# Patient Record
Sex: Female | Born: 1935 | Race: White | Hispanic: No | Smoking: Never smoker
Health system: Southern US, Community
[De-identification: ages and names within clinical notes are randomized; demographics above are authoritative.]

## PROBLEM LIST (undated history)

## (undated) DIAGNOSIS — R0989 Other specified symptoms and signs involving the circulatory and respiratory systems: Secondary | ICD-10-CM

## (undated) DIAGNOSIS — I4719 Other supraventricular tachycardia: Secondary | ICD-10-CM

## (undated) DIAGNOSIS — I5030 Unspecified diastolic (congestive) heart failure: Secondary | ICD-10-CM

## (undated) DIAGNOSIS — K219 Gastro-esophageal reflux disease without esophagitis: Secondary | ICD-10-CM

## (undated) DIAGNOSIS — E7800 Pure hypercholesterolemia, unspecified: Secondary | ICD-10-CM

## (undated) DIAGNOSIS — E039 Hypothyroidism, unspecified: Secondary | ICD-10-CM

## (undated) DIAGNOSIS — I509 Heart failure, unspecified: Secondary | ICD-10-CM

## (undated) DIAGNOSIS — I471 Supraventricular tachycardia: Secondary | ICD-10-CM

## (undated) DIAGNOSIS — I10 Essential (primary) hypertension: Secondary | ICD-10-CM

## (undated) DIAGNOSIS — H9190 Unspecified hearing loss, unspecified ear: Secondary | ICD-10-CM

## (undated) DIAGNOSIS — IMO0001 Reserved for inherently not codable concepts without codable children: Secondary | ICD-10-CM

## (undated) DIAGNOSIS — I739 Peripheral vascular disease, unspecified: Secondary | ICD-10-CM

## (undated) DIAGNOSIS — I495 Sick sinus syndrome: Secondary | ICD-10-CM

## (undated) HISTORY — PX: TOTAL KNEE ARTHROPLASTY: SHX125

## (undated) HISTORY — DX: Hypothyroidism, unspecified: E03.9

## (undated) HISTORY — PX: OTHER SURGICAL HISTORY: SHX169

## (undated) HISTORY — DX: Gastro-esophageal reflux disease without esophagitis: K21.9

## (undated) HISTORY — DX: Peripheral vascular disease, unspecified: I73.9

## (undated) HISTORY — DX: Pure hypercholesterolemia, unspecified: E78.00

## (undated) HISTORY — DX: Supraventricular tachycardia: I47.1

## (undated) HISTORY — DX: Reserved for inherently not codable concepts without codable children: IMO0001

## (undated) HISTORY — DX: Unspecified diastolic (congestive) heart failure: I50.30

## (undated) HISTORY — DX: Other supraventricular tachycardia: I47.19

## (undated) HISTORY — DX: Other specified symptoms and signs involving the circulatory and respiratory systems: R09.89

## (undated) HISTORY — DX: Essential (primary) hypertension: I10

## (undated) HISTORY — DX: Unspecified hearing loss, unspecified ear: H91.90

---

## 1998-01-31 ENCOUNTER — Other Ambulatory Visit: Admission: RE | Admit: 1998-01-31 | Discharge: 1998-01-31 | Payer: Self-pay | Admitting: Gynecology

## 1999-07-09 ENCOUNTER — Other Ambulatory Visit: Admission: RE | Admit: 1999-07-09 | Discharge: 1999-07-09 | Payer: Self-pay | Admitting: Gynecology

## 1999-12-31 ENCOUNTER — Other Ambulatory Visit: Admission: RE | Admit: 1999-12-31 | Discharge: 1999-12-31 | Payer: Self-pay | Admitting: Family Medicine

## 2000-03-18 ENCOUNTER — Encounter: Payer: Self-pay | Admitting: Family Medicine

## 2000-03-18 ENCOUNTER — Encounter: Admission: RE | Admit: 2000-03-18 | Discharge: 2000-03-18 | Payer: Self-pay | Admitting: Family Medicine

## 2000-05-30 ENCOUNTER — Ambulatory Visit (HOSPITAL_COMMUNITY): Admission: RE | Admit: 2000-05-30 | Discharge: 2000-05-30 | Payer: Self-pay | Admitting: Gynecology

## 2000-05-30 ENCOUNTER — Encounter (INDEPENDENT_AMBULATORY_CARE_PROVIDER_SITE_OTHER): Payer: Self-pay | Admitting: Specialist

## 2000-11-18 ENCOUNTER — Other Ambulatory Visit: Admission: RE | Admit: 2000-11-18 | Discharge: 2000-11-18 | Payer: Self-pay | Admitting: Gynecology

## 2001-04-20 ENCOUNTER — Encounter: Admission: RE | Admit: 2001-04-20 | Discharge: 2001-04-20 | Payer: Self-pay | Admitting: Family Medicine

## 2001-04-20 ENCOUNTER — Encounter: Payer: Self-pay | Admitting: Family Medicine

## 2001-10-30 ENCOUNTER — Encounter: Admission: RE | Admit: 2001-10-30 | Discharge: 2001-10-30 | Payer: Self-pay | Admitting: Family Medicine

## 2001-10-30 ENCOUNTER — Encounter: Payer: Self-pay | Admitting: Family Medicine

## 2002-01-05 ENCOUNTER — Other Ambulatory Visit: Admission: RE | Admit: 2002-01-05 | Discharge: 2002-01-05 | Payer: Self-pay | Admitting: Gynecology

## 2002-12-29 ENCOUNTER — Other Ambulatory Visit: Admission: RE | Admit: 2002-12-29 | Discharge: 2002-12-29 | Payer: Self-pay | Admitting: Gynecology

## 2004-09-27 ENCOUNTER — Inpatient Hospital Stay (HOSPITAL_COMMUNITY): Admission: EM | Admit: 2004-09-27 | Discharge: 2004-09-28 | Payer: Self-pay | Admitting: Emergency Medicine

## 2004-10-29 ENCOUNTER — Encounter: Admission: RE | Admit: 2004-10-29 | Discharge: 2004-10-29 | Payer: Self-pay | Admitting: Family Medicine

## 2005-01-02 ENCOUNTER — Other Ambulatory Visit: Admission: RE | Admit: 2005-01-02 | Discharge: 2005-01-02 | Payer: Self-pay | Admitting: Gynecology

## 2006-01-21 ENCOUNTER — Encounter: Admission: RE | Admit: 2006-01-21 | Discharge: 2006-01-21 | Payer: Self-pay | Admitting: Family Medicine

## 2006-02-05 ENCOUNTER — Other Ambulatory Visit: Admission: RE | Admit: 2006-02-05 | Discharge: 2006-02-05 | Payer: Self-pay | Admitting: Gynecology

## 2006-07-15 ENCOUNTER — Encounter: Payer: Self-pay | Admitting: Vascular Surgery

## 2006-07-15 ENCOUNTER — Ambulatory Visit (HOSPITAL_COMMUNITY): Admission: RE | Admit: 2006-07-15 | Discharge: 2006-07-15 | Payer: Self-pay | Admitting: Family Medicine

## 2007-01-23 ENCOUNTER — Encounter: Admission: RE | Admit: 2007-01-23 | Discharge: 2007-01-23 | Payer: Self-pay | Admitting: Family Medicine

## 2007-02-06 ENCOUNTER — Encounter: Admission: RE | Admit: 2007-02-06 | Discharge: 2007-02-06 | Payer: Self-pay | Admitting: Family Medicine

## 2007-02-25 ENCOUNTER — Other Ambulatory Visit: Admission: RE | Admit: 2007-02-25 | Discharge: 2007-02-25 | Payer: Self-pay | Admitting: Gynecology

## 2008-03-18 ENCOUNTER — Encounter: Admission: RE | Admit: 2008-03-18 | Discharge: 2008-03-18 | Payer: Self-pay | Admitting: Family Medicine

## 2008-07-12 ENCOUNTER — Ambulatory Visit: Payer: Self-pay | Admitting: Vascular Surgery

## 2009-10-13 ENCOUNTER — Ambulatory Visit: Payer: Self-pay | Admitting: Vascular Surgery

## 2010-09-07 ENCOUNTER — Ambulatory Visit: Payer: Self-pay | Admitting: Cardiology

## 2011-01-10 ENCOUNTER — Encounter: Payer: Self-pay | Admitting: Cardiology

## 2011-01-10 DIAGNOSIS — R0989 Other specified symptoms and signs involving the circulatory and respiratory systems: Secondary | ICD-10-CM | POA: Insufficient documentation

## 2011-01-10 DIAGNOSIS — E119 Type 2 diabetes mellitus without complications: Secondary | ICD-10-CM | POA: Insufficient documentation

## 2011-01-10 DIAGNOSIS — E039 Hypothyroidism, unspecified: Secondary | ICD-10-CM | POA: Insufficient documentation

## 2011-01-10 DIAGNOSIS — K219 Gastro-esophageal reflux disease without esophagitis: Secondary | ICD-10-CM | POA: Insufficient documentation

## 2011-01-10 DIAGNOSIS — I10 Essential (primary) hypertension: Secondary | ICD-10-CM | POA: Insufficient documentation

## 2011-01-10 DIAGNOSIS — E7800 Pure hypercholesterolemia, unspecified: Secondary | ICD-10-CM | POA: Insufficient documentation

## 2011-01-10 DIAGNOSIS — H409 Unspecified glaucoma: Secondary | ICD-10-CM | POA: Insufficient documentation

## 2011-01-10 DIAGNOSIS — H9190 Unspecified hearing loss, unspecified ear: Secondary | ICD-10-CM | POA: Insufficient documentation

## 2011-01-13 ENCOUNTER — Encounter: Payer: Self-pay | Admitting: Family Medicine

## 2011-01-25 ENCOUNTER — Other Ambulatory Visit: Payer: Self-pay | Admitting: Family Medicine

## 2011-01-25 DIAGNOSIS — Z780 Asymptomatic menopausal state: Secondary | ICD-10-CM

## 2011-01-25 DIAGNOSIS — Z1239 Encounter for other screening for malignant neoplasm of breast: Secondary | ICD-10-CM

## 2011-01-28 ENCOUNTER — Ambulatory Visit
Admission: RE | Admit: 2011-01-28 | Discharge: 2011-01-28 | Disposition: A | Discharge status: Home / Self Care | Payer: MEDICARE | Source: Ambulatory Visit | Attending: Family Medicine | Admitting: Family Medicine

## 2011-01-28 DIAGNOSIS — Z1239 Encounter for other screening for malignant neoplasm of breast: Secondary | ICD-10-CM

## 2011-01-28 DIAGNOSIS — Z780 Asymptomatic menopausal state: Secondary | ICD-10-CM

## 2011-03-15 ENCOUNTER — Encounter: Payer: Self-pay | Admitting: Cardiology

## 2011-03-15 ENCOUNTER — Ambulatory Visit (INDEPENDENT_AMBULATORY_CARE_PROVIDER_SITE_OTHER): Payer: MEDICARE | Admitting: Cardiology

## 2011-03-15 DIAGNOSIS — E119 Type 2 diabetes mellitus without complications: Secondary | ICD-10-CM

## 2011-03-15 DIAGNOSIS — I10 Essential (primary) hypertension: Secondary | ICD-10-CM

## 2011-03-15 DIAGNOSIS — R0989 Other specified symptoms and signs involving the circulatory and respiratory systems: Secondary | ICD-10-CM

## 2011-03-15 DIAGNOSIS — E7800 Pure hypercholesterolemia, unspecified: Secondary | ICD-10-CM

## 2011-03-15 NOTE — Assessment & Plan Note (Signed)
Continue Lipitor therapy and a heart healthy diet.

## 2011-03-15 NOTE — Progress Notes (Signed)
HPI Veronica Young is seen for followup of her hypertension today. She brings extensive readings from home dating back to January which demonstrated excellent blood pressure control with typical readings running from [id number] systolic. Diastolic readings have been between 53 and 64. Last month she states she could no longer afford Tekturna and she was switched to clonidine 0.1 mg b.i.d. She is tolerating this medication very wel lwithout side effects.. Allergies  Allergen Reactions  . Amlodipine     Current Outpatient Prescriptions on File Prior to Visit  Medication Sig Dispense Refill  . aspirin 81 MG tablet Take 81 mg by mouth daily.        [name] atorvastatin (LIPITOR) 40 MG tablet Take 40 mg by mouth daily.        . Calcium Carbonate-Vitamin D (CALCIUM 600 + D PO) Take 600 mg by mouth daily.        [name] latanoprost (XALATAN) 0.005 % ophthalmic solution 1 drop at bedtime.        [name] levothyroxine (SYNTHROID, LEVOTHROID) 50 MCG tablet Take 50 mcg by mouth daily.        . metoprolol (TOPROL-XL) 50 MG 24 hr tablet Take 50 mg by mouth daily.        . Multiple Vitamin (MULTIVITAMIN) tablet Take 1 tablet by mouth daily.        . valsartan-hydrochlorothiazide (DIOVAN-HCT) 320-25 MG per tablet Take 1 tablet by mouth daily.        [name] VITAMIN D, CHOLECALCIFEROL, PO Take 600 mg by mouth daily.        [name] DISCONTD: aliskiren (TEKTURNA) 300 MG tablet Take 300 mg by mouth daily.         Past Medical History  Diagnosis Date  . Hypertension   . Diabetes mellitus     type 2  . Hypercholesterolemia   . Deafness   . Carotid bruit   . GERD (gastroesophageal reflux disease)   . Glaucoma   . Hypothyroidism     Past Surgical History  Procedure Date  . Cholycystectomy     Family History  Problem Relation Age of Onset  . Heart disease Mother   . Liver cancer Father   . Heart disease Sister   . Heart disease Brother   . Stroke Sister     History   Social History  . Marital Status: Married    Spouse Name:  N/A    Number of Children: 4  . Years of Education: N/A   Occupational History  . Not on file.   Social History Main Topics  . Smoking status: Never Smoker   . Smokeless tobacco: Not on file  . Alcohol Use: No  . Drug Use: No  . Sexually Active: Not on file   Other Topics Concern  . Not on file   Social History Narrative  . No narrative on file    ROS The patient denies any heat or cold intolerance.  No weight gain or weight loss.  The patient denies headaches or blurry vision.  There is no cough or sputum production.  The patient denies dizziness.  There is no hematuria or hematochezia.  The patient denies any muscle aches or arthritis.  The patient denies any rash.  The patient denies frequent falling or instability.  There is no history of depression or anxiety.  All other systems were reviewed and are negative. PHYSICAL EXAM BP 170/88  Pulse 70  Ht 5\' 5"  (1.651 m)  Wt 151 lb 6.4 oz (68.675  kg)  BMI 25.19 kg/m2 The patient is alert and oriented x 3.  The mood and affect are normal.  The skin is warm and dry.  Color is normal.  The HEENT exam reveals that the sclera are nonicteric.  The mucous membranes are moist. She is deaf. The carotids are 2+ without bruits.  There is no thyromegaly.  There is no JVD.  The lungs are clear.  The chest wall is non tender.  The heart exam reveals a regular rate with a normal S1 and S2.  There are no murmurs, gallops, or rubs.  The PMI is not displaced.   Abdominal exam reveals good bowel sounds.  There is no guarding or rebound.  There is no hepatosplenomegaly or tenderness.  There are no masses.  Exam of the legs reveal no clubbing, cyanosis, or edema.  The legs are without rashes.  The distal pulses are intact.  Cranial nerves II - XII are intact.  Motor and sensory functions are intact.  The gait is normal. ASSESSMENT AND PLAN

## 2011-03-15 NOTE — Assessment & Plan Note (Signed)
Blood pressure readings at home have been excellent on her current medications. Her blood pressure is elevated today but she will continue to monitor this at home. She is tolerating clonidine very well.

## 2011-03-15 NOTE — Assessment & Plan Note (Signed)
Nonobstructive carotid disease by Doppler in 2010.

## 2011-05-07 NOTE — Assessment & Plan Note (Signed)
OFFICE VISIT   Veronica Young, Veronica Young  DOB:  [birth year]                                       07/12/2008  [id number]   I saw the patient in the office today for continued followup of her  peripheral vascular disease.  She had been seen in consultation by Dr.  Amedeo Plenty in August of 2007 at which time she has stable claudication and  evidence of bilateral superficial femoral artery occlusive disease.  She  was sent back today as she is continuing to have symptoms.  Of note, she  is deaf and it is slightly difficult to communicate to with her for this  reason.  She states that she has pain in both calves associated with  ambulation and relieved with rest.  Her symptoms have been fairly stable  over the last 6 months.  I cannot get any history of rest pain or any  history of nonhealing ulcers.   PAST MEDICAL HISTORY:  Significant for diabetes, hypertension,  hypercholesterolemia.   SOCIAL HISTORY:  She does not use tobacco.   REVIEW OF SYSTEMS:  She has had no recent chest pain or chest pressure.  She does admit to some dyspnea on exertion.   PHYSICAL EXAMINATION:  This is a pleasant 33-year woman who appears her  stated age.  Neck is supple.  There is no cervical lymphadenopathy.  I  do not detect any carotid bruits.  Lungs are clear bilaterally to  auscultation.  Cardiac exam, she has a regular rate and rhythm.  Abdomen:  Soft and nontender.  She has normal femoral pulses.  I cannot  palpate popliteal or pedal pulses on either side.  She does have  monophasic Doppler signals in both feet.  She has no ischemic ulcers.  Both feet appear adequately perfused.   Doppler study in our office today shows an ABI of 43% on the right and  34% on the left.  Back in August of 2007, she was 44% on the right and  34% on the left.  There has been really no significant change since  August of 2007.   I explained today that we would generally would not consider  arteriography and intervention unless she developed rest pain or  nonhealing ulcer.  I will have her follow up in 1 year with Dr. Amedeo Plenty  and, at that time, she will have ABIs and carotid duplex.  She knows to  call sooner if she has problems.   Judeth Cornfield. Scot Dock, M.D.  Electronically Signed   CSD/MEDQ  D:  07/12/2008  T:  07/13/2008  Job:  [id number]   cc:   Nelda Severe. Juventino Slovak, M.D.

## 2011-05-07 NOTE — Procedures (Signed)
CAROTID DUPLEX EXAM   INDICATION:  Bruit.   HISTORY:  Diabetes:  No.  Cardiac:  No.  Hypertension:  Yes.  Smoking:  No.  Previous Surgery:  No.  CV History:  No.  Amaurosis Fugax No, Paresthesias No, Hemiparesis No                                       RIGHT             LEFT  Brachial systolic pressure:         185               180  Brachial Doppler waveforms:         Within normal limits                Within normal limits  Vertebral direction of flow:        Antegrade         Antegrade  DUPLEX VELOCITIES (cm/sec)  CCA peak systolic                   91                78  ECA peak systolic                   69                62  ICA peak systolic                   73                65  ICA end diastolic                   27                23  PLAQUE MORPHOLOGY:                  Homogeneous       Homogeneous  PLAQUE AMOUNT:                      Mild              Mild  PLAQUE LOCATION:                    ICA               ICA   IMPRESSION:  1. The bilateral internal carotid arteries suggest 1%-39% stenosis.  2. Antegrade flow in bilateral vertebrals.   ___________________________________________  Rosetta Posner, M.D.   CB/MEDQ  D:  10/13/2009  T:  10/13/2009  Job:  [id number]

## 2011-05-10 NOTE — Cardiovascular Report (Signed)
NAME:  Veronica Young, Veronica Young NO.:  [id number]   MEDICAL RECORD NO.:  [id number]          PATIENT TYPE:  INP   LOCATION:  50                         FACILITY:  [facility]   PHYSICIAN:  Ethelle Lyon, M.D. LHCDATE OF BIRTH:  1933/12/09   DATE OF PROCEDURE:  09/28/2004  DATE OF DISCHARGE:                              CARDIAC CATHETERIZATION   PROCEDURE:  Left heart catheterization, left ventriculography, coronary  angiography, abdominal aortography, Angio-Seal closure of the right common  femoral arteriotomy site.   INDICATION:  Ms. Gurecki is a 77 year old lady with cardiac risk factors of  hypertension and dyslipidemia who presents with two episodes of severe  nocturnal chest discomfort.  She was admitted to hospital and ruled out for  myocardial infarction.  Admission systolic blood pressure was 201.  She has  remained hypertensive throughout hospitalization.  She is referred for  diagnostic angiography with plan for abdominal aortography to exclude renal  artery stenosis.   PROCEDURAL TECHNIQUE:  Informed consent was obtained.  Under 1% lidocaine  local anesthesia, a 6 French sheath was placed in the right common femoral  artery using the modified Seldinger technique.  Diagnostic angiography and  ventriculography were performed using JL-4, JR-4, and pigtail catheters.  The pigtail catheter was then pulled back to the suprarenal abdominal aorta.  Abdominal aortography was performed by power injection.  The arteriotomy was  then closed using a 6 French Angio-Seal.  Complete hemostasis was obtained.  The patient was transferred to the holding room in stable condition having  tolerated the procedure well.   COMPLICATIONS:  None.   FINDINGS:  1.  LV:  173/19/33. EF greater than 70% without regional wall motion      abnormality.  2.  No aortic stenosis or mitral regurgitation.  3.  Left main:  Angiographically normal.  4.  LAD:  Moderate sized vessel giving rise to two  larger diagonals and two      small distal diagonals.  The mid LAD has a 20% stenosis after the first      diagonal and a 40% stenosis after the second diagonal.  5.  The circumflex is a moderate size vessel giving rise to a single obtuse      marginal.  The ostium of the circumflex has approximately 30% stenosis.      There is then a 50% stenosis just proximal to the take off of the      marginal.  6.  RCA.  Large, dominant vessel.  There are only minor luminal      irregularities.  7.  Abdominal aorta:  Mildly tortuous, but normal caliber.  No aneurysm and      no plaque.  There are single renal arteries bilaterally.  Both are      normal.  Celiac, SMA, and IMA are seen to be patent.   IMPRESSION/RECOMMENDATION:  Moderate, nonobstructive coronary disease.  I  suspect her chest discomfort may be related to her severe hypertension.  Will add Hydrochlorothiazide 12.5 per day and ask her to follow up with Dr.  Redmond Pulling for adjustment as necessary.  There  is no evidence of renal artery  stenosis.       WED/MEDQ  D:  09/28/2004  T:  09/28/2004  Job:  [id number]   cc:   Luna Kitchens. Redmond Pulling, M.D.  [address]  [place]  [place] [address]  Fax: [phone]

## 2011-05-10 NOTE — Op Note (Signed)
[facility]  Patient:    Veronica Young, Veronica Young                       MRN: [id number] Proc. Date: 05/30/00 Adm. Date:  [id number] Attending:  Oswaldo Done CC:         Selinda Orion, M.D. (2 copies)                           Operative Report  PREOPERATIVE DIAGNOSIS: 1. Abnormal uterine bleeding recurrent and unresponsive to conservative    therapy. 2. Saline infusion sonography suggesting polyp versus fibroids.  POSTOPERATIVE DIAGNOSIS:  Endometrial polyp and uterine leiomyomata, luminal resected.  PROCEDURE:  Resection of multiple fibroids and polyps from endometrial cavity, total endometrial resection for oblation, plus vapotrode.  SURGEON:  Selinda Orion, M.D.  ANESTHESIA:  IV sedation and paracervical block.  DESCRIPTION OF PROCEDURE:  Under excellent anesthesia as above with the patient prepped and draped in [place], a speculum was placed in the vagina and the cervix grasped with a single tooth tenaculum. It was then progressively dilated with a series of Pratt dilators to accommodate a 7 mm resectoscope. The endometrial cavity was then surveyed and photos made of endometrial polyp and a broad sessile endometrial polyp just inside the internal os and 2 leiomyomata on the anterior wall of the uterus, the largest near the fundus. The uterine leiomyomata were then resected progressively until the fibroids were removed. The broad sessile polyp was then also resected to the basal myometrium and into the myometrial tissue for a distance of approximately 3 mm. The entire endometrial cavity was then systemically resected 3-5 mm depth into the endometrial tissue until no further gland openings could be seen. At this point, bleeding was well controlled. The vapotrode was then applied to the resectoscope and the entire endometrial cavity treated by vapotrode so as to eliminate any islands of viable endometrial tissue. At this point, the  procedure was terminated without complication. No significant bleeding at reduced pressure. Insignificant fluid deficit. Complications none. DD:  05/30/00 TD:  05/30/00 Job: [id number]

## 2011-05-10 NOTE — Discharge Summary (Signed)
NAME:  Veronica Young, Veronica Young NO.:  [id number]   MEDICAL RECORD NO.:  [id number]          PATIENT TYPE:  INP   LOCATION:  [id number]                         FACILITY:  [facility]   PHYSICIAN:  Dola Argyle, M.D.     DATE OF BIRTH:  07-01-1933   DATE OF ADMISSION:  09/27/2004  DATE OF DISCHARGE:  09/28/2004                           DISCHARGE SUMMARY - REFERRING   PROCEDURE:  Cardiac catheterization October 7th.   REASON FOR ADMISSION:  Veronica Young is a 77 year old female, with no prior  history of heart disease but with multiple cardiac risk factors notable for  longstanding hypertension, dyslipidemia, and family history, who presented  to the emergency room with symptoms worrisome for unstable angina pectoris.  Please refer to the dictated note for full details.   LABORATORY DATA:  Cardiac enzymes notable for normal total CPK with initial  MB marginally elevated (4.6), but follow up serial cardiac markers normal;  all troponin-I markers normal.  Lipid profile:  Total cholesterol 150,  triglycerides 172, HDL 31, LDL 85.  TSH 3.6.  Digoxin 1.6.  Sodium 139,  potassium 3.5, glucose 113.  Normal liver enzymes.  Normal CBC.   Admission CXR:  Cardiomegaly; no infiltrate or congestive heart failure.   HOSPITAL COURSE:  The patient was placed on aspirin, Lopressor, intravenous  nitroglycerin, and heparin for management of unstable angina pectoris.  Pindolol was discontinued on admission given the addition of Lopressor.   Recommendation was to proceed with diagnostic coronary angiography and this  was arranged for the following morning.  The procedure was performed by Dr.  Sabino Snipes (see report for full details), revealing nonobstructive  coronary artery disease with normal left ventricular function.  Distal  aortography also revealed normal renal arteries.   Dr. Albertine Patricia concluded that the patient's chest pain could be secondary to  severe hypertension.  He recommended the addition  of low-dose  hydrochlorothiazide.   MEDICATION ADJUSTMENTS THIS ADMISSION:  Addition of low-dose  hydrochlorothiazide 12.5 mg daily, Toprol 50 mg daily.   Prior to discharge, the patient was noticed to have a bruit on examination  of the right groin; however, a follow-up duplex scan revealed no evidence of  pseudoaneurysm or AV fistula.  The patient was thus discharged home.   MEDICATIONS AT DISCHARGE:  1.  Toprol-XL 50 mg daily (new).  2.  Hydrochlorothiazide 12.5 mg daily (new).  3.  Baby aspirin 81 mg daily.  4.  Lanoxin 0.125 mg daily.  5.  Lipitor 10 mg daily.  6.  Nitrostat 0.4 mg p.r.n. instructions.  7.  Stop pindolol.   No heavy lifting (greater then 10 pounds) x1 week; maintain low  fat/cholesterol diet; call the office if there is any swelling/bleeding of  the groin.   The patient is instructed to follow up with Dr. Sherolyn Buba next week.   DISCHARGE DIAGNOSES:  1.  Nonischemic chest pain.      1.  Normal serial cardiac markers.      2.  Nonobstructive coronary artery disease by cardiac catheterization          October 7th.  3.  Normal left ventricular function.  2.  Hypertension.  3.  Dyslipidemia.  4.  Borderline diabetes mellitus.  5.  Gastroesophageal reflux disease.       GS/MEDQ  D:  09/28/2004  T:  09/28/2004  Job:  [id number]   cc:   Luna Kitchens. Redmond Pulling, M.D.  [address]  [place]  [place] [address]  Fax: [phone]

## 2011-05-10 NOTE — H&P (Signed)
NAME:  Veronica Young, Veronica Young              ACCOUNT NO.:  [id number]   MEDICAL RECORD NO.:  [id number]                   PATIENT TYPE:  INP   LOCATION:  NA                                   FACILITY:  [facility]   PHYSICIAN:  Lockie Pares, M.D.                 DATE OF BIRTH:  11/24/1933   DATE OF ADMISSION:  09/29/2002  DATE OF DISCHARGE:                                HISTORY & PHYSICAL   CHIEF COMPLAINT:  Right knee pain.   HISTORY OF PRESENT ILLNESS:  The patient is a 77 year old white female  hearing impaired with a history of 1 year progressively worsening right knee  pain.  The patient states she has difficulty with activities such as bending  for gardening, up and down stairs, in and out of chairs and ambulation and  describes it as a deep aching sensation within the knee with no radiation.  She describes the knee getting stiff after short periods of rest.  She  denies any certain mechanical symptoms.  She does have swelling in the knee.  X-rays reveal end-stage osteoarthritis of the right knee.   ALLERGIES:  No known drug allergies.   CURRENT MEDICATIONS:  1. Zocor 20 mg p.o. q.d.  2. Lanoxin 0.25 mg p.o. q.d.  3. Pindolol 5 mg p.o. q.d.  4. Hydrochlorothiazide 12.5 mg p.o. q.d.  5. Norvasc 5 mg p.o. q.d.  6. CombiPatch 50/140 one patch a day.   PAST MEDICAL HISTORY:  1. Hypertension.  2. Palpitations.  3. Hypercholesterolemia.   PAST SURGICAL HISTORY:  1. Cholecystectomy in 1987.  2. Cyst removal from her left wrist in 1998.  3. The patient denies any complications with either surgical procedure.   SOCIAL HISTORY:  The patient is a healthy-appearing 77 year old white female  who denies any history of smoking or alcohol.  She is married, lives with  her husband in a one-story house, she has four children and she currently  works as a housewife.   FAMILY PHYSICIAN:  Dr. Sherolyn Buba.   FAMILY MEDICAL HISTORY:  Mother is deceased from heart complications  and  pneumonia.  Father is deceased from cancer.  The patient has got one brother  deceased from a suicide.  Six sisters deceased from a combination of MIs and  strokes.  One brother alive with hypertension and three sisters alive with  hypertension.   REVIEW OF SYSTEMS:  Positive for an upper partial plate with one tooth, she  does use glasses at all times, she does have occasional shortness of breath  with exertion but she denies any chest discomfort/diaphoresis.  All other  review of systems categories are negative and noncontributory.   PHYSICAL EXAMINATION:  VITALS: Pulse of 72 and regular, respirations 12,  temperature 97.9, blood pressure 152/92, height 5 feet 6 inches, weight 153  pounds.  GENERAL: The patient was a healthy-appearing white female.  She had a sign  language interpreter with her.  She did understand some  lip reading and some  verbal sounds.  She ambulated without any limp.  She was able to get on and  off the exam table without any difficulty but she must lead with her left  knee.  HEENT: Head was normocephalic.  Pupils equal, round, and reactive  accommodating to light;  extraocular movements intact; sclerae were not  icteric; conjunctivae pink and moist.  External ears were without  deformities; canals patent; TMs pearly gray; the patient was able to hear  some verbal sounds but she mostly lip read and sign language.  Nasal septum  was midline.  Mucous membranes pink and moist.  Oral buccal mucosa was pink  and moist without lesions; dentition was in good repair; uvula was midline;  the patient was able to swallow without any difficulty.  NECK: Supple; no palpable lymphadenopathy; thyroid gland was nontender.  The  patient had good range of motion of her cervical spine without any  difficulty or tenderness.  She has no tenderness along the entire spinous  process.  CHEST: Lung sounds were clear and equal bilaterally; no wheezes, rales,  rhonchi, or rubs  noted.  HEART: Regular rate, rhythm, S1 and S2 was auscultated; no murmurs, rubs, or  gallops noted.  ABDOMEN: Round, soft, nontender; no hepatosplenomegaly; CVA was nontender to  percussion; bowel sounds were normoactive throughout.  UPPER EXTREMITIES: Upper extremities were symmetrically sized and shaped.  She had good range of motion of her shoulders, elbows, and wrists without  any difficulty.  Motor strength was 5/5.  LOWER EXTREMITIES: Right and left hip had full extension-flexion up to 120  degrees with 20 degrees internal-external rotation without any difficulty or  tenderness.  Right knee was round, boggy appearing; no sign of erythema; she  had no palpable effusion; she was tender along the lateral joint line more  than the medial; she had a moderate amount of crepitus on the patella with  full extension and flexion back to 110 degrees; she had a 10-degree valgus  deformity.  Left knee was without any signs of erythema, no obvious  swelling, no palpable effusion; she was nontender along the mediolateral  joint line; range of motion was full without any valgus-varus laxity; no  anterior or posterior drawer; the calves were nontender, ankles were  symmetrical with good dorsi plantar flexion.  PERIPHERAL VASCULATURE: Carotid pulses were 2+, no bruits, radial pulses 2+,  dorsalis pedis and posterior tibial pulses were 1+.  She had no lower  extremity edema or venous stasis changes.  NEUROLOGIC: The patient was conscious, alert, and appropriate, ill at ease  in conversation with examiner through lip reading and sign language.  Cranial nerves were grossly intact with the exception of hearing which was  significantly impaired.  Deep tendon reflexes of the upper and lower  extremities were symmetrical.  She was grossly intact to light touch  sensation.  BREAST/RECTAL/GU: Deferred at this time.   IMPRESSION: 1. End-stage osteoarthritis right knee.  2. Hearing impaired.  3.  Hypertension.  4. Palpitations.  5. Hypercholesterolemia.    PLAN:  The patient will be admitted to [facility] under the care of  Dr. Earlie Server on September 29, 2002.  The patient will undergo all routine  labs and tests prior to having a right total knee arthroplasty.     Evert Kohl, Alisa Graff, M.D.  RWK/MEDQ  D:  09/23/2002  T:  09/27/2002  Job:  [id number]

## 2011-05-10 NOTE — H&P (Signed)
NAME:  Veronica Young, OUTHOUSE NO.:  [id number]   MEDICAL RECORD NO.:  [id number]          PATIENT TYPE:  EMS   LOCATION:  [facility]                         FACILITY:  [facility]   PHYSICIAN:  Dola Argyle, M.D.     DATE OF BIRTH:  [birth year]   DATE OF ADMISSION:  09/27/2004  DATE OF DISCHARGE:                                HISTORY & PHYSICAL   REASON FOR ADMISSION:  Ms. Mcowen is a pleasant 77 year old female with a  reportedly normal cardiac catheterization by Dr. Eustace Quail some 20 years  ago, with cardiac risk factors notable for long-standing hypertension,  borderline diabetes mellitus, family history, and age.   The patient presents with a consecutive 2-day history of nocturnal angina  pectoris, which typically awakens her around 3 a.m.  She describes this as a  burning sensation, similar to her reflux symptoms.  However, she has never  had 2 consecutive days of this discomfort, and her daughter recommended  medical evaluation.  Her symptoms are not associated with any radiation to  the jaw or to the upper extremities, but there is radiation to the mid-  scapular region.  There was also no associated diaphoresis, dyspnea, or  nausea.  The patient did not take anything for her discomfort, went back to  bed on each occasion, and arose in the morning with no further pain.  In  fact, she denies any recent history of developing exertional chest  discomfort or dyspnea.   On admission, electrocardiogram reveals normal sinus rhythm with down-  sloping ST depression inferolaterally.  The changes appear a little more  pronounced than those seen previously in 2003.   The patient currently is asymptomatic, denying any chest pain.  Her blood  pressure, however, was markedly elevated on admission with a systolic of  123456.   ALLERGIES:  No known drug allergies.   MEDICATIONS PRIOR TO ADMISSION:  1.  Lanoxin 0.125 mg daily.  2.  Lipitor 10 mg daily.  3.  Pindolol 5 mg daily.   The  patient reports having taken her medications this morning.   PAST MEDICAL HISTORY:  1.  Long-standing hypertension.  2.  Hypercholesterolemia.  3.  Borderline diabetes mellitus.  4.  The patient is hard of hearing.  5.  Status post cholecystectomy.   SOCIAL HISTORY:  The patient lives here in [place] with her husband, who  is profoundly deaf.  They communicate by sign language.  The patient has 4  children and 8 grandchildren.  She has never smoked tobacco.  She denies  alcohol use.   FAMILY HISTORY:  Both her parents are deceased, with no known history of  coronary disease.  She has 2 sisters, both of whom have coronary artery  disease, and have undergone percutaneous intervention.   REVIEW OF SYSTEMS:  Denies any prior history of myocardial infarction,  congestive heart failure, or stroke.  Has reflux disease with intermittent  heartburn.  Denies any recent evidence of upper or lower GI bleeding.  Denies any history of exertional chest discomfort, dyspnea, orthopnea, or  PND.  Has occasional pedal edema.  Denies palpitations.  The remaining  systems are negative.   PHYSICAL EXAMINATION:  VITAL SIGNS:  Blood pressure 201/93 on admission,  pulse 68 and regular, respirations 20, temperature 97.9.  SaO2 of 98% on  room air.  GENERAL:  A 77 year old female in no apparent distress.  HEENT:  Normocephalic and atraumatic.  NECK:  Preserved bilateral carotid pulse but no bruits.  LUNGS:  Clear to auscultation in all fields.  HEART:  Regular rate and rhythm (S1 and S2).  No murmurs, rubs, or gallops.  ABDOMEN:  Soft, nontender.  Intact bowel sounds.  EXTREMITIES:  Preserved bilateral femoral pulses but no bruits.  Preserved  right dorsalis pedis pulse with diminished left dorsalis pedis pulse, trace  to 1+ pedal edema.  NEUROLOGIC:  No focal deficit.   Chest x-ray and laboratory data pending.   ELECTROCARDIOGRAM:  Normal sinus rhythm at 73 beats per minute with normal  axis.  Q  wave in aVL.  Downsloping ST depression inferolaterally.   IMPRESSION:  1.  Angina pectoris.      1.  Nocturna.      2.  Reportedly normal coronary angiogram (approximately 20 years ago).  2.  Multiple cardiac risk factors.      1.  Long-standing hypertension.      2.  Borderline diabetes mellitus.      3.  Dyslipidemia.      4.  Family history.      5.  Age.  3.  Gastroesophageal reflux disease.   PLAN:  The patient will be admitted to telemetry for rule out of myocardial  infarction and further diagnostic evaluation.  She will be started on  aspirin and Lopressor here in the emergency room, as well as intravenous  nitroglycerin for improved blood pressure control.  Once her blood pressure  stabilizes, we will initiate intravenous heparin.  Pindolol will be  discontinued in place of Lopressor.  We will add a proton pump inhibitor, as  well.   RECOMMENDATIONS:  Proceed with diagnostic coronary angiography, and this  will be scheduled for tomorrow.  The patient is agreeable with this plan,  and is willing to proceed.  Risks and benefits of the procedure were  discussed, and the patient is agreeable to proceed.       GS/MEDQ  D:  09/27/2004  T:  09/27/2004  Job:  [id number]   cc:   Luna Kitchens. Redmond Pulling, M.D.  [address]  [place]  [place] [address]  Fax: [phone]

## 2011-06-14 ENCOUNTER — Encounter: Payer: Self-pay | Admitting: Cardiology

## 2011-06-14 ENCOUNTER — Telehealth: Payer: Self-pay | Admitting: Cardiology

## 2011-06-14 NOTE — Telephone Encounter (Signed)
Fax [phone] OV, EKG, Cardiac Testing

## 2011-06-19 ENCOUNTER — Other Ambulatory Visit: Payer: Self-pay | Admitting: Orthopedic Surgery

## 2011-06-19 ENCOUNTER — Other Ambulatory Visit (HOSPITAL_COMMUNITY): Payer: Self-pay | Admitting: Orthopedic Surgery

## 2011-06-19 ENCOUNTER — Encounter (HOSPITAL_COMMUNITY): Payer: Medicare Other

## 2011-06-19 ENCOUNTER — Ambulatory Visit (HOSPITAL_COMMUNITY)
Admission: RE | Admit: 2011-06-19 | Discharge: 2011-06-19 | Disposition: A | Discharge status: Home / Self Care | Payer: Medicare Other | Source: Ambulatory Visit | Attending: Orthopedic Surgery | Admitting: Orthopedic Surgery

## 2011-06-19 DIAGNOSIS — Z01818 Encounter for other preprocedural examination: Secondary | ICD-10-CM | POA: Insufficient documentation

## 2011-06-19 DIAGNOSIS — Z79899 Other long term (current) drug therapy: Secondary | ICD-10-CM | POA: Insufficient documentation

## 2011-06-19 DIAGNOSIS — Z01811 Encounter for preprocedural respiratory examination: Secondary | ICD-10-CM

## 2011-06-19 DIAGNOSIS — M1710 Unilateral primary osteoarthritis, unspecified knee: Secondary | ICD-10-CM | POA: Insufficient documentation

## 2011-06-19 DIAGNOSIS — Z01810 Encounter for preprocedural cardiovascular examination: Secondary | ICD-10-CM | POA: Insufficient documentation

## 2011-06-19 DIAGNOSIS — Z01812 Encounter for preprocedural laboratory examination: Secondary | ICD-10-CM | POA: Insufficient documentation

## 2011-06-19 DIAGNOSIS — E119 Type 2 diabetes mellitus without complications: Secondary | ICD-10-CM | POA: Insufficient documentation

## 2011-06-19 DIAGNOSIS — Z7982 Long term (current) use of aspirin: Secondary | ICD-10-CM | POA: Insufficient documentation

## 2011-06-19 DIAGNOSIS — I10 Essential (primary) hypertension: Secondary | ICD-10-CM | POA: Insufficient documentation

## 2011-06-19 LAB — CBC
HCT: 42.2 % (ref 36.0–46.0)
MCH: 29.6 pg (ref 26.0–34.0)
MCHC: 32.9 g/dL (ref 30.0–36.0)
Platelets: 221 10*3/uL (ref 150–400)
RDW: 12.8 % (ref 11.5–15.5)
WBC: 8.9 10*3/uL (ref 4.0–10.5)

## 2011-06-19 LAB — SURGICAL PCR SCREEN: Staphylococcus aureus: NEGATIVE

## 2011-06-19 LAB — COMPREHENSIVE METABOLIC PANEL
ALT: 18 U/L (ref 0–35)
AST: 17 U/L (ref 0–37)
Alkaline Phosphatase: 89 U/L (ref 39–117)
BUN: 24 mg/dL — ABNORMAL HIGH (ref 6–23)
Creatinine, Ser: 0.86 mg/dL (ref 0.50–1.10)
GFR calc Af Amer: >60 mL/min (ref >60)
Glucose, Bld: 85 mg/dL (ref 70–99)
Total Protein: 7 g/dL (ref 6.0–8.3)

## 2011-06-19 LAB — DIFFERENTIAL
Basophils Absolute: 0.1 10*3/uL (ref 0.0–0.1)
Basophils Relative: 1 % (ref 0–1)
Eosinophils Absolute: 0.2 10*3/uL (ref 0.0–0.7)
Lymphs Abs: 1.5 10*3/uL (ref 0.7–4.0)
Monocytes Absolute: 0.7 10*3/uL (ref 0.1–1.0)
Neutrophils Relative %: 74 % (ref 43–77)

## 2011-06-19 LAB — URINALYSIS, ROUTINE W REFLEX MICROSCOPIC
Glucose, UA: NEGATIVE mg/dL
Hgb urine dipstick: NEGATIVE
Leukocytes, UA: NEGATIVE
Nitrite: NEGATIVE
Protein, ur: NEGATIVE mg/dL
Specific Gravity, Urine: 1.017 (ref 1.005–1.030)
Urobilinogen, UA: 0.2 mg/dL (ref 0.0–1.0)
pH: 5.5 (ref 5.0–8.0)

## 2011-06-19 NOTE — H&P (Addendum)
NAMEMarland Kitchen  Veronica Young, Veronica Young NO.:  [id number]  MEDICAL RECORD NO.:  [id number]  LOCATION:                                 FACILITY:  PHYSICIAN:  Kipp Brood. Ireoluwa Grant, M.D.DATE OF BIRTH:  [birth year]  DATE OF ADMISSION: DATE OF DISCHARGE:                             HISTORY & PHYSICAL   CHIEF COMPLAINT:  Right knee pain.  BRIEF HISTORY:  Veronica Young came in to see Dr. Gladstone Lighter early in May about worsening pain in her right knee.  She states that the pain has been progressing over the last few months.  She is having feeling as though the knee is going to give out on her and it is very painful to bear weight and even it bothers her at rest at this point.  Veronica Young is deaf, she is able to read lips and she brings a family member with her to act as a Optometrist.  Veronica Young has tried anti-inflammatory and cortisone injection both without relief.  She now presents for right total knee arthroplasty.  Veronica Young has been cleared for surgery by her cardiologist, Dr. Peter [name].  Veronica Young has no medication allergies.  CURRENT MEDICATIONS: 1. Citalopram. 2. Bayer aspirin, which she has discontinued. 3. Clonidine 4. Diovan. 5. Levothyroxine. 6. Metformin. 7. Atorvastatin.  PAST MEDICAL HISTORY:  Includes: 1. End-stage arthritis of the right knee. 2. Glaucoma. 3. Hypertension.  PAST SURGICAL HISTORY:  Cholecystectomy in 1986 and she did well with anesthesia.  FAMILY HISTORY:  Father passed at age of 30, had colon cancer.  Mother passed at age of 94, she had Alzheimer's.  SOCIAL HISTORY:  The patient is widowed.  She denies past or present use of alcohol or tobacco products.  She has 4 children, one of whom will be living with her after she does plan to go home following her hospital stay.  REVIEW OF SYSTEMS:  GENERAL:  Negative for fevers, chills or weight change.  HEENT/NEURO:  Negative for headache, blurred vision or insomnia.  DERMATOLOGIC:  Negative for rash or  lesion.  RESPIRATORY: Negative for shortness of breath at rest or with exertion. CARDIOVASCULAR:  Negative for chest pain or palpitations.  GI:  Negative for nausea, vomiting, or diarrhea.  GU:  Positive for urinating at nighttime.  Negative for hematuria or dysuria.  MUSCULOSKELETAL: Positive joint pain, joint swelling.  PHYSICAL EXAMINATION:  VITAL SIGNS:  Pulse 80, respirations 18, blood pressure 140/82 in the left arm. GENERAL:  Veronica Young is alert and oriented x3.  She is well developed, well nourished, in no apparent distress.  As noted previously, the patient is deaf and does have a Optometrist and she is able to read lips. She has a stated height of 5 feet 6 inches and a stated weight of 148 pounds. HEENT: Normocephalic, atraumatic.  Extraocular movements intact.  The patient wears glasses. NECK:  Supple.  Full range of motion without lymphadenopathy. CHEST:  Lungs are clear to auscultation bilaterally without wheezes, rhonchi or rales. HEART:  Regular rate and rhythm without murmur. ABDOMEN:  Bowel sounds present in all 4 quadrants. EXTREMITIES:  Right knee, she has a valgus deformity, positive effusion, and range  of motion is 15 to 120 degrees. SKIN:  Unremarkable. NEUROLOGIC:  Intact. PERIPHERAL VASCULAR:  Carotid pulses 2+ bilaterally without bruit.  RADIOGRAPHS:  AP and lateral views of the right knee reveal complete collapse of the medial joint line with patellofemoral involvement.  IMPRESSION:  End-stage arthritis of the right knee.  PLAN:  Right total knee arthroplasty to be performed by Dr. Gladstone Lighter.     Toniann Fail, PAC   ______________________________ Kipp Brood Gladstone Lighter, M.D.    LD/MEDQ  D:  06/19/2011  T:  06/19/2011  Job:  [id number]  cc:   Peter M. [name], M.D. Fax: [phone]  Electronically Signed by Toniann Fail  on 06/19/2011 03:27:26 PM Electronically Signed by Latanya Maudlin M.D. on 06/29/2011 08:19:41 AM

## 2011-06-20 ENCOUNTER — Encounter: Payer: Self-pay | Admitting: Cardiology

## 2011-06-21 ENCOUNTER — Ambulatory Visit (HOSPITAL_COMMUNITY)
Admission: RE | Admit: 2011-06-21 | Discharge: 2011-06-22 | Disposition: A | Discharge status: Home / Self Care | Payer: Medicare Other | Source: Ambulatory Visit | Attending: Orthopedic Surgery | Admitting: Orthopedic Surgery

## 2011-06-21 ENCOUNTER — Encounter: Payer: Self-pay | Admitting: Cardiology

## 2011-06-21 DIAGNOSIS — K219 Gastro-esophageal reflux disease without esophagitis: Secondary | ICD-10-CM | POA: Insufficient documentation

## 2011-06-21 DIAGNOSIS — E785 Hyperlipidemia, unspecified: Secondary | ICD-10-CM | POA: Insufficient documentation

## 2011-06-21 DIAGNOSIS — Z5309 Procedure and treatment not carried out because of other contraindication: Secondary | ICD-10-CM | POA: Insufficient documentation

## 2011-06-21 DIAGNOSIS — Z79899 Other long term (current) drug therapy: Secondary | ICD-10-CM | POA: Insufficient documentation

## 2011-06-21 DIAGNOSIS — I10 Essential (primary) hypertension: Secondary | ICD-10-CM | POA: Insufficient documentation

## 2011-06-21 DIAGNOSIS — H409 Unspecified glaucoma: Secondary | ICD-10-CM | POA: Insufficient documentation

## 2011-06-21 DIAGNOSIS — M1710 Unilateral primary osteoarthritis, unspecified knee: Principal | ICD-10-CM | POA: Insufficient documentation

## 2011-06-21 DIAGNOSIS — R002 Palpitations: Secondary | ICD-10-CM

## 2011-06-21 DIAGNOSIS — F3289 Other specified depressive episodes: Secondary | ICD-10-CM | POA: Insufficient documentation

## 2011-06-21 DIAGNOSIS — E039 Hypothyroidism, unspecified: Secondary | ICD-10-CM | POA: Insufficient documentation

## 2011-06-21 DIAGNOSIS — I517 Cardiomegaly: Secondary | ICD-10-CM

## 2011-06-21 DIAGNOSIS — I739 Peripheral vascular disease, unspecified: Secondary | ICD-10-CM | POA: Insufficient documentation

## 2011-06-21 DIAGNOSIS — F329 Major depressive disorder, single episode, unspecified: Secondary | ICD-10-CM | POA: Insufficient documentation

## 2011-06-21 DIAGNOSIS — I498 Other specified cardiac arrhythmias: Secondary | ICD-10-CM | POA: Insufficient documentation

## 2011-06-21 DIAGNOSIS — H9190 Unspecified hearing loss, unspecified ear: Secondary | ICD-10-CM | POA: Insufficient documentation

## 2011-06-21 DIAGNOSIS — Z7982 Long term (current) use of aspirin: Secondary | ICD-10-CM | POA: Insufficient documentation

## 2011-06-21 DIAGNOSIS — Z8249 Family history of ischemic heart disease and other diseases of the circulatory system: Secondary | ICD-10-CM | POA: Insufficient documentation

## 2011-06-21 DIAGNOSIS — E119 Type 2 diabetes mellitus without complications: Secondary | ICD-10-CM | POA: Insufficient documentation

## 2011-06-21 LAB — DIFFERENTIAL
Basophils Relative: 0 % (ref 0–1)
Eosinophils Absolute: 0.2 10*3/uL (ref 0.0–0.7)
Eosinophils Relative: 2 % (ref 0–5)
Lymphocytes Relative: 20 % (ref 12–46)
Neutro Abs: 5.4 10*3/uL (ref 1.7–7.7)
Neutrophils Relative %: 70 % (ref 43–77)

## 2011-06-21 LAB — GLUCOSE, CAPILLARY
Glucose-Capillary: 109 mg/dL — ABNORMAL HIGH (ref 70–99)
Glucose-Capillary: 138 mg/dL — ABNORMAL HIGH (ref 70–99)
Glucose-Capillary: 137 mg/dL — ABNORMAL HIGH (ref 70–99)

## 2011-06-21 LAB — COMPREHENSIVE METABOLIC PANEL
CO2: 29 mEq/L (ref 19–32)
Creatinine, Ser: 0.81 mg/dL (ref 0.50–1.10)
GFR calc Af Amer: >60 mL/min (ref >60)
Glucose, Bld: 116 mg/dL — ABNORMAL HIGH (ref 70–99)
Potassium: 3.5 mEq/L (ref 3.5–5.1)
Total Bilirubin: 0.6 mg/dL (ref 0.3–1.2)
Total Protein: 6.3 g/dL (ref 6.0–8.3)

## 2011-06-21 LAB — CBC
MCH: 30.3 pg (ref 26.0–34.0)
MCHC: 34.2 g/dL (ref 30.0–36.0)
Platelets: 185 10*3/uL (ref 150–400)
RBC: 4.55 MIL/uL (ref 3.87–5.11)
RDW: 12.5 % (ref 11.5–15.5)
WBC: 7.7 10*3/uL (ref 4.0–10.5)

## 2011-06-21 LAB — TSH: TSH: 0.818 u[IU]/mL (ref 0.350–4.500)

## 2011-06-21 NOTE — Telephone Encounter (Signed)
Per nurse at [facility] monitoring pt.  Pt on careizen with current HR is in the 40's. Please return call to advise nurse.

## 2011-06-21 NOTE — Telephone Encounter (Signed)
Nurse from hospital called about cardizem. Advised her to call Jefferson Davis PA or Trish. Dr. [name] is off this afternoon.

## 2011-06-22 DIAGNOSIS — I471 Supraventricular tachycardia: Secondary | ICD-10-CM

## 2011-06-22 LAB — GLUCOSE, CAPILLARY

## 2011-06-24 ENCOUNTER — Encounter: Payer: Self-pay | Admitting: Cardiology

## 2011-06-24 ENCOUNTER — Telehealth: Payer: Self-pay | Admitting: Cardiology

## 2011-06-24 NOTE — Telephone Encounter (Signed)
Patient has a question regarding her Benecar prescription.  She wanted to know why she was prescribed this medication because it makes her sick.

## 2011-06-24 NOTE — Telephone Encounter (Signed)
Lm thru relay w/ Dr. Doug Sou message. Will see Thursday.

## 2011-06-24 NOTE — Telephone Encounter (Signed)
She is not supposed to be on Diovan HCT. Not Benicar. This is a mistake. She was started on Toprol XL in addition to her prior meds. We are seeing Thurs. Dr. Gladstone Lighter wants [place] to call him Thurs. If she is cleared for surgery again. Collier Salina [name]

## 2011-06-24 NOTE — Telephone Encounter (Signed)
Spoke w/ Veronica Young thru relay. Called stating she was given Benicar when d/c from hospital. Can't take it because it makes her sick. States she was given this in addition to diovan. Has not started taking. Daughter picked up from pharm and Veronica Young didn't realize that it was Benicar until she got it home. BP today was 145/73. Is scheduled to see [place] in office 7/5. Will try to get in touch w/ Veronica Young at [facility] but not to take it now.

## 2011-06-27 ENCOUNTER — Encounter: Payer: Self-pay | Admitting: Cardiology

## 2011-06-27 ENCOUNTER — Ambulatory Visit (INDEPENDENT_AMBULATORY_CARE_PROVIDER_SITE_OTHER): Payer: Medicare Other | Admitting: Cardiology

## 2011-06-27 VITALS — BP 138/78 | HR 52 | Ht 66.0 in | Wt 150.4 lb

## 2011-06-27 DIAGNOSIS — I471 Supraventricular tachycardia: Secondary | ICD-10-CM

## 2011-06-27 NOTE — Assessment & Plan Note (Signed)
There is no clear reason why she developed this during her recent hospital stay. She has no prior history of arrhythmia. Her rhythm is well controlled now on beta blocker therapy. I recommended she continue metoprolol at this time. She is cleared to proceed with her and total knee replacement. Once she has completed her surgery we may consider tapering off the metoprolol since she is fatigued on this medication.

## 2011-06-27 NOTE — Progress Notes (Signed)
HPI Veronica Young is seen for followup of her recent hospitalization. She was recently planning to have total knee replacement but when she was taken into the operating room she developed  paroxysmal  atrial tachycardia. Her subsequent evaluation including cardiac enzymes and echocardiogram were normal. She was placed on a beta blocker and discharged home. On followup today she denies any recurrent palpitations. She's had no chest pain or shortness of breath. She does feel fatigued on the beta blocker. Allergies  Allergen Reactions  . Amlodipine   . Benicar (Olmesartan Medoxomil)     Current Outpatient Prescriptions on File Prior to Visit  Medication Sig Dispense Refill  . aspirin 81 MG tablet Take 81 mg by mouth daily.        [name] atorvastatin (LIPITOR) 40 MG tablet Take 40 mg by mouth daily.        . Calcium Carbonate-Vitamin D (CALCIUM 600 + D PO) Take 600 mg by mouth daily.        . citalopram (CELEXA) 20 MG tablet       . cloNIDine (CATAPRES) 0.1 MG tablet Take 1 tablet (0.1 mg total) by mouth 2 (two) times daily.  60 tablet  0  . dorzolamide-timolol (COSOPT) 22.3-6.8 MG/ML ophthalmic solution       . KLOR-CON 10 10 MEQ CR tablet Take 1 tablet by mouth Daily.      [name] latanoprost (XALATAN) 0.005 % ophthalmic solution 1 drop at bedtime.        [name] levothyroxine (SYNTHROID, LEVOTHROID) 50 MCG tablet Take 50 mcg by mouth daily.        . metFORMIN (GLUCOPHAGE) 500 MG tablet Take 500 mg by mouth 2 (two) times daily with a meal.       . metoprolol (TOPROL-XL) 50 MG 24 hr tablet Take 50 mg by mouth daily.        . valsartan-hydrochlorothiazide (DIOVAN-HCT) 320-25 MG per tablet Take 1 tablet by mouth daily.        [name] VITAMIN D, CHOLECALCIFEROL, PO Take 600 mg by mouth daily.        [name] DISCONTD: Multiple Vitamin (MULTIVITAMIN) tablet Take 1 tablet by mouth daily.          Past Medical History  Diagnosis Date  . Hypertension   . Diabetes mellitus     type 2  . Hypercholesterolemia   . Deafness   .  Carotid bruit   . GERD (gastroesophageal reflux disease)   . Glaucoma   . Hypothyroidism   . PAT (paroxysmal atrial tachycardia)     Past Surgical History  Procedure Date  . Cholycystectomy     Family History  Problem Relation Age of Onset  . Heart disease Mother   . Liver cancer Father   . Heart disease Sister   . Heart disease Brother   . Stroke Sister     History   Social History  . Marital Status: Married    Spouse Name: N/A    Number of Children: 4  . Years of Education: N/A   Occupational History  . Not on file.   Social History Main Topics  . Smoking status: Never Smoker   . Smokeless tobacco: Not on file  . Alcohol Use: No  . Drug Use: No  . Sexually Active: Not on file   Other Topics Concern  . Not on file   Social History Narrative  . No narrative on file    ROS  All other systems were reviewed and are  negative.  PHYSICAL EXAM BP 138/78  Pulse 52  Ht 5\' 6"  (1.676 m)  Wt 150 lb 6.4 oz (68.221 kg)  BMI 24.28 kg/m2 The patient is alert and oriented x 3.  The mood and affect are normal.  The skin is warm and dry.  Color is normal.  The HEENT exam reveals that the sclera are nonicteric.  The mucous membranes are moist. She is deaf. The carotids are 2+ without bruits.  There is no thyromegaly.  There is no JVD.  The lungs are clear.  The chest wall is non tender.  The heart exam reveals a regular rate with a normal S1 and S2.  There are no murmurs, gallops, or rubs.  The PMI is not displaced.   Abdominal exam reveals good bowel sounds.  There is no guarding or rebound.  There is no hepatosplenomegaly or tenderness.  There are no masses.  Exam of the legs reveal no clubbing, cyanosis, or edema.  The legs are without rashes.  The distal pulses are intact.  Cranial nerves II - XII are intact.  Motor and sensory functions are intact.  The gait is normal. ASSESSMENT AND PLAN

## 2011-06-27 NOTE — Patient Instructions (Signed)
I will call Dr. Gladstone Lighter and clear you again for surgery.  Continue the metoprolol for now but hopefully we can stop this later after your surgery if there is no more arrhythmia.

## 2011-06-28 ENCOUNTER — Telehealth: Payer: Self-pay | Admitting: Cardiology

## 2011-06-28 ENCOUNTER — Encounter: Payer: Self-pay | Admitting: Cardiology

## 2011-06-28 NOTE — Telephone Encounter (Signed)
Fax: [phone]

## 2011-07-03 ENCOUNTER — Other Ambulatory Visit: Payer: Self-pay | Admitting: Orthopedic Surgery

## 2011-07-03 ENCOUNTER — Encounter (HOSPITAL_COMMUNITY): Payer: Medicare Other

## 2011-07-03 LAB — URINALYSIS, ROUTINE W REFLEX MICROSCOPIC
Hgb urine dipstick: NEGATIVE
Nitrite: NEGATIVE
Specific Gravity, Urine: 1.012 (ref 1.005–1.030)
Urobilinogen, UA: 0.2 mg/dL (ref 0.0–1.0)
pH: 6.5 (ref 5.0–8.0)

## 2011-07-03 LAB — BASIC METABOLIC PANEL
Chloride: 89 mEq/L — ABNORMAL LOW (ref 96–112)
GFR calc Af Amer: >60 mL/min (ref >60)
GFR calc non Af Amer: >60 mL/min (ref >60)
Glucose, Bld: 91 mg/dL (ref 70–99)
Potassium: 4.1 mEq/L (ref 3.5–5.1)
Sodium: 125 mEq/L — ABNORMAL LOW (ref 135–145)

## 2011-07-03 LAB — TYPE AND SCREEN: Antibody Screen: NEGATIVE

## 2011-07-05 ENCOUNTER — Inpatient Hospital Stay (HOSPITAL_COMMUNITY)
Admission: RE | Admit: 2011-07-05 | Discharge: 2011-07-11 | DRG: 470 | Disposition: A | Discharge status: Skilled Nursing Facility | Payer: Medicare Other | Source: Ambulatory Visit | Attending: Orthopedic Surgery | Admitting: Orthopedic Surgery

## 2011-07-05 ENCOUNTER — Inpatient Hospital Stay (HOSPITAL_COMMUNITY): Payer: Medicare Other

## 2011-07-05 DIAGNOSIS — H913 Deaf nonspeaking, not elsewhere classified: Secondary | ICD-10-CM | POA: Diagnosis present

## 2011-07-05 DIAGNOSIS — E876 Hypokalemia: Secondary | ICD-10-CM | POA: Diagnosis not present

## 2011-07-05 DIAGNOSIS — I509 Heart failure, unspecified: Secondary | ICD-10-CM | POA: Diagnosis present

## 2011-07-05 DIAGNOSIS — E785 Hyperlipidemia, unspecified: Secondary | ICD-10-CM | POA: Diagnosis present

## 2011-07-05 DIAGNOSIS — K219 Gastro-esophageal reflux disease without esophagitis: Secondary | ICD-10-CM | POA: Diagnosis present

## 2011-07-05 DIAGNOSIS — M1710 Unilateral primary osteoarthritis, unspecified knee: Principal | ICD-10-CM | POA: Diagnosis present

## 2011-07-05 DIAGNOSIS — I5030 Unspecified diastolic (congestive) heart failure: Secondary | ICD-10-CM | POA: Diagnosis present

## 2011-07-05 DIAGNOSIS — E871 Hypo-osmolality and hyponatremia: Secondary | ICD-10-CM | POA: Diagnosis not present

## 2011-07-05 DIAGNOSIS — E039 Hypothyroidism, unspecified: Secondary | ICD-10-CM | POA: Diagnosis present

## 2011-07-05 DIAGNOSIS — H409 Unspecified glaucoma: Secondary | ICD-10-CM | POA: Diagnosis present

## 2011-07-05 DIAGNOSIS — I471 Supraventricular tachycardia: Secondary | ICD-10-CM

## 2011-07-05 DIAGNOSIS — I10 Essential (primary) hypertension: Secondary | ICD-10-CM | POA: Diagnosis present

## 2011-07-05 DIAGNOSIS — I739 Peripheral vascular disease, unspecified: Secondary | ICD-10-CM | POA: Diagnosis present

## 2011-07-05 DIAGNOSIS — I498 Other specified cardiac arrhythmias: Secondary | ICD-10-CM | POA: Diagnosis present

## 2011-07-05 DIAGNOSIS — E119 Type 2 diabetes mellitus without complications: Secondary | ICD-10-CM | POA: Diagnosis present

## 2011-07-05 DIAGNOSIS — R0902 Hypoxemia: Secondary | ICD-10-CM | POA: Diagnosis not present

## 2011-07-05 DIAGNOSIS — D649 Anemia, unspecified: Secondary | ICD-10-CM | POA: Diagnosis not present

## 2011-07-05 LAB — D-DIMER, QUANTITATIVE: D-Dimer, Quant: 13.53 ug/mL-FEU — ABNORMAL HIGH (ref 0.00–0.48)

## 2011-07-05 LAB — GLUCOSE, CAPILLARY
Glucose-Capillary: 122 mg/dL — ABNORMAL HIGH (ref 70–99)
Glucose-Capillary: 179 mg/dL — ABNORMAL HIGH (ref 70–99)

## 2011-07-05 LAB — CBC
HCT: 33.2 % — ABNORMAL LOW (ref 36.0–46.0)
Hemoglobin: 11.7 g/dL — ABNORMAL LOW (ref 12.0–15.0)
MCH: 30.5 pg (ref 26.0–34.0)
MCHC: 35.2 g/dL (ref 30.0–36.0)

## 2011-07-06 LAB — TSH: TSH: 1.389 u[IU]/mL (ref 0.350–4.500)

## 2011-07-06 LAB — COMPREHENSIVE METABOLIC PANEL
ALT: 243 U/L — ABNORMAL HIGH (ref 0–35)
AST: 230 U/L — ABNORMAL HIGH (ref 0–37)
Albumin: 2.8 g/dL — ABNORMAL LOW (ref 3.5–5.2)
Alkaline Phosphatase: 196 U/L — ABNORMAL HIGH (ref 39–117)
Chloride: 92 mEq/L — ABNORMAL LOW (ref 96–112)
Creatinine, Ser: 0.74 mg/dL (ref 0.50–1.10)
Potassium: 3.5 mEq/L (ref 3.5–5.1)
Sodium: 124 mEq/L — ABNORMAL LOW (ref 135–145)
Total Bilirubin: 1 mg/dL (ref 0.3–1.2)

## 2011-07-06 LAB — GLUCOSE, CAPILLARY
Glucose-Capillary: 140 mg/dL — ABNORMAL HIGH (ref 70–99)
Glucose-Capillary: 127 mg/dL — ABNORMAL HIGH (ref 70–99)
Glucose-Capillary: 220 mg/dL — ABNORMAL HIGH (ref 70–99)

## 2011-07-06 LAB — BASIC METABOLIC PANEL
BUN: 9 mg/dL (ref 6–23)
Chloride: 90 mEq/L — ABNORMAL LOW (ref 96–112)
GFR calc Af Amer: >60 mL/min (ref >60)
Potassium: 3 mEq/L — ABNORMAL LOW (ref 3.5–5.1)

## 2011-07-06 LAB — HEMATOCRIT: HCT: 32.3 % — ABNORMAL LOW (ref 36.0–46.0)

## 2011-07-07 ENCOUNTER — Inpatient Hospital Stay (HOSPITAL_COMMUNITY): Payer: Medicare Other

## 2011-07-07 DIAGNOSIS — R0602 Shortness of breath: Secondary | ICD-10-CM

## 2011-07-07 LAB — HEMOGLOBIN: Hemoglobin: 8.7 g/dL — ABNORMAL LOW (ref 12.0–15.0)

## 2011-07-07 LAB — BASIC METABOLIC PANEL
CO2: 26 mEq/L (ref 19–32)
Calcium: 7.8 mg/dL — ABNORMAL LOW (ref 8.4–10.5)
Chloride: 90 mEq/L — ABNORMAL LOW (ref 96–112)
Creatinine, Ser: 1.01 mg/dL (ref 0.50–1.10)
GFR calc Af Amer: >60 mL/min (ref >60)
Glucose, Bld: 124 mg/dL — ABNORMAL HIGH (ref 70–99)
Potassium: 3.5 mEq/L (ref 3.5–5.1)
Sodium: 122 mEq/L — ABNORMAL LOW (ref 135–145)

## 2011-07-07 LAB — GLUCOSE, CAPILLARY: Glucose-Capillary: 156 mg/dL — ABNORMAL HIGH (ref 70–99)

## 2011-07-07 LAB — HEMATOCRIT: HCT: 24.1 % — ABNORMAL LOW (ref 36.0–46.0)

## 2011-07-07 MED ORDER — IOHEXOL 300 MG/ML  SOLN
100.0000 mL | Freq: Once | INTRAMUSCULAR | Status: AC | PRN
  Administered 2011-07-07: 100 mL via INTRAVENOUS

## 2011-07-07 NOTE — Consult Note (Signed)
NAMEMarland Kitchen  GLANDA, COZAD NO.:  [id number]  MEDICAL RECORD NO.:  [id number]  LOCATION:  [facility]                         FACILITY:  [facility]  PHYSICIAN:  Ulice Bold, MD   DATE OF BIRTH:  Sep 08, 1933  DATE OF CONSULTATION: 07/07/2011                                CONSULTATION   PRIMARY CARDIOLOGIST:  Peter M. [name], M.D.  CHIEF COMPLAINT:  Hyponatremia.  HISTORY OF PRESENT ILLNESS:  The patient is a 77 year old white female with a past medical history of diabetes mellitus type 2, hypertension who was admitted for elective knee replacement.  History of present illness dates back to July 05, 2011.  The patient was admitted for right knee replacement.  The patient's intraoperative and postoperative course was uneventful, but on July 06, 2011, the patient developed hypoxia on the floor.  The patient desaturated into 70s and 80s.  The patient also had an onset of SVT.  The patient was transferred to ICU.  The patient had been on bradycardic side in the ICU.  Cardiology was consulted for the same.  In the ICU, the patient is still having occasional hypoxia when the oxygen is not on.  The patient herself is deaf.  The patient's daughter helped for the communication.  The patient offers no complaint of chest pain or shortness of breath.  No complaint of nausea, vomiting or diarrhea.  No complaint of syncope.  REVIEW OF SYSTEMS:  Negative.  ALLERGIES:  The patient has no known drug allergies.  SOCIAL HISTORY:  The patient lives with daughter.  She is nonsmoker and nondrinker.  FAMILY HISTORY:  Positive for hearing disorders.  PAST MEDICAL HISTORY:  Positive for: 1. SVT. 2. Coronary artery disease.  The patient had a coronary angiogram,     which showed nonobstructive disease. 3. Diabetes Mellitus type 2. 4. Hypertension. 5. Hyperlipidemia. 6. GERD. 7. Osteoarthritis. 8. PVD. 9. Glaucoma. 10.Hypothyroidism. 11.Status post knee replacement.  MEDICATIONS:  At  this time, the patient is on: 1. Coumadin. 2. The patient is also getting heparin for DVT prophylaxis. 3. Colace 100 mg p.o. b.i.d. 4. Iron 325 mg p.o. t.i.d. 5. Sliding scale insulin. 6. Metoprolol 50 mg p.o. daily. 7. Vitamin D 1000 units p.o. daily. 8. Levothyroxine 50 mcg p.o. daily. 9. Metformin 500 mg p.o. b.i.d. 10.Xalatan ophthalmic drops. 11.KCl 10 mEq p.o. daily. 12.Trusopt ophthalmic drops. 13.Timolol ophthalmic drops. 14.Olmesartan 40 mg p.o. q.h.s. 15.Hydrochlorothiazide 25 mg p.o. daily. 16.Clonidine 0.1 mg p.o. b.i.d. 17.Celexa 20 mg p.o. daily. 18.Calcium/vitamin D 1 tablet p.o. daily. 19.Crestor 20 mg p.o. daily. 20.Cardizem 30 mg p.o. q.6 h. 21.Normal saline.  The patient is getting at 100 mL per hour. 22.Methocarbamol 500 mg IV q.6 h p.r.n. 23.Ambien 5 mg q.6 h p.r.n.  PHYSICAL EXAMINATION:  VITAL SIGNS:  Blood pressure 112/38, pulse 62, respiratory rate 21, temperature afebrile. GENERAL:  The patient is awake, alert, oriented to time, place and person, well built, well nourished, responds appropriately.  HEENT:  The patient is not able to hear.  Pupils equally reactive to light accommodation.  Extraocular muscles are intact. RESPIRATORY:  No acute respiratory distress.  The patient does have crackles bilaterally. CARDIOVASCULAR:  S1 and S2.  Regular rate and rhythm.  No murmurs appreciated. GI:  Bowel sounds are present.  Abdomen is soft, nontender, nondistended. EXTREMITIES:  The patient does have 1+ extremity edema. CNS:  Cranial nerves II through XII are grossly intact.  No focal motor deficits. PSYCHIATRIC:  The patient has normal mood and affect.  LABORATORY DATA:  Sodium 120, potassium is 4, serum chloride 91, bicarb 26, BUN 14, serum creatinine 1, glucose 124.  The patient's hemoglobin is 8.7, hematocrit 24.1.  The patient's D-dimer is 13.5.  The patient's TSH is 1.3, calcium 7.8.  The patient's sodium trend has been 125 on July 03, 2011, 124  on July 05, 2011, 125 on July 06, 2011, and now 120. A CT chest was done, which showed no evidence of acute PE, but did show suspect mild CHF.  IMPRESSION: 1. Fluid electrolyte nutrition.  The patient is hyponatremic.     Etiology:  The patient is hyponatremic because of multiple factors,     (a) hydrochlorothiazide (b) SSRI  (c) Hypervolemia. 2. The patient is hypervolemic.  Questionable congestive heart failure. 3. Respiratory:  The patient is hypoxic.  This could be because of     multiple etiologies:     a.     Atelectasis.     b.     Congestive heart failure.     c.     Questionable deep venous thrombosis. 4. Deep venous thrombosis.  The patient's PE has been ruled out.  The     patient is on Coumadin and is a high-risk patient. 5. Cardiovascular:  The patient is hemodynamically stable. 6. Arrhythmia.  The patient is right now in normal sinus rhythm.     Cardiology is following. 7. Renal.  The patient is at high risk for acute kidney injury.  The     patient has diabetes mellitus, on metformin and got IV contrast.  PLAN: 1. We will discontinue metformin. 2. We will discontinue hydrochlorothiazide. 3. We will discontinue SSRIs. 4. We will discontinue IV fluids.5. We will give one dose of Lasix.  The reason I am not giving a     maintenance dose of Lasix is that for preventing contrast     nephropathy, we need IV fluids, but the patient is hypoxic, so we     will handle diligently on day-to-day basis depending on the     patient's volume status. 6. We will follow BMET q.12 hours for the next 24 hours and then daily     for the next 2-3 days to see this sodium trend . 7. Case has been discussed with Dr. Maxie Better. 8. We will get LE Doplers.   Thanks for allowing [place] to participate in Ms Sweney's care. Will follow the pt.     Ulice Bold, MD     MB/MEDQ  D:  07/07/2011  T:  07/07/2011  Job:  [id number]  Electronically Signed by Ulice Bold M.D. on 07/07/2011 04:35:33  PM

## 2011-07-08 LAB — BASIC METABOLIC PANEL
BUN: 13 mg/dL (ref 6–23)
CO2: 26 mEq/L (ref 19–32)
Calcium: 8.1 mg/dL — ABNORMAL LOW (ref 8.4–10.5)
Chloride: 88 mEq/L — ABNORMAL LOW (ref 96–112)
Creatinine, Ser: 0.91 mg/dL (ref 0.50–1.10)
GFR calc Af Amer: >60 mL/min (ref >60)
GFR calc non Af Amer: >60 mL/min (ref >60)
Glucose, Bld: 115 mg/dL — ABNORMAL HIGH (ref 70–99)
Potassium: 3.5 mEq/L (ref 3.5–5.1)
Sodium: 121 mEq/L — ABNORMAL LOW (ref 135–145)

## 2011-07-08 LAB — GLUCOSE, CAPILLARY
Glucose-Capillary: 133 mg/dL — ABNORMAL HIGH (ref 70–99)
Glucose-Capillary: 129 mg/dL — ABNORMAL HIGH (ref 70–99)

## 2011-07-08 LAB — PRO B NATRIURETIC PEPTIDE: Pro B Natriuretic peptide (BNP): 854.4 pg/mL — ABNORMAL HIGH (ref 0–450)

## 2011-07-08 LAB — HEMOGLOBIN AND HEMATOCRIT, BLOOD: Hemoglobin: 8 g/dL — ABNORMAL LOW (ref 12.0–15.0)

## 2011-07-09 ENCOUNTER — Inpatient Hospital Stay (HOSPITAL_COMMUNITY): Payer: Medicare Other

## 2011-07-09 LAB — GLUCOSE, CAPILLARY
Glucose-Capillary: 177 mg/dL — ABNORMAL HIGH (ref 70–99)
Glucose-Capillary: 147 mg/dL — ABNORMAL HIGH (ref 70–99)
Glucose-Capillary: 173 mg/dL — ABNORMAL HIGH (ref 70–99)

## 2011-07-09 LAB — CARDIAC PANEL(CRET KIN+CKTOT+MB+TROPI)
CK, MB: 6.2 ng/mL (ref 0.3–4.0)
Relative Index: 1 (ref 0.0–2.5)
Troponin I: <0.3 ng/mL (ref <0.30)

## 2011-07-09 LAB — BASIC METABOLIC PANEL
Chloride: 90 mEq/L — ABNORMAL LOW (ref 96–112)
Creatinine, Ser: 1.09 mg/dL (ref 0.50–1.10)
GFR calc Af Amer: 59 mL/min — ABNORMAL LOW (ref >60)
GFR calc non Af Amer: 49 mL/min — ABNORMAL LOW (ref >60)
Potassium: 3.9 mEq/L (ref 3.5–5.1)

## 2011-07-09 LAB — HEMOGLOBIN AND HEMATOCRIT, BLOOD: Hemoglobin: 7.6 g/dL — ABNORMAL LOW (ref 12.0–15.0)

## 2011-07-09 LAB — PRO B NATRIURETIC PEPTIDE: Pro B Natriuretic peptide (BNP): 1040 pg/mL — ABNORMAL HIGH (ref 0–450)

## 2011-07-09 LAB — PROTIME-INR
INR: 1.69 — ABNORMAL HIGH (ref 0.00–1.49)
Prothrombin Time: 20.2 seconds — ABNORMAL HIGH (ref 11.6–15.2)

## 2011-07-10 LAB — PROTIME-INR
INR: 1.93 — ABNORMAL HIGH (ref 0.00–1.49)
Prothrombin Time: 22.4 seconds — ABNORMAL HIGH (ref 11.6–15.2)

## 2011-07-10 LAB — CROSSMATCH
ABO/RH(D): A POS
Unit division: 0

## 2011-07-10 LAB — GLUCOSE, CAPILLARY
Glucose-Capillary: 147 mg/dL — ABNORMAL HIGH (ref 70–99)
Glucose-Capillary: 156 mg/dL — ABNORMAL HIGH (ref 70–99)
Glucose-Capillary: 117 mg/dL — ABNORMAL HIGH (ref 70–99)

## 2011-07-10 LAB — HEMOGLOBIN AND HEMATOCRIT, BLOOD
HCT: 31 % — ABNORMAL LOW (ref 36.0–46.0)
Hemoglobin: 10.8 g/dL — ABNORMAL LOW (ref 12.0–15.0)

## 2011-07-10 LAB — BASIC METABOLIC PANEL
BUN: 18 mg/dL (ref 6–23)
CO2: 30 mEq/L (ref 19–32)
Calcium: 8.8 mg/dL (ref 8.4–10.5)
Chloride: 86 mEq/L — ABNORMAL LOW (ref 96–112)
Creatinine, Ser: 0.99 mg/dL (ref 0.50–1.10)
GFR calc Af Amer: >60 mL/min (ref >60)
GFR calc non Af Amer: 55 mL/min — ABNORMAL LOW (ref >60)
Glucose, Bld: 123 mg/dL — ABNORMAL HIGH (ref 70–99)
Potassium: 3.3 mEq/L — ABNORMAL LOW (ref 3.5–5.1)
Sodium: 124 mEq/L — ABNORMAL LOW (ref 135–145)

## 2011-07-11 LAB — CBC
HCT: 30.5 % — ABNORMAL LOW (ref 36.0–46.0)
Hemoglobin: 10.5 g/dL — ABNORMAL LOW (ref 12.0–15.0)
MCH: 29.5 pg (ref 26.0–34.0)
MCHC: 34.4 g/dL (ref 30.0–36.0)
RDW: 13 % (ref 11.5–15.5)

## 2011-07-11 LAB — PROTIME-INR
INR: 1.91 — ABNORMAL HIGH (ref 0.00–1.49)
Prothrombin Time: 22.2 seconds — ABNORMAL HIGH (ref 11.6–15.2)

## 2011-07-11 LAB — BASIC METABOLIC PANEL
BUN: 19 mg/dL (ref 6–23)
CO2: 31 mEq/L (ref 19–32)
Calcium: 8.9 mg/dL (ref 8.4–10.5)
Chloride: 90 mEq/L — ABNORMAL LOW (ref 96–112)
Creatinine, Ser: 0.81 mg/dL (ref 0.50–1.10)

## 2011-07-11 LAB — GLUCOSE, CAPILLARY: Glucose-Capillary: 139 mg/dL — ABNORMAL HIGH (ref 70–99)

## 2011-07-16 NOTE — Op Note (Signed)
NAMEMarland Kitchen  Veronica, Young NO.:  [id number]  MEDICAL RECORD NO.:  [id number]  LOCATION:  0004                         FACILITY:  [facility]  PHYSICIAN:  Kipp Brood. Luqman Perrelli, M.D.DATE OF BIRTH:  Oct 26, 1933  DATE OF PROCEDURE:  07/05/2011 DATE OF DISCHARGE:                              OPERATIVE REPORT   SURGEON:  Kipp Brood. Gladstone Lighter, MD  ASSISTANT:  Judith Part. Chabon, PA  PREOPERATIVE DIAGNOSIS:  Severe degenerative arthritis, right knee.  POSTOPERATIVE DIAGNOSIS:  Severe degenerative arthritis, right knee.  OPERATION:  Right total knee arthroplasty utilizing DePuy system.  The sizes used were as follows:  The patella was a size 38 with 3 pegs. Femoral component was a size 3 right posterior stabilized.  Tibial tray was a size 3 cemented.  The insert was a size 3, 10 mm thickness. Gentamicin was used in the cement.  PROCEDURE:  Under spinal anesthesia, routine orthopedic prepping and draping of the right lower extremity was carried out.  At this time, the appropriate time-out was carried out.  Prior to that in the holding area, I marked the appropriate right leg.  Once the time-out was done and the prep was completed, the leg was exsanguinated with Esmarch, tourniquet was elevated to 350 mmHg.  At that time, the leg was placed in the [facility] knee holder.  We then flexed the knee and the incision was made over the anterior aspect of left knee.  Bleeders identified and cauterized.  Following that, 2 flaps were created.  I then carried out a median parapatellar approach.  The knee was flexed and then carried out medial and lateral meniscectomies and excised the anterior and posterior cruciate ligaments.  The initial drill holes made in the intercondylar notch.  With intramedullary guide, I removed 30 mm of distal femur off. Following that, the #2 jig was inserted, measured the femur to be a size 3 right.  Following that, I then measured the femur for a size 3 as I mentioned  and then cut my anterior, posterior and chamfering cuts in the usual fashion.  At this time, the knee then was flexed as I mentioned and then I inserted my intramedullary rod in the tibia and removed 6 mm thickness off the affected lateral side.  We measured tibial plateau to be a size 3.  Following that, I cut my keel cut out of the tibial plateau.  I then utilized my spacer blocks for tension in flexion and extension.  The keel cut was made after I did my tension measurements, then cut out the notch, cut out the distal femur.  Following that, the trial inserts were inserted.  We had excellent fit with the above- mentioned inserts and then I did a resurfacing procedure on the patella. Three drill holes were made in the patella in the usual fashion. Following that, I removed all trial components, thoroughly water picked out the knee and cemented all 3 components in simultaneously.  I checked for loose pieces of cement.  They were all removed.  After water picking out the knee again, I injected 10 cc of FloSeal into the compartments and then inserted my permanent size 3, 10 mm thickness  insert.  I reduced the knee and inserted Hemovac drain.  We had good motion, good extension, good flexion.  I closed the wound in layers in usual fashion.  After the first layer was closed, the tourniquet was let down.  We had good control of the bleeding.  I then closed the subcu with 0 Vicryl and the skin with metal staples.  Sterile Neosporin dressing was applied.  The patient had 1 g of IV Ancef preop.          ______________________________ Kipp Brood. Gladstone Lighter, M.D.     RAG/MEDQ  D:  07/05/2011  T:  07/05/2011  Job:  [id number]  cc:   Peter M. [name], M.D. [phone]  Electronically Signed by Latanya Maudlin M.D. on 07/16/2011 07:58:29 AM

## 2011-07-16 NOTE — Discharge Summary (Signed)
NAMEMarland Kitchen  VONNETTA, SCHMIEG NO.:  [id number]  MEDICAL RECORD NO.:  [id number]  LOCATION:  67                         FACILITY:  [facility]  PHYSICIAN:  Kipp Brood. Ronan Dion, M.D.DATE OF BIRTH:  1933/05/31  DATE OF ADMISSION:  07/05/2011 DATE OF DISCHARGE:                        DISCHARGE SUMMARY - REFERRING   Ms. Mehra is deaf.  She was admitted to the hospital and taken to surgery by me on July 05, 2011 at which time I did a total knee arthroplasty on the right.  Preop, she had surgical clearance by Dr. [name].  We had her scheduled 1-2 weeks prior to this, but had to cancel that last minute because of an abnormal rhythm.  Following that, she saw Dr. Peter [name] and she was cleared.  Following her surgery, she was seen in consultation and admitted to the step-down unit because of a supraventricular tachycardia.  She was doing well and she was placed on a monitored bed.  The patient did admit to having these intermittent rapid heartbeats in the past.  She denied any specific gross abnormalities at that time.  While in the hospital, she was followed by the Cardiology service and she was never in any real acute distress. She had multiple studies done while in the hospital.  The one issue was he was hyponatremic that slowly cleared where her sodium level did come up to relatively to much better levels.  The patient as I said had multiple studies while in the hospital.  RADIOLOGICAL STUDIES:  She had a chest x-ray on July 09, 2011 that showed mild interstitial pulmonary edema and small left effusion.  She had a CT angiogram done to rule out pulmonary embolism.  She became short of breath here in the hospital and there was no evidence of any acute pulmonary embolus.  There was a question of mild congestive heart failure and she was started on Lasix.  She had a postop knee x-ray that showed anatomical alignment of her prosthesis.  She was maintained on Coumadin while in the  hospital and her last INR on 7/19 was 1.91.  Her vital signs obviously were taken daily.  On July 09, 2011, her blood pressure was 116/40, heart rate 60, respirations 21.  At one time, she did have a heart rate down to 42 and the cardiologists were consulted about that.  She had multiple chemistry studies.  Her lowest sodium was down to 120 and that was several days ago.  On 07/11/2011, her sodium was 128, potassium 3.4, chloride 90, BUN 19, creatinine 0.81, glucose 125.  Her last hemoglobin was 10.5 and that was after 2 units of packed cells.  Her hematocrit was 30.5, white count of 7700, platelets 168,000. So, she progressed quite nicely while in the hospital.  She was maintained on her appropriate diabetic medications.  She had CBGs done also while in the hospital and those were monitored.  Her last CBG on 7/18 was on 117.  While in the hospital, the activities, she was also up ambulating with a walker full weightbearing.  She was also on knee machine.  FINAL DISCHARGE DIAGNOSES: 1. Supraventricular tachycardia. 2. Postcardiac catheterization in 2005, which showed a nonobstructive  coronary artery disease. 3. She is diabetic. 4. Hypertension. 5. Hyperlipidemia. 6. Hyponatremia. 7. Peripheral vascular disease. 8. Osteoarthritis. 9. Hypothyroidism. 10.Deafness. 11.Glaucoma.  PREVIOUS SURGERIES: 1. Cholecystectomy. 2. Left wrist surgery. 3. Post-endometrial resection.  ALLERGIES:  No known allergies.  MEDICATIONS: 1. I am submitting her discharge manager medication list from [facility].  She will have an additional Coumadin 5 mg by mouth     a day for the next 3-4 weeks. 2. Should be on Percocet 10/50 one every 4 hours p.r.n. for pain.  DISCHARGE DIAGNOSES: 1. Deafness. 2. Glaucoma. 3. Postop total knee arthroplasty on July 05, 2011. 4. Hyponatremia. 5. Supraventricular tachycardia.  DISCHARGE/PLAN:  She will see Dr. Gladstone Lighter in the office on July  26th for suture removal.  She will be maintained on Coumadin 5 mg a day, but she will need to have INRs done at the nursing facility 2 times a week and her INR should be maintained between 2 and 3.  The discharge instructions further; she will up ambulating full weightbearing with a walker.  DISCHARGE INSTRUCTIONS: 1. She will have a dressing change daily. 2. She may shower, but not sit in a tub. 3. Also, she should at least on a weekly basis have her electrolytes     managed. 4. She should have diabetic CBGs done at least twice a day.  She will     be remaining on the medicine that she was discharged on.          ______________________________ Kipp Brood. Gladstone Lighter, M.D.     RAG/MEDQ  D:  07/11/2011  T:  07/11/2011  Job:  [id number]  cc:   Peter M. [name], M.D. [phone]  Electronically Signed by Latanya Maudlin M.D. on 07/16/2011 07:58:31 AM

## 2011-07-22 NOTE — Consult Note (Signed)
NAMEMarland Young  Veronica Young, MAUSS NO.:  [id number]  MEDICAL RECORD NO.:  [id number]  LOCATION:  0004                         FACILITY:  [facility]  PHYSICIAN:  Darlin Coco, M.D. DATE OF BIRTH:  Feb 19, 1933  DATE OF CONSULTATION: DATE OF DISCHARGE:                                CONSULTATION   PRIMARY CARE PHYSICIAN:  Dr. Ouida Sills.  PRIMARY CARDIOLOGIST:  Peter M. [name], M.D.  CHIEF COMPLAINT:  Palpitations.  HISTORY OF PRESENT ILLNESS:  Veronica Young is a 77 year old female with no history of coronary artery disease.  Dr. [name] follows her for hypertension and hyperlipidemia as well as nonobstructive carotid disease.  She was at [facility] for a right total knee replacement. When she was put on the operating room table for prep, she had palpitations and had multiple episodes of SVT on telemetry.  She felt them but had no other symptoms, except for headache.  Specifically, she denies chest pain, shortness of breath, presyncope or syncope.  She was started on IV Cardizem and is maintaining sinus rhythm/sinus bradycardia in the 50s.  Veronica Young has a history of palpitations greater than 20 years ago but has had none since.  She was not diagnosed at that time.  She has otherwise been doing well, except for the arthritis symptoms which have caused her to need a knee replacement.  Currently, she feels well.  PAST MEDICAL HISTORY: 1. Palpitations approximately 23 years ago with no further details     available. 2. Status post cardiac catheterization in 2005 with 20% and 40%     lesions in the LAD, circumflex 30% and 50%, luminal irregularities     in the RCA, renal arteries patent bilaterally. 3. Status post Myoview in April 2010 showing no scar or ischemia and     an EF of 70%. 4. Diabetes. 5. Hypertension. 6. Hyperlipidemia. 7. Family history of coronary artery disease. 8. Gastroesophageal reflux disease. 9. Osteoarthritis. 10.Peripheral vascular disease with  bilateral SFA disease, diagnosed     in 2007 as well as less than 40% bilateral carotid disease by     Doppler of 2010. 11.Glaucoma. 12.Deafness. 13.Depression after husband's death. 14.Hypothyroidism.  PAST SURGICAL HISTORY:  She is status post endometrial resection as well as cholecystectomy and left wrist surgery.  ALLERGIES:  No known drug allergies.  CURRENT MEDICATIONS: 1. Clonidine 0.1 mg b.i.d. 2. Diovan HCT 320/25 mg daily. 3. Levothyroxine 50 mcg daily. 4. Metformin 500 mg b.i.d. 5. Lipitor 40 mg a day. 6. Celexa 20 mg a day. 7. Aspirin 81 mg a day. 8. Eyedrops.  SOCIAL HISTORY:  She lives in [place] alone since her husband died last Sep 30, 2023.  She is a housewife.  She has no history of alcohol, tobacco or drug abuse.  FAMILY HISTORY:  Her mother died at 58 with possible history of coronary artery disease and at least one sibling has coronary artery disease but her father died at 47 with cancer.  REVIEW OF SYSTEMS:  She wears glasses and also has significant hearing loss.  The palpitations were felt today but otherwise none recently. For depression as well, treated by the Celexa.  She has arthralgias and joint pain.  She  denies reflux symptoms or melena.  Full 14-point review of systems is otherwise, negative except as stated in the HPI.  PHYSICAL EXAMINATION:  VITAL SIGNS:  She is afebrile.  Blood pressure 180/73, heart rate 67, respiratory rate 23, O2 saturation 100% on 2 liters. GENERAL:  She is a well-developed elderly white female, in no acute distress. HEENT:  Normal. NECK:  There is no lymphadenopathy, thyromegaly or JVD noted and no bruits were appreciated. CVA:  Heart is regular rate and rhythm with an S1 and S2.  No significant murmur, rub or gallop is noted.  She has decreased lower extremity pulses bilaterally, both DP and PT pulses but capillary refill is within normal limits.  Radial pulses are 2+ bilaterally. LUNGS:  She has a few rales  that clear with cough. SKIN:  No rashes or lesions are noted. ABDOMEN:  Soft and nontender with active bowel sounds. EXTREMITIES:  There is no cyanosis or clubbing, only trace edema. MUSCULOSKELETAL:  There is no joint deformity and no spine or CVA tenderness. NEUROLOGIC:  She is alert and oriented.  Cranial nerves II through XII grossly intact.  RADIOLOGICAL STUDIES:  Chest x-ray done on June 19, 2011 showed no acute disease.  EKG shows sinus rhythm, PACs, rate 75 that is minimally changed from an EKG dated June 19, 2011.  Upon strip review, she has borderline wide complex tachycardia that is regular and rate slightly greater than 150. The runs are not prolonged.  It appears to be atrial tachycardia or SVT.  LABORATORY VALUES:  Hemoglobin 13.8, hematocrit 40.3, WBC 7.7, platelets 183,000.  Sodium 133, potassium 3.5, chloride 97, CO2 29, BUN 15, creatinine 0.81, glucose 116, albumin 3.4, CK-MB 99/2.7 with a troponin of less than 0.3.  IMPRESSION:  Veronica Young was seen today by Dr. Mare Ferrari, the patient evaluated and the data reviewed.  Veronica Young is in good health except for her history of systolic hypertension.  She had been on previous Toprol but not recently.  She had runs of SVT, as she was being prepared for surgery.  PLAN: 1. Admit for overnight observation on telemetry which Dr. Gladstone Lighter has     done. 2. Replete her potassium which is borderline low and start her on a     daily dose as well since she is on a diuretic. 3. Restart Toprol XL 50 mg now and daily.  The Cardizem drip can be     weaned and discontinued once the beta blocker is given. 4. Home in a.m., on current meds Toprol and K-Dur. 5. Follow up with Dr. [name] then reschedule for knee surgery. 6. Check 2-D echocardiogram.     Rosaria Ferries, PA-C   ______________________________ Darlin Coco, M.D.    RB/MEDQ  D:  06/21/2011  T:  06/21/2011  Job:  [id number]  Electronically Signed by Rosaria Ferries  PA-C on 07/09/2011 04:56:39 PM Electronically Signed by Darlin Coco M.D. on 07/22/2011 12:44:19 PM

## 2011-07-24 ENCOUNTER — Ambulatory Visit: Payer: Medicare Other | Admitting: Cardiology

## 2011-08-23 ENCOUNTER — Encounter (HOSPITAL_BASED_OUTPATIENT_CLINIC_OR_DEPARTMENT_OTHER): Payer: Medicare Other | Attending: General Surgery

## 2011-08-23 DIAGNOSIS — L8990 Pressure ulcer of unspecified site, unspecified stage: Secondary | ICD-10-CM | POA: Insufficient documentation

## 2011-08-23 DIAGNOSIS — L89609 Pressure ulcer of unspecified heel, unspecified stage: Secondary | ICD-10-CM | POA: Insufficient documentation

## 2011-08-23 DIAGNOSIS — E119 Type 2 diabetes mellitus without complications: Secondary | ICD-10-CM | POA: Insufficient documentation

## 2011-08-23 DIAGNOSIS — Z79899 Other long term (current) drug therapy: Secondary | ICD-10-CM | POA: Insufficient documentation

## 2011-08-23 DIAGNOSIS — I10 Essential (primary) hypertension: Secondary | ICD-10-CM | POA: Insufficient documentation

## 2011-08-23 DIAGNOSIS — Z96659 Presence of unspecified artificial knee joint: Secondary | ICD-10-CM | POA: Insufficient documentation

## 2011-08-23 DIAGNOSIS — E039 Hypothyroidism, unspecified: Secondary | ICD-10-CM | POA: Insufficient documentation

## 2011-08-30 ENCOUNTER — Encounter (HOSPITAL_BASED_OUTPATIENT_CLINIC_OR_DEPARTMENT_OTHER): Payer: Medicare Other | Attending: General Surgery

## 2011-08-30 DIAGNOSIS — L8990 Pressure ulcer of unspecified site, unspecified stage: Secondary | ICD-10-CM | POA: Insufficient documentation

## 2011-08-30 DIAGNOSIS — L89609 Pressure ulcer of unspecified heel, unspecified stage: Secondary | ICD-10-CM | POA: Insufficient documentation

## 2011-08-30 DIAGNOSIS — Z96659 Presence of unspecified artificial knee joint: Secondary | ICD-10-CM | POA: Insufficient documentation

## 2011-08-30 DIAGNOSIS — Z79899 Other long term (current) drug therapy: Secondary | ICD-10-CM | POA: Insufficient documentation

## 2011-08-30 DIAGNOSIS — E119 Type 2 diabetes mellitus without complications: Secondary | ICD-10-CM | POA: Insufficient documentation

## 2011-08-30 DIAGNOSIS — E039 Hypothyroidism, unspecified: Secondary | ICD-10-CM | POA: Insufficient documentation

## 2011-08-30 DIAGNOSIS — I10 Essential (primary) hypertension: Secondary | ICD-10-CM | POA: Insufficient documentation

## 2011-09-13 ENCOUNTER — Ambulatory Visit (INDEPENDENT_AMBULATORY_CARE_PROVIDER_SITE_OTHER): Payer: Medicare Other | Admitting: Cardiology

## 2011-09-13 ENCOUNTER — Encounter: Payer: Self-pay | Admitting: Cardiology

## 2011-09-13 DIAGNOSIS — I10 Essential (primary) hypertension: Secondary | ICD-10-CM

## 2011-09-13 DIAGNOSIS — I471 Supraventricular tachycardia: Secondary | ICD-10-CM

## 2011-09-13 NOTE — Patient Instructions (Signed)
Continue with your current medications.  Avoid caffeine and decongestants.  I will see you again in 4 months.

## 2011-09-13 NOTE — Assessment & Plan Note (Signed)
Blood pressure is elevated today but has been fluctuating a fair amount. We will continue to monitor her on her current therapy. She is on multiple medications. She needs to avoid salt intake.

## 2011-09-13 NOTE — Progress Notes (Signed)
HPI [name] is seen for followup of  his versus mitral tachycardia. She has reported several episodes at night where her heart races very briefly. This usually occurs when she is lying down. She did have an episode yesterday afternoon that lasted 3-4 minutes. Her blood pressure has been fluctuating a fair amount. She denies any dizziness or syncope. She has had no shortness of breath or chest pain.  Allergies  Allergen Reactions  . Amlodipine   . Benicar (Olmesartan Medoxomil)     Current Outpatient Prescriptions on File Prior to Visit  Medication Sig Dispense Refill  . aspirin 81 MG tablet Take 81 mg by mouth daily.        [name] atorvastatin (LIPITOR) 40 MG tablet Take 40 mg by mouth daily.        . Calcium Carbonate-Vitamin D (CALCIUM 600 + D PO) Take 600 mg by mouth daily.        . citalopram (CELEXA) 20 MG tablet       . cloNIDine (CATAPRES) 0.1 MG tablet Take 1 tablet (0.1 mg total) by mouth 2 (two) times daily.  60 tablet  0  . dorzolamide-timolol (COSOPT) 22.3-6.8 MG/ML ophthalmic solution       . KLOR-CON 10 10 MEQ CR tablet Take 1 tablet by mouth Daily.      [name] latanoprost (XALATAN) 0.005 % ophthalmic solution 1 drop at bedtime.        [name] levothyroxine (SYNTHROID, LEVOTHROID) 50 MCG tablet Take 50 mcg by mouth daily.        . metFORMIN (GLUCOPHAGE) 500 MG tablet Take 500 mg by mouth 2 (two) times daily with a meal.       . metoprolol (TOPROL-XL) 50 MG 24 hr tablet Take 50 mg by mouth daily.        [name] VITAMIN D, CHOLECALCIFEROL, PO Take 600 mg by mouth daily.        . valsartan-hydrochlorothiazide (DIOVAN-HCT) 320-25 MG per tablet Take 1 tablet by mouth daily.          Past Medical History  Diagnosis Date  . Hypertension   . Diabetes mellitus     type 2  . Hypercholesterolemia   . Deafness   . Carotid bruit   . GERD (gastroesophageal reflux disease)   . Glaucoma   . Hypothyroidism   . PAT (paroxysmal atrial tachycardia)     Past Surgical History  Procedure Date  .  Cholycystectomy     Family History  Problem Relation Age of Onset  . Heart disease Mother   . Liver cancer Father   . Heart disease Sister   . Heart disease Brother   . Stroke Sister     History   Social History  . Marital Status: Married    Spouse Name: N/A    Number of Children: 4  . Years of Education: N/A   Occupational History  . Not on file.   Social History Main Topics  . Smoking status: Never Smoker   . Smokeless tobacco: Not on file  . Alcohol Use: No  . Drug Use: No  . Sexually Active: Not on file   Other Topics Concern  . Not on file   Social History Narrative  . No narrative on file    ROS  as per history of present illness. All other systems were reviewed and are negative.  PHYSICAL EXAM BP 160/88  Pulse 80  Ht 5\' 6"  (1.676 m)  Wt 146 lb 6.4 oz (66.407 kg)  BMI 23.63 kg/m2 The patient is alert and oriented x 3.  The mood and affect are normal.  The skin is warm and dry.  Color is normal.  The HEENT exam reveals that the sclera are nonicteric.  The mucous membranes are moist. She is deaf. The carotids are 2+ without bruits.  There is no thyromegaly.  There is no JVD.  The lungs are clear.  The chest wall is non tender.  The heart exam reveals a regular rate with a normal S1 and S2.  There are no murmurs, gallops, or rubs.  The PMI is not displaced.   Abdominal exam reveals good bowel sounds.  There is no guarding or rebound.  There is no hepatosplenomegaly or tenderness.  There are no masses.  Exam of the legs reveal no clubbing, cyanosis, or edema.  The legs are without rashes.  The distal pulses are intact.  Cranial nerves II - XII are intact.  Motor and sensory functions are intact.  The gait is normal. ASSESSMENT AND PLAN

## 2011-09-13 NOTE — Assessment & Plan Note (Signed)
She is still having brief episodes of PAT by history. I've recommended she continue with metoprolol for long-term maintenance. This will also help with her blood pressure control. She does note some mild fatigue with this but I think it is worthwhile. I recommended that she avoid caffeine or decongestants.

## 2011-10-03 ENCOUNTER — Emergency Department (HOSPITAL_COMMUNITY)
Admission: EM | Admit: 2011-10-03 | Discharge: 2011-10-04 | Disposition: A | Discharge status: Home / Self Care | Payer: Medicare Other | Attending: Emergency Medicine | Admitting: Emergency Medicine

## 2011-10-03 ENCOUNTER — Emergency Department (HOSPITAL_COMMUNITY): Payer: Medicare Other

## 2011-10-03 DIAGNOSIS — I10 Essential (primary) hypertension: Secondary | ICD-10-CM | POA: Insufficient documentation

## 2011-10-03 DIAGNOSIS — R51 Headache: Secondary | ICD-10-CM | POA: Insufficient documentation

## 2011-10-03 DIAGNOSIS — R609 Edema, unspecified: Secondary | ICD-10-CM | POA: Insufficient documentation

## 2011-10-03 DIAGNOSIS — E039 Hypothyroidism, unspecified: Secondary | ICD-10-CM | POA: Insufficient documentation

## 2011-10-03 DIAGNOSIS — I739 Peripheral vascular disease, unspecified: Secondary | ICD-10-CM | POA: Insufficient documentation

## 2011-10-03 DIAGNOSIS — E119 Type 2 diabetes mellitus without complications: Secondary | ICD-10-CM | POA: Insufficient documentation

## 2011-10-03 DIAGNOSIS — I2510 Atherosclerotic heart disease of native coronary artery without angina pectoris: Secondary | ICD-10-CM | POA: Insufficient documentation

## 2011-11-11 NOTE — H&P (Signed)
  NAMEMarland Kitchen  SOLAI, Veronica NO.:  [id number]  MEDICAL RECORD NO.:  [id number]  LOCATION:  FOOT                         FACILITY:  [facility]  PHYSICIAN:  Judene Companion, M.D.     DATE OF BIRTH:  September 24, 1933  DATE OF ADMISSION:  08/23/2011 DATE OF DISCHARGE:                             HISTORY & PHYSICAL   This is a very pleasant 77 year old lady who is deaf but understands sign language very well.  She is a type 2 diabetic, has hypertension. She also is hypothyroid.  She takes many medicines including Diovan, metoprolol, tramadol for pain.  She takes Synthroid for hypothyroidism and she takes clonidine and she takes Glucophage for diabetes.  She enters at this time afebrile with blood pressure 120/80 and through the interpreter I got that she several months ago had a total knee replaced in the right leg and then developed a pressure sore on her right heel and today I debrided it.  It seemed to be fairly superficial a Wagner 2. I think it was mostly epidermis and I did not really get through the dermis after I got all the necrotic material off and then we put a collagen dressing and heel protector and she will come back in a week. This lady had an excellent pulses.  I do not think there is going to be any problem healing it.  It is afebrile.  There is no evidence of any infection.     Judene Companion, M.D.     PP/MEDQ  D:  08/23/2011  T:  08/24/2011  Job:  [id number]  Electronically Signed by Judene Companion  on 11/11/2011 02:46:22 PM

## 2011-12-13 ENCOUNTER — Emergency Department (HOSPITAL_COMMUNITY): Payer: Medicare Other

## 2011-12-13 ENCOUNTER — Emergency Department (HOSPITAL_COMMUNITY)
Admission: EM | Admit: 2011-12-13 | Discharge: 2011-12-14 | Disposition: A | Discharge status: Home / Self Care | Payer: Medicare Other | Attending: Emergency Medicine | Admitting: Emergency Medicine

## 2011-12-13 ENCOUNTER — Encounter (HOSPITAL_COMMUNITY): Payer: Self-pay | Admitting: Emergency Medicine

## 2011-12-13 DIAGNOSIS — W1809XA Striking against other object with subsequent fall, initial encounter: Secondary | ICD-10-CM | POA: Insufficient documentation

## 2011-12-13 DIAGNOSIS — S0003XA Contusion of scalp, initial encounter: Secondary | ICD-10-CM | POA: Insufficient documentation

## 2011-12-13 DIAGNOSIS — S0180XA Unspecified open wound of other part of head, initial encounter: Secondary | ICD-10-CM | POA: Insufficient documentation

## 2011-12-13 DIAGNOSIS — S1093XA Contusion of unspecified part of neck, initial encounter: Secondary | ICD-10-CM | POA: Insufficient documentation

## 2011-12-13 DIAGNOSIS — S0083XA Contusion of other part of head, initial encounter: Secondary | ICD-10-CM

## 2011-12-13 DIAGNOSIS — S0181XA Laceration without foreign body of other part of head, initial encounter: Secondary | ICD-10-CM

## 2011-12-13 MED ORDER — LIDOCAINE-EPINEPHRINE 2 %-1:100000 IJ SOLN
20.0000 mL | Freq: Once | INTRAMUSCULAR | Status: AC
  Administered 2011-12-14: 20 mL via INTRADERMAL
  Filled 2011-12-13: qty 1
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
## 2011-12-13 NOTE — ED Notes (Signed)
Pt reports: pt had several things in hand and fell of of chair that slipped and struck forehead on TV cabinet. Denies LOC, nausea. Reports only minor pain at site of hematoma to right upper forhead. Pt is deaf/mute, A&O. No neuro symptoms at this time.

## 2011-12-13 NOTE — ED Notes (Signed)
Piv attemps x2, painful for pt. No line at this time. Pt did not take bp meds yet today.

## 2011-12-14 NOTE — ED Notes (Signed)
MD at bedside. 

## 2012-01-07 ENCOUNTER — Ambulatory Visit (INDEPENDENT_AMBULATORY_CARE_PROVIDER_SITE_OTHER): Payer: Medicare Other | Admitting: Cardiology

## 2012-01-07 ENCOUNTER — Encounter: Payer: Self-pay | Admitting: Cardiology

## 2012-01-07 VITALS — BP 200/90 | HR 58 | Ht 66.0 in | Wt 149.9 lb

## 2012-01-07 DIAGNOSIS — I10 Essential (primary) hypertension: Secondary | ICD-10-CM

## 2012-01-07 DIAGNOSIS — I471 Supraventricular tachycardia: Secondary | ICD-10-CM

## 2012-01-07 MED ORDER — CLONIDINE HCL 0.1 MG PO TABS: 0.1000 mg | ORAL_TABLET | Freq: Every day | ORAL | Status: DC

## 2012-01-07 MED ORDER — VALSARTAN-HYDROCHLOROTHIAZIDE 320-25 MG PO TABS: 1.0000 | ORAL_TABLET | Freq: Every day | ORAL | Status: DC
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
## 2012-01-07 NOTE — Assessment & Plan Note (Signed)
No significant symptoms of palpitations.

## 2012-01-07 NOTE — Patient Instructions (Signed)
Resume valsartan HCT (diovan HCT) once a day. Prescription was sent in.  Continue your other medications.  I will have you see Cecille Rubin in 2 weeks ( my nurse practitioner)  Keep a diary of your blood pressure at home and bring with you next visit.

## 2012-01-07 NOTE — Assessment & Plan Note (Signed)
Her blood pressure is significantly elevated. I have resumed her Diovan HCT and have sent in a prescription. I have asked that she keep a daily diary of her blood pressure. We'll continue metoprolol and clonidine. I'll have her followup in 2 weeks to reassess her blood pressure control.

## 2012-01-24 ENCOUNTER — Ambulatory Visit (INDEPENDENT_AMBULATORY_CARE_PROVIDER_SITE_OTHER): Payer: Medicare Other | Admitting: Nurse Practitioner

## 2012-01-24 ENCOUNTER — Encounter: Payer: Self-pay | Admitting: Nurse Practitioner

## 2012-01-24 VITALS — BP 188/98 | HR 58 | Ht 66.0 in | Wt 149.0 lb

## 2012-01-24 DIAGNOSIS — I10 Essential (primary) hypertension: Secondary | ICD-10-CM

## 2012-01-24 LAB — BASIC METABOLIC PANEL
BUN: 23 mg/dL (ref 6–23)
CO2: 30 mEq/L (ref 19–32)
Calcium: 9.1 mg/dL (ref 8.4–10.5)
Chloride: 98 mEq/L (ref 96–112)
Creatinine, Ser: 0.9 mg/dL (ref 0.4–1.2)
GFR: 61.5 mL/min (ref >60.00)
Glucose, Bld: 86 mg/dL (ref 70–99)
Potassium: 4.2 mEq/L (ref 3.5–5.1)
Sodium: 135 mEq/L (ref 135–145)

## 2012-01-24 MED ORDER — CLONIDINE HCL 0.1 MG PO TABS: 0.1000 mg | ORAL_TABLET | Freq: Two times a day (BID) | ORAL | Status: DC

## 2012-01-24 NOTE — Patient Instructions (Addendum)
Take your Metoprolol at night.  Take the Clonidine 0.1mg  two times a day  Take your Diovan Hct in the morning  I will see you in 2 weeks with Dr. [name]. Continue to check your blood pressure at home and bring your cuff in for [place] to check.  Call the [facility] office at [phone] if you have any questions, problems or concerns.

## 2012-01-24 NOTE — Progress Notes (Signed)
Veronica Young Date of Birth: 01-21-1933 Medical Record [id number]  History of Present Illness: Veronica Young is seen back today for a 2 week check. She is seen for Dr. [name]. She has been put back on Diovan Hct for her blood pressure. She has been taking it at night along with her Clonidine. She does note more nocturia with this schedule. She takes her metoprolol in the mornings. She brings in her list of blood pressures which show her blood pressure in the [id number] systolic range.   She is here with her sign language interpreter. She feels good. Has no complaint. Some dry mouth due to the clonidine. No chest pain or shortness of breath. No headache. No more falls.   Current Outpatient Prescriptions on File Prior to Visit  Medication Sig Dispense Refill  . aspirin 81 MG tablet Take 81 mg by mouth daily.        [name] atorvastatin (LIPITOR) 40 MG tablet Take 40 mg by mouth daily.        . Calcium Carbonate-Vitamin D (CALCIUM 600 + D PO) Take 600 mg by mouth daily.        . citalopram (CELEXA) 20 MG tablet 20 mg daily.       . dorzolamide-timolol (COSOPT) 22.3-6.8 MG/ML ophthalmic solution       . KLOR-CON 10 10 MEQ CR tablet Take 1 tablet by mouth Daily.      [name] latanoprost (XALATAN) 0.005 % ophthalmic solution 1 drop at bedtime.        [name] levothyroxine (SYNTHROID, LEVOTHROID) 50 MCG tablet Take 50 mcg by mouth daily.        . metoprolol (TOPROL-XL) 50 MG 24 hr tablet Take 50 mg by mouth daily.        . valsartan-hydrochlorothiazide (DIOVAN-HCT) 320-25 MG per tablet Take 1 tablet by mouth daily.  30 tablet  3  . VITAMIN D, CHOLECALCIFEROL, PO Take 600 mg by mouth daily.          Allergies  Allergen Reactions  . Amlodipine   . Benicar (Olmesartan Medoxomil)     Past Medical History  Diagnosis Date  . Hypertension   . Diabetes mellitus     type 2  . Hypercholesterolemia   . Deafness   . Carotid bruit   . GERD (gastroesophageal reflux disease)   . Glaucoma   . Hypothyroidism   . PAT  (paroxysmal atrial tachycardia)     Past Surgical History  Procedure Date  . Cholycystectomy     History  Smoking status  . Never Smoker   Smokeless tobacco  . Not on file    History  Alcohol Use No    Family History  Problem Relation Age of Onset  . Heart disease Mother   . Liver cancer Father   . Heart disease Sister   . Heart disease Brother   . Stroke Sister     Review of Systems: The review of systems is per the HPI. She had had a fall prior to her last visit from tripping over a chair and landing on her face. No cardiac complaints.  All other systems were reviewed and are negative.  Physical Exam: BP 188/98  Pulse 58  Ht 5\' 6"  (1.676 m)  Wt 149 lb (67.586 kg)  BMI 24.05 kg/m2 Patient is very pleasant and in no acute distress. She is alert and appropriate. She speaks thru sign language. Skin is warm and dry. Color is normal.  HEENT is unremarkable.  Normocephalic/atraumatic. PERRL. Sclera are nonicteric. Neck is supple. No masses. No JVD. Lungs are clear. Cardiac exam shows a regular rate and rhythm. Abdomen is soft. Extremities are without edema. Gait and ROM are intact. No gross neurologic deficits noted.  LABORATORY DATA: BMET is pending   Assessment / Plan:

## 2012-01-24 NOTE — Assessment & Plan Note (Signed)
Blood pressure remains up significantly here. She has better readings from home. I have asked her to bring her cuff in to check for correlation. We will increase the Clonidine to BID. Change the Diovan Hct to the mornings and the metoprolol at night. I will see her back with Dr. [name]. BMET will be checked today. Patient is agreeable to this plan and will call if any problems develop in the interim.

## 2012-01-28 NOTE — Progress Notes (Signed)
Mailed results  

## 2012-02-04 ENCOUNTER — Ambulatory Visit (INDEPENDENT_AMBULATORY_CARE_PROVIDER_SITE_OTHER): Payer: Medicare Other | Admitting: Nurse Practitioner

## 2012-02-04 ENCOUNTER — Encounter: Payer: Self-pay | Admitting: Nurse Practitioner

## 2012-02-04 VITALS — BP 158/82 | HR 64 | Ht 66.0 in | Wt 151.0 lb

## 2012-02-04 DIAGNOSIS — I10 Essential (primary) hypertension: Secondary | ICD-10-CM

## 2012-02-04 MED ORDER — CLONIDINE HCL 0.1 MG PO TABS: 0.2000 mg | ORAL_TABLET | Freq: Every day | ORAL | Status: DC

## 2012-02-04 NOTE — Patient Instructions (Signed)
Let's try the Clonidine 2 tablets at night.  Continue to monitor your blood pressure at home.  I will see you in 10 days.   Call the [facility] office at [phone] if you have any questions, problems or concerns.

## 2012-02-04 NOTE — Progress Notes (Signed)
Veronica Young Date of Birth: 03/27/1933 Medical Record [id number]  History of Present Illness: Veronica Young is seen back today for a one week check. She is seen for Dr. [name]. We increased her Clonidine to BID last week for better BP control. We also changed the timing of her other medicines. She comes back today for follow up. She has 2 sign language interpreters present.   She is feeling well. No chest pain. No shortness of breath. She notes that she only took the morning dose of her Clonidine for just a day or so. She got so dizzy and could not drive. She remains on the 0.1mg  at bedtime. Readings at home remain variable. 128/77 to 179/62 reported. She now feels better.   Current Outpatient Prescriptions on File Prior to Visit  Medication Sig Dispense Refill  . aspirin 81 MG tablet Take 81 mg by mouth daily.        [name] atorvastatin (LIPITOR) 40 MG tablet Take 40 mg by mouth daily.        . Calcium Carbonate-Vitamin D (CALCIUM 600 + D PO) Take 600 mg by mouth daily.        . citalopram (CELEXA) 20 MG tablet 20 mg daily.       . dorzolamide-timolol (COSOPT) 22.3-6.8 MG/ML ophthalmic solution       . KLOR-CON 10 10 MEQ CR tablet Take 1 tablet by mouth Daily.      [name] latanoprost (XALATAN) 0.005 % ophthalmic solution 1 drop at bedtime.        [name] levothyroxine (SYNTHROID, LEVOTHROID) 50 MCG tablet Take 50 mcg by mouth daily.        . metoprolol (TOPROL-XL) 50 MG 24 hr tablet Take 50 mg by mouth daily.        . valsartan-hydrochlorothiazide (DIOVAN-HCT) 320-25 MG per tablet Take 1 tablet by mouth daily.  30 tablet  3  . VITAMIN D, CHOLECALCIFEROL, PO Take 600 mg by mouth daily.          Allergies  Allergen Reactions  . Amlodipine   . Benicar (Olmesartan Medoxomil)     Past Medical History  Diagnosis Date  . Hypertension   . Diabetes mellitus     type 2  . Hypercholesterolemia   . Deafness   . Carotid bruit   . GERD (gastroesophageal reflux disease)   . Glaucoma   . Hypothyroidism   .  PAT (paroxysmal atrial tachycardia)     Past Surgical History  Procedure Date  . Cholycystectomy     History  Smoking status  . Never Smoker   Smokeless tobacco  . Not on file    History  Alcohol Use No    Family History  Problem Relation Age of Onset  . Heart disease Mother   . Liver cancer Father   . Heart disease Sister   . Heart disease Brother   . Stroke Sister     Review of Systems: The review of systems is per the HPI.  All other systems were reviewed and are negative.  Physical Exam: BP 158/82  Pulse 64  Ht 5\' 6"  (1.676 m)  Wt 151 lb (68.493 kg)  BMI 24.37 kg/m2 Patient is very pleasant and in no acute distress. We have communicated via the sign language interpreter. Skin is warm and dry. Color is normal.  HEENT is unremarkable. Normocephalic/atraumatic. PERRL. Sclera are nonicteric. Neck is supple. No masses. No JVD. Lungs are clear. Cardiac exam shows a regular rate and rhythm. Abdomen is  soft. Extremities are without edema. Gait and ROM are intact. No gross neurologic deficits noted.   LABORATORY DATA:   Assessment / Plan:

## 2012-02-04 NOTE — Assessment & Plan Note (Signed)
She would like to try 0.2 mg of Clonidine at night. She does have phone capability to call us if she has any adverse reaction. Could consider hydralazine on return if BP not controlled. Will see her back in about 2 weeks. Patient is agreeable to this plan and will call if any problems develop in the interim.

## 2012-02-14 ENCOUNTER — Ambulatory Visit: Payer: Medicare Other | Admitting: Nurse Practitioner

## 2012-02-19 ENCOUNTER — Ambulatory Visit (INDEPENDENT_AMBULATORY_CARE_PROVIDER_SITE_OTHER): Payer: Medicare Other | Admitting: Nurse Practitioner

## 2012-02-19 ENCOUNTER — Encounter: Payer: Self-pay | Admitting: Nurse Practitioner

## 2012-02-19 VITALS — BP 188/90 | HR 66 | Ht 66.0 in | Wt 151.0 lb

## 2012-02-19 DIAGNOSIS — I10 Essential (primary) hypertension: Secondary | ICD-10-CM

## 2012-02-19 DIAGNOSIS — I739 Peripheral vascular disease, unspecified: Secondary | ICD-10-CM

## 2012-02-19 MED ORDER — CLONIDINE HCL 0.2 MG PO TABS: 0.2000 mg | ORAL_TABLET | Freq: Every day | ORAL | Status: DC

## 2012-02-19 NOTE — Assessment & Plan Note (Signed)
She is complaining of exertional burning in both legs with walking. Improves with rest. Will check lower extremity arterial dopplers.

## 2012-02-19 NOTE — Patient Instructions (Signed)
I have sent a prescription for the 0.2 mg tablets of Clonidine to the drug store. Continue this at night.  Stay on all your medicines.  We will check a doppler of your legs to look at the blood flow to your legs.   We will see you back in about 3 months.  Keep a check on your blood pressure at home.  Call the [facility] office at [phone] if you have any questions, problems or concerns.

## 2012-02-19 NOTE — Assessment & Plan Note (Signed)
Blood pressure is better at home. We have already checked her cuff. I am going to leave her on her current regimen. She will continue to monitor at home. We will see her back in about 3 months. Patient is agreeable to this plan and will call if any problems develop in the interim.

## 2012-02-19 NOTE — Progress Notes (Signed)
Stanton Kidney Date of Birth: 1933/01/17 Medical Record [id number]  History of Present Illness: Veronica Young is seen back today for a 2 week check. She is seen for Dr. [name]. She has had worsening HTN. She does not tolerate taking Clonidine in the morning due to dizziness. She is intolerant to both Norvasc and Benicar but is able to take Diovan with her HCT. She is here with a sign language interpreter.  She comes in today. She feels pretty good. She denies chest pain or shortness of breath. She is not lightheaded or dizzy. She is tolerating her medicines. Her blood pressure readings from home are much better than here. We did check her cuff at her last visit and it did match up. Her only complaint that she wanted to discuss today is a burning type pain that goes down her legs when she tries to walk. She has to stop and rest. The discomfort gets better with rest but returns when she tries to walk again. She says she has had remote doppler study on her legs. I do not see this in either her electronic or paper chart. She did have carotid dopplers a couple of years ago which showed minor disease.   Current Outpatient Prescriptions on File Prior to Visit  Medication Sig Dispense Refill  . aspirin 81 MG tablet Take 81 mg by mouth daily.        [name] atorvastatin (LIPITOR) 40 MG tablet Take 40 mg by mouth daily.        . Calcium Carbonate-Vitamin D (CALCIUM 600 + D PO) Take 600 mg by mouth daily.        . citalopram (CELEXA) 20 MG tablet 20 mg daily.       . cloNIDine (CATAPRES) 0.1 MG tablet Take 2 tablets (0.2 mg total) by mouth at bedtime.  60 tablet  0  . dorzolamide-timolol (COSOPT) 22.3-6.8 MG/ML ophthalmic solution       . KLOR-CON 10 10 MEQ CR tablet Take 1 tablet by mouth Daily.      [name] latanoprost (XALATAN) 0.005 % ophthalmic solution 1 drop at bedtime.        [name] levothyroxine (SYNTHROID, LEVOTHROID) 50 MCG tablet Take 50 mcg by mouth daily.        . metoprolol (TOPROL-XL) 50 MG 24 hr tablet Take 50  mg by mouth daily.        . valsartan-hydrochlorothiazide (DIOVAN-HCT) 320-25 MG per tablet Take 1 tablet by mouth daily.  30 tablet  3  . VITAMIN D, CHOLECALCIFEROL, PO Take 600 mg by mouth daily.          Allergies  Allergen Reactions  . Amlodipine   . Benicar (Olmesartan Medoxomil)     Past Medical History  Diagnosis Date  . Hypertension   . Diabetes mellitus     type 2  . Hypercholesterolemia   . Deafness   . Carotid bruit   . GERD (gastroesophageal reflux disease)   . Glaucoma   . Hypothyroidism   . PAT (paroxysmal atrial tachycardia)     Past Surgical History  Procedure Date  . Cholycystectomy     History  Smoking status  . Never Smoker   Smokeless tobacco  . Not on file    History  Alcohol Use No    Family History  Problem Relation Age of Onset  . Heart disease Mother   . Liver cancer Father   . Heart disease Sister   . Heart disease Brother   .  Stroke Sister     Review of Systems: The review of systems is per the HPI.  All other systems were reviewed and are negative.  Physical Exam: BP 188/90  Pulse 66  Ht 5\' 6"  (1.676 m)  Wt 151 lb (68.493 kg)  BMI 24.37 kg/m2 Patient is very pleasant and in no acute distress. She is deaf. Skin is warm and dry. Color is normal.  HEENT is unremarkable. Normocephalic/atraumatic. PERRL. Sclera are nonicteric. Neck is supple. No masses. No JVD. Lungs are clear. Cardiac exam shows a regular rate and rhythm. Abdomen is soft. Extremities are without edema. Her pulses in her feet are very diminished. She has lots of varicosities. Gait and ROM are intact. No gross neurologic deficits noted.  LABORATORY DATA:   Assessment / Plan:

## 2012-03-06 ENCOUNTER — Encounter (INDEPENDENT_AMBULATORY_CARE_PROVIDER_SITE_OTHER): Payer: Medicare Other

## 2012-03-06 ENCOUNTER — Other Ambulatory Visit: Payer: Self-pay

## 2012-03-06 DIAGNOSIS — I739 Peripheral vascular disease, unspecified: Secondary | ICD-10-CM

## 2012-03-06 DIAGNOSIS — I70229 Atherosclerosis of native arteries of extremities with rest pain, unspecified extremity: Secondary | ICD-10-CM

## 2012-03-06 DIAGNOSIS — I70219 Atherosclerosis of native arteries of extremities with intermittent claudication, unspecified extremity: Secondary | ICD-10-CM

## 2012-03-18 ENCOUNTER — Encounter: Payer: Self-pay | Admitting: Cardiovascular Disease

## 2012-03-18 ENCOUNTER — Ambulatory Visit (INDEPENDENT_AMBULATORY_CARE_PROVIDER_SITE_OTHER): Payer: Medicare Other | Admitting: Cardiovascular Disease

## 2012-03-18 VITALS — BP 200/91 | HR 59 | Wt 155.0 lb

## 2012-03-18 DIAGNOSIS — I739 Peripheral vascular disease, unspecified: Secondary | ICD-10-CM

## 2012-03-18 DIAGNOSIS — I779 Disorder of arteries and arterioles, unspecified: Secondary | ICD-10-CM

## 2012-03-18 LAB — BASIC METABOLIC PANEL
BUN: 26 mg/dL — ABNORMAL HIGH (ref 6–23)
Creatinine, Ser: 1.1 mg/dL (ref 0.4–1.2)
GFR: 51.82 mL/min — ABNORMAL LOW (ref >60.00)
Glucose, Bld: 92 mg/dL (ref 70–99)
Potassium: 4.6 mEq/L (ref 3.5–5.1)

## 2012-03-18 NOTE — Assessment & Plan Note (Signed)
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 She has stable claudication for the last 5 years. She has no rest pain or ulcerations. I will arrange a CT angiogram of the distal aorta, pelvic vessels with lower ext runoff to assess anatomy. At this time, favor conservative management given her age and stable disease. I will see her back in 3-4 weeks tod discuss.

## 2012-03-18 NOTE — Patient Instructions (Addendum)
Your physician recommends that you schedule a follow-up appointment in: 3-4 weeks.   Non-Cardiac CT Angiography (CTA), is a special type of CT scan that uses a computer to produce multi-dimensional views of major blood vessels throughout the body. In CT angiography, a contrast material is injected through an IV to help visualize the blood vessels  Do not eat or drink 4 hours prior to CT Scan.  Do not take Metformin the day of test and for 48 hours after test.

## 2012-03-18 NOTE — Progress Notes (Signed)
History of Present Illness: 78 yo WF with history of HTN, DM, HLD, deafness, GERD, hypothyroidism who is referred today for PV evaluation. She is followed in our cardiology office by Dr. Peter [name] and has been seen recently by Truitt Merle, NP when she reported pain in both legs with walking, resolving with rest. Non-invasive imaging shows reduced ABI bilaterally with suggestion of bilateral distal SFA occlusions with reconstitution in the popliteal artery by collaterals. She is here today with her daughter who is her sign language interpretor.   She tells me that she has been having pain in her legs with walking for five years. She can walk 100 feet before she has the onset of bilateral calf muscle cramping. This quickly resolves with rest. She has no rest pain or ulcerations. She has had varicosities for years but no prior diagnosis of peripheral arterial disease. Carotid artery dopplers in 2010 with mild bilateral carotid artery disease. ABI 0.37 on the right and 0.42 on the left. There appears to be chronic total occlusion of both SFA in the distal segments with reconstitution at the popliteal via collaterals.    Past Medical History  Diagnosis Date  . Hypertension   . Diabetes mellitus     type 2  . Hypercholesterolemia   . Deafness   . Carotid bruit   . GERD (gastroesophageal reflux disease)   . Glaucoma   . Hypothyroidism   . PAT (paroxysmal atrial tachycardia)   . PAD (peripheral artery disease)     Past Surgical History  Procedure Date  . Cholycystectomy   . Total knee arthroplasty     right  . Left breast cyst removed     Current Outpatient Prescriptions  Medication Sig Dispense Refill  . aspirin 81 MG tablet Take 81 mg by mouth daily.        [name] atorvastatin (LIPITOR) 40 MG tablet Take 40 mg by mouth daily.        . Calcium Carbonate-Vitamin D (CALCIUM 600 + D PO) Take 600 mg by mouth daily.        . citalopram (CELEXA) 20 MG tablet 20 mg daily.       . cloNIDine  (CATAPRES) 0.2 MG tablet Take 1 tablet (0.2 mg total) by mouth at bedtime.  30 tablet  6  . dorzolamide-timolol (COSOPT) 22.3-6.8 MG/ML ophthalmic solution Place 1 drop into both eyes 2 (two) times daily.       [name] KLOR-CON 10 10 MEQ CR tablet Take 1 tablet by mouth Daily.      [name] latanoprost (XALATAN) 0.005 % ophthalmic solution 1 drop at bedtime.        [name] levothyroxine (SYNTHROID, LEVOTHROID) 50 MCG tablet Take 50 mcg by mouth daily.        . metoprolol (TOPROL-XL) 50 MG 24 hr tablet Take 50 mg by mouth daily.        . valsartan-hydrochlorothiazide (DIOVAN-HCT) 320-25 MG per tablet Take 1 tablet by mouth daily.  30 tablet  3  . VITAMIN D, CHOLECALCIFEROL, PO Take 600 mg by mouth daily.          Allergies  Allergen Reactions  . Amlodipine   . Benicar (Olmesartan Medoxomil)     History   Social History  . Marital Status: Married    Spouse Name: N/A    Number of Children: 4  . Years of Education: N/A   Occupational History  . Not on file.   Social History Main Topics  . Smoking status:  Never Smoker   . Smokeless tobacco: Not on file  . Alcohol Use: No  . Drug Use: No  . Sexually Active: No   Other Topics Concern  . Not on file   Social History Narrative  . No narrative on file    Family History  Problem Relation Age of Onset  . Heart disease Mother   . Liver cancer Father   . Heart disease Sister   . Heart disease Brother   . Stroke Sister     Review of Systems:  As stated in the HPI and otherwise negative.   BP 200/91  Pulse 59  Wt 155 lb (70.308 kg)  Physical Examination: General: Well developed, well nourished, NAD HEENT: OP clear, mucus membranes moist SKIN: warm, dry. No rashes. Neuro: No focal deficits Musculoskeletal: Muscle strength 5/5 all ext Psychiatric: Mood and affect normal Neck: No JVD, no carotid bruits, no thyromegaly, no lymphadenopathy. Lungs:Clear bilaterally, no wheezes, rhonci, crackles Cardiovascular: Regular rate and rhythm. No  murmurs, gallops or rubs. Abdomen:Soft. Bowel sounds present. Non-tender.  Extremities: No lower extremity edema. Pulses are non-palpable in the bilateral DP/PT. There are scattered reticular veins around both feet and ankles. No ulcerations. Feet are both cool to touch.   EKG: Sinus brady, rate 59 bpm.

## 2012-03-19 ENCOUNTER — Telehealth: Payer: Self-pay | Admitting: Cardiovascular Disease

## 2012-03-19 DIAGNOSIS — E039 Hypothyroidism, unspecified: Secondary | ICD-10-CM

## 2012-03-19 NOTE — Telephone Encounter (Signed)
[name]' daughter, Veronica Young, called to say she checked over her mother's meds as requested by Fraser Din, Dr Camillia Herter nurse and she is not taking any medication for diabetes.  She also states that the levothyroxine dose is incorrect.  When she checked the bottle, she is actually taking 1mcg daily.

## 2012-03-19 NOTE — Telephone Encounter (Signed)
New Msg: Pt daughter, [name], calling wanting to speak with nurse regarding pt medication.   Pt is no longer taking medication for diabetes. Daughter was unsure exactly how long pt has not been taking medication.  Pt daughter also wanted to speak with nurse regarding pt thyroid medication. Pt paperwork stated that pt took 50 mg; however pt RX states that she takes 75 mg.   Please return pt call to discuss further.

## 2012-03-23 MED ORDER — LEVOTHYROXINE SODIUM 75 MCG PO TABS: 75.0000 ug | ORAL_TABLET | Freq: Every day | ORAL | Status: DC

## 2012-03-23 NOTE — Telephone Encounter (Signed)
Will update levothyroxine on med list

## 2012-03-24 ENCOUNTER — Ambulatory Visit (INDEPENDENT_AMBULATORY_CARE_PROVIDER_SITE_OTHER)
Admission: RE | Admit: 2012-03-24 | Discharge: 2012-03-24 | Disposition: A | Discharge status: Home / Self Care | Payer: Medicare Other | Source: Ambulatory Visit | Attending: Cardiovascular Disease | Admitting: Cardiovascular Disease

## 2012-03-24 DIAGNOSIS — I779 Disorder of arteries and arterioles, unspecified: Secondary | ICD-10-CM

## 2012-03-24 DIAGNOSIS — I739 Peripheral vascular disease, unspecified: Secondary | ICD-10-CM

## 2012-03-24 MED ORDER — IOHEXOL 350 MG/ML SOLN
100.0000 mL | Freq: Once | INTRAVENOUS | Status: AC | PRN
  Administered 2012-03-24: 100 mL via INTRAVENOUS

## 2012-03-26 ENCOUNTER — Telehealth: Payer: Self-pay | Admitting: Cardiology

## 2012-03-26 NOTE — Telephone Encounter (Signed)
Please return call to patient daughter [name] regarding patient CT scan, she can be reached at [phone].

## 2012-03-26 NOTE — Telephone Encounter (Signed)
Spoke with pt's daughter and reviewed CT results.

## 2012-03-26 NOTE — Telephone Encounter (Signed)
Left message to call back  

## 2012-03-26 NOTE — Telephone Encounter (Signed)
Patient's daughter [name] called, was told Chest CT results not available yet.

## 2012-03-26 NOTE — Telephone Encounter (Signed)
Fu call Pt's daughter returning your call

## 2012-03-31 ENCOUNTER — Encounter (INDEPENDENT_AMBULATORY_CARE_PROVIDER_SITE_OTHER): Payer: Medicare Other | Admitting: Ophthalmology

## 2012-03-31 DIAGNOSIS — H348192 Central retinal vein occlusion, unspecified eye, stable: Secondary | ICD-10-CM

## 2012-03-31 DIAGNOSIS — H35039 Hypertensive retinopathy, unspecified eye: Secondary | ICD-10-CM

## 2012-03-31 DIAGNOSIS — I10 Essential (primary) hypertension: Secondary | ICD-10-CM

## 2012-03-31 DIAGNOSIS — H43819 Vitreous degeneration, unspecified eye: Secondary | ICD-10-CM

## 2012-03-31 DIAGNOSIS — E11319 Type 2 diabetes mellitus with unspecified diabetic retinopathy without macular edema: Secondary | ICD-10-CM

## 2012-03-31 DIAGNOSIS — E1139 Type 2 diabetes mellitus with other diabetic ophthalmic complication: Secondary | ICD-10-CM

## 2012-04-16 ENCOUNTER — Ambulatory Visit: Payer: Medicare Other | Admitting: Cardiovascular Disease

## 2012-04-16 ENCOUNTER — Ambulatory Visit (INDEPENDENT_AMBULATORY_CARE_PROVIDER_SITE_OTHER): Payer: Medicare Other | Admitting: Cardiovascular Disease

## 2012-04-16 ENCOUNTER — Encounter: Payer: Self-pay | Admitting: Cardiovascular Disease

## 2012-04-16 VITALS — BP 180/80 | HR 78 | Ht 65.0 in | Wt 156.0 lb

## 2012-04-16 DIAGNOSIS — I779 Disorder of arteries and arterioles, unspecified: Secondary | ICD-10-CM

## 2012-04-16 DIAGNOSIS — I739 Peripheral vascular disease, unspecified: Secondary | ICD-10-CM

## 2012-04-16 MED ORDER — CILOSTAZOL 50 MG PO TABS: 50.0000 mg | ORAL_TABLET | Freq: Two times a day (BID) | ORAL | Status: DC

## 2012-04-16 NOTE — Assessment & Plan Note (Signed)
She has severe disease in both legs with bilateral popliteal artery occlusions. Will manage conservatively with Pletal 50 mg po BID. Will repeat ABI in 6 months and see her then. I have explained that stenting is not effective behind the knee joint as risk of occlusion is high. If she develops rest pain or ulcerations, will have to consider bypass.

## 2012-04-16 NOTE — Patient Instructions (Signed)
Your physician wants you to follow-up in: 6 months. You will receive a reminder letter in the mail two months in advance. If you don't receive a letter, please call our office to schedule the follow-up appointment.  Your physician has recommended you make the following change in your medication: Start Pletal 50 mg by mouth twice daily  Your physician has requested that you have an ankle brachial index (ABI). During this test an ultrasound and blood pressure cuff are used to evaluate the arteries that supply the arms and legs with blood. Allow thirty minutes for this exam. There are no restrictions or special instructions. To be done in 6 months.

## 2012-04-16 NOTE — Progress Notes (Signed)
History of Present Illness: 77 yo WF with history of HTN, DM, HLD, deafness, GERD, hypothyroidism who is here today for PV follow up. I saw her 03/18/12 for PV evaluation. She is followed in our cardiology office by Dr. Peter [name] and was been seen recently by Truitt Merle, NP when she reported pain in both legs with walking, resolving with rest. Non-invasive imaging shows reduced ABI bilaterally with suggestion of bilateral distal SFA occlusions with reconstitution in the popliteal artery by collaterals. She told me that she has been having pain in her legs with walking for five years. She can walk 100 feet before she has the onset of bilateral calf muscle cramping. This quickly resolves with rest. She has no rest pain or ulcerations. She has had varicosities for years but no prior diagnosis of peripheral arterial disease. Carotid artery dopplers in 2010 with mild bilateral carotid artery disease. ABI 0.37 on the right and 0.42 on the left. 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 I had hoped we could pursue conservative management. I arranged a CT angiogram of the distal aorta and bilateral lower extremities which showed bilateral popliteal artery occlusions. She does not have flow limiting disease in the iliacs, common femorals or proximal or mid segments of bilateral SFA.   She is here for f/u and feels great. Occasional pain left thigh and calf at night. NO rest pain during day or ulcerations. Walking around ok.    Primary Care Physician:  Fernande Boyden ([place])  Last Lipid Profile:  Past Medical History  Diagnosis Date  . Hypertension   . Diabetes mellitus     type 2  . Hypercholesterolemia   . Deafness   . Carotid bruit   . GERD (gastroesophageal reflux disease)   . Glaucoma   . Hypothyroidism   . PAT (paroxysmal atrial tachycardia)   . PAD (peripheral artery disease)     Past Surgical  History  Procedure Date  . Cholycystectomy   . Total knee arthroplasty     right  . Left breast cyst removed     Current Outpatient Prescriptions  Medication Sig Dispense Refill  . aspirin 81 MG tablet Take 81 mg by mouth daily.        [name] atorvastatin (LIPITOR) 40 MG tablet Take 40 mg by mouth daily.        . Calcium Carbonate-Vitamin D (CALCIUM 600 + D PO) Take 600 mg by mouth daily.        . citalopram (CELEXA) 20 MG tablet 20 mg daily.       . cloNIDine (CATAPRES) 0.2 MG tablet Take 1 tablet (0.2 mg total) by mouth at bedtime.  30 tablet  6  . dorzolamide-timolol (COSOPT) 22.3-6.8 MG/ML ophthalmic solution Place 1 drop into both eyes 2 (two) times daily.       [name] KLOR-CON 10 10 MEQ CR tablet Take 1 tablet by mouth Daily.      [name] latanoprost (XALATAN) 0.005 % ophthalmic solution 1 drop at bedtime.        [name] levothyroxine (SYNTHROID, LEVOTHROID) 75 MCG tablet Take 1 tablet (75 mcg total) by mouth daily.  30 tablet  6  . metoprolol (TOPROL-XL) 50 MG 24 hr tablet Take 50 mg by mouth daily.        . valsartan-hydrochlorothiazide (DIOVAN-HCT) 320-25 MG per tablet Take 1 tablet by mouth daily.  30 tablet  3  . VITAMIN  D, CHOLECALCIFEROL, PO Take 600 mg by mouth daily.          Allergies  Allergen Reactions  . Amlodipine   . Benicar (Olmesartan Medoxomil)     History   Social History  . Marital Status: Married    Spouse Name: N/A    Number of Children: 4  . Years of Education: N/A   Occupational History  . Not on file.   Social History Main Topics  . Smoking status: Never Smoker   . Smokeless tobacco: Not on file  . Alcohol Use: No  . Drug Use: No  . Sexually Active: No   Other Topics Concern  . Not on file   Social History Narrative  . No narrative on file    Family History  Problem Relation Age of Onset  . Heart disease Mother   . Liver cancer Father   . Heart disease Sister   . Heart disease Brother   . Stroke Sister     Review of Systems:  As stated in the  HPI and otherwise negative.   BP 180/80  Pulse 78  Ht 5\' 5"  (1.651 m)  Wt 156 lb (70.761 kg)  BMI 25.96 kg/m2  Physical Examination: General: Well developed, well nourished, NAD HEENT: OP clear, mucus membranes moist SKIN: warm, dry. No rashes. Neuro: No focal deficits Musculoskeletal: Muscle strength 5/5 all ext Psychiatric: Mood and affect normal Neck: No JVD, no carotid bruits, no thyromegaly, no lymphadenopathy. Lungs:Clear bilaterally, no wheezes, rhonci, crackles Cardiovascular: Regular rate and rhythm. No murmurs, gallops or rubs. Abdomen:Soft. Bowel sounds present. Non-tender.  Extremities: No lower extremity edema. Pulses are non-palpabe in the bilateral DP/PT. Reticular veins both ankles and feet  CT angiogram 03/18/12:  The aorta is nonaneurysmal and patent. Mild atherosclerotic plaque at the level of the SMA takeoff. The SMA is patent. There is mild narrowing at the origin of the celiac axis. The right renal artery is patent. There is moderate disease at the origin of the left renal artery. IMA is diminutive and patent.  Bilateral common and external iliac arteries are widely patent with minimal atherosclerotic changes. Right internal iliac artery is patent. 50% narrowing at the origin of the left internal iliac artery.  Right lower extremity runoff demonstrates patency of the right common femoral and profunda femoral artery. At the abductor canal, there are at least two areas of significant narrowing. The popliteal artery is occluded well above the knee joint.  The distal popliteal artery faintly opacifies. Total knee arthroplasty cause significant streak artifact limiting evaluation of the popliteal artery. There is a very poor opacification of the tibial vessels with intermittent filling of the peroneal and anterior tibial arteries.  Left common femoral and profunda femoral arteries are patent. The left superficial femoral artery is patent to the level of  the abductor canal. It is significantly diseased, than the popliteal artery is occluded well above the knee joint.  The tibioperoneal trunk and the anterior tibial artery faintly opacified just beyond their origin. The posterior tibial artery reconstitutes from the mid leg. The anterior tibial artery is patent and diseased throughout the leg. Peroneal, posterior tibial artery, and anterior tibial artery remains patent and moderately diseased throughout the leg. Again, there is very poor filling likely due to slow flow.  6 mm right lower lobe pulmonary nodule on image 14.  Post cholecystectomy. Liver, spleen, pancreas are within normal limits. Mild chronic changes of the kidneys. Right adrenal gland is unremarkable. 1.4 x 8 mm  left adrenal nodule. It is nonspecific by imaging characteristics.  Grade 1 L5-S1 spondylolisthesis. No free fluid. Uterus adnexa are within normal limits.  Review of the MIP images confirms the above findings.  IMPRESSION: Bilateral popliteal artery occlusion well above the knee joint. There is poor filling of the tibial arteries bilaterally likely due to slow flow.  On the right, there is minimal filling of the popliteal artery below the knee with intermittent filling of the anterior tibial and peroneal arteries.  On the left, the tibial vessels reconstitute in the proximal leg and are moderately diseased throughout the course.  Overall imaging of the tibial arteries is very limited and prior to intervention, dedicated angiogram may be necessary for planning.  6 mm right lower lobe pulmonary nodule. If the patient is at high risk for bronchogenic carcinoma, follow-up chest CT at 6-12 months is recommended.

## 2012-05-01 ENCOUNTER — Encounter (INDEPENDENT_AMBULATORY_CARE_PROVIDER_SITE_OTHER): Payer: Medicare Other | Admitting: Ophthalmology

## 2012-05-01 DIAGNOSIS — H35039 Hypertensive retinopathy, unspecified eye: Secondary | ICD-10-CM

## 2012-05-01 DIAGNOSIS — I10 Essential (primary) hypertension: Secondary | ICD-10-CM

## 2012-05-01 DIAGNOSIS — H348192 Central retinal vein occlusion, unspecified eye, stable: Secondary | ICD-10-CM

## 2012-05-01 DIAGNOSIS — H43819 Vitreous degeneration, unspecified eye: Secondary | ICD-10-CM

## 2012-05-06 ENCOUNTER — Ambulatory Visit (INDEPENDENT_AMBULATORY_CARE_PROVIDER_SITE_OTHER): Payer: Medicare Other | Admitting: Ophthalmology

## 2012-05-06 DIAGNOSIS — H348192 Central retinal vein occlusion, unspecified eye, stable: Secondary | ICD-10-CM

## 2012-05-06 DIAGNOSIS — I10 Essential (primary) hypertension: Secondary | ICD-10-CM

## 2012-05-06 DIAGNOSIS — H43819 Vitreous degeneration, unspecified eye: Secondary | ICD-10-CM

## 2012-05-06 DIAGNOSIS — H35039 Hypertensive retinopathy, unspecified eye: Secondary | ICD-10-CM

## 2012-05-08 ENCOUNTER — Ambulatory Visit: Payer: Medicare Other | Admitting: Cardiovascular Disease

## 2012-05-20 ENCOUNTER — Ambulatory Visit: Payer: Medicare Other | Admitting: Cardiology

## 2012-05-29 ENCOUNTER — Encounter (INDEPENDENT_AMBULATORY_CARE_PROVIDER_SITE_OTHER): Payer: Medicare Other | Admitting: Ophthalmology

## 2012-05-29 DIAGNOSIS — H2700 Aphakia, unspecified eye: Secondary | ICD-10-CM

## 2012-05-29 DIAGNOSIS — H348192 Central retinal vein occlusion, unspecified eye, stable: Secondary | ICD-10-CM

## 2012-05-29 DIAGNOSIS — H43819 Vitreous degeneration, unspecified eye: Secondary | ICD-10-CM

## 2012-05-29 DIAGNOSIS — H35039 Hypertensive retinopathy, unspecified eye: Secondary | ICD-10-CM

## 2012-05-29 DIAGNOSIS — I10 Essential (primary) hypertension: Secondary | ICD-10-CM

## 2012-06-30 ENCOUNTER — Encounter (INDEPENDENT_AMBULATORY_CARE_PROVIDER_SITE_OTHER): Payer: Medicare Other | Admitting: Ophthalmology

## 2012-06-30 DIAGNOSIS — I10 Essential (primary) hypertension: Secondary | ICD-10-CM

## 2012-06-30 DIAGNOSIS — H348192 Central retinal vein occlusion, unspecified eye, stable: Secondary | ICD-10-CM

## 2012-06-30 DIAGNOSIS — H35039 Hypertensive retinopathy, unspecified eye: Secondary | ICD-10-CM

## 2012-06-30 DIAGNOSIS — H43819 Vitreous degeneration, unspecified eye: Secondary | ICD-10-CM

## 2012-06-30 DIAGNOSIS — H2700 Aphakia, unspecified eye: Secondary | ICD-10-CM

## 2012-07-21 ENCOUNTER — Other Ambulatory Visit: Payer: Self-pay | Admitting: Cardiology

## 2012-07-28 ENCOUNTER — Encounter (INDEPENDENT_AMBULATORY_CARE_PROVIDER_SITE_OTHER): Payer: Medicare Other | Admitting: Ophthalmology

## 2012-07-28 DIAGNOSIS — H35039 Hypertensive retinopathy, unspecified eye: Secondary | ICD-10-CM

## 2012-07-28 DIAGNOSIS — I10 Essential (primary) hypertension: Secondary | ICD-10-CM

## 2012-07-28 DIAGNOSIS — H348192 Central retinal vein occlusion, unspecified eye, stable: Secondary | ICD-10-CM

## 2012-08-11 IMAGING — CR DG CHEST 2V
2 series · 2 of 2 positions shown · non-contrast
Comparison: 09/27/2004

CLINICAL DATA: Preop.

CHEST - 2 VIEW

[w chest pa]
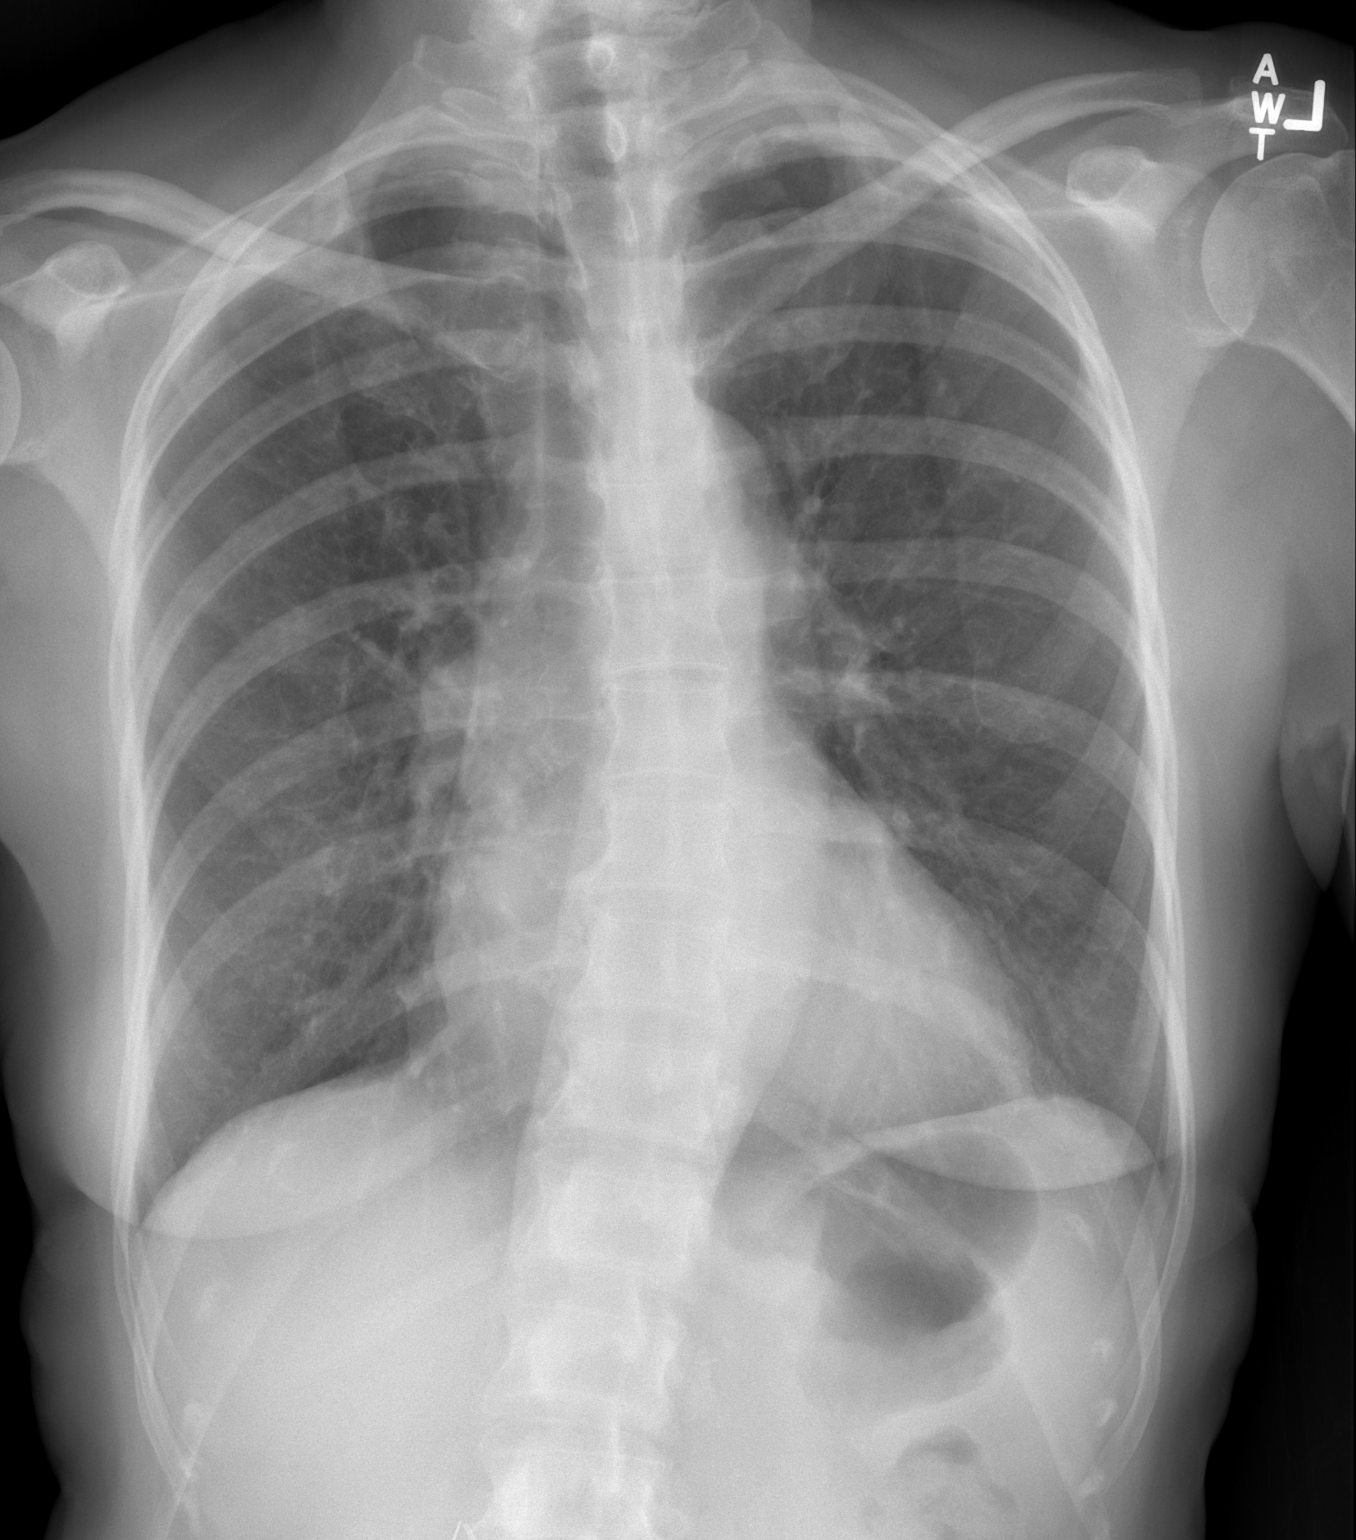

[w chest lat]
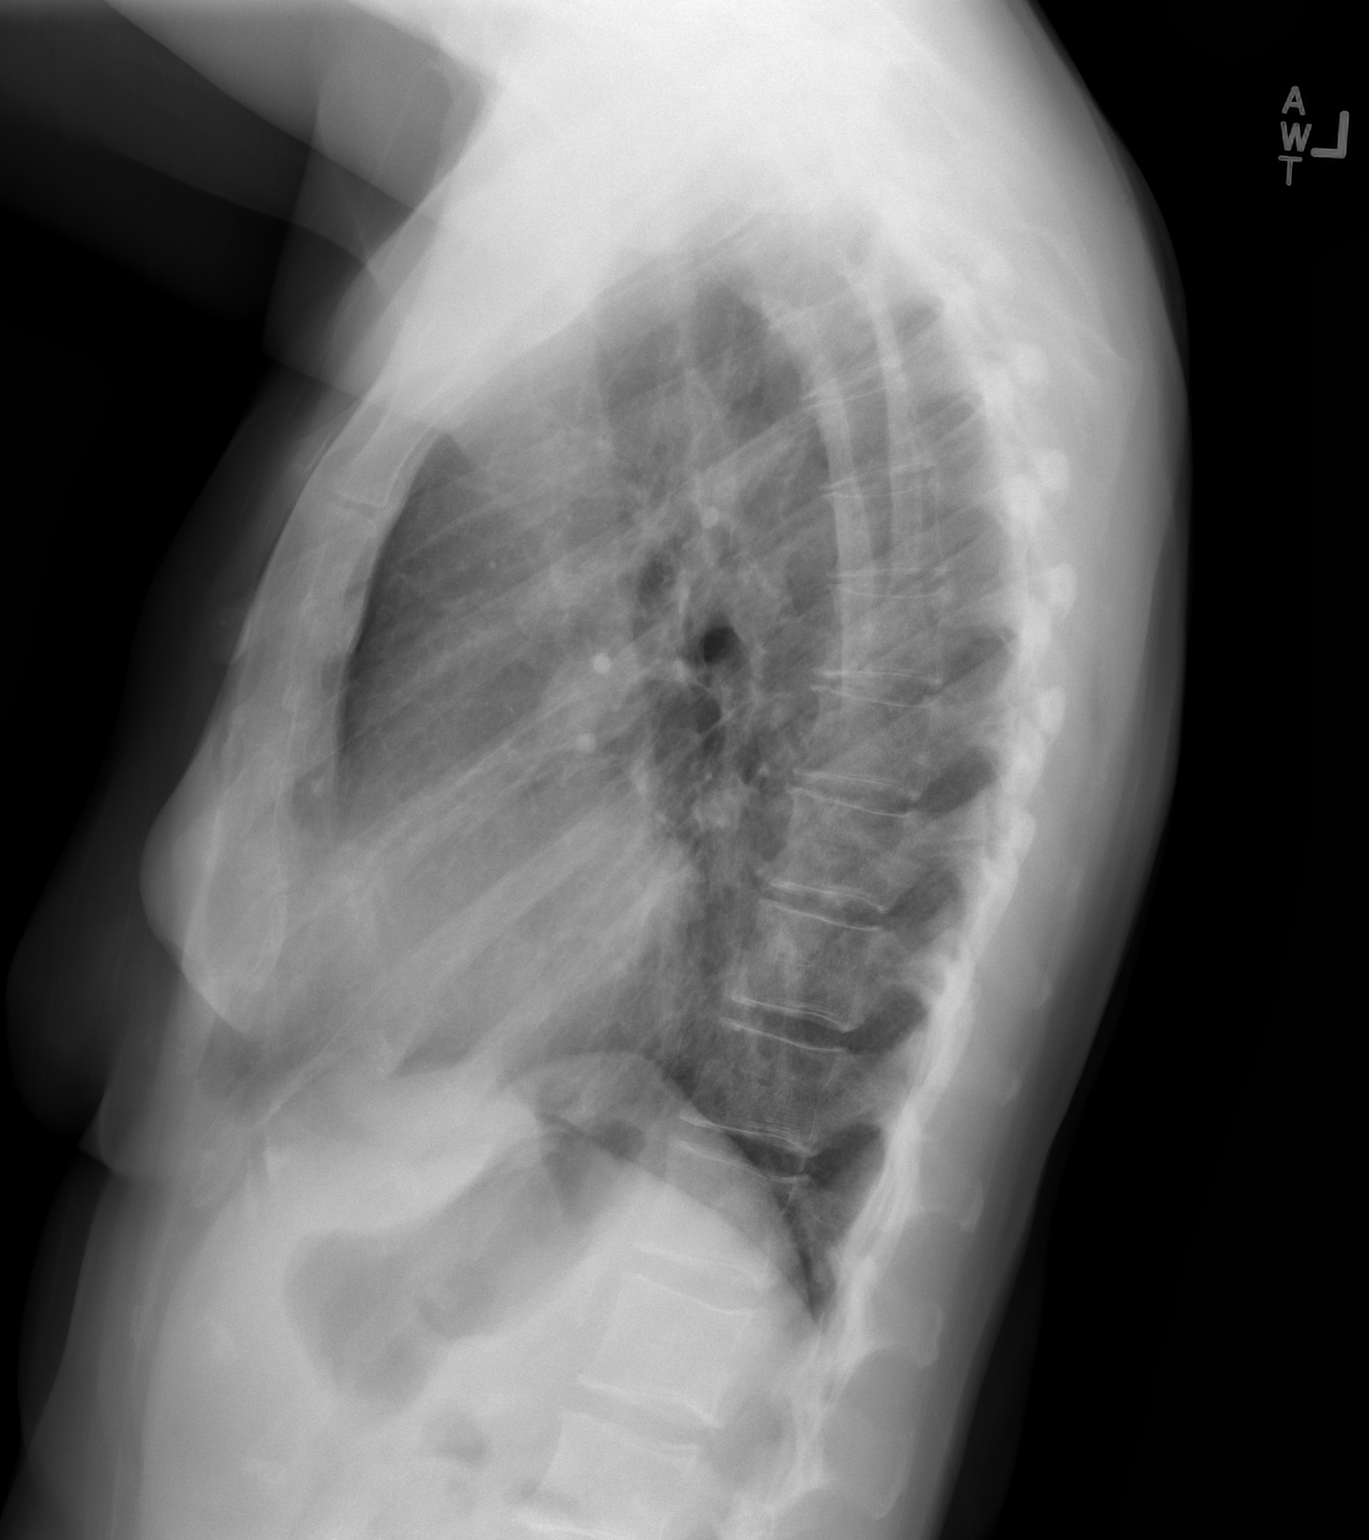

[2 of 2 positions shown; findings below may reference images not displayed]

FINDINGS: Trachea is midline.  Heart size stable.  Biapical pleural
parenchymal scarring.  Lungs are clear.  No pleural fluid.
IMPRESSION: No acute findings.

## 2012-08-27 ENCOUNTER — Encounter (INDEPENDENT_AMBULATORY_CARE_PROVIDER_SITE_OTHER): Payer: Medicare Other | Admitting: Ophthalmology

## 2012-08-27 DIAGNOSIS — H43819 Vitreous degeneration, unspecified eye: Secondary | ICD-10-CM

## 2012-08-27 DIAGNOSIS — I10 Essential (primary) hypertension: Secondary | ICD-10-CM

## 2012-08-27 DIAGNOSIS — H35039 Hypertensive retinopathy, unspecified eye: Secondary | ICD-10-CM

## 2012-08-27 DIAGNOSIS — H348192 Central retinal vein occlusion, unspecified eye, stable: Secondary | ICD-10-CM

## 2012-08-27 IMAGING — CR DG CHEST 1V PORT
1 series · 1 of 1 positions shown · non-contrast
Comparison: 06/19/2011

CLINICAL DATA: Chest pain and shortness of breath.  Low O2
saturation.

PORTABLE CHEST - 1 VIEW

[AP]
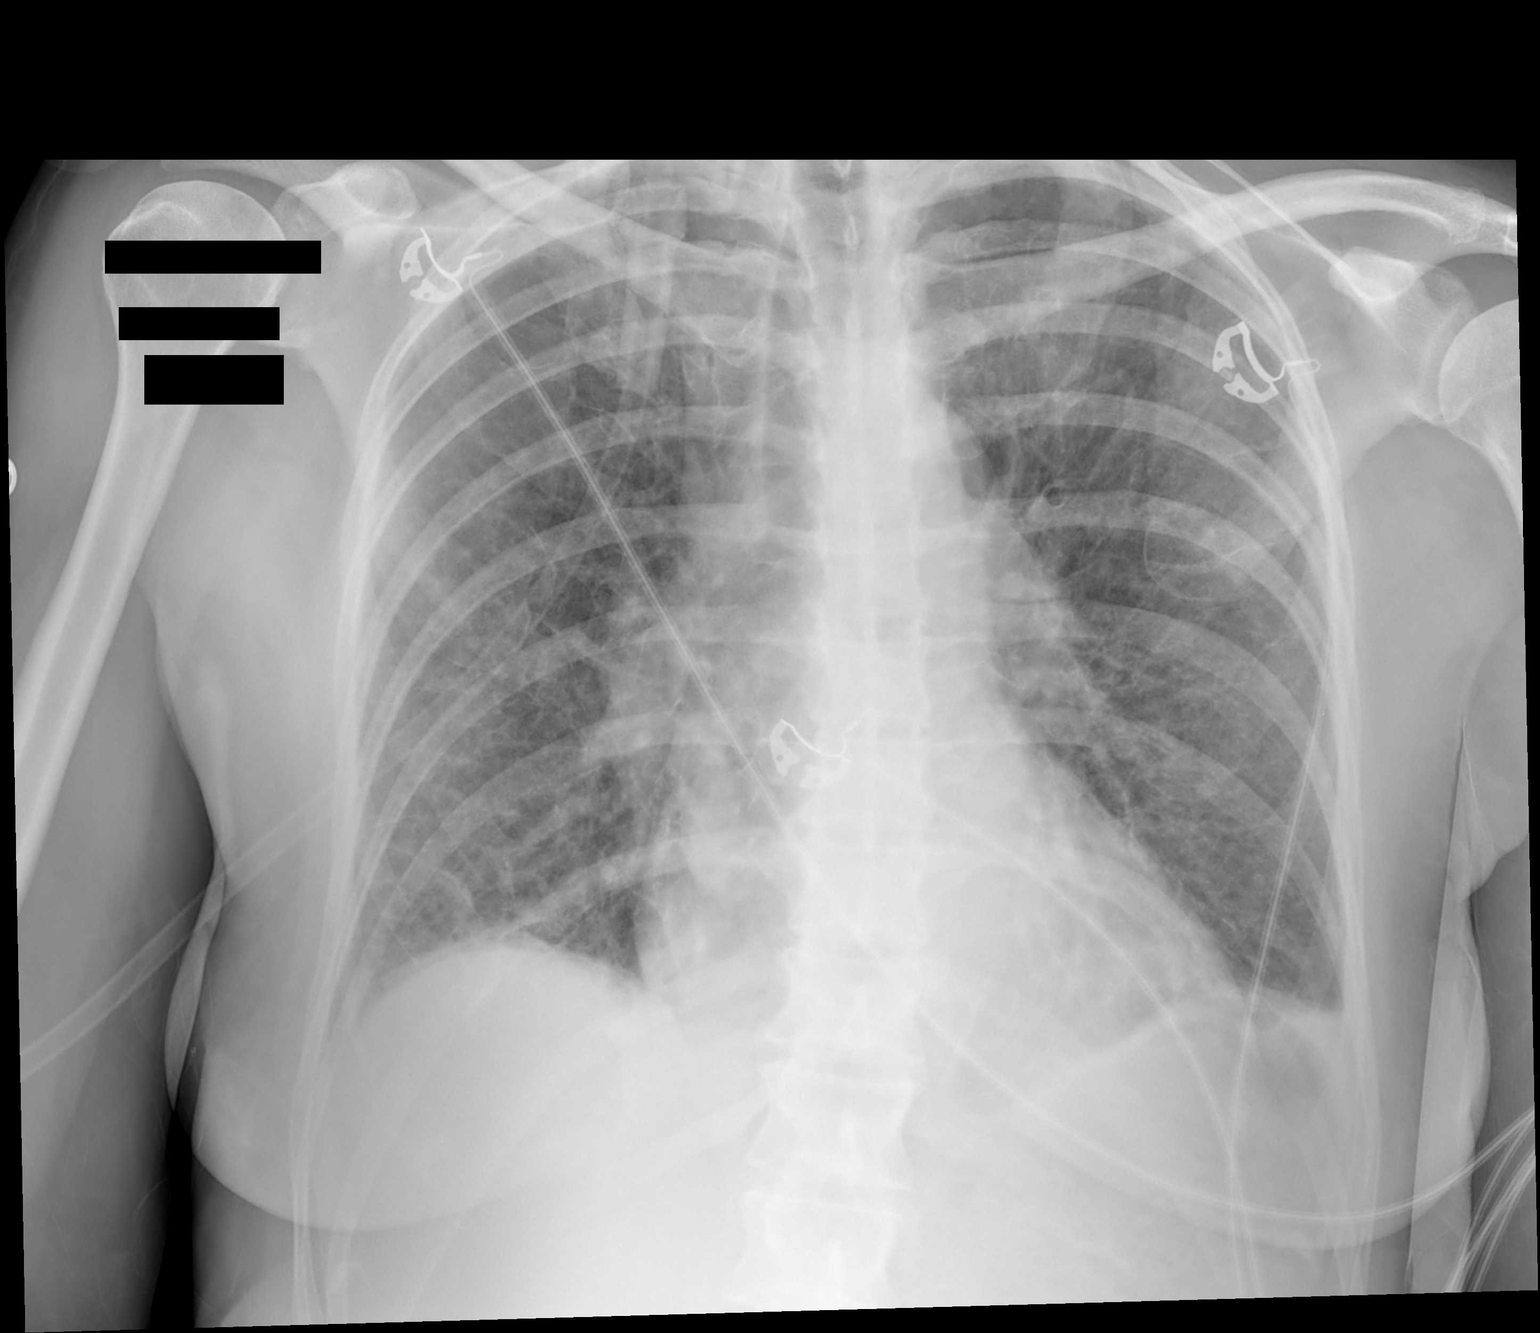

[1 of 1 positions shown; findings below may reference images not displayed]

FINDINGS: There is diffuse accentuation of the interstitial
markings in both lungs suggesting mild pulmonary edema.  Heart size
and vascularity are normal however.  Minimal atelectasis at the
lung bases.  No osseous abnormality.
IMPRESSION: Bilateral interstitial accentuation suggesting mild interstitial
edema.

## 2012-08-27 IMAGING — CR DG KNEE 1-2V PORT*R*
1 series · 2 of 2 positions shown · non-contrast
Comparison: None.

CLINICAL DATA: Postop right total knee arthroplasty.

PORTABLE RIGHT KNEE - 1-2 VIEW 07/05/2011:

[Series 1: AP · right · 2 of 2 slices shown]
[im 1/2]
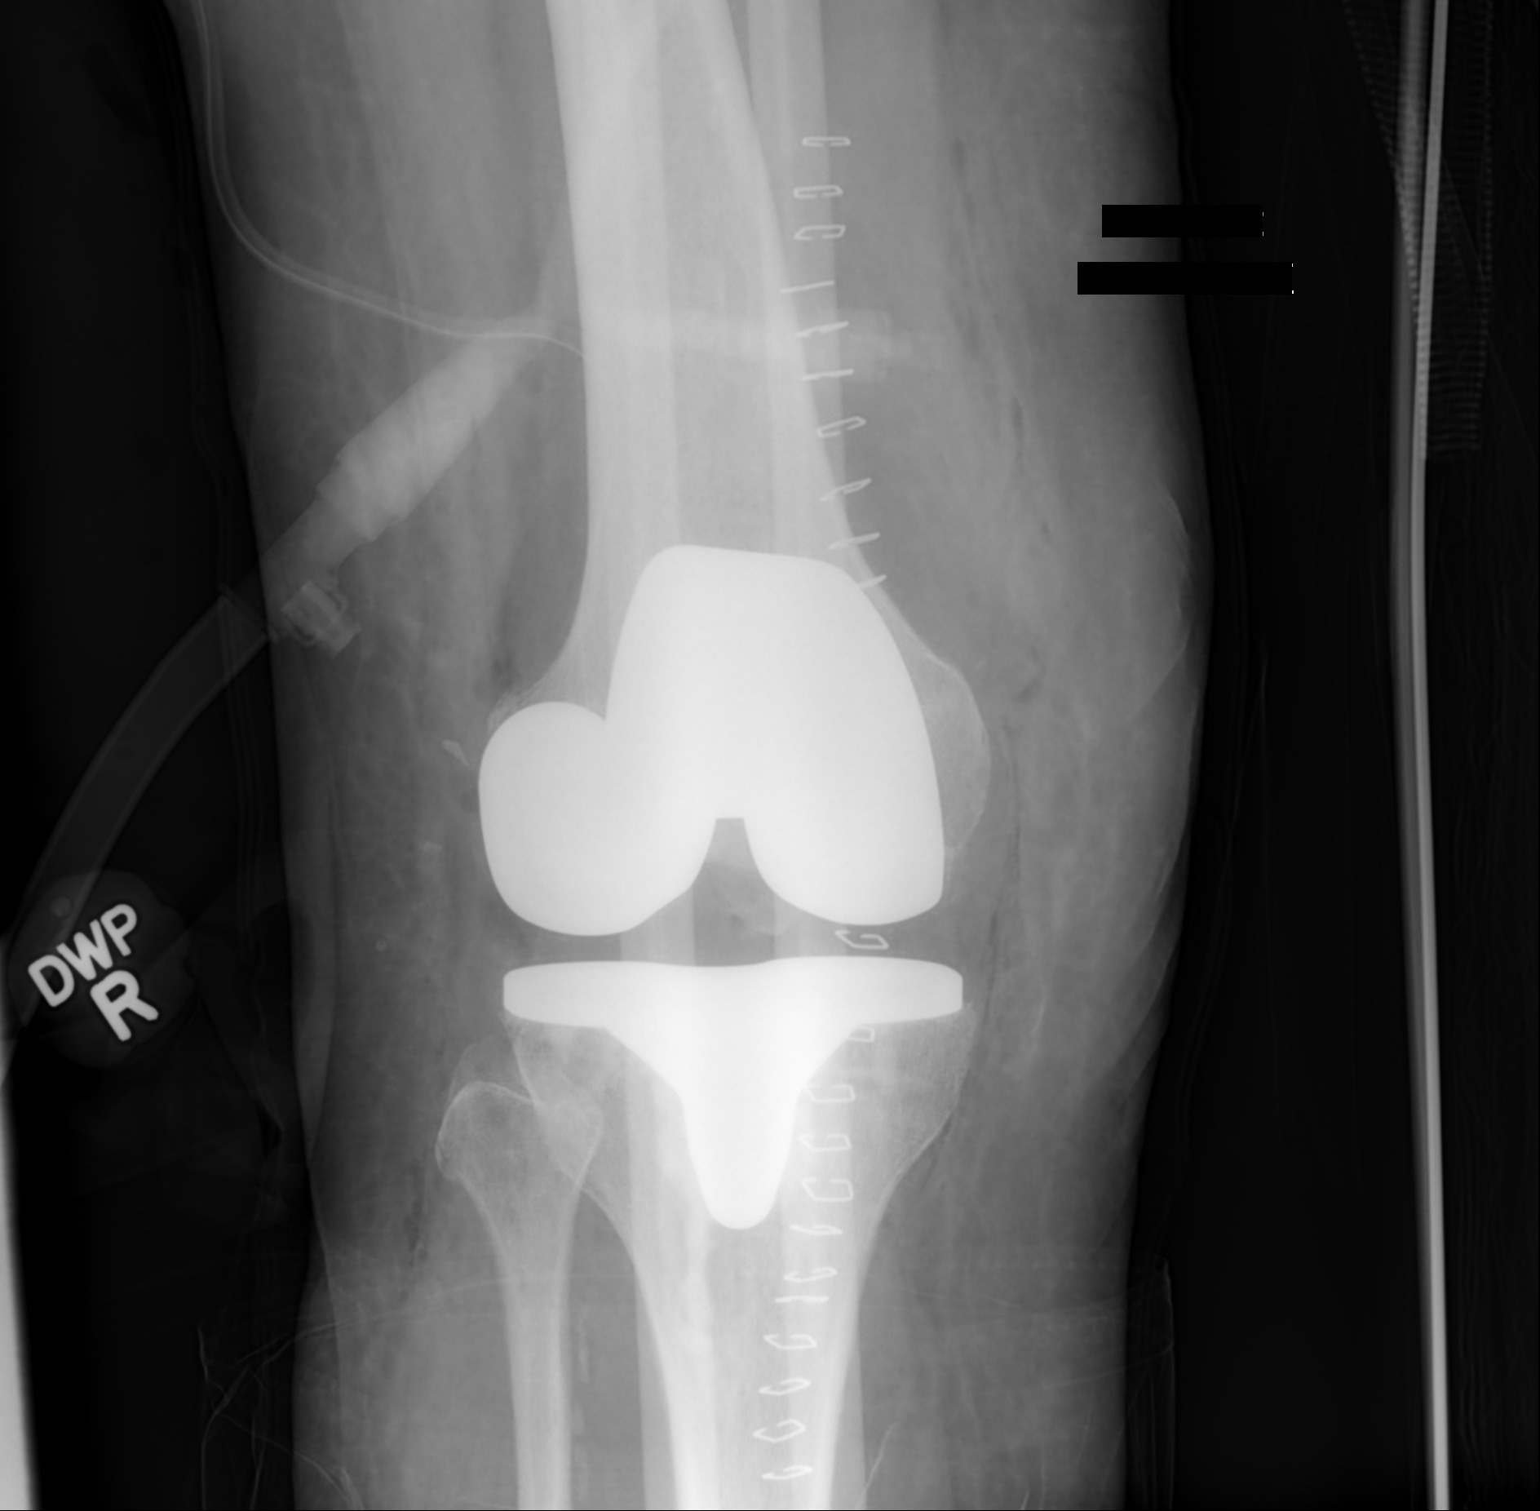
[im 2/2]
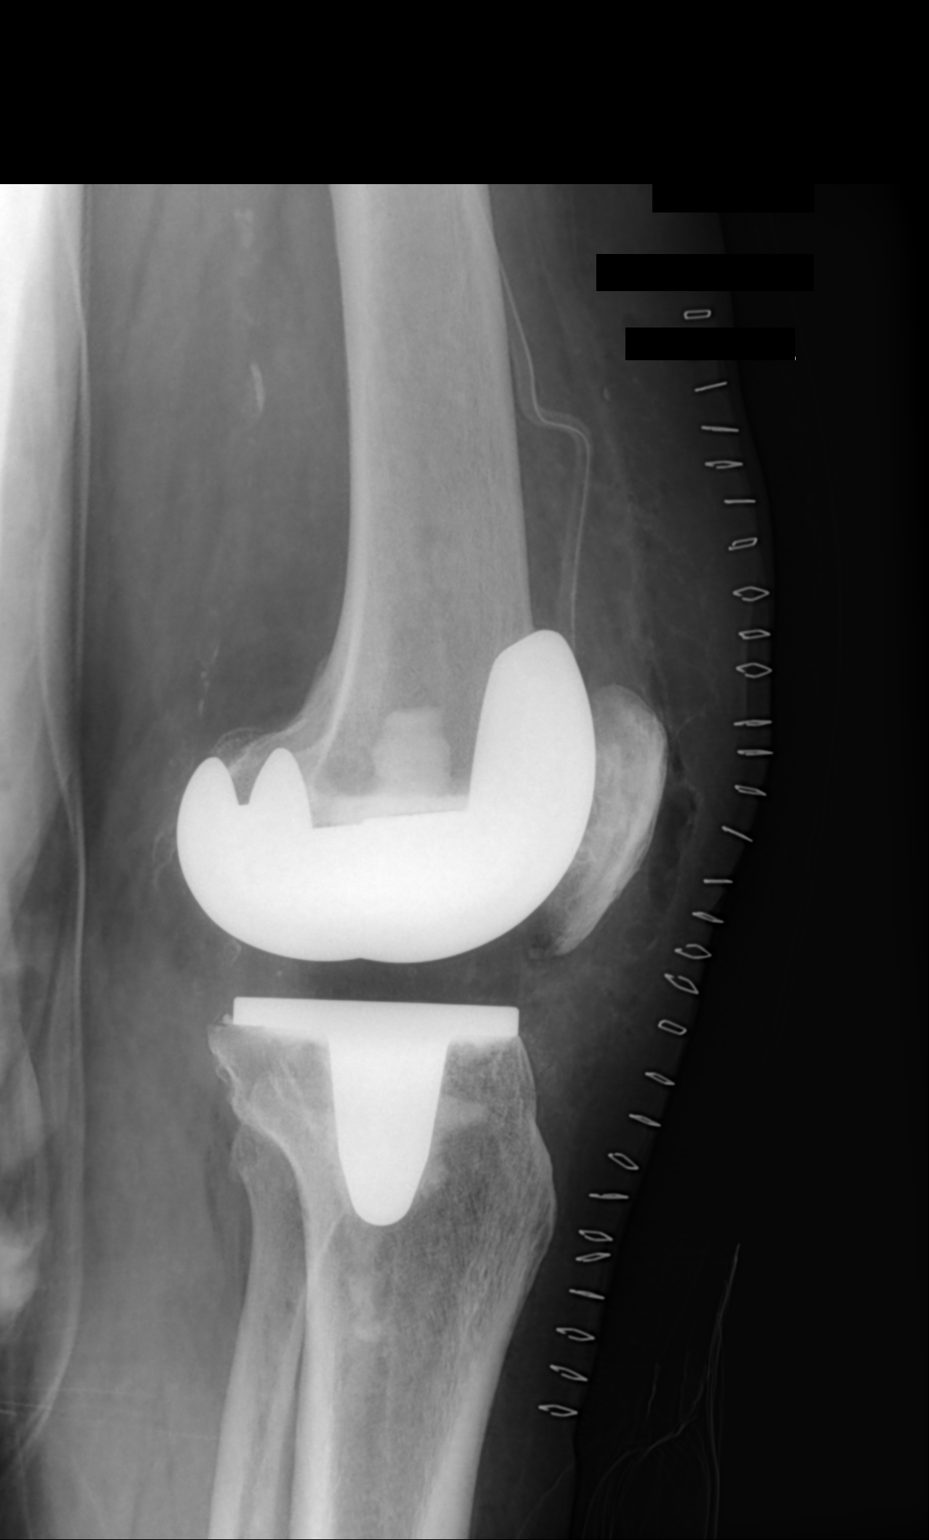

[2 of 2 positions shown; findings below may reference images not displayed]

FINDINGS: Right total knee arthroplasty with anatomic alignment.
No acute complicating features.  Surgical drain in place.
Atherosclerosis involving the femoral popliteal arteries.
IMPRESSION: Anatomic alignment post right total knee arthroplasty without acute
complicating features.

## 2012-08-31 IMAGING — CR DG CHEST 1V PORT
1 series · 1 of 1 positions shown · non-contrast
Comparison: 07/05/2011

CLINICAL DATA: Difficulty breathing.

PORTABLE CHEST - 1 VIEW

[AP]
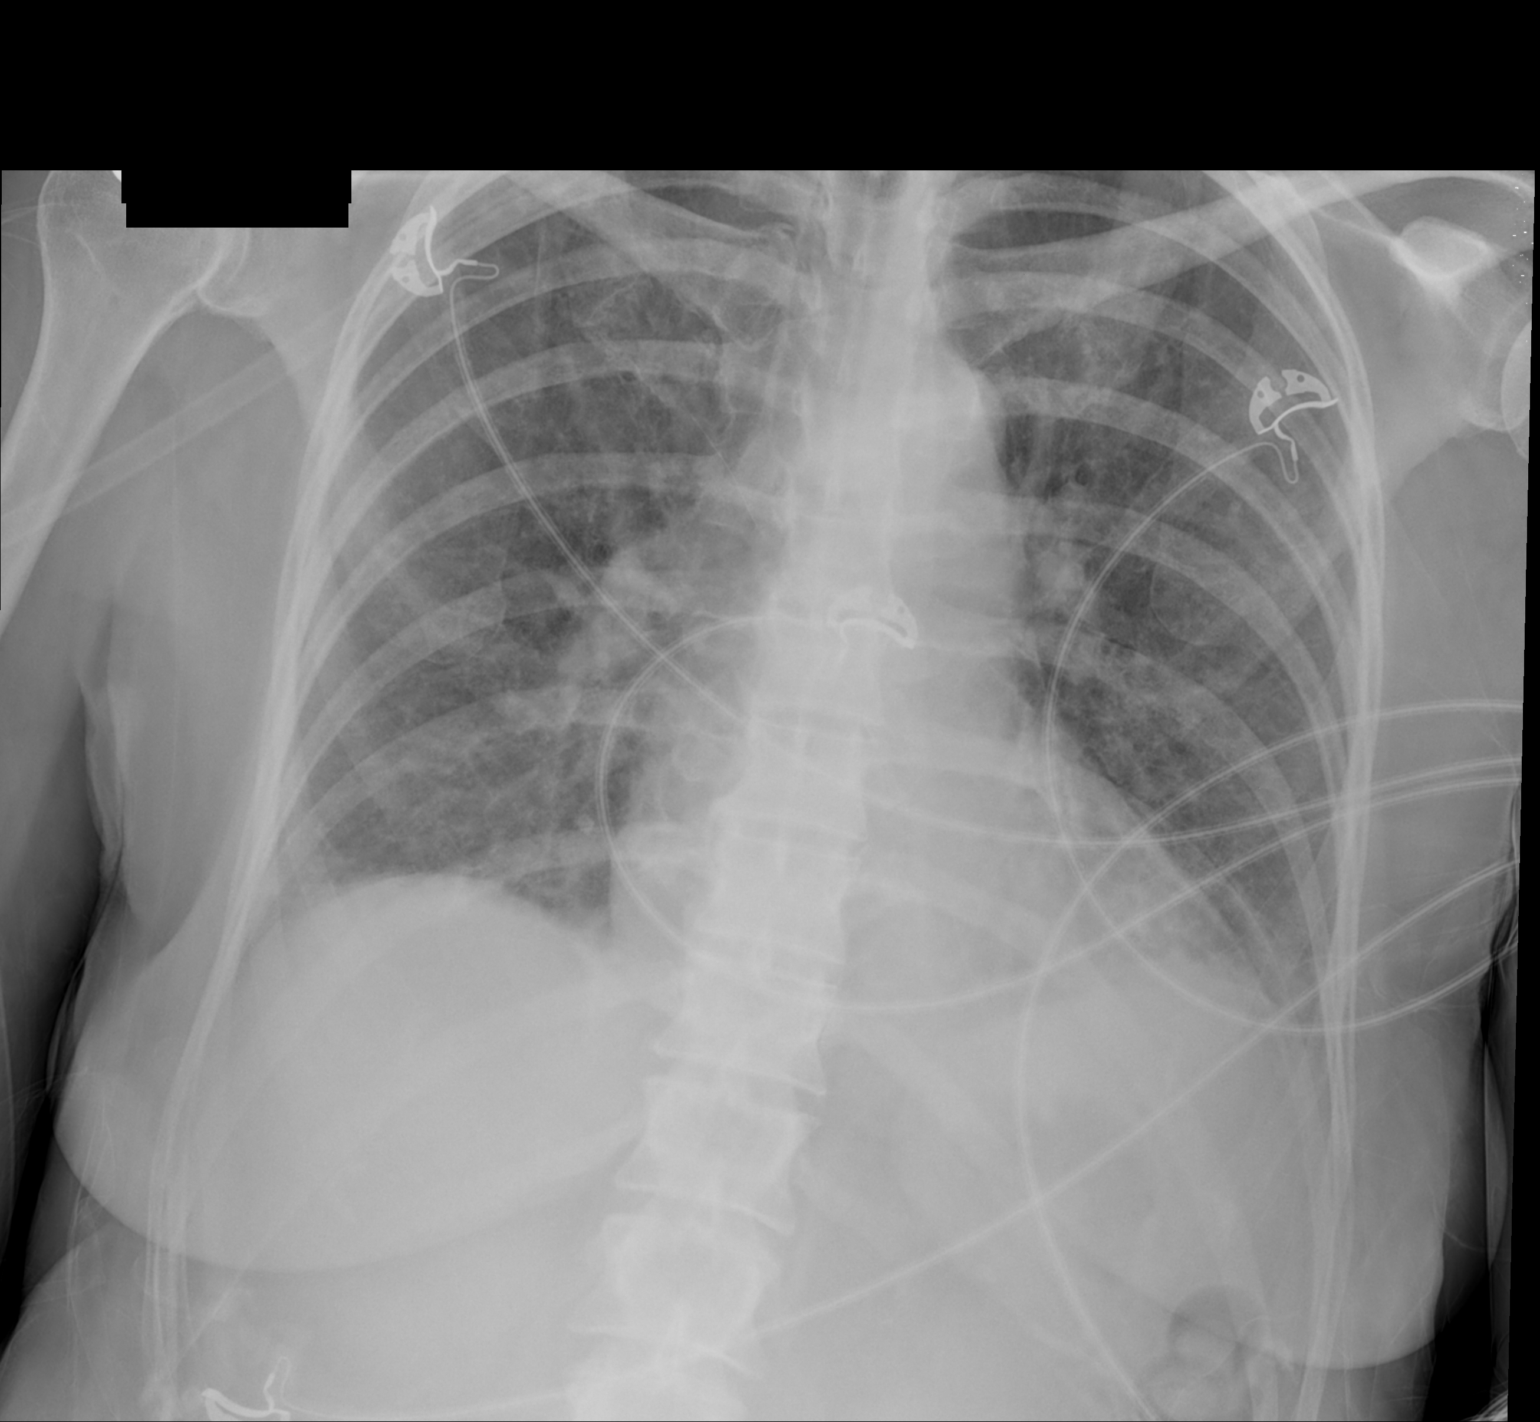

[1 of 1 positions shown; findings below may reference images not displayed]

FINDINGS: Heart size and vascularity are normal.  Interstitial
markings remain accentuated.  Tiny left effusion.  There has been
minimal improvement since the prior exam.
IMPRESSION: Mild interstitial pulmonary edema.  Small left effusion.

## 2012-09-24 ENCOUNTER — Encounter (INDEPENDENT_AMBULATORY_CARE_PROVIDER_SITE_OTHER): Payer: Medicare Other | Admitting: Ophthalmology

## 2012-09-24 DIAGNOSIS — I10 Essential (primary) hypertension: Secondary | ICD-10-CM

## 2012-09-24 DIAGNOSIS — H35039 Hypertensive retinopathy, unspecified eye: Secondary | ICD-10-CM

## 2012-09-24 DIAGNOSIS — H348192 Central retinal vein occlusion, unspecified eye, stable: Secondary | ICD-10-CM

## 2012-09-24 DIAGNOSIS — H43819 Vitreous degeneration, unspecified eye: Secondary | ICD-10-CM

## 2012-10-02 ENCOUNTER — Ambulatory Visit (INDEPENDENT_AMBULATORY_CARE_PROVIDER_SITE_OTHER): Payer: Medicare Other | Admitting: Cardiovascular Disease

## 2012-10-02 ENCOUNTER — Encounter: Payer: Self-pay | Admitting: Cardiovascular Disease

## 2012-10-02 VITALS — BP 165/80 | HR 77 | Ht 66.0 in | Wt 164.0 lb

## 2012-10-02 DIAGNOSIS — I739 Peripheral vascular disease, unspecified: Secondary | ICD-10-CM

## 2012-10-02 NOTE — Patient Instructions (Addendum)
Your physician wants you to follow-up in: 6 months. You will receive a reminder letter in the mail two months in advance. If you don't receive a letter, please call our office to schedule the follow-up appointment.  Your physician has requested that you have an ankle brachial index (ABI). During this test an ultrasound and blood pressure cuff are used to evaluate the arteries that supply the arms and legs with blood. Allow thirty minutes for this exam. There are no restrictions or special instructions.To be done in 6 months on day of appt with Dr. Angelena Form

## 2012-10-02 NOTE — Addendum Note (Signed)
Addended by: Thompson Grayer on: 10/02/2012 09:58 AM   Modules accepted: Orders

## 2012-10-02 NOTE — Progress Notes (Signed)
History of Present Illness: 78 yo WF with history of HTN, DM, HLD, deafness, GERD, hypothyroidism who is here today for PV follow up. She is followed in our cardiology office by Dr. Peter [name]. I last saw her in the [facility] clinic in April 2013. Non-invasive imaging March 2013 showed  reduced ABI bilaterally with suggestion of bilateral distal SFA occlusions with reconstitution in the popliteal artery by collaterals. She told me that she has been having pain in her legs with walking for five years. She can walk 100 feet before she has the onset of bilateral calf muscle cramping. This quickly resolves with rest. She has no rest pain or ulcerations. She has had varicosities for years but no prior diagnosis of peripheral arterial disease. Carotid artery dopplers in 2010 with mild bilateral carotid artery disease. In March 2013, ABI 0.37 on the right and 0.42 on the left. 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 I had hoped we could pursue conservative management. I arranged a CT angiogram of the distal aorta and bilateral lower extremities which showed bilateral popliteal artery occlusions. She does not have flow limiting disease in the iliacs, common femorals or proximal or mid segments of bilateral SFA.   She is here for f/u and feels great. She has mild leg pain with walking but quickly resolves with rest. No rest pain during day or ulcerations.   Primary Care Physician: Lorenza Burton  Past Medical History  Diagnosis Date  . Hypertension   . Diabetes mellitus     type 2  . Hypercholesterolemia   . Deafness   . Carotid bruit   . GERD (gastroesophageal reflux disease)   . Glaucoma   . Hypothyroidism   . PAT (paroxysmal atrial tachycardia)   . PAD (peripheral artery disease)     Past Surgical History  Procedure Date  . Cholycystectomy   . Total knee arthroplasty     right  . Left breast cyst removed      Current Outpatient Prescriptions  Medication Sig Dispense Refill  . aspirin 81 MG tablet Take 81 mg by mouth daily.        [name] atorvastatin (LIPITOR) 40 MG tablet Take 40 mg by mouth daily.        . Calcium Carbonate-Vitamin D (CALCIUM 600 + D PO) Take 600 mg by mouth daily.        . cilostazol (PLETAL) 50 MG tablet Take 1 tablet (50 mg total) by mouth 2 (two) times daily.  60 tablet  11  . citalopram (CELEXA) 20 MG tablet 20 mg daily.       . cloNIDine (CATAPRES) 0.2 MG tablet Take 1 tablet (0.2 mg total) by mouth at bedtime.  30 tablet  6  . dorzolamide-timolol (COSOPT) 22.3-6.8 MG/ML ophthalmic solution Place 1 drop into both eyes 2 (two) times daily.       [name] KLOR-CON 10 10 MEQ CR tablet Take 1 tablet by mouth Daily.      [name] latanoprost (XALATAN) 0.005 % ophthalmic solution 1 drop at bedtime.        [name] levothyroxine (SYNTHROID, LEVOTHROID) 75 MCG tablet Take 1 tablet (75 mcg total) by mouth daily.  30 tablet  6  . metoprolol (TOPROL-XL) 50 MG 24 hr tablet Take 50 mg by mouth daily.        . valsartan-hydrochlorothiazide (DIOVAN-HCT) 320-25 MG per tablet TAKE 1 TABLET BY MOUTH ONCE A DAY  30 tablet  3  . VITAMIN D, CHOLECALCIFEROL, PO Take 600 mg by mouth daily.          Allergies  Allergen Reactions  . Amlodipine   . Benicar (Olmesartan Medoxomil)     History   Social History  . Marital Status: Married    Spouse Name: N/A    Number of Children: 4  . Years of Education: N/A   Occupational History  . Not on file.   Social History Main Topics  . Smoking status: Never Smoker   . Smokeless tobacco: Not on file  . Alcohol Use: No  . Drug Use: No  . Sexually Active: No   Other Topics Concern  . Not on file   Social History Narrative  . No narrative on file    Family History  Problem Relation Age of Onset  . Heart disease Mother   . Liver cancer Father   . Heart disease Sister   . Heart disease Brother   . Stroke Sister     Review of Systems:  As stated in the  HPI and otherwise negative.   BP 165/80  Pulse 77  Ht 5\' 6"  (1.676 m)  Wt 164 lb (74.39 kg)  BMI 26.47 kg/m2  Physical Examination: General: Well developed, well nourished, NAD HEENT: OP clear, mucus membranes moist SKIN: warm, dry. No rashes. Neuro: No focal deficits Musculoskeletal: Muscle strength 5/5 all ext Psychiatric: Mood and affect normal Neck: No JVD, no carotid bruits, no thyromegaly, no lymphadenopathy. Lungs:Clear bilaterally, no wheezes, rhonci, crackles Cardiovascular: Regular rate and rhythm. No murmurs, gallops or rubs. Abdomen:Soft. Bowel sounds present. Non-tender.  Extremities: No lower extremity edema. Pulses are non-palpable + in the bilateral DP/PT.  Assessment and Plan:   1. PAD: She has severe disease in both legs with bilateral popliteal artery occlusions. Will continue to manage conservatively with Pletal 50 mg po BID. Will repeat ABI in 6 months and see her then. I have explained that stenting is not effective behind the knee joint as risk of occlusion is high. If she develops rest pain or ulcerations, will have to consider bypass.

## 2012-10-09 ENCOUNTER — Other Ambulatory Visit: Payer: Self-pay | Admitting: Nurse Practitioner

## 2012-10-12 ENCOUNTER — Other Ambulatory Visit: Payer: Self-pay | Admitting: Nurse Practitioner

## 2012-11-24 ENCOUNTER — Encounter (INDEPENDENT_AMBULATORY_CARE_PROVIDER_SITE_OTHER): Payer: Medicare Other | Admitting: Ophthalmology

## 2012-11-27 ENCOUNTER — Encounter (HOSPITAL_COMMUNITY): Payer: Self-pay | Admitting: *Deleted

## 2012-11-27 ENCOUNTER — Emergency Department (HOSPITAL_COMMUNITY): Payer: Medicare Other

## 2012-11-27 ENCOUNTER — Inpatient Hospital Stay (HOSPITAL_COMMUNITY)
Admission: EM | Admit: 2012-11-27 | Discharge: 2012-11-29 | DRG: 291 | Disposition: A | Discharge status: Home / Self Care | Payer: Medicare Other | Attending: Internal Medicine | Admitting: Internal Medicine

## 2012-11-27 DIAGNOSIS — E7800 Pure hypercholesterolemia, unspecified: Secondary | ICD-10-CM | POA: Diagnosis present

## 2012-11-27 DIAGNOSIS — H9190 Unspecified hearing loss, unspecified ear: Secondary | ICD-10-CM | POA: Diagnosis present

## 2012-11-27 DIAGNOSIS — Z79899 Other long term (current) drug therapy: Secondary | ICD-10-CM

## 2012-11-27 DIAGNOSIS — N179 Acute kidney failure, unspecified: Secondary | ICD-10-CM | POA: Diagnosis present

## 2012-11-27 DIAGNOSIS — I10 Essential (primary) hypertension: Secondary | ICD-10-CM | POA: Diagnosis present

## 2012-11-27 DIAGNOSIS — H409 Unspecified glaucoma: Secondary | ICD-10-CM | POA: Diagnosis present

## 2012-11-27 DIAGNOSIS — I739 Peripheral vascular disease, unspecified: Secondary | ICD-10-CM | POA: Diagnosis present

## 2012-11-27 DIAGNOSIS — I5032 Chronic diastolic (congestive) heart failure: Secondary | ICD-10-CM | POA: Diagnosis present

## 2012-11-27 DIAGNOSIS — R0902 Hypoxemia: Secondary | ICD-10-CM

## 2012-11-27 DIAGNOSIS — E876 Hypokalemia: Secondary | ICD-10-CM | POA: Diagnosis not present

## 2012-11-27 DIAGNOSIS — E119 Type 2 diabetes mellitus without complications: Secondary | ICD-10-CM | POA: Diagnosis present

## 2012-11-27 DIAGNOSIS — J9600 Acute respiratory failure, unspecified whether with hypoxia or hypercapnia: Secondary | ICD-10-CM | POA: Diagnosis present

## 2012-11-27 DIAGNOSIS — I509 Heart failure, unspecified: Secondary | ICD-10-CM | POA: Diagnosis present

## 2012-11-27 DIAGNOSIS — E039 Hypothyroidism, unspecified: Secondary | ICD-10-CM | POA: Diagnosis present

## 2012-11-27 DIAGNOSIS — Z96659 Presence of unspecified artificial knee joint: Secondary | ICD-10-CM

## 2012-11-27 DIAGNOSIS — I5031 Acute diastolic (congestive) heart failure: Secondary | ICD-10-CM

## 2012-11-27 LAB — CBC WITH DIFFERENTIAL/PLATELET
Basophils Absolute: 0 10*3/uL (ref 0.0–0.1)
Basophils Relative: 0 % (ref 0–1)
Eosinophils Absolute: 0.2 10*3/uL (ref 0.0–0.7)
HCT: 38.4 % (ref 36.0–46.0)
Hemoglobin: 13.2 g/dL (ref 12.0–15.0)
MCH: 30.3 pg (ref 26.0–34.0)
MCHC: 34.4 g/dL (ref 30.0–36.0)
Monocytes Absolute: 0.5 10*3/uL (ref 0.1–1.0)
Monocytes Relative: 9 % (ref 3–12)
Neutro Abs: 3.7 10*3/uL (ref 1.7–7.7)
Neutrophils Relative %: 64 % (ref 43–77)
RDW: 13.2 % (ref 11.5–15.5)

## 2012-11-27 LAB — BASIC METABOLIC PANEL
BUN: 22 mg/dL (ref 6–23)
Creatinine, Ser: 1.26 mg/dL — ABNORMAL HIGH (ref 0.50–1.10)
GFR calc Af Amer: 46 mL/min — ABNORMAL LOW (ref >90)
GFR calc non Af Amer: 40 mL/min — ABNORMAL LOW (ref >90)

## 2012-11-27 LAB — TROPONIN I: Troponin I: <0.3 ng/mL (ref <0.30)

## 2012-11-27 LAB — GLUCOSE, CAPILLARY: Glucose-Capillary: 145 mg/dL — ABNORMAL HIGH (ref 70–99)

## 2012-11-27 LAB — URINALYSIS, ROUTINE W REFLEX MICROSCOPIC
Bilirubin Urine: NEGATIVE
Hgb urine dipstick: NEGATIVE
Ketones, ur: NEGATIVE mg/dL
Protein, ur: NEGATIVE mg/dL
Urobilinogen, UA: 0.2 mg/dL (ref 0.0–1.0)

## 2012-11-27 LAB — HEMOGLOBIN A1C: Mean Plasma Glucose: 137 mg/dL — ABNORMAL HIGH (ref <117)

## 2012-11-27 LAB — D-DIMER, QUANTITATIVE: D-Dimer, Quant: 1.65 ug/mL-FEU — ABNORMAL HIGH (ref 0.00–0.48)

## 2012-11-27 MED ORDER — TECHNETIUM TC 99M DIETHYLENETRIAME-PENTAACETIC ACID: 46.0000 | Freq: Once | INTRAVENOUS | Status: DC | PRN

## 2012-11-27 MED ORDER — LATANOPROST 0.005 % OP SOLN
1.0000 [drp] | Freq: Every day | OPHTHALMIC | Status: DC
  Administered 2012-11-27 – 2012-11-28 (×2): 1 [drp] via OPHTHALMIC
  Filled 2012-11-27: qty 2.5

## 2012-11-27 MED ORDER — ENOXAPARIN SODIUM 40 MG/0.4ML ~~LOC~~ SOLN
40.0000 mg | SUBCUTANEOUS | Status: DC
  Administered 2012-11-27 – 2012-11-28 (×2): 40 mg via SUBCUTANEOUS
  Filled 2012-11-27 (×3): qty 0.4

## 2012-11-27 MED ORDER — CILOSTAZOL 50 MG PO TABS
50.0000 mg | ORAL_TABLET | Freq: Two times a day (BID) | ORAL | Status: DC
  Administered 2012-11-27 – 2012-11-29 (×4): 50 mg via ORAL
  Filled 2012-11-27 (×5): qty 1

## 2012-11-27 MED ORDER — NITROGLYCERIN IN D5W 200-5 MCG/ML-% IV SOLN
10.0000 ug/min | INTRAVENOUS | Status: DC
  Administered 2012-11-27: 10 ug/min via INTRAVENOUS
  Filled 2012-11-27: qty 250

## 2012-11-27 MED ORDER — SODIUM CHLORIDE 0.9 % IV SOLN: 250.0000 mL | INTRAVENOUS | Status: DC | PRN

## 2012-11-27 MED ORDER — ONDANSETRON HCL 4 MG/2ML IJ SOLN: 4.0000 mg | Freq: Four times a day (QID) | INTRAMUSCULAR | Status: DC | PRN

## 2012-11-27 MED ORDER — SODIUM CHLORIDE 0.9 % IJ SOLN: 3.0000 mL | INTRAMUSCULAR | Status: DC | PRN

## 2012-11-27 MED ORDER — METOPROLOL TARTRATE 1 MG/ML IV SOLN
5.0000 mg | INTRAVENOUS | Status: AC
  Administered 2012-11-27: 5 mg via INTRAVENOUS
  Filled 2012-11-27: qty 5

## 2012-11-27 MED ORDER — TECHNETIUM TO 99M ALBUMIN AGGREGATED
5.0000 | Freq: Once | INTRAVENOUS | Status: AC | PRN
  Administered 2012-11-27: 5 via INTRAVENOUS

## 2012-11-27 MED ORDER — LORATADINE 10 MG PO TABS
10.0000 mg | ORAL_TABLET | Freq: Every day | ORAL | Status: DC
Start: 2012-11-27 — End: 2012-11-29
  Administered 2012-11-27 – 2012-11-29 (×3): 10 mg via ORAL
  Filled 2012-11-27 (×3): qty 1

## 2012-11-27 MED ORDER — CITALOPRAM HYDROBROMIDE 20 MG PO TABS
20.0000 mg | ORAL_TABLET | Freq: Every day | ORAL | Status: DC
  Administered 2012-11-27 – 2012-11-29 (×3): 20 mg via ORAL
  Filled 2012-11-27 (×3): qty 1

## 2012-11-27 MED ORDER — VALSARTAN-HYDROCHLOROTHIAZIDE 320-25 MG PO TABS: 1.0000 | ORAL_TABLET | Freq: Every day | ORAL | Status: DC

## 2012-11-27 MED ORDER — CLONIDINE HCL 0.2 MG PO TABS
0.2000 mg | ORAL_TABLET | Freq: Two times a day (BID) | ORAL | Status: DC
  Administered 2012-11-27 – 2012-11-28 (×2): 0.2 mg via ORAL
  Filled 2012-11-27 (×3): qty 1

## 2012-11-27 MED ORDER — DORZOLAMIDE HCL-TIMOLOL MAL 2-0.5 % OP SOLN
1.0000 [drp] | Freq: Two times a day (BID) | OPHTHALMIC | Status: DC
  Administered 2012-11-27 – 2012-11-28 (×3): 1 [drp] via OPHTHALMIC
  Filled 2012-11-27: qty 10

## 2012-11-27 MED ORDER — FUROSEMIDE 10 MG/ML IJ SOLN
20.0000 mg | INTRAMUSCULAR | Status: AC
  Administered 2012-11-27: 20 mg via INTRAVENOUS
  Filled 2012-11-27: qty 4

## 2012-11-27 MED ORDER — ASPIRIN EC 81 MG PO TBEC
81.0000 mg | DELAYED_RELEASE_TABLET | Freq: Every day | ORAL | Status: DC
  Administered 2012-11-27 – 2012-11-29 (×3): 81 mg via ORAL
  Filled 2012-11-27 (×3): qty 1

## 2012-11-27 MED ORDER — METOPROLOL SUCCINATE ER 50 MG PO TB24
50.0000 mg | ORAL_TABLET | Freq: Every day | ORAL | Status: DC
  Administered 2012-11-28 – 2012-11-29 (×2): 50 mg via ORAL
  Filled 2012-11-27 (×2): qty 1

## 2012-11-27 MED ORDER — IRBESARTAN 300 MG PO TABS
300.0000 mg | ORAL_TABLET | Freq: Every day | ORAL | Status: DC
  Administered 2012-11-27 – 2012-11-28 (×2): 300 mg via ORAL
  Filled 2012-11-27 (×3): qty 1

## 2012-11-27 MED ORDER — ATORVASTATIN CALCIUM 40 MG PO TABS
40.0000 mg | ORAL_TABLET | Freq: Every day | ORAL | Status: DC
  Administered 2012-11-27 – 2012-11-29 (×3): 40 mg via ORAL
  Filled 2012-11-27 (×3): qty 1

## 2012-11-27 MED ORDER — FUROSEMIDE 10 MG/ML IJ SOLN
40.0000 mg | Freq: Once | INTRAMUSCULAR | Status: AC
  Administered 2012-11-27: 40 mg via INTRAVENOUS
  Filled 2012-11-27: qty 4

## 2012-11-27 MED ORDER — SODIUM CHLORIDE 0.9 % IJ SOLN
3.0000 mL | Freq: Two times a day (BID) | INTRAMUSCULAR | Status: DC
  Administered 2012-11-27 – 2012-11-28 (×2): 3 mL via INTRAVENOUS

## 2012-11-27 MED ORDER — ACETAMINOPHEN 325 MG PO TABS
650.0000 mg | ORAL_TABLET | ORAL | Status: DC | PRN
  Administered 2012-11-27 – 2012-11-29 (×2): 650 mg via ORAL
  Filled 2012-11-27 (×2): qty 2

## 2012-11-27 MED ORDER — FUROSEMIDE 10 MG/ML IJ SOLN
40.0000 mg | Freq: Two times a day (BID) | INTRAMUSCULAR | Status: DC
  Administered 2012-11-28 (×2): 40 mg via INTRAVENOUS
  Filled 2012-11-27 (×5): qty 4

## 2012-11-27 MED ORDER — SODIUM CHLORIDE 0.9 % IV SOLN
INTRAVENOUS | Status: DC
  Administered 2012-11-27: 20 mL/h via INTRAVENOUS

## 2012-11-27 MED ORDER — HYDROCHLOROTHIAZIDE 25 MG PO TABS
25.0000 mg | ORAL_TABLET | Freq: Every day | ORAL | Status: DC
  Administered 2012-11-27 – 2012-11-28 (×2): 25 mg via ORAL
  Filled 2012-11-27 (×2): qty 1

## 2012-11-27 MED ORDER — LEVOTHYROXINE SODIUM 75 MCG PO TABS
75.0000 ug | ORAL_TABLET | Freq: Every day | ORAL | Status: DC
  Administered 2012-11-27 – 2012-11-29 (×3): 75 ug via ORAL
  Filled 2012-11-27 (×4): qty 1

## 2012-11-27 NOTE — ED Notes (Signed)
Patient states she 's feeling warm tingling sensation from neck down to legs

## 2012-11-27 NOTE — ED Notes (Addendum)
Report given to [facility] ICU nurse, but nurse will pass on report to [place], rn on ICU. jen will call back in a little bit

## 2012-11-27 NOTE — Consult Note (Signed)
Reason for Consult: Dyspnea Referring Physician: [facility] Primary cardiologist: Dr. [name] PCP: Lorenza Burton  Veronica Young is an 78 y.o. female.  HPI: This pleasant elderly deaf female is seen after presenting with acute shortness of breath earlier today.  She had been in her usual state of health and shortly thereafter getting up this morning she began to note progressive difficulty with getting her breath.  She did not have any associated chest pain.  She came to the emergency room where she was noted to have markedly elevated blood pressure in the [id number] systolic range.  Chest x-ray showed evidence of pulmonary edema superimposed upon chronic lung changes.  Patient had a ventilation perfusion lung scan which showed very low probability of pulmonary embolus.  The patient has had a good response to IV Lasix given earlier today at the present time she is resting comfortably without significant dyspnea.  The patient has been careful with dietary salt.  She has been on hydrochlorothiazide in combination with an ARB chronically. The patient had a cardiac catheterization on 09/28/04 by Dr. Albertine Patricia.  She was hypertensive throughout the case.  She was found to have mild to moderate coronary artery disease with a 20% stenosis of the mid LAD after the first diagonal and a 40% stenosis of the mid LAD after the second diagonal.  There was an 30% ostial circumflex stenosis and then there was a 50% circumflex stenosis just before the takeoff of the marginal.  The right coronary artery showed only minimal irregularities.  Angiograms of the renal arteries were done showing the procedure and showed single renal arteries and no evidence of renal artery stenosis. The patient states that she has had an echocardiogram in the remote past.  No records of this are found in Epic  Past Medical History  Diagnosis Date  . Hypertension   . Diabetes mellitus     type 2  . Hypercholesterolemia   . Deafness   .  Carotid bruit   . GERD (gastroesophageal reflux disease)   . Glaucoma   . Hypothyroidism   . PAT (paroxysmal atrial tachycardia)   . PAD (peripheral artery disease)     Past Surgical History  Procedure Date  . Cholycystectomy   . Total knee arthroplasty     right  . Left breast cyst removed     Family History  Problem Relation Age of Onset  . Heart disease Mother   . Liver cancer Father   . Heart disease Sister   . Heart disease Brother   . Stroke Sister     Social History:  reports that she has never smoked. She has never used smokeless tobacco. She reports that she does not drink alcohol or use illicit drugs.  Allergies:  Allergies  Allergen Reactions  . Amlodipine   . Benicar (Olmesartan Medoxomil)     Medications: Prior to admission were reviewed.   Results for orders placed during the hospital encounter of 11/27/12 (from the past 48 hour(s))  GLUCOSE, CAPILLARY     Status: Abnormal   Collection Time   11/27/12  9:09 AM      Component Value Range Comment   Glucose-Capillary 145 (*) 70 - 99 mg/dL   CBC WITH DIFFERENTIAL     Status: Normal   Collection Time   11/27/12  9:20 AM      Component Value Range Comment   WBC 5.8  4.0 - 10.5 K/uL    RBC 4.35  3.87 - 5.11 MIL/uL  Hemoglobin 13.2  12.0 - 15.0 g/dL    HCT 38.4  36.0 - 46.0 %    MCV 88.3  78.0 - 100.0 fL    MCH 30.3  26.0 - 34.0 pg    MCHC 34.4  30.0 - 36.0 g/dL    RDW 13.2  11.5 - 15.5 %    Platelets 207  150 - 400 K/uL    Neutrophils Relative 64  43 - 77 %    Neutro Abs 3.7  1.7 - 7.7 K/uL    Lymphocytes Relative 23  12 - 46 %    Lymphs Abs 1.3  0.7 - 4.0 K/uL    Monocytes Relative 9  3 - 12 %    Monocytes Absolute 0.5  0.1 - 1.0 K/uL    Eosinophils Relative 4  0 - 5 %    Eosinophils Absolute 0.2  0.0 - 0.7 K/uL    Basophils Relative 0  0 - 1 %    Basophils Absolute 0.0  0.0 - 0.1 K/uL   BASIC METABOLIC PANEL     Status: Abnormal   Collection Time   11/27/12  9:20 AM      Component Value  Range Comment   Sodium 136  135 - 145 mEq/L    Potassium 3.7  3.5 - 5.1 mEq/L    Chloride 99  96 - 112 mEq/L    CO2 24  19 - 32 mEq/L    Glucose, Bld 88  70 - 99 mg/dL    BUN 22  6 - 23 mg/dL    Creatinine, Ser 1.26 (*) 0.50 - 1.10 mg/dL    Calcium 9.7  8.4 - 10.5 mg/dL    GFR calc non Af Amer 40 (*) >90 mL/min    GFR calc Af Amer 46 (*) >90 mL/min   TROPONIN I     Status: Normal   Collection Time   11/27/12  9:20 AM      Component Value Range Comment   Troponin I <0.30  <0.30 ng/mL   PRO B NATRIURETIC PEPTIDE     Status: Normal   Collection Time   11/27/12  9:20 AM      Component Value Range Comment   Pro B Natriuretic peptide (BNP) 403.1  0 - 450 pg/mL   D-DIMER, QUANTITATIVE     Status: Abnormal   Collection Time   11/27/12  9:20 AM      Component Value Range Comment   D-Dimer, Quant 1.65 (*) 0.00 - 0.48 ug/mL-FEU   URINALYSIS, ROUTINE W REFLEX MICROSCOPIC     Status: Abnormal   Collection Time   11/27/12 11:20 AM      Component Value Range Comment   Color, Urine YELLOW  YELLOW    APPearance CLOUDY (*) CLEAR    Specific Gravity, Urine 1.011  1.005 - 1.030    pH 7.0  5.0 - 8.0    Glucose, UA NEGATIVE  NEGATIVE mg/dL    Hgb urine dipstick NEGATIVE  NEGATIVE    Bilirubin Urine NEGATIVE  NEGATIVE    Ketones, ur NEGATIVE  NEGATIVE mg/dL    Protein, ur NEGATIVE  NEGATIVE mg/dL    Urobilinogen, UA 0.2  0.0 - 1.0 mg/dL    Nitrite NEGATIVE  NEGATIVE    Leukocytes, UA NEGATIVE  NEGATIVE MICROSCOPIC NOT DONE ON URINES WITH NEGATIVE PROTEIN, BLOOD, LEUKOCYTES, NITRITE, OR GLUCOSE <1000 mg/dL.    Nm Pulmonary Perf And Vent  11/27/2012  *RADIOLOGY REPORT*  Clinical Data:  78 year old  female with shortness of breath.  NUCLEAR MEDICINE VENTILATION - PERFUSION LUNG SCAN  Technique:  Wash-in, equilibrium, and wash-out phase ventilation images were obtained using Tc-29m-DTPA for inhalation.  Perfusion images were obtained in multiple projections after intravenous injection of Tc-3m MAA.   Radiopharmaceuticals:  46 mCi Tc-89m-DTPA gas and 5 mCi Tc-68m MAA.  Comparison:  11/27/2012 chest radiograph  Findings: Minimal patchy nonsegmental perfusion defects are identified and match ventilation defects. No ventilation - perfusion mismatches are present. No other significant abnormalities are identified.  IMPRESSION: Very low probability of pulmonary embolus (less than 10%).   Original Report Authenticated By: Margarette Canada, M.D.    Dg Chest Port 1 View  11/27/2012  *RADIOLOGY REPORT*  Clinical Data: Shortness of breath.  Hypertension.  Nonsmoker.  PORTABLE CHEST - 1 VIEW  Comparison: 07/17/2012and 06/19/2011 chestx-ray.  07/07/2011 chest CT.  Findings: CT detected right middle lobe pulmonary nodule not adequately assessed on the present plain film exam.  Please see prior CT report.  Pulmonary edema superimposed upon chronic lung changes.  No gross pneumothorax.  Cardiomegaly.  Tortuous aorta.  Levoscoliosis thoracic spine.  Nodularity left lung base possibly nipple shadow.  This can be assessed on follow-up nipple marker view.  IMPRESSION: CT detected right middle lobe pulmonary nodule not adequately assessed on the present plain film exam.  Please see prior CT report.  Pulmonary edema superimposed upon chronic lung changes.  No gross pneumothorax.  Cardiomegaly.  Nodularity left lung base possibly nipple shadow.  This can be assessed on follow-up nipple marker view.   Original Report Authenticated By: Genia Del, M.D.     Review of systems is positive for peripheral arterial occlusive disease with bilateral popliteal artery occlusions resulting in bilateral claudication after walking about 100 feet.  She has been seen by Dr. Angelena Form for this and conservative therapy is being undertaken with cilostazol. Blood pressure 134/51, pulse 47, temperature 97.6 F (36.4 C), temperature source Oral, resp. rate 20, SpO2 98.00%. The patient appears to be in no distress.  Head and neck exam reveals that the  pupils are equal and reactive.  The extraocular movements are full.  There is no scleral icterus.  Mouth and pharynx are benign.  No lymphadenopathy.  No carotid bruits.  The jugular venous pressure is normal.  Thyroid is not enlarged or tender.  The chest is clear to auscultation following diuresis earlier today.  Heart reveals no abnormal lift or heave.  First and second heart sounds are normal.  There is no murmur gallop rub or click.  The abdomen is soft and nontender.  Bowel sounds are normoactive.  There is no hepatosplenomegaly or mass.  There is an old cholecystectomy scar present.  There are no abdominal bruits.  Extremities reveal no phlebitis or edema.  Pedal pulses not palpable.  Her feet are warm.  There is no cyanosis or clubbing.  Neurologic exam is normal strength and no lateralizing weakness.  No sensory deficits.  The patient is deaf  Integument reveals no rash  EKG shows normal sinus rhythm with frequent and consecutive premature atrial beats.  No ischemic ST-T wave changes.  Reviewed by me.  Assessment/Plan: 1.  Acute congestive heart failure with flash pulmonary edema.  We will await results of echocardiogram.  I suspect that this is acute diastolic CHF.  Previously at cardiac catheterization her left ventricle was hyperdynamic. 2. mild to moderate coronary artery disease at cardiac catheterization in 2005 3. hypertensive cardiovascular disease with severe systolic hypertension on presentation today.  Known normal renal arteries in 2005 at cardiac catheter 4. mild renal insufficiency 5. stable peripheral arterial occlusive disease with bilateral popliteal artery occlusions. 6. acquired deafness  Recommendation: Agree with acute use of Lasix which has resulted in rapid clinical improvement.  She may benefit from ongoing low dose Lasix chronically but renal function will have to be watched carefully.  She has been on chronic beta blocker therapy which is appropriate for her  hypertension as well as her mild to moderate coronary artery disease and this should be continued.  Continue aspirin, statin, and cilostazol.  Will get followup troponin and followup EKG in a.m. Await results of echocardiogram. Consider subsequent outpatient Myoview stress test.   Darlin Coco 11/27/2012, 5:42 PM

## 2012-11-27 NOTE — H&P (Addendum)
[facility] History and Physical  Veronica Young [id number] DOB: Feb 28, 1933 DOA: 11/27/2012  Referring physician: Daleen Bo, ER physician PCP: Lorenza Burton, [place]  Specialists: [facility]  Chief Complaint: Shortness of breath   HPI: Veronica Young is a 78 y.o. female  past medical history of uncontrolled hypertension and deafness who in the last 24 hours started having progressively worsening shortness of breath and point where patient felt she could not breathe. She also was complaining of a vague tingling sensation premature involving from the neck down to her legs. She became concerned and came into the emergency room where she was noted to have markedly elevated blood pressures in the 180-200 range. Chest x-ray no pulmonary edema with a mild elevation in her BNP and a low probability VQ scan. Patient was given 40 mg of IV Lasix and diuresed approximately 600-700 mL. She required 50% nonrebreather mask to keep her oxygen levels up as well as 30 mcg nitroglycerin drip to keep her pressures down. Hospitals were called for further evaluation and admission.  Review of Systems:  When I saw the patient in the emergency room, she is feeling a little bit better. (Her daughter helped to communicate using sign language) she is complaining of a mild headache. She said detailing sensation had resolved. She also stated that her breathing was easier as long as she was wearing the oxygen mask. She denied any vision changes, dysphasia, chest pain, palpitations, wheeze, cough, abdominal pain, hematuria, dysuria, constipation or diarrhea. She did complain of lower extremity swelling which he says happens at times. Review of systems is otherwise negative.   Past Medical History  Diagnosis Date  . Hypertension   . Diabetes mellitus     type 2  . Hypercholesterolemia   . Deafness   . Carotid bruit   . GERD (gastroesophageal reflux disease)   . Glaucoma   . Hypothyroidism   . PAT  (paroxysmal atrial tachycardia)   . PAD (peripheral artery disease)    Past Surgical History  Procedure Date  . Cholycystectomy   . Total knee arthroplasty     right  . Left breast cyst removed    Social History:  reports that she has never smoked. She has never used smokeless tobacco. She reports that she does not drink alcohol or use illicit drugs.  patient lives at home by herself. She is normally able to participate in almost all activities of daily living without assistance. She does not use a walker  Allergies  Allergen Reactions  . Amlodipine   . Benicar (Olmesartan Medoxomil)     Family History  Problem Relation Age of Onset  . Heart disease Mother   . Liver cancer Father   . Heart disease Sister   . Heart disease Brother   . Stroke Sister     Prior to Admission medications   Medication Sig Start Date End Date Taking? Authorizing Provider  aspirin EC 81 MG tablet Take 81 mg by mouth daily.   Yes Historical Provider, MD  atorvastatin (LIPITOR) 40 MG tablet Take 40 mg by mouth daily.     Yes Historical Provider, MD  Calcium Carbonate-Vitamin D (CALCIUM 600 + D PO) Take 1 tablet by mouth daily.    Yes Historical Provider, MD  Cholecalciferol (VITAMIN D PO) Take 1 tablet by mouth daily.   Yes Historical Provider, MD  cilostazol (PLETAL) 50 MG tablet Take 1 tablet (50 mg total) by mouth 2 (two) times daily. 04/16/12 04/16/13 Yes Harrell Gave D  McAlhany, MD  citalopram (CELEXA) 20 MG tablet Take 20 mg by mouth daily.  02/08/11  Yes Historical Provider, MD  cloNIDine (CATAPRES) 0.2 MG tablet Take 0.2 mg by mouth at bedtime.   Yes Historical Provider, MD  dorzolamide-timolol (COSOPT) 22.3-6.8 MG/ML ophthalmic solution Place 1 drop into both eyes 2 (two) times daily.  12/28/10  Yes Historical Provider, MD  latanoprost (XALATAN) 0.005 % ophthalmic solution Place 1 drop into both eyes at bedtime.    Yes Historical Provider, MD  levothyroxine (SYNTHROID, LEVOTHROID) 75 MCG tablet Take 1  tablet (75 mcg total) by mouth daily. 03/23/12  Yes Burnell Blanks, MD  loratadine (CLARITIN) 10 MG tablet Take 10 mg by mouth daily.   Yes Historical Provider, MD  metoprolol (TOPROL-XL) 50 MG 24 hr tablet Take 50 mg by mouth daily.     Yes Historical Provider, MD  potassium chloride (K-DUR,KLOR-CON) 10 MEQ tablet Take 10 mEq by mouth daily.   Yes Historical Provider, MD  valsartan-hydrochlorothiazide (DIOVAN-HCT) 320-25 MG per tablet Take 1 tablet by mouth daily.   Yes Historical Provider, MD   Physical Exam: Filed Vitals:   11/27/12 1500 11/27/12 1515 11/27/12 1530 11/27/12 1615  BP: 169/70 164/62 156/66 148/49  Pulse: 80 80 77 74  Temp:      TempSrc:      Resp: 18 21 19 21   SpO2: 100%  97% 98%     General:  Alert and oriented x3, no acute distress, fatigued, looks younger than stated age  Eyes:  Sclera nonicteric, extraocular movements are intact  ENT:  Normocephalic, atraumatic. Patient is currently on nonrebreather mask  Neck:  Supple, no evidence of thyromegaly  Cardiovascular:  Borderline bradycardia, irregular rhythm with occasional skipped beats, 2/6 systolic ejection murmur  Respiratory:  clear auscultation bilaterally  Abdomen:  soft, obese, nontender, positive bowel sounds  Skin:  no skin breaks, tears or lesions. She does have some signs of chronic venous stasis in her lower extremities  Musculoskeletal:  no clubbing or cyanosis, 1+ pitting edema from the mid calf down  Psychiatric:  patient is appropriate, no evidence of psychoses  Neurologic:  no focal deficits, with the exception of cranial nerve 2  Labs on Admission:  Basic Metabolic Panel:  Lab 99991111 0920  NA 136  K 3.7  CL 99  CO2 24  GLUCOSE 88  BUN 22  CREATININE 1.26*  CALCIUM 9.7  MG --  PHOS --   CBC:  Lab 11/27/12 0920  WBC 5.8  NEUTROABS 3.7  HGB 13.2  HCT 38.4  MCV 88.3  PLT 207   Cardiac Enzymes:  Lab 11/27/12 0920  CKTOTAL --  CKMB --  CKMBINDEX --   TROPONINI <0.30    BNP (last 3 results)  Basename 11/27/12 0920  PROBNP 403.1   CBG:  Lab 11/27/12 0909  GLUCAP 145*    Radiological Exams on Admission: Nm Pulmonary Perf And Vent  11/27/2012    IMPRESSION: Very low probability of pulmonary embolus (less than 10%).   Original Report Authenticated By: Margarette Canada, M.D.    Dg Chest Port 1 View  11/27/2012  IMPRESSION: CT detected right middle lobe pulmonary nodule not adequately assessed on the present plain film exam.  Please see prior CT report.  Pulmonary edema superimposed upon chronic lung changes.  No gross pneumothorax.  Cardiomegaly.  Nodularity left lung base possibly nipple shadow.  This can be assessed on follow-up nipple marker view.   Original Report Authenticated By: Genia Del, M.D.  EKG: Independently reviewed.  multiple PACs, occasional escape rhythm  Assessment/Plan Principal Problem:  *Acute CHF (congestive heart failure): Continue IV Lasix. Have consulted [place] cardiology. Order 2-D echo-cannot find one on record. Cath report from several years ago notes hyperdynamic EF Active Problems:  Hypercholesterolemia : Continue statin  Unspecified glaucoma(365.9): Continue drops  Hypothyroidism: Continue Synthroid  PAD (peripheral artery disease): Is being followed by the [place] cardiology already  ARF (acute renal failure): Minimally elevated creatinine. Unclear if this is her baseline. We'll continue to follow.  Malignant hypertension: Work on weaning off nitro drip. Have added clonidine 0.2 by mouth twice a day continue home meds. Will also use aggressive diuresis  Respiratory failure, acute: Should improve with control of blood pressure and diuresis  Acquired deafness: Communicates through sign language Diabetes mellitus: Unclear patient truly has diabetes. Her blood sugar on admission was fine she's not on any home medications. I've ordered an A1c.   Code Status:  communicated to patient through daughter.  Clarified she does wish to be a full code.  Family Communication: plan discussed with patient and daughter at bedside  Disposition Plan:  likely will be here for several days. Place and stepped down for now given nitroglycerin drip (indicate anticipated LOS)  Time spent:  50 minutes  [facility] Pager [phone]  If 7PM-7AM, please contact night-coverage [email or web address] Password [facility] 11/27/2012, 4:54 PM

## 2012-11-27 NOTE — ED Notes (Signed)
MD at bedside. 

## 2012-11-27 NOTE — ED Provider Notes (Addendum)
History     CSN: [id number]  Arrival date & time 11/27/12  0905   First MD Initiated Contact with Patient 11/27/12 [phone]      Chief Complaint  Patient presents with  . Shortness of Breath  . arm tingling     (Consider location/radiation/quality/duration/timing/severity/associated sxs/prior treatment) HPI Comments: Veronica Young is a 78 y.o. Female who is here for shortness of breath. Shortness of breath, started this morning. There was no known provocation. She does not have chest pain, with this or dizziness. She's been taking her medications as usual. Her appetite has been somewhat decreased for the last day. She is here with family members who interpreted, using sign language. There are no modifying factors.  The history is provided by the patient.    Past Medical History  Diagnosis Date  . Hypertension   . Diabetes mellitus     type 2  . Hypercholesterolemia   . Deafness   . Carotid bruit   . GERD (gastroesophageal reflux disease)   . Glaucoma   . Hypothyroidism   . PAT (paroxysmal atrial tachycardia)   . PAD (peripheral artery disease)     Past Surgical History  Procedure Date  . Cholycystectomy   . Total knee arthroplasty     right  . Left breast cyst removed     Family History  Problem Relation Age of Onset  . Heart disease Mother   . Liver cancer Father   . Heart disease Sister   . Heart disease Brother   . Stroke Sister     History  Substance Use Topics  . Smoking status: Never Smoker   . Smokeless tobacco: Never Used  . Alcohol Use: No    OB History    Grav Para Term Preterm Abortions TAB SAB Ect Mult Living                  Review of Systems  Allergies  Amlodipine and Benicar  Home Medications   Current Outpatient Rx  Name  Route  Sig  Dispense  Refill  . ASPIRIN EC 81 MG PO TBEC   Oral   Take 81 mg by mouth daily.         . ATORVASTATIN CALCIUM 40 MG PO TABS   Oral   Take 40 mg by mouth daily.           [name] CALCIUM 600 +  D PO   Oral   Take 1 tablet by mouth daily.          [name] VITAMIN D PO   Oral   Take 1 tablet by mouth daily.         [name] CILOSTAZOL 50 MG PO TABS   Oral   Take 1 tablet (50 mg total) by mouth 2 (two) times daily.   60 tablet   11   . CITALOPRAM HYDROBROMIDE 20 MG PO TABS   Oral   Take 20 mg by mouth daily.          [name] CLONIDINE HCL 0.2 MG PO TABS   Oral   Take 0.2 mg by mouth at bedtime.         . DORZOLAMIDE HCL-TIMOLOL MAL 22.3-6.8 MG/ML OP SOLN   Both Eyes   Place 1 drop into both eyes 2 (two) times daily.          [name] LATANOPROST 0.005 % OP SOLN   Both Eyes   Place 1 drop into both eyes at bedtime.          [name]  LEVOTHYROXINE SODIUM 75 MCG PO TABS   Oral   Take 1 tablet (75 mcg total) by mouth daily.   30 tablet   6   . LORATADINE 10 MG PO TABS   Oral   Take 10 mg by mouth daily.         [name] METOPROLOL SUCCINATE ER 50 MG PO TB24   Oral   Take 50 mg by mouth daily.           [name] POTASSIUM CHLORIDE CRYS ER 10 MEQ PO TBCR   Oral   Take 10 mEq by mouth daily.         [name] VALSARTAN-HYDROCHLOROTHIAZIDE 320-25 MG PO TABS   Oral   Take 1 tablet by mouth daily.           BP 156/66  Pulse 77  Temp 97.6 F (36.4 C) (Oral)  Resp 19  SpO2 97%  Physical Exam  ED Course  Procedures (including critical care time)  Emergency department treatment: IV Lasix, IV, nitroglycerin, titrate for blood pressure to treat congestive heart failure.  Nuclear medicine study ordered to evaluate for PE, as contributor to her shortness of breath.    Date: 10/09/2012  Rate: 88  Rhythm: normal sinus rhythm  QRS Axis: normal  PR and QT Intervals: normal  ST/T Wave abnormalities: nonspecific ST changes  PR and QRS Conduction Disutrbances:none  Narrative Interpretation: PACs  Old EKG Reviewed: rate faster and PAC new   CRITICAL CARE Performed by: Daleen Bo L   Total critical care time: 50 min.  Critical care time was exclusive of separately billable  procedures and treating other patients.  Critical care was necessary to treat or prevent imminent or life-threatening deterioration.  Critical care was time spent personally by me on the following activities: development of treatment plan with patient and/or surrogate as well as nursing, discussions with consultants, evaluation of patient's response to treatment, examination of patient, obtaining history from patient or surrogate, ordering and performing treatments and interventions, ordering and review of laboratory studies, ordering and review of radiographic studies, pulse oximetry and re-evaluation of patient's condition.  Labs Reviewed  GLUCOSE, CAPILLARY - Abnormal; Notable for the following:    Glucose-Capillary 145 (*)     All other components within normal limits  BASIC METABOLIC PANEL - Abnormal; Notable for the following:    Creatinine, Ser 1.26 (*)     GFR calc non Af Amer 40 (*)     GFR calc Af Amer 46 (*)     All other components within normal limits  URINALYSIS, ROUTINE W REFLEX MICROSCOPIC - Abnormal; Notable for the following:    APPearance CLOUDY (*)     All other components within normal limits  D-DIMER, QUANTITATIVE - Abnormal; Notable for the following:    D-Dimer, Quant 1.65 (*)     All other components within normal limits  CBC WITH DIFFERENTIAL  TROPONIN I  PRO B NATRIURETIC PEPTIDE  URINE CULTURE   Nm Pulmonary Perf And Vent  11/27/2012  *RADIOLOGY REPORT*  Clinical Data:  78 year old female with shortness of breath.  NUCLEAR MEDICINE VENTILATION - PERFUSION LUNG SCAN  Technique:  Wash-in, equilibrium, and wash-out phase ventilation images were obtained using Tc-45m-DTPA for inhalation.  Perfusion images were obtained in multiple projections after intravenous injection of Tc-61m MAA.  Radiopharmaceuticals:  46 mCi Tc-58m-DTPA gas and 5 mCi Tc-41m MAA.  Comparison:  11/27/2012 chest radiograph  Findings: Minimal patchy nonsegmental perfusion defects are identified  and match ventilation defects. No  ventilation - perfusion mismatches are present. No other significant abnormalities are identified.  IMPRESSION: Very low probability of pulmonary embolus (less than 10%).   Original Report Authenticated By: Margarette Canada, M.D.    Dg Chest Port 1 View  11/27/2012  *RADIOLOGY REPORT*  Clinical Data: Shortness of breath.  Hypertension.  Nonsmoker.  PORTABLE CHEST - 1 VIEW  Comparison: 07/17/2012and 06/19/2011 chestx-ray.  07/07/2011 chest CT.  Findings: CT detected right middle lobe pulmonary nodule not adequately assessed on the present plain film exam.  Please see prior CT report.  Pulmonary edema superimposed upon chronic lung changes.  No gross pneumothorax.  Cardiomegaly.  Tortuous aorta.  Levoscoliosis thoracic spine.  Nodularity left lung base possibly nipple shadow.  This can be assessed on follow-up nipple marker view.  IMPRESSION: CT detected right middle lobe pulmonary nodule not adequately assessed on the present plain film exam.  Please see prior CT report.  Pulmonary edema superimposed upon chronic lung changes.  No gross pneumothorax.  Cardiomegaly.  Nodularity left lung base possibly nipple shadow.  This can be assessed on follow-up nipple marker view.   Original Report Authenticated By: Genia Del, M.D.      1. CHF (congestive heart failure)   2. Hypoxia   3. Hypertensive Urgency    MDM  CHF cause not clear. The patient has not had cardiac echo previously. Possibly secondary to Hypertensive Urgency. Doubt ACS, PE, pneumonia. She has mild, renal insufficiency, increased from her baseline. In March 2013. Her mental status is at baseline.  Plan: Admit        Richarda Blade, MD 11/27/12 [place], MD 11/30/12 [phone]

## 2012-11-27 NOTE — ED Notes (Signed)
hospitalist at bedside

## 2012-11-27 NOTE — ED Notes (Addendum)
Pt son reports pt started having tingling in her ams. Pt reports she cant breathe. SOB. Pt is deaf/ hard of hearing and has speech impediment. Pt bil hand grips and leg pushes. Face symmetrical. Son says pt has heart hx.

## 2012-11-27 NOTE — ED Notes (Signed)
Pt to nuclear medicine.

## 2012-11-28 DIAGNOSIS — I10 Essential (primary) hypertension: Secondary | ICD-10-CM

## 2012-11-28 DIAGNOSIS — I059 Rheumatic mitral valve disease, unspecified: Secondary | ICD-10-CM

## 2012-11-28 DIAGNOSIS — E7800 Pure hypercholesterolemia, unspecified: Secondary | ICD-10-CM

## 2012-11-28 DIAGNOSIS — I5031 Acute diastolic (congestive) heart failure: Principal | ICD-10-CM

## 2012-11-28 DIAGNOSIS — N179 Acute kidney failure, unspecified: Secondary | ICD-10-CM

## 2012-11-28 LAB — BASIC METABOLIC PANEL
CO2: 30 mEq/L (ref 19–32)
Calcium: 9.1 mg/dL (ref 8.4–10.5)
Chloride: 95 mEq/L — ABNORMAL LOW (ref 96–112)
Glucose, Bld: 123 mg/dL — ABNORMAL HIGH (ref 70–99)
Sodium: 135 mEq/L (ref 135–145)

## 2012-11-28 LAB — URINE CULTURE: Colony Count: 9000

## 2012-11-28 LAB — PRO B NATRIURETIC PEPTIDE: Pro B Natriuretic peptide (BNP): 986.9 pg/mL — ABNORMAL HIGH (ref 0–450)

## 2012-11-28 MED ORDER — LIVING BETTER WITH HEART FAILURE BOOK
Freq: Once | Status: AC
  Administered 2012-11-28: 10:00:00
  Filled 2012-11-28: qty 1

## 2012-11-28 MED ORDER — POTASSIUM CHLORIDE CRYS ER 20 MEQ PO TBCR
40.0000 meq | EXTENDED_RELEASE_TABLET | Freq: Once | ORAL | Status: AC
  Administered 2012-11-28: 40 meq via ORAL
  Filled 2012-11-28: qty 2

## 2012-11-28 MED ORDER — CLONIDINE HCL 0.1 MG PO TABS
0.1000 mg | ORAL_TABLET | Freq: Two times a day (BID) | ORAL | Status: DC
  Administered 2012-11-29: 0.1 mg via ORAL
  Filled 2012-11-28 (×3): qty 1

## 2012-11-28 NOTE — Progress Notes (Signed)
[facility] PROGRESS NOTE  Veronica Young [id number] DOB: [birth year] DOA: 11/27/2012 PCP: Lorenza Burton, FNP  Assessment/Plan: 1. Principal Problem: 2.  *Acute diastolic CHF (congestive heart failure): Echocardiogram noted grade 2 diastolic dysfunction. Now on oral Lasix. Monitor renal function. Providing education. 3. Active Problems: 4.  Hypercholesterolemia 5.  Unspecified glaucoma(365.9): Continue drops 6.  Hypothyroidism: Continue Synthroid 7.  PAD (peripheral artery disease) 8.  ARF (acute renal failure): Mild increase in creatinine, so discontinued hydrochlorothiazide 9.  Malignant hypertension: Blood pressure much better controlled. Some borderline lower blood pressures, so decrease clonidine. 10.  Respiratory failure, acute 11.  Acquired deafness 12.   Code Status: Full code Family Communication: Plan communicated to patient via daughter who is able to sign for her  Disposition Plan: Home hopefully tomorrow   Consultants:  [place] [facility]  Procedures:  Status post echocardiogram: Noting grade 2 diastolic dysfunction  Antibiotics:  None  HPI/Subjective: Patient feeling markedly better. Breathing more comfortably. No headache. No chest pain.  Objective: Filed Vitals:   11/28/12 0000 11/28/12 0330 11/28/12 0800 11/28/12 1200  BP:  117/48 116/50 96/41  Pulse:  58 57   Temp: 97.4 F (36.3 C)  97.1 F (36.2 C) 97.4 F (36.3 C)  TempSrc: Oral  Axillary Oral  Resp:  9 23 22   Height:      Weight:      SpO2:  96% 97% 96%    Intake/Output Summary (Last 24 hours) at 11/28/12 1308 Last data filed at 11/28/12 1000  Gross per 24 hour  Intake    578 ml  Output   2125 ml  Net  -1547 ml   Filed Weights   11/27/12 1833  Weight: 70.8 kg (156 lb 1.4 oz)    Exam:   General:  Alert and oriented x3, no acute distress  Cardiovascular: Regular rate and rhythm, S1-S2, borderline bradycardia  Respiratory: Clear to auscultation  bilaterally  Abdomen: Soft, nontender, nondistended, positive bowel sounds  Extremities: No clubbing or cyanosis, trace pitting edema  Data Reviewed: Basic Metabolic Panel:  Lab [id number] 0345 11/27/12 0920  NA 135 136  K 3.2* 3.7  CL 95* 99  CO2 30 24  GLUCOSE 123* 88  BUN 28* 22  CREATININE 1.45* 1.26*  CALCIUM 9.1 9.7  MG -- --  PHOS -- --   CBC:  Lab 11/27/12 0920  WBC 5.8  NEUTROABS 3.7  HGB 13.2  HCT 38.4  MCV 88.3  PLT 207   Cardiac Enzymes:  Lab 11/28/12 0345 11/27/12 0920  CKTOTAL -- --  CKMB -- --  CKMBINDEX -- --  TROPONINI <0.30 <0.30   BNP (last 3 results)  Basename 11/28/12 0345 11/27/12 0920  PROBNP 986.9* 403.1   CBG:  Lab 11/27/12 0909  GLUCAP 145*    Recent Results (from the past 240 hour(s))  MRSA PCR SCREENING     Status: Normal   Collection Time   11/27/12  6:33 PM      Component Value Range Status Comment   MRSA by PCR NEGATIVE  NEGATIVE Final      Studies: Nm Pulmonary Perf And Vent  11/27/2012   IMPRESSION: Very low probability of pulmonary embolus (less than 10%).   Original Report Authenticated By: Margarette Canada, M.D.    Dg Chest Port 1 View  11/27/2012    IMPRESSION: CT detected right middle lobe pulmonary nodule not adequately assessed on the present plain film exam.  Please see prior CT report.  Pulmonary edema superimposed upon chronic lung  changes.  No gross pneumothorax.  Cardiomegaly.  Nodularity left lung base possibly nipple shadow.  This can be assessed on follow-up nipple marker view.   Original Report Authenticated By: Genia Del, M.D.     Scheduled Meds:   . a stronger pump book   Does not apply Once  . aspirin EC  81 mg Oral Daily  . atorvastatin  40 mg Oral Daily  . cilostazol  50 mg Oral BID  . citalopram  20 mg Oral Daily  . cloNIDine  0.1 mg Oral BID  . dorzolamide-timolol  1 drop Both Eyes BID  . enoxaparin  40 mg Subcutaneous Q24H  . [COMPLETED] furosemide  20 mg Intravenous NOW  . furosemide  40  mg Intravenous BID  . irbesartan  300 mg Oral Daily  . latanoprost  1 drop Both Eyes QHS  . levothyroxine  75 mcg Oral QAC breakfast  . loratadine  10 mg Oral Daily  . [COMPLETED] metoprolol  5 mg Intravenous NOW  . metoprolol succinate  50 mg Oral Daily  . [COMPLETED] potassium chloride  40 mEq Oral Once  . potassium chloride  40 mEq Oral Once  . sodium chloride  3 mL Intravenous Q12H  . [DISCONTINUED] cloNIDine  0.2 mg Oral BID  . [DISCONTINUED] hydrochlorothiazide  25 mg Oral Daily  . [DISCONTINUED] valsartan-hydrochlorothiazide  1 tablet Oral Daily   Continuous Infusions:   . nitroGLYCERIN Stopped (11/27/12 2200)  . [DISCONTINUED] sodium chloride 20 mL/hr (11/27/12 1046)    Principal Problem:  *Acute diastolic CHF (congestive heart failure) Active Problems:  Hypercholesterolemia  Unspecified glaucoma(365.9)  Hypothyroidism  PAD (peripheral artery disease)  ARF (acute renal failure)  Malignant hypertension  Respiratory failure, acute  Acquired deafness    Time spent: 30 minutes    [facility] Pager [phone]. If 8PM-8AM, please contact night-coverage at [email or web address], password [facility] 11/28/2012, 1:08 PM  LOS: 1 day

## 2012-11-28 NOTE — Progress Notes (Signed)
Patient ID: Veronica Young, female   DOB: Jun 19, 1933, 78 y.o.   MRN: [id number]   SUBJECTIVE:  The patient is feeling well. Her breathing is much better.Creatinine has increased from 1.26 up to 1.45. At this point two-dimensional echo has not yet been done.   Filed Vitals:   11/27/12 2200 11/27/12 2220 11/28/12 0000 11/28/12 0330  BP: 101/39 101/39  117/48  Pulse: 65   58  Temp:   97.4 F (36.3 C)   TempSrc:   Oral   Resp: 18   9  Height:      Weight:      SpO2: 94%   96%    Intake/Output Summary (Last 24 hours) at 11/28/12 0756 Last data filed at 11/28/12 0730  Gross per 24 hour  Intake    314 ml  Output   1375 ml  Net  -1061 ml    LABS: Basic Metabolic Panel:  Basename 11/28/12 0345 11/27/12 0920  NA 135 136  K 3.2* 3.7  CL 95* 99  CO2 30 24  GLUCOSE 123* 88  BUN 28* 22  CREATININE 1.45* 1.26*  CALCIUM 9.1 9.7  MG -- --  PHOS -- --   Liver Function Tests: No results found for this basename: AST:2,ALT:2,ALKPHOS:2,BILITOT:2,PROT:2,ALBUMIN:2 in the last 72 hours No results found for this basename: LIPASE:2,AMYLASE:2 in the last 72 hours CBC:  Basename 11/27/12 0920  WBC 5.8  NEUTROABS 3.7  HGB 13.2  HCT 38.4  MCV 88.3  PLT 207   Cardiac Enzymes:  Basename 11/28/12 0345 11/27/12 0920  CKTOTAL -- --  CKMB -- --  CKMBINDEX -- --  TROPONINI <0.30 <0.30   BNP: No components found with this basename: POCBNP:3 D-Dimer:  Basename 11/27/12 0920  DDIMER 1.65*   Hemoglobin A1C:  Basename 11/27/12 1900  HGBA1C 6.4*   Fasting Lipid Panel: No results found for this basename: CHOL,HDL,LDLCALC,TRIG,CHOLHDL,LDLDIRECT in the last 72 hours Thyroid Function Tests: No results found for this basename: TSH,T4TOTAL,FREET3,T3FREE,THYROIDAB in the last 72 hours  RADIOLOGY: Nm Pulmonary Perf And Vent  11/27/2012  *RADIOLOGY REPORT*  Clinical Data:  78 year old female with shortness of breath.  NUCLEAR MEDICINE VENTILATION - PERFUSION LUNG SCAN  Technique:   Wash-in, equilibrium, and wash-out phase ventilation images were obtained using Tc-53m-DTPA for inhalation.  Perfusion images were obtained in multiple projections after intravenous injection of Tc-49m MAA.  Radiopharmaceuticals:  46 mCi Tc-36m-DTPA gas and 5 mCi Tc-51m MAA.  Comparison:  11/27/2012 chest radiograph  Findings: Minimal patchy nonsegmental perfusion defects are identified and match ventilation defects. No ventilation - perfusion mismatches are present. No other significant abnormalities are identified.  IMPRESSION: Very low probability of pulmonary embolus (less than 10%).   Original Report Authenticated By: Margarette Canada, M.D.    Dg Chest Port 1 View  11/27/2012  *RADIOLOGY REPORT*  Clinical Data: Shortness of breath.  Hypertension.  Nonsmoker.  PORTABLE CHEST - 1 VIEW  Comparison: 07/17/2012and 06/19/2011 chestx-ray.  07/07/2011 chest CT.  Findings: CT detected right middle lobe pulmonary nodule not adequately assessed on the present plain film exam.  Please see prior CT report.  Pulmonary edema superimposed upon chronic lung changes.  No gross pneumothorax.  Cardiomegaly.  Tortuous aorta.  Levoscoliosis thoracic spine.  Nodularity left lung base possibly nipple shadow.  This can be assessed on follow-up nipple marker view.  IMPRESSION: CT detected right middle lobe pulmonary nodule not adequately assessed on the present plain film exam.  Please see prior CT report.  Pulmonary edema superimposed upon chronic  lung changes.  No gross pneumothorax.  Cardiomegaly.  Nodularity left lung base possibly nipple shadow.  This can be assessed on follow-up nipple marker view.   Original Report Authenticated By: Genia Del, M.D.     PHYSICAL EXAM   The patient does not hear well. I communicated with her through a family member. She's feeling much better. She's not having any chest pain or shortness of breath. There is no jugulovenous distention. Lung exam reveals a few scattered rales. Cardiac exam reveals  S1 and S2. There no clicks or significant murmurs. Abdomen is soft. There is still trace peripheral edema.   TELEMETRY:  I have reviewed telemetry today November 28, 2012. There is normal sinus rhythm.   ASSESSMENT AND PLAN:   *Acute CHF (congestive heart failure)     The patient is diaries in feeling better. I do not see an order for daily Lasix. Because her creatinine is up slightly I will give her only oral Lasix this morning.   Acquired deafness    I communicated with her well this morning by talking through a family member.  Hypokalemia   Potassium is down to 3.2. I will lower her potassium this morning.  The plan will be to await her 2-D echo and to stabilize her volume status and renal function before she goes home.  Dola Argyle 11/28/2012 7:56 AM

## 2012-11-28 NOTE — Progress Notes (Signed)
  Echocardiogram 2D Echocardiogram has been performed.  Veronica Young 11/28/2012, 8:55 AM

## 2012-11-29 DIAGNOSIS — H9190 Unspecified hearing loss, unspecified ear: Secondary | ICD-10-CM

## 2012-11-29 LAB — BASIC METABOLIC PANEL
CO2: 29 mEq/L (ref 19–32)
Calcium: 9.1 mg/dL (ref 8.4–10.5)
Creatinine, Ser: 2.03 mg/dL — ABNORMAL HIGH (ref 0.50–1.10)
GFR calc Af Amer: 26 mL/min — ABNORMAL LOW (ref >90)
Sodium: 132 mEq/L — ABNORMAL LOW (ref 135–145)

## 2012-11-29 MED ORDER — SODIUM CHLORIDE 0.9 % IV SOLN
INTRAVENOUS | Status: DC
  Administered 2012-11-29: 11:00:00 via INTRAVENOUS

## 2012-11-29 MED ORDER — CLONIDINE HCL 0.1 MG PO TABS: 0.1000 mg | ORAL_TABLET | Freq: Two times a day (BID) | ORAL | Status: DC

## 2012-11-29 NOTE — Progress Notes (Signed)
Patient ID: Veronica Young, female   DOB: June 21, 1933, 78 y.o.   MRN: [id number]   SUBJECTIVE: 2-D echo reveals good systolic LV function. There is diastolic dysfunction. There has been modest diuresis. Unfortunately BUN and creatinine continued to climb. I have tried to explain this to her this morning. She is deaf and the daughter that is currently in the room is also deaf. I have shown them a handwritten note trying to explain the situation. The patient is insisting on going home. I cannot explain completely why her modest diuresis has led to creatinine elevation up to 2.0. She has been on an ARB in the past. I put her Lasix on hold immediately early this morning.   Filed Vitals:   11/29/12 0000 11/29/12 0400 11/29/12 0416 11/29/12 0500  BP:   150/55   Pulse:      Temp: 97.8 F (36.6 C) 97.5 F (36.4 C)    TempSrc: Oral Oral    Resp:      Height:      Weight: 160 lb 0.9 oz (72.6 kg)   160 lb 0.9 oz (72.6 kg)  SpO2:        Intake/Output Summary (Last 24 hours) at 11/29/12 0724 Last data filed at 11/28/12 2100  Gross per 24 hour  Intake    948 ml  Output   1775 ml  Net   -827 ml    LABS: Basic Metabolic Panel:  Basename 11/29/12 0411 11/28/12 0345  NA 132* 135  K 3.5 3.2*  CL 92* 95*  CO2 29 30  GLUCOSE 136* 123*  BUN 41* 28*  CREATININE 2.03* 1.45*  CALCIUM 9.1 9.1  MG -- --  PHOS -- --   Liver Function Tests: No results found for this basename: AST:2,ALT:2,ALKPHOS:2,BILITOT:2,PROT:2,ALBUMIN:2 in the last 72 hours No results found for this basename: LIPASE:2,AMYLASE:2 in the last 72 hours CBC:  Basename 11/27/12 0920  WBC 5.8  NEUTROABS 3.7  HGB 13.2  HCT 38.4  MCV 88.3  PLT 207   Cardiac Enzymes:  Basename 11/28/12 0345 11/27/12 0920  CKTOTAL -- --  CKMB -- --  CKMBINDEX -- --  TROPONINI <0.30 <0.30   BNP: No components found with this basename: POCBNP:3 D-Dimer:  Basename 11/27/12 0920  DDIMER 1.65*   Hemoglobin A1C:  Basename 11/27/12 1900    HGBA1C 6.4*   Fasting Lipid Panel: No results found for this basename: CHOL,HDL,LDLCALC,TRIG,CHOLHDL,LDLDIRECT in the last 72 hours Thyroid Function Tests: No results found for this basename: TSH,T4TOTAL,FREET3,T3FREE,THYROIDAB in the last 72 hours  RADIOLOGY: Nm Pulmonary Perf And Vent  11/27/2012  *RADIOLOGY REPORT*  Clinical Data:  78 year old female with shortness of breath.  NUCLEAR MEDICINE VENTILATION - PERFUSION LUNG SCAN  Technique:  Wash-in, equilibrium, and wash-out phase ventilation images were obtained using Tc-47m-DTPA for inhalation.  Perfusion images were obtained in multiple projections after intravenous injection of Tc-73m MAA.  Radiopharmaceuticals:  46 mCi Tc-24m-DTPA gas and 5 mCi Tc-46m MAA.  Comparison:  11/27/2012 chest radiograph  Findings: Minimal patchy nonsegmental perfusion defects are identified and match ventilation defects. No ventilation - perfusion mismatches are present. No other significant abnormalities are identified.  IMPRESSION: Very low probability of pulmonary embolus (less than 10%).   Original Report Authenticated By: Margarette Canada, M.D.    Dg Chest Port 1 View  11/27/2012  *RADIOLOGY REPORT*  Clinical Data: Shortness of breath.  Hypertension.  Nonsmoker.  PORTABLE CHEST - 1 VIEW  Comparison: 07/17/2012and 06/19/2011 chestx-ray.  07/07/2011 chest CT.  Findings: CT detected  right middle lobe pulmonary nodule not adequately assessed on the present plain film exam.  Please see prior CT report.  Pulmonary edema superimposed upon chronic lung changes.  No gross pneumothorax.  Cardiomegaly.  Tortuous aorta.  Levoscoliosis thoracic spine.  Nodularity left lung base possibly nipple shadow.  This can be assessed on follow-up nipple marker view.  IMPRESSION: CT detected right middle lobe pulmonary nodule not adequately assessed on the present plain film exam.  Please see prior CT report.  Pulmonary edema superimposed upon chronic lung changes.  No gross pneumothorax.   Cardiomegaly.  Nodularity left lung base possibly nipple shadow.  This can be assessed on follow-up nipple marker view.   Original Report Authenticated By: Genia Del, M.D.     PHYSICAL EXAM  The patient is oriented to person time and place. As mentioned she is deaf. There is no jugular venous distention. Lungs reveal a few scattered rhonchi and question of a few rales. The abdomen is soft. Her edema has improved.    ASSESSMENT AND PLAN:   *Acute diastolic CHF (congestive heart failure)     Clinically the patient is improved. She feels much better. As outlined in my notes above, her renal function is worse. She is demanding to go home. To help resolve the situation I would allow her to go home and make plans for her to see either Dr. Peter [name] or Truitt Merle ( who knows her) in our office soon. She should be seen Tuesday or Wednesday of this week. I would hold the valsartan hydrochlorothiazide that she takes at home today. I would restart this tomorrow. I will ask my team to be in touch with our office to communicate with the patient to make a plan for her outpatient followup..   ARF (acute renal failure)   I am very concerned about the fact that her creatinine is up to 2.0. Unfortunately we have no choice is she is demanding to go home. I have outlined a plan in the sentence above.   Dola Argyle 11/29/2012 7:24 AM

## 2012-11-29 NOTE — Discharge Summary (Signed)
Physician Discharge Summary  ALENAH LAPLUME [id number] DOB: 06-06-1933 DOA: 11/27/2012  PCP: Lorenza Burton, FNP  Consultants: Dr. Ron Parker, [place] [facility]  Admit date: 11/27/2012 Discharge date: 11/29/2012  Time spent: 35 minutes  Recommendations for Outpatient Follow-up:  Patient is being discharged home under the care of her daughters. [place] cardiology will call her tomorrow to schedule outpatient appointment with her for Tuesday or Wednesday.. Given mild elevation in creatinine secondary to overdiuresis, ARB/HCTZ being held for now.  Discharge Diagnoses:  Principal Problem:  *Acute diastolic CHF (congestive heart failure) Active Problems:  Hypercholesterolemia  Unspecified glaucoma(365.9)  Hypothyroidism  PAD (peripheral artery disease)  ARF (acute renal failure)  Malignant hypertension  Respiratory failure, acute  Acquired deafness  diabetes mellitus type 2 well controlled  Discharge Condition: Improved, being discharged home  Diet recommendation: Carb modified heart healthy  Filed Weights   11/27/12 1833 11/29/12 0000 11/29/12 0500  Weight: 70.8 kg (156 lb 1.4 oz) 72.6 kg (160 lb 0.9 oz) 72.6 kg (160 lb 0.9 oz)    History of present illness:  78 year old white female with past history uncontrolled hypertension, mild diabetes mellitus and deafness who presented on 12/624 hours of progressively worsening shortness of breath and upon arrival to the emergency room was noted to have elevated blood pressures in the [id number] systolic range. She doesn't have a mild elevation in her BNP (with no previous history of congestive heart failure) and had significant diuresis with Lasix. Patient was started on a nitroglycerin drip in the emergency room to bring down her blood pressures and she was admitted by the hospitalists to the step down unit for further treatment.  Hospital Course:  Principal Problem:  *Acute diastolic CHF (congestive heart failure): Patient was started on IV  Lasix and diuresed over 4 L including that not documented in the emergency room. By hospital day 2, she is able to be weaned off all oxygen support. An echocardiogram was checked noting grade 2 diastolic dysfunction. Patient's creatinine started to mildly elevated on hospital day 2 in the hospital day 3 was up to 2.0. See below. She was given information about congestive heart failure management and will follow up with cardiology in 2 days.  Active Problems:  Hypercholesterolemia : Stable medical issue. Patient was continued on her statin medication.   Unspecified glaucoma(365.9): Stable medical issue. Patient was continued on her drops.   Hypothyroidism: Stable medical issue. Patient was continued on Synthroid.   PAD (peripheral artery disease): Stable medical issue patient was continued on Pletal. .    ARF (acute renal failure): Patient's creatinine on admission was 1.26, I suspect this may be close to her baseline. By hospital day 2 was up to 1.45 with a mild elevation of BUN. By day 3, the patient's creatinine was 2.03 with a BUN of 41. At this point her diuresis had been stopped and she was felt to be for slightly over diuresed. After discussion with cardiology and the fact that this was adamant about going home on 12/8, the plan to hold her ARB/HCTZ and cardiology to see her in 2 days to recheck her blood work then. Prior to her discharge she did get some mild IV fluids for approximately 2-3 hours at 50 cc an hour.    Malignant hypertension: Initially patient required aggressive blood pressure control. She was continued on her beta blocker, ACE inhibitor, clonidine which was changed from 0.2 mg each bedtime to 0.2 twice a day plus IV when necessary hydralazine and IV Lasix. Patient diuresed very  well and started having episodes of hypotension and borderline bradycardia. Some of her medications were scaled back suspected likely some of her resistant hypertension with secondary to volume overload.  The plan is for her to go home on all of her home medications with adjustment of her clonidine 0.2 each bedtime changed to 0.1 by mouth twice a day. As mentioned above, her ARB/HCTZ be on hold for 2 days giving time for her renal function to improve.    Respiratory failure, acute: Secondary to volume overload. Resolved by hospital day 2 and patient was able to breathe comfortably on room air.  Acquired deafness: Progressively worsening condition. Patient currently signs of leakage was done through her daughters who also sign.  Diabetes mellitus2: Well-controlled. Patient is on any oral medications. A1c was at 6.4. Sugars monitored in the hospital were stable.  Procedures:  2-D echocardiogram done 12/7. She does have an ejection fraction of 60% but having grade 2 diastolic dysfunction, mild mitral regurg, left atrial enlargement and mild right atrial dilatation.   Consultations:  None   Discharge Exam: Filed Vitals:   11/29/12 0400 11/29/12 0416 11/29/12 0500 11/29/12 0800  BP:  150/55  159/79  Pulse:      Temp: 97.5 F (36.4 C)   97.4 F (36.3 C)  TempSrc: Oral   Oral  Resp:    14  Height:      Weight:   72.6 kg (160 lb 0.9 oz)   SpO2:        General:Alert and oriented x3, no acute distress, fatigued, looks younger than stated 78 age  Cardiovascular: Regular rate and rhythm, Q000111Q, 2/6 systolic ejection murmur  Respiratory: Clear auscultation bilaterally Abdomen: Soft, nontender, nondistended, positive bowel sounds Extremities: No clubbing or cyanosis or edema   Discharge Instructions  Discharge Orders    Future Appointments: Provider: Department: Dept Phone: Center:   12/25/2012 9:15 AM Hayden Pedro, MD [facility] [phone] None     Future Orders Please Complete By Expires   Diet - low sodium heart healthy      Increase activity slowly          Medication List     As of 11/29/2012 12:31 PM    STOP taking these medications          valsartan-hydrochlorothiazide 320-25 MG per tablet   Commonly known as: DIOVAN-HCT      TAKE these medications         aspirin EC 81 MG tablet   Take 81 mg by mouth daily.      atorvastatin 40 MG tablet   Commonly known as: LIPITOR   Take 40 mg by mouth daily.      CALCIUM 600 + D PO   Take 1 tablet by mouth daily.      cilostazol 50 MG tablet   Commonly known as: PLETAL   Take 1 tablet (50 mg total) by mouth 2 (two) times daily.      citalopram 20 MG tablet   Commonly known as: CELEXA   Take 20 mg by mouth daily.      cloNIDine 0.1 MG tablet   Commonly known as: CATAPRES   Take 1 tablet (0.1 mg total) by mouth 2 (two) times daily.      dorzolamide-timolol 22.3-6.8 MG/ML ophthalmic solution   Commonly known as: COSOPT   Place 1 drop into both eyes 2 (two) times daily.      latanoprost 0.005 % ophthalmic solution  Commonly known as: XALATAN   Place 1 drop into both eyes at bedtime.      levothyroxine 75 MCG tablet   Commonly known as: SYNTHROID, LEVOTHROID   Take 1 tablet (75 mcg total) by mouth daily.      loratadine 10 MG tablet   Commonly known as: CLARITIN   Take 10 mg by mouth daily.      metoprolol succinate 50 MG 24 hr tablet   Commonly known as: TOPROL-XL   Take 50 mg by mouth daily.      potassium chloride 10 MEQ tablet   Commonly known as: K-DUR,KLOR-CON   Take 10 mEq by mouth daily.      VITAMIN D PO   Take 1 tablet by mouth daily.           Follow-up Information    Follow up with Lorenza Burton, [place]. Schedule an appointment as soon as possible for a visit in 1 month.   Contact information:   [place] [address] [phone]       Follow up with [facility]). (They will call you tomorrow to make appt to see them on Tues or Wed)    Contact information:   [address] [place] [phone]          The results of significant diagnostics from  this hospitalization (including imaging, microbiology, ancillary and laboratory) are listed below for reference.    Significant Diagnostic Studies: Nm Pulmonary Perf And Vent  11/27/2012  IMPRESSION: Very low probability of pulmonary embolus (less than 10%).   Original Report Authenticated By: Margarette Canada, M.D.    Dg Chest Port 1 View  11/27/2012   IMPRESSION: CT detected right middle lobe pulmonary nodule not adequately assessed on the present plain film exam.  Please see prior CT report.  Pulmonary edema superimposed upon chronic lung changes.  No gross pneumothorax.  Cardiomegaly.  Nodularity left lung base possibly nipple shadow.  This can be assessed on follow-up nipple marker view.   Original Report Authenticated By: Genia Del, M.D.     Microbiology: Recent Results (from the past 240 hour(s))  URINE CULTURE     Status: Normal   Collection Time   11/27/12 11:20 AM      Component Value Range Status Comment   Specimen Description URINE, CLEAN CATCH   Final    Special Requests NONE   Final    Culture  Setup Time 11/27/2012 19:31   Final    Colony Count 9,000 COLONIES/ML   Final    Culture INSIGNIFICANT GROWTH   Final    Report Status 11/28/2012 FINAL   Final   MRSA PCR SCREENING     Status: Normal   Collection Time   11/27/12  6:33 PM      Component Value Range Status Comment   MRSA by PCR NEGATIVE  NEGATIVE Final      Labs: Basic Metabolic Panel:  Lab Q000111Q 0411 11/28/12 0345 11/27/12 0920  NA 132* 135 136  K 3.5 3.2* 3.7  CL 92* 95* 99  CO2 29 30 24   GLUCOSE 136* 123* 88  BUN 41* 28* 22  CREATININE 2.03* 1.45* 1.26*  CALCIUM 9.1 9.1 9.7  MG -- -- --  PHOS -- -- --   CBC:  Lab 11/27/12 0920  WBC 5.8  NEUTROABS 3.7  HGB 13.2  HCT 38.4  MCV 88.3  PLT 207   Cardiac Enzymes:  Lab 11/28/12 0345 11/27/12 0920  CKTOTAL -- --  CKMB -- --  CKMBINDEX -- --  TROPONINI <0.30 <0.30   BNP: BNP (last 3 results)  Basename 11/28/12 0345 11/27/12 0920  PROBNP  986.9* 403.1   CBG:  Lab 11/27/12 0909  GLUCAP 145*       Signed:  Jaleesa Cervi K  [facility] 11/29/2012, 12:31 PM

## 2012-11-29 NOTE — Progress Notes (Signed)
Left message on scheduler's voicemail to call pt for appt early this week. Beyza Bellino PA-C

## 2012-12-03 ENCOUNTER — Encounter: Payer: Self-pay | Admitting: Nurse Practitioner

## 2012-12-03 ENCOUNTER — Ambulatory Visit (INDEPENDENT_AMBULATORY_CARE_PROVIDER_SITE_OTHER): Payer: Medicare Other | Admitting: Nurse Practitioner

## 2012-12-03 VITALS — BP 160/74 | HR 60 | Ht 66.0 in | Wt 160.4 lb

## 2012-12-03 DIAGNOSIS — I5032 Chronic diastolic (congestive) heart failure: Secondary | ICD-10-CM

## 2012-12-03 LAB — BASIC METABOLIC PANEL
BUN: 31 mg/dL — ABNORMAL HIGH (ref 6–23)
CO2: 22 mEq/L (ref 19–32)
Calcium: 9.1 mg/dL (ref 8.4–10.5)
Chloride: 100 mEq/L (ref 96–112)
Creatinine, Ser: 1.3 mg/dL — ABNORMAL HIGH (ref 0.4–1.2)
GFR: 43.76 mL/min — ABNORMAL LOW (ref >60.00)
Glucose, Bld: 109 mg/dL — ABNORMAL HIGH (ref 70–99)
Potassium: 4 mEq/L (ref 3.5–5.1)
Sodium: 131 mEq/L — ABNORMAL LOW (ref 135–145)

## 2012-12-03 MED ORDER — FUROSEMIDE 40 MG PO TABS: 40.0000 mg | ORAL_TABLET | Freq: Every day | ORAL | Status: DC

## 2012-12-03 NOTE — Progress Notes (Signed)
Veronica Young Date of Birth: [birth year] Medical Record [id number]  History of Present Illness: Veronica Young is seen today for a post hospital visit. Veronica Young is seen for Dr. [name]. Veronica Young has HLD, glaucoma, hypothyroidism, PAD managed medically, HTN, DM and is deaf. Veronica Young was most recently admitted with malignant HTN. Medicines were increased. Veronica Young had associated diastolic heart failure and was diuresed. Did have a bump in her renal function.   Veronica Young comes in today. Veronica Young has her daughter present who also interprets. Veronica Young notes that Veronica Young is feeling better. Not short of breath. Less swelling. No chest pain. BP still about 0000000 systolic at home. Veronica Young is doing much better about avoiding salt. This was probably the trigger for this recent event. Still with claudication but no rest pain. No ulcers. Veronica Young remains off of her Diovan HCT due to her Young function being elevated from over diuresis. Veronica Young was previously on 320/25.  Current Outpatient Prescriptions on File Prior to Visit  Medication Sig Dispense Refill  . aspirin EC 81 MG tablet Take 81 mg by mouth daily.      [name] atorvastatin (LIPITOR) 40 MG tablet Take 40 mg by mouth 2 (two) times daily.       . Calcium Carbonate-Vitamin D (CALCIUM 600 + D PO) Take 1 tablet by mouth daily.       . Cholecalciferol (VITAMIN D PO) Take 1 tablet by mouth daily.      . cilostazol (PLETAL) 50 MG tablet Take 1 tablet (50 mg total) by mouth 2 (two) times daily.  60 tablet  11  . citalopram (CELEXA) 20 MG tablet Take 20 mg by mouth daily.       . cloNIDine (CATAPRES) 0.1 MG tablet Take 1 tablet (0.1 mg total) by mouth 2 (two) times daily.  60 tablet  0  . dorzolamide-timolol (COSOPT) 22.3-6.8 MG/ML ophthalmic solution Place 1 drop into both eyes 2 (two) times daily.       [name] latanoprost (XALATAN) 0.005 % ophthalmic solution Place 1 drop into both eyes at bedtime.       . metoprolol (TOPROL-XL) 50 MG 24 hr tablet Take 50 mg by mouth daily.        . potassium chloride (K-DUR,KLOR-CON)  10 MEQ tablet Take 10 mEq by mouth daily.      . [DISCONTINUED] levothyroxine (SYNTHROID, LEVOTHROID) 75 MCG tablet Take 1 tablet (75 mcg total) by mouth daily.  30 tablet  6  . furosemide (LASIX) 40 MG tablet Take 1 tablet (40 mg total) by mouth daily.  30 tablet  6    Allergies  Allergen Reactions  . Amlodipine   . Benicar (Olmesartan Medoxomil)     Past Medical History  Diagnosis Date  . Hypertension   . Diabetes mellitus     type 2  . Hypercholesterolemia   . Deafness   . Carotid bruit   . GERD (gastroesophageal reflux disease)   . Glaucoma   . Hypothyroidism   . PAT (paroxysmal atrial tachycardia)   . PAD (peripheral artery disease)   . Diastolic heart failure     EF is 60%    Past Surgical History  Procedure Date  . Cholycystectomy   . Total knee arthroplasty     right  . Left breast cyst removed     History  Smoking status  . Never Smoker   Smokeless tobacco  . Never Used    History  Alcohol Use No    Family History  Problem Relation  Age of Onset  . Heart disease Mother   . Liver cancer Father   . Heart disease Sister   . Heart disease Brother   . Stroke Sister     Review of Systems: The review of systems is per the HPI.  All other systems were reviewed and are negative.  Physical Exam: BP 160/74  Pulse 60  Ht 5\' 6"  (1.676 m)  Wt 160 lb 6.4 oz (72.757 kg)  BMI 25.89 kg/m2 Patient is very pleasant and in no acute distress. Veronica Young is deaf and communicates in sign language. Skin is warm and dry. Color is normal.  HEENT is unremarkable. Normocephalic/atraumatic. PERRL. Sclera are nonicteric. Neck is supple. No masses. No JVD. Lungs are clear. Cardiac exam shows a regular rate and rhythm. Abdomen is soft. Extremities are fleshy but without significant edema. Gait and ROM are intact. No gross neurologic deficits noted.   LABORATORY DATA:  BMET is pending for today.   Lab Results  Component Value Date   WBC 5.8 11/27/2012   HGB 13.2 11/27/2012    HCT 38.4 11/27/2012   PLT 207 11/27/2012   GLUCOSE 136* 11/29/2012   ALT 243* 07/05/2011   AST 230* 07/05/2011   NA 132* 11/29/2012   K 3.5 11/29/2012   CL 92* 11/29/2012   CREATININE 2.03* 11/29/2012   BUN 41* 11/29/2012   CO2 29 11/29/2012   TSH 1.389 07/05/2011   INR 1.91* 07/11/2011   HGBA1C 6.4* 11/27/2012   Echo Study Conclusions from December 2013  - Left ventricle: The cavity size was normal. Wall thickness was increased in a pattern of mild LVH. The estimated ejection fraction was 60%. Wall motion was normal; there were no regional wall motion abnormalities. Features are consistent with a pseudonormal left ventricular filling pattern, with concomitant abnormal relaxation and increased filling pressure (grade 2 diastolic dysfunction). Doppler parameters are consistent with high ventricular filling pressure. - Mitral valve: Mild regurgitation. - Left atrium: The atrium was mildly dilated. - Right ventricle: The cavity size was normal. Systolic function was normal. - Right atrium: The atrium was mildly dilated. - Pulmonary arteries: PA peak pressure: 70mm Hg (S).  Assessment / Plan: 1. HTN - blood pressure not at goal. Need to check a BMET today. If ok, will restart her Diovan/HCT. If not, will start Hydralazine. I will see her back in one week.  2. Diastolic heart failure - Veronica Young understands the need now for salt restriction. Veronica Young does not have any Lasix on hand. I have sent Lasix 40 mg to use just PRN to the drug store. Veronica Young is reminded to weigh every day.   3. PVD - unchanged. Managed medically.

## 2012-12-03 NOTE — Patient Instructions (Addendum)
We will check labs today  Based on your labs, we will either restart your Diovan or change to a whole different medicine for your blood pressure  Avoid salt  Weigh each day. If your weight goes up 2 to 3 pounds overnight, take the fluid pill (lasix 40 mg). I have sent this to the drugstore.   I want to see you in a week.  Monitor your blood pressure at home and keep a record to bring to me  Call the [place] office at [phone] if you have any questions, problems or concerns.

## 2012-12-04 ENCOUNTER — Other Ambulatory Visit: Payer: Self-pay | Admitting: *Deleted

## 2012-12-04 MED ORDER — HYDRALAZINE HCL 25 MG PO TABS: 25.0000 mg | ORAL_TABLET | Freq: Three times a day (TID) | ORAL | Status: DC

## 2012-12-11 ENCOUNTER — Ambulatory Visit (INDEPENDENT_AMBULATORY_CARE_PROVIDER_SITE_OTHER): Payer: Medicare Other | Admitting: Nurse Practitioner

## 2012-12-11 ENCOUNTER — Encounter: Payer: Self-pay | Admitting: Nurse Practitioner

## 2012-12-11 VITALS — BP 128/64 | HR 60 | Ht 66.0 in | Wt 161.8 lb

## 2012-12-11 DIAGNOSIS — I10 Essential (primary) hypertension: Secondary | ICD-10-CM

## 2012-12-11 NOTE — Patient Instructions (Addendum)
I think you are doing well.  Your blood pressure looks much better  Continue to monitor at home  I will see you in a month and we will check lab when you come back. You do not need to fast  Keep weighing daily and avoid salt  Call the [place] office at [phone] if you have any questions, problems or concerns.

## 2012-12-11 NOTE — Progress Notes (Signed)
Veronica Young Date of Birth: Jan 03, 1933 Medical Record [id number]  History of Present Illness: Veronica Young is seen today for a follow up visit. She is seen for Dr. [name]. It is an 8 day check. She has HLD, glaucoma, hypothyroidism, PAD managed medically, HTN, DM and is deaf. She was most recently admitted with malignant HTN. Medicines were increased. She had associated diastolic heart failure and was diuresed. Did have a bump in her renal function.   I saw her about 8 days ago. We added Hydralazine for her blood pressure. Renal function had improved but was not back to normal so I avoided restarting her ACE.   She comes in today. She is here with her daughter  She feels good. No chest pain. Not short of breath. Weight is stable. BP is coming down nicely. Her diary from home is reviewed and looks good. Avoiding salt. Tolerating her medicines. Not dizzy or lightheaded.    Current Outpatient Prescriptions on File Prior to Visit  Medication Sig Dispense Refill  . aspirin EC 81 MG tablet Take 81 mg by mouth daily.      [name] atorvastatin (LIPITOR) 40 MG tablet Take 40 mg by mouth 2 (two) times daily.       . Calcium Carbonate-Vitamin D (CALCIUM 600 + D PO) Take 1 tablet by mouth daily.       . Cholecalciferol (VITAMIN D PO) Take 1 tablet by mouth daily.      . cilostazol (PLETAL) 50 MG tablet Take 1 tablet (50 mg total) by mouth 2 (two) times daily.  60 tablet  11  . citalopram (CELEXA) 20 MG tablet Take 20 mg by mouth daily.       . cloNIDine (CATAPRES) 0.1 MG tablet Take 1 tablet (0.1 mg total) by mouth 2 (two) times daily.  60 tablet  0  . dorzolamide-timolol (COSOPT) 22.3-6.8 MG/ML ophthalmic solution Place 1 drop into both eyes 2 (two) times daily.       . furosemide (LASIX) 40 MG tablet Take 1 tablet (40 mg total) by mouth daily.  30 tablet  6  . hydrALAZINE (APRESOLINE) 25 MG tablet Take 1 tablet (25 mg total) by mouth 3 (three) times daily.  90 tablet  3  . latanoprost (XALATAN) 0.005 %  ophthalmic solution Place 1 drop into both eyes at bedtime.       [name] levothyroxine (SYNTHROID, LEVOTHROID) 75 MCG tablet Take 150 mcg by mouth daily.      . metoprolol (TOPROL-XL) 50 MG 24 hr tablet Take 50 mg by mouth daily.        . potassium chloride (K-DUR,KLOR-CON) 10 MEQ tablet Take 10 mEq by mouth daily.        Allergies  Allergen Reactions  . Amlodipine   . Benicar (Olmesartan Medoxomil)     Past Medical History  Diagnosis Date  . Hypertension   . Diabetes mellitus     type 2  . Hypercholesterolemia   . Deafness   . Carotid bruit   . GERD (gastroesophageal reflux disease)   . Glaucoma   . Hypothyroidism   . PAT (paroxysmal atrial tachycardia)   . PAD (peripheral artery disease)   . Diastolic heart failure     EF is 60%    Past Surgical History  Procedure Date  . Cholycystectomy   . Total knee arthroplasty     right  . Left breast cyst removed     History  Smoking status  . Never Smoker  Smokeless tobacco  . Never Used    History  Alcohol Use No    Family History  Problem Relation Age of Onset  . Heart disease Mother   . Liver cancer Father   . Heart disease Sister   . Heart disease Brother   . Stroke Sister     Review of Systems: The review of systems is per the HPI.  All other systems were reviewed and are negative.  Physical Exam: BP 128/64  Pulse 60  Ht 5\' 6"  (1.676 m)  Wt 161 lb 12.8 oz (73.392 kg)  BMI 26.12 kg/m2 Patient is very pleasant and in no acute distress. She is deaf. Skin is warm and dry. Color is normal.  HEENT is unremarkable. Normocephalic/atraumatic. PERRL. Sclera are nonicteric. Neck is supple. No masses. No JVD. Lungs are clear. Cardiac exam shows a regular rate and rhythm. Abdomen is soft. Extremities are without edema. Gait and ROM are intact. No gross neurologic deficits noted.   LABORATORY DATA:  Lab Results  Component Value Date   WBC 5.8 11/27/2012   HGB 13.2 11/27/2012   HCT 38.4 11/27/2012   PLT 207  11/27/2012   GLUCOSE 109* 12/03/2012   ALT 243* 07/05/2011   AST 230* 07/05/2011   NA 131* 12/03/2012   K 4.0 12/03/2012   CL 100 12/03/2012   CREATININE 1.3* 12/03/2012   BUN 31* 12/03/2012   CO2 22 12/03/2012   TSH 1.389 07/05/2011   INR 1.91* 07/11/2011   HGBA1C 6.4* 11/27/2012     Assessment / Plan:  1. HTN - blood pressure has improved nicely with the addition of Hydralazine. She will continue to monitor at home.  2. Diastolic heart failure - she looks compensated. No changes in her medicines. Will see her back in about a month  3. CKD - recheck BMET on return visit. She remains off of her ACE at this time.  4. PVD - managed medically.  She does look better. No change in her medicines. Recheck labs on return visit.   Patient is agreeable to this plan and will call if any problems develop in the interim.

## 2012-12-23 DIAGNOSIS — IMO0001 Reserved for inherently not codable concepts without codable children: Secondary | ICD-10-CM

## 2012-12-23 HISTORY — DX: Reserved for inherently not codable concepts without codable children: IMO0001

## 2012-12-25 ENCOUNTER — Encounter (INDEPENDENT_AMBULATORY_CARE_PROVIDER_SITE_OTHER): Payer: Medicare Other | Admitting: Ophthalmology

## 2013-01-08 ENCOUNTER — Encounter: Payer: Self-pay | Admitting: Nurse Practitioner

## 2013-01-08 ENCOUNTER — Ambulatory Visit (INDEPENDENT_AMBULATORY_CARE_PROVIDER_SITE_OTHER): Payer: Medicare Other | Admitting: Nurse Practitioner

## 2013-01-08 VITALS — BP 145/60 | HR 60 | Ht 66.0 in | Wt 157.8 lb

## 2013-01-08 DIAGNOSIS — I10 Essential (primary) hypertension: Secondary | ICD-10-CM

## 2013-01-08 DIAGNOSIS — I5032 Chronic diastolic (congestive) heart failure: Secondary | ICD-10-CM

## 2013-01-08 LAB — BASIC METABOLIC PANEL
BUN: 13 mg/dL (ref 6–23)
CO2: 29 mEq/L (ref 19–32)
Calcium: 8.9 mg/dL (ref 8.4–10.5)
Chloride: 100 mEq/L (ref 96–112)
Creatinine, Ser: 1 mg/dL (ref 0.4–1.2)
GFR: 59.16 mL/min — ABNORMAL LOW (ref >60.00)
Glucose, Bld: 87 mg/dL (ref 70–99)
Potassium: 4.4 mEq/L (ref 3.5–5.1)
Sodium: 134 mEq/L — ABNORMAL LOW (ref 135–145)

## 2013-01-08 MED ORDER — NITROGLYCERIN 0.4 MG SL SUBL: 0.4000 mg | SUBLINGUAL_TABLET | SUBLINGUAL | Status: DC | PRN

## 2013-01-08 NOTE — Patient Instructions (Addendum)
Stay on your current medicines  I have sent in a prescription for NTG to use for this throat pain. Use your NTG under your tongue for recurrent chest pain. May take one tablet every 5 minutes. If you are still having discomfort after 3 tablets in 15 minutes, call 911.  We are going to arrange for a stress test next week.  We are checking lab today (BMET)  Continue to monitor your blood pressure at home  We are going to see you back in 2 weeks  Continue to weigh daily and avoid salt  Call the [place] office at [phone] if you have any questions, problems or concerns.

## 2013-01-08 NOTE — Progress Notes (Signed)
Stanton Kidney Date of Birth: [birth year] Medical Record [id number]  History of Present Illness: Ms. Veronica Young is seen back today for a one month check. She is seen for Dr. [name]. She has HLD, glaucoma, hypothyroidism, PAD managed medically, HTN, Dm and is deaf. She was most recently admitted with malignant HTN. Medices were increased. She had associated diastolic heart failure and was diuresed. Renal function had not improved and we added hydralazine instead of ACE for her blood pressure. Last stress test was in 2010.  I saw here a month ago. She was doing well.  She comes in today. She is here with her interpreter. She says she is doing well. Not short of breath. Not lightheaded or dizzy. Weight is stable. Down 4 pounds today. Has cut her hydralazine back to just BID because the night dose made her "sick feeling". Not using any lasix. She has been increasing her activity and notes that she has this throat burning with walking that is relieved with rest. No NTG on hand. BP at home is good. Was 130/70 at home this morning.   Current Outpatient Prescriptions on File Prior to Visit  Medication Sig Dispense Refill  . aspirin EC 81 MG tablet Take 81 mg by mouth daily.      [name] atorvastatin (LIPITOR) 40 MG tablet Take 40 mg by mouth 2 (two) times daily.       . Calcium Carbonate-Vitamin D (CALCIUM 600 + D PO) Take 1 tablet by mouth daily.       . Cholecalciferol (VITAMIN D PO) Take 1 tablet by mouth daily.      . cilostazol (PLETAL) 50 MG tablet Take 1 tablet (50 mg total) by mouth 2 (two) times daily.  60 tablet  11  . citalopram (CELEXA) 20 MG tablet Take 20 mg by mouth daily.       . cloNIDine (CATAPRES) 0.1 MG tablet Take 1 tablet (0.1 mg total) by mouth 2 (two) times daily.  60 tablet  0  . dorzolamide-timolol (COSOPT) 22.3-6.8 MG/ML ophthalmic solution Place 1 drop into both eyes 2 (two) times daily.       . furosemide (LASIX) 40 MG tablet Take 1 tablet (40 mg total) by mouth daily.  30 tablet  6   . hydrALAZINE (APRESOLINE) 25 MG tablet Take 1 tablet (25 mg total) by mouth 3 (three) times daily.  90 tablet  3  . latanoprost (XALATAN) 0.005 % ophthalmic solution Place 1 drop into both eyes at bedtime.       [name] levothyroxine (SYNTHROID, LEVOTHROID) 75 MCG tablet Take 150 mcg by mouth daily.      . metoprolol (TOPROL-XL) 50 MG 24 hr tablet Take 50 mg by mouth daily.        . potassium chloride (K-DUR,KLOR-CON) 10 MEQ tablet Take 10 mEq by mouth daily.        Allergies  Allergen Reactions  . Amlodipine   . Benicar (Olmesartan Medoxomil)     Past Medical History  Diagnosis Date  . Hypertension   . Diabetes mellitus     type 2  . Hypercholesterolemia   . Deafness   . Carotid bruit   . GERD (gastroesophageal reflux disease)   . Glaucoma   . Hypothyroidism   . PAT (paroxysmal atrial tachycardia)   . PAD (peripheral artery disease)   . Diastolic heart failure     EF is 60%    Past Surgical History  Procedure Date  . Cholycystectomy   . Total  knee arthroplasty     right  . Left breast cyst removed     History  Smoking status  . Never Smoker   Smokeless tobacco  . Never Used    History  Alcohol Use No    Family History  Problem Relation Age of Onset  . Heart disease Mother   . Liver cancer Father   . Heart disease Sister   . Heart disease Brother   . Stroke Sister     Review of Systems: The review of systems is per the HPI.  All other systems were reviewed and are negative.  Physical Exam: There were no vitals taken for this visit. Patient is very pleasant and in no acute distress. She is deaf. Skin is warm and dry. Color is normal.  HEENT is unremarkable. Normocephalic/atraumatic. PERRL. Sclera are nonicteric. Neck is supple. No masses. No JVD. Lungs are clear. Cardiac exam shows a regular rate and rhythm. Abdomen is soft. Extremities are without edema. Gait and ROM are intact. No gross neurologic deficits noted.  LABORATORY DATA: BMET is pending  Lab  Results  Component Value Date   WBC 5.8 11/27/2012   HGB 13.2 11/27/2012   HCT 38.4 11/27/2012   PLT 207 11/27/2012   GLUCOSE 109* 12/03/2012   ALT 243* 07/05/2011   AST 230* 07/05/2011   NA 131* 12/03/2012   K 4.0 12/03/2012   CL 100 12/03/2012   CREATININE 1.3* 12/03/2012   BUN 31* 12/03/2012   CO2 22 12/03/2012   TSH 1.389 07/05/2011   INR 1.91* 07/11/2011   HGBA1C 6.4* 11/27/2012   Echo Study Conclusions from December 2013  - Left ventricle: The cavity size was normal. Wall thickness was increased in a pattern of mild LVH. The estimated ejection fraction was 60%. Wall motion was normal; there were no regional wall motion abnormalities. Features are consistent with a pseudonormal left ventricular filling pattern, with concomitant abnormal relaxation and increased filling pressure (grade 2 diastolic dysfunction). Doppler parameters are consistent with high ventricular filling pressure. - Mitral valve: Mild regurgitation. - Left atrium: The atrium was mildly dilated. - Right ventricle: The cavity size was normal. Systolic function was normal. - Right atrium: The atrium was mildly dilated. - Pulmonary arteries: PA peak pressure: 16mm Hg (S).   Assessment / Plan:  1. Diastolic heart failure - seems pretty compensated at this time. Checking BMET today.   2. HTN - blood pressure is better. I have asked her to continue to monitor at home. No changes in her medicines.   3. PAD - managed medically.  4. Throat pain - remote cath back in 2005 with mild disease noted. No recent stress test. This may have been the trigger for her most recent admission. Will arrange for Lexiscan. NTG is sent to the drug store. She is reminded in how to use. See her back in 2 weeks with Dr. [name].   Patient is agreeable to this plan and will call if any problems develop in the interim.

## 2013-01-12 ENCOUNTER — Ambulatory Visit (HOSPITAL_COMMUNITY): Payer: Medicare Other | Attending: Cardiology | Admitting: Radiology

## 2013-01-12 VITALS — Ht 66.0 in | Wt 155.0 lb

## 2013-01-12 DIAGNOSIS — R079 Chest pain, unspecified: Secondary | ICD-10-CM | POA: Insufficient documentation

## 2013-01-12 DIAGNOSIS — E119 Type 2 diabetes mellitus without complications: Secondary | ICD-10-CM | POA: Insufficient documentation

## 2013-01-12 DIAGNOSIS — R070 Pain in throat: Secondary | ICD-10-CM

## 2013-01-12 DIAGNOSIS — R0989 Other specified symptoms and signs involving the circulatory and respiratory systems: Secondary | ICD-10-CM | POA: Insufficient documentation

## 2013-01-12 DIAGNOSIS — I10 Essential (primary) hypertension: Secondary | ICD-10-CM | POA: Insufficient documentation

## 2013-01-12 DIAGNOSIS — R0602 Shortness of breath: Secondary | ICD-10-CM

## 2013-01-12 DIAGNOSIS — R002 Palpitations: Secondary | ICD-10-CM | POA: Insufficient documentation

## 2013-01-12 DIAGNOSIS — I739 Peripheral vascular disease, unspecified: Secondary | ICD-10-CM | POA: Insufficient documentation

## 2013-01-12 DIAGNOSIS — I2510 Atherosclerotic heart disease of native coronary artery without angina pectoris: Secondary | ICD-10-CM

## 2013-01-12 DIAGNOSIS — I5032 Chronic diastolic (congestive) heart failure: Secondary | ICD-10-CM

## 2013-01-12 DIAGNOSIS — R0609 Other forms of dyspnea: Secondary | ICD-10-CM | POA: Insufficient documentation

## 2013-01-12 MED ORDER — TECHNETIUM TC 99M SESTAMIBI GENERIC - CARDIOLITE
30.0000 | Freq: Once | INTRAVENOUS | Status: AC | PRN
  Administered 2013-01-12: 30 via INTRAVENOUS

## 2013-01-12 MED ORDER — REGADENOSON 0.4 MG/5ML IV SOLN
0.4000 mg | Freq: Once | INTRAVENOUS | Status: AC
  Administered 2013-01-12: 0.4 mg via INTRAVENOUS

## 2013-01-12 MED ORDER — TECHNETIUM TC 99M SESTAMIBI GENERIC - CARDIOLITE
10.0000 | Freq: Once | INTRAVENOUS | Status: AC | PRN
  Administered 2013-01-12: 10 via INTRAVENOUS

## 2013-01-12 MED ORDER — AMINOPHYLLINE 25 MG/ML IV SOLN
75.0000 mg | Freq: Once | INTRAVENOUS | Status: AC
  Administered 2013-01-12: 75 mg via INTRAVENOUS

## 2013-01-12 NOTE — Progress Notes (Signed)
[facility] [address] [place],  [id number] [phone]    Cardiology Nuclear Med Study  Veronica Young is a 79 y.o. female     MRN : [id number]     DOB: Jun 14, 1933  Procedure Date: 01/12/2013  Nuclear Med Background Indication for Stress Test:  Evaluation for Ischemia, and abnormal EKG History:  CHF, '05 Heart Catheterization-mild nonobstructive CAD, '10 Myocardial Perfusion Study-normal, EF=77%, and 11-2012 Echo: EF=60% Cardiac Risk Factors: Family History - CAD, Hypertension, Lipids, NIDDM and PAD  Symptoms: Chest Pain without exertion,  DOE, Palpitations and Rapid HR   Nuclear Pre-Procedure Caffeine/Decaff Intake:  None > 12 hrs NPO After: 8:00pm   Lungs:  clear O2 Sat: 94-98% after deep breath on room air. IV 0.9% NS with Angio Cath:  20g  IV Site: R Antecubital x 1, tolerated well IV Started by:  Irven Baltimore, RN  Chest Size (in):  36 Cup Size: C  Height: 5\' 6"  (1.676 m)  Weight:  155 lb (70.308 kg)  BMI:  Body mass index is 25.02 kg/(m^2). Tech Comments:  Toprol held x 24 hrs. Aminophylline 75 mg IVP given post recovery @ 10:21am due to persistent tightness in throat with resolution of symptoms.  Metoprolol 50 mg tablet taken at 10:28 post recovery.Irven Baltimore, RN  Dr. Sherren Mocha reviewed preliminary images and the repeated EKG done post 2nd images with ok for the patient to leave, and keep follow-up appointment with Truitt Merle, NP. Irven Baltimore, RN    Nuclear Med Study 1 or 2 day study: 1 day  Stress Test Type:  Carlton Adam  Reading MD: Kirk Ruths, MD  Order Authorizing Provider:  Peter [name] , MD, Truitt Merle, NP  Resting Radionuclide: Technetium 22m Sestamibi  Resting Radionuclide Dose: 11.0 mCi   Stress Radionuclide:  Technetium 50m Sestamibi  Stress Radionuclide Dose: 33.0 mCi           Stress Protocol Rest HR: 89 Stress HR: 126  Rest BP: 206/90 Stress BP: 177/72  Exercise Time (min): n/a METS: n/a    Predicted Max HR: 143 bpm % Max HR: 88.11 bpm Rate Pressure Product: 22302    Dose of Adenosine (mg):  n/a Dose of Lexiscan: 0.4 mg  Dose of Aminophylline:75 mg   Dose of Atropine (mg): n/a Dose of Dobutamine: n/a mcg/kg/min (at max HR)  Stress Test Technologist: Irven Baltimore, RN  Nuclear Technologist:  Charlton Amor, CNMT     Rest Procedure:  Myocardial perfusion imaging was performed at rest 45 minutes following the intravenous administration of Technetium 28m Sestamibi. Rest ECG: Sinus with frequent PACs, nonspecific ST changes.  Stress Procedure:  The patient received IV Lexiscan 0.4 mg over 15-seconds.  Technetium 78m Sestamibi injected at 30-seconds.The patient complained of chest and throat tightness with Lexiscan. There was a hypertensive response to [place], 214/93.There were marked EKG changes that persisted in recovery. Dr. Burt Knack in and discussed with patient with ok to continue testing with 2nd images. Quantitative spect images were obtained after a 45 minute delay. Stress ECG: Significant ST abnormalities consistent with ischemia.  QPS Raw Data Images:  Acquisition technically good; normal left ventricular size. Stress Images:  Normal homogeneous uptake in all areas of the myocardium. Rest Images:  Normal homogeneous uptake in all areas of the myocardium. Subtraction (SDS):  No evidence of ischemia. Transient Ischemic Dilatation (Normal <1.22):  1.18 Lung/Heart Ratio (Normal <0.45):  0.28  Quantitative Gated Spect Images QGS EDV:  n/a QGS ESV:  n/a  Impression Exercise Capacity:  Lexiscan with no exercise. BP Response:  Hypertensive blood pressure response. Clinical Symptoms:  There is chest tightness. ECG Impression:  Significant ST abnormalities consistent with ischemia. Comparison with Prior Nuclear Study: No images to compare  Overall Impression:  Low risk stress nuclear study with ECG changes; however no ischemia or infarction on perfusion images.  LV  Ejection Fraction: Study not gated.  LV Wall Motion:  Study not gated   [employer]

## 2013-01-13 ENCOUNTER — Telehealth: Payer: Self-pay | Admitting: Cardiology

## 2013-01-13 NOTE — Telephone Encounter (Signed)
Pt's dtr calling to get test results from yesterday, pls call [phone]

## 2013-01-13 NOTE — Telephone Encounter (Signed)
Spoke to patient's daughter Veronica Young results given.

## 2013-01-25 ENCOUNTER — Encounter: Payer: Self-pay | Admitting: Nurse Practitioner

## 2013-01-25 ENCOUNTER — Ambulatory Visit (INDEPENDENT_AMBULATORY_CARE_PROVIDER_SITE_OTHER): Payer: Medicare Other | Admitting: Nurse Practitioner

## 2013-01-25 VITALS — BP 180/82 | HR 56 | Ht 66.0 in | Wt 156.8 lb

## 2013-01-25 DIAGNOSIS — I10 Essential (primary) hypertension: Secondary | ICD-10-CM

## 2013-01-25 NOTE — Patient Instructions (Signed)
Keep monitoring your blood pressure at home and keep a diary  Keep weighing daily  Avoid salt  No change in your medicines today  Dr. [name] will see you in 3 months.  Call the [facility] office at [phone] if you have any questions, problems or concerns.

## 2013-01-25 NOTE — Progress Notes (Signed)
Stanton Kidney Date of Birth: 1933/06/21 Medical Record [id number]  History of Present Illness: Ms. Veronica Young is seen back today for a 2 week check. She is seen for Dr. [name]. She has HLD, glaucoma, hypothyroidism, PAD managed medically, HTN, Dm and is deaf. She was most recently admitted with malignant HTN. Medices were increased. She had associated diastolic heart failure and was diuresed. Renal function had not improved and we added hydralazine instead of ACE for her blood pressure. We have recently updated her Myoview due to some throat burning with walking that was relieved with rest. This was normal.  She comes in today. She is here with an interpreter. She is doing very well. Feels good. Weight has been stable at home. No Lasix taken. BP's at home are very good. Her blood pressure is always high here but we have checked her cuff and it correlates. She is avoiding salt. No swelling. Not short of breath. No chest pain. Overall, she feels like she is doing well.   Current Outpatient Prescriptions on File Prior to Visit  Medication Sig Dispense Refill  . aspirin EC 81 MG tablet Take 81 mg by mouth daily.      [name] atorvastatin (LIPITOR) 40 MG tablet Take 40 mg by mouth daily.       . Calcium Carbonate-Vitamin D (CALCIUM 600 + D PO) Take 1 tablet by mouth daily.       . Cholecalciferol (VITAMIN D PO) Take 1 tablet by mouth daily.      . citalopram (CELEXA) 20 MG tablet Take 20 mg by mouth daily.       . cloNIDine (CATAPRES) 0.1 MG tablet Take 0.2 mg by mouth at bedtime.       . dorzolamide-timolol (COSOPT) 22.3-6.8 MG/ML ophthalmic solution Place 1 drop into both eyes 2 (two) times daily.       . furosemide (LASIX) 40 MG tablet Take 40 mg by mouth daily as needed.      . hydrALAZINE (APRESOLINE) 25 MG tablet Take 25 mg by mouth 2 (two) times daily.       [name] latanoprost (XALATAN) 0.005 % ophthalmic solution Place 1 drop into both eyes at bedtime.       [name] levothyroxine (SYNTHROID, LEVOTHROID) 75  MCG tablet Take 150 mcg by mouth daily.      . metoprolol (TOPROL-XL) 50 MG 24 hr tablet Take 50 mg by mouth daily.        . potassium chloride (K-DUR,KLOR-CON) 10 MEQ tablet Take 10 mEq by mouth daily.        Allergies  Allergen Reactions  . Amlodipine   . Benicar (Olmesartan Medoxomil)     Past Medical History  Diagnosis Date  . Hypertension   . Diabetes mellitus     type 2  . Hypercholesterolemia   . Deafness   . Carotid bruit   . GERD (gastroesophageal reflux disease)   . Glaucoma   . Hypothyroidism   . PAT (paroxysmal atrial tachycardia)   . PAD (peripheral artery disease)   . Diastolic heart failure     EF is 60%  . Normal cardiac stress test January 2014    Past Surgical History  Procedure Date  . Cholycystectomy   . Total knee arthroplasty     right  . Left breast cyst removed     History  Smoking status  . Never Smoker   Smokeless tobacco  . Never Used    History  Alcohol Use No  Family History  Problem Relation Age of Onset  . Heart disease Mother   . Liver cancer Father   . Heart disease Sister   . Heart disease Brother   . Stroke Sister     Review of Systems: The review of systems is per the HPI.  All other systems were reviewed and are negative.  Physical Exam: BP 180/82  Pulse 56  Ht 5\' 6"  (1.676 m)  Wt 156 lb 12.8 oz (71.124 kg)  BMI 25.31 kg/m2 Patient is very pleasant and in no acute distress. She is deaf. Skin is warm and dry. Color is normal.  HEENT is unremarkable. Normocephalic/atraumatic. PERRL. Sclera are nonicteric. Neck is supple. No masses. No JVD. Lungs are clear. Cardiac exam shows a regular rate and rhythm. Abdomen is soft. Extremities are without edema. Gait and ROM are intact. No gross neurologic deficits noted.  LABORATORY DATA:  Myoview Overall Impression:   Low risk stress nuclear study with ECG changes; however no ischemia or infarction on perfusion images.  LV Ejection Fraction: Study not gated. LV Wall  Motion: Study not gated  Kirk Ruths   Lab Results  Component Value Date   NA 134* 01/08/2013   K 4.4 01/08/2013   CL 100 01/08/2013   CO2 29 01/08/2013    Assessment / Plan: 1. Recent negative Myoview  2. HTN - great outpatient control. We have checked her cuff in the past and it has checked out fine. I have left her on her current regimen for now.  3. PVD - sees Dr. Angelena Form later this month  4. Diastolic heart failure - she looks compensated. Continue with weights and avoidance of salt.   I will have her see Dr. [name] in about 3 months. No change with her current regimen.   Patient is agreeable to this plan and will call if any problems develop in the interim.

## 2013-01-26 ENCOUNTER — Telehealth: Payer: Self-pay | Admitting: Cardiology

## 2013-01-26 NOTE — Telephone Encounter (Signed)
Pt was told to double up on one of her meds and that afternoon she got sick. Pt has been using Mrs Veronica Young but she dose not like it and was wondering is there something else she can use.

## 2013-01-26 NOTE — Telephone Encounter (Signed)
I did not change any of her medicines at her visit yesterday. Please clarify her medicines with her daughter. Ms. Veronica Young is the safest option for her.

## 2013-01-26 NOTE — Telephone Encounter (Signed)
Pt was seen yesterday.  Wants to speak to [place].  Will forward to [facility].

## 2013-01-26 NOTE — Telephone Encounter (Signed)
Cecille Rubin spoke with daughter today, did not change any meds went over the ov with daughter and stated for the daughter to check all her meds this evening. Daughter verbally understood and will call back if any questions

## 2013-03-02 ENCOUNTER — Other Ambulatory Visit (HOSPITAL_COMMUNITY): Payer: Self-pay | Admitting: Nurse Practitioner

## 2013-04-13 ENCOUNTER — Encounter (INDEPENDENT_AMBULATORY_CARE_PROVIDER_SITE_OTHER): Payer: Medicare Other

## 2013-04-13 ENCOUNTER — Encounter: Payer: Self-pay | Admitting: Cardiovascular Disease

## 2013-04-13 ENCOUNTER — Ambulatory Visit (INDEPENDENT_AMBULATORY_CARE_PROVIDER_SITE_OTHER): Payer: Medicare Other | Admitting: Cardiovascular Disease

## 2013-04-13 VITALS — BP 180/100 | HR 66 | Ht 66.0 in | Wt 153.0 lb

## 2013-04-13 DIAGNOSIS — I739 Peripheral vascular disease, unspecified: Secondary | ICD-10-CM

## 2013-04-13 DIAGNOSIS — I10 Essential (primary) hypertension: Secondary | ICD-10-CM

## 2013-04-13 MED ORDER — CLONIDINE HCL 0.2 MG PO TABS: 0.2000 mg | ORAL_TABLET | Freq: Two times a day (BID) | ORAL | Status: DC

## 2013-04-13 NOTE — Progress Notes (Signed)
History of Present Illness: 79 yo WF with history of HTN, DM, HLD, deafness, GERD, hypothyroidism who is here today for PV follow up. She is followed in our cardiology office by Dr. Peter [name]. I last saw her in the [facility] clinic in October 2013. Non-invasive imaging March 2013 showed reduced ABI bilaterally with suggestion of bilateral distal SFA occlusions with reconstitution in the popliteal artery by collaterals. Carotid artery dopplers in 2010 with mild bilateral carotid artery disease. In March 2013, ABI 0.37 on the right and 0.42 on the left. 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 I had hoped we could pursue conservative management. I arranged a CT angiogram of the distal aorta and bilateral lower extremities which showed bilateral popliteal artery occlusions. She does not have flow limiting disease in the iliacs, common femorals or proximal or mid segments of bilateral SFA. We have elected for conservative management.   She is here for f/u and feels great. She has mild leg pain with walking but quickly resolves with rest. No rest pain during day or ulcerations. ABI today are stable (report not yet available).   Primary Care Physician: Lorenza Burton  Past Medical History  Diagnosis Date  . Hypertension   . Diabetes mellitus     type 2  . Hypercholesterolemia   . Deafness   . Carotid bruit   . GERD (gastroesophageal reflux disease)   . Glaucoma   . Hypothyroidism   . PAT (paroxysmal atrial tachycardia)   . PAD (peripheral artery disease)   . Diastolic heart failure     EF is 60%  . Normal cardiac stress test January 2014    Past Surgical History  Procedure Laterality Date  . Cholycystectomy    . Total knee arthroplasty      right  . Left breast cyst removed      Current Outpatient Prescriptions  Medication Sig Dispense Refill  . aspirin EC 81 MG tablet Take 81 mg by mouth daily.       [name] atorvastatin (LIPITOR) 40 MG tablet Take 40 mg by mouth daily.       . Calcium Carbonate-Vitamin D (CALCIUM 600 + D PO) Take 1 tablet by mouth daily.       . Cholecalciferol (VITAMIN D PO) Take 1 tablet by mouth daily.      . citalopram (CELEXA) 20 MG tablet Take 20 mg by mouth daily.       . cloNIDine (CATAPRES) 0.1 MG tablet Take 0.2 mg by mouth at bedtime.       . cloNIDine (CATAPRES) 0.1 MG tablet TAKE 1 TABLET (0.1 MG TOTAL) BY MOUTH 2 (TWO) TIMES DAILY.  60 tablet  9  . dorzolamide-timolol (COSOPT) 22.3-6.8 MG/ML ophthalmic solution Place 1 drop into both eyes 2 (two) times daily.       . furosemide (LASIX) 40 MG tablet Take 40 mg by mouth daily as needed.      . hydrALAZINE (APRESOLINE) 25 MG tablet Take 25 mg by mouth 2 (two) times daily.       [name] latanoprost (XALATAN) 0.005 % ophthalmic solution Place 1 drop into both eyes at bedtime.       [name] levothyroxine (SYNTHROID, LEVOTHROID) 75 MCG tablet Take 150 mcg by mouth daily.      . metoprolol (TOPROL-XL) 50 MG 24 hr tablet Take 50 mg by mouth daily.        . potassium  chloride (K-DUR,KLOR-CON) 10 MEQ tablet Take 10 mEq by mouth daily.       No current facility-administered medications for this visit.    Allergies  Allergen Reactions  . Amlodipine   . Benicar (Olmesartan Medoxomil)     History   Social History  . Marital Status: Married    Spouse Name: N/A    Number of Children: 4  . Years of Education: N/A   Occupational History  . Not on file.   Social History Main Topics  . Smoking status: Never Smoker   . Smokeless tobacco: Never Used  . Alcohol Use: No  . Drug Use: No  . Sexually Active: No   Other Topics Concern  . Not on file   Social History Narrative  . No narrative on file    Family History  Problem Relation Age of Onset  . Heart disease Mother   . Liver cancer Father   . Heart disease Sister   . Heart disease Brother   . Stroke Sister     Review of Systems:  As stated in the HPI and  otherwise negative.   BP 180/100  Pulse 66  Ht 5\' 6"  (1.676 m)  Wt 153 lb (69.4 kg)  BMI 24.71 kg/m2  Physical Examination: General: Well developed, well nourished, NAD HEENT: OP clear, mucus membranes moist SKIN: warm, dry. No rashes. Neuro: No focal deficits Musculoskeletal: Muscle strength 5/5 all ext Psychiatric: Mood and affect normal Neck: No JVD, no carotid bruits, no thyromegaly, no lymphadenopathy. Lungs:Clear bilaterally, no wheezes, rhonci, crackles Cardiovascular: Regular rate and rhythm. No murmurs, gallops or rubs. Abdomen:Soft. Bowel sounds present. Non-tender.  Extremities: No lower extremity edema. Pulses are non-palpable in the bilateral DP/PT.  Assessment and Plan:   1. PAD: She has severe disease in both legs with bilateral popliteal artery occlusions. ABI are stable. Will continue to manage conservatively with Pletal 50 mg po BID. Will repeat ABI in 6 months. I have explained that stenting is not effective behind the knee joint as risk of occlusion is high. If she develops rest pain or ulcerations, will have to consider bypass.   2. HTN: BP elevated today. Will increase clonidine to 0.2 mg po BID.

## 2013-04-13 NOTE — Patient Instructions (Addendum)
Your physician wants you to follow-up in:  6 months. You will receive a reminder letter in the mail two months in advance. If you don't receive a letter, please call our office to schedule the follow-up appointment.  Your physician has requested that you have an ankle brachial index (ABI). During this test an ultrasound and blood pressure cuff are used to evaluate the arteries that supply the arms and legs with blood. Allow thirty minutes for this exam. There are no restrictions or special instructions. To be done in 6 months on day of appt with Dr. Angelena Form  Your physician has recommended you make the following change in your medication:  Increase Clonidine to 0.2 mg by mouth twice daily

## 2013-05-04 ENCOUNTER — Encounter: Payer: Self-pay | Admitting: Cardiology

## 2013-05-04 ENCOUNTER — Ambulatory Visit (INDEPENDENT_AMBULATORY_CARE_PROVIDER_SITE_OTHER): Payer: Medicare Other | Admitting: Cardiology

## 2013-05-04 VITALS — BP 162/80 | HR 61 | Ht 66.0 in | Wt 152.8 lb

## 2013-05-04 DIAGNOSIS — I739 Peripheral vascular disease, unspecified: Secondary | ICD-10-CM

## 2013-05-04 DIAGNOSIS — I10 Essential (primary) hypertension: Secondary | ICD-10-CM

## 2013-05-04 DIAGNOSIS — I471 Supraventricular tachycardia: Secondary | ICD-10-CM

## 2013-05-04 DIAGNOSIS — E7800 Pure hypercholesterolemia, unspecified: Secondary | ICD-10-CM

## 2013-05-04 NOTE — Progress Notes (Signed)
Stanton Kidney Date of Birth: May 10, 1933 Medical Record [id number]  History of Present Illness: Ms. Veronica Young is seen back today for a followup visit. She has HLD, glaucoma, hypothyroidism, PAD managed medically, HTN, DM and is deaf. She also has diastolic heart failure. She was seen recently by Dr. Angelena Form and ABIs were stable. Her blood pressure was elevated at 180/100. Her clonidine dose was increased. She reports that her blood pressure readings at home have been good but are somewhat labile. She denies any increase in dyspnea or chest pain. Her claudication symptoms are stable. She reports that she no longer takes medication for her diabetes.  Current Outpatient Prescriptions on File Prior to Visit  Medication Sig Dispense Refill  . aspirin EC 81 MG tablet Take 81 mg by mouth daily.      [name] atorvastatin (LIPITOR) 40 MG tablet Take 40 mg by mouth daily.       . Calcium Carbonate-Vitamin D (CALCIUM 600 + D PO) Take 1 tablet by mouth daily.       . Cholecalciferol (VITAMIN D PO) Take 1 tablet by mouth daily.      . citalopram (CELEXA) 20 MG tablet Take 20 mg by mouth daily.       . cloNIDine (CATAPRES) 0.2 MG tablet Take 1 tablet (0.2 mg total) by mouth 2 (two) times daily.  60 tablet  6  . dorzolamide-timolol (COSOPT) 22.3-6.8 MG/ML ophthalmic solution Place 1 drop into both eyes 2 (two) times daily.       . furosemide (LASIX) 40 MG tablet Take 40 mg by mouth daily as needed.      . hydrALAZINE (APRESOLINE) 25 MG tablet Take 25 mg by mouth 2 (two) times daily.       [name] latanoprost (XALATAN) 0.005 % ophthalmic solution Place 1 drop into both eyes at bedtime.       [name] levothyroxine (SYNTHROID, LEVOTHROID) 75 MCG tablet Take 150 mcg by mouth daily.      . metoprolol (TOPROL-XL) 50 MG 24 hr tablet Take 50 mg by mouth daily.        . potassium chloride (K-DUR,KLOR-CON) 10 MEQ tablet Take 10 mEq by mouth daily.       No current facility-administered medications on file prior to visit.     Allergies  Allergen Reactions  . Amlodipine   . Benicar (Olmesartan Medoxomil)     Past Medical History  Diagnosis Date  . Hypertension   . Diabetes mellitus     type 2  . Hypercholesterolemia   . Deafness   . Carotid bruit   . GERD (gastroesophageal reflux disease)   . Glaucoma   . Hypothyroidism   . PAT (paroxysmal atrial tachycardia)   . PAD (peripheral artery disease)   . Diastolic heart failure     EF is 60%  . Normal cardiac stress test January 2014    Past Surgical History  Procedure Laterality Date  . Cholycystectomy    . Total knee arthroplasty      right  . Left breast cyst removed      History  Smoking status  . Never Smoker   Smokeless tobacco  . Never Used    History  Alcohol Use No    Family History  Problem Relation Age of Onset  . Heart disease Mother   . Liver cancer Father   . Heart disease Sister   . Heart disease Brother   . Stroke Sister     Review of Systems: The  review of systems is per the HPI.  All other systems were reviewed and are negative.  Physical Exam: BP 162/80  Pulse 61  Ht 5\' 6"  (1.676 m)  Wt 152 lb 12.8 oz (69.31 kg)  BMI 24.67 kg/m2  SpO2 96% Patient is very pleasant and in no acute distress. She is deaf. Skin is warm and dry. Color is normal.  HEENT is unremarkable. Normocephalic/atraumatic. PERRL. Sclera are nonicteric. Neck is supple. No masses. No JVD. Lungs are clear. Cardiac exam shows a regular rate and rhythm. Abdomen is soft. Extremities are without edema. Gait and ROM are intact. No gross neurologic deficits noted.  LABORATORY DATA:    Lab Results  Component Value Date   NA 134* 01/08/2013   K 4.4 01/08/2013   CL 100 01/08/2013   CO2 29 01/08/2013    Assessment / Plan: 1. HTN - she reports better blood pressure readings at home. Her blood pressure has improved with increasing clonidine dose. I recommended that she keep a diary of her readings and bring it with her on her next visit in 3  months. I stressed the importance of sodium restriction.  2. PAD - followed by Dr. Angelena Form. ABIs and symptoms are stable.  3. Diastolic heart failure - she looks compensated. Continue with weights and avoidance of salt.   I will have her see Cecille Rubin in 3 months.

## 2013-05-04 NOTE — Patient Instructions (Signed)
Avoid salt.  Continue your current therapy.  Keep a diary of your blood pressure and bring with you to your next visit.  I will have you see Cecille Rubin in 3 months.

## 2013-05-05 ENCOUNTER — Other Ambulatory Visit: Payer: Self-pay

## 2013-05-05 ENCOUNTER — Other Ambulatory Visit: Payer: Self-pay | Admitting: Cardiovascular Disease

## 2013-05-05 MED ORDER — CILOSTAZOL 50 MG PO TABS: 50.0000 mg | ORAL_TABLET | Freq: Two times a day (BID) | ORAL | Status: DC

## 2013-05-11 ENCOUNTER — Encounter: Payer: Self-pay | Admitting: Cardiology

## 2013-05-11 ENCOUNTER — Other Ambulatory Visit: Payer: Self-pay | Admitting: Cardiology

## 2013-06-23 ENCOUNTER — Telehealth: Payer: Self-pay | Admitting: Cardiology

## 2013-06-23 NOTE — Telephone Encounter (Signed)
New Problem  Pt's daughter is calling regarding eye surgery her mom is going to have in the next couple of weeks. She wants to speak with you regarding it.

## 2013-06-23 NOTE — Telephone Encounter (Signed)
OK to proceed with eye surgery from our standpoint.  Ande Therrell [name] MD, [facility]

## 2013-06-23 NOTE — Telephone Encounter (Signed)
Returned call to patient's daughter she stated mother needs to have glaucoma surgery in left eye.Stated surgery will be done at [facility] by Dr.Bonds.Stated wanted to check with Dr.Jordan to make sure this is ok.Message sent to [place].

## 2013-06-23 NOTE — Telephone Encounter (Signed)
Returned call to patient's daughter no answer.LMTC. 

## 2013-06-24 NOTE — Telephone Encounter (Signed)
Returned call to patient's daughter [name], Dr.Jordan advised ok to have eye surgery.

## 2013-08-06 ENCOUNTER — Ambulatory Visit (INDEPENDENT_AMBULATORY_CARE_PROVIDER_SITE_OTHER): Payer: Medicare Other | Admitting: Nurse Practitioner

## 2013-08-06 ENCOUNTER — Encounter: Payer: Self-pay | Admitting: Nurse Practitioner

## 2013-08-06 VITALS — BP 180/100 | HR 52 | Ht 66.0 in | Wt 152.4 lb

## 2013-08-06 DIAGNOSIS — I10 Essential (primary) hypertension: Secondary | ICD-10-CM

## 2013-08-06 NOTE — Progress Notes (Signed)
Stanton Kidney Date of Birth: 06/02/1933 Medical Record [id number]  History of Present Illness: Veronica Young is seen back today for a 3 month check. Seen for Dr. [name]. Sees Dr. Angelena Form for PV. She has HLD, glaucoma, hypothyroidism, PAD managed medically, labile HTN, DM and she is deaf. She has been admitted in December of 2013 with malignant HTN and had associated diastolic heart failure and was diuresed. Renal function did not improve and we added hydralazine instead of ACE for her blood pressure. Last Myoview from January of 2014 due to some throat burning with walking that was relieved with rest - this was normal.  Last seen by Dr. [name] back in May. Felt to be doing ok.   Tried to have her eye surgery earlier this month - she was not allowed to take her BP medicines - thus her blood pressure was sky high and the procedure was cancelled. She was watched there all day and had a CT of the head and had her BP treated.   Comes back today. Here with the sign language interpreter. Doing well. Felt bad for a while following this eye incident. Now doing better. Unfortunately, has not had her medicines this morning. Forgot to bring in her list from home but says that her readings at home for the most part are ok. No chest pain. Not short of breath. Feels ok. Watching her salt.    Current Outpatient Prescriptions  Medication Sig Dispense Refill  . ALPHAGAN P 0.1 % SOLN 0.1 drops every 8 (eight) hours.       [name] aspirin EC 81 MG tablet Take 81 mg by mouth daily.      [name] atorvastatin (LIPITOR) 40 MG tablet Take 40 mg by mouth daily.       . Calcium Carbonate-Vitamin D (CALCIUM 600 + D PO) Take 1 tablet by mouth daily.       . Cholecalciferol (VITAMIN D PO) Take 1 tablet by mouth daily.      . cilostazol (PLETAL) 50 MG tablet TAKE 1 TABLET BY MOUTH TWICE A DAY  60 tablet  6  . citalopram (CELEXA) 20 MG tablet Take 20 mg by mouth daily.       . cloNIDine (CATAPRES) 0.2 MG tablet Take 1 tablet (0.2 mg  total) by mouth 2 (two) times daily.  60 tablet  6  . dorzolamide-timolol (COSOPT) 22.3-6.8 MG/ML ophthalmic solution Place 1 drop into both eyes 2 (two) times daily.       . furosemide (LASIX) 40 MG tablet Take 40 mg by mouth daily as needed.      . hydrALAZINE (APRESOLINE) 25 MG tablet Take 25 mg by mouth 2 (two) times daily.       [name] latanoprost (XALATAN) 0.005 % ophthalmic solution Place 1 drop into both eyes at bedtime.       [name] levothyroxine (SYNTHROID, LEVOTHROID) 75 MCG tablet Take 150 mcg by mouth daily.      . metoprolol (TOPROL-XL) 50 MG 24 hr tablet Take 50 mg by mouth daily.        . potassium chloride (K-DUR,KLOR-CON) 10 MEQ tablet Take 10 mEq by mouth daily.       No current facility-administered medications for this visit.    Allergies  Allergen Reactions  . Amlodipine   . Benicar [Olmesartan Medoxomil]     Past Medical History  Diagnosis Date  . Hypertension   . Diabetes mellitus     type 2  . Hypercholesterolemia   .  Deafness   . Carotid bruit   . GERD (gastroesophageal reflux disease)   . Glaucoma   . Hypothyroidism   . PAT (paroxysmal atrial tachycardia)   . PAD (peripheral artery disease)   . Diastolic heart failure     EF is 60%  . Normal cardiac stress test January 2014    Past Surgical History  Procedure Laterality Date  . Cholycystectomy    . Total knee arthroplasty      right  . Left breast cyst removed      History  Smoking status  . Never Smoker   Smokeless tobacco  . Never Used    History  Alcohol Use No    Family History  Problem Relation Age of Onset  . Heart disease Mother   . Liver cancer Father   . Heart disease Sister   . Heart disease Brother   . Stroke Sister     Review of Systems: The review of systems is per the HPI.  All other systems were reviewed and are negative.  Physical Exam: BP 180/100  Pulse 52  Ht 5\' 6"  (1.676 m)  Wt 152 lb 6.4 oz (69.128 kg)  BMI 24.61 kg/m2 No medicines taken today.  Patient is  very pleasant and in no acute distress. She is deaf. She signs. Weight is unchanged. Skin is warm and dry. Color is normal.  HEENT is unremarkable. Normocephalic/atraumatic. PERRL. Sclera are nonicteric. Neck is supple. No masses. No JVD. Lungs are clear. Cardiac exam shows a regular rate and rhythm. Abdomen is soft. Extremities are without edema. Gait and ROM are intact. No gross neurologic deficits noted.  LABORATORY DATA:  Lab Results  Component Value Date   WBC 5.8 11/27/2012   HGB 13.2 11/27/2012   HCT 38.4 11/27/2012   PLT 207 11/27/2012   GLUCOSE 87 01/08/2013   ALT 243* 07/05/2011   AST 230* 07/05/2011   NA 134* 01/08/2013   K 4.4 01/08/2013   CL 100 01/08/2013   CREATININE 1.0 01/08/2013   BUN 13 01/08/2013   CO2 29 01/08/2013   TSH 1.389 07/05/2011   INR 1.91* 07/11/2011   HGBA1C 6.4* 11/27/2012     Assessment / Plan: 1. HTN - she has had no medicines today - she reports good control at home. I have left her on her current regimen. Will see her back in 3 months to reassess. I have stressed to her the importance of taking her medicines.   2. PAD - followed by Dr. Angelena Form - ABI's and symptoms have been stable  3. Diastolic heart failure - sodium restriction is imperative - she looks compensated at this time.   We will see her back in 3 months. No changes made today. Labs are checked by her PCP.   Patient is agreeable to this plan and will call if any problems develop in the interim.   Burtis Junes, RN, ANP-C [facility] [address] [place], [place]  [address]

## 2013-08-06 NOTE — Patient Instructions (Addendum)
Stay on your current medicines  If you decide to have the eye surgery - let Dr.Jordan know so we can send a letter saying that you have to take your BP medicines  Keep checking your blood pressure  Avoid salt as much as possible  I will see you in 3 months  Call the [place] office at [phone] if you have any questions, problems or concerns.

## 2013-09-19 ENCOUNTER — Emergency Department (HOSPITAL_COMMUNITY): Payer: Medicare Other

## 2013-09-19 ENCOUNTER — Encounter (HOSPITAL_COMMUNITY): Payer: Self-pay

## 2013-09-19 ENCOUNTER — Emergency Department (HOSPITAL_COMMUNITY)
Admission: EM | Admit: 2013-09-19 | Discharge: 2013-09-19 | Disposition: A | Discharge status: Home / Self Care | Payer: Medicare Other | Attending: Emergency Medicine | Admitting: Emergency Medicine

## 2013-09-19 DIAGNOSIS — K219 Gastro-esophageal reflux disease without esophagitis: Secondary | ICD-10-CM | POA: Insufficient documentation

## 2013-09-19 DIAGNOSIS — W19XXXA Unspecified fall, initial encounter: Secondary | ICD-10-CM

## 2013-09-19 DIAGNOSIS — E119 Type 2 diabetes mellitus without complications: Secondary | ICD-10-CM | POA: Insufficient documentation

## 2013-09-19 DIAGNOSIS — S0101XA Laceration without foreign body of scalp, initial encounter: Secondary | ICD-10-CM

## 2013-09-19 DIAGNOSIS — I10 Essential (primary) hypertension: Secondary | ICD-10-CM | POA: Insufficient documentation

## 2013-09-19 DIAGNOSIS — Z7982 Long term (current) use of aspirin: Secondary | ICD-10-CM | POA: Insufficient documentation

## 2013-09-19 DIAGNOSIS — Z79899 Other long term (current) drug therapy: Secondary | ICD-10-CM | POA: Insufficient documentation

## 2013-09-19 DIAGNOSIS — M25569 Pain in unspecified knee: Secondary | ICD-10-CM | POA: Insufficient documentation

## 2013-09-19 DIAGNOSIS — W06XXXA Fall from bed, initial encounter: Secondary | ICD-10-CM | POA: Insufficient documentation

## 2013-09-19 DIAGNOSIS — H9190 Unspecified hearing loss, unspecified ear: Secondary | ICD-10-CM | POA: Insufficient documentation

## 2013-09-19 DIAGNOSIS — I739 Peripheral vascular disease, unspecified: Secondary | ICD-10-CM | POA: Insufficient documentation

## 2013-09-19 DIAGNOSIS — S0990XA Unspecified injury of head, initial encounter: Secondary | ICD-10-CM

## 2013-09-19 DIAGNOSIS — W1809XA Striking against other object with subsequent fall, initial encounter: Secondary | ICD-10-CM | POA: Insufficient documentation

## 2013-09-19 DIAGNOSIS — S0100XA Unspecified open wound of scalp, initial encounter: Secondary | ICD-10-CM | POA: Insufficient documentation

## 2013-09-19 DIAGNOSIS — Y92009 Unspecified place in unspecified non-institutional (private) residence as the place of occurrence of the external cause: Secondary | ICD-10-CM | POA: Insufficient documentation

## 2013-09-19 DIAGNOSIS — E039 Hypothyroidism, unspecified: Secondary | ICD-10-CM | POA: Insufficient documentation

## 2013-09-19 DIAGNOSIS — H409 Unspecified glaucoma: Secondary | ICD-10-CM | POA: Insufficient documentation

## 2013-09-19 DIAGNOSIS — I5030 Unspecified diastolic (congestive) heart failure: Secondary | ICD-10-CM | POA: Insufficient documentation

## 2013-09-19 DIAGNOSIS — Y9389 Activity, other specified: Secondary | ICD-10-CM | POA: Insufficient documentation

## 2013-09-19 NOTE — ED Provider Notes (Signed)
CSN: [id number]     Arrival date & time 09/19/13  [id number] History   First MD Initiated Contact with Patient 09/19/13 0919     Chief Complaint  Patient presents with  . Head Laceration   (Consider location/radiation/quality/duration/timing/severity/associated sxs/prior Treatment) HPI Comments: Veronica Young is a 79 y.o. Female who describes a mechanical fall when she was attempting to get out of bed, this morning. She rolled to her side, and then accidentally rolled out of the bed, whereupon, she struck her head on a dresser. She did not lose consciousness. She called out to her daughter came to her and assisted her to stand. She was able to walk afterwards. She injured her head and her left knee, in the fall. Her daughter translates for her because she is hearing impaired. She has not had a recent illnesses. She does not take anticoagulants. She believes her tetanus,  is up-to-date. There are are no other known modifying factors.  Patient is a 79 y.o. female presenting with scalp laceration. The history is provided by the patient.  Head Laceration    Past Medical History  Diagnosis Date  . Hypertension   . Diabetes mellitus     type 2  . Hypercholesterolemia   . Deafness   . Carotid bruit   . GERD (gastroesophageal reflux disease)   . Glaucoma   . Hypothyroidism   . PAT (paroxysmal atrial tachycardia)   . PAD (peripheral artery disease)   . Diastolic heart failure     EF is 60%  . Normal cardiac stress test January 2014   Past Surgical History  Procedure Laterality Date  . Cholycystectomy    . Total knee arthroplasty      right  . Left breast cyst removed     Family History  Problem Relation Age of Onset  . Heart disease Mother   . Liver cancer Father   . Heart disease Sister   . Heart disease Brother   . Stroke Sister    History  Substance Use Topics  . Smoking status: Never Smoker   . Smokeless tobacco: Never Used  . Alcohol Use: No   OB History   Grav Para Term  Preterm Abortions TAB SAB Ect Mult Living                 Review of Systems  All other systems reviewed and are negative.    Allergies  Amlodipine and Benicar  Home Medications   Current Outpatient Rx  Name  Route  Sig  Dispense  Refill  . ALPHAGAN P 0.1 % SOLN      0.1 drops every 8 (eight) hours.          [name] aspirin EC 81 MG tablet   Oral   Take 81 mg by mouth daily.         [name] atorvastatin (LIPITOR) 40 MG tablet   Oral   Take 40 mg by mouth daily.          . Calcium Carbonate-Vitamin D (CALCIUM 600 + D PO)   Oral   Take 1 tablet by mouth daily.          . Cholecalciferol (VITAMIN D PO)   Oral   Take 1 tablet by mouth daily.         . cilostazol (PLETAL) 50 MG tablet      TAKE 1 TABLET BY MOUTH TWICE A DAY   60 tablet   6   . citalopram (CELEXA) 20  MG tablet   Oral   Take 20 mg by mouth daily.          . cloNIDine (CATAPRES) 0.2 MG tablet   Oral   Take 1 tablet (0.2 mg total) by mouth 2 (two) times daily.   60 tablet   6   . dorzolamide-timolol (COSOPT) 22.3-6.8 MG/ML ophthalmic solution   Both Eyes   Place 1 drop into both eyes 2 (two) times daily.          . furosemide (LASIX) 40 MG tablet   Oral   Take 40 mg by mouth daily as needed.         . hydrALAZINE (APRESOLINE) 25 MG tablet   Oral   Take 25 mg by mouth 2 (two) times daily.          [name] latanoprost (XALATAN) 0.005 % ophthalmic solution   Both Eyes   Place 1 drop into both eyes at bedtime.          [name] levothyroxine (SYNTHROID, LEVOTHROID) 75 MCG tablet   Oral   Take 150 mcg by mouth daily.         . metoprolol (TOPROL-XL) 50 MG 24 hr tablet   Oral   Take 50 mg by mouth daily.           . potassium chloride (K-DUR,KLOR-CON) 10 MEQ tablet   Oral   Take 10 mEq by mouth daily.          BP 168/54  Pulse 60  Temp(Src) 98.6 F (37 C) (Oral)  Resp 18  SpO2 94% Physical Exam  Nursing note and vitals reviewed. Constitutional: She is oriented to person,  place, and time. She appears well-developed and well-nourished.  HENT:  Head: Normocephalic.  Right parietal wound is partially covered by hematoma; there is a moderate amount of blood throughout the scalp adherent to hair.  Eyes: Conjunctivae and EOM are normal. Pupils are equal, round, and reactive to light.  Neck: Normal range of motion and phonation normal. Neck supple.  Cardiovascular: Normal rate, regular rhythm and intact distal pulses.   Pulmonary/Chest: Effort normal and breath sounds normal. No respiratory distress. She has no wheezes. She exhibits no tenderness.  Musculoskeletal: Normal range of motion.  Mild left knee tenderness with symmetric appearance as compared to the right. Left knee is stable. She is able to walk without a limp.  Neurological: She is alert and oriented to person, place, and time. She exhibits normal muscle tone.  Skin: Skin is warm and dry.  Psychiatric: She has a normal mood and affect. Her behavior is normal. Judgment and thought content normal.    ED Course  Procedures (including critical care time)  Wound care per nursing staff  Evaluation of the wound after cleansing;-gaping Right parietal laceration, requiring closure  LACERATION REPAIR Performed by: Richarda Blade Consent: Verbal consent obtained. Risks and benefits: risks, benefits and alternatives were discussed Patient identity confirmed: provided demographic data Time out performed prior to procedure Prepped and Draped in normal sterile fashion Wound explored Laceration Location: right parietal  Laceration Length: 11.5cm No Foreign Bodies seen or palpated  Irrigation method: gauze Amount of cleaning: standard Skin closure: Staples Number of sutures or staples: 11 Technique: staple Patient tolerance: Patient tolerated the procedure well with no immediate complications.   Labs Review Labs Reviewed - No data to display Imaging Review Ct Head Wo Contrast  09/19/2013   CLINICAL  DATA:  Injury post fall, right-sided laceration, tripped getting out of bed  striking head on dresser, initial encounter  EXAM: CT HEAD WITHOUT CONTRAST  TECHNIQUE: Contiguous axial images were obtained from the base of the skull through the vertex without intravenous contrast.  COMPARISON:  12/13/2011  FINDINGS: Minimal atrophy.  Normal ventricular morphology.  No midline shift or mass effect.  Small vessel chronic ischemic changes of deep cerebral white matter.  Benign basal ganglia calcifications bilaterally.  No intracranial hemorrhage, mass lesion, or evidence acute infarction.  No extra-axial fluid collections.  Right parietal scalp hematoma, soft tissue swelling, and skin clips.  Atherosclerotic calcifications at skullbase.  Bones and sinuses unremarkable.  IMPRESSION: Atrophy with small vessel chronic ischemic changes of deep cerebral white matter.  No acute intracranial abnormalities.   Electronically Signed   By: Lavonia Dana M.D.   On: 09/19/2013 11:03    MDM   1. Head injury, initial encounter   2. Fall, initial encounter   3. Laceration of scalp, initial encounter    Mechanical fall with scalp laceration. No intracranial abnormality. No other injury. She is stable for discharge with symptomatic treatment.  Nursing Notes Reviewed/ Care Coordinated, and agree without changes. Applicable Imaging Reviewed.  Interpretation of Laboratory Data incorporated into ED treatment    Plan: Home Medications- usual; Home Treatments and Observation- wound care; return here if the recommended treatment, does not improve the symptoms; Recommended follow up- return here if needed, staple removal in 8-10 days      Richarda Blade, MD 09/19/13 1547

## 2013-09-19 NOTE — ED Notes (Signed)
She states she tripped (did not pass out) this morning upon getting out of bed, fell and struck her head on her dresser.  She has a lac. At right lat. Parietal area which at present is not bleeding.

## 2013-10-13 ENCOUNTER — Ambulatory Visit: Payer: Medicare Other | Admitting: Cardiovascular Disease

## 2013-10-22 ENCOUNTER — Encounter: Payer: Self-pay | Admitting: Cardiovascular Disease

## 2013-10-22 ENCOUNTER — Encounter (INDEPENDENT_AMBULATORY_CARE_PROVIDER_SITE_OTHER): Payer: Self-pay

## 2013-10-22 ENCOUNTER — Ambulatory Visit (INDEPENDENT_AMBULATORY_CARE_PROVIDER_SITE_OTHER): Payer: Medicare Other | Admitting: Cardiovascular Disease

## 2013-10-22 VITALS — BP 186/98 | HR 93 | Wt 154.0 lb

## 2013-10-22 DIAGNOSIS — I10 Essential (primary) hypertension: Secondary | ICD-10-CM

## 2013-10-22 DIAGNOSIS — I739 Peripheral vascular disease, unspecified: Secondary | ICD-10-CM

## 2013-10-22 NOTE — Progress Notes (Signed)
History of Present Illness: 79 yo WF with history of HTN, DM, HLD, deafness, GERD, hypothyroidism who is here today for PV follow up. She is followed in our cardiology office by Dr. Peter [name]. I last saw her in the [facility] in October 2013. Non-invasive imaging March 2013 showed reduced ABI bilaterally with suggestion of bilateral distal SFA occlusions with reconstitution in the popliteal artery by collaterals. Carotid artery dopplers in 2010 with mild bilateral carotid artery disease. In March 2013, ABI 0.37 on the right and 0.42 on the left. 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 I had hoped we could pursue conservative management. I arranged a CT angiogram of the distal aorta and bilateral lower extremities which showed bilateral popliteal artery occlusions. She does not have flow limiting disease in the iliacs, common femorals or proximal or mid segments of bilateral SFA. We have elected for conservative management.   She is here for f/u and feels great. She has mild leg pain with walking but quickly resolves with rest. No rest pain during day or ulcerations. ABI stable April 2014. Her left hip and thigh ache at night. She reports SBP under 130 consistently at home. Not sure of BP meds.   Primary Care Physician: Joneen Caraway  Past Medical History  Diagnosis Date  . Hypertension   . Diabetes mellitus     type 2  . Hypercholesterolemia   . Deafness   . Carotid bruit   . GERD (gastroesophageal reflux disease)   . Glaucoma   . Hypothyroidism   . PAT (paroxysmal atrial tachycardia)   . PAD (peripheral artery disease)   . Diastolic heart failure     EF is 60%  . Normal cardiac stress test January 2014    Past Surgical History  Procedure Laterality Date  . Cholycystectomy    . Total knee arthroplasty      right  . Left breast cyst removed      Current Outpatient Prescriptions    Medication Sig Dispense Refill  . ALPHAGAN P 0.1 % SOLN 0.1 drops every 8 (eight) hours.       [name] aspirin EC 81 MG tablet Take 81 mg by mouth daily.      [name] atorvastatin (LIPITOR) 40 MG tablet Take 40 mg by mouth daily.       . Calcium Carbonate-Vitamin D (CALCIUM 600 + D PO) Take 1 tablet by mouth daily.       . Cholecalciferol (VITAMIN D PO) Take 1 tablet by mouth daily.      . cilostazol (PLETAL) 50 MG tablet TAKE 1 TABLET BY MOUTH TWICE A DAY  60 tablet  6  . citalopram (CELEXA) 20 MG tablet Take 20 mg by mouth daily.       . dorzolamide-timolol (COSOPT) 22.3-6.8 MG/ML ophthalmic solution Place 1 drop into both eyes 2 (two) times daily.       . furosemide (LASIX) 40 MG tablet Take 40 mg by mouth daily as needed.      . hydrALAZINE (APRESOLINE) 25 MG tablet Take 25 mg by mouth 2 (two) times daily.       [name] latanoprost (XALATAN) 0.005 % ophthalmic solution Place 1 drop into both eyes at bedtime.       . Levothyroxine Sodium (SYNTHROID PO) Take by mouth. As directed      . metoprolol (TOPROL-XL) 50 MG 24 hr tablet Take 50 mg by mouth  daily.        . potassium chloride (K-DUR,KLOR-CON) 10 MEQ tablet Take 10 mEq by mouth daily.       No current facility-administered medications for this visit.    Allergies  Allergen Reactions  . Amlodipine   . Benicar [Olmesartan Medoxomil]     History   Social History  . Marital Status: Married    Spouse Name: N/A    Number of Children: 4  . Years of Education: N/A   Occupational History  . Not on file.   Social History Main Topics  . Smoking status: Never Smoker   . Smokeless tobacco: Never Used  . Alcohol Use: No  . Drug Use: No  . Sexual Activity: No   Other Topics Concern  . Not on file   Social History Narrative  . No narrative on file    Family History  Problem Relation Age of Onset  . Heart disease Mother   . Liver cancer Father   . Heart disease Sister   . Heart disease Brother   . Stroke Sister     Review of Systems:   As stated in the HPI and otherwise negative.   BP 186/98  Pulse 93  Wt 154 lb (69.854 kg)  BMI 24.87 kg/m2  Physical Examination: General: Well developed, well nourished, NAD HEENT: OP clear, mucus membranes moist SKIN: warm, dry. No rashes. Neuro: No focal deficits Musculoskeletal: Muscle strength 5/5 all ext Psychiatric: Mood and affect normal Neck: No JVD, no carotid bruits, no thyromegaly, no lymphadenopathy. Lungs:Clear bilaterally, no wheezes, rhonci, crackles Cardiovascular: Regular rate and rhythm. No murmurs, gallops or rubs. Abdomen:Soft. Bowel sounds present. Non-tender.  Extremities: No lower extremity edema. Pulses are non-palpable in the bilateral DP/PT.  EKG: Junctional tachycardia, rate 93 bpm. Diffuse ST depression, unchanged from old ekg.   Assessment and Plan:   1. PAD: She has severe disease in both legs with bilateral popliteal artery occlusions. ABI are stable. Will continue to manage conservatively with Pletal 50 mg po BID. Will repeat ABI in 6 months. I have explained that stenting is not effective behind the knee joint as risk of occlusion is high. If she develops rest pain or ulcerations, will have to consider bypass. Repeat ABI will be arranged today.   2. HTN: BP elevated today but pt and daughter report good control at home.

## 2013-10-22 NOTE — Patient Instructions (Signed)
Your physician wants you to follow-up in:  12 months.  You will receive a reminder letter in the mail two months in advance. If you don't receive a letter, please call our office to schedule the follow-up appointment.  Your physician has requested that you have an ankle brachial index (ABI). During this test an ultrasound and blood pressure cuff are used to evaluate the arteries that supply the arms and legs with blood. Allow thirty minutes for this exam. There are no restrictions or special instructions.  

## 2013-10-29 ENCOUNTER — Encounter (HOSPITAL_COMMUNITY): Payer: Medicare Other

## 2013-11-05 ENCOUNTER — Telehealth: Payer: Self-pay | Admitting: Nurse Practitioner

## 2013-11-05 ENCOUNTER — Ambulatory Visit (HOSPITAL_COMMUNITY): Payer: Medicare Other | Attending: Cardiovascular Disease

## 2013-11-05 ENCOUNTER — Ambulatory Visit (INDEPENDENT_AMBULATORY_CARE_PROVIDER_SITE_OTHER): Payer: Medicare Other | Admitting: Nurse Practitioner

## 2013-11-05 ENCOUNTER — Encounter: Payer: Self-pay | Admitting: Internal Medicine

## 2013-11-05 ENCOUNTER — Encounter: Payer: Self-pay | Admitting: Nurse Practitioner

## 2013-11-05 VITALS — BP 160/80 | HR 45 | Ht 66.0 in | Wt 156.4 lb

## 2013-11-05 DIAGNOSIS — E785 Hyperlipidemia, unspecified: Secondary | ICD-10-CM | POA: Insufficient documentation

## 2013-11-05 DIAGNOSIS — E119 Type 2 diabetes mellitus without complications: Secondary | ICD-10-CM | POA: Insufficient documentation

## 2013-11-05 DIAGNOSIS — I10 Essential (primary) hypertension: Secondary | ICD-10-CM

## 2013-11-05 DIAGNOSIS — I739 Peripheral vascular disease, unspecified: Secondary | ICD-10-CM

## 2013-11-05 DIAGNOSIS — I70209 Unspecified atherosclerosis of native arteries of extremities, unspecified extremity: Secondary | ICD-10-CM | POA: Insufficient documentation

## 2013-11-05 LAB — BASIC METABOLIC PANEL
BUN: 19 mg/dL (ref 6–23)
CO2: 28 mEq/L (ref 19–32)
Calcium: 9.3 mg/dL (ref 8.4–10.5)
Chloride: 104 mEq/L (ref 96–112)
Creatinine, Ser: 1 mg/dL (ref 0.4–1.2)
GFR: 59.74 mL/min — ABNORMAL LOW (ref >60.00)
Glucose, Bld: 98 mg/dL (ref 70–99)
Potassium: 4.3 mEq/L (ref 3.5–5.1)
Sodium: 136 mEq/L (ref 135–145)

## 2013-11-05 MED ORDER — LISINOPRIL 5 MG PO TABS: 5.0000 mg | ORAL_TABLET | Freq: Every day | ORAL | Status: DC

## 2013-11-05 NOTE — Progress Notes (Signed)
Stanton Kidney Date of Birth: 30-Apr-1933 Medical Record [id number]  History of Present Illness: Veronica Young is seen back today for a 3 month check. Seen for Dr. [name]. She sees Dr. Angelena Form for PV. She has HLD, glaucoma, hypothyroidism, PVD, labile HTN, DM and she is deaf. She has diastolic heart failure and was last admitted back in December of 2013. Last Myoview from January of 2014 was normal.   Seen by me back in August - was basically doing ok. Saw Dr. Angelena Form at the end of October. Non-invasive imaging March 2013 showed reduced ABI bilaterally with suggestion of bilateral distal SFA occlusions with reconstitution in the popliteal artery by collaterals. Carotid artery dopplers in 2010 with mild bilateral carotid artery disease. In March 2013, ABI 0.37 on the right and 0.42 on the left. 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 He had hoped he could continue conservative management.  CT angiogram of the distal aorta and bilateral lower extremities showed bilateral popliteal artery occlusions. She did not have flow limiting disease in the iliacs, common femorals or proximal or mid segments of bilateral SFA. We have elected for conservative management.   Comes back today. Here with the sign language interpreter. I have also received a record of her AVS from her visit to PCP - Dr. Joneen Caraway. Looks like Lisinopril was stopped and Lotrel added. I have been under the assumption that we never put her back on her ACE after her admission last December due to renal failure. She is to have repeat ABI's later this morning here.   She has stopped her Lotrel - apparently "made her sick". Has also stopped her Hydralazine for the same reason. She was never taking Lisinopril. Has been off these medicines for the last 3 days. BP had been doing ok but little higher here today. She notes she now feels pretty good. Some  intermitted dizziness at times. No syncope. No chest pain. Not really using Lasix.    Current Outpatient Prescriptions  Medication Sig Dispense Refill  . ALPHAGAN P 0.1 % SOLN 0.1 drops every 8 (eight) hours.       [name] aspirin EC 81 MG tablet Take 81 mg by mouth daily.      [name] atorvastatin (LIPITOR) 80 MG tablet Take 80 mg by mouth daily.      . Calcium Carbonate-Vitamin D (CALCIUM 600 + D PO) Take 1 tablet by mouth daily.       . Cholecalciferol (VITAMIN D PO) Take 1 tablet by mouth daily.      . cilostazol (PLETAL) 50 MG tablet TAKE 1 TABLET BY MOUTH TWICE A DAY  60 tablet  6  . citalopram (CELEXA) 20 MG tablet Take 20 mg by mouth daily.       . dorzolamide-timolol (COSOPT) 22.3-6.8 MG/ML ophthalmic solution Place 1 drop into both eyes 2 (two) times daily.       . furosemide (LASIX) 40 MG tablet Take 40 mg by mouth daily as needed.      . latanoprost (XALATAN) 0.005 % ophthalmic solution Place 1 drop into both eyes at bedtime.       . Levothyroxine Sodium (SYNTHROID PO) Take 125 mcg by mouth daily. As directed      . metoprolol (TOPROL-XL) 50 MG 24 hr tablet Take 50 mg by mouth daily.        . potassium chloride (K-DUR,KLOR-CON) 10 MEQ tablet Take 10 mEq  by mouth every other day.        No current facility-administered medications for this visit.    Allergies  Allergen Reactions  . Amlodipine   . Benicar [Olmesartan Medoxomil]     Past Medical History  Diagnosis Date  . Hypertension   . Diabetes mellitus     type 2  . Hypercholesterolemia   . Deafness   . Carotid bruit   . GERD (gastroesophageal reflux disease)   . Glaucoma   . Hypothyroidism   . PAT (paroxysmal atrial tachycardia)   . PAD (peripheral artery disease)   . Diastolic heart failure     EF is 60%  . Normal cardiac stress test January 2014    Past Surgical History  Procedure Laterality Date  . Cholycystectomy    . Total knee arthroplasty      right  . Left breast cyst removed      History  Smoking  status  . Never Smoker   Smokeless tobacco  . Never Used    History  Alcohol Use No    Family History  Problem Relation Age of Onset  . Heart disease Mother   . Liver cancer Father   . Heart disease Sister   . Heart disease Brother   . Stroke Sister     Review of Systems: The review of systems is per the HPI.  All other systems were reviewed and are negative.  Physical Exam: BP 160/80  Pulse 45  Ht 5\' 6"  (1.676 m)  Wt 156 lb 6.4 oz (70.943 kg)  BMI 25.26 kg/m2  SpO2 96% Patient is very pleasant and in no acute distress. She is deaf. Skin is warm and dry. Color is normal.  HEENT is unremarkable. Normocephalic/atraumatic. PERRL. Sclera are nonicteric. Neck is supple. No masses. No JVD. Lungs are clear. Cardiac exam shows a regular rate and rhythm. Rate is 60 by me. Abdomen is soft. Extremities are without edema. Gait and ROM are intact. No gross neurologic deficits noted.  Wt Readings from Last 3 Encounters:  11/05/13 156 lb 6.4 oz (70.943 kg)  10/22/13 154 lb (69.854 kg)  08/06/13 152 lb 6.4 oz (69.128 kg)    LABORATORY DATA: PENDING  Lab Results  Component Value Date   WBC 5.8 11/27/2012   HGB 13.2 11/27/2012   HCT 38.4 11/27/2012   PLT 207 11/27/2012   GLUCOSE 87 01/08/2013   ALT 243* 07/05/2011   AST 230* 07/05/2011   NA 134* 01/08/2013   K 4.4 01/08/2013   CL 100 01/08/2013   CREATININE 1.0 01/08/2013   BUN 13 01/08/2013   CO2 29 01/08/2013   TSH 1.389 07/05/2011   INR 1.91* 07/11/2011   HGBA1C 6.4* 11/27/2012   Echo Study Conclusions from December 2013  - Left ventricle: The cavity size was normal. Wall thickness was increased in a pattern of mild LVH. The estimated ejection fraction was 60%. Wall motion was normal; there were no regional wall motion abnormalities. Features are consistent with a pseudonormal left ventricular filling pattern, with concomitant abnormal relaxation and increased filling pressure (grade 2 diastolic dysfunction). Doppler parameters  are consistent with high ventricular filling pressure. - Mitral valve: Mild regurgitation. - Left atrium: The atrium was mildly dilated. - Right ventricle: The cavity size was normal. Systolic function was normal. - Right atrium: The atrium was mildly dilated. - Pulmonary arteries: PA peak pressure: 32mm Hg (S). - Impressions: IVC is not dilated.    Assessment / Plan:  1. Labile HTN -  med list was not correct.  She was placed on Lotrel - not taking due to adverse effect. Also not on the hydralazine for the same reason. Has been off of her ACE since December. Last BMET was normal and she has been on ACE in the past - will try to restart but at a lower dose - will need a repeat BMET and renal function will need to be followed closely. Need for her to bring all of her actual medicines to all of her appointments. I have asked her to continue to monitor her readings. I will see her back in 2 weeks.   2. PVD - followed by Dr. Angelena Form - for repeat ABIs this morning  3. Diastolic HF - grade 2 by echo - looks compensated. BP control crucial. Will try to add back the ACE but at low dose.   Patient is agreeable to this plan and will call if any problems develop in the interim.   Burtis Junes, RN, [facility] [address] [place], [place]  [address]

## 2013-11-05 NOTE — Telephone Encounter (Deleted)
Error

## 2013-11-05 NOTE — Patient Instructions (Signed)
We need to check labs today  I am restarting your Lisinopril but at a lower dose - 5 mg a day  I will see you in 2 weeks and repeat lab  Monitor your blood pressure at home and keep a diary for me to look at   I will send Dr. Prince Rome a note outlining what we have done with your medicines  Call the [place] office at [phone] if you have any questions, problems or concerns.

## 2013-11-08 NOTE — Telephone Encounter (Signed)
S/w pt's daughter [name] to reschedule pt appt to December 1st pt daughter was grateful and is off the day of appt so can come with pt. Stated pt is monitoring bp at three times a day

## 2013-11-15 ENCOUNTER — Telehealth: Payer: Self-pay | Admitting: Nurse Practitioner

## 2013-11-15 NOTE — Telephone Encounter (Signed)
New message     Medication was changed weeks ago and now pt c/o sob

## 2013-11-15 NOTE — Telephone Encounter (Signed)
Pt called back and stated she would like call back after reviewing with MD (  When I spoke with her earlier she said it was OK to call daughter) in case there were more questions.  Pt aware we will review with Truitt Merle, NP tomorrow as Dr. [name] not in office.  Pt aware to go to ED if shortness of breath or other symptoms worsen.

## 2013-11-15 NOTE — Telephone Encounter (Signed)
Received call from scheduling that interpreter was on phone calling for pt who was complaining of shortness of breath.  I spoke with interpreter who was interpreting for pt through video call.  She reports pt is continuing to have shortness of breath and dizziness and wondering if it could be related to medicine changes made at recent visit with Truitt Merle, NP.  Call somewhat difficult due to interpreting but pt reports shortness of breath started about 2-3 weeks ago and has not worsened since visit with Truitt Merle, NP.  Had episode of shortness of breath this AM but none at present time. Dizziness is the same as when she saw Cecille Rubin.  Vital signs as follows- 11/23-147/67,54. 11/22-162/64,heart rate in 50's 11/21 and 11/20--did not record. 11/19-139/90-50's 11/18-167/68.  I offered pt appt tomorrow with Truitt Merle, NP but she is unable to come in due to another appt. Will forward to Dr. [name] for review.

## 2013-11-16 NOTE — Telephone Encounter (Signed)
Patient called no answer.LMTC. 

## 2013-11-16 NOTE — Telephone Encounter (Signed)
Veronica Young, Can we find out if her weight is up? May need to take a dose of Lasix. ?extra salt use??

## 2013-11-16 NOTE — Telephone Encounter (Signed)
Spoke to patient she stated she feels ok this morning,no sob,no dizziness.Stated she rested well last night,B/P 167/68,155/73.Patient wants to know what to do if she gets dizzy again.Spoke to Truitt Merle NP she advised if gets dizzy sit down and rest.Lori advised to continue same medications,keep diary of B/P readings and keep appointment with her 11/22/13.

## 2013-11-19 ENCOUNTER — Ambulatory Visit: Payer: Medicare Other | Admitting: Nurse Practitioner

## 2013-11-22 ENCOUNTER — Ambulatory Visit (INDEPENDENT_AMBULATORY_CARE_PROVIDER_SITE_OTHER): Payer: Medicare Other | Admitting: Nurse Practitioner

## 2013-11-22 ENCOUNTER — Other Ambulatory Visit: Payer: Self-pay | Admitting: *Deleted

## 2013-11-22 VITALS — BP 180/70 | HR 60 | Ht 66.0 in | Wt 154.4 lb

## 2013-11-22 DIAGNOSIS — R42 Dizziness and giddiness: Secondary | ICD-10-CM

## 2013-11-22 DIAGNOSIS — E119 Type 2 diabetes mellitus without complications: Secondary | ICD-10-CM

## 2013-11-22 DIAGNOSIS — I10 Essential (primary) hypertension: Secondary | ICD-10-CM

## 2013-11-22 LAB — HEPATIC FUNCTION PANEL
ALT: 18 U/L (ref 0–35)
AST: 21 U/L (ref 0–37)
Albumin: 3.5 g/dL (ref 3.5–5.2)
Alkaline Phosphatase: 84 U/L (ref 39–117)
Bilirubin, Direct: 0.1 mg/dL (ref 0.0–0.3)
Total Bilirubin: 0.6 mg/dL (ref 0.3–1.2)
Total Protein: 6.7 g/dL (ref 6.0–8.3)

## 2013-11-22 LAB — BASIC METABOLIC PANEL
BUN: 18 mg/dL (ref 6–23)
CO2: 27 mEq/L (ref 19–32)
Calcium: 8.9 mg/dL (ref 8.4–10.5)
Chloride: 103 mEq/L (ref 96–112)
Creatinine, Ser: 1 mg/dL (ref 0.4–1.2)
GFR: 54.46 mL/min — ABNORMAL LOW (ref >60.00)
Glucose, Bld: 82 mg/dL (ref 70–99)
Potassium: 4.2 mEq/L (ref 3.5–5.1)
Sodium: 138 mEq/L (ref 135–145)

## 2013-11-22 LAB — HEMOGLOBIN A1C: Hgb A1c MFr Bld: 6.3 % (ref 4.6–6.5)

## 2013-11-22 NOTE — Progress Notes (Signed)
Veronica Young Date of Birth: 1933-08-08 Medical Record [id number]  History of Present Illness: Veronica Young is seen back today for a 2 week check. Seen for Dr. [name]. Sees Dr. Angelena Form for her PVD. She has HLD, glaucoma, hypothyroidism, PVD, labile HTN, DM and she is deaf. She has diastolic heart failure (grade 2 by echo) and was last admitted back in December of 2013. Last Myoview from January of 2014 was normal. Lipids are followed by PCP.   Seen by me back in August - was basically doing ok. Saw Dr. Angelena Form at the end of October. Non-invasive imaging March 2013 showed reduced ABI bilaterally with suggestion of bilateral distal SFA occlusions with reconstitution in the popliteal artery by collaterals. Carotid artery dopplers in 2010 with mild bilateral carotid artery disease. In March 2013, ABI 0.37 on the right and 0.42 on the left. 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 He had hoped he could continue conservative management. CT angiogram of the distal aorta and bilateral lower extremities showed bilateral popliteal artery occlusions. She did not have flow limiting disease in the iliacs, common femorals or proximal or mid segments of bilateral SFA. We have elected for conservative management.   Seen 2 weeks ago for follow up and had also received a record of her AVS from her visit to PCP - Dr. Joneen Caraway. Looks like Lisinopril was stopped and Lotrel added. I had been under the assumption that we never put her back on her ACE after her admission last December due to renal failure. She is to have repeat ABI's later this morning here.  She had stopped her Lotrel - apparently "made her sick". Had also stopped her Hydralazine for the same reason. She was never taking Lisinopril. BP was already tracking up and we started low dose ACE.   She has called with some dizzy spells and dyspnea. BP has been fair  - certainly not low.   Comes back today for follow up. Here with her daughter who also interprets for her. Brings in readings from home - averaging about 150's - some lower and some higher. HR in the mid 50's. Has had some dizzy spells that she feels is from her Lisinopril. Notes that she has transient dizziness, gets hot, little short of breath then a headache - then it goes away. No passing out. Does not check her BP during these spells. Not checking her blood sugar. Not quite sure about her medicines - daughter needs to clarify. No chest pain. Doing a good job about watching her salt. Tells me that she had a fall at the end of September - had 9 stitches in her head - negative CT. Not having progressive headaches, lethargy, etc. No more falls.   Current Outpatient Prescriptions  Medication Sig Dispense Refill  . ALPHAGAN P 0.1 % SOLN 0.1 drops every 8 (eight) hours.       [name] aspirin EC 81 MG tablet Take 81 mg by mouth daily.      [name] atorvastatin (LIPITOR) 80 MG tablet Take 80 mg by mouth daily.      . Calcium Carbonate-Vitamin D (CALCIUM 600 + D PO) Take 1 tablet by mouth daily.       . Cholecalciferol (VITAMIN D PO) Take 1 tablet by mouth daily.      . cilostazol (PLETAL) 50 MG tablet TAKE 1 TABLET BY MOUTH TWICE A DAY  60 tablet  6  . citalopram (CELEXA) 20 MG tablet Take 20 mg by mouth daily.       . dorzolamide-timolol (COSOPT) 22.3-6.8 MG/ML ophthalmic solution Place 1 drop into both eyes 2 (two) times daily.       . furosemide (LASIX) 40 MG tablet Take 40 mg by mouth daily as needed.      . latanoprost (XALATAN) 0.005 % ophthalmic solution Place 1 drop into both eyes at bedtime.       . Levothyroxine Sodium (SYNTHROID PO) Take 125 mcg by mouth daily. As directed      . lisinopril (PRINIVIL,ZESTRIL) 5 MG tablet Take 1 tablet (5 mg total) by mouth daily.  90 tablet  3  . metoprolol (TOPROL-XL) 50 MG 24 hr tablet Take 50 mg by mouth daily.        . potassium chloride (K-DUR,KLOR-CON) 10 MEQ  tablet Take 10 mEq by mouth every other day.        No current facility-administered medications for this visit.    Allergies  Allergen Reactions  . Amlodipine   . Benicar [Olmesartan Medoxomil]   . Hydralazine     "made me sick"    Past Medical History  Diagnosis Date  . Hypertension   . Diabetes mellitus     type 2  . Hypercholesterolemia   . Deafness   . Carotid bruit   . GERD (gastroesophageal reflux disease)   . Glaucoma   . Hypothyroidism   . PAT (paroxysmal atrial tachycardia)   . PAD (peripheral artery disease)   . Diastolic heart failure     EF is 60%  . Normal cardiac stress test January 2014    Past Surgical History  Procedure Laterality Date  . Cholycystectomy    . Total knee arthroplasty      right  . Left breast cyst removed      History  Smoking status  . Never Smoker   Smokeless tobacco  . Never Used    History  Alcohol Use No    Family History  Problem Relation Age of Onset  . Heart disease Mother   . Liver cancer Father   . Heart disease Sister   . Heart disease Brother   . Stroke Sister     Review of Systems: The review of systems is per the HPI.  All other systems were reviewed and are negative.  Physical Exam: BP 180/70  Pulse 60  Ht 5\' 6"  (1.676 m)  Wt 154 lb 6.4 oz (70.035 kg)  BMI 24.93 kg/m2  SpO2 95% Patient is very pleasant and in no acute distress. Skin is warm and dry. Color is normal.  HEENT is unremarkable. Normocephalic/atraumatic. PERRL. Sclera are nonicteric. Neck is supple. No masses. No JVD. Lungs are clear. Cardiac exam shows a regular rate and rhythm. Abdomen is soft. Extremities are without edema. Gait and ROM are intact. No gross neurologic deficits noted.  Wt Readings from Last 3 Encounters:  11/22/13 154 lb 6.4 oz (70.035 kg)  11/05/13 156 lb 6.4 oz (70.943 kg)  10/22/13 154 lb (69.854 kg)     LABORATORY DATA: PENDING   Lab Results  Component Value Date   WBC 5.8 11/27/2012   HGB 13.2 11/27/2012    HCT 38.4 11/27/2012   PLT 207 11/27/2012   GLUCOSE 98 11/05/2013   ALT 243* 07/05/2011   AST 230* 07/05/2011   NA 136 11/05/2013   K 4.3 11/05/2013   CL 104 11/05/2013   CREATININE 1.0 11/05/2013  BUN 19 11/05/2013   CO2 28 11/05/2013   TSH 1.389 07/05/2011   INR 1.91* 07/11/2011   HGBA1C 6.4* 11/27/2012     Assessment / Plan: 1. HTN - better control at home. Not sure what these dizzy spells are- could be her BP, her HR or blood sugar- will try flipping the administration times of her Metoprolol and Lisinopril. Daughter is going to clarify that we are on the right medicines. Recheck labs today. She will continue to monitor at home. See her back in about 6 weeks.   2. Diastolic HF - her weight is stable - looks compensated to me. Using Lasix just prn.   3. PVD - followed by Dr. Angelena Form  Patient is agreeable to this plan and will call if any problems develop in the interim.   Burtis Junes, RN, [facility] [address] [place], [place]  [address]

## 2013-11-22 NOTE — Patient Instructions (Addendum)
Stay on your current medicines but try flipping the administration of the metoprolol and the lisinopril  Let your daughter review all your medicine bottles  Keep monitoring your blood pressure - try to check when you are starting to have a dizzy spell or right after  We will check labs today  See Dr. [name] in 6 weeks  Call the [facility] office at [phone] if you have any questions, problems or concerns.

## 2013-12-02 ENCOUNTER — Telehealth: Payer: Self-pay | Admitting: Nurse Practitioner

## 2013-12-02 DIAGNOSIS — I10 Essential (primary) hypertension: Secondary | ICD-10-CM

## 2013-12-02 NOTE — Telephone Encounter (Signed)
New message  Patient feel that new BP med ( lisinopril)  is not as effective as old med's. She would like to be put back on ols medication as it seemed to work better. Please call and advise.

## 2013-12-02 NOTE — Telephone Encounter (Signed)
S/w daughter [name], hadn't really s/w pt this am will find out more details  I stated we will call back pt tomorrow. I will route this to [place].  Rose was agreeable to plan

## 2013-12-03 MED ORDER — LISINOPRIL 5 MG PO TABS: 5.0000 mg | ORAL_TABLET | Freq: Two times a day (BID) | ORAL | Status: DC

## 2013-12-03 NOTE — Telephone Encounter (Signed)
Veronica Young, Will you call her and see what is going on? She is on very low dose of her Lisinopril - if her BP is staying up we can certainly increase. She has not tolerated her other medicines (ARB, norvasc and hydralazine).

## 2013-12-03 NOTE — Telephone Encounter (Signed)
Spoke thru interrupter.  Pt states her BP is still elevated.  12/4- 152/73; 160/73; 12/10; 161/70; 12/11 148/70.  States she feel dizzy off and on. No other symptoms.  Per Kathrene Alu increase her Lisinopril to 5 mg BID.  Related information thru interrupter.  Also advised pt to continue to monitor BP.

## 2014-01-03 ENCOUNTER — Telehealth: Payer: Self-pay | Admitting: *Deleted

## 2014-01-03 NOTE — Telephone Encounter (Signed)
OK to hold Pletal for dental procedure for 5 days.  Alex Leahy [name] MD, [facility]

## 2014-01-03 NOTE — Telephone Encounter (Signed)
Call from patients dentist Stevenson Clinch. Pt needs tooth extraction. Dentist wants to know if patient needs to be OFF pletal for this procedure. Instructed will forward to Dr. [name] and his nurse for recommendation. Dentist also asked about recent INR-informed we do not monitor INR for pletal.

## 2014-01-04 NOTE — Telephone Encounter (Signed)
Returned call to Dr.Kele Barthelemy [facility] office spoke to [place], Dr.Jordan advised ok to hold pletal 5 days prior to tooth extraction.

## 2014-01-20 ENCOUNTER — Encounter: Payer: Self-pay | Admitting: Cardiology

## 2014-01-20 ENCOUNTER — Ambulatory Visit (INDEPENDENT_AMBULATORY_CARE_PROVIDER_SITE_OTHER): Payer: Medicare Other | Admitting: Cardiology

## 2014-01-20 VITALS — BP 164/78 | HR 58 | Ht 66.0 in | Wt 153.1 lb

## 2014-01-20 DIAGNOSIS — E7800 Pure hypercholesterolemia, unspecified: Secondary | ICD-10-CM

## 2014-01-20 DIAGNOSIS — I10 Essential (primary) hypertension: Secondary | ICD-10-CM

## 2014-01-20 MED ORDER — METOPROLOL SUCCINATE ER 25 MG PO TB24: 25.0000 mg | ORAL_TABLET | Freq: Every day | ORAL | Status: DC

## 2014-01-20 NOTE — Progress Notes (Signed)
Stanton Kidney Date of Birth: 1933-09-22 Medical Record [id number]  History of Present Illness: Ms. Veronica Young is seen back today for a follow up visit. She is seen with a sign language interpreter. She has HLD, glaucoma, hypothyroidism, PVD, labile HTN, DM and she is deaf. She has diastolic heart failure (grade 2 by echo) and was last admitted back in December of 2013. Last Myoview from January of 2014 was normal. Lipids are followed by PCP.  Seen recently by Truitt Merle NP. BP labile. meds adjusted. Conflicting reports of what she is actually taking. According to pt. Today she is on diamox, metoprolol, and lisinopril. She is not taking hydralazine. Records from home show fairly good control with BP range 124-176. Average about 0000000 systolic. She complains of HA once a week that she thinks is related to BP. Asks about taking amlodipine since a friend is on this. It is listed as an intolerance in her record and when prompted she thinks she remembers this. Is scheduled for glaucoma surgery.  Current Outpatient Prescriptions  Medication Sig Dispense Refill  . acetaZOLAMIDE (DIAMOX) 250 MG tablet 250 mg.      . aspirin EC 81 MG tablet Take 81 mg by mouth daily.      [name] atorvastatin (LIPITOR) 80 MG tablet Take 80 mg by mouth daily.      . brimonidine (ALPHAGAN P) 0.1 % SOLN       . Calcium Carbonate-Vitamin D (CALCIUM 600 + D PO) Take 1 tablet by mouth daily.       . Cholecalciferol (VITAMIN D PO) Take 1 tablet by mouth daily.      . cilostazol (PLETAL) 50 MG tablet TAKE 1 TABLET BY MOUTH TWICE A DAY  60 tablet  6  . citalopram (CELEXA) 20 MG tablet Take 20 mg by mouth daily.       . dorzolamide-timolol (COSOPT) 22.3-6.8 MG/ML ophthalmic solution 1 drop 2 (two) times daily.      . furosemide (LASIX) 40 MG tablet Take 40 mg by mouth daily as needed.      . latanoprost (XALATAN) 0.005 % ophthalmic solution Place 1 drop into both eyes at bedtime.       . Levothyroxine Sodium (SYNTHROID PO) Take 125  mcg by mouth daily. As directed      . lisinopril (PRINIVIL,ZESTRIL) 5 MG tablet Take 40 mg by mouth daily.      . pilocarpine (PILOCAR) 1 % ophthalmic solution 1 drop as directed.      . potassium chloride (K-DUR,KLOR-CON) 10 MEQ tablet Take 10 mEq by mouth every other day.       . prednisoLONE acetate (PRED FORTE) 1 % ophthalmic suspension 1 drop as directed.      . metoprolol succinate (TOPROL XL) 25 MG 24 hr tablet Take 1 tablet (25 mg total) by mouth daily.       No current facility-administered medications for this visit.    Allergies  Allergen Reactions  . Amlodipine   . Benicar [Olmesartan Medoxomil]   . Hydralazine     "made me sick"    Past Medical History  Diagnosis Date  . Hypertension   . Diabetes mellitus     type 2  . Hypercholesterolemia   . Deafness   . Carotid bruit   . GERD (gastroesophageal reflux disease)   . Glaucoma   . Hypothyroidism   . PAT (paroxysmal atrial tachycardia)   . PAD (peripheral artery disease)   . Diastolic heart failure  EF is 60%  . Normal cardiac stress test January 2014    Past Surgical History  Procedure Laterality Date  . Cholycystectomy    . Total knee arthroplasty      right  . Left breast cyst removed      History  Smoking status  . Never Smoker   Smokeless tobacco  . Never Used    History  Alcohol Use No    Family History  Problem Relation Age of Onset  . Heart disease Mother   . Liver cancer Father   . Heart disease Sister   . Heart disease Brother   . Stroke Sister     Review of Systems: The review of systems is per the HPI.  All other systems were reviewed and are negative.  Physical Exam: BP 164/78  Pulse 58  Ht 5\' 6"  (1.676 m)  Wt 153 lb 1.9 oz (69.455 kg)  BMI 24.73 kg/m2  SpO2 99% Patient is very pleasant and in no acute distress. Skin is warm and dry. Color is normal.  HEENT is unremarkable. Normocephalic/atraumatic. PERRL. Sclera are nonicteric. Neck is supple. No masses. No JVD.  Lungs are clear. Cardiac exam shows a regular rate and rhythm. Abdomen is soft. Extremities are without edema. Gait and ROM are intact. No gross neurologic deficits noted.  Wt Readings from Last 3 Encounters:  01/20/14 153 lb 1.9 oz (69.455 kg)  11/22/13 154 lb 6.4 oz (70.035 kg)  11/05/13 156 lb 6.4 oz (70.943 kg)     LABORATORY DATA: PENDING   Lab Results  Component Value Date   WBC 5.8 11/27/2012   HGB 13.2 11/27/2012   HCT 38.4 11/27/2012   PLT 207 11/27/2012   GLUCOSE 82 11/22/2013   ALT 18 11/22/2013   AST 21 11/22/2013   NA 138 11/22/2013   K 4.2 11/22/2013   CL 103 11/22/2013   CREATININE 1.0 11/22/2013   BUN 18 11/22/2013   CO2 27 11/22/2013   TSH 1.389 07/05/2011   INR 1.91* 07/11/2011   HGBA1C 6.3 11/22/2013     Assessment / Plan: 1. HTN - control is still somewhat labile but I am happy with her current regimen. Told her if she has headache and BP is high she can take an extra metoprolol. She is cleared for eye surgery.   2. Diastolic HF - her weight is stable - looks compensated to me. Using Lasix just prn.   3. PVD - followed by Dr. Angelena Form

## 2014-01-20 NOTE — Patient Instructions (Addendum)
Continue your current therapy  If you have headache and your blood pressure is high I would recommend you take Tylenol and an extra Toprol XL 25 mg.  You may stop cilostazol ( pletal ) before dental work.  I will see you in 3 months.

## 2014-02-16 ENCOUNTER — Other Ambulatory Visit: Payer: Self-pay

## 2014-02-16 ENCOUNTER — Telehealth: Payer: Self-pay | Admitting: Cardiology

## 2014-02-16 MED ORDER — LISINOPRIL 40 MG PO TABS: 40.0000 mg | ORAL_TABLET | Freq: Every day | ORAL | Status: DC

## 2014-02-16 NOTE — Telephone Encounter (Signed)
New message     Refill lisinopril 5mg  to [facility]. Out of medication.

## 2014-02-16 NOTE — Telephone Encounter (Signed)
Pt's daughter called for Lisinopril refill.  Kerin Ransom PA-C 02/16/2014 7:29 PM

## 2014-02-16 NOTE — Telephone Encounter (Signed)
Patient called no answer.Left message to call back about refill for Lisinopril.

## 2014-02-21 NOTE — Telephone Encounter (Signed)
Spoke to patient through a interpreter received a message needing a refill for Lisinopril 5 mg.Patient stated she takes Lisinopril 40 mg daily.Refill already sent to pharmacy.

## 2014-03-08 ENCOUNTER — Other Ambulatory Visit: Payer: Self-pay | Admitting: Nurse Practitioner

## 2014-04-19 ENCOUNTER — Ambulatory Visit: Payer: Medicare HMO | Admitting: Cardiology

## 2014-04-20 ENCOUNTER — Ambulatory Visit (INDEPENDENT_AMBULATORY_CARE_PROVIDER_SITE_OTHER): Payer: Commercial Managed Care - HMO | Admitting: Physician Assistant

## 2014-04-20 ENCOUNTER — Encounter: Payer: Self-pay | Admitting: Physician Assistant

## 2014-04-20 VITALS — BP 200/82 | Wt 151.0 lb

## 2014-04-20 DIAGNOSIS — E785 Hyperlipidemia, unspecified: Secondary | ICD-10-CM

## 2014-04-20 DIAGNOSIS — I471 Supraventricular tachycardia: Secondary | ICD-10-CM

## 2014-04-20 DIAGNOSIS — I10 Essential (primary) hypertension: Secondary | ICD-10-CM

## 2014-04-20 MED ORDER — HYDRALAZINE HCL 10 MG PO TABS: 10.0000 mg | ORAL_TABLET | Freq: Three times a day (TID) | ORAL | Status: DC

## 2014-04-20 NOTE — Assessment & Plan Note (Signed)
No evidence of heart failure. 

## 2014-04-20 NOTE — Assessment & Plan Note (Addendum)
Blood pressure is significantly elevated today. Patient does not know what she is taking. I will start hydralazine 10 mg 3 times a day. It's on her list that possibly made her sick but she can't recall what has made her sick in the past. She thought she was taking amlodipine but it's on her allergy list as well. I've asked her to come back next week for a Bmet and nurse visit. She is to bring her actual pill bottles so that her medications can be verified and blood pressure be rechecked. Followup with Dr. [name] in one month and bring her pill bottles with her again.  Refer to [facility] for better BP control

## 2014-04-20 NOTE — Progress Notes (Signed)
HPI:  This is a 80 year old deaf patient of Dr. [name] and Dr. Theodoro Kos) who has history of hypertension, diabetes mellitus, hyperlipidemia, GERD, hypothyroidism and PVD. She has bilateral popliteal artery occlusions but her last ABIs in November 2014 were stable. There appears to be chronic total occlusion of both SFA and the distal segments with reconstitution at the popliteal via collaterals.  Patient comes in today with an interpreter. Her blood pressure is extremely high at 200/82. She says it runs about 0000000 or [id number] systolic at home. She follows a low sodium diet. There is a lot of confusion around her medications. Her list includes amlodipine/benazepril-40 mg although it says she's allergic to amlodipine.She is also on lisinopril 40 mg and metoprolol 25 mg daily. She takes these medications spread out throughout the day. She says she only took her lisinopril this morning. We called her pharmacy and they stated that the amlodipine/benazepril was stopped on February 12. They did verify that she is taking lisinopril 40 mg daily and metoprolol 25 mg daily. The patient is quite confused as to what she is taking.  Patient also states that she has lower leg pain after activity when she sits down. She says she can walk a mile a day without stopping or difficulty.  Allergies -- Amlodipine   -- Benicar [Olmesartan Medoxomil]   -- Hydralazine -- Nausea Only   --  "made me sick"            "made me sick"  Current Outpatient Prescriptions on File Prior to Visit: acetaZOLAMIDE (DIAMOX) 250 MG tablet, 250 mg., Disp: , Rfl:  aspirin EC 81 MG tablet, Take 81 mg by mouth daily., Disp: , Rfl:  atorvastatin (LIPITOR) 80 MG tablet, Take 80 mg by mouth daily., Disp: , Rfl:  brimonidine (ALPHAGAN P) 0.1 % SOLN, , Disp: , Rfl:  Calcium Carbonate-Vitamin D (CALCIUM 600 + D PO), Take 1 tablet by mouth daily. , Disp: , Rfl:  Cholecalciferol (VITAMIN D PO), Take 1 tablet by mouth daily., Disp: , Rfl:   cilostazol  (PLETAL) 50 MG tablet, TAKE 1 TABLET BY MOUTH TWICE A DAY, Disp: 60 tablet, Rfl: 6 citalopram (CELEXA) 20 MG tablet, Take 20 mg by mouth daily. , Disp: , Rfl:   dorzolamide-timolol (COSOPT) 22.3-6.8 MG/ML ophthalmic solution, 1 drop 2 (two) times daily., Disp: , Rfl:  latanoprost (XALATAN) 0.005 % ophthalmic solution, Place 1 drop into both eyes at bedtime. , Disp: , Rfl:  Levothyroxine Sodium (SYNTHROID PO), Take 125 mcg by mouth daily. As directed, Disp: , Rfl:  lisinopril (PRINIVIL,ZESTRIL) 40 MG tablet, Take 1 tablet (40 mg total) by mouth daily., Disp: 90 tablet, Rfl: 3 metoprolol succinate (TOPROL-XL) 25 MG 24 hr tablet, TAKE 1 TABLET BY MOUTH EVERY DAY, Disp: 90 tablet, Rfl: 0 pilocarpine (PILOCAR) 1 % ophthalmic solution, 1 drop as directed., Disp: , Rfl:  potassium chloride (K-DUR,KLOR-CON) 10 MEQ tablet, Take 10 mEq by mouth every other day. , Disp: , Rfl:  prednisoLONE acetate (PRED FORTE) 1 % ophthalmic suspension, 1 drop as directed., Disp: , Rfl:   No current facility-administered medications on file prior to visit.   Past Medical History:   Hypertension                                                 Diabetes mellitus  Comment:type 2   Hypercholesterolemia                                         Deafness                                                     Carotid bruit                                                GERD (gastroesophageal reflux disease)                       Glaucoma                                                     Hypothyroidism                                               PAT (paroxysmal atrial tachycardia)                          PAD (peripheral artery disease)                              Diastolic heart failure                                        Comment:EF is 60%   Normal cardiac stress test                      January 2*  Past Surgical History:   cholycystectomy                                                TOTAL KNEE ARTHROPLASTY                                         Comment:right   left breast cyst removed                                     Review of patient's family history indicates:   Heart disease                  Mother                   Liver cancer  Father                   Heart disease                  Sister                   Heart disease                  Brother                  Stroke                         Sister                   Social History   Marital Status: Married             Spouse Name:                      Years of Education:                 Number of children: 4           Occupational History   None on file  Social History Main Topics   Smoking Status: Never Smoker                     Smokeless Status: Never Used                       Alcohol Use: No             Drug Use: No             Sexual Activity: No                 Other Topics            Concern   None on file  Social History Narrative   None on file    ROS: See history of present illness otherwise negative   PHYSICAL EXAM: Well-nournished, in no acute distress. Neck: Bilateral carotid bruits, and No JVD, HJR, Bruit, or thyroid enlargement  Lungs: No tachypnea, clear without wheezing, rales, or rhonchi  Cardiovascular: RRR, PMI not displaced, heart sounds normal, no murmurs, gallops, bruit, thrill, or heave.  Abdomen: BS normal. Soft without organomegaly, masses, lesions or tenderness.  Extremities: without cyanosis, clubbing or edema. Cannot palpate distal pulses bilateral  SKin: Warm, no lesions or rashes   Musculoskeletal: No deformities  Neuro: no focal signs  BP 200/82  Wt 151 lb (68.493 kg)    EKG: Sinus bradycardia 50 beats per minute

## 2014-04-20 NOTE — Assessment & Plan Note (Signed)
To followup with Dr.McAlhany in October 2015

## 2014-04-20 NOTE — Addendum Note (Signed)
Addended by: Emilio Aspen A on: 04/20/2014 01:09 PM   Modules accepted: Orders

## 2014-04-20 NOTE — Patient Instructions (Addendum)
Your physician recommends that you schedule a follow-up appointment in: WITH DR. [name] IN 1 MONTH  Your physician recommends that you schedule a follow-up appointment in: [place] ([place])  Your physician recommends that you return for lab work in: BMET NEXT [place] have been (referred to [facility]) to have a nurse come out to help you with your blood pressure and medications  Your physician has recommended you make the following change in your medication:   STOP AMLODIPINE-BENAZEPRIL   START HYDRALAZINE 10 MG THREE TIMES A DAY  2 Gram Low Sodium Diet A 2 gram sodium diet restricts the amount of sodium in the diet to no more than 2 g or 2000 mg daily. Limiting the amount of sodium is often used to help lower blood pressure. It is important if you have heart, liver, or kidney problems. Many foods contain sodium for flavor and sometimes as a preservative. When the amount of sodium in a diet needs to be low, it is important to know what to look for when choosing foods and drinks. The following includes some information and guidelines to help make it easier for you to adapt to a low sodium diet. QUICK TIPS  Do not add salt to food.  Avoid convenience items and fast food.  Choose unsalted snack foods.  Buy lower sodium products, often labeled as "lower sodium" or "no salt added."  Check food labels to learn how much sodium is in 1 serving.  When eating at a restaurant, ask that your food be prepared with less salt or none, if possible. READING FOOD LABELS FOR SODIUM INFORMATION The nutrition facts label is a good place to find how much sodium is in foods. Look for products with no more than 500 to 600 mg of sodium per meal and no more than 150 mg per serving. Remember that 2 g = 2000 mg. The food label may also list foods as:  Sodium-free: Less than 5 mg in a serving.  Very low sodium: 35 mg or less in a serving.  Low-sodium:  140 mg or less in a serving.  Light in sodium: 50% less sodium in a serving. For example, if a food that usually has 300 mg of sodium is changed to become light in sodium, it will have 150 mg of sodium.  Reduced sodium: 25% less sodium in a serving. For example, if a food that usually has 400 mg of sodium is changed to reduced sodium, it will have 300 mg of sodium. CHOOSING FOODS Grains  Avoid: Salted crackers and snack items. Some cereals, including instant hot cereals. Bread stuffing and biscuit mixes. Seasoned rice or pasta mixes.  Choose: Unsalted snack items. Low-sodium cereals, oats, puffed wheat and rice, shredded wheat. English muffins and bread. Pasta. Meats  Avoid: Salted, canned, smoked, spiced, pickled meats, including fish and poultry. Bacon, ham, sausage, cold cuts, hot dogs, anchovies.  Choose: Low-sodium canned tuna and salmon. Fresh or frozen meat, poultry, and fish. Dairy  Avoid: Processed cheese and spreads. Cottage cheese. Buttermilk and condensed milk. Regular cheese.  Choose: Milk. Low-sodium cottage cheese. Yogurt. Sour cream. Low-sodium cheese. Fruits and Vegetables  Avoid: Regular canned vegetables. Regular canned tomato sauce and paste. Frozen vegetables in sauces. Olives. Angie Fava. Relishes. Sauerkraut.  Choose: Low-sodium canned vegetables. Low-sodium tomato sauce and paste. Frozen or fresh vegetables. Fresh and frozen fruit. Condiments  Avoid: Canned and packaged gravies. Worcestershire sauce. Tartar sauce. Barbecue sauce. Soy sauce.  Steak sauce. Ketchup. Onion, garlic, and table salt. Meat flavorings and tenderizers.  Choose: Fresh and dried herbs and spices. Low-sodium varieties of mustard and ketchup. Lemon juice. Tabasco sauce. Horseradish. SAMPLE 2 GRAM SODIUM MEAL PLAN Breakfast / Sodium (mg)  1 cup low-fat milk / [id number] mg  2 slices whole-wheat toast / 270 mg  1 tbs heart-healthy margarine / 153 mg  1 hard-boiled egg / 139 mg  1 small orange /  0 mg Lunch / Sodium (mg)  1 cup raw carrots / 76 mg   cup hummus / 298 mg  1 cup low-fat milk / 143 mg   cup red grapes / 2 mg  1 whole-wheat pita bread / 356 mg Dinner / Sodium (mg)  1 cup whole-wheat pasta / 2 mg  1 cup low-sodium tomato sauce / 73 mg  3 oz lean ground beef / 57 mg  1 small side salad (1 cup raw spinach leaves,  cup cucumber,  cup yellow bell pepper) with 1 tsp olive oil and 1 tsp red wine vinegar / 25 mg Snack / Sodium (mg)  1 container low-fat vanilla yogurt / 107 mg  3 graham cracker squares / 127 mg Nutrient Analysis  Calories: 2033  Protein: 77 g  Carbohydrate: 282 g  Fat: 72 g  Sodium: 1971 mg Document Released: 12/09/2005 Document Revised: 03/02/2012 Document Reviewed: 03/12/2010 [employer] Patient Information 2014 [place].

## 2014-04-21 ENCOUNTER — Ambulatory Visit: Payer: Medicare Other | Admitting: Cardiology

## 2014-04-22 ENCOUNTER — Telehealth: Payer: Self-pay

## 2014-04-22 NOTE — Telephone Encounter (Signed)
Patient's daughter [name] called no answer.Left message on personal voice mail call me back to schedule mother's appointment with Dr.Jordan in 1 month.

## 2014-04-29 ENCOUNTER — Other Ambulatory Visit (INDEPENDENT_AMBULATORY_CARE_PROVIDER_SITE_OTHER): Payer: Medicare HMO

## 2014-04-29 ENCOUNTER — Ambulatory Visit (INDEPENDENT_AMBULATORY_CARE_PROVIDER_SITE_OTHER): Payer: Medicare HMO

## 2014-04-29 VITALS — BP 132/74 | HR 62 | Ht 66.0 in | Wt 151.8 lb

## 2014-04-29 DIAGNOSIS — I10 Essential (primary) hypertension: Secondary | ICD-10-CM

## 2014-04-29 DIAGNOSIS — E785 Hyperlipidemia, unspecified: Secondary | ICD-10-CM

## 2014-04-29 DIAGNOSIS — I471 Supraventricular tachycardia: Secondary | ICD-10-CM

## 2014-04-29 LAB — BASIC METABOLIC PANEL
BUN: 17 mg/dL (ref 6–23)
CO2: 29 meq/L (ref 19–32)
CREATININE: 1.1 mg/dL (ref 0.4–1.2)
Calcium: 9.2 mg/dL (ref 8.4–10.5)
Chloride: 102 mEq/L (ref 96–112)
GFR: 52.65 mL/min — AB (ref >60.00)
GLUCOSE: 75 mg/dL (ref 70–99)
Potassium: 4.5 mEq/L (ref 3.5–5.1)
SODIUM: 136 meq/L (ref 135–145)

## 2014-04-29 NOTE — Progress Notes (Signed)
1.) Reason for visit: B/P check  2.) Name of MD requesting visit: Margarite Gouge PA  3.) H&P: Amlodipine/Benazepril stopped,Hydralazine 10 mg three times a day started.  4.) ROS related to problem: No complaints.Medications reviewed taking as prescribed.  5.) Assessment and plan per MD:

## 2014-04-29 NOTE — Patient Instructions (Signed)
Continue same medications   Keep appointments as planned

## 2014-05-02 ENCOUNTER — Telehealth: Payer: Self-pay | Admitting: Cardiology

## 2014-05-02 NOTE — Telephone Encounter (Signed)
Returned call, was on hold with relay service briefly. Hung up and left message on daughter Rose's mobile number to return call.

## 2014-05-02 NOTE — Telephone Encounter (Signed)
New message  ° ° °Patient calling for test results.   °

## 2014-05-09 NOTE — Progress Notes (Signed)
Quick Note:  Patient notified of lab results from 04/29/14. Agreed to continue with current treatment plan. No questions or concerns. ______

## 2014-05-10 ENCOUNTER — Other Ambulatory Visit: Payer: Self-pay | Admitting: Cardiovascular Disease

## 2014-05-13 ENCOUNTER — Telehealth: Payer: Self-pay | Admitting: Cardiology

## 2014-05-13 NOTE — Telephone Encounter (Signed)
Returned call to patient's daughter [name] advised ok to use magic bullet for healthy shakes

## 2014-05-13 NOTE — Telephone Encounter (Signed)
New message     Pt has a magic bullet. It says contact your physician if you have high blood pressure or high cholesterol.  Daughter did not know why she would need to do this, but she is contacting the doctor to make sure it is safe.  Daughter will make her mom healthy smoothies

## 2014-05-26 ENCOUNTER — Ambulatory Visit: Payer: Commercial Managed Care - HMO | Admitting: Cardiology

## 2014-06-12 ENCOUNTER — Other Ambulatory Visit: Payer: Self-pay | Admitting: Cardiology

## 2014-06-27 ENCOUNTER — Encounter: Payer: Self-pay | Admitting: Cardiology

## 2014-06-27 ENCOUNTER — Ambulatory Visit (INDEPENDENT_AMBULATORY_CARE_PROVIDER_SITE_OTHER): Payer: Medicare HMO | Admitting: Cardiology

## 2014-06-27 VITALS — BP 198/80 | HR 58 | Ht 66.0 in | Wt 153.0 lb

## 2014-06-27 DIAGNOSIS — E7800 Pure hypercholesterolemia, unspecified: Secondary | ICD-10-CM

## 2014-06-27 DIAGNOSIS — I471 Supraventricular tachycardia: Secondary | ICD-10-CM

## 2014-06-27 DIAGNOSIS — I739 Peripheral vascular disease, unspecified: Secondary | ICD-10-CM

## 2014-06-27 DIAGNOSIS — I10 Essential (primary) hypertension: Secondary | ICD-10-CM

## 2014-06-27 MED ORDER — HYDRALAZINE HCL 10 MG PO TABS: 20.0000 mg | ORAL_TABLET | Freq: Three times a day (TID) | ORAL | Status: DC

## 2014-06-27 NOTE — Patient Instructions (Signed)
Increase Hydralazine to 20 mg three times a day. ( take 2 of your 10 mg tablets). Call me if you notice any significant side effects  Continue to monitor your blood pressure.  I will see you in 6 months.

## 2014-06-28 NOTE — Progress Notes (Signed)
Stanton Kidney Date of Birth: [birth year] Medical Record [id number]  History of Present Illness: Ms. Veronica Young is seen back today for a follow up visit. She is seen with a sign language interpreter. She has HLD, glaucoma, hypothyroidism, PVD, labile HTN, DM and she is deaf. She has diastolic heart failure (grade 2 by echo). Last Myoview from On follow up today reports she is feeling well. Has occasional claudication but it doesn't really limit her. Did not bring BP monitor today but states readings have been high. No dizziness or chest pain. Denies SOB.   Current Outpatient Prescriptions  Medication Sig Dispense Refill  . acetaZOLAMIDE (DIAMOX) 250 MG tablet 250 mg.      . aspirin EC 81 MG tablet Take 81 mg by mouth daily.      [name] atorvastatin (LIPITOR) 80 MG tablet Take 80 mg by mouth daily.      [name] atropine 1 % ophthalmic solution       . brimonidine (ALPHAGAN P) 0.1 % SOLN       . Calcium Carbonate-Vitamin D (CALCIUM 600 + D PO) Take 1 tablet by mouth daily.       . Calcium-Vitamin D 600-200 MG-UNIT per tablet Take 1 tablet by mouth.      . Cholecalciferol (VITAMIN D PO) Take 1 tablet by mouth daily.      . Cholecalciferol (VITAMIN D3) 1000 UNITS CAPS Take 1,000 Units by mouth.      . cilostazol (PLETAL) 50 MG tablet TAKE 1 TABLET BY MOUTH 2 TIMES DAILY.  60 tablet  6  . citalopram (CELEXA) 20 MG tablet Take 20 mg by mouth daily.       . dorzolamide-timolol (COSOPT) 22.3-6.8 MG/ML ophthalmic solution 1 drop 2 (two) times daily.      [name] latanoprost (XALATAN) 0.005 % ophthalmic solution Place 1 drop into both eyes at bedtime.       [name] levothyroxine (SYNTHROID, LEVOTHROID) 112 MCG tablet Take 112 mcg by mouth.      [name] lisinopril (PRINIVIL,ZESTRIL) 40 MG tablet Take 1 tablet (40 mg total) by mouth daily.  90 tablet  3  . metoprolol succinate (TOPROL-XL) 25 MG 24 hr tablet TAKE 1 TABLET BY MOUTH EVERY DAY  90 tablet  0  . pilocarpine (PILOCAR) 1 % ophthalmic solution 1 drop as directed.      .  potassium chloride (KLOR-CON 10) 10 MEQ tablet TAKE 1 TABLET BY MOUTH EVERY DAY      . prednisoLONE acetate (PRED FORTE) 1 % ophthalmic suspension 1 drop as directed.      . hydrALAZINE (APRESOLINE) 10 MG tablet Take 2 tablets (20 mg total) by mouth 3 (three) times daily.  180 tablet  11   No current facility-administered medications for this visit.    Allergies  Allergen Reactions  . Amlodipine   . Benicar [Olmesartan Medoxomil]   . Hydralazine Nausea Only    "made me sick" "made me sick"    Past Medical History  Diagnosis Date  . Hypertension   . Diabetes mellitus     type 2  . Hypercholesterolemia   . Deafness   . Carotid bruit   . GERD (gastroesophageal reflux disease)   . Glaucoma   . Hypothyroidism   . PAT (paroxysmal atrial tachycardia)   . PAD (peripheral artery disease)   . Diastolic heart failure     EF is 60%  . Normal cardiac stress test January 2014    Past Surgical History  Procedure Laterality  Date  . Cholycystectomy    . Total knee arthroplasty      right  . Left breast cyst removed      History  Smoking status  . Never Smoker   Smokeless tobacco  . Never Used    History  Alcohol Use No    Family History  Problem Relation Age of Onset  . Heart disease Mother   . Liver cancer Father   . Heart disease Sister   . Heart disease Brother   . Stroke Sister     Review of Systems: The review of systems is per the HPI.  All other systems were reviewed and are negative.  Physical Exam: BP 198/80  Pulse 58  Ht 5\' 6"  (1.676 m)  Wt 153 lb (69.4 kg)  BMI 24.71 kg/m2 Patient is very pleasant and in no acute distress. Skin is warm and dry. Color is normal.  HEENT is unremarkable. Normocephalic/atraumatic. PERRL. Sclera are nonicteric. Neck is supple. No masses. No JVD. Lungs are clear. Cardiac exam shows a regular rate and rhythm. Abdomen is soft. Extremities are without edema. Gait and ROM are intact. No gross neurologic deficits noted.  Wt  Readings from Last 3 Encounters:  06/27/14 153 lb (69.4 kg)  04/29/14 151 lb 12 oz (68.833 kg)  04/20/14 151 lb (68.493 kg)     LABORATORY DATA: PENDING   Lab Results  Component Value Date   WBC 5.8 11/27/2012   HGB 13.2 11/27/2012   HCT 38.4 11/27/2012   PLT 207 11/27/2012   GLUCOSE 75 04/29/2014   ALT 18 11/22/2013   AST 21 11/22/2013   NA 136 04/29/2014   K 4.5 04/29/2014   CL 102 04/29/2014   CREATININE 1.1 04/29/2014   BUN 17 04/29/2014   CO2 29 04/29/2014   TSH 1.389 07/05/2011   INR 1.91* 07/11/2011   HGBA1C 6.3 11/22/2013     Assessment / Plan: 1. HTN - control is still poor. I have recommended increasing hydralazine to 20 mg tid. Previously she felt bad and had orthostatic symptoms with higher doses but this may have been increased too quickly. Keep diary and bring with next visit. Avoid sodium.   2. Diastolic HF - her weight is stable - looks compensated to me. Using Lasix just prn.   3. PVD - followed by Dr. Angelena Form

## 2014-07-12 ENCOUNTER — Telehealth: Payer: Self-pay | Admitting: Cardiology

## 2014-07-12 ENCOUNTER — Ambulatory Visit: Payer: Medicare HMO | Admitting: Physician Assistant

## 2014-07-12 NOTE — Telephone Encounter (Signed)
[name] is calling because she is feeling dizzy ,tired and weak . Not sure if its her heart or something else. Had a little SOB last evening..  Became dizzy twice while talking to me(Billlie)... Please call..    Thanks

## 2014-07-12 NOTE — Telephone Encounter (Signed)
Returning your call.Please call asap,dizzy and blood pressure is up.,

## 2014-07-12 NOTE — Telephone Encounter (Signed)
Returned call to patient she stated she has been feeling funny.Stated she is weak,dizzy.No chest pain.Stated chest feels hot. Stated symptoms started yesterday. B/P 164/70,145/70 pulse 48.Appointment offered with Richardson Dopp PA for Friday 07/15/14.Patient stated she would like to be seen sooner.Spoke to daughter Veronica Young she stated patient will take appointment with Richardson Dopp PA for Friday 07/15/14 at 9:10 am.Advised to go to ER if needed.

## 2014-07-13 NOTE — Telephone Encounter (Signed)
Spoke to daughter [name] she stated her mother was feeling better.Stated she will call me back tomorrow and let me know if she will keep appointment with Richardson Dopp PA on Friday 07/15/14.

## 2014-07-15 ENCOUNTER — Ambulatory Visit: Payer: Medicare HMO | Admitting: Physician Assistant

## 2014-07-25 ENCOUNTER — Emergency Department (HOSPITAL_COMMUNITY): Payer: Medicare HMO

## 2014-07-25 ENCOUNTER — Encounter (HOSPITAL_COMMUNITY): Payer: Self-pay | Admitting: Emergency Medicine

## 2014-07-25 ENCOUNTER — Inpatient Hospital Stay (HOSPITAL_COMMUNITY)
Admission: EM | Admit: 2014-07-25 | Discharge: 2014-07-27 | DRG: 243 | Disposition: A | Discharge status: Home / Self Care | Payer: Medicare HMO | Attending: Cardiology | Admitting: Cardiology

## 2014-07-25 DIAGNOSIS — R42 Dizziness and giddiness: Secondary | ICD-10-CM | POA: Diagnosis present

## 2014-07-25 DIAGNOSIS — E119 Type 2 diabetes mellitus without complications: Secondary | ICD-10-CM | POA: Diagnosis present

## 2014-07-25 DIAGNOSIS — E039 Hypothyroidism, unspecified: Secondary | ICD-10-CM | POA: Diagnosis present

## 2014-07-25 DIAGNOSIS — R0600 Dyspnea, unspecified: Secondary | ICD-10-CM | POA: Diagnosis present

## 2014-07-25 DIAGNOSIS — Z823 Family history of stroke: Secondary | ICD-10-CM

## 2014-07-25 DIAGNOSIS — I495 Sick sinus syndrome: Secondary | ICD-10-CM | POA: Diagnosis not present

## 2014-07-25 DIAGNOSIS — Z888 Allergy status to other drugs, medicaments and biological substances status: Secondary | ICD-10-CM

## 2014-07-25 DIAGNOSIS — I739 Peripheral vascular disease, unspecified: Secondary | ICD-10-CM | POA: Diagnosis present

## 2014-07-25 DIAGNOSIS — Z7982 Long term (current) use of aspirin: Secondary | ICD-10-CM

## 2014-07-25 DIAGNOSIS — I5032 Chronic diastolic (congestive) heart failure: Secondary | ICD-10-CM | POA: Diagnosis present

## 2014-07-25 DIAGNOSIS — Z800 Family history of malignant neoplasm of digestive organs: Secondary | ICD-10-CM

## 2014-07-25 DIAGNOSIS — I10 Essential (primary) hypertension: Secondary | ICD-10-CM | POA: Diagnosis present

## 2014-07-25 DIAGNOSIS — K219 Gastro-esophageal reflux disease without esophagitis: Secondary | ICD-10-CM | POA: Diagnosis present

## 2014-07-25 DIAGNOSIS — I509 Heart failure, unspecified: Secondary | ICD-10-CM | POA: Diagnosis present

## 2014-07-25 DIAGNOSIS — H409 Unspecified glaucoma: Secondary | ICD-10-CM | POA: Diagnosis present

## 2014-07-25 DIAGNOSIS — Z96659 Presence of unspecified artificial knee joint: Secondary | ICD-10-CM

## 2014-07-25 DIAGNOSIS — H9190 Unspecified hearing loss, unspecified ear: Secondary | ICD-10-CM | POA: Diagnosis present

## 2014-07-25 DIAGNOSIS — R001 Bradycardia, unspecified: Secondary | ICD-10-CM

## 2014-07-25 DIAGNOSIS — Z8249 Family history of ischemic heart disease and other diseases of the circulatory system: Secondary | ICD-10-CM

## 2014-07-25 DIAGNOSIS — Z79899 Other long term (current) drug therapy: Secondary | ICD-10-CM

## 2014-07-25 DIAGNOSIS — E7800 Pure hypercholesterolemia, unspecified: Secondary | ICD-10-CM | POA: Diagnosis present

## 2014-07-25 HISTORY — DX: Sick sinus syndrome: I49.5

## 2014-07-25 LAB — CBC
HCT: 38 % (ref 36.0–46.0)
HEMOGLOBIN: 12.5 g/dL (ref 12.0–15.0)
MCH: 27.4 pg (ref 26.0–34.0)
MCHC: 32.9 g/dL (ref 30.0–36.0)
MCV: 83.2 fL (ref 78.0–100.0)
Platelets: 191 10*3/uL (ref 150–400)
RBC: 4.57 MIL/uL (ref 3.87–5.11)
RDW: 16.1 % — ABNORMAL HIGH (ref 11.5–15.5)
WBC: 6.6 10*3/uL (ref 4.0–10.5)

## 2014-07-25 LAB — BASIC METABOLIC PANEL
ANION GAP: 12 (ref 5–15)
BUN: 24 mg/dL — ABNORMAL HIGH (ref 6–23)
CHLORIDE: 101 meq/L (ref 96–112)
CO2: 22 mEq/L (ref 19–32)
Calcium: 9.4 mg/dL (ref 8.4–10.5)
Creatinine, Ser: 1.12 mg/dL — ABNORMAL HIGH (ref 0.50–1.10)
GFR calc non Af Amer: 46 mL/min — ABNORMAL LOW (ref >90)
GFR, EST AFRICAN AMERICAN: 53 mL/min — AB (ref >90)
Glucose, Bld: 93 mg/dL (ref 70–99)
POTASSIUM: 4.6 meq/L (ref 3.7–5.3)
Sodium: 135 mEq/L — ABNORMAL LOW (ref 137–147)

## 2014-07-25 LAB — I-STAT TROPONIN, ED: TROPONIN I, POC: 0 ng/mL (ref 0.00–0.08)

## 2014-07-25 LAB — PRO B NATRIURETIC PEPTIDE: Pro B Natriuretic peptide (BNP): 578.9 pg/mL — ABNORMAL HIGH (ref 0–450)

## 2014-07-25 MED ORDER — CITALOPRAM HYDROBROMIDE 20 MG PO TABS
20.0000 mg | ORAL_TABLET | Freq: Every day | ORAL | Status: DC
  Administered 2014-07-25 – 2014-07-27 (×3): 20 mg via ORAL
  Filled 2014-07-25 (×3): qty 1

## 2014-07-25 MED ORDER — LEVOTHYROXINE SODIUM 112 MCG PO TABS
112.0000 ug | ORAL_TABLET | Freq: Every day | ORAL | Status: DC
  Administered 2014-07-26 – 2014-07-27 (×2): 112 ug via ORAL
  Filled 2014-07-25 (×3): qty 1

## 2014-07-25 MED ORDER — LISINOPRIL 40 MG PO TABS
40.0000 mg | ORAL_TABLET | Freq: Every day | ORAL | Status: DC
  Administered 2014-07-26 – 2014-07-27 (×2): 40 mg via ORAL
  Filled 2014-07-25 (×2): qty 1

## 2014-07-25 MED ORDER — ASPIRIN EC 81 MG PO TBEC
81.0000 mg | DELAYED_RELEASE_TABLET | Freq: Every evening | ORAL | Status: DC
  Administered 2014-07-25: 81 mg via ORAL
  Filled 2014-07-25 (×3): qty 1

## 2014-07-25 MED ORDER — AMLODIPINE BESYLATE 5 MG PO TABS
5.0000 mg | ORAL_TABLET | Freq: Every day | ORAL | Status: DC
  Administered 2014-07-25 – 2014-07-27 (×2): 5 mg via ORAL
  Filled 2014-07-25 (×3): qty 1

## 2014-07-25 MED ORDER — HYDRALAZINE HCL 25 MG PO TABS
25.0000 mg | ORAL_TABLET | Freq: Three times a day (TID) | ORAL | Status: DC
  Administered 2014-07-25 – 2014-07-27 (×5): 25 mg via ORAL
  Filled 2014-07-25 (×8): qty 1

## 2014-07-25 MED ORDER — ATROPINE SULFATE 1 % OP SOLN
1.0000 [drp] | Freq: Two times a day (BID) | OPHTHALMIC | Status: DC
  Administered 2014-07-25 – 2014-07-27 (×4): 1 [drp] via OPHTHALMIC
  Filled 2014-07-25: qty 2

## 2014-07-25 MED ORDER — DORZOLAMIDE HCL-TIMOLOL MAL 2-0.5 % OP SOLN
1.0000 [drp] | Freq: Two times a day (BID) | OPHTHALMIC | Status: DC
Start: 2014-07-25 — End: 2014-07-27
  Administered 2014-07-25 – 2014-07-27 (×4): 1 [drp] via OPHTHALMIC
  Filled 2014-07-25: qty 10

## 2014-07-25 MED ORDER — POTASSIUM CHLORIDE ER 10 MEQ PO TBCR
10.0000 meq | EXTENDED_RELEASE_TABLET | Freq: Every day | ORAL | Status: DC
  Administered 2014-07-25 – 2014-07-27 (×3): 10 meq via ORAL
  Filled 2014-07-25 (×3): qty 1

## 2014-07-25 MED ORDER — ATORVASTATIN CALCIUM 80 MG PO TABS
80.0000 mg | ORAL_TABLET | Freq: Every day | ORAL | Status: DC
  Administered 2014-07-25 – 2014-07-26 (×2): 80 mg via ORAL
  Filled 2014-07-25 (×3): qty 1

## 2014-07-25 MED ORDER — CILOSTAZOL 50 MG PO TABS
50.0000 mg | ORAL_TABLET | Freq: Two times a day (BID) | ORAL | Status: DC
  Administered 2014-07-25 – 2014-07-27 (×4): 50 mg via ORAL
  Filled 2014-07-25 (×5): qty 1

## 2014-07-25 MED ORDER — SODIUM CHLORIDE 0.9 % IJ SOLN
3.0000 mL | Freq: Two times a day (BID) | INTRAMUSCULAR | Status: DC
  Administered 2014-07-25 – 2014-07-26 (×2): 3 mL via INTRAVENOUS

## 2014-07-25 NOTE — ED Notes (Signed)
Pt c/o dizziness x 1 week and SOB that started this morning. Denies pain or n/v.

## 2014-07-25 NOTE — H&P (Signed)
Cardiology:  Veronica Young is an 80 y.o. female.   Chief Complaint: SOB/Dizziness HPI:   The patient is a 80 yo female with a hisory of uncontrolled HTN, DM2, deafness, PAD, PAT, hypothyroidism, chronic diastolic CHF.  Herlast echocardiogram was 11/2012 and her EF was 60% with mild LVH, grade 2 DD.  Hse had a normal nuclear stress test in 03/2009.  She has bilateral popliteal artery occlusions. Her last LE ABIs were 10/2013:  R 0.41, L 0.46. These were stable.   She presents today with SOB and dizziness.   EKG reveals a HR of 39 and no acute ischemic changes.  She reports off and on dizziness for a couple of weeks.  The dyspnea started today.  She feels better currently.  The dizziness occurs regardless of position and not necessarily with change in position.  The patient currently denies nausea, vomiting, fever, chest pain, orthopnea, PND, cough, congestion, abdominal pain, hematochezia, melena, lower extremity edema, claudication.    Prior to Admission medications   Medication Sig Start Date End Date Taking? Authorizing Provider  aspirin EC 81 MG tablet Take 81 mg by mouth every evening.    Yes Historical Provider, MD  atorvastatin (LIPITOR) 80 MG tablet Take 80 mg by mouth daily.   Yes Historical Provider, MD  atropine 1 % ophthalmic solution Place 1 drop into the left eye 2 (two) times daily.    Yes Historical Provider, MD  calcium-vitamin D (OSCAL WITH D) 500-200 MG-UNIT per tablet Take 1 tablet by mouth daily with breakfast.   Yes Historical Provider, MD  cholecalciferol (VITAMIN D) 1000 UNITS tablet Take 1,000 Units by mouth daily.   Yes Historical Provider, MD  cilostazol (PLETAL) 50 MG tablet Take 50 mg by mouth 2 (two) times daily.   Yes Historical Provider, MD  citalopram (CELEXA) 20 MG tablet Take 20 mg by mouth daily.  02/08/11  Yes Historical Provider, MD  dorzolamide-timolol (COSOPT) 22.3-6.8 MG/ML ophthalmic solution Place 1 drop into both eyes 2 (two) times daily.    Yes  Historical Provider, MD  hydrALAZINE (APRESOLINE) 10 MG tablet Take 10 mg by mouth 3 (three) times daily.   Yes Historical Provider, MD  levothyroxine (SYNTHROID, LEVOTHROID) 112 MCG tablet Take 112 mcg by mouth daily before breakfast.  03/31/14  Yes Historical Provider, MD  lisinopril (PRINIVIL,ZESTRIL) 40 MG tablet Take 1 tablet (40 mg total) by mouth daily. 02/16/14  Yes Doreene Burke Kilroy, PA-C  metoprolol succinate (TOPROL-XL) 25 MG 24 hr tablet Take 25 mg by mouth daily.   Yes Historical Provider, MD  potassium chloride (K-DUR) 10 MEQ tablet Take 10 mEq by mouth daily.   Yes Historical Provider, MD     Past Medical History  Diagnosis Date  . Hypertension   . Diabetes mellitus     type 2  . Hypercholesterolemia   . Deafness   . Carotid bruit   . GERD (gastroesophageal reflux disease)   . Glaucoma   . Hypothyroidism   . PAT (paroxysmal atrial tachycardia)   . PAD (peripheral artery disease)   . Diastolic heart failure     EF is 60%  . Normal cardiac stress test January 2014    Past Surgical History  Procedure Laterality Date  . Cholycystectomy    . Total knee arthroplasty      right  . Left breast cyst removed      Family History  Problem Relation Age of Onset  . Heart disease Mother   .  Liver cancer Father   . Heart disease Sister   . Heart disease Brother   . Stroke Sister    Social History:  reports that she has never smoked. She has never used smokeless tobacco. She reports that she does not drink alcohol or use illicit drugs.  Allergies:  Allergies  Allergen Reactions  . Amlodipine Other (See Comments)    unknown  . Benicar [Olmesartan Medoxomil] Other (See Comments)    Reaction unknown     (Not in a hospital admission)  Results for orders placed during the hospital encounter of 07/25/14 (from the past 48 hour(s))  I-STAT TROPOININ, ED     Status: None   Collection Time    07/25/14 10:53 AM      Result Value Ref Range   Troponin i, poc 0.00  0.00 - 0.08  ng/mL   Comment 3            Comment: Due to the release kinetics of cTnI,     a negative result within the first hours     of the onset of symptoms does not rule out     myocardial infarction with certainty.     If myocardial infarction is still suspected,     repeat the test at appropriate intervals.  PRO B NATRIURETIC PEPTIDE     Status: Abnormal   Collection Time    07/25/14 10:57 AM      Result Value Ref Range   Pro B Natriuretic peptide (BNP) 578.9 (*) 0 - 450 pg/mL  BASIC METABOLIC PANEL     Status: Abnormal   Collection Time    07/25/14 10:59 AM      Result Value Ref Range   Sodium 135 (*) 137 - 147 mEq/L   Potassium 4.6  3.7 - 5.3 mEq/L   Chloride 101  96 - 112 mEq/L   CO2 22  19 - 32 mEq/L   Glucose, Bld 93  70 - 99 mg/dL   BUN 24 (*) 6 - 23 mg/dL   Creatinine, Ser 1.12 (*) 0.50 - 1.10 mg/dL   Calcium 9.4  8.4 - 10.5 mg/dL   GFR calc non Af Amer 46 (*) >90 mL/min   GFR calc Af Amer 53 (*) >90 mL/min   Comment: (NOTE)     The eGFR has been calculated using the CKD EPI equation.     This calculation has not been validated in all clinical situations.     eGFR's persistently <90 mL/min signify possible Chronic Kidney     Disease.   Anion gap 12  5 - 15  CBC     Status: Abnormal   Collection Time    07/25/14 10:59 AM      Result Value Ref Range   WBC 6.6  4.0 - 10.5 K/uL   RBC 4.57  3.87 - 5.11 MIL/uL   Hemoglobin 12.5  12.0 - 15.0 g/dL   HCT 38.0  36.0 - 46.0 %   MCV 83.2  78.0 - 100.0 fL   MCH 27.4  26.0 - 34.0 pg   MCHC 32.9  30.0 - 36.0 g/dL   RDW 16.1 (*) 11.5 - 15.5 %   Platelets 191  150 - 400 K/uL   Dg Chest Portable 1 View  07/25/2014   CLINICAL DATA:  Shortness of breath, dizziness  EXAM: PORTABLE CHEST - 1 VIEW  COMPARISON:  11/27/2012  FINDINGS: Mild cardiomegaly. No overt edema. No confluent airspace opacities or effusions. No acute bony abnormality.  IMPRESSION: No active disease.   Electronically Signed   By: Rolm Baptise M.D.   On: 07/25/2014 10:49     Review of Systems  All other systems reviewed and are negative. The patient currently denies nausea, vomiting, fever, chest pain, orthopnea, PND, cough, congestion, abdominal pain, hematochezia, melena, lower extremity edema, claudication.   Blood pressure 180/66, pulse 56, temperature 98.5 F (36.9 C), temperature source Oral, resp. rate 18, SpO2 92.00%. Physical Exam  Nursing note and vitals reviewed. Constitutional: She is oriented to person, place, and time. She appears well-developed and well-nourished.  HENT:  Head: Normocephalic and atraumatic.  Eyes: EOM are normal. No scleral icterus.  Right pupil round and reactive to light.  The left is irreg and not reactive.    Neck: Normal range of motion. Neck supple. No JVD present.  Cardiovascular: Regular rhythm, S1 normal and S2 normal.  Bradycardia present.   No murmur heard. Pulses:      Radial pulses are 1+ on the right side, and 2+ on the left side.       Dorsalis pedis pulses are 0 on the right side, and 0 on the left side.       Posterior tibial pulses are 0 on the right side, and 1+ on the left side.  Mild left carotid bruit.  Feet are warm.  Respiratory: Effort normal and breath sounds normal. She has no wheezes. She has no rales.  GI: Soft. Bowel sounds are normal. She exhibits no distension. There is no tenderness.  Musculoskeletal: She exhibits no edema.  Lymphadenopathy:    She has no cervical adenopathy.  Neurological: She is alert and oriented to person, place, and time.  Skin: Skin is warm and dry.  Psychiatric: She has a normal mood and affect.     Assessment/Plan Principal Problem:   Dizziness Active Problems:   DM2 (diabetes mellitus, type 2)   Hypercholesterolemia   Hypothyroidism   PAD (peripheral artery disease)   Chronic diastolic CHF (congestive heart failure)   Malignant hypertension   Acquired deafness   Dyspnea  Plan:  Admit for observation to telemetry.  Monitor for worsening  bradycardia or other arrhythmia to explain dizziness.   Stop Toprol.  Increase hydralazine to 2m TID.  Check metanephrines(OP), TSH.  Consider 15-30 day cardionet monitoring at DC.    HTarri Fuller PA-C 07/25/2014, 2:48 PM  [facility] PA-C.  She gives a history (through her interpreter) of dizziness and wide fluctuations of her systolic BP. In the ER today she has had marked sinus bradycardia in the 30s at times. She has been on Toprol XL 25 mg each night. She recently saw Dr. JMartiniquein the office and BP was poorly controlled at that time and her hydralazine was increased to 25 mg TID.  The patient is not presently taking a diuretic but has used lasix in the past for peripheral edema on a PRN basis. Chest xray reviewed by me shows cardiomegaly but no definite CHF. BNP is mildly elevated.  Denies chest pain. Has been dizzy but no syncope. Physical exam reveals clear lungs to auscultation. Heart no gallop. Extremities reveal absent pedal pulses. She appears to have possible sick sinus syndrome with intermittent marked bradycardia. This may be the cause of her dizziness. She may need a pacemaker. [facility]  Consider EP consult. We will hold her beta blocker and observe for further bradycardic spells.  She does not recall being allergic to amlodipine. It may be more of an intolerance (?edema?) than a true allergy.  While off BB we will try amlodipine and observe response. We will also try increasing hydralazine to 25 mg TID. Consider adding mild diuretic if BP remains high. She has significant PAD with essentially absent pedal pulses but is not having any rest pain.  If BP remains high consider renal artery duplex. She denies chest pain and had a normal lexiscan myoview in 2010.

## 2014-07-25 NOTE — ED Notes (Signed)
EKG given to Dr Tawnya Crook.Marland Kitchenklj

## 2014-07-25 NOTE — ED Provider Notes (Signed)
CSN: [id number]     Arrival date & time 07/25/14  1005 History   First MD Initiated Contact with Patient 07/25/14 1016     Chief Complaint  Patient presents with  . Shortness of Breath  . Dizziness     (Consider location/radiation/quality/duration/timing/severity/associated sxs/prior Treatment) Patient is a 80 y.o. female presenting with shortness of breath and dizziness. The history is provided by the patient. No language interpreter was used.  Shortness of Breath Severity:  Severe Onset quality:  Sudden Duration:  1 hour Timing:  Constant Progression:  Resolved Chronicity:  New Relieved by:  Nothing Worsened by:  Nothing tried Ineffective treatments:  None tried Associated symptoms: no abdominal pain, no chest pain, no claudication, no cough, no diaphoresis, no fever, no headaches, no neck pain, no sore throat, no sputum production, no syncope and no vomiting   Associated symptoms comment:  Dizziness  Dizziness Quality:  Lightheadedness Severity:  Moderate Onset quality:  Sudden Duration:  1 hour Timing:  Constant Progression:  Resolved Chronicity:  New Relieved by:  Nothing Worsened by:  Nothing tried Ineffective treatments:  None tried Associated symptoms: shortness of breath and weakness   Associated symptoms: no chest pain, no diarrhea, no headaches, no nausea, no palpitations, no syncope, no vision changes and no vomiting     Past Medical History  Diagnosis Date  . Hypertension   . Diabetes mellitus     type 2  . Hypercholesterolemia   . Deafness   . Carotid bruit   . GERD (gastroesophageal reflux disease)   . Glaucoma   . Hypothyroidism   . PAT (paroxysmal atrial tachycardia)   . PAD (peripheral artery disease)   . Diastolic heart failure     EF is 60%  . Normal cardiac stress test January 2014   Past Surgical History  Procedure Laterality Date  . Cholycystectomy    . Total knee arthroplasty      right  . Left breast cyst removed     Family  History  Problem Relation Age of Onset  . Heart disease Mother   . Liver cancer Father   . Heart disease Sister   . Heart disease Brother   . Stroke Sister    History  Substance Use Topics  . Smoking status: Never Smoker   . Smokeless tobacco: Never Used  . Alcohol Use: No   OB History   Grav Para Term Preterm Abortions TAB SAB Ect Mult Living                 Review of Systems  Constitutional: Negative for fever, chills, diaphoresis, activity change, appetite change and fatigue.  HENT: Negative for congestion, facial swelling, rhinorrhea and sore throat.   Eyes: Negative for photophobia and discharge.  Respiratory: Positive for shortness of breath. Negative for cough, sputum production and chest tightness.   Cardiovascular: Negative for chest pain, palpitations, claudication, leg swelling and syncope.  Gastrointestinal: Negative for nausea, vomiting, abdominal pain and diarrhea.  Endocrine: Negative for polydipsia and polyuria.  Genitourinary: Negative for dysuria, frequency, difficulty urinating and pelvic pain.  Musculoskeletal: Negative for arthralgias, back pain, neck pain and neck stiffness.  Skin: Negative for color change and wound.  Allergic/Immunologic: Negative for immunocompromised state.  Neurological: Positive for dizziness. Negative for facial asymmetry, weakness, numbness and headaches.  Hematological: Does not bruise/bleed easily.  Psychiatric/Behavioral: Negative for confusion and agitation.      Allergies  Amlodipine and Benicar  Home Medications   Prior to Admission medications  Medication Sig Start Date End Date Taking? Authorizing Provider  aspirin EC 81 MG tablet Take 81 mg by mouth every evening.    Yes Historical Provider, MD  atorvastatin (LIPITOR) 80 MG tablet Take 80 mg by mouth daily.   Yes Historical Provider, MD  atropine 1 % ophthalmic solution Place 1 drop into the left eye 2 (two) times daily.    Yes Historical Provider, MD   calcium-vitamin D (OSCAL WITH D) 500-200 MG-UNIT per tablet Take 1 tablet by mouth daily with breakfast.   Yes Historical Provider, MD  cholecalciferol (VITAMIN D) 1000 UNITS tablet Take 1,000 Units by mouth daily.   Yes Historical Provider, MD  cilostazol (PLETAL) 50 MG tablet Take 50 mg by mouth 2 (two) times daily.   Yes Historical Provider, MD  citalopram (CELEXA) 20 MG tablet Take 20 mg by mouth daily.  02/08/11  Yes Historical Provider, MD  dorzolamide-timolol (COSOPT) 22.3-6.8 MG/ML ophthalmic solution Place 1 drop into both eyes 2 (two) times daily.    Yes Historical Provider, MD  hydrALAZINE (APRESOLINE) 10 MG tablet Take 10 mg by mouth 3 (three) times daily.   Yes Historical Provider, MD  levothyroxine (SYNTHROID, LEVOTHROID) 112 MCG tablet Take 112 mcg by mouth daily before breakfast.  03/31/14  Yes Historical Provider, MD  lisinopril (PRINIVIL,ZESTRIL) 40 MG tablet Take 1 tablet (40 mg total) by mouth daily. 02/16/14  Yes Doreene Burke Kilroy, PA-C  metoprolol succinate (TOPROL-XL) 25 MG 24 hr tablet Take 25 mg by mouth daily.   Yes Historical Provider, MD  potassium chloride (K-DUR) 10 MEQ tablet Take 10 mEq by mouth daily.   Yes Historical Provider, MD   BP 146/53  Pulse 52  Temp(Src) 98 F (36.7 C) (Oral)  Resp 18  Ht 5\' 6"  (1.676 m)  Wt 145 lb 11.2 oz (66.089 kg)  BMI 23.53 kg/m2  SpO2 95% Physical Exam  Constitutional: She is oriented to person, place, and time. She appears well-developed and well-nourished. No distress.  HENT:  Head: Normocephalic and atraumatic.  Mouth/Throat: No oropharyngeal exudate.  Eyes: Pupils are equal, round, and reactive to light.  Neck: Normal range of motion. Neck supple.  Cardiovascular: Regular rhythm and normal heart sounds.  Bradycardia present.  Exam reveals no gallop and no friction rub.   No murmur heard. Pulmonary/Chest: Effort normal and breath sounds normal. No respiratory distress. She has no wheezes. She has no rales.  Abdominal: Soft.  Bowel sounds are normal. She exhibits no distension and no mass. There is no tenderness. There is no rebound and no guarding.  Musculoskeletal: Normal range of motion. She exhibits no edema and no tenderness.  Neurological: She is alert and oriented to person, place, and time.  Skin: Skin is warm and dry.  Psychiatric: She has a normal mood and affect.    ED Course  Procedures (including critical care time) Labs Review Labs Reviewed  PRO B NATRIURETIC PEPTIDE - Abnormal; Notable for the following:    Pro B Natriuretic peptide (BNP) 578.9 (*)    All other components within normal limits  BASIC METABOLIC PANEL - Abnormal; Notable for the following:    Sodium 135 (*)    BUN 24 (*)    Creatinine, Ser 1.12 (*)    GFR calc non Af Amer 46 (*)    GFR calc Af Amer 53 (*)    All other components within normal limits  CBC - Abnormal; Notable for the following:    RDW 16.1 (*)  All other components within normal limits  TSH  I-STAT TROPOININ, ED    Imaging Review Dg Chest Portable 1 View  07/25/2014   CLINICAL DATA:  Shortness of breath, dizziness  EXAM: PORTABLE CHEST - 1 VIEW  COMPARISON:  11/27/2012  FINDINGS: Mild cardiomegaly. No overt edema. No confluent airspace opacities or effusions. No acute bony abnormality.  IMPRESSION: No active disease.   Electronically Signed   By: Rolm Baptise M.D.   On: 07/25/2014 10:49     EKG Interpretation   Date/Time:  Monday July 25 2014 10:17:14 EDT Ventricular Rate:  39 PR Interval:  160 QRS Duration: 98 QT Interval:  475 QTC Calculation: 382 R Axis:   68 Text Interpretation:  Sinus bradycardia Probable left ventricular  hypertrophy TWI in lateral and interior leads now resolved Confirmed by  DOCHERTY  MD, [place] ([id number]) on 07/25/2014 11:37:09 AM      MDM   Final diagnoses:  Dizziness  Bradycardia  Essential hypertension    Pt is a 80 y.o. female with Pmhx as above who presents with dizziness and SOB starting around 9am this morning  at rest, lasting until she got here. She denies numbness, weakness, confusion, h/a, chest pain.  She now feels improved, is not SOB or dizzy. On PE, her BP is very labile, initially  249/83, then 150's/80's short time later. Her HR is also very labile, going from 30's -60's and appears sinus. She was not symptomatic from HR 30's while sitting in the bed, though I have concern that this could be cause of dizziness. CXR, nml. Trop not elevated, BNP improved from prior. CBC, BMP noncontributory.  Spoke w/ Dr. Mare Ferrari w/ cardiology who is concerned for sick sinus syndrome and will transfer to [facility] for admission        Neta Ehlers, MD 07/26/14 1251

## 2014-07-26 ENCOUNTER — Encounter (HOSPITAL_COMMUNITY)
Admission: EM | Disposition: A | Discharge status: Home / Self Care | Payer: Self-pay | Source: Home / Self Care | Attending: Cardiology

## 2014-07-26 DIAGNOSIS — I5032 Chronic diastolic (congestive) heart failure: Secondary | ICD-10-CM | POA: Diagnosis present

## 2014-07-26 DIAGNOSIS — Z800 Family history of malignant neoplasm of digestive organs: Secondary | ICD-10-CM | POA: Diagnosis not present

## 2014-07-26 DIAGNOSIS — I739 Peripheral vascular disease, unspecified: Secondary | ICD-10-CM | POA: Diagnosis present

## 2014-07-26 DIAGNOSIS — I059 Rheumatic mitral valve disease, unspecified: Secondary | ICD-10-CM

## 2014-07-26 DIAGNOSIS — H9190 Unspecified hearing loss, unspecified ear: Secondary | ICD-10-CM | POA: Diagnosis present

## 2014-07-26 DIAGNOSIS — R42 Dizziness and giddiness: Secondary | ICD-10-CM | POA: Diagnosis present

## 2014-07-26 DIAGNOSIS — Z823 Family history of stroke: Secondary | ICD-10-CM | POA: Diagnosis not present

## 2014-07-26 DIAGNOSIS — I10 Essential (primary) hypertension: Secondary | ICD-10-CM

## 2014-07-26 DIAGNOSIS — E039 Hypothyroidism, unspecified: Secondary | ICD-10-CM | POA: Diagnosis present

## 2014-07-26 DIAGNOSIS — Z79899 Other long term (current) drug therapy: Secondary | ICD-10-CM | POA: Diagnosis not present

## 2014-07-26 DIAGNOSIS — Z8249 Family history of ischemic heart disease and other diseases of the circulatory system: Secondary | ICD-10-CM | POA: Diagnosis not present

## 2014-07-26 DIAGNOSIS — I509 Heart failure, unspecified: Secondary | ICD-10-CM | POA: Diagnosis present

## 2014-07-26 DIAGNOSIS — Z888 Allergy status to other drugs, medicaments and biological substances status: Secondary | ICD-10-CM | POA: Diagnosis not present

## 2014-07-26 DIAGNOSIS — K219 Gastro-esophageal reflux disease without esophagitis: Secondary | ICD-10-CM | POA: Diagnosis present

## 2014-07-26 DIAGNOSIS — E7800 Pure hypercholesterolemia, unspecified: Secondary | ICD-10-CM | POA: Diagnosis present

## 2014-07-26 DIAGNOSIS — E119 Type 2 diabetes mellitus without complications: Secondary | ICD-10-CM | POA: Diagnosis present

## 2014-07-26 DIAGNOSIS — Z7982 Long term (current) use of aspirin: Secondary | ICD-10-CM | POA: Diagnosis not present

## 2014-07-26 DIAGNOSIS — Z96659 Presence of unspecified artificial knee joint: Secondary | ICD-10-CM | POA: Diagnosis not present

## 2014-07-26 DIAGNOSIS — H409 Unspecified glaucoma: Secondary | ICD-10-CM | POA: Diagnosis present

## 2014-07-26 DIAGNOSIS — I495 Sick sinus syndrome: Secondary | ICD-10-CM

## 2014-07-26 HISTORY — PX: PACEMAKER INSERTION: SHX728

## 2014-07-26 HISTORY — PX: PERMANENT PACEMAKER INSERTION: SHX5480

## 2014-07-26 LAB — GLUCOSE, CAPILLARY
GLUCOSE-CAPILLARY: 108 mg/dL — AB (ref 70–99)
Glucose-Capillary: 214 mg/dL — ABNORMAL HIGH (ref 70–99)

## 2014-07-26 LAB — TSH: TSH: 0.903 u[IU]/mL (ref 0.350–4.500)

## 2014-07-26 SURGERY — PERMANENT PACEMAKER INSERTION
Anesthesia: LOCAL

## 2014-07-26 MED ORDER — CHLORHEXIDINE GLUCONATE 4 % EX LIQD
60.0000 mL | Freq: Once | CUTANEOUS | Status: AC
  Administered 2014-07-26: 4 via TOPICAL
  Filled 2014-07-26: qty 60

## 2014-07-26 MED ORDER — ACETAMINOPHEN 325 MG PO TABS
325.0000 mg | ORAL_TABLET | ORAL | Status: DC | PRN
  Administered 2014-07-27: 650 mg via ORAL
  Filled 2014-07-26: qty 2

## 2014-07-26 MED ORDER — CEFAZOLIN SODIUM 1-5 GM-% IV SOLN
1.0000 g | Freq: Four times a day (QID) | INTRAVENOUS | Status: DC
  Filled 2014-07-26 (×3): qty 50

## 2014-07-26 MED ORDER — ACETAMINOPHEN 325 MG PO TABS
650.0000 mg | ORAL_TABLET | ORAL | Status: DC | PRN
  Administered 2014-07-26: 650 mg via ORAL
  Filled 2014-07-26: qty 2

## 2014-07-26 MED ORDER — LIDOCAINE HCL (PF) 1 % IJ SOLN
INTRAMUSCULAR | Status: AC
  Filled 2014-07-26: qty 60

## 2014-07-26 MED ORDER — ZOLPIDEM TARTRATE 5 MG PO TABS: 5.0000 mg | ORAL_TABLET | Freq: Every evening | ORAL | Status: DC | PRN

## 2014-07-26 MED ORDER — ONDANSETRON HCL 4 MG/2ML IJ SOLN
4.0000 mg | Freq: Four times a day (QID) | INTRAMUSCULAR | Status: DC | PRN
Start: 2014-07-26 — End: 2014-07-26

## 2014-07-26 MED ORDER — ALPRAZOLAM 0.25 MG PO TABS: 0.2500 mg | ORAL_TABLET | Freq: Two times a day (BID) | ORAL | Status: DC | PRN

## 2014-07-26 MED ORDER — SODIUM CHLORIDE 0.9 % IV SOLN
INTRAVENOUS | Status: DC
  Administered 2014-07-26: 15:00:00 via INTRAVENOUS

## 2014-07-26 MED ORDER — CHLORHEXIDINE GLUCONATE 4 % EX LIQD: 60.0000 mL | Freq: Once | CUTANEOUS | Status: AC

## 2014-07-26 MED ORDER — INSULIN ASPART 100 UNIT/ML ~~LOC~~ SOLN: 0.0000 [IU] | Freq: Three times a day (TID) | SUBCUTANEOUS | Status: DC

## 2014-07-26 MED ORDER — HEPARIN (PORCINE) IN NACL 2-0.9 UNIT/ML-% IJ SOLN
INTRAMUSCULAR | Status: AC
  Filled 2014-07-26: qty 500

## 2014-07-26 MED ORDER — MIDAZOLAM HCL 5 MG/5ML IJ SOLN
INTRAMUSCULAR | Status: AC
  Filled 2014-07-26: qty 5

## 2014-07-26 MED ORDER — FENTANYL CITRATE 0.05 MG/ML IJ SOLN
INTRAMUSCULAR | Status: AC
  Filled 2014-07-26: qty 2

## 2014-07-26 MED ORDER — INSULIN ASPART 100 UNIT/ML ~~LOC~~ SOLN: 0.0000 [IU] | Freq: Every day | SUBCUTANEOUS | Status: DC

## 2014-07-26 MED ORDER — CEFAZOLIN SODIUM-DEXTROSE 2-3 GM-% IV SOLR
2.0000 g | INTRAVENOUS | Status: DC
  Filled 2014-07-26: qty 50

## 2014-07-26 MED ORDER — GENTAMICIN SULFATE 40 MG/ML IJ SOLN
80.0000 mg | INTRAMUSCULAR | Status: DC
  Filled 2014-07-26: qty 2

## 2014-07-26 MED ORDER — YOU HAVE A PACEMAKER BOOK
Freq: Once | Status: AC
  Administered 2014-07-26: 22:00:00
  Filled 2014-07-26: qty 1

## 2014-07-26 MED ORDER — CEFAZOLIN SODIUM 1-5 GM-% IV SOLN
1.0000 g | Freq: Four times a day (QID) | INTRAVENOUS | Status: DC
  Administered 2014-07-26 – 2014-07-27 (×2): 1 g via INTRAVENOUS
  Filled 2014-07-26 (×3): qty 50

## 2014-07-26 MED ORDER — ONDANSETRON HCL 4 MG/2ML IJ SOLN: 4.0000 mg | Freq: Four times a day (QID) | INTRAMUSCULAR | Status: DC | PRN

## 2014-07-26 NOTE — CV Procedure (Signed)
SURGEON:  Cristopher Peru, MD     PREPROCEDURE DIAGNOSIS:  Symptomatic Bradycardia due to sinus node dysfunction    POSTPROCEDURE DIAGNOSIS:  Same as preprocedure diagnosis     PROCEDURES:   1. Pacemaker implantation.     INTRODUCTION: Veronica Young is a 80 y.o. female  with a history of bradycardia who presents today for pacemaker implantation.  The patient reports intermittent episodes of dizziness over the past few months.  No reversible causes have been identified.  The patient therefore presents today for pacemaker implantation.     DESCRIPTION OF PROCEDURE:  Informed written consent was obtained, and   the patient was brought to the electrophysiology lab in a fasting state.  The patient required no sedation for the procedure today.  The patients left chest was prepped and draped in the usual sterile fashion by the EP lab staff. The skin overlying the left deltopectoral region was infiltrated with lidocaine for local analgesia.  A 4-cm incision was made over the left deltopectoral region.  A left subcutaneous pacemaker pocket was fashioned using a combination of sharp and blunt dissection. Electrocautery was required to assure hemostasis.     RA/RV Lead Placement: The left axillary vein was therefore punctured x2.  Through the left axillary vein, a Medtronic (serial number [id number]) model 5076 right atrial lead and a Medtronic (serial number [id number] model 5076 right ventricular lead were advanced with fluoroscopic visualization into the right atrial appendage and right ventricular apical septal positions respectively.  Initial atrial lead P- waves measured 1.6 mV with impedance of 678 ohms and a threshold of 1.1 V at 0.5 msec.  Right ventricular lead R-waves measured 16.5 mV with an impedance of 842 ohms and a threshold of 0.5 V at 0.5 msec.  Both leads were secured to the pectoralis fascia using #2-0 silk over the suture sleeves.   Device Placement:  The leads were then connected to a  Medtronic ADAPTA L (serial number [id number]) dual-chamber pacemaker.  The pocket was irrigated with copious gentamicin solution.  The pacemaker was then placed into the pocket.  The pocket was then closed in 2 layers with 2.0 Vicryl suture for the subcutaneous and subcuticular layers.  Steri-Strips and a sterile dressing were then applied.  There were no early apparent complications.     CONCLUSIONS:   1. Successful implantation of a Medtronic dual-chamber pacemaker for symptomatic bradycardia due to sinus node dysfunction  2. No early apparent complications.           Cristopher Peru, MD 07/26/2014 5:27 PM

## 2014-07-26 NOTE — Consult Note (Signed)
ELECTROPHYSIOLOGY CONSULT NOTE    Patient ID: Veronica Young MRN: [id number], DOB/AGE: 78-Oct-1934 78 y.o.  Admit date: 07/25/2014 Date of Consult: 07-26-14  Primary Physician: Saralyn Pilar Primary Cardiologist: [name]  Reason for Consultation: bradycardia  HPI:  Veronica Young is a 80 y.o. female with a past medical history significant for hypertension, diabetes, hyperlipidemia, hypothyroidism, PAD, chronic diastolic heart failure and deafness.  She presented to [facility] yesterday with shortness of breath and dizziness.  She was found to be bradycardic with heart rates in the 30's.  She is on Toprol at home with last dose 07-24-14 (pm).  This has been held since admission (over 5 half lives ago).  Telemetry demonstrates persistent intermittent bradycardia with intermittent junctional rhythm.  EP has been asked to evaluate for treatment options and consider PPM insertion.  TSH is pending this admission.  Echo pending this admission.  Last echo 11-2012 demonstrated EF [id number], grade 2 diastolic dysfunction, mild MR, LA 40.    She describes for the last 2 weeks intermittent dizzy spells that are abrupt in onset and offset.  They are not positional or associated with turning her head.  She has not had frank syncope.  She denies chest pain, palpitations.  ROS is otherwise negative.   Past Medical History  Diagnosis Date  . Hypertension   . Diabetes mellitus     type 2  . Hypercholesterolemia   . Deafness   . Carotid bruit   . GERD (gastroesophageal reflux disease)   . Glaucoma   . Hypothyroidism   . PAT (paroxysmal atrial tachycardia)   . PAD (peripheral artery disease)   . Diastolic heart failure     EF is 60%  . Normal cardiac stress test January 2014     Surgical History:  Past Surgical History  Procedure Laterality Date  . Cholycystectomy    . Total knee arthroplasty      right  . Left breast cyst removed       Prescriptions prior to admission  Medication Sig Dispense  Refill  . aspirin EC 81 MG tablet Take 81 mg by mouth every evening.       [name] atorvastatin (LIPITOR) 80 MG tablet Take 80 mg by mouth daily.      [name] atropine 1 % ophthalmic solution Place 1 drop into the left eye 2 (two) times daily.       . calcium-vitamin D (OSCAL WITH D) 500-200 MG-UNIT per tablet Take 1 tablet by mouth daily with breakfast.      . cholecalciferol (VITAMIN D) 1000 UNITS tablet Take 1,000 Units by mouth daily.      . cilostazol (PLETAL) 50 MG tablet Take 50 mg by mouth 2 (two) times daily.      . citalopram (CELEXA) 20 MG tablet Take 20 mg by mouth daily.       . dorzolamide-timolol (COSOPT) 22.3-6.8 MG/ML ophthalmic solution Place 1 drop into both eyes 2 (two) times daily.       . hydrALAZINE (APRESOLINE) 10 MG tablet Take 10 mg by mouth 3 (three) times daily.      [name] levothyroxine (SYNTHROID, LEVOTHROID) 112 MCG tablet Take 112 mcg by mouth daily before breakfast.       . lisinopril (PRINIVIL,ZESTRIL) 40 MG tablet Take 1 tablet (40 mg total) by mouth daily.  90 tablet  3  . metoprolol succinate (TOPROL-XL) 25 MG 24 hr tablet Take 25 mg by mouth daily.      . potassium chloride (  K-DUR) 10 MEQ tablet Take 10 mEq by mouth daily.        Inpatient Medications:  . amLODipine  5 mg Oral Daily  . aspirin EC  81 mg Oral QPM  . atorvastatin  80 mg Oral Daily  . atropine  1 drop Left Eye BID  . cilostazol  50 mg Oral BID  . citalopram  20 mg Oral Daily  . dorzolamide-timolol  1 drop Both Eyes BID  . hydrALAZINE  25 mg Oral 3 times per day  . levothyroxine  112 mcg Oral QAC breakfast  . lisinopril  40 mg Oral Daily  . potassium chloride  10 mEq Oral Daily  . sodium chloride  3 mL Intravenous Q12H    Allergies:  Allergies  Allergen Reactions  . Amlodipine Other (See Comments)    Nausea  . Benicar [Olmesartan Medoxomil] Other (See Comments)    Reaction unknown    History   Social History  . Marital Status: Married    Spouse Name: N/A    Number of Children: 4  . Years  of Education: N/A   Occupational History  . Not on file.   Social History Main Topics  . Smoking status: Never Smoker   . Smokeless tobacco: Never Used  . Alcohol Use: No  . Drug Use: No  . Sexual Activity: No   Other Topics Concern  . Not on file   Social History Narrative  . No narrative on file     Family History  Problem Relation Age of Onset  . Heart disease Mother   . Liver cancer Father   . Heart disease Sister   . Heart disease Brother   . Stroke Sister     BP 146/53  Pulse 52  Temp(Src) 98 F (36.7 C) (Oral)  Resp 18  Ht 5\' 6"  (1.676 m)  Wt 145 lb 11.2 oz (66.089 kg)  BMI 23.53 kg/m2  SpO2 95%  Physical Exam: Well appearing 80 yo woman, NAD HEENT: Unremarkable,[place], AT Neck:  6 JVD, no thyromegally Back:  No CVA tenderness Lungs:  Clear with no wheezes, rales, or rhonchi HEART:  Regular brady rhythm, no murmurs, no rubs, no clicks Abd:  soft, positive bowel sounds, no organomegally, no rebound, no guarding Ext:  2 plus pulses, no edema, no cyanosis, no clubbing Skin:  No rashes no nodules Neuro:  CN II through XII intact, motor grossly intact   Labs:   Lab Results  Component Value Date   WBC 6.6 07/25/2014   HGB 12.5 07/25/2014   HCT 38.0 07/25/2014   MCV 83.2 07/25/2014   PLT 191 07/25/2014    Recent Labs Lab 07/25/14 1059  NA 135*  K 4.6  CL 101  CO2 22  BUN 24*  CREATININE 1.12*  CALCIUM 9.4  GLUCOSE 93    Radiology/Studies: Dg Chest Portable 1 View 07/25/2014   CLINICAL DATA:  Shortness of breath, dizziness  EXAM: PORTABLE CHEST - 1 VIEW  COMPARISON:  11/27/2012  FINDINGS: Mild cardiomegaly. No overt edema. No confluent airspace opacities or effusions. No acute bony abnormality.  IMPRESSION: No active disease.   Electronically Signed   By: Rolm Baptise M.D.   On: 07/25/2014 10:49    EKG: sinus brady, rate 39, normal intervals, narrow QRS EKG from 2013 demonstrates sinus rhythm with intermittent atrial tachycardia EKG from 2012 demonstrates  long RP tachycardia at rate of 145  TELEMETRY: sinus rhythm with intermittent sinus bradycardia, intermittent junctional rhythm  A/P 1. Symptomatic  bradycardia 2. HTN 3. H/o SVT 4. deafness Rec: I have discussed the treatment options with the patient and her daughter. The risk, goals, benefits, and expectations of permanent pacemaker insertion were discussed in detail, and the patient wishes to proceed with pacemaker insertion.  Cristopher Peru, M.D.

## 2014-07-26 NOTE — Progress Notes (Signed)
Patient Name: Veronica Young Date of Encounter: 07/26/2014  Principal Problem:   Dizziness Active Problems:   DM2 (diabetes mellitus, type 2)   Hypercholesterolemia   Hypothyroidism   PAD (peripheral artery disease)   Chronic diastolic CHF (congestive heart failure)   Malignant hypertension   Acquired deafness   Dyspnea    Patient Profile: 80 yo female with a hisory of uncontrolled HTN, DM2, deafness, PAD, PAT, hypothyroidism, chronic diastolic CHF, was admitted 08/03 w/ dizziness, sinus brady 30s, on Toprol XL 25 mg qd (held).   SUBJECTIVE: No symptoms overnight, no dizziness, chest pain or SOB. Still drives  OBJECTIVE Filed Vitals:   07/25/14 1942 07/25/14 2220 07/26/14 0517 07/26/14 0631  BP: 154/55 149/56 130/46 146/53  Pulse: 55  52   Temp: 97.6 F (36.4 C)  98 F (36.7 C)   TempSrc: Oral  Oral   Resp: 16  18   Height:   5\' 6"  (1.676 m)   Weight:   145 lb 11.2 oz (66.089 kg)   SpO2: 96%  95%     Intake/Output Summary (Last 24 hours) at 07/26/14 0844 Last data filed at 07/26/14 0541  Gross per 24 hour  Intake    340 ml  Output    900 ml  Net   -560 ml   Filed Weights   07/26/14 0517  Weight: 145 lb 11.2 oz (66.089 kg)    PHYSICAL EXAM General: Well developed, well nourished, female in no acute distress. Head: Normocephalic, atraumatic.  Neck: Supple without bruits, JVD not elevated Lungs:  Resp regular and unlabored, few rales bases. Heart: RRR, S1, S2, no S3, S4, soft murmur; no rub. Abdomen: Soft, non-tender, non-distended, BS + x 4.  Extremities: No clubbing, cyanosis, no edema.  Neuro: Alert and oriented X 3. Moves all extremities spontaneously. Psych: Normal affect.  LABS: CBC: Recent Labs  07/25/14 1059  WBC 6.6  HGB 12.5  HCT 38.0  MCV 83.2  PLT 99991111   Basic Metabolic Panel: Recent Labs  07/25/14 1059  NA 135*  K 4.6  CL 101  CO2 22  GLUCOSE 93  BUN 24*  CREATININE 1.12*  CALCIUM 9.4    Recent Labs  07/25/14 1053    TROPIPOC 0.00   BNP: Pro B Natriuretic peptide (BNP)  Date/Time Value Ref Range Status  07/25/2014 10:57 AM 578.9* 0 - 450 pg/mL Final  11/28/2012  3:45 AM 986.9* 0 - 450 pg/mL Final   Lab Results  Component Value Date   TSH 1.389 07/05/2011    TELE:  SR, junctional rhythm, PACs w/ > 3 sec pause after, sinus bradycardia      Radiology/Studies: Dg Chest Portable 1 View 07/25/2014   CLINICAL DATA:  Shortness of breath, dizziness  EXAM: PORTABLE CHEST - 1 VIEW  COMPARISON:  11/27/2012  FINDINGS: Mild cardiomegaly. No overt edema. No confluent airspace opacities or effusions. No acute bony abnormality.  IMPRESSION: No active disease.   Electronically Signed   By: Rolm Baptise M.D.   On: 07/25/2014 10:49   Current Medications:  . amLODipine  5 mg Oral Daily  . aspirin EC  81 mg Oral QPM  . atorvastatin  80 mg Oral Daily  . atropine  1 drop Left Eye BID  . cilostazol  50 mg Oral BID  . citalopram  20 mg Oral Daily  . dorzolamide-timolol  1 drop Both Eyes BID  . hydrALAZINE  25 mg Oral 3 times per day  . levothyroxine  112 mcg Oral QAC breakfast  . lisinopril  40 mg Oral Daily  . potassium chloride  10 mEq Oral Daily  . sodium chloride  3 mL Intravenous Q12H      ASSESSMENT AND PLAN: Principal Problem:   Dizziness - felt secondary to bradycardia, BB held, last dose 08/02 pm. EP aware, tentatively planned for PPM in am (off BB 48 hr). OK to ambulate with assistance, keep on telemetry.  Active Problems:   DM2 (diabetes mellitus, type 2) - add SSI    Hypercholesterolemia - follow, on rx    Hypothyroidism - will ck TSH    PAD (peripheral artery disease) - no sx, follow    Chronic diastolic CHF (congestive heart failure) - wt is at/below baseline, follow    Malignant hypertension - BP very high yesterday, max 227/80. Hydralazine increased, improved control. With PAD, MD advise BP goals, may need higher BP with ABIs 0.40-0.50 range bilaterally    Acquired deafness - interpreter in  room.     Dyspnea - currently denies, OK to ambulate.  SignedRosaria Ferries , PA-C 8:44 AM 07/26/2014   Patient seen and examined. Agree with assessment and plan. Last dose of BB was on 8/2. Currently sinus rhythm with occasional pauses. For permanent pacemaker tomorrow.   Troy Sine, MD, [facility] 07/26/2014 9:50 AM

## 2014-07-26 NOTE — Progress Notes (Signed)
UR completed 

## 2014-07-26 NOTE — Progress Notes (Signed)
  Echocardiogram 2D Echocardiogram has been performed.  Veronica Young 07/26/2014, 10:58 AM

## 2014-07-26 NOTE — Progress Notes (Signed)
At about 0050, Pt heart rate dropped to 29 for a few seconds then went back up and sustained in the upper 40's, lower 50s.  MD on call notified.  No new orders at this time.

## 2014-07-26 NOTE — Progress Notes (Signed)
Around 9pm, pt HR sustained in the 30s.  Pt asymptomatic at this time.  MD on call, Dr. Inda Castle, notified.  No new orders at this time.  Will continue to monitor.

## 2014-07-27 ENCOUNTER — Inpatient Hospital Stay (HOSPITAL_COMMUNITY): Payer: Medicare HMO

## 2014-07-27 DIAGNOSIS — I498 Other specified cardiac arrhythmias: Secondary | ICD-10-CM

## 2014-07-27 LAB — GLUCOSE, CAPILLARY: Glucose-Capillary: 109 mg/dL — ABNORMAL HIGH (ref 70–99)

## 2014-07-27 MED ORDER — HYDRALAZINE HCL 25 MG PO TABS: 25.0000 mg | ORAL_TABLET | Freq: Three times a day (TID) | ORAL | Status: DC

## 2014-07-27 NOTE — Discharge Summary (Signed)
ELECTROPHYSIOLOGY PROCEDURE DISCHARGE SUMMARY    Patient ID: Veronica Young,  MRN: [id number], DOB/AGE: 1933-12-10 80 y.o.  Admit date: 07/25/2014 Discharge date: 07/27/2014  Primary Care Physician: Saralyn Pilar Primary Cardiologist: [name] PV: [place] Electrophysiologist: Lovena Le  Primary Discharge Diagnosis:  Symptomatic bradycardia status post pacemaker implantation this admission  Secondary Discharge Diagnosis:  1.  Hypertension 2.  SVT 3.  Diabetes 4.  Deafness 5.  PAD 6.  Hypothyroidism 7.  Chronic diastolic heart failure  Allergies  Allergen Reactions  . Amlodipine Other (See Comments)    Nausea  . Benicar [Olmesartan Medoxomil] Other (See Comments)    Reaction unknown     Procedures This Admission:  1.  Echocardiogram on 07-26-2014 demonstrated EF [id number], grade 2 diastolic dysfunction, moderate pulmonary hypertension 2.  Implantation of dual chamber pacemaker on 07-26-2014 by Dr Lovena Le.  The patient received a MDT ADDRL1 pacemaker with model number [id number] right atrial and right ventricular leads.  There were no early apparent complications. Normal device function. 3.  CXR on 07-27-14 demonstrated no ptx status post device implantation.   Brief HPI/Hospital Course: Veronica Young is a 80 y.o. female with a past medical history as outlined above.  She presented to the hospital on the day of admission with dizziness and shortness of breath.  She has had intermittent episodes of dizziness for the past 2 weeks that have been increasing in frequency and duration.  She was admitted and her toprol was held for >5 half lives without resolution of her bradycardia.  She was evaluated by EP who recommended pacemaker implantation.  The patient underwent implantation of a MDT dual chamber pacemaker with details as outlined above.   She was monitored on telemetry overnight which demonstrated atrial pacing with intrinsic ventricular conduction and sinus rhythm.  Left chest was  without hematoma or ecchymosis.  The device was interrogated and found to be functioning normally.  CXR was obtained and demonstrated no pneumothorax status post device implantation.  Wound care, arm mobility, and restrictions were reviewed with the patient.  Dr Lovena Le examined the patient and considered them stable for discharge to home.    Discharge Vitals: Blood pressure 141/52, pulse 62, temperature 98.1 F (36.7 C), temperature source Oral, resp. rate 18, height 5\' 6"  (1.676 m), weight 147 lb 11.3 oz (67 kg), SpO2 95.00%.    Labs:   Lab Results  Component Value Date   WBC 6.6 07/25/2014   HGB 12.5 07/25/2014   HCT 38.0 07/25/2014   MCV 83.2 07/25/2014   PLT 191 07/25/2014     Recent Labs Lab 07/25/14 1059  NA 135*  K 4.6  CL 101  CO2 22  BUN 24*  CREATININE 1.12*  CALCIUM 9.4  GLUCOSE 93    Discharge Medications:    Medication List    ASK your doctor about these medications       aspirin EC 81 MG tablet  Take 81 mg by mouth every evening.     atorvastatin 80 MG tablet  Commonly known as:  LIPITOR  Take 80 mg by mouth daily.     atropine 1 % ophthalmic solution  Place 1 drop into the left eye 2 (two) times daily.     calcium-vitamin D 500-200 MG-UNIT per tablet  Commonly known as:  OSCAL WITH D  Take 1 tablet by mouth daily with breakfast.     cholecalciferol 1000 UNITS tablet  Commonly known as:  VITAMIN D  Take 1,000 Units by  mouth daily.     cilostazol 50 MG tablet  Commonly known as:  PLETAL  Take 50 mg by mouth 2 (two) times daily.     citalopram 20 MG tablet  Commonly known as:  CELEXA  Take 20 mg by mouth daily.     dorzolamide-timolol 22.3-6.8 MG/ML ophthalmic solution  Commonly known as:  COSOPT  Place 1 drop into both eyes 2 (two) times daily.     hydrALAZINE 10 MG tablet  Commonly known as:  APRESOLINE  Take 10 mg by mouth 3 (three) times daily.     levothyroxine 112 MCG tablet  Commonly known as:  SYNTHROID, LEVOTHROID  Take 112 mcg by  mouth daily before breakfast.     lisinopril 40 MG tablet  Commonly known as:  PRINIVIL,ZESTRIL  Take 1 tablet (40 mg total) by mouth daily.     metoprolol succinate 25 MG 24 hr tablet  Commonly known as:  TOPROL-XL  Take 25 mg by mouth daily.     potassium chloride 10 MEQ tablet  Commonly known as:  K-DUR  Take 10 mEq by mouth daily.        Disposition:     Duration of Discharge Encounter: Greater than 30 minutes including physician time.  Signed,  Mikle Bosworth.D.

## 2014-07-27 NOTE — Discharge Instructions (Signed)
° °  Supplemental Discharge Instructions for  Pacemaker/Defibrillator Patients  Activity No heavy lifting or vigorous activity with your left/right arm for 6 to 8 weeks.  Do not raise your left/right arm above your head for one week.  Gradually raise your affected arm as drawn below.                       08/07                   08/08                    08/09                    08/10            NO DRIVING for  one week    ; you may begin driving on     [id number]     . WOUND CARE   Keep the wound area clean and dry.  Do not get this area wet for one week. No showers for one week; you may shower on      08/11        .   The tape/steri-strips on your wound will fall off; do not pull them off.  No bandage is needed on the site.  DO  NOT apply any creams, oils, or ointments to the wound area.   If you notice any drainage or discharge from the wound, any swelling or bruising at the site, or you develop a fever > 101? F after you are discharged home, call the office at once.  Special Instructions   You are still able to use cellular telephones; use the ear opposite the side where you have your pacemaker/defibrillator.  Avoid carrying your cellular phone near your device.   When traveling through airports, show security personnel your identification card to avoid being screened in the metal detectors.  Ask the security personnel to use the hand wand.   Avoid arc welding equipment, MRI testing (magnetic resonance imaging), TENS units (transcutaneous nerve stimulators).  Call the office for questions about other devices.   Avoid electrical appliances that are in poor condition or are not properly grounded.   Microwave ovens are safe to be near or to operate.  Additional information for defibrillator patients should your device go off:   If your device goes off ONCE and you feel fine afterward, notify the device clinic nurses.   If your device goes off ONCE and you do not feel well afterward, call 911.   If your device goes off TWICE, call 911.   If your device goes off THREE times in one day, call 911.  DO NOT DRIVE YOURSELF OR A FAMILY MEMBER WITH A DEFIBRILLATOR TO THE HOSPITAL--CALL 911.

## 2014-07-28 ENCOUNTER — Encounter (HOSPITAL_COMMUNITY): Payer: Self-pay | Admitting: *Deleted

## 2014-08-01 ENCOUNTER — Telehealth: Payer: Self-pay | Admitting: Internal Medicine

## 2014-08-01 ENCOUNTER — Other Ambulatory Visit: Payer: Self-pay | Admitting: *Deleted

## 2014-08-01 DIAGNOSIS — I10 Essential (primary) hypertension: Secondary | ICD-10-CM

## 2014-08-01 DIAGNOSIS — I739 Peripheral vascular disease, unspecified: Secondary | ICD-10-CM

## 2014-08-01 NOTE — Telephone Encounter (Signed)
Scheduled renal artery [place] for this patient 8-19 @ 930an (pt deaf) and daughter Tora Duck would like a call from a tech to tell her about the test/what 's involved please

## 2014-08-01 NOTE — Telephone Encounter (Signed)
Attempted to call daughter.  LMOM with my direct number to have her call me back.

## 2014-08-09 NOTE — Telephone Encounter (Signed)
Spoke with daughter today regarding how the exam is performed, and what prep is needed.  Appt notes indicate interpreter has been requested.

## 2014-08-10 ENCOUNTER — Ambulatory Visit (HOSPITAL_COMMUNITY): Payer: Medicare HMO | Attending: Cardiovascular Disease | Admitting: *Deleted

## 2014-08-10 ENCOUNTER — Ambulatory Visit (INDEPENDENT_AMBULATORY_CARE_PROVIDER_SITE_OTHER): Payer: Medicare HMO | Admitting: *Deleted

## 2014-08-10 DIAGNOSIS — I495 Sick sinus syndrome: Secondary | ICD-10-CM

## 2014-08-10 DIAGNOSIS — I471 Supraventricular tachycardia: Secondary | ICD-10-CM

## 2014-08-10 DIAGNOSIS — I10 Essential (primary) hypertension: Secondary | ICD-10-CM | POA: Insufficient documentation

## 2014-08-10 DIAGNOSIS — I739 Peripheral vascular disease, unspecified: Secondary | ICD-10-CM

## 2014-08-10 LAB — MDC_IDC_ENUM_SESS_TYPE_INCLINIC
Battery Impedance: 100 Ohm
Battery Remaining Longevity: 160 mo
Brady Statistic AP VS Percent: 13 %
Brady Statistic AS VP Percent: 1 %
Lead Channel Impedance Value: 559 Ohm
Lead Channel Pacing Threshold Pulse Width: 0.4 ms
Lead Channel Sensing Intrinsic Amplitude: 2 mV
Lead Channel Sensing Intrinsic Amplitude: 22.4 mV
Lead Channel Setting Pacing Amplitude: 3.5 V
Lead Channel Setting Pacing Pulse Width: 0.4 ms
Lead Channel Setting Sensing Sensitivity: 5.6 mV
MDC IDC MSMT BATTERY VOLTAGE: 2.79 V
MDC IDC MSMT LEADCHNL RA PACING THRESHOLD AMPLITUDE: 0.5 V
MDC IDC MSMT LEADCHNL RA PACING THRESHOLD PULSEWIDTH: 0.4 ms
MDC IDC MSMT LEADCHNL RV IMPEDANCE VALUE: 607 Ohm
MDC IDC MSMT LEADCHNL RV PACING THRESHOLD AMPLITUDE: 0.5 V
MDC IDC SESS DTM: 20150819111302
MDC IDC SET LEADCHNL RV PACING AMPLITUDE: 3.5 V
MDC IDC STAT BRADY AP VP PERCENT: 0 %
MDC IDC STAT BRADY AS VS PERCENT: 87 %

## 2014-08-10 NOTE — Progress Notes (Signed)
Wound check appointment. Steri-strips removed. Wound without redness or edema. Incision edges approximated, wound well healed. Stitch removed from L corner of incision site---betadine and bandage applied. Normal device function. Thresholds, sensing, and impedances consistent with implant measurements. Device programmed at 3.5V for extra safety margin until 3 month visit. Histogram distribution appropriate for patient and level of activity. 10 mode switches (<0.1%)---2 AHR episodes---max dur. 11 mins 26 sec, Max A 170, Max V 175---SVT. 1 high ventricular rate noted x 5 sec @ 170/187---SVT. Patient educated about wound care, arm mobility, lifting restrictions. ROV in 3 months with GT.

## 2014-08-10 NOTE — Progress Notes (Signed)
Visceral Duplex Complete

## 2014-08-26 ENCOUNTER — Encounter: Payer: Self-pay | Admitting: Internal Medicine

## 2014-09-12 ENCOUNTER — Ambulatory Visit: Payer: Medicare HMO | Admitting: Cardiovascular Disease

## 2014-09-15 ENCOUNTER — Encounter (HOSPITAL_COMMUNITY): Payer: Self-pay | Admitting: Emergency Medicine

## 2014-09-15 ENCOUNTER — Inpatient Hospital Stay (HOSPITAL_COMMUNITY)
Admission: EM | Admit: 2014-09-15 | Discharge: 2014-09-16 | DRG: 305 | Disposition: A | Discharge status: Home / Self Care | Payer: Medicare HMO | Attending: Internal Medicine | Admitting: Internal Medicine

## 2014-09-15 ENCOUNTER — Emergency Department (HOSPITAL_COMMUNITY): Payer: Medicare HMO

## 2014-09-15 DIAGNOSIS — E7800 Pure hypercholesterolemia, unspecified: Secondary | ICD-10-CM | POA: Diagnosis present

## 2014-09-15 DIAGNOSIS — Z950 Presence of cardiac pacemaker: Secondary | ICD-10-CM | POA: Diagnosis not present

## 2014-09-15 DIAGNOSIS — R0789 Other chest pain: Secondary | ICD-10-CM

## 2014-09-15 DIAGNOSIS — I509 Heart failure, unspecified: Secondary | ICD-10-CM | POA: Diagnosis present

## 2014-09-15 DIAGNOSIS — I5032 Chronic diastolic (congestive) heart failure: Secondary | ICD-10-CM | POA: Diagnosis present

## 2014-09-15 DIAGNOSIS — I10 Essential (primary) hypertension: Secondary | ICD-10-CM | POA: Diagnosis not present

## 2014-09-15 DIAGNOSIS — E119 Type 2 diabetes mellitus without complications: Secondary | ICD-10-CM | POA: Diagnosis present

## 2014-09-15 DIAGNOSIS — H9190 Unspecified hearing loss, unspecified ear: Secondary | ICD-10-CM | POA: Diagnosis present

## 2014-09-15 DIAGNOSIS — K219 Gastro-esophageal reflux disease without esophagitis: Secondary | ICD-10-CM | POA: Diagnosis present

## 2014-09-15 DIAGNOSIS — Z79899 Other long term (current) drug therapy: Secondary | ICD-10-CM | POA: Diagnosis not present

## 2014-09-15 DIAGNOSIS — I739 Peripheral vascular disease, unspecified: Secondary | ICD-10-CM | POA: Diagnosis present

## 2014-09-15 DIAGNOSIS — Z7982 Long term (current) use of aspirin: Secondary | ICD-10-CM | POA: Diagnosis not present

## 2014-09-15 DIAGNOSIS — Z96659 Presence of unspecified artificial knee joint: Secondary | ICD-10-CM

## 2014-09-15 DIAGNOSIS — R0602 Shortness of breath: Secondary | ICD-10-CM | POA: Diagnosis not present

## 2014-09-15 DIAGNOSIS — R0600 Dyspnea, unspecified: Secondary | ICD-10-CM

## 2014-09-15 DIAGNOSIS — R079 Chest pain, unspecified: Secondary | ICD-10-CM | POA: Diagnosis present

## 2014-09-15 DIAGNOSIS — E039 Hypothyroidism, unspecified: Secondary | ICD-10-CM | POA: Diagnosis present

## 2014-09-15 DIAGNOSIS — I160 Hypertensive urgency: Secondary | ICD-10-CM | POA: Diagnosis present

## 2014-09-15 LAB — CBC
HEMATOCRIT: 34.8 % — AB (ref 36.0–46.0)
Hemoglobin: 12.1 g/dL (ref 12.0–15.0)
MCH: 28.9 pg (ref 26.0–34.0)
MCHC: 34.8 g/dL (ref 30.0–36.0)
MCV: 83.3 fL (ref 78.0–100.0)
Platelets: 171 10*3/uL (ref 150–400)
RBC: 4.18 MIL/uL (ref 3.87–5.11)
RDW: 15.1 % (ref 11.5–15.5)
WBC: 4.7 10*3/uL (ref 4.0–10.5)

## 2014-09-15 LAB — BASIC METABOLIC PANEL
Anion gap: 13 (ref 5–15)
BUN: 16 mg/dL (ref 6–23)
CO2: 22 mEq/L (ref 19–32)
Calcium: 9.2 mg/dL (ref 8.4–10.5)
Chloride: 96 mEq/L (ref 96–112)
Creatinine, Ser: 0.97 mg/dL (ref 0.50–1.10)
GFR calc Af Amer: 63 mL/min — ABNORMAL LOW (ref >90)
GFR calc non Af Amer: 55 mL/min — ABNORMAL LOW (ref >90)
GLUCOSE: 169 mg/dL — AB (ref 70–99)
POTASSIUM: 4.3 meq/L (ref 3.7–5.3)
Sodium: 131 mEq/L — ABNORMAL LOW (ref 137–147)

## 2014-09-15 LAB — PRO B NATRIURETIC PEPTIDE: PRO B NATRI PEPTIDE: 203.8 pg/mL (ref 0–450)

## 2014-09-15 LAB — TROPONIN I: Troponin I: <0.3 ng/mL (ref <0.30)

## 2014-09-15 LAB — I-STAT TROPONIN, ED: Troponin i, poc: 0.01 ng/mL (ref 0.00–0.08)

## 2014-09-15 MED ORDER — LABETALOL HCL 5 MG/ML IV SOLN
10.0000 mg | Freq: Once | INTRAVENOUS | Status: AC
  Administered 2014-09-15: 10 mg via INTRAVENOUS
  Filled 2014-09-15: qty 4

## 2014-09-15 MED ORDER — LABETALOL HCL 5 MG/ML IV SOLN: 5.0000 mg | Freq: Once | INTRAVENOUS | Status: DC

## 2014-09-15 MED ORDER — HYDRALAZINE HCL 20 MG/ML IJ SOLN: 10.0000 mg | Freq: Four times a day (QID) | INTRAMUSCULAR | Status: DC | PRN

## 2014-09-15 NOTE — ED Notes (Signed)
Pt arrived to the ED with a complaint of shortness of breath.  Pt has had a pace maker installed a month ago.  Pt is anxious due to familiar medical issues and was stressed to return to hospital to take care of her concern when she felt pain at the pacemaker site and became short of breath

## 2014-09-15 NOTE — ED Provider Notes (Signed)
CSN: [id number]     Arrival date & time 09/15/14  2037 History   First MD Initiated Contact with Patient 09/15/14 2038     Chief Complaint  Patient presents with  . Shortness of Breath     (Consider location/radiation/quality/duration/timing/severity/associated sxs/prior Treatment) The history is provided by the patient, a relative and medical records. No language interpreter was used.    Veronica Young is a 80 y.o. female  with a hx of HTN, NIDDM, deafness, GERD, glaucoma, PAD, diastolic heart failure, pacermaker (due to sick sinus syndrome) presents to the Emergency Department complaining of gradual, persistent, progressively worsening SOB and chest pain at the site of her pacemaker onset 30 min PTA.  Pt's daughter reports that their family has been under a lot of stress as pt's daughter is in the hospital right now. Nothing makes the symptoms better or worse.  Pt's family denies URI symptoms.  Pt denies fever, chills, headache, neck pain, cough, abdominal pain, nausea vomiting, diarrhea weakness, dizziness, syncope, dysuria.  PCP: Joneen Caraway, MD   Past Medical History  Diagnosis Date  . Hypertension   . Diabetes mellitus     type 2  . Hypercholesterolemia   . Deafness   . Carotid bruit   . GERD (gastroesophageal reflux disease)   . Glaucoma   . Hypothyroidism   . PAT (paroxysmal atrial tachycardia)   . PAD (peripheral artery disease)   . Diastolic heart failure     EF is 60%  . Normal cardiac stress test January 2014  . Sick sinus syndrome    Past Surgical History  Procedure Laterality Date  . Cholycystectomy    . Total knee arthroplasty      right  . Left breast cyst removed    . Pacemaker insertion  07-26-2014    MDT ADDRL1 pacemaker implanted by Dr Lovena Le for SSS   Family History  Problem Relation Age of Onset  . Heart disease Mother   . Liver cancer Father   . Heart disease Sister   . Heart disease Brother   . Stroke Sister    History  Substance Use  Topics  . Smoking status: Never Smoker   . Smokeless tobacco: Never Used  . Alcohol Use: No   OB History   Grav Para Term Preterm Abortions TAB SAB Ect Mult Living                 Review of Systems  Constitutional: Negative for fever, diaphoresis, appetite change, fatigue and unexpected weight change.  HENT: Negative for mouth sores.   Eyes: Negative for visual disturbance.  Respiratory: Negative for cough, chest tightness, shortness of breath and wheezing.   Cardiovascular: Positive for chest pain (at pacemaker implantation site).  Gastrointestinal: Positive for abdominal pain. Negative for nausea, vomiting, diarrhea and constipation.  Endocrine: Negative for polydipsia, polyphagia and polyuria.  Genitourinary: Negative for dysuria, urgency, frequency and hematuria.  Musculoskeletal: Negative for back pain and neck stiffness.  Skin: Negative for rash.  Allergic/Immunologic: Negative for immunocompromised state.  Neurological: Negative for syncope, light-headedness and headaches.  Hematological: Does not bruise/bleed easily.  Psychiatric/Behavioral: Negative for sleep disturbance. The patient is nervous/anxious.       Allergies  Amlodipine and Benicar  Home Medications   Prior to Admission medications   Medication Sig Start Date End Date Taking? Authorizing Provider  aspirin EC 81 MG tablet Take 81 mg by mouth every evening.    Yes Historical Provider, MD  atorvastatin (LIPITOR) 80 MG tablet  Take 80 mg by mouth at bedtime.    Yes Historical Provider, MD  atropine 1 % ophthalmic solution Place 1 drop into the left eye 2 (two) times daily.    Yes Historical Provider, MD  calcium-vitamin D (OSCAL WITH D) 500-200 MG-UNIT per tablet Take 1 tablet by mouth daily with breakfast.   Yes Historical Provider, MD  cholecalciferol (VITAMIN D) 1000 UNITS tablet Take 1,000 Units by mouth daily.   Yes Historical Provider, MD  cilostazol (PLETAL) 50 MG tablet Take 50 mg by mouth 2 (two) times  daily.   Yes Historical Provider, MD  citalopram (CELEXA) 20 MG tablet Take 20 mg by mouth at bedtime.  02/08/11  Yes Historical Provider, MD  dorzolamide-timolol (COSOPT) 22.3-6.8 MG/ML ophthalmic solution Place 1 drop into both eyes 2 (two) times daily.    Yes Historical Provider, MD  hydrALAZINE (APRESOLINE) 25 MG tablet Take 1 tablet (25 mg total) by mouth 3 (three) times daily. 07/27/14  Yes Rhonda G Barrett, PA-C  imiquimod (ALDARA) 5 % cream Apply 1 application topically daily with breakfast.   Yes Historical Provider, MD  levothyroxine (SYNTHROID, LEVOTHROID) 112 MCG tablet Take 112 mcg by mouth daily before breakfast.  03/31/14  Yes Historical Provider, MD  lisinopril (PRINIVIL,ZESTRIL) 40 MG tablet Take 1 tablet (40 mg total) by mouth daily. 02/16/14  Yes Luke K Kilroy, PA-C  potassium chloride (K-DUR) 10 MEQ tablet Take 10 mEq by mouth once a week. Takes on Monday   Yes Historical Provider, MD   BP 194/86  Pulse 102  Temp(Src) 97.6 F (36.4 C) (Oral)  Resp 19  SpO2 98% Physical Exam  Nursing note and vitals reviewed. Constitutional: She appears well-developed and well-nourished. No distress.  Awake, alert, nontoxic appearance  HENT:  Head: Normocephalic and atraumatic.  Mouth/Throat: Oropharynx is clear and moist. No oropharyngeal exudate.  Eyes: Conjunctivae are normal. No scleral icterus.  Neck: Normal range of motion. Neck supple.  Cardiovascular: Regular rhythm, normal heart sounds and intact distal pulses.   Tachycardia  Pulmonary/Chest: Effort normal and breath sounds normal. No respiratory distress. She has no wheezes.  Equal chest expansion Clear and equal breath sounds Well healed surgical incision for pacemaker placement  Abdominal: Soft. Bowel sounds are normal. She exhibits no mass. There is no tenderness. There is no rebound and no guarding.  Musculoskeletal: Normal range of motion. She exhibits no edema.  Neurological: She is alert. She exhibits normal muscle tone.  Coordination normal.  Speech is clear and goal oriented Moves extremities without ataxia  Skin: Skin is warm and dry. She is not diaphoretic. No erythema.  Psychiatric: She has a normal mood and affect.    ED Course  Procedures (including critical care time) Labs Review Labs Reviewed  CBC - Abnormal; Notable for the following:    HCT 34.8 (*)    All other components within normal limits  BASIC METABOLIC PANEL - Abnormal; Notable for the following:    Sodium 131 (*)    Glucose, Bld 169 (*)    GFR calc non Af Amer 55 (*)    GFR calc Af Amer 63 (*)    All other components within normal limits  PRO B NATRIURETIC PEPTIDE  TROPONIN I  I-STAT TROPOININ, ED    Imaging Review Dg Chest 2 View  09/15/2014   CLINICAL DATA:  Chest pain, shortness of breath  EXAM: CHEST  2 VIEW  COMPARISON:  July 27, 2014  FINDINGS: The heart size and mediastinal contours are stable.  Cardiac pacemaker is unchanged. There is no focal infiltrate, pulmonary edema, or pleural effusion. The visualized skeletal structures are stable.  IMPRESSION: No active cardiopulmonary disease.   Electronically Signed   By: Abelardo Diesel M.D.   On: 09/15/2014 21:35     EKG Interpretation None      Echo 8/15:  Impressions: Normal biventricular size and function. Grade 2 diastolic dysfunction with normal filling pressures. Moderate pulmonary hypertension.  Mild mitral and tricuspid regurgitation.   MDM   Final diagnoses:  Hypertensive urgency  Dyspnea  Chronic diastolic CHF (congestive heart failure)  PAD (peripheral artery disease)  Essential hypertension  Chest pain, unspecified chest pain type    Veronica Young presents with SOB, CP at the site of her pacemaker and anxiety.  Pt hypertensive and tachycardic without hypoxia.  No rales or rhonchi to suggest CHF or PNA.  Pt afebrile.  Will obtain labs, x-ray and reassess.  Family that I reports that there was a stressful family event and patient was rushing to get  the hospital when the symptoms began.  She is tachypneic, tachycardic and visibly stressed.   10:00 PM BP: 194/86 HR: 101 Pt reports resolution of symptoms; symptoms lasted less than one hour.   10:50 PM Pt with continued resolution of symptoms.  Discussed with Cardiology Fransico Him, MD) who recommends overnight admission by the hospitalist.  [facility] admit for troponin trending and hypertensive urgency. Patient given labetalol here in the emergency department.  BP 194/86  Pulse 102  Temp(Src) 97.6 F (36.4 C) (Oral)  Resp 19  SpO2 98%   11:10 PM BP: 177/79  Discussed with Dr. Charlies Silvers who will admit to Tele.  The patient was discussed with and seen by Dr. Regenia Skeeter who agrees with the treatment plan.    Jarrett Soho Peg Fifer, PA-C 09/15/14 2310

## 2014-09-15 NOTE — H&P (Signed)
[facility] History and Physical  Veronica Young [id number] DOB: 1933-08-04 DOA: 09/15/2014  Referring physician: ER physician PCP: Saralyn Pilar   Chief Complaint: pain at the pacemaker site   HPI:  80 year old female with past medical history of hypertension, deafness, diastolic hearth failure, status post pacemaker placement about 2 months prior to this admission who presented to [facility] ED 09/15/2014 with very brief episode of pain at the pacemaker site and associated shortness of breath. Patient feels fine at this moment. She has no shortness of breath. The pain has completely resolved. She also reports she did not experience chest pain prior to this admission. No palpitations. No abdominal pain, nausea or vomiting. No lightheadedness or loss of consciousness. No dizziness. No reports of diarrhea or constipation. No reports of blood in the stool or urine. No blurry vision or headaches. In ED vital signs were as follows: Blood pressure as high as 218/80, heart rate 84, T max 97.6 F and oxygen saturation 96% on room air. Blood work was unremarkable. The initial troponin level was within normal limits. Chest x-ray did not show acute cardiopulmonary findings. ED MD spoke with Dr. Fransico Him who recommended admission for hypertensive urgency dn cycling cardiac enzymes.   Assessment & Plan    Principal Problem:   Hypertensive urgency  Initial blood pressure 218/80. Patient was given labetalol 10 mg IV and current blood pressure is 177/81.  Resume home blood pressure medications.  Admission to telemetry for further monitoring. Active Problems:   Hypercholesterolemia  Resume statin therapy   Hypothyroidism  Continue synthroid  Check TSH   Claudication / PAD (peripheral artery disease)  Continue pletal and aspirin    Chronic diastolic CHF (congestive heart failure)  Compensated. Last 2-D echo in August 2015 with essentially unremarkable ejection fraction. BNP on this  admission 203.  Continue hydralazine, lisinopril  Replete electrolytes. Continue aspirin and statin therapy.  DVT prophylaxis:   SCD's bilaterally   Radiological Exams on Admission: Dg Chest 2 View  09/15/2014   IMPRESSION: No active cardiopulmonary disease.   Electronically Signed   By: Abelardo Diesel M.D.   On: 09/15/2014 21:35     Code Status: Full Family Communication: Plan of care discussed with the patient  Disposition Plan: Admit for further evaluation  Leisa Lenz, MD  [facility] Pager [phone]  Review of Systems:  Constitutional: Negative for fever, chills and malaise/fatigue. Negative for diaphoresis.  HENT: Negative for hearing loss, ear pain, nosebleeds, congestion, sore throat, neck pain, tinnitus and ear discharge.   Eyes: Negative for blurred vision, double vision, photophobia, pain, discharge and redness.  Respiratory: Negative for cough, hemoptysis, sputum production, shortness of breath, wheezing and stridor.   Cardiovascular: Negative for chest pain, palpitations, orthopnea, claudication and leg swelling.  Gastrointestinal: Negative for nausea, vomiting and abdominal pain. Negative for heartburn, constipation, blood in stool and melena.  Genitourinary: Negative for dysuria, urgency, frequency, hematuria and flank pain.  Musculoskeletal: Negative for myalgias, back pain, joint pain and falls.  Skin: Negative for itching and rash.  Neurological: Negative for dizziness and weakness. Negative for tingling, tremors, sensory change, speech change, focal weakness, loss of consciousness and headaches.  Endo/Heme/Allergies: Negative for environmental allergies and polydipsia. Does not bruise/bleed easily.  Psychiatric/Behavioral: Negative for suicidal ideas. The patient is not nervous/anxious.      Past Medical History  Diagnosis Date  . Hypertension   . Diabetes mellitus     type 2  . Hypercholesterolemia   . Deafness   .  Carotid bruit   . GERD  (gastroesophageal reflux disease)   . Glaucoma   . Hypothyroidism   . PAT (paroxysmal atrial tachycardia)   . PAD (peripheral artery disease)   . Diastolic heart failure     EF is 60%  . Normal cardiac stress test January 2014  . Sick sinus syndrome    Past Surgical History  Procedure Laterality Date  . Cholycystectomy    . Total knee arthroplasty      right  . Left breast cyst removed    . Pacemaker insertion  07-26-2014    MDT ADDRL1 pacemaker implanted by Dr Lovena Le for SSS   Social History:  reports that she has never smoked. She has never used smokeless tobacco. She reports that she does not drink alcohol or use illicit drugs.  Allergies  Allergen Reactions  . Amlodipine Other (See Comments)    Nausea  . Benicar [Olmesartan Medoxomil] Other (See Comments)    Reaction unknown    Family History:  Family History  Problem Relation Age of Onset  . Heart disease Mother   . Liver cancer Father   . Heart disease Sister   . Heart disease Brother   . Stroke Sister      Prior to Admission medications   Medication Sig Start Date End Date Taking? Authorizing Provider  aspirin EC 81 MG tablet Take 81 mg by mouth every evening.    Yes Historical Provider, MD  atorvastatin (LIPITOR) 80 MG tablet Take 80 mg by mouth at bedtime.    Yes Historical Provider, MD  atropine 1 % ophthalmic solution Place 1 drop into the left eye 2 (two) times daily.    Yes Historical Provider, MD  calcium-vitamin D (OSCAL WITH D) 500-200 MG-UNIT per tablet Take 1 tablet by mouth daily with breakfast.   Yes Historical Provider, MD  cholecalciferol (VITAMIN D) 1000 UNITS tablet Take 1,000 Units by mouth daily.   Yes Historical Provider, MD  cilostazol (PLETAL) 50 MG tablet Take 50 mg by mouth 2 (two) times daily.   Yes Historical Provider, MD  citalopram (CELEXA) 20 MG tablet Take 20 mg by mouth at bedtime.  02/08/11  Yes Historical Provider, MD  dorzolamide-timolol (COSOPT) 22.3-6.8 MG/ML ophthalmic solution  Place 1 drop into both eyes 2 (two) times daily.    Yes Historical Provider, MD  hydrALAZINE (APRESOLINE) 25 MG tablet Take 1 tablet (25 mg total) by mouth 3 (three) times daily. 07/27/14  Yes Rhonda G Barrett, PA-C  imiquimod (ALDARA) 5 % cream Apply 1 application topically daily with breakfast.   Yes Historical Provider, MD  levothyroxine (SYNTHROID, LEVOTHROID) 112 MCG tablet Take 112 mcg by mouth daily before breakfast.  03/31/14  Yes Historical Provider, MD  lisinopril (PRINIVIL,ZESTRIL) 40 MG tablet Take 1 tablet (40 mg total) by mouth daily. 02/16/14  Yes Luke K Kilroy, PA-C  potassium chloride (K-DUR) 10 MEQ tablet Take 10 mEq by mouth once a week. Takes on Monday   Yes Historical Provider, MD   Physical Exam: Filed Vitals:   09/15/14 2046 09/15/14 2200  BP: 218/80 194/86  Pulse: 107 102  Temp: 97.6 F (36.4 C)   TempSrc: Oral   Resp: 20 19  SpO2: 96% 98%    Physical Exam  Constitutional: Appears well-developed and well-nourished. No distress.  HENT: Normocephalic. No tonsillar erythema or exudates Eyes: Conjunctivae and EOM are normal. PERRLA, no scleral icterus.  Neck: Normal ROM. Neck supple. No JVD. No tracheal deviation. No thyromegaly.  CVS: RRR,  S1/S2 +, has pacemaker Pulmonary: Effort and breath sounds normal, no stridor, rhonchi, wheezes, rales.  Abdominal: Soft. BS +,  no distension, tenderness, rebound or guarding.  Musculoskeletal: Normal range of motion. No edema and no tenderness.  Lymphadenopathy: No lymphadenopathy noted, cervical, inguinal. Neuro: Alert. Normal reflexes, muscle tone coordination. No focal neurologic deficits. Skin: Skin is warm and dry. No rash noted. Not diaphoretic. No erythema. No pallor.  Psychiatric: Normal mood and affect. Behavior, judgment, thought content normal.   Labs on Admission:  Basic Metabolic Panel:  Recent Labs Lab 09/15/14 2123  NA 131*  K 4.3  CL 96  CO2 22  GLUCOSE 169*  BUN 16  CREATININE 0.97  CALCIUM 9.2    Liver Function Tests: No results found for this basename: AST, ALT, ALKPHOS, BILITOT, PROT, ALBUMIN,  in the last 168 hours No results found for this basename: LIPASE, AMYLASE,  in the last 168 hours No results found for this basename: AMMONIA,  in the last 168 hours CBC:  Recent Labs Lab 09/15/14 2123  WBC 4.7  HGB 12.1  HCT 34.8*  MCV 83.3  PLT 171   Cardiac Enzymes:  Recent Labs Lab 09/15/14 2123  TROPONINI <0.30   BNP: No components found with this basename: POCBNP,  CBG: No results found for this basename: GLUCAP,  in the last 168 hours  If 7PM-7AM, please contact night-coverage [email or web address] Password [email or web address] 09/15/2014, 11:13 PM

## 2014-09-16 LAB — TROPONIN I: Troponin I: <0.3 ng/mL (ref <0.30)

## 2014-09-16 LAB — CBC
HEMATOCRIT: 30.8 % — AB (ref 36.0–46.0)
HEMOGLOBIN: 10.4 g/dL — AB (ref 12.0–15.0)
MCH: 28.5 pg (ref 26.0–34.0)
MCHC: 33.8 g/dL (ref 30.0–36.0)
MCV: 84.4 fL (ref 78.0–100.0)
Platelets: 149 10*3/uL — ABNORMAL LOW (ref 150–400)
RBC: 3.65 MIL/uL — ABNORMAL LOW (ref 3.87–5.11)
RDW: 15.1 % (ref 11.5–15.5)
WBC: 4.4 10*3/uL (ref 4.0–10.5)

## 2014-09-16 LAB — TSH: TSH: 0.753 u[IU]/mL (ref 0.350–4.500)

## 2014-09-16 MED ORDER — LISINOPRIL 40 MG PO TABS
40.0000 mg | ORAL_TABLET | Freq: Every day | ORAL | Status: DC
  Administered 2014-09-16: 40 mg via ORAL
  Filled 2014-09-16: qty 1

## 2014-09-16 MED ORDER — CALCIUM CARBONATE-VITAMIN D 500-200 MG-UNIT PO TABS
1.0000 | ORAL_TABLET | Freq: Every day | ORAL | Status: DC
  Administered 2014-09-16: 1 via ORAL
  Filled 2014-09-16: qty 1

## 2014-09-16 MED ORDER — SODIUM CHLORIDE 0.9 % IV SOLN: INTRAVENOUS | Status: DC

## 2014-09-16 MED ORDER — ATROPINE SULFATE 1 % OP SOLN
1.0000 [drp] | Freq: Two times a day (BID) | OPHTHALMIC | Status: DC
  Administered 2014-09-16 (×2): 1 [drp] via OPHTHALMIC
  Filled 2014-09-16: qty 2

## 2014-09-16 MED ORDER — SODIUM CHLORIDE 0.9 % IJ SOLN
3.0000 mL | Freq: Two times a day (BID) | INTRAMUSCULAR | Status: DC
  Administered 2014-09-16: 3 mL via INTRAVENOUS

## 2014-09-16 MED ORDER — DORZOLAMIDE HCL-TIMOLOL MAL 2-0.5 % OP SOLN
1.0000 [drp] | Freq: Two times a day (BID) | OPHTHALMIC | Status: DC
  Administered 2014-09-16 (×2): 1 [drp] via OPHTHALMIC
  Filled 2014-09-16: qty 10

## 2014-09-16 MED ORDER — CILOSTAZOL 50 MG PO TABS
50.0000 mg | ORAL_TABLET | Freq: Two times a day (BID) | ORAL | Status: DC
  Administered 2014-09-16 (×2): 50 mg via ORAL
  Filled 2014-09-16 (×2): qty 1

## 2014-09-16 MED ORDER — ATORVASTATIN CALCIUM 80 MG PO TABS
80.0000 mg | ORAL_TABLET | Freq: Every day | ORAL | Status: DC
  Administered 2014-09-16: 80 mg via ORAL
  Filled 2014-09-16: qty 1

## 2014-09-16 MED ORDER — ONDANSETRON HCL 4 MG PO TABS: 4.0000 mg | ORAL_TABLET | Freq: Four times a day (QID) | ORAL | Status: DC | PRN

## 2014-09-16 MED ORDER — VITAMIN D3 25 MCG (1000 UNIT) PO TABS
1000.0000 [IU] | ORAL_TABLET | Freq: Every day | ORAL | Status: DC
  Administered 2014-09-16: 1000 [IU] via ORAL
  Filled 2014-09-16: qty 1

## 2014-09-16 MED ORDER — ACETAMINOPHEN 650 MG RE SUPP: 650.0000 mg | Freq: Four times a day (QID) | RECTAL | Status: DC | PRN

## 2014-09-16 MED ORDER — ACETAMINOPHEN 325 MG PO TABS: 650.0000 mg | ORAL_TABLET | Freq: Four times a day (QID) | ORAL | Status: DC | PRN

## 2014-09-16 MED ORDER — ASPIRIN EC 81 MG PO TBEC
81.0000 mg | DELAYED_RELEASE_TABLET | Freq: Every evening | ORAL | Status: DC
  Filled 2014-09-16: qty 1

## 2014-09-16 MED ORDER — LEVOTHYROXINE SODIUM 112 MCG PO TABS
112.0000 ug | ORAL_TABLET | Freq: Every day | ORAL | Status: DC
  Administered 2014-09-16: 112 ug via ORAL
  Filled 2014-09-16: qty 1

## 2014-09-16 MED ORDER — HYDRALAZINE HCL 25 MG PO TABS
25.0000 mg | ORAL_TABLET | Freq: Three times a day (TID) | ORAL | Status: DC
  Administered 2014-09-16: 25 mg via ORAL
  Filled 2014-09-16 (×2): qty 1

## 2014-09-16 MED ORDER — POTASSIUM CHLORIDE ER 10 MEQ PO TBCR: 10.0000 meq | EXTENDED_RELEASE_TABLET | ORAL | Status: DC

## 2014-09-16 MED ORDER — IMIQUIMOD 5 % EX CREA: 1.0000 "application " | TOPICAL_CREAM | Freq: Every day | CUTANEOUS | Status: DC

## 2014-09-16 MED ORDER — ONDANSETRON HCL 4 MG/2ML IJ SOLN: 4.0000 mg | Freq: Four times a day (QID) | INTRAMUSCULAR | Status: DC | PRN

## 2014-09-16 MED ORDER — CITALOPRAM HYDROBROMIDE 20 MG PO TABS
20.0000 mg | ORAL_TABLET | Freq: Every day | ORAL | Status: DC
  Administered 2014-09-16: 20 mg via ORAL
  Filled 2014-09-16: qty 1

## 2014-09-16 NOTE — Care Management Note (Signed)
    Page 1 of 1   09/16/2014     1:52:45 PM CARE MANAGEMENT NOTE 09/16/2014  Patient:  Veronica Young, Veronica Young   Account Number:  [id number]  Date Initiated:  09/16/2014  Documentation initiated by:  [facility]  Subjective/Objective Assessment:   80 [place].     Action/Plan:   FROM HOME.   Anticipated DC Date:  09/16/2014   Anticipated DC Plan:  [place]  CM consult      Choice offered to / List presented to:             Status of service:  Completed, signed off Medicare Important Message given?   (If response is "NO", the following Medicare IM given date fields will be blank) Date Medicare IM given:   Medicare IM given by:   Date Additional Medicare IM given:   Additional Medicare IM given by:    Discharge Disposition:  HOME/SELF CARE  Per UR Regulation:  Reviewed for med. necessity/level of care/duration of stay  If discussed at [place] of Stay Meetings, dates discussed:    Comments:  09/16/14 Saphire Barnhart RN,BSN NCM [phone] D/C [place].

## 2014-09-16 NOTE — Discharge Summary (Signed)
Physician Discharge Summary  Veronica Young [id number] DOB: [birth year] DOA: 09/15/2014  PCP: Veronica Young  Admit date: 09/15/2014 Discharge date: 09/16/2014  Time spent: 35 minutes  Recommendations for Outpatient Follow-up:  1. Follow up with PCP in 1 week   Discharge Diagnoses:  Principal Problem:   Hypertensive urgency Active Problems:   Hypercholesterolemia   GERD (gastroesophageal reflux disease)   Hypothyroidism   Claudication   PAD (peripheral artery disease)   Chronic diastolic CHF (congestive heart failure)  Discharge Condition: stable  Diet recommendation: heart healthy  Filed Weights   09/16/14 0010  Weight: 69.536 kg (153 lb 4.8 oz)   History of present illness:  80 year old female with past medical history of hypertension, deafness, diastolic hearth failure, status post pacemaker placement about 2 months prior to this admission who presented to [facility] ED 09/15/2014 with very brief episode of pain at the pacemaker site and associated shortness of breath. Patient feels fine at this moment. She has no shortness of breath. The pain has completely resolved. She also reports she did not experience chest pain prior to this admission. No palpitations. No abdominal pain, nausea or vomiting. No lightheadedness or loss of consciousness. No dizziness. No reports of diarrhea or constipation. No reports of blood in the stool or urine. No blurry vision or headaches. In ED vital signs were as follows: Blood pressure as high as 218/80, heart rate 84, T max 97.6 F and oxygen saturation 96% on room air. Blood work was unremarkable. The initial troponin level was within normal limits. Chest x-ray did not show acute cardiopulmonary findings. ED MD spoke with Dr. Fransico Young who recommended admission for hypertensive urgency dn cycling cardiac enzymes.   Hospital Course:  Hypertensive urgency  Initial blood pressure 218/80. Patient was given labetalol 10 mg IV and his home oral  regimen was resumed with good control. Her BP on the morning of discharge was 126/59 and repeat 139/65.  Patient denies having chest pain prior to admission. On the day of discharge she was chest pain free, asymptomatic. Her cardiac enzymes were cycled and were negative, EKG non ischemic.  Hypercholesterolemia  Resume statin therapy Hypothyroidism  Continue synthroid  TSH within normal limits  Claudication / PAD (peripheral artery disease)  Continue pletal and aspirin  Chronic diastolic CHF (congestive heart failure)  Compensated. Last 2-D echo in August 2015 with essentially unremarkable ejection fraction. BNP on this admission 203.  Continue hydralazine, lisinopril  Replete electrolytes. Continue aspirin and statin therapy.  Procedures:  None    Consultations:  None   Discharge Exam: Filed Vitals:   09/16/14 0010 09/16/14 0637 09/16/14 0640 09/16/14 1014  BP: 167/67  126/59 139/65  Pulse: 84 63 76   Temp: 98.2 F (36.8 C) 97.8 F (36.6 C) 97.8 F (36.6 C)   TempSrc: Oral Oral Oral   Resp: 18 1 18    Height: 5\' 6"  (1.676 m)     Weight: 69.536 kg (153 lb 4.8 oz)     SpO2: 100%  91%    General: NAD Cardiovascular: RRr Respiratory: CTA biL  Discharge Instructions    Medication List         aspirin EC 81 MG tablet  Take 81 mg by mouth every evening.     atorvastatin 80 MG tablet  Commonly known as:  LIPITOR  Take 80 mg by mouth at bedtime.     atropine 1 % ophthalmic solution  Place 1 drop into the left eye 2 (two)  times daily.     calcium-vitamin D 500-200 MG-UNIT per tablet  Commonly known as:  OSCAL WITH D  Take 1 tablet by mouth daily with breakfast.     cholecalciferol 1000 UNITS tablet  Commonly known as:  VITAMIN D  Take 1,000 Units by mouth daily.     cilostazol 50 MG tablet  Commonly known as:  PLETAL  Take 50 mg by mouth 2 (two) times daily.     citalopram 20 MG tablet  Commonly known as:  CELEXA  Take 20 mg by mouth at bedtime.      dorzolamide-timolol 22.3-6.8 MG/ML ophthalmic solution  Commonly known as:  COSOPT  Place 1 drop into both eyes 2 (two) times daily.     hydrALAZINE 25 MG tablet  Commonly known as:  APRESOLINE  Take 1 tablet (25 mg total) by mouth 3 (three) times daily.     imiquimod 5 % cream  Commonly known as:  ALDARA  Apply 1 application topically daily with breakfast.     levothyroxine 112 MCG tablet  Commonly known as:  SYNTHROID, LEVOTHROID  Take 112 mcg by mouth daily before breakfast.     lisinopril 40 MG tablet  Commonly known as:  PRINIVIL,ZESTRIL  Take 1 tablet (40 mg total) by mouth daily.     potassium chloride 10 MEQ tablet  Commonly known as:  K-DUR  Take 10 mEq by mouth once a week. Takes on Monday           Follow-up Information   Follow up with Veronica Young. Schedule an appointment as soon as possible for a visit in 2 weeks.   Specialty:  Family Medicine   Contact information:   [address] [place] [address] [phone]       The results of significant diagnostics from this hospitalization (including imaging, microbiology, ancillary and laboratory) are listed below for reference.    Significant Diagnostic Studies: Dg Chest 2 View  09/15/2014   CLINICAL DATA:  Chest pain, shortness of breath  EXAM: CHEST  2 VIEW  COMPARISON:  July 27, 2014  FINDINGS: The heart size and mediastinal contours are stable. Cardiac pacemaker is unchanged. There is no focal infiltrate, pulmonary edema, or pleural effusion. The visualized skeletal structures are stable.  IMPRESSION: No active cardiopulmonary disease.   Electronically Signed   By: Veronica Young M.D.   On: 09/15/2014 21:35   Labs: Basic Metabolic Panel:  Recent Labs Lab 09/15/14 2123  NA 131*  K 4.3  CL 96  CO2 22  GLUCOSE 169*  BUN 16  CREATININE 0.97  CALCIUM 9.2   CBC:  Recent Labs Lab 09/15/14 2123 09/16/14 0626  WBC 4.7 4.4  HGB 12.1 10.4*  HCT 34.8* 30.8*  MCV 83.3 84.4   PLT 171 149*   Cardiac Enzymes:  Recent Labs Lab 09/15/14 2123 09/16/14 0040 09/16/14 0626  TROPONINI <0.30 <0.30 <0.30   BNP: BNP (last 3 results)  Recent Labs  07/25/14 1057 09/15/14 2123  PROBNP 578.9* 203.8    Signed:  Charnette Young  [facility] 09/16/2014, 2:34 PM

## 2014-09-16 NOTE — Progress Notes (Signed)
Interpreter present to explain discharge instructions.

## 2014-09-16 NOTE — Discharge Instructions (Signed)

## 2014-09-19 LAB — GLUCOSE, CAPILLARY: Glucose-Capillary: 106 mg/dL — ABNORMAL HIGH (ref 70–99)

## 2014-09-20 NOTE — ED Provider Notes (Signed)
Medical screening examination/treatment/procedure(s) were conducted as a shared visit with non-physician practitioner(s) and myself.  I personally evaluated the patient during the encounter.   EKG Interpretation   Date/Time:  Thursday September 15 2014 20:44:43 EDT Ventricular Rate:  109 PR Interval:  174 QRS Duration: 92 QT Interval:  334 QTC Calculation: 450 R Axis:   57 Text Interpretation:  Sinus tachycardia Atrial premature complex Probable  left atrial enlargement Borderline ST depression, diffuse leads ED  PHYSICIAN INTERPRETATION AVAILABLE IN [facility] Confirmed by TEST,  Record ([id number]) on 09/18/2014 8:42:15 AM       Patient with atypical chest pain shortness of breath associated with hypertension. Symptoms resolved upon my exam. Concerning history based on her multiple risk factors, will admit for ACS rule out.  Ephraim Hamburger, MD 09/20/14 [id number]

## 2014-09-22 ENCOUNTER — Encounter: Payer: Self-pay | Admitting: Cardiology

## 2014-09-22 ENCOUNTER — Ambulatory Visit (INDEPENDENT_AMBULATORY_CARE_PROVIDER_SITE_OTHER): Payer: Medicare HMO | Admitting: Cardiology

## 2014-09-22 VITALS — BP 152/74 | HR 66 | Ht 66.0 in | Wt 154.0 lb

## 2014-09-22 DIAGNOSIS — I739 Peripheral vascular disease, unspecified: Secondary | ICD-10-CM | POA: Diagnosis not present

## 2014-09-22 DIAGNOSIS — I10 Essential (primary) hypertension: Secondary | ICD-10-CM | POA: Insufficient documentation

## 2014-09-22 DIAGNOSIS — E7800 Pure hypercholesterolemia, unspecified: Secondary | ICD-10-CM

## 2014-09-22 NOTE — Progress Notes (Signed)
Veronica Young Date of Birth: 04/08/1933 Medical Record [id number]  History of Present Illness: Veronica Young is seen back today for a follow up visit. She is seen with a sign language interpreter. She has HLD, glaucoma, hypothyroidism, PVD, labile HTN, DM and she is deaf. She has diastolic heart failure (grade 2 by echo). Since I last saw her she was admitted in August with profound bradycardia that persisted despite holding metoprolol. She had a pacemaker place. Her hydralazine was increased. She was admitted overnight last week with severe HTN. BP normalized quickly and she was DC on same meds. On follow up today she is feeling very well. She notes marked improvement since pacemaker was placed. Energy level and claudication improved. No dizziness. She reports BP 128/74 at home yesterday.  Current Outpatient Prescriptions  Medication Sig Dispense Refill  . aspirin EC 81 MG tablet Take 81 mg by mouth every evening.       [name] atorvastatin (LIPITOR) 80 MG tablet Take 80 mg by mouth at bedtime.       [name] atropine 1 % ophthalmic solution Place 1 drop into the left eye 2 (two) times daily.       . calcium-vitamin D (OSCAL WITH D) 500-200 MG-UNIT per tablet Take 1 tablet by mouth daily with breakfast.      . cholecalciferol (VITAMIN D) 1000 UNITS tablet Take 1,000 Units by mouth daily.      . cilostazol (PLETAL) 50 MG tablet Take 50 mg by mouth 2 (two) times daily.      . citalopram (CELEXA) 20 MG tablet Take 20 mg by mouth at bedtime.       . dorzolamide-timolol (COSOPT) 22.3-6.8 MG/ML ophthalmic solution Place 1 drop into both eyes 2 (two) times daily.       . hydrALAZINE (APRESOLINE) 25 MG tablet Take 1 tablet (25 mg total) by mouth 3 (three) times daily.  90 tablet  11  . imiquimod (ALDARA) 5 % cream Apply 1 application topically daily with breakfast.      . levothyroxine (SYNTHROID, LEVOTHROID) 112 MCG tablet Take 112 mcg by mouth daily before breakfast.       . lisinopril (PRINIVIL,ZESTRIL) 40 MG  tablet Take 1 tablet (40 mg total) by mouth daily.  90 tablet  3  . potassium chloride (K-DUR) 10 MEQ tablet Take 10 mEq by mouth once a week. Takes on Monday       No current facility-administered medications for this visit.    Allergies  Allergen Reactions  . Amlodipine Other (See Comments)    Nausea  . Benicar [Olmesartan Medoxomil] Other (See Comments)    Reaction unknown    Past Medical History  Diagnosis Date  . Hypertension   . Diabetes mellitus     type 2  . Hypercholesterolemia   . Deafness   . Carotid bruit   . GERD (gastroesophageal reflux disease)   . Glaucoma   . Hypothyroidism   . PAT (paroxysmal atrial tachycardia)   . PAD (peripheral artery disease)   . Diastolic heart failure     EF is 60%  . Normal cardiac stress test January 2014  . Sick sinus syndrome     Past Surgical History  Procedure Laterality Date  . Cholycystectomy    . Total knee arthroplasty      right  . Left breast cyst removed    . Pacemaker insertion  07-26-2014    MDT ADDRL1 pacemaker implanted by Dr Lovena Le for SSS  History  Smoking status  . Never Smoker   Smokeless tobacco  . Never Used    History  Alcohol Use No    Family History  Problem Relation Age of Onset  . Heart disease Mother   . Liver cancer Father   . Heart disease Sister   . Heart disease Brother   . Stroke Sister     Review of Systems: The review of systems is per the HPI.  All other systems were reviewed and are negative.  Physical Exam: BP 152/74  Pulse 66  Ht 5\' 6"  (1.676 m)  Wt 154 lb (69.854 kg)  BMI 24.87 kg/m2 Patient is very pleasant and in no acute distress. Skin is warm and dry. Color is normal.  HEENT is unremarkable. Normocephalic/atraumatic. PERRL. Sclera are nonicteric. She does have a skin lesion on her left cheek that is red and raised. Neck is supple. No masses. No JVD. Lungs are clear. Cardiac exam shows a regular rate and rhythm. Abdomen is soft. Extremities are without edema.  Gait and ROM are intact. No gross neurologic deficits noted.  Wt Readings from Last 3 Encounters:  09/22/14 154 lb (69.854 kg)  09/16/14 153 lb 4.8 oz (69.536 kg)  07/27/14 147 lb 11.3 oz (67 kg)     LABORATORY DATA: PENDING   Lab Results  Component Value Date   WBC 4.4 09/16/2014   HGB 10.4* 09/16/2014   HCT 30.8* 09/16/2014   PLT 149* 09/16/2014   GLUCOSE 169* 09/15/2014   ALT 18 11/22/2013   AST 21 11/22/2013   NA 131* 09/15/2014   K 4.3 09/15/2014   CL 96 09/15/2014   CREATININE 0.97 09/15/2014   BUN 16 09/15/2014   CO2 22 09/15/2014   TSH 0.753 09/16/2014   INR 1.91* 07/11/2011   HGBA1C 6.3 11/22/2013     Assessment / Plan: 1. HTN - labile.  Will continue dose of hydralazine and lisinopril.  Previously she felt bad and had orthostatic symptoms with higher doses but this may have been increased too quickly. Keep diary and call if BP remains elevated. If so I would increase her hydralazine. I would avoid beta blocker even with pacemaker in place since this may have aggravated her claudication. I will follow up in 3 months.  2. Diastolic HF - her weight is stable - looks compensated to me. Using Lasix just prn.   3. PVD - followed by Dr. Angelena Form  4. Skin lesion. Seeing dermatology.

## 2014-09-22 NOTE — Patient Instructions (Signed)
Continue your current therapy  If your blood pressure is staying high call me.  I will see you in 3 months

## 2014-10-28 ENCOUNTER — Encounter: Payer: Self-pay | Admitting: Cardiovascular Disease

## 2014-10-28 ENCOUNTER — Ambulatory Visit (INDEPENDENT_AMBULATORY_CARE_PROVIDER_SITE_OTHER): Payer: Medicare HMO | Admitting: Cardiovascular Disease

## 2014-10-28 VITALS — BP 170/80 | HR 74 | Ht 66.0 in | Wt 154.0 lb

## 2014-10-28 DIAGNOSIS — I739 Peripheral vascular disease, unspecified: Secondary | ICD-10-CM

## 2014-10-28 DIAGNOSIS — I10 Essential (primary) hypertension: Secondary | ICD-10-CM

## 2014-10-28 NOTE — Progress Notes (Signed)
History of Present Illness: 80 yo WF with history of HTN, DM, HLD, deafness, GERD, hypothyroidism who is here today for PV follow up. She is followed in our cardiology office by Dr. Peter [name]. In regards to her PAD, she had a CT angiogram of the distal aorta and bilateral lower extremities which showed bilateral popliteal artery occlusions. She does not have flow limiting disease in the iliacs, common femorals or proximal or mid segments of bilateral SFA. We have elected for conservative management. Carotid artery dopplers in 2010 with mild bilateral carotid artery disease. Last lower ext studies November 2014 ABI 0.41 on the right and 0.46 on the left.   She is here for f/u and feels great. She has no leg pain with walking. No rest pain during day or ulcerations. Some pain across shins at night while in bed.    Primary Care Physician: Joneen Caraway  Past Medical History  Diagnosis Date  . Hypertension   . Diabetes mellitus     type 2  . Hypercholesterolemia   . Deafness   . Carotid bruit   . GERD (gastroesophageal reflux disease)   . Glaucoma   . Hypothyroidism   . PAT (paroxysmal atrial tachycardia)   . PAD (peripheral artery disease)   . Diastolic heart failure     EF is 60%  . Normal cardiac stress test January 2014  . Sick sinus syndrome     Past Surgical History  Procedure Laterality Date  . Cholycystectomy    . Total knee arthroplasty      right  . Left breast cyst removed    . Pacemaker insertion  07-26-2014    MDT ADDRL1 pacemaker implanted by Dr Lovena Le for SSS    Current Outpatient Prescriptions  Medication Sig Dispense Refill  . aspirin EC 81 MG tablet Take 81 mg by mouth every evening.     [name] atorvastatin (LIPITOR) 80 MG tablet Take 80 mg by mouth at bedtime.     [name] atropine 1 % ophthalmic solution Place 1 drop into the left eye 2 (two) times daily.     . calcium-vitamin D (OSCAL WITH D) 500-200 MG-UNIT per tablet Take 1 tablet by mouth daily with breakfast.     . cholecalciferol (VITAMIN D) 1000 UNITS tablet Take 1,000 Units by mouth daily.    . cilostazol (PLETAL) 50 MG tablet Take 50 mg by mouth 2 (two) times daily.    . citalopram (CELEXA) 20 MG tablet Take 20 mg by mouth at bedtime.     . dorzolamide-timolol (COSOPT) 22.3-6.8 MG/ML ophthalmic solution Place 1 drop into both eyes 2 (two) times daily.     . hydrALAZINE (APRESOLINE) 25 MG tablet Take 1 tablet (25 mg total) by mouth 3 (three) times daily. 90 tablet 11  . imiquimod (ALDARA) 5 % cream Apply topically.    [name] levothyroxine (SYNTHROID, LEVOTHROID) 112 MCG tablet Take 112 mcg by mouth daily before breakfast.     . lisinopril (PRINIVIL,ZESTRIL) 40 MG tablet Take 1 tablet (40 mg total) by mouth daily. 90 tablet 3  . potassium chloride (K-DUR) 10 MEQ tablet Take 10 mEq by mouth once a week. Takes on Monday     No current facility-administered medications for this visit.    Allergies  Allergen Reactions  . Amlodipine Other (See Comments) and Nausea Only    Nausea Nausea  . Benicar [Olmesartan Medoxomil] Other (See Comments)    Reaction unknown  . Olmesartan Other (See Comments)    Reaction  unknown    History   Social History  . Marital Status: Married    Spouse Name: N/A    Number of Children: 4  . Years of Education: N/A   Occupational History  . Not on file.   Social History Main Topics  . Smoking status: Never Smoker   . Smokeless tobacco: Never Used  . Alcohol Use: No  . Drug Use: No  . Sexual Activity: No   Other Topics Concern  . Not on file   Social History Narrative    Family History  Problem Relation Age of Onset  . Heart disease Mother   . Liver cancer Father   . Heart disease Sister   . Heart disease Brother   . Stroke Sister     Review of Systems:  As stated in the HPI and otherwise negative.   BP 170/80 mmHg  Pulse 74  Ht 5\' 6"  (1.676 m)  Wt 154 lb (69.854 kg)  BMI 24.87 kg/m2  SpO2 96%  Physical Examination: General: Well developed,  well nourished, NAD HEENT: OP clear, mucus membranes moist SKIN: warm, dry. No rashes. Neuro: No focal deficits Musculoskeletal: Muscle strength 5/5 all ext Psychiatric: Mood and affect normal Neck: No JVD, no carotid bruits, no thyromegaly, no lymphadenopathy. Lungs:Clear bilaterally, no wheezes, rhonci, crackles Cardiovascular: Regular rate and rhythm. No murmurs, gallops or rubs. Abdomen:Soft. Bowel sounds present. Non-tender.  Extremities: No lower extremity edema. Pulses are non-palpable in the bilateral DP/PT.  Assessment and Plan:   1. PAD: She has severe disease in both legs with bilateral popliteal artery occlusions. Will continue to manage conservatively with Pletal 50 mg po BID. Will repeat ABI in 6 months. I have explained that stenting is not effective behind the knee joint as risk of occlusion is high. If she develops rest pain or ulcerations, will have to consider bypass. Repeat ABI will be arranged today.   2. HTN: BP elevated today but pt reports good control at home.   Follow up one year.

## 2014-10-28 NOTE — Patient Instructions (Signed)
Your physician wants you to follow-up in:  12 months.  You will receive a reminder letter in the mail two months in advance. If you don't receive a letter, please call our office to schedule the follow-up appointment.  Your physician has requested that you have an ankle brachial index (ABI). During this test an ultrasound and blood pressure cuff are used to evaluate the arteries that supply the arms and legs with blood. Allow thirty minutes for this exam. There are no restrictions or special instructions.  

## 2014-11-04 ENCOUNTER — Ambulatory Visit (HOSPITAL_COMMUNITY): Payer: Medicare HMO | Attending: Cardiovascular Disease | Admitting: *Deleted

## 2014-11-04 DIAGNOSIS — E785 Hyperlipidemia, unspecified: Secondary | ICD-10-CM | POA: Diagnosis not present

## 2014-11-04 DIAGNOSIS — I739 Peripheral vascular disease, unspecified: Secondary | ICD-10-CM | POA: Diagnosis not present

## 2014-11-04 DIAGNOSIS — E119 Type 2 diabetes mellitus without complications: Secondary | ICD-10-CM | POA: Diagnosis not present

## 2014-11-04 DIAGNOSIS — I10 Essential (primary) hypertension: Secondary | ICD-10-CM | POA: Insufficient documentation

## 2014-11-04 NOTE — Progress Notes (Signed)
Lower Arterial Doppler Single Level Performed

## 2014-11-07 LAB — MDC_IDC_ENUM_SESS_TYPE_INCLINIC
Battery Impedance: 279 Ohm
Battery Remaining Longevity: 91 mo
Brady Statistic AP VP Percent: 1 %
Brady Statistic AP VS Percent: 0 %
Brady Statistic AS VS Percent: 1 %
Lead Channel Impedance Value: 482 Ohm
Lead Channel Impedance Value: 530 Ohm
Lead Channel Pacing Threshold Amplitude: 0.75 V
Lead Channel Pacing Threshold Amplitude: 0.75 V
Lead Channel Sensing Intrinsic Amplitude: 1 mV
Lead Channel Sensing Intrinsic Amplitude: 22.4 mV
Lead Channel Setting Pacing Amplitude: 2 V
Lead Channel Setting Pacing Pulse Width: 0.46 ms
Lead Channel Setting Sensing Sensitivity: 4 mV
MDC IDC MSMT BATTERY VOLTAGE: 2.78 V
MDC IDC MSMT LEADCHNL RA PACING THRESHOLD PULSEWIDTH: 0.4 ms
MDC IDC MSMT LEADCHNL RV PACING THRESHOLD PULSEWIDTH: 0.4 ms
MDC IDC SESS DTM: 20151116215203
MDC IDC SET LEADCHNL RV PACING AMPLITUDE: 2.5 V
MDC IDC STAT BRADY AS VP PERCENT: 98 %

## 2014-11-08 ENCOUNTER — Encounter: Payer: Self-pay | Admitting: Internal Medicine

## 2014-11-08 ENCOUNTER — Ambulatory Visit (INDEPENDENT_AMBULATORY_CARE_PROVIDER_SITE_OTHER): Payer: Medicare HMO | Admitting: Internal Medicine

## 2014-11-08 VITALS — BP 162/90 | HR 81 | Ht 66.0 in | Wt 153.0 lb

## 2014-11-08 DIAGNOSIS — I5032 Chronic diastolic (congestive) heart failure: Secondary | ICD-10-CM

## 2014-11-08 DIAGNOSIS — I10 Essential (primary) hypertension: Secondary | ICD-10-CM

## 2014-11-08 DIAGNOSIS — Z950 Presence of cardiac pacemaker: Secondary | ICD-10-CM | POA: Insufficient documentation

## 2014-11-08 DIAGNOSIS — I495 Sick sinus syndrome: Secondary | ICD-10-CM

## 2014-11-08 NOTE — Patient Instructions (Signed)
Your physician wants you to follow-up in: 07/2015 with Dr. Lovena Le. You will receive a reminder letter in the mail two months in advance. If you don't receive a letter, please call our office to schedule the follow-up appointment.   Remote monitoring is used to monitor your Pacemaker or ICD from home. This monitoring reduces the number of office visits required to check your device to one time per year. It allows [place] to keep an eye on the functioning of your device to ensure it is working properly. You are scheduled for a device check from home on  02/08/15. You may send your transmission at any time that day. If you have a wireless device, the transmission will be sent automatically. After your physician reviews your transmission, you will receive a postcard with your next transmission date.

## 2014-11-08 NOTE — Assessment & Plan Note (Signed)
Her current symptoms are class I. She will maintain a low-sodium diet and continue her current medications.

## 2014-11-08 NOTE — Assessment & Plan Note (Signed)
Her blood pressure is elevated today. At home, it tends not to be. She will continue her current medical therapy for now. We would consider increasing her antihypertensive medications in the future, if blood pressure remains elevated.

## 2014-11-08 NOTE — Progress Notes (Signed)
HPI Veronica Young returns today for follow-up. She is a 80 year old woman with sinus node dysfunction who underwent permanent pacemaker insertion approximately 3 months ago. In the interim, she has done well. She denies chest pain, shortness of breath, peripheral edema, or syncope. She does not feel palpitations. Allergies  Allergen Reactions  . Amlodipine Other (See Comments) and Nausea Only    Nausea Nausea  . Benicar [Olmesartan Medoxomil] Other (See Comments)    Reaction unknown  . Olmesartan Other (See Comments)    Reaction unknown     Current Outpatient Prescriptions  Medication Sig Dispense Refill  . aspirin EC 81 MG tablet Take 81 mg by mouth every evening.     [name] atorvastatin (LIPITOR) 80 MG tablet Take 80 mg by mouth at bedtime.     [name] atropine 1 % ophthalmic solution Place 1 drop into the left eye 2 (two) times daily.     . calcium-vitamin D (OSCAL WITH D) 500-200 MG-UNIT per tablet Take 1 tablet by mouth daily with breakfast.    . cholecalciferol (VITAMIN D) 1000 UNITS tablet Take 1,000 Units by mouth daily.    . cilostazol (PLETAL) 50 MG tablet Take 50 mg by mouth 2 (two) times daily.    . citalopram (CELEXA) 20 MG tablet Take 20 mg by mouth at bedtime.     . dorzolamide-timolol (COSOPT) 22.3-6.8 MG/ML ophthalmic solution Place 1 drop into both eyes 2 (two) times daily.     . hydrALAZINE (APRESOLINE) 25 MG tablet Take 1 tablet (25 mg total) by mouth 3 (three) times daily. 90 tablet 11  . levothyroxine (SYNTHROID, LEVOTHROID) 112 MCG tablet Take 112 mcg by mouth daily before breakfast.     . lisinopril (PRINIVIL,ZESTRIL) 40 MG tablet Take 1 tablet (40 mg total) by mouth daily. 90 tablet 3  . potassium chloride (K-DUR) 10 MEQ tablet Take 10 mEq by mouth once a week. Takes on Monday     No current facility-administered medications for this visit.     Past Medical History  Diagnosis Date  . Hypertension   . Diabetes mellitus     type 2  . Hypercholesterolemia   .  Deafness   . Carotid bruit   . GERD (gastroesophageal reflux disease)   . Glaucoma   . Hypothyroidism   . PAT (paroxysmal atrial tachycardia)   . PAD (peripheral artery disease)   . Diastolic heart failure     EF is 60%  . Normal cardiac stress test January 2014  . Sick sinus syndrome     ROS:   All systems reviewed and negative except as noted in the HPI.   Past Surgical History  Procedure Laterality Date  . Cholycystectomy    . Total knee arthroplasty      right  . Left breast cyst removed    . Pacemaker insertion  07-26-2014    MDT ADDRL1 pacemaker implanted by Dr Lovena Le for SSS     Family History  Problem Relation Age of Onset  . Heart disease Mother   . Liver cancer Father   . Heart disease Sister   . Heart disease Brother   . Stroke Sister      History   Social History  . Marital Status: Married    Spouse Name: N/A    Number of Children: 4  . Years of Education: N/A   Occupational History  . Not on file.   Social History Main Topics  . Smoking status: Never Smoker   .  Smokeless tobacco: Never Used  . Alcohol Use: No  . Drug Use: No  . Sexual Activity: No   Other Topics Concern  . Not on file   Social History Narrative     BP 162/90 mmHg  Pulse 81  Ht 5\' 6"  (1.676 m)  Wt 153 lb (69.4 kg)  BMI 24.71 kg/m2  SpO2 96%  Physical Exam:  Well appearing 80 year old woman, NAD HEENT: Unremarkable Neck:  No JVD, no thyromegally Back:  No CVA tenderness Lungs:  Clear with no wheezes, rales, or rhonchi. Pacemaker incision is well healed. HEART:  Regular rate rhythm, no murmurs, no rubs, no clicks Abd:  soft, positive bowel sounds, no organomegally, no rebound, no guarding Ext:  2 plus pulses, no edema, no cyanosis, no clubbing Skin:  No rashes no nodules Neuro:  CN II through XII intact, motor grossly intact  DEVICE  Normal device function.  See PaceArt for details.   Assess/Plan:

## 2014-11-08 NOTE — Assessment & Plan Note (Signed)
Her Medtronic dual-chamber pacemaker is working normally. Plan to recheck in approximately 9 months.

## 2014-12-01 ENCOUNTER — Encounter (HOSPITAL_COMMUNITY): Payer: Self-pay | Admitting: Internal Medicine

## 2014-12-22 ENCOUNTER — Other Ambulatory Visit: Payer: Self-pay | Admitting: *Deleted

## 2014-12-22 MED ORDER — LISINOPRIL 40 MG PO TABS: 40.0000 mg | ORAL_TABLET | Freq: Every day | ORAL | Status: DC

## 2014-12-28 ENCOUNTER — Other Ambulatory Visit: Payer: Self-pay | Admitting: *Deleted

## 2014-12-28 MED ORDER — CILOSTAZOL 50 MG PO TABS: 50.0000 mg | ORAL_TABLET | Freq: Two times a day (BID) | ORAL | Status: DC

## 2015-02-08 ENCOUNTER — Encounter: Payer: Self-pay | Admitting: Internal Medicine

## 2015-02-08 ENCOUNTER — Ambulatory Visit (INDEPENDENT_AMBULATORY_CARE_PROVIDER_SITE_OTHER): Payer: Medicare HMO | Admitting: *Deleted

## 2015-02-08 ENCOUNTER — Telehealth: Payer: Self-pay | Admitting: Cardiology

## 2015-02-08 DIAGNOSIS — I495 Sick sinus syndrome: Secondary | ICD-10-CM

## 2015-02-08 NOTE — Telephone Encounter (Signed)
LMOVM reminding pt to send remote transmission.   

## 2015-02-09 NOTE — Progress Notes (Signed)
Remote pacemaker transmission.   

## 2015-02-10 LAB — MDC_IDC_ENUM_SESS_TYPE_REMOTE
Battery Impedance: 100 Ohm
Battery Remaining Longevity: 174 mo
Battery Voltage: 2.79 V
Brady Statistic AP VP Percent: 0 %
Brady Statistic AP VS Percent: 12 %
Brady Statistic AS VP Percent: 0 %
Lead Channel Impedance Value: 565 Ohm
Lead Channel Pacing Threshold Amplitude: 0.625 V
Lead Channel Sensing Intrinsic Amplitude: 1.4 mV
Lead Channel Sensing Intrinsic Amplitude: 16 mV
Lead Channel Setting Pacing Amplitude: 1.5 V
Lead Channel Setting Pacing Pulse Width: 0.46 ms
Lead Channel Setting Sensing Sensitivity: 5.6 mV
MDC IDC MSMT LEADCHNL RA IMPEDANCE VALUE: 503 Ohm
MDC IDC MSMT LEADCHNL RA PACING THRESHOLD PULSEWIDTH: 0.4 ms
MDC IDC MSMT LEADCHNL RV PACING THRESHOLD AMPLITUDE: 0.5 V
MDC IDC MSMT LEADCHNL RV PACING THRESHOLD PULSEWIDTH: 0.4 ms
MDC IDC SESS DTM: 20160218021205
MDC IDC SET LEADCHNL RV PACING AMPLITUDE: 2 V
MDC IDC STAT BRADY AS VS PERCENT: 88 %

## 2015-02-17 ENCOUNTER — Encounter: Payer: Self-pay | Admitting: Cardiology

## 2015-03-08 ENCOUNTER — Telehealth: Payer: Self-pay | Admitting: Cardiovascular Disease

## 2015-03-08 NOTE — Telephone Encounter (Signed)
Sharp pain in both legs, worse in left leg, and pain is only when patient is walking. Patient denies any swelling, redness, or radiating heat from legs. Referred patient to primary care doctor to follow up with leg pain, possibly need a referral for vascular doctor. Patient verbalized understanding and agreed to plan.

## 2015-03-08 NOTE — Telephone Encounter (Signed)
New message    patient calling C/O leg pain for several days. Would like to be seen today.   No chest pain . No sob.

## 2015-03-31 ENCOUNTER — Ambulatory Visit: Payer: Medicare HMO | Admitting: Cardiology

## 2015-05-10 ENCOUNTER — Ambulatory Visit (INDEPENDENT_AMBULATORY_CARE_PROVIDER_SITE_OTHER): Payer: Medicare HMO | Admitting: *Deleted

## 2015-05-10 ENCOUNTER — Encounter: Payer: Self-pay | Admitting: Internal Medicine

## 2015-05-10 ENCOUNTER — Telehealth: Payer: Self-pay | Admitting: Cardiology

## 2015-05-10 DIAGNOSIS — I495 Sick sinus syndrome: Secondary | ICD-10-CM | POA: Diagnosis not present

## 2015-05-10 NOTE — Telephone Encounter (Signed)
LMOVM reminding pt to send remote transmission.   

## 2015-05-11 NOTE — Progress Notes (Signed)
Remote pacemaker transmission.   

## 2015-05-13 LAB — CUP PACEART REMOTE DEVICE CHECK
Battery Impedance: 100 Ohm
Battery Remaining Longevity: 173 mo
Brady Statistic AP VP Percent: 0 %
Brady Statistic AS VP Percent: 0 %
Brady Statistic AS VS Percent: 85 %
Date Time Interrogation Session: 20160518210205
Lead Channel Impedance Value: 518 Ohm
Lead Channel Pacing Threshold Amplitude: 0.625 V
Lead Channel Pacing Threshold Amplitude: 0.625 V
Lead Channel Pacing Threshold Pulse Width: 0.4 ms
Lead Channel Sensing Intrinsic Amplitude: 1 mV
Lead Channel Sensing Intrinsic Amplitude: 16 mV
Lead Channel Setting Pacing Amplitude: 2 V
Lead Channel Setting Sensing Sensitivity: 5.6 mV
MDC IDC MSMT BATTERY VOLTAGE: 2.79 V
MDC IDC MSMT LEADCHNL RV IMPEDANCE VALUE: 592 Ohm
MDC IDC MSMT LEADCHNL RV PACING THRESHOLD PULSEWIDTH: 0.4 ms
MDC IDC SET LEADCHNL RA PACING AMPLITUDE: 1.5 V
MDC IDC SET LEADCHNL RV PACING PULSEWIDTH: 0.4 ms
MDC IDC STAT BRADY AP VS PERCENT: 15 %

## 2015-05-17 ENCOUNTER — Other Ambulatory Visit: Payer: Self-pay | Admitting: Cardiovascular Disease

## 2015-05-17 ENCOUNTER — Emergency Department (HOSPITAL_COMMUNITY)
Admission: EM | Admit: 2015-05-17 | Discharge: 2015-05-17 | Disposition: A | Discharge status: Home / Self Care | Payer: Medicare HMO | Attending: Emergency Medicine | Admitting: Emergency Medicine

## 2015-05-17 ENCOUNTER — Emergency Department (HOSPITAL_COMMUNITY): Payer: Medicare HMO

## 2015-05-17 ENCOUNTER — Encounter (HOSPITAL_COMMUNITY): Payer: Self-pay

## 2015-05-17 DIAGNOSIS — Y9289 Other specified places as the place of occurrence of the external cause: Secondary | ICD-10-CM | POA: Insufficient documentation

## 2015-05-17 DIAGNOSIS — I10 Essential (primary) hypertension: Secondary | ICD-10-CM | POA: Diagnosis not present

## 2015-05-17 DIAGNOSIS — Z8719 Personal history of other diseases of the digestive system: Secondary | ICD-10-CM | POA: Insufficient documentation

## 2015-05-17 DIAGNOSIS — Z7982 Long term (current) use of aspirin: Secondary | ICD-10-CM | POA: Insufficient documentation

## 2015-05-17 DIAGNOSIS — E119 Type 2 diabetes mellitus without complications: Secondary | ICD-10-CM | POA: Insufficient documentation

## 2015-05-17 DIAGNOSIS — H9190 Unspecified hearing loss, unspecified ear: Secondary | ICD-10-CM | POA: Insufficient documentation

## 2015-05-17 DIAGNOSIS — Z79899 Other long term (current) drug therapy: Secondary | ICD-10-CM | POA: Insufficient documentation

## 2015-05-17 DIAGNOSIS — I5030 Unspecified diastolic (congestive) heart failure: Secondary | ICD-10-CM | POA: Diagnosis not present

## 2015-05-17 DIAGNOSIS — Y998 Other external cause status: Secondary | ICD-10-CM | POA: Diagnosis not present

## 2015-05-17 DIAGNOSIS — E785 Hyperlipidemia, unspecified: Secondary | ICD-10-CM | POA: Diagnosis not present

## 2015-05-17 DIAGNOSIS — S6992XA Unspecified injury of left wrist, hand and finger(s), initial encounter: Secondary | ICD-10-CM | POA: Diagnosis not present

## 2015-05-17 DIAGNOSIS — I739 Peripheral vascular disease, unspecified: Secondary | ICD-10-CM

## 2015-05-17 DIAGNOSIS — W228XXA Striking against or struck by other objects, initial encounter: Secondary | ICD-10-CM | POA: Insufficient documentation

## 2015-05-17 DIAGNOSIS — E039 Hypothyroidism, unspecified: Secondary | ICD-10-CM | POA: Insufficient documentation

## 2015-05-17 DIAGNOSIS — Z950 Presence of cardiac pacemaker: Secondary | ICD-10-CM | POA: Diagnosis not present

## 2015-05-17 DIAGNOSIS — M79642 Pain in left hand: Secondary | ICD-10-CM

## 2015-05-17 DIAGNOSIS — H409 Unspecified glaucoma: Secondary | ICD-10-CM | POA: Insufficient documentation

## 2015-05-17 DIAGNOSIS — Y9389 Activity, other specified: Secondary | ICD-10-CM | POA: Insufficient documentation

## 2015-05-17 NOTE — ED Notes (Signed)
Pt hit her left wrist and hand yesterday evening, tonight it's swollen and sore and she can't move it

## 2015-05-17 NOTE — Discharge Instructions (Signed)
Please follow directions provided. Be sure to follow-up with your primary care doctor to make sure you're getting better. The pain does not improve she can follow-up with the hand doctor listed.  Please take tylenol every 4 hours for pain.  Don't hesitate to return for any new, worsening or concerning symptoms.     SEEK IMMEDIATE MEDICAL CARE IF:  You have increased redness, swelling, or pain in your hand.  Your swelling or pain is not relieved with medicines.  You have loss of feeling in your hand or are unable to move your fingers.  Your hand turns cold or blue.  You have pain when you move your fingers.  Your hand becomes warm to the touch.  Your contusion does not improve in 2 days.

## 2015-05-17 NOTE — ED Notes (Signed)
Patient transported to X-ray 

## 2015-05-17 NOTE — ED Provider Notes (Signed)
CSN: [id number]     Arrival date & time 05/17/15  0155 History   First MD Initiated Contact with Patient 05/17/15 [phone]     Chief Complaint  Patient presents with  . Wrist Pain   (Consider location/radiation/quality/duration/timing/severity/associated sxs/prior Treatment) HPI  Veronica Young is a 81 year old female presenting with left thumb and wrist pain. Her daughter states she hit her hand yesterday causing a small bruise on the dorsal aspect of her left hand. Later that evening she began to have significant pain when she moved her left thumb. She currently is pain-free but on arrival to the emergency department she that her pain as an 8 out of 10. She denies any alteration in sensation or color change in her fingertips.  Past Medical History  Diagnosis Date  . Hypertension   . Diabetes mellitus     type 2  . Hypercholesterolemia   . Deafness   . Carotid bruit   . GERD (gastroesophageal reflux disease)   . Glaucoma   . Hypothyroidism   . PAT (paroxysmal atrial tachycardia)   . PAD (peripheral artery disease)   . Diastolic heart failure     EF is 60%  . Normal cardiac stress test January 2014  . Sick sinus syndrome    Past Surgical History  Procedure Laterality Date  . Cholycystectomy    . Total knee arthroplasty      right  . Left breast cyst removed    . Pacemaker insertion  07-26-2014    MDT ADDRL1 pacemaker implanted by Dr Lovena Le for SSS  . Permanent pacemaker insertion N/A 07/26/2014    Procedure: PERMANENT PACEMAKER INSERTION;  Surgeon: Evans Lance, MD;  Location: [facility] CATH LAB;  Service: Cardiovascular;  Laterality: N/A;   Family History  Problem Relation Age of Onset  . Heart disease Mother   . Liver cancer Father   . Heart disease Sister   . Heart disease Brother   . Stroke Sister    History  Substance Use Topics  . Smoking status: Never Smoker   . Smokeless tobacco: Never Used  . Alcohol Use: No   OB History    No data available     Review of  Systems  Musculoskeletal: Positive for myalgias and arthralgias.  Skin: Negative for wound.  Neurological: Negative for weakness and numbness.      Allergies  Amlodipine and Benicar  Home Medications   Prior to Admission medications   Medication Sig Start Date End Date Taking? Authorizing Provider  aspirin EC 81 MG tablet Take 81 mg by mouth every evening.    Yes Historical Provider, MD  atorvastatin (LIPITOR) 80 MG tablet Take 80 mg by mouth at bedtime.    Yes Historical Provider, MD  atropine 1 % ophthalmic solution Place 1 drop into the left eye 2 (two) times daily.    Yes Historical Provider, MD  calcium-vitamin D (OSCAL WITH D) 500-200 MG-UNIT per tablet Take 1 tablet by mouth daily with breakfast.   Yes Historical Provider, MD  cholecalciferol (VITAMIN D) 1000 UNITS tablet Take 1,000 Units by mouth daily.   Yes Historical Provider, MD  cilostazol (PLETAL) 50 MG tablet Take 1 tablet (50 mg total) by mouth 2 (two) times daily. 12/28/14  Yes Peter M [name], MD  citalopram (CELEXA) 20 MG tablet Take 20 mg by mouth at bedtime.  02/08/11  Yes Historical Provider, MD  dorzolamide-timolol (COSOPT) 22.3-6.8 MG/ML ophthalmic solution Place 1 drop into both eyes 2 (two) times daily.  Yes Historical Provider, MD  hydrALAZINE (APRESOLINE) 25 MG tablet Take 1 tablet (25 mg total) by mouth 3 (three) times daily. 07/27/14  Yes Rhonda G Barrett, PA-C  levothyroxine (SYNTHROID, LEVOTHROID) 112 MCG tablet Take 112 mcg by mouth daily before breakfast.  03/31/14  Yes Historical Provider, MD  lisinopril (PRINIVIL,ZESTRIL) 40 MG tablet Take 1 tablet (40 mg total) by mouth daily. 12/22/14  Yes Evans Lance, MD  OVER THE COUNTER MEDICATION Take 1 tablet by mouth daily. Herbal supplement to help circulation in legs   Yes Historical Provider, MD  potassium chloride (K-DUR) 10 MEQ tablet Take 10 mEq by mouth once a week. Takes on Monday   Yes Historical Provider, MD   BP 220/86 mmHg  Pulse 67  Temp(Src) 98.7  F (37.1 C) (Oral)  Resp 18  SpO2 94% Physical Exam  Constitutional: She appears well-developed and well-nourished. No distress.  HENT:  Head: Normocephalic and atraumatic.  Mouth/Throat: Oropharynx is clear and moist.  Eyes: Conjunctivae are normal.  Neck: Neck supple.  Cardiovascular: Normal rate, regular rhythm and intact distal pulses.   Pulmonary/Chest: Effort normal and breath sounds normal. No respiratory distress. She has no wheezes. She has no rales. She exhibits no tenderness.  Abdominal: Soft. There is no tenderness.  Musculoskeletal: She exhibits tenderness.       Left hand: She exhibits normal range of motion. Normal sensation noted. Normal strength noted.       Hands: Left, first metacarpal TTP, No scaphoid tenderness, no deformity or redness or swelling  Lymphadenopathy:    She has no cervical adenopathy.  Neurological: She is alert.  Skin: Skin is warm and dry. No rash noted. She is not diaphoretic.  Psychiatric: She has a normal mood and affect.  Nursing note and vitals reviewed.   ED Course  Procedures (including critical care time) Labs Review Labs Reviewed - No data to display  Imaging Review Dg Wrist Complete Left  05/17/2015   CLINICAL DATA:  Blunt injury May 24th, persistent pain.  EXAM: LEFT WRIST - COMPLETE 3+ VIEW  COMPARISON:  None.  FINDINGS: There is no evidence of fracture or dislocation. There is no evidence of arthropathy or other focal bone abnormality. Soft tissues are unremarkable.  IMPRESSION: Negative.   Electronically Signed   By: Elon Alas M.D.   On: 05/17/2015 03:00     EKG Interpretation None      MDM   Final diagnoses:  Hand pain, left   81 yo with left hand pain, x-ray negative for obvious fracture or dislocation. Pain managed in ED. Pt advised to follow up with hand surgery if symptoms persist. Patient given brace while in ED, conservative therapy recommended and discussed. Patient will be dc home & is agreeable with  above plan. Pt is well-appearing, in no acute distress and vital signs are stable.  They appear safe to be discharged.  Discharge include follow-up with their PCP.  Return precautions provided.    Filed Vitals:   05/17/15 0205 05/17/15 0458 05/17/15 0538  BP: 220/86 175/63 170/72  Pulse: 67 76 74  Temp: 98.7 F (37.1 C)  98.6 F (37 C)  TempSrc: Oral  Oral  Resp: 18 15   SpO2: 94% 97% 96%   Meds given in ED:  Medications - No data to display  Discharge Medication List as of 05/17/2015  5:17 AM         Britt Bottom, NP 05/19/15 0201  Everlene Balls, MD 05/19/15 1747

## 2015-05-19 ENCOUNTER — Other Ambulatory Visit (HOSPITAL_COMMUNITY): Payer: Medicare HMO

## 2015-05-19 ENCOUNTER — Encounter (HOSPITAL_COMMUNITY): Payer: Medicare HMO

## 2015-05-19 ENCOUNTER — Ambulatory Visit (HOSPITAL_COMMUNITY): Payer: Medicare HMO | Attending: Cardiovascular Disease

## 2015-05-19 DIAGNOSIS — I739 Peripheral vascular disease, unspecified: Secondary | ICD-10-CM | POA: Diagnosis present

## 2015-05-24 ENCOUNTER — Encounter: Payer: Self-pay | Admitting: Cardiology

## 2015-05-31 ENCOUNTER — Ambulatory Visit: Payer: Medicare HMO | Admitting: Cardiology

## 2015-06-13 ENCOUNTER — Encounter: Payer: Self-pay | Admitting: Cardiology

## 2015-08-09 ENCOUNTER — Other Ambulatory Visit: Payer: Self-pay

## 2015-08-15 ENCOUNTER — Encounter: Payer: Self-pay | Admitting: Internal Medicine

## 2015-08-15 ENCOUNTER — Ambulatory Visit (INDEPENDENT_AMBULATORY_CARE_PROVIDER_SITE_OTHER): Payer: Medicare HMO | Admitting: Internal Medicine

## 2015-08-15 ENCOUNTER — Other Ambulatory Visit: Payer: Self-pay

## 2015-08-15 VITALS — HR 84 | Ht 66.0 in | Wt 152.4 lb

## 2015-08-15 DIAGNOSIS — I5032 Chronic diastolic (congestive) heart failure: Secondary | ICD-10-CM

## 2015-08-15 DIAGNOSIS — I10 Essential (primary) hypertension: Secondary | ICD-10-CM | POA: Diagnosis not present

## 2015-08-15 DIAGNOSIS — Z950 Presence of cardiac pacemaker: Secondary | ICD-10-CM | POA: Diagnosis not present

## 2015-08-15 DIAGNOSIS — I495 Sick sinus syndrome: Secondary | ICD-10-CM

## 2015-08-15 LAB — CUP PACEART INCLINIC DEVICE CHECK
Battery Impedance: 100 Ohm
Battery Voltage: 2.79 V
Brady Statistic AP VP Percent: 0 %
Brady Statistic AS VP Percent: 0 %
Brady Statistic AS VS Percent: 84 %
Date Time Interrogation Session: 20160823100017
Lead Channel Impedance Value: 550 Ohm
Lead Channel Pacing Threshold Amplitude: 1.25 V
Lead Channel Pacing Threshold Pulse Width: 0.4 ms
Lead Channel Sensing Intrinsic Amplitude: 1.4 mV
Lead Channel Sensing Intrinsic Amplitude: 22.4 mV
Lead Channel Setting Pacing Amplitude: 1.5 V
Lead Channel Setting Pacing Pulse Width: 0.4 ms
MDC IDC MSMT BATTERY REMAINING LONGEVITY: 173 mo
MDC IDC MSMT LEADCHNL RA IMPEDANCE VALUE: 518 Ohm
MDC IDC MSMT LEADCHNL RV PACING THRESHOLD AMPLITUDE: 0.75 V
MDC IDC MSMT LEADCHNL RV PACING THRESHOLD PULSEWIDTH: 0.4 ms
MDC IDC SET LEADCHNL RV PACING AMPLITUDE: 2 V
MDC IDC SET LEADCHNL RV SENSING SENSITIVITY: 5.6 mV
MDC IDC STAT BRADY AP VS PERCENT: 16 %

## 2015-08-15 NOTE — Patient Instructions (Signed)
Medication Instructions:  Your physician recommends that you continue on your current medications as directed. Please refer to the Current Medication list given to you today.   Labwork: None ordered  Testing/Procedures: None ordered  Follow-Up: Your physician wants you to follow-up in: 12 months with Dr Knox Saliva will receive a reminder letter in the mail two months in advance. If you don't receive a letter, please call our office to schedule the follow-up appointment.   Remote monitoring is used to monitor your Pacemaker  from home. This monitoring reduces the number of office visits required to check your device to one time per year. It allows [place] to keep an eye on the functioning of your device to ensure it is working properly. You are scheduled for a device check from home on 11/14/15. You may send your transmission at any time that day. If you have a wireless device, the transmission will be sent automatically. After your physician reviews your transmission, you will receive a postcard with your next transmission date.    Any Other Special Instructions Will Be Listed Below (If Applicable).

## 2015-08-16 NOTE — Assessment & Plan Note (Signed)
Her Medtr[employer] DDD PM is working normally. Will recheck in several months. 

## 2015-08-16 NOTE — Assessment & Plan Note (Signed)
Her symptoms are class 2. She will continue her current meds and maintain a low sodium diet. 

## 2015-08-16 NOTE — Assessment & Plan Note (Signed)
Her blood pressure by me was 128/68. She will continue her current meds.

## 2015-08-16 NOTE — Progress Notes (Signed)
HPI Veronica Young returns today for follow-up. She is a 81 year old woman with sinus node dysfunction who underwent permanent pacemaker insertion several months ago. In the interim, she has done well. She denies chest pain, shortness of breath, peripheral edema, or syncope. She does not feel palpitations. Allergies  Allergen Reactions  . Amlodipine Nausea Only       . Benicar [Olmesartan Medoxomil] Other (See Comments)    Reaction unknown     Current Outpatient Prescriptions  Medication Sig Dispense Refill  . aspirin EC 81 MG tablet Take 81 mg by mouth every evening.     [name] atorvastatin (LIPITOR) 80 MG tablet Take 80 mg by mouth at bedtime.     [name] atropine 1 % ophthalmic solution Place 1 drop into the left eye 2 (two) times daily.     . calcium-vitamin D (OSCAL WITH D) 500-200 MG-UNIT per tablet Take 1 tablet by mouth daily with breakfast.    . cholecalciferol (VITAMIN D) 1000 UNITS tablet Take 1,000 Units by mouth daily.    . cilostazol (PLETAL) 50 MG tablet Take 1 tablet (50 mg total) by mouth 2 (two) times daily. 180 tablet 0  . citalopram (CELEXA) 20 MG tablet Take 20 mg by mouth at bedtime.     . dorzolamide-timolol (COSOPT) 22.3-6.8 MG/ML ophthalmic solution Place 1 drop into both eyes 2 (two) times daily.     . hydrALAZINE (APRESOLINE) 25 MG tablet Take 1 tablet (25 mg total) by mouth 3 (three) times daily. 90 tablet 11  . levothyroxine (SYNTHROID, LEVOTHROID) 112 MCG tablet Take 112 mcg by mouth daily before breakfast.     . lisinopril (PRINIVIL,ZESTRIL) 40 MG tablet Take 1 tablet (40 mg total) by mouth daily. 90 tablet 3  . OVER THE COUNTER MEDICATION Take 1 tablet by mouth daily. Herbal supplement to help circulation in legs    . potassium chloride (K-DUR) 10 MEQ tablet Take 10 mEq by mouth once a week. Takes on Monday     No current facility-administered medications for this visit.     Past Medical History  Diagnosis Date  . Hypertension   . Diabetes mellitus    type 2  . Hypercholesterolemia   . Deafness   . Carotid bruit   . GERD (gastroesophageal reflux disease)   . Glaucoma   . Hypothyroidism   . PAT (paroxysmal atrial tachycardia)   . PAD (peripheral artery disease)   . Diastolic heart failure     EF is 60%  . Normal cardiac stress test January 2014  . Sick sinus syndrome     ROS:   All systems reviewed and negative except as noted in the HPI.   Past Surgical History  Procedure Laterality Date  . Cholycystectomy    . Total knee arthroplasty      right  . Left breast cyst removed    . Pacemaker insertion  07-26-2014    MDT ADDRL1 pacemaker implanted by Dr Lovena Le for SSS  . Permanent pacemaker insertion N/A 07/26/2014    Procedure: PERMANENT PACEMAKER INSERTION;  Surgeon: Evans Lance, MD;  Location: [facility] CATH LAB;  Service: Cardiovascular;  Laterality: N/A;     Family History  Problem Relation Age of Onset  . Heart disease Mother   . Liver cancer Father   . Heart disease Sister   . Heart disease Brother   . Stroke Sister      Social History   Social History  . Marital Status: Widowed  Spouse Name: N/A  . Number of Children: 4  . Years of Education: N/A   Occupational History  . Not on file.   Social History Main Topics  . Smoking status: Never Smoker   . Smokeless tobacco: Never Used  . Alcohol Use: No  . Drug Use: No  . Sexual Activity: No   Other Topics Concern  . Not on file   Social History Narrative     Pulse 84  Ht 5\' 6"  (1.676 m)  Wt 152 lb 6.4 oz (69.128 kg)  BMI 24.61 kg/m2  Physical Exam:  Well appearing 81 year old woman, NAD HEENT: Unremarkable Neck:  No JVD, no thyromegally Back:  No CVA tenderness Lungs:  Clear with no wheezes, rales, or rhonchi. Pacemaker incision is well healed. HEART:  Regular rate rhythm, no murmurs, no rubs, no clicks Abd:  soft, positive bowel sounds, no organomegally, no rebound, no guarding Ext:  2 plus pulses, no edema, no cyanosis, no clubbing Skin:   No rashes no nodules Neuro:  CN II through XII intact, motor grossly intact  DEVICE  Normal device function.  See PaceArt for details.   Assess/Plan:

## 2015-08-29 ENCOUNTER — Other Ambulatory Visit: Payer: Self-pay | Admitting: Cardiology

## 2015-11-01 ENCOUNTER — Other Ambulatory Visit: Payer: Self-pay | Admitting: Cardiology

## 2015-11-14 ENCOUNTER — Telehealth: Payer: Self-pay | Admitting: Cardiology

## 2015-11-14 ENCOUNTER — Encounter: Payer: Medicare HMO | Admitting: *Deleted

## 2015-11-14 NOTE — Telephone Encounter (Signed)
LMOVM reminding pt to send remote transmission.   

## 2015-11-15 ENCOUNTER — Encounter: Payer: Self-pay | Admitting: Cardiology

## 2015-11-24 ENCOUNTER — Ambulatory Visit (INDEPENDENT_AMBULATORY_CARE_PROVIDER_SITE_OTHER): Payer: Medicare HMO | Admitting: *Deleted

## 2015-11-24 DIAGNOSIS — I495 Sick sinus syndrome: Secondary | ICD-10-CM | POA: Diagnosis not present

## 2015-11-28 NOTE — Progress Notes (Signed)
Remote pacemaker transmission.   

## 2015-12-04 ENCOUNTER — Encounter: Payer: Self-pay | Admitting: Cardiovascular Disease

## 2015-12-04 ENCOUNTER — Ambulatory Visit (INDEPENDENT_AMBULATORY_CARE_PROVIDER_SITE_OTHER): Payer: Medicare HMO | Admitting: Cardiovascular Disease

## 2015-12-04 VITALS — BP 180/80 | HR 83 | Ht 66.0 in | Wt 155.8 lb

## 2015-12-04 DIAGNOSIS — I10 Essential (primary) hypertension: Secondary | ICD-10-CM

## 2015-12-04 DIAGNOSIS — I739 Peripheral vascular disease, unspecified: Secondary | ICD-10-CM

## 2015-12-04 NOTE — Progress Notes (Signed)
Chief Complaint  Patient presents with  . Follow-up    PAD/HTN/ PT C/O PAIN IN LEGS     History of Present Illness: 81 yo WF with history of HTN, DM, HLD, deafness, GERD, hypothyroidism who is here today for PV follow up. She is followed in our cardiology office by Dr. Peter [name]. In regards to her PAD, she had a CT angiogram of the distal aorta and bilateral lower extremities which showed bilateral popliteal artery occlusions. She does not have flow limiting disease in the iliacs, common femorals or proximal or mid segments of bilateral SFA. We have elected for conservative management. Carotid artery dopplers in 2010 with mild bilateral carotid artery disease. Last lower ext studies May 2016 with ABI 0.44 on the right and 0.45 on the left.   She is here for f/u and feels great. She has no leg pain with walking. No rest pain during day or ulcerations. Some pain across shins at night while in bed. This has not changed over the last few years.   Primary Care Physician: Joneen Caraway  Past Medical History  Diagnosis Date  . Hypertension   . Diabetes mellitus     type 2  . Hypercholesterolemia   . Deafness   . Carotid bruit   . GERD (gastroesophageal reflux disease)   . Glaucoma   . Hypothyroidism   . PAT (paroxysmal atrial tachycardia) ([place])   . PAD (peripheral artery disease) ([place])   . Diastolic heart failure (HCC)     EF is 60%  . Normal cardiac stress test January 2014  . Sick sinus syndrome [facility])     Past Surgical History  Procedure Laterality Date  . Cholycystectomy    . Total knee arthroplasty      right  . Left breast cyst removed    . Pacemaker insertion  07-26-2014    MDT ADDRL1 pacemaker implanted by Dr Lovena Le for SSS  . Permanent pacemaker insertion N/A 07/26/2014    Procedure: PERMANENT PACEMAKER INSERTION;  Surgeon: Evans Lance, MD;  Location: [facility] CATH LAB;  Service: Cardiovascular;  Laterality: N/A;    Current Outpatient Prescriptions  Medication Sig  Dispense Refill  . ALPHAGAN P 0.1 % SOLN Place 1 drop into both eyes daily.  12  . amoxicillin (AMOXIL) 500 MG capsule Take 4 capsules by mouth as needed. FOR DENTAL APPOINTMENTS  0  . aspirin EC 81 MG tablet Take 81 mg by mouth every evening.     [name] atorvastatin (LIPITOR) 80 MG tablet Take 80 mg by mouth at bedtime.     [name] atropine 1 % ophthalmic ointment Place 1 application into the left eye daily.  1  . atropine 1 % ophthalmic solution Place 1 drop into the left eye 2 (two) times daily.     . calcium-vitamin D (OSCAL WITH D) 500-200 MG-UNIT per tablet Take 1 tablet by mouth daily with breakfast.    . cholecalciferol (VITAMIN D) 1000 UNITS tablet Take 1,000 Units by mouth daily.    . cilostazol (PLETAL) 50 MG tablet Take 1 tablet (50 mg total) by mouth 2 (two) times daily. 180 tablet 0  . citalopram (CELEXA) 20 MG tablet Take 20 mg by mouth at bedtime.     . dorzolamide-timolol (COSOPT) 22.3-6.8 MG/ML ophthalmic solution Place 1 drop into both eyes 2 (two) times daily.     . hydrALAZINE (APRESOLINE) 25 MG tablet Take 1 tablet (25 mg total) by mouth 3 (three) times daily. 90 tablet 11  .  latanoprost (XALATAN) 0.005 % ophthalmic solution Place 1 drop into both eyes daily.  12  . levothyroxine (SYNTHROID, LEVOTHROID) 112 MCG tablet Take 112 mcg by mouth daily before breakfast.     . lisinopril (PRINIVIL,ZESTRIL) 40 MG tablet Take 1 tablet (40 mg total) by mouth daily. 90 tablet 3  . OVER THE COUNTER MEDICATION Take 1 tablet by mouth daily. Herbal supplement to help circulation in legs    . potassium chloride (K-DUR) 10 MEQ tablet Take 10 mEq by mouth once a week. Takes on Monday    . potassium chloride (K-DUR,KLOR-CON) 10 MEQ tablet Take 10 mEq by mouth daily.     No current facility-administered medications for this visit.    Allergies  Allergen Reactions  . Amlodipine Nausea Only       . Benicar [Olmesartan Medoxomil] Other (See Comments)    Reaction unknown    Social History    Social History  . Marital Status: Widowed    Spouse Name: N/A  . Number of Children: 4  . Years of Education: N/A   Occupational History  . Not on file.   Social History Main Topics  . Smoking status: Never Smoker   . Smokeless tobacco: Never Used  . Alcohol Use: No  . Drug Use: No  . Sexual Activity: No   Other Topics Concern  . Not on file   Social History Narrative    Family History  Problem Relation Age of Onset  . Heart disease Mother   . Liver cancer Father   . Heart disease Sister   . Heart disease Brother   . Stroke Sister     Review of Systems:  As stated in the HPI and otherwise negative.   BP 180/80 mmHg  Pulse 83  Ht 5\' 6"  (1.676 m)  Wt 155 lb 12.8 oz (70.67 kg)  BMI 25.16 kg/m2  SpO2 95%  Physical Examination: General: Well developed, well nourished, NAD HEENT: OP clear, mucus membranes moist SKIN: warm, dry. No rashes. Neuro: No focal deficits Musculoskeletal: Muscle strength 5/5 all ext Psychiatric: Mood and affect normal Neck: No JVD, no carotid bruits, no thyromegaly, no lymphadenopathy. Lungs:Clear bilaterally, no wheezes, rhonci, crackles Cardiovascular: Regular rate and rhythm. No murmurs, gallops or rubs. Abdomen:Soft. Bowel sounds present. Non-tender.  Extremities: No lower extremity edema. Pulses are non-palpable in the bilateral DP/PT.  EKG:  EKG is ordered today. The ekg ordered today demonstrates NSR, rate 83 bpm. Non-specific ST abnormality.   Recent Labs: No results found for requested labs within last 365 days.   Lipid Panel No results found for: CHOL, TRIG, HDL, CHOLHDL, VLDL, LDLCALC, LDLDIRECT   Wt Readings from Last 3 Encounters:  12/04/15 155 lb 12.8 oz (70.67 kg)  08/15/15 152 lb 6.4 oz (69.128 kg)  11/08/14 153 lb (69.4 kg)     Other studies Reviewed: Additional studies/ records that were reviewed today include: . Review of the above records demonstrates:    Assessment and Plan:   1. PAD: She has severe  disease in both legs with bilateral popliteal artery occlusions. Fortunately, she has very little pain in her legs, mostly at night. No claudication. ABI stable May 2016. Will continue to manage conservatively with Pletal 50 mg po BID. Will repeat ABI in May 2017.   2. HTN: BP elevated today but pt reports good control at home. I will not adjust her BP meds today.   Current medicines are reviewed at length with the patient today.  The patient does  not have concerns regarding medicines.  The following changes have been made:  no change  Labs/ tests ordered today include:   Orders Placed This Encounter  Procedures  . EKG 12-Lead     Disposition:   FU with me in 12  months   Signed, Lauree Chandler, MD 12/04/2015 11:10 AM    [facility] [address], [place], [place]  [address] Phone: [phone]; Fax: [phone]

## 2015-12-04 NOTE — Patient Instructions (Signed)
Medication Instructions:  Your physician recommends that you continue on your current medications as directed. Please refer to the Current Medication list given to you today.   Labwork: none  Testing/Procedures: Your physician has requested that you have an ankle brachial index (ABI). During this test an ultrasound and blood pressure cuff are used to evaluate the arteries that supply the arms and legs with blood. Allow thirty minutes for this exam. There are no restrictions or special instructions.  To be done in May/June 2017--after May 27,2017    Follow-Up: Your physician wants you to follow-up in: 12 months.  You will receive a reminder letter in the mail two months in advance. If you don't receive a letter, please call our office to schedule the follow-up appointment.   Any Other Special Instructions Will Be Listed Below (If Applicable).     If you need a refill on your cardiac medications before your next appointment, please call your pharmacy.

## 2015-12-06 LAB — CUP PACEART REMOTE DEVICE CHECK
Battery Impedance: 100 Ohm
Brady Statistic AS VP Percent: 0 %
Date Time Interrogation Session: 20161203022238
Implantable Lead Implant Date: 20150805
Implantable Lead Location: 753859
Implantable Lead Location: 753860
Implantable Lead Model: 5076
Implantable Lead Model: 5076
Lead Channel Impedance Value: 606 Ohm
Lead Channel Pacing Threshold Pulse Width: 0.4 ms
Lead Channel Sensing Intrinsic Amplitude: 0.7 mV
Lead Channel Sensing Intrinsic Amplitude: 16 mV
Lead Channel Setting Pacing Amplitude: 1.5 V
Lead Channel Setting Pacing Amplitude: 2 V
Lead Channel Setting Pacing Pulse Width: 0.4 ms
MDC IDC LEAD IMPLANT DT: 20150805
MDC IDC MSMT BATTERY REMAINING LONGEVITY: 170 mo
MDC IDC MSMT BATTERY VOLTAGE: 2.79 V
MDC IDC MSMT LEADCHNL RA IMPEDANCE VALUE: 496 Ohm
MDC IDC MSMT LEADCHNL RA PACING THRESHOLD AMPLITUDE: 0.625 V
MDC IDC MSMT LEADCHNL RA PACING THRESHOLD PULSEWIDTH: 0.4 ms
MDC IDC MSMT LEADCHNL RV PACING THRESHOLD AMPLITUDE: 0.625 V
MDC IDC SET LEADCHNL RV SENSING SENSITIVITY: 5.6 mV
MDC IDC STAT BRADY AP VP PERCENT: 0 %
MDC IDC STAT BRADY AP VS PERCENT: 29 %
MDC IDC STAT BRADY AS VS PERCENT: 71 %

## 2015-12-08 ENCOUNTER — Encounter: Payer: Self-pay | Admitting: Cardiology

## 2016-01-02 ENCOUNTER — Other Ambulatory Visit: Payer: Self-pay | Admitting: Cardiovascular Disease

## 2016-01-08 ENCOUNTER — Other Ambulatory Visit: Payer: Self-pay | Admitting: Internal Medicine

## 2016-02-26 ENCOUNTER — Ambulatory Visit: Payer: Medicare HMO | Admitting: *Deleted

## 2016-03-01 ENCOUNTER — Encounter: Payer: Self-pay | Admitting: Cardiology

## 2016-03-07 ENCOUNTER — Ambulatory Visit (INDEPENDENT_AMBULATORY_CARE_PROVIDER_SITE_OTHER): Payer: Medicare HMO | Admitting: *Deleted

## 2016-03-07 DIAGNOSIS — I495 Sick sinus syndrome: Secondary | ICD-10-CM | POA: Diagnosis not present

## 2016-03-15 NOTE — Progress Notes (Signed)
Remote pacemaker transmission.   

## 2016-03-26 ENCOUNTER — Encounter: Payer: Self-pay | Admitting: Cardiology

## 2016-03-26 LAB — CUP PACEART REMOTE DEVICE CHECK
Battery Impedance: 132 Ohm
Brady Statistic AS VS Percent: 65 %
Date Time Interrogation Session: 20170316234211
Implantable Lead Implant Date: 20150805
Implantable Lead Implant Date: 20150805
Implantable Lead Location: 753860
Lead Channel Pacing Threshold Amplitude: 0.75 V
Lead Channel Pacing Threshold Pulse Width: 0.4 ms
Lead Channel Sensing Intrinsic Amplitude: 16 mV
Lead Channel Setting Pacing Amplitude: 1.5 V
Lead Channel Setting Pacing Amplitude: 2 V
Lead Channel Setting Sensing Sensitivity: 5.6 mV
MDC IDC LEAD LOCATION: 753859
MDC IDC MSMT BATTERY REMAINING LONGEVITY: 158 mo
MDC IDC MSMT BATTERY VOLTAGE: 2.79 V
MDC IDC MSMT LEADCHNL RA IMPEDANCE VALUE: 496 Ohm
MDC IDC MSMT LEADCHNL RA PACING THRESHOLD AMPLITUDE: 0.625 V
MDC IDC MSMT LEADCHNL RA SENSING INTR AMPL: 1.4 mV
MDC IDC MSMT LEADCHNL RV IMPEDANCE VALUE: 576 Ohm
MDC IDC MSMT LEADCHNL RV PACING THRESHOLD PULSEWIDTH: 0.4 ms
MDC IDC SET LEADCHNL RV PACING PULSEWIDTH: 0.4 ms
MDC IDC STAT BRADY AP VP PERCENT: 0 %
MDC IDC STAT BRADY AP VS PERCENT: 35 %
MDC IDC STAT BRADY AS VP PERCENT: 0 %

## 2016-05-24 ENCOUNTER — Ambulatory Visit (HOSPITAL_COMMUNITY)
Admission: RE | Admit: 2016-05-24 | Discharge: 2016-05-24 | Disposition: A | Discharge status: Home / Self Care | Payer: Medicare HMO | Source: Ambulatory Visit | Attending: Cardiovascular Disease | Admitting: Cardiovascular Disease

## 2016-05-24 DIAGNOSIS — I5032 Chronic diastolic (congestive) heart failure: Secondary | ICD-10-CM | POA: Insufficient documentation

## 2016-05-24 DIAGNOSIS — R938 Abnormal findings on diagnostic imaging of other specified body structures: Secondary | ICD-10-CM | POA: Diagnosis not present

## 2016-05-24 DIAGNOSIS — I110 Hypertensive heart disease with heart failure: Secondary | ICD-10-CM | POA: Insufficient documentation

## 2016-05-24 DIAGNOSIS — I771 Stricture of artery: Secondary | ICD-10-CM | POA: Insufficient documentation

## 2016-05-24 DIAGNOSIS — E7800 Pure hypercholesterolemia, unspecified: Secondary | ICD-10-CM | POA: Insufficient documentation

## 2016-05-24 DIAGNOSIS — I739 Peripheral vascular disease, unspecified: Secondary | ICD-10-CM | POA: Diagnosis not present

## 2016-05-24 DIAGNOSIS — K219 Gastro-esophageal reflux disease without esophagitis: Secondary | ICD-10-CM | POA: Diagnosis not present

## 2016-05-24 DIAGNOSIS — E119 Type 2 diabetes mellitus without complications: Secondary | ICD-10-CM | POA: Insufficient documentation

## 2016-06-17 ENCOUNTER — Telehealth: Payer: Self-pay | Admitting: Cardiology

## 2016-06-17 ENCOUNTER — Ambulatory Visit (INDEPENDENT_AMBULATORY_CARE_PROVIDER_SITE_OTHER): Payer: Medicare HMO | Admitting: *Deleted

## 2016-06-17 DIAGNOSIS — I495 Sick sinus syndrome: Secondary | ICD-10-CM

## 2016-06-17 NOTE — Telephone Encounter (Signed)
Confirmed remote transmission w/ pt daughter.   

## 2016-06-18 LAB — CUP PACEART REMOTE DEVICE CHECK
Battery Remaining Longevity: 165 mo
Date Time Interrogation Session: 20170626222629
Implantable Lead Implant Date: 20150805
Implantable Lead Location: 753859
Implantable Lead Location: 753860
Implantable Lead Model: 5076
Lead Channel Pacing Threshold Amplitude: 0.625 V
Lead Channel Pacing Threshold Amplitude: 0.75 V
Lead Channel Pacing Threshold Pulse Width: 0.4 ms
Lead Channel Setting Pacing Amplitude: 1.5 V
Lead Channel Setting Pacing Amplitude: 2 V
Lead Channel Setting Pacing Pulse Width: 0.4 ms
Lead Channel Setting Sensing Sensitivity: 5.6 mV
MDC IDC LEAD IMPLANT DT: 20150805
MDC IDC MSMT BATTERY IMPEDANCE: 109 Ohm
MDC IDC MSMT BATTERY VOLTAGE: 2.79 V
MDC IDC MSMT LEADCHNL RA IMPEDANCE VALUE: 489 Ohm
MDC IDC MSMT LEADCHNL RA PACING THRESHOLD PULSEWIDTH: 0.4 ms
MDC IDC MSMT LEADCHNL RA SENSING INTR AMPL: 1 mV
MDC IDC MSMT LEADCHNL RV IMPEDANCE VALUE: 576 Ohm
MDC IDC MSMT LEADCHNL RV SENSING INTR AMPL: 16 mV
MDC IDC STAT BRADY AP VP PERCENT: 0 %
MDC IDC STAT BRADY AP VS PERCENT: 40 %
MDC IDC STAT BRADY AS VP PERCENT: 0 %
MDC IDC STAT BRADY AS VS PERCENT: 60 %

## 2016-06-18 NOTE — Progress Notes (Signed)
Remote pacemaker transmission.   

## 2016-06-19 ENCOUNTER — Encounter: Payer: Self-pay | Admitting: Cardiology

## 2016-09-10 ENCOUNTER — Ambulatory Visit (INDEPENDENT_AMBULATORY_CARE_PROVIDER_SITE_OTHER): Payer: Medicare HMO | Admitting: Student

## 2016-09-10 ENCOUNTER — Encounter: Payer: Self-pay | Admitting: Student

## 2016-09-10 VITALS — BP 158/76 | HR 82 | Ht 66.0 in | Wt 150.6 lb

## 2016-09-10 DIAGNOSIS — I495 Sick sinus syndrome: Secondary | ICD-10-CM

## 2016-09-10 DIAGNOSIS — L03115 Cellulitis of right lower limb: Secondary | ICD-10-CM

## 2016-09-10 DIAGNOSIS — I5032 Chronic diastolic (congestive) heart failure: Secondary | ICD-10-CM | POA: Diagnosis not present

## 2016-09-10 MED ORDER — CEPHALEXIN 500 MG PO CAPS: 500.0000 mg | ORAL_CAPSULE | Freq: Two times a day (BID) | ORAL | 0 refills | Status: DC

## 2016-09-10 NOTE — Progress Notes (Signed)
Cardiology Office Note    Date:  09/10/2016   ID:  Veronica Young, DOB Nov 08, 1933, MRN [id number]  PCP:  Suzanna Obey, MD  Cardiologist:  Dr. [name] Primary Electrophysiologist: Dr. Lovena Le  Chief Complaint  Patient presents with  . Follow-up    cramping and pain in legs. tender spot on right leg bothering pt.     History of Present Illness:    Veronica Young is a 82 y.o. female with past medical history of PAD (known bilateral popliteal occlusions with conservative management favored), sinus node dysfunction (s/p PPM placement 07/2014), chronic diastolic CHF, Type 2 DM, HTN, HLD, and deafness who presents to the office today for concerns regarding pacemaker and evaluation of a lesion on her leg.   The patient is deaf and an interpreter is present for today's encounter.   Last remote transmission was in 05/2016 and showed appropriate device functioning. In talking with the patient today, she reports doing well. She notes episodes of tenderness along her PPM site which is there with palpation, no pain otherwise. No chest pain with activity, palpitations or dyspnea with exertion. She performs ADL's independently and is able to walk around the store without anginal symptoms.   She does report having a biopsy of her right calf last week due to a lesion which would not heal. She is awaiting the biopsy results but says they were concerned for possible skin cancer. She has been applying Vaseline to the site but notes worsening redness, oozing, and pain at the site. This has continued to progress in severity over the past 2 days. Denies any other non-healing wounds.    Past Medical History:  Diagnosis Date  . Carotid bruit   . Deafness   . Diabetes mellitus    type 2  . Diastolic heart failure (HCC)    EF is 60%  . GERD (gastroesophageal reflux disease)   . Glaucoma   . Hypercholesterolemia   . Hypertension   . Hypothyroidism   . Normal cardiac stress test January 2014  . PAD  (peripheral artery disease) ([place])   . PAT (paroxysmal atrial tachycardia) ([facility])   . Sick sinus syndrome [facility])     Past Surgical History:  Procedure Laterality Date  . cholycystectomy    . left breast cyst removed    . PACEMAKER INSERTION  07-26-2014   MDT ADDRL1 pacemaker implanted by Dr Lovena Le for SSS  . PERMANENT PACEMAKER INSERTION N/A 07/26/2014   Procedure: PERMANENT PACEMAKER INSERTION;  Surgeon: Evans Lance, MD;  Location: [facility] CATH LAB;  Service: Cardiovascular;  Laterality: N/A;  . TOTAL KNEE ARTHROPLASTY     right    Current Medications: Outpatient Medications Prior to Visit  Medication Sig Dispense Refill  . ALPHAGAN P 0.1 % SOLN Place 1 drop into both eyes daily.  12  . amoxicillin (AMOXIL) 500 MG capsule Take 4 capsules by mouth as needed. FOR DENTAL APPOINTMENTS  0  . aspirin EC 81 MG tablet Take 81 mg by mouth every evening.     [name] atorvastatin (LIPITOR) 80 MG tablet Take 80 mg by mouth at bedtime.     [name] atropine 1 % ophthalmic ointment Place 1 application into the left eye daily.  1  . atropine 1 % ophthalmic solution Place 1 drop into the left eye 2 (two) times daily.     . calcium-vitamin D (OSCAL WITH D) 500-200 MG-UNIT per tablet Take 1 tablet by mouth daily with breakfast.    . cholecalciferol (  VITAMIN D) 1000 UNITS tablet Take 1,000 Units by mouth daily.    . cilostazol (PLETAL) 50 MG tablet TAKE 1 TABLET TWICE DAILY 180 tablet 3  . citalopram (CELEXA) 20 MG tablet Take 20 mg by mouth at bedtime.     . dorzolamide-timolol (COSOPT) 22.3-6.8 MG/ML ophthalmic solution Place 1 drop into both eyes 2 (two) times daily.     . hydrALAZINE (APRESOLINE) 25 MG tablet Take 1 tablet (25 mg total) by mouth 3 (three) times daily. 90 tablet 11  . latanoprost (XALATAN) 0.005 % ophthalmic solution Place 1 drop into both eyes daily.  12  . levothyroxine (SYNTHROID, LEVOTHROID) 112 MCG tablet Take 112 mcg by mouth daily before breakfast.     . lisinopril (PRINIVIL,ZESTRIL) 40 MG  tablet TAKE 1 TABLET EVERY DAY 90 tablet 3  . OVER THE COUNTER MEDICATION Take 1 tablet by mouth daily. Herbal supplement to help circulation in legs    . potassium chloride (K-DUR) 10 MEQ tablet Take 10 mEq by mouth once a week. Takes on Monday    . potassium chloride (K-DUR,KLOR-CON) 10 MEQ tablet Take 10 mEq by mouth daily.     No facility-administered medications prior to visit.      Allergies:   Amlodipine and Benicar [olmesartan medoxomil]   Social History   Social History  . Marital status: Widowed    Spouse name: N/A  . Number of children: 4  . Years of education: N/A   Social History Main Topics  . Smoking status: Never Smoker  . Smokeless tobacco: Never Used  . Alcohol use No  . Drug use: No  . Sexual activity: No   Other Topics Concern  . None   Social History Narrative  . None     Family History:  The patient's family history includes Heart disease in her brother, mother, and sister; Liver cancer in her father; Stroke in her sister.   Review of Systems:   Please see the history of present illness.    Review of Systems  Constitution: Negative for chills, decreased appetite, fever and weakness.  Eyes: Negative for blurred vision and redness.  Cardiovascular: Negative for chest pain, dyspnea on exertion, irregular heartbeat, palpitations and syncope.  Respiratory: Negative for cough, shortness of breath and wheezing.   Hematologic/Lymphatic: Negative for bleeding problem. Does not bruise/bleed easily.  Skin: Positive for poor wound healing and suspicious lesions. Negative for rash.  Gastrointestinal: Negative for abdominal pain, constipation, diarrhea, nausea and vomiting.  Psychiatric/Behavioral: Negative for altered mental status. The patient is not nervous/anxious.    All other systems reviewed and are negative.   Physical Exam:    VS:  BP (!) 158/76   Pulse 82   Ht 5\' 6"  (1.676 m)   Wt 150 lb 9.6 oz (68.3 kg)   BMI 24.31 kg/m    General: Well  developed, well nourished,female appearing in no acute distress. Head: Normocephalic, atraumatic, sclera non-icteric, no xanthomas, nares are without discharge.  Neck: No carotid bruits. JVD not elevated.  Lungs: Respirations regular and unlabored, without wheezes or rales.  Heart: Regular rate and rhythm. No S3 or S4.  No murmur, no rubs, or gallops appreciated. Tender to palpation along PPM site. No evidence of bleeding or infection.  Abdomen: Soft, non-tender, non-distended with normoactive bowel sounds. No hepatomegaly. No rebound/guarding. No obvious abdominal masses. Msk:  Strength and tone appear normal for age. No joint deformities or effusions. Extremities: No clubbing or cyanosis. No edema.  Distal pedal pulses are  2+ bilaterally. Lesion along RLE with erythema and swelling along boundary of lesion. Mild yellow drainage present in center of lesion. Neuro: Alert and oriented X 3. Moves all extremities spontaneously. No focal deficits noted. Psych:  Responds to questions appropriately with a normal affect. Skin: No rashes or lesions noted  Wt Readings from Last 3 Encounters:  09/10/16 150 lb 9.6 oz (68.3 kg)  12/04/15 155 lb 12.8 oz (70.7 kg)  08/15/15 152 lb 6.4 oz (69.1 kg)     Studies/Labs Reviewed:   EKG:  EKG is ordered today.  The ekg ordered today demonstrates A-paced rhythm, HR 82, with no acute ST or T-wave changes. QTc 420 ms.   Recent Labs: No results found for requested labs within last 8760 hours.   Lipid Panel No results found for: CHOL, TRIG, HDL, CHOLHDL, VLDL, LDLCALC, LDLDIRECT  Reviewed Results:  Echocardiogram: 07/26/2014 Study Conclusions  - Left ventricle: The cavity size was normal. There was mild concentric hypertrophy. Systolic function was vigorous. The estimated ejection fraction was in the range of 65% to 70%. Wall motion was normal; there were no regional wall motion abnormalities. Features are consistent with a pseudonormal  left ventricular filling pattern, with concomitant abnormal relaxation and increased filling pressure (grade 2 diastolic dysfunction). There was no evidence of elevated ventricular filling pressure by Doppler parameters. - Aortic valve: There was no regurgitation. - Aortic root: The aortic root was normal in size. - Mitral valve: There was mild regurgitation. - Left atrium: The atrium was mildly dilated. - Tricuspid valve: There was mild regurgitation. - Pulmonic valve: There was no regurgitation. - Pulmonary arteries: Systolic pressure was moderately increased. PA peak pressure: 50 mm Hg (S). - Pericardium, extracardiac: There was no pericardial effusion.  Impressions:  - Normal biventricular size and function. Grade 2 diastolic dysfunction with normal filling pressures. Moderate pulmonary hypertension. Mild mitral and tricuspid regurgitation.  Assessment:    1. Sinus node dysfunction (HCC)   2. Cellulitis of right lower extremity   3. Chronic diastolic CHF (congestive heart failure) ([place])      Plan:   In order of problems listed above:  1. Sinus Node Dysfunction - s/p PPM placement in 07/2014. Remote transmission in 05/2016 showed appropriate device functioning. - is tender to palpation along the border of her PPM, no evidence of bleeding or infection noted on exam. No acute changes in treatment plan at this time, as site looks stable. No chest discomfort with exertion. Likely MSK or neuropathic pain.  - has follow-up with Dr. Lovena Le in 2 weeks.  2. Cellulitis of Right Lower Extremity - underwent a biopsy of her right calf last week due to a lesion which would not heal. Has been applying Vaseline to the site but notes worsening redness, oozing, and pain for the past few days. No fevers or chills noted. - site is erythematous and indurated along the border with scant yellow drainage at the epicenter of the lesion, concerning for cellulitis. - will treat with  Keflex 500mg  BID for 7 days. If no improvement in her symptoms, should follow-up with her PCP.   3. Chronic Diastolic CHF - echo in 67/6720 showed EF of 65-70% with no wall motion abnormalities.  - does not appear volume overloaded on physical exam.   Medication Adjustments/Labs and Tests Ordered: Current medicines are reviewed at length with the patient today.  Concerns regarding medicines are outlined above.  Medication changes, Labs and Tests ordered today are listed in the Patient Instructions below. Patient Instructions  Medications:  START Kelflex (Cefalexin) 500 mg--1 tab twice daily for 7 days.  Follow-Up:  Keep your scheduled appointments.  If you need a refill on your cardiac medications before your next appointment, please call your pharmacy.     Signed, Erma Heritage, PA  09/10/2016 1:17 PM    [facility] [place], [place], [place]  [address] Phone: [phone]; Fax: [phone]  [address] [place], [place] [address] Phone: [phone]

## 2016-09-10 NOTE — Patient Instructions (Addendum)
Medications:  START Kelflex (Cefalexin) 500 mg--1 tab twice daily for 7 days.  Follow-Up:  Keep your scheduled appointments.  If you need a refill on your cardiac medications before your next appointment, please call your pharmacy.

## 2016-09-11 ENCOUNTER — Telehealth: Payer: Self-pay | Admitting: Student

## 2016-09-11 ENCOUNTER — Encounter: Payer: Self-pay | Admitting: Internal Medicine

## 2016-09-11 MED ORDER — CEPHALEXIN 500 MG PO CAPS: 500.0000 mg | ORAL_CAPSULE | Freq: Two times a day (BID) | ORAL | 0 refills | Status: DC

## 2016-09-11 NOTE — Telephone Encounter (Signed)
Called [employer] and cancelled mail order dispense.

## 2016-09-11 NOTE — Telephone Encounter (Signed)
New message    Pt interpreter called and verbalized that pt is trying to get the prescription for the medication that [place] prescribed Cefalexin 500mg   Pt stated that she went to the pharmacy and there was no medication that was sent over from [facility]

## 2016-09-11 NOTE — Telephone Encounter (Signed)
Spoke w/ patient via [employer], [employer], acknowledged Rx was mistakenly sent to [facility].  Sent Rx to [facility] today. Will contact [employer] to cancel mail order dispense.

## 2016-09-15 ENCOUNTER — Telehealth: Payer: Self-pay | Admitting: Student

## 2016-09-15 NOTE — Telephone Encounter (Signed)
  Patient's mom called in regards to her left leg wound. Says the area has continued to exhibit erythema. Unsure if the erythema has spread since her recent appointment. No fever or chills. Has good energy and eating well.   Encouraged her to mark the redness to monitor for spreading erythema. She will contact the Dermatologist tomorrow who initially took a skin cancer off the area. Encouraged her that if the redness continues to worsen or if she develops fever/chills, then should proceed to the ED.  She voiced understanding of this and was appreciative of the call.   Signed, Erma Heritage, PA-C 09/15/2016, 10:03 AM Pager: [phone]

## 2016-09-25 ENCOUNTER — Encounter: Payer: Self-pay | Admitting: Internal Medicine

## 2016-09-25 ENCOUNTER — Ambulatory Visit (INDEPENDENT_AMBULATORY_CARE_PROVIDER_SITE_OTHER): Payer: Medicare HMO | Admitting: Internal Medicine

## 2016-09-25 VITALS — BP 162/80 | HR 71 | Ht 66.0 in | Wt 150.0 lb

## 2016-09-25 DIAGNOSIS — I495 Sick sinus syndrome: Secondary | ICD-10-CM | POA: Diagnosis not present

## 2016-09-25 DIAGNOSIS — Z950 Presence of cardiac pacemaker: Secondary | ICD-10-CM | POA: Diagnosis not present

## 2016-09-25 LAB — CUP PACEART INCLINIC DEVICE CHECK
Brady Statistic AS VP Percent: 0 %
Brady Statistic AS VS Percent: 56 %
Date Time Interrogation Session: 20171004133350
Implantable Lead Implant Date: 20150805
Implantable Lead Location: 753859
Implantable Lead Location: 753860
Implantable Lead Model: 5076
Lead Channel Impedance Value: 496 Ohm
Lead Channel Pacing Threshold Amplitude: 0.75 V
Lead Channel Pacing Threshold Pulse Width: 0.4 ms
Lead Channel Sensing Intrinsic Amplitude: 1.4 mV
Lead Channel Sensing Intrinsic Amplitude: 22.4 mV
Lead Channel Setting Pacing Amplitude: 2 V
Lead Channel Setting Pacing Pulse Width: 0.4 ms
MDC IDC LEAD IMPLANT DT: 20150805
MDC IDC MSMT BATTERY IMPEDANCE: 132 Ohm
MDC IDC MSMT BATTERY REMAINING LONGEVITY: 157 mo
MDC IDC MSMT BATTERY VOLTAGE: 2.79 V
MDC IDC MSMT LEADCHNL RV IMPEDANCE VALUE: 612 Ohm
MDC IDC MSMT LEADCHNL RV PACING THRESHOLD AMPLITUDE: 0.75 V
MDC IDC MSMT LEADCHNL RV PACING THRESHOLD PULSEWIDTH: 0.4 ms
MDC IDC SET LEADCHNL RA PACING AMPLITUDE: 1.5 V
MDC IDC SET LEADCHNL RV SENSING SENSITIVITY: 5.6 mV
MDC IDC STAT BRADY AP VP PERCENT: 0 %
MDC IDC STAT BRADY AP VS PERCENT: 43 %

## 2016-09-25 NOTE — Progress Notes (Signed)
HPI Ms. Ruane returns today for follow-up. She is a 82 year old woman with sinus node dysfunction who underwent permanent pacemaker insertion over a year ago. In the interim, she has done well. She denies chest pain, shortness of breath, peripheral edema, or syncope. She does not feel palpitations. She has had a sore on her leg which has not healed and not gotten smaller. She has seen her dermatologist. No fever or chills. Her sore has minimal oozing. Minimal surrounding erythema.  Allergies  Allergen Reactions  . Amlodipine Nausea Only       . Benicar [Olmesartan Medoxomil] Other (See Comments)    Reaction unknown     Current Outpatient Prescriptions  Medication Sig Dispense Refill  . ALPHAGAN P 0.1 % SOLN Place 1 drop into both eyes daily.  12  . amoxicillin (AMOXIL) 500 MG capsule Take 4 capsules by mouth as needed. FOR DENTAL APPOINTMENTS  0  . aspirin EC 81 MG tablet Take 81 mg by mouth every evening.     [name] atorvastatin (LIPITOR) 80 MG tablet Take 80 mg by mouth at bedtime.     [name] atropine 1 % ophthalmic ointment Place 1 application into the left eye daily.  1  . atropine 1 % ophthalmic solution Place 1 drop into the left eye 2 (two) times daily.     . calcium-vitamin D (OSCAL WITH D) 500-200 MG-UNIT per tablet Take 1 tablet by mouth daily with breakfast.    . cholecalciferol (VITAMIN D) 1000 UNITS tablet Take 1,000 Units by mouth daily.    . cilostazol (PLETAL) 50 MG tablet Take 50 mg by mouth 2 (two) times daily.    . citalopram (CELEXA) 20 MG tablet Take 20 mg by mouth at bedtime.     . dorzolamide-timolol (COSOPT) 22.3-6.8 MG/ML ophthalmic solution Place 1 drop into both eyes 2 (two) times daily.     . hydrALAZINE (APRESOLINE) 25 MG tablet Take 1 tablet (25 mg total) by mouth 3 (three) times daily. 90 tablet 11  . latanoprost (XALATAN) 0.005 % ophthalmic solution Place 1 drop into both eyes daily.  12  . levothyroxine (SYNTHROID, LEVOTHROID) 112 MCG tablet Take 112 mcg by  mouth daily before breakfast.     . lisinopril (PRINIVIL,ZESTRIL) 40 MG tablet Take 40 mg by mouth daily.    [name] OVER THE COUNTER MEDICATION Take 1 tablet by mouth daily. Herbal supplement to help circulation in legs    . potassium chloride (K-DUR,KLOR-CON) 10 MEQ tablet Take 10 mEq by mouth daily.     No current facility-administered medications for this visit.      Past Medical History:  Diagnosis Date  . Carotid bruit   . Deafness   . Diabetes mellitus    type 2  . Diastolic heart failure (HCC)    EF is 60%  . GERD (gastroesophageal reflux disease)   . Glaucoma   . Hypercholesterolemia   . Hypertension   . Hypothyroidism   . Normal cardiac stress test January 2014  . PAD (peripheral artery disease) ([place])    a. known bilateral popliteal occlusions with conservative management favored.  [name] PAT (paroxysmal atrial tachycardia) ([place])   . Sick sinus syndrome ([place])    a. s/p PPM placement 07/2014    ROS:   All systems reviewed and negative except as noted in the HPI.   Past Surgical History:  Procedure Laterality Date  . cholycystectomy    . left breast cyst removed    . PACEMAKER  INSERTION  07-26-2014   MDT ADDRL1 pacemaker implanted by Dr Lovena Le for SSS  . PERMANENT PACEMAKER INSERTION N/A 07/26/2014   Procedure: PERMANENT PACEMAKER INSERTION;  Surgeon: Evans Lance, MD;  Location: [facility] CATH LAB;  Service: Cardiovascular;  Laterality: N/A;  . TOTAL KNEE ARTHROPLASTY     right     Family History  Problem Relation Age of Onset  . Heart disease Mother   . Liver cancer Father   . Heart disease Sister   . Heart disease Brother   . Stroke Sister      Social History   Social History  . Marital status: Widowed    Spouse name: N/A  . Number of children: 4  . Years of education: N/A   Occupational History  . Not on file.   Social History Main Topics  . Smoking status: Never Smoker  . Smokeless tobacco: Never Used  . Alcohol use No  . Drug use: No  . Sexual  activity: No   Other Topics Concern  . Not on file   Social History Narrative  . No narrative on file     BP (!) 162/80   Pulse 71   Ht 5\' 6"  (1.676 m)   Wt 150 lb (68 kg)   BMI 24.21 kg/m  BP - 154/76 by me  Physical Exam: Well appearing 82 year old woman, NAD HEENT: Unremarkable Neck:  6 cm JVD, no thyromegally Back:  No CVA tenderness Lungs:  Clear with no wheezes, rales, or rhonchi. Pacemaker incision is well healed. HEART:  Regular rate rhythm, no murmurs, no rubs, no clicks Abd:  soft, positive bowel sounds, no organomegally, no rebound, no guarding Ext:  2 plus pulses, no edema, no cyanosis, no clubbing, right medial calf with a 3 cm crusty, raised tender lesion, no purulent drainage but some oozing of serosanguinus fluid, hard to palpation. Skin:  No rashes no nodules Neuro:  CN II through XII intact, motor grossly intact  DEVICE  Normal device function.  See PaceArt for details.   Assess/Plan: 1. Sinus node dysfunction - she is asymptomatic, s/p PPM insertion 2. PPM - her Medtronic DDD PM is working normally. Will recheck in several months. 3. HTN - her blood pressure is elevated. She notes that she checks it at home and it is usually much lower. I suspect some white coat HTN 4. Right leg lesion - I suspect an insect bite with some super infection and hard scab. At this point, I have asked her to wash the area with hot, soapy water twice day, letting the soap stay on for a minute or more before rinsing. I will defer the need for anti-biotics to her medical MD. She is reluctant to go back to her dermatologist at this point.  Mikle Bosworth.D.

## 2016-09-25 NOTE — Patient Instructions (Signed)
Medication Instructions:  Your physician recommends that you continue on your current medications as directed. Please refer to the Current Medication list given to you today.   Labwork: None Ordered   Testing/Procedures: None Ordered   Follow-Up: Your physician wants you to follow-up in: 1 year with Dr. Lovena Le. You will receive a reminder letter in the mail two months in advance. If you don't receive a letter, please call our office to schedule the follow-up appointment..  Remote monitoring is used to monitor your Pacemaker from home. This monitoring reduces the number of office visits required to check your device to one time per year. It allows [place] to keep an eye on the functioning of your device to ensure it is working properly. You are scheduled for a device check from home on 12/25/16. You may send your transmission at any time that day. If you have a wireless device, the transmission will be sent automatically. After your physician reviews your transmission, you will receive a postcard with your next transmission date.    Any Other Special Instructions Will Be Listed Below (If Applicable).     If you need a refill on your cardiac medications before your next appointment, please call your pharmacy.

## 2016-12-06 ENCOUNTER — Ambulatory Visit (INDEPENDENT_AMBULATORY_CARE_PROVIDER_SITE_OTHER): Payer: Medicare HMO | Admitting: Cardiovascular Disease

## 2016-12-06 VITALS — BP 170/60 | HR 88 | Ht 66.0 in | Wt 147.2 lb

## 2016-12-06 DIAGNOSIS — I739 Peripheral vascular disease, unspecified: Secondary | ICD-10-CM | POA: Diagnosis not present

## 2016-12-06 NOTE — Patient Instructions (Addendum)
Medication Instructions:  Your physician recommends that you continue on your current medications as directed. Please refer to the Current Medication list given to you today.   Labwork: none  Testing/Procedures: Your physician has requested that you have an ankle brachial index (ABI). During this test an ultrasound and blood pressure cuff are used to evaluate the arteries that supply the arms and legs with blood. Allow thirty minutes for this exam. There are no restrictions or special instructions.  Please schedule in 60 minute lower extremity doppler appt time.  To be done in June 2018    Follow-Up: Your physician wants you to follow-up in: 12 months with Dr. [name] You will receive a reminder letter in the mail two months in advance. If you don't receive a letter, please call our office to schedule the follow-up appointment.   Any Other Special Instructions Will Be Listed Below (If Applicable).     If you need a refill on your cardiac medications before your next appointment, please call your pharmacy.

## 2016-12-06 NOTE — Progress Notes (Signed)
Chief Complaint  Patient presents with  . Follow-up     History of Present Illness: 82 yo WF with history of sinus node dysfunction s/p PPM, chronic diastolic CHF, HTN, DM, HLD, deafness, GERD, hypothyroidism and PAD who is here today for PV follow up. She is followed by Dr. [name]. I have followed her PAD for years. She had a CT angiogram of the distal aorta and bilateral lower extremities in April 2013 which showed bilateral popliteal artery occlusions. She does not have flow limiting disease in the iliacs, common femorals or proximal or mid segments of bilateral SFA. She has done well over the last few years with conservative management. Stable ABI June 2017.  Dr. Lovena Le follows her pacemaker.   She is here for f/u and feels great. She has no leg pain with walking. No rest pain during day or ulcerations. Some pain across shins at night while in bed.  No chest pain or SOB.   Primary Care Physician: Suzanna Obey, MD   Past Medical History:  Diagnosis Date  . Carotid bruit   . Deafness   . Diabetes mellitus    type 2  . Diastolic heart failure (HCC)    EF is 60%  . GERD (gastroesophageal reflux disease)   . Glaucoma   . Hypercholesterolemia   . Hypertension   . Hypothyroidism   . Normal cardiac stress test January 2014  . PAD (peripheral artery disease) ([place])    a. known bilateral popliteal occlusions with conservative management favored.  [name] PAT (paroxysmal atrial tachycardia) ([place])   . Sick sinus syndrome [facility])    a. s/p PPM placement 07/2014    Past Surgical History:  Procedure Laterality Date  . cholycystectomy    . left breast cyst removed    . PACEMAKER INSERTION  07-26-2014   MDT ADDRL1 pacemaker implanted by Dr Lovena Le for SSS  . PERMANENT PACEMAKER INSERTION N/A 07/26/2014   Procedure: PERMANENT PACEMAKER INSERTION;  Surgeon: Evans Lance, MD;  Location: [facility] CATH LAB;  Service: Cardiovascular;  Laterality: N/A;  . TOTAL KNEE ARTHROPLASTY     right    Current  Outpatient Prescriptions  Medication Sig Dispense Refill  . ALPHAGAN P 0.1 % SOLN Place 1 drop into both eyes daily.  12  . amoxicillin (AMOXIL) 500 MG capsule Take 2,000 mg by mouth as directed. 1 hour prior to dental appts    . aspirin EC 81 MG tablet Take 81 mg by mouth every evening.     [name] atorvastatin (LIPITOR) 80 MG tablet Take 80 mg by mouth at bedtime.     [name] atropine 1 % ophthalmic ointment Place 1 application into the left eye daily.  1  . atropine 1 % ophthalmic solution Place 1 drop into the left eye 2 (two) times daily.     . calcium-vitamin D (OSCAL WITH D) 500-200 MG-UNIT per tablet Take 1 tablet by mouth daily with breakfast.    . cholecalciferol (VITAMIN D) 1000 UNITS tablet Take 1,000 Units by mouth daily.    . cilostazol (PLETAL) 50 MG tablet Take 50 mg by mouth 2 (two) times daily.    . citalopram (CELEXA) 20 MG tablet Take 20 mg by mouth at bedtime.     . dorzolamide-timolol (COSOPT) 22.3-6.8 MG/ML ophthalmic solution Place 1 drop into both eyes 2 (two) times daily.     . hydrALAZINE (APRESOLINE) 25 MG tablet Take 1 tablet (25 mg total) by mouth 3 (three) times daily. 90 tablet  11  . latanoprost (XALATAN) 0.005 % ophthalmic solution Place 1 drop into both eyes daily.  12  . levothyroxine (SYNTHROID, LEVOTHROID) 112 MCG tablet Take 112 mcg by mouth daily before breakfast.     . lisinopril (PRINIVIL,ZESTRIL) 40 MG tablet Take 40 mg by mouth daily.    [name] OVER THE COUNTER MEDICATION Take 1 tablet by mouth daily. Herbal supplement to help circulation in legs    . potassium chloride (K-DUR,KLOR-CON) 10 MEQ tablet Take 10 mEq by mouth daily.     No current facility-administered medications for this visit.     Allergies  Allergen Reactions  . Amlodipine Nausea Only       . Benicar [Olmesartan Medoxomil] Other (See Comments)    Reaction unknown    Social History   Social History  . Marital status: Widowed    Spouse name: N/A  . Number of children: 4  . Years of  education: N/A   Occupational History  . Not on file.   Social History Main Topics  . Smoking status: Never Smoker  . Smokeless tobacco: Never Used  . Alcohol use No  . Drug use: No  . Sexual activity: No   Other Topics Concern  . Not on file   Social History Narrative  . No narrative on file    Family History  Problem Relation Age of Onset  . Heart disease Mother   . Liver cancer Father   . Heart disease Sister   . Heart disease Brother   . Stroke Sister     Review of Systems:  As stated in the HPI and otherwise negative.   BP (!) 170/60   Pulse 88   Ht 5\' 6"  (1.676 m)   Wt 147 lb 3.2 oz (66.8 kg)   BMI 23.76 kg/m   Physical Examination: General: Well developed, well nourished, NAD  HEENT: OP clear, mucus membranes moist  SKIN: warm, dry. No rashes. Neuro: No focal deficits  Musculoskeletal: Muscle strength 5/5 all ext  Psychiatric: Mood and affect normal  Neck: No JVD, no carotid bruits, no thyromegaly, no lymphadenopathy.  Lungs:Clear bilaterally, no wheezes, rhonci, crackles Cardiovascular: Regular rate and rhythm. No murmurs, gallops or rubs. Abdomen:Soft. Bowel sounds present. Non-tender.  Extremities: No lower extremity edema. Pulses are non-palpable in the bilateral DP/PT.  EKG:  EKG is not ordered today. The ekg ordered today demonstrates   Recent Labs: No results found for requested labs within last 8760 hours.   Lipid Panel No results found for: CHOL, TRIG, HDL, CHOLHDL, VLDL, LDLCALC, LDLDIRECT   Wt Readings from Last 3 Encounters:  12/06/16 147 lb 3.2 oz (66.8 kg)  09/25/16 150 lb (68 kg)  09/10/16 150 lb 9.6 oz (68.3 kg)     Other studies Reviewed: Additional studies/ records that were reviewed today include: . Review of the above records demonstrates:   Assessment and Plan:   1. PAD: She has severe disease in both legs with bilateral popliteal artery occlusions. Will continue to manage conservatively with Pletal 50 mg po BID. Will  repeat ABI yearly. Due for ABI This will be due again in June 2018. I have explained that stenting is not effective behind the knee joint as risk of occlusion is high. If she develops rest pain or ulcerations, will have to consider bypass. Since I am not performing PV procedures anymore, I will ask Dr. [name] to check her yearly LE dopplers and if there is a change or if she has a change in  symptoms, she will need to establish in our [facility] clinic with Dr. Gwenlyn Found or Dr. Fletcher Anon.   2. HTN: BP elevated today but pt reports good control at home.   Current medicines are reviewed at length with the patient today.  The patient does not have concerns regarding medicines.  The following changes have been made:  no change  Labs/ tests ordered today include:  No orders of the defined types were placed in this encounter.    Disposition:   FU with Dr. [name] in one year.    Signed, Lauree Chandler, MD 12/06/2016 11:03 AM    [address], [place], [place]  [address] Phone: [phone]; Fax: [phone]

## 2016-12-25 ENCOUNTER — Ambulatory Visit (INDEPENDENT_AMBULATORY_CARE_PROVIDER_SITE_OTHER): Payer: Medicare HMO | Admitting: *Deleted

## 2016-12-25 DIAGNOSIS — I495 Sick sinus syndrome: Secondary | ICD-10-CM

## 2016-12-26 ENCOUNTER — Telehealth: Payer: Self-pay | Admitting: Cardiology

## 2016-12-26 NOTE — Telephone Encounter (Signed)
LMOVM reminding pt to send remote transmission.   

## 2016-12-27 ENCOUNTER — Encounter: Payer: Self-pay | Admitting: Cardiology

## 2016-12-27 NOTE — Progress Notes (Signed)
Remote pacemaker transmission.   

## 2017-01-03 LAB — CUP PACEART REMOTE DEVICE CHECK
Battery Impedance: 132 Ohm
Brady Statistic AS VP Percent: 1 %
Brady Statistic AS VS Percent: 58 %
Date Time Interrogation Session: 20180105035112
Implantable Lead Implant Date: 20150805
Implantable Lead Location: 753859
Implantable Lead Model: 5076
Lead Channel Impedance Value: 574 Ohm
Lead Channel Pacing Threshold Amplitude: 0.625 V
Lead Channel Pacing Threshold Amplitude: 0.625 V
Lead Channel Pacing Threshold Pulse Width: 0.4 ms
Lead Channel Setting Pacing Amplitude: 2 V
Lead Channel Setting Sensing Sensitivity: 5.6 mV
MDC IDC LEAD IMPLANT DT: 20150805
MDC IDC LEAD LOCATION: 753860
MDC IDC MSMT BATTERY REMAINING LONGEVITY: 157 mo
MDC IDC MSMT BATTERY VOLTAGE: 2.8 V
MDC IDC MSMT LEADCHNL RA IMPEDANCE VALUE: 469 Ohm
MDC IDC MSMT LEADCHNL RV PACING THRESHOLD PULSEWIDTH: 0.4 ms
MDC IDC PG IMPLANT DT: 20150804
MDC IDC SET LEADCHNL RA PACING AMPLITUDE: 1.5 V
MDC IDC SET LEADCHNL RV PACING PULSEWIDTH: 0.4 ms
MDC IDC STAT BRADY AP VP PERCENT: 1 %
MDC IDC STAT BRADY AP VS PERCENT: 41 %

## 2017-02-26 NOTE — Progress Notes (Signed)
Cardiology Office Note    Date:  02/28/2017   ID:  Veronica Young, DOB 1933/03/14, MRN [id number]  PCP:  Suzanna Obey, MD  Cardiologist:  Dr. [name] Primary Electrophysiologist: Dr. Lovena Le  Chief Complaint  Patient presents with  . Follow-up  . Hypertension    History of Present Illness:    Veronica Young is a 83 y.o. female with past medical history of PAD (known bilateral popliteal occlusions with conservative management favored), sinus node dysfunction (s/p PPM placement 07/2014), chronic diastolic CHF, Type 2 DM, HTN, HLD, and deafness who presents to the office today for follow up  The patient is deaf and an interpreter is present for today's encounter.   Last remote transmission was in Jan 2018 and showed appropriate device functioning. She did have some AHR episodes c/w atrial tachycardia.  In talking with the patient today, she reports doing well.  No chest pain with activity, palpitations or dyspnea with exertion. She performs ADL's independently. She is followed by Dr. Angelena Form for her PAD. She states her only problem is that her blood pressure remains high. States it is 170-180/90 at home. She is intolerant of amlodipine and ARB.    Past Medical History:  Diagnosis Date  . Carotid bruit   . Deafness   . Diabetes mellitus    type 2  . Diastolic heart failure (HCC)    EF is 60%  . GERD (gastroesophageal reflux disease)   . Glaucoma   . Hypercholesterolemia   . Hypertension   . Hypothyroidism   . Normal cardiac stress test January 2014  . PAD (peripheral artery disease) ([place])    a. known bilateral popliteal occlusions with conservative management favored.  [name] PAT (paroxysmal atrial tachycardia) ([place])   . Sick sinus syndrome [facility])    a. s/p PPM placement 07/2014    Past Surgical History:  Procedure Laterality Date  . cholycystectomy    . left breast cyst removed    . PACEMAKER INSERTION  07-26-2014   MDT ADDRL1 pacemaker implanted by Dr Lovena Le for SSS  .  PERMANENT PACEMAKER INSERTION N/A 07/26/2014   Procedure: PERMANENT PACEMAKER INSERTION;  Surgeon: Evans Lance, MD;  Location: [facility] CATH LAB;  Service: Cardiovascular;  Laterality: N/A;  . TOTAL KNEE ARTHROPLASTY     right    Current Medications: Outpatient Medications Prior to Visit  Medication Sig Dispense Refill  . ALPHAGAN P 0.1 % SOLN Place 1 drop into both eyes daily.  12  . amoxicillin (AMOXIL) 500 MG capsule Take 2,000 mg by mouth as directed. 1 hour prior to dental appts    . aspirin EC 81 MG tablet Take 81 mg by mouth every evening.     [name] atorvastatin (LIPITOR) 80 MG tablet Take 80 mg by mouth at bedtime.     [name] atropine 1 % ophthalmic ointment Place 1 application into the left eye daily.  1  . atropine 1 % ophthalmic solution Place 1 drop into the left eye 2 (two) times daily.     . calcium-vitamin D (OSCAL WITH D) 500-200 MG-UNIT per tablet Take 1 tablet by mouth daily with breakfast.    . cholecalciferol (VITAMIN D) 1000 UNITS tablet Take 1,000 Units by mouth daily.    . cilostazol (PLETAL) 50 MG tablet Take 50 mg by mouth 2 (two) times daily.    . citalopram (CELEXA) 20 MG tablet Take 20 mg by mouth at bedtime.     . dorzolamide-timolol (COSOPT) 22.3-6.8 MG/ML ophthalmic solution  Place 1 drop into both eyes 2 (two) times daily.     [name] latanoprost (XALATAN) 0.005 % ophthalmic solution Place 1 drop into both eyes daily.  12  . levothyroxine (SYNTHROID, LEVOTHROID) 112 MCG tablet Take 112 mcg by mouth daily before breakfast.     . lisinopril (PRINIVIL,ZESTRIL) 40 MG tablet Take 40 mg by mouth daily.    [name] OVER THE COUNTER MEDICATION Take 1 tablet by mouth daily. Herbal supplement to help circulation in legs    . potassium chloride (K-DUR,KLOR-CON) 10 MEQ tablet Take 10 mEq by mouth daily.    . hydrALAZINE (APRESOLINE) 25 MG tablet Take 1 tablet (25 mg total) by mouth 3 (three) times daily. 90 tablet 11   No facility-administered medications prior to visit.      Allergies:    Amlodipine and Benicar [olmesartan medoxomil]   Social History   Social History  . Marital status: Widowed    Spouse name: N/A  . Number of children: 4  . Years of education: N/A   Social History Main Topics  . Smoking status: Never Smoker  . Smokeless tobacco: Never Used  . Alcohol use No  . Drug use: No  . Sexual activity: No   Other Topics Concern  . None   Social History Narrative  . None     Family History:  The patient's family history includes Heart disease in her brother, mother, and sister; Liver cancer in her father; Stroke in her sister.   Review of Systems:   Please see the history of present illness.    Review of Systems  Constitution: Negative for chills, decreased appetite, fever and weakness.  Eyes: Negative for blurred vision and redness.  Cardiovascular: Negative for chest pain, dyspnea on exertion, irregular heartbeat, palpitations and syncope.  Respiratory: Negative for cough, shortness of breath and wheezing.   Hematologic/Lymphatic: Negative for bleeding problem. Does not bruise/bleed easily.  Skin: Negative for poor wound healing, rash and suspicious lesions.  Gastrointestinal: Negative for abdominal pain, constipation, diarrhea, nausea and vomiting.  Psychiatric/Behavioral: Negative for altered mental status. The patient is not nervous/anxious.    All other systems reviewed and are negative.   Physical Exam:    VS:  BP (!) 180/90 (BP Location: Right Arm, Patient Position: Sitting, Cuff Size: Normal)   Pulse 81   Ht 5\' 6"  (1.676 m)   Wt 143 lb 3.2 oz (65 kg)   SpO2 96%   BMI 23.11 kg/m    General: Well developed, well nourished,female appearing in no acute distress. Head: Normocephalic, atraumatic, sclera non-icteric, no xanthomas, nares are without discharge.  Neck: No carotid bruits. JVD not elevated.  Lungs: Respirations regular and unlabored, without wheezes or rales.  Heart: Regular rate and rhythm. No S3 or S4.  No murmur, no rubs, or  gallops appreciated. Tender to palpation along PPM site. No evidence of bleeding or infection.  Abdomen: Soft, non-tender, non-distended with normoactive bowel sounds. No hepatomegaly. No rebound/guarding. No obvious abdominal masses. Msk:  Strength and tone appear normal for age. No joint deformities or effusions. Extremities: No clubbing or cyanosis. No edema.  Distal pedal pulses are 2+ bilaterally.  Neuro: Alert and oriented X 3. Moves all extremities spontaneously. No focal deficits noted. Psych:  Responds to questions appropriately with a normal affect. Skin: No rashes or lesions noted  Wt Readings from Last 3 Encounters:  02/28/17 143 lb 3.2 oz (65 kg)  12/06/16 147 lb 3.2 oz (66.8 kg)  09/25/16 150 lb (68 kg)  Studies/Labs Reviewed:   EKG:  EKG is not ordered today.    Reviewed Results:  Echocardiogram: 07/26/2014 Study Conclusions  - Left ventricle: The cavity size was normal. There was mild concentric hypertrophy. Systolic function was vigorous. The estimated ejection fraction was in the range of 65% to 70%. Wall motion was normal; there were no regional wall motion abnormalities. Features are consistent with a pseudonormal left ventricular filling pattern, with concomitant abnormal relaxation and increased filling pressure (grade 2 diastolic dysfunction). There was no evidence of elevated ventricular filling pressure by Doppler parameters. - Aortic valve: There was no regurgitation. - Aortic root: The aortic root was normal in size. - Mitral valve: There was mild regurgitation. - Left atrium: The atrium was mildly dilated. - Tricuspid valve: There was mild regurgitation. - Pulmonic valve: There was no regurgitation. - Pulmonary arteries: Systolic pressure was moderately increased. PA peak pressure: 50 mm Hg (S). - Pericardium, extracardiac: There was no pericardial effusion.  Impressions:  - Normal biventricular size and function. Grade 2  diastolic dysfunction with normal filling pressures. Moderate pulmonary hypertension. Mild mitral and tricuspid regurgitation.  Assessment:    1. PAD (peripheral artery disease) (HCC)   2. Cardiac pacemaker in situ   3. Essential hypertension   4. Sinus node dysfunction (HCC)      Plan:   In order of problems listed above:  1. Sinus Node Dysfunction - s/p PPM placement in 07/2014. Followed in device clinic.  2. PAD- stable claudication   3. Chronic Diastolic CHF - echo in 17/7116 showed EF of 65-70% with no wall motion abnormalities.  - does not appear volume overloaded on physical exam.  4. HTN. Poorly controlled. Recommend increasing hydralazine to 37.5 mg tid.    Medication Adjustments/Labs and Tests Ordered: Current medicines are reviewed at length with the patient today.  Concerns regarding medicines are outlined above.  Medication changes, Labs and Tests ordered today are listed in the Patient Instructions below. Patient Instructions  Increase Hydralazine (apresoline) to 37.5 mg three times a day. This is 1.5 tablets three times a day. For blood pressure.   I will see you in 6 months.    Signed, Aaralyn Kil [name], MD  02/28/2017 1:20 PM    [facility] [place], [place], [place]  [address] Phone: [phone]; Fax: [phone]  [address] [place], [place] [address] Phone: [phone]

## 2017-02-28 ENCOUNTER — Encounter: Payer: Self-pay | Admitting: Cardiology

## 2017-02-28 ENCOUNTER — Ambulatory Visit (INDEPENDENT_AMBULATORY_CARE_PROVIDER_SITE_OTHER): Payer: Medicare HMO | Admitting: Cardiology

## 2017-02-28 VITALS — BP 180/90 | HR 81 | Ht 66.0 in | Wt 143.2 lb

## 2017-02-28 DIAGNOSIS — Z950 Presence of cardiac pacemaker: Secondary | ICD-10-CM

## 2017-02-28 DIAGNOSIS — I739 Peripheral vascular disease, unspecified: Secondary | ICD-10-CM | POA: Diagnosis not present

## 2017-02-28 DIAGNOSIS — I495 Sick sinus syndrome: Secondary | ICD-10-CM | POA: Diagnosis not present

## 2017-02-28 DIAGNOSIS — I10 Essential (primary) hypertension: Secondary | ICD-10-CM

## 2017-02-28 MED ORDER — HYDRALAZINE HCL 25 MG PO TABS: 37.5000 mg | ORAL_TABLET | Freq: Three times a day (TID) | ORAL | 11 refills | Status: DC

## 2017-02-28 NOTE — Patient Instructions (Addendum)
Increase Hydralazine (apresoline) to 37.5 mg three times a day. This is 1.5 tablets three times a day. For blood pressure.   I will see you in 6 months.

## 2017-03-27 ENCOUNTER — Encounter: Payer: Medicare HMO | Admitting: *Deleted

## 2017-03-27 ENCOUNTER — Telehealth: Payer: Self-pay | Admitting: Cardiology

## 2017-03-27 NOTE — Telephone Encounter (Signed)
LMOVM reminding pt to send remote transmission.   

## 2017-03-28 ENCOUNTER — Encounter: Payer: Self-pay | Admitting: Cardiology

## 2017-03-28 NOTE — Progress Notes (Signed)
Letter  

## 2017-03-31 ENCOUNTER — Ambulatory Visit (INDEPENDENT_AMBULATORY_CARE_PROVIDER_SITE_OTHER): Payer: Medicare HMO | Admitting: *Deleted

## 2017-03-31 DIAGNOSIS — I495 Sick sinus syndrome: Secondary | ICD-10-CM

## 2017-03-31 NOTE — Progress Notes (Signed)
Remote pacemaker transmission.   

## 2017-04-02 ENCOUNTER — Encounter: Payer: Self-pay | Admitting: Cardiology

## 2017-04-03 LAB — CUP PACEART REMOTE DEVICE CHECK
Battery Impedance: 156 Ohm
Battery Remaining Longevity: 151 mo
Brady Statistic AP VS Percent: 41 %
Brady Statistic AS VP Percent: 1 %
Brady Statistic AS VS Percent: 58 %
Date Time Interrogation Session: 20180408120710
Implantable Lead Implant Date: 20150805
Implantable Lead Implant Date: 20150805
Implantable Lead Location: 753859
Implantable Lead Model: 5076
Lead Channel Impedance Value: 450 Ohm
Lead Channel Impedance Value: 561 Ohm
Lead Channel Pacing Threshold Amplitude: 0.5 V
Lead Channel Pacing Threshold Amplitude: 0.625 V
Lead Channel Pacing Threshold Pulse Width: 0.4 ms
Lead Channel Pacing Threshold Pulse Width: 0.4 ms
Lead Channel Setting Pacing Amplitude: 1.5 V
Lead Channel Setting Pacing Amplitude: 2 V
Lead Channel Setting Pacing Pulse Width: 0.4 ms
Lead Channel Setting Sensing Sensitivity: 5.6 mV
MDC IDC LEAD LOCATION: 753860
MDC IDC MSMT BATTERY VOLTAGE: 2.8 V
MDC IDC MSMT LEADCHNL RA SENSING INTR AMPL: 0.7 mV
MDC IDC MSMT LEADCHNL RV SENSING INTR AMPL: 16 mV
MDC IDC PG IMPLANT DT: 20150804
MDC IDC STAT BRADY AP VP PERCENT: 0 %

## 2017-04-25 ENCOUNTER — Other Ambulatory Visit: Payer: Self-pay | Admitting: Cardiovascular Disease

## 2017-04-25 ENCOUNTER — Other Ambulatory Visit: Payer: Self-pay | Admitting: Internal Medicine

## 2017-04-25 NOTE — Telephone Encounter (Signed)
This is Dr. [name] pt.

## 2017-05-30 ENCOUNTER — Ambulatory Visit (HOSPITAL_COMMUNITY)
Admission: RE | Admit: 2017-05-30 | Discharge: 2017-05-30 | Disposition: A | Discharge status: Home / Self Care | Payer: Medicare HMO | Source: Ambulatory Visit | Attending: Cardiology | Admitting: Cardiology

## 2017-05-30 ENCOUNTER — Encounter (HOSPITAL_COMMUNITY): Payer: Self-pay

## 2017-05-30 DIAGNOSIS — I998 Other disorder of circulatory system: Secondary | ICD-10-CM | POA: Diagnosis not present

## 2017-05-30 DIAGNOSIS — R9439 Abnormal result of other cardiovascular function study: Secondary | ICD-10-CM | POA: Insufficient documentation

## 2017-05-30 DIAGNOSIS — I739 Peripheral vascular disease, unspecified: Secondary | ICD-10-CM | POA: Diagnosis not present

## 2017-05-30 NOTE — Progress Notes (Signed)
Veronica Young, [phone], with [facility] agency did the sign language interpreting for [name] today during the ABI.

## 2017-06-09 ENCOUNTER — Telehealth: Payer: Self-pay | Admitting: Cardiovascular Disease

## 2017-06-09 NOTE — Telephone Encounter (Signed)
New message    Pt is returning call to [place].

## 2017-06-09 NOTE — Telephone Encounter (Signed)
Pt notified of ABI results through deaf interpreter.  Pt is having more leg pain at night.  I scheduled her to see Dr.Berry on June 22,2018 at 11:20 for PV consult.

## 2017-06-10 ENCOUNTER — Telehealth: Payer: Self-pay | Admitting: Cardiovascular Disease

## 2017-06-10 NOTE — Telephone Encounter (Signed)
Left message to call back  

## 2017-06-10 NOTE — Telephone Encounter (Signed)
New message ° ° ° °Pt verbalized that she is returning call for rn  °

## 2017-06-11 NOTE — Telephone Encounter (Signed)
Left message on voicemail of upcoming appointment with dr berry. To call back with questions.

## 2017-06-13 ENCOUNTER — Encounter: Payer: Self-pay | Admitting: Cardiovascular Disease

## 2017-06-13 ENCOUNTER — Ambulatory Visit (INDEPENDENT_AMBULATORY_CARE_PROVIDER_SITE_OTHER): Payer: Medicare HMO | Admitting: Cardiovascular Disease

## 2017-06-13 VITALS — BP 186/89 | HR 81 | Ht 66.0 in | Wt 142.0 lb

## 2017-06-13 DIAGNOSIS — I739 Peripheral vascular disease, unspecified: Secondary | ICD-10-CM

## 2017-06-13 DIAGNOSIS — I10 Essential (primary) hypertension: Secondary | ICD-10-CM | POA: Diagnosis not present

## 2017-06-13 MED ORDER — CILOSTAZOL 100 MG PO TABS: 100.0000 mg | ORAL_TABLET | Freq: Two times a day (BID) | ORAL | 6 refills | Status: DC

## 2017-06-13 NOTE — Assessment & Plan Note (Signed)
Veronica Young was referred to me by Dr. [name] for lifestyle limiting claudication. This has gotten progressively worse over time. She is independent and can walk a mile before she is symptomatic. She is on Pletal 50 mg by mouth twice a day. Most recent Dopplers performed 05/30/17 revealed a right ABI of 0.46 and a left upper thigh. There is a history of bilateral popliteal artery occlusions. I'm not sure whether or not we should pursue an interventional approach although the patient is fairly independent. We will increase her Pletal to 100 mg by mouth twice a day and see her back in 3 months at which time we will reassess.

## 2017-06-13 NOTE — Progress Notes (Signed)
06/13/2017 Veronica Young   10/05/1933  [id number]  Primary Physician Briscoe, Jannifer Rodney, MD Primary Cardiologist: Lorretta Harp MD Renae Gloss  HPI:  Ms Veronica Young Is a 83 year old thin appearing Caucasian female was accompanied by an interpreter. She is deaf. She is a patient of Dr. Doug Sou with a history of hypertension, hyperlipidemia, permanent transvenous pacemaker insertion 6/29 and diastolic heart failure with a normal EF. She lives independently. She does complain of lifestyle limiting claudication having to stop out approximately 1 mile. She is on low-dose Pletal with Dopplers that showed ABIs in the 0.5 range bilaterally with known occluded popliteal arteries.   Current Outpatient Prescriptions  Medication Sig Dispense Refill  . ALPHAGAN P 0.1 % SOLN Place 1 drop into both eyes daily.  12  . amoxicillin (AMOXIL) 500 MG capsule Take 2,000 mg by mouth as directed. 1 hour prior to dental appts    . aspirin EC 81 MG tablet Take 81 mg by mouth every evening.     [name] atorvastatin (LIPITOR) 80 MG tablet Take 80 mg by mouth at bedtime.     [name] atropine 1 % ophthalmic ointment Place 1 application into the left eye daily.  1  . atropine 1 % ophthalmic solution Place 1 drop into the left eye 2 (two) times daily.     . calcium-vitamin D (OSCAL WITH D) 500-200 MG-UNIT per tablet Take 1 tablet by mouth daily with breakfast.    . cholecalciferol (VITAMIN D) 1000 UNITS tablet Take 1,000 Units by mouth daily.    . cilostazol (PLETAL) 100 MG tablet Take 1 tablet (100 mg total) by mouth 2 (two) times daily. 60 tablet 6  . citalopram (CELEXA) 20 MG tablet Take 20 mg by mouth at bedtime.     . dorzolamide-timolol (COSOPT) 22.3-6.8 MG/ML ophthalmic solution Place 1 drop into both eyes 2 (two) times daily.     . hydrALAZINE (APRESOLINE) 25 MG tablet Take 1.5 tablets (37.5 mg total) by mouth 3 (three) times daily. 120 tablet 11  . latanoprost (XALATAN) 0.005 % ophthalmic solution Place 1 drop  into both eyes daily.  12  . levothyroxine (SYNTHROID, LEVOTHROID) 112 MCG tablet Take 112 mcg by mouth daily before breakfast.     . lisinopril (PRINIVIL,ZESTRIL) 40 MG tablet Take 1 tablet (40 mg total) by mouth daily. 90 tablet 1  . OVER THE COUNTER MEDICATION Take 1 tablet by mouth daily. Herbal supplement to help circulation in legs    . potassium chloride (K-DUR,KLOR-CON) 10 MEQ tablet Take 10 mEq by mouth daily.     No current facility-administered medications for this visit.     Allergies  Allergen Reactions  . Amlodipine Nausea Only       . Benicar [Olmesartan Medoxomil] Other (See Comments)    Reaction unknown    Social History   Social History  . Marital status: Widowed    Spouse name: N/A  . Number of children: 4  . Years of education: N/A   Occupational History  . Not on file.   Social History Main Topics  . Smoking status: Never Smoker  . Smokeless tobacco: Never Used  . Alcohol use No  . Drug use: No  . Sexual activity: No   Other Topics Concern  . Not on file   Social History Narrative  . No narrative on file     Review of Systems: General: negative for chills, fever, night sweats or weight changes.  Cardiovascular: negative for  chest pain, dyspnea on exertion, edema, orthopnea, palpitations, paroxysmal nocturnal dyspnea or shortness of breath Dermatological: negative for rash Respiratory: negative for cough or wheezing Urologic: negative for hematuria Abdominal: negative for nausea, vomiting, diarrhea, bright red blood per rectum, melena, or hematemesis Neurologic: negative for visual changes, syncope, or dizziness All other systems reviewed and are otherwise negative except as noted above.    Blood pressure (!) 186/89, pulse 81, height 5\' 6"  (1.676 m), weight 142 lb (64.4 kg).  General appearance: alert and no distress Neck: no adenopathy, no carotid bruit, no JVD, supple, symmetrical, trachea midline and thyroid not enlarged, symmetric, no  tenderness/mass/nodules Lungs: clear to auscultation bilaterally Heart: regular rate and rhythm, S1, S2 normal, no murmur, click, rub or gallop Extremities: extremities normal, atraumatic, no cyanosis or edema  EKG Atrially paced rhythm at a heart rate of 81. I personally reviewed this  ASSESSMENT AND PLAN:   PAD (peripheral artery disease) ([place]) Ms. Veronica Young was referred to me by Dr. [name] for lifestyle limiting claudication. This has gotten progressively worse over time. She is independent and can walk a mile before she is symptomatic. She is on Pletal 50 mg by mouth twice a day. Most recent Dopplers performed 05/30/17 revealed a right ABI of 0.46 and a left upper thigh. There is a history of bilateral popliteal artery occlusions. I'm not sure whether or not we should pursue an interventional approach although the patient is fairly independent. We will increase her Pletal to 100 mg by mouth twice a day and see her back in 3 months at which time we will reassess.      Lorretta Harp MD FACP,FACC,FAHA, [facility] 06/13/2017 11:42 AM

## 2017-06-13 NOTE — Patient Instructions (Signed)
Medication Instructions: Increase Pletal (Cilostazol) to 100 mg twice daily.   Testing/Procedures: Your physician has requested that you have a lower extremity arterial duplex. During this test, ultrasound is used to evaluate arterial blood flow in the legs. Allow one hour for this exam. There are no restrictions or special instructions.   Follow-Up: Your physician recommends that you schedule a follow-up appointment in: 3 months with Dr. Gwenlyn Found.

## 2017-06-16 ENCOUNTER — Emergency Department (HOSPITAL_COMMUNITY): Payer: Medicare HMO

## 2017-06-16 ENCOUNTER — Observation Stay (HOSPITAL_COMMUNITY)
Admission: EM | Admit: 2017-06-16 | Discharge: 2017-06-17 | Disposition: A | Discharge status: Home / Self Care | Payer: Medicare HMO | Attending: Cardiology | Admitting: Cardiology

## 2017-06-16 ENCOUNTER — Encounter (HOSPITAL_COMMUNITY): Payer: Self-pay | Admitting: *Deleted

## 2017-06-16 DIAGNOSIS — I071 Rheumatic tricuspid insufficiency: Secondary | ICD-10-CM | POA: Diagnosis not present

## 2017-06-16 DIAGNOSIS — E039 Hypothyroidism, unspecified: Secondary | ICD-10-CM | POA: Diagnosis not present

## 2017-06-16 DIAGNOSIS — R5383 Other fatigue: Secondary | ICD-10-CM | POA: Insufficient documentation

## 2017-06-16 DIAGNOSIS — I110 Hypertensive heart disease with heart failure: Secondary | ICD-10-CM | POA: Insufficient documentation

## 2017-06-16 DIAGNOSIS — Z950 Presence of cardiac pacemaker: Secondary | ICD-10-CM | POA: Insufficient documentation

## 2017-06-16 DIAGNOSIS — I493 Ventricular premature depolarization: Secondary | ICD-10-CM | POA: Insufficient documentation

## 2017-06-16 DIAGNOSIS — Z7982 Long term (current) use of aspirin: Secondary | ICD-10-CM | POA: Insufficient documentation

## 2017-06-16 DIAGNOSIS — E7800 Pure hypercholesterolemia, unspecified: Secondary | ICD-10-CM | POA: Diagnosis present

## 2017-06-16 DIAGNOSIS — E119 Type 2 diabetes mellitus without complications: Secondary | ICD-10-CM | POA: Diagnosis not present

## 2017-06-16 DIAGNOSIS — R110 Nausea: Secondary | ICD-10-CM | POA: Diagnosis not present

## 2017-06-16 DIAGNOSIS — H9190 Unspecified hearing loss, unspecified ear: Secondary | ICD-10-CM | POA: Insufficient documentation

## 2017-06-16 DIAGNOSIS — R918 Other nonspecific abnormal finding of lung field: Secondary | ICD-10-CM | POA: Diagnosis not present

## 2017-06-16 DIAGNOSIS — J449 Chronic obstructive pulmonary disease, unspecified: Secondary | ICD-10-CM | POA: Diagnosis not present

## 2017-06-16 DIAGNOSIS — Z9049 Acquired absence of other specified parts of digestive tract: Secondary | ICD-10-CM | POA: Insufficient documentation

## 2017-06-16 DIAGNOSIS — I2510 Atherosclerotic heart disease of native coronary artery without angina pectoris: Secondary | ICD-10-CM | POA: Insufficient documentation

## 2017-06-16 DIAGNOSIS — R002 Palpitations: Secondary | ICD-10-CM | POA: Diagnosis not present

## 2017-06-16 DIAGNOSIS — I4819 Other persistent atrial fibrillation: Secondary | ICD-10-CM | POA: Diagnosis present

## 2017-06-16 DIAGNOSIS — R0789 Other chest pain: Secondary | ICD-10-CM | POA: Insufficient documentation

## 2017-06-16 DIAGNOSIS — I471 Supraventricular tachycardia: Principal | ICD-10-CM | POA: Insufficient documentation

## 2017-06-16 DIAGNOSIS — I495 Sick sinus syndrome: Secondary | ICD-10-CM | POA: Diagnosis not present

## 2017-06-16 DIAGNOSIS — I700 Atherosclerosis of aorta: Secondary | ICD-10-CM | POA: Insufficient documentation

## 2017-06-16 DIAGNOSIS — I480 Paroxysmal atrial fibrillation: Secondary | ICD-10-CM | POA: Diagnosis present

## 2017-06-16 DIAGNOSIS — I5032 Chronic diastolic (congestive) heart failure: Secondary | ICD-10-CM | POA: Diagnosis not present

## 2017-06-16 DIAGNOSIS — I739 Peripheral vascular disease, unspecified: Secondary | ICD-10-CM | POA: Diagnosis not present

## 2017-06-16 DIAGNOSIS — R61 Generalized hyperhidrosis: Secondary | ICD-10-CM | POA: Insufficient documentation

## 2017-06-16 DIAGNOSIS — Z96651 Presence of right artificial knee joint: Secondary | ICD-10-CM | POA: Diagnosis not present

## 2017-06-16 DIAGNOSIS — H409 Unspecified glaucoma: Secondary | ICD-10-CM | POA: Diagnosis not present

## 2017-06-16 DIAGNOSIS — R079 Chest pain, unspecified: Secondary | ICD-10-CM | POA: Diagnosis present

## 2017-06-16 DIAGNOSIS — Z79899 Other long term (current) drug therapy: Secondary | ICD-10-CM | POA: Diagnosis not present

## 2017-06-16 DIAGNOSIS — K219 Gastro-esophageal reflux disease without esophagitis: Secondary | ICD-10-CM | POA: Insufficient documentation

## 2017-06-16 DIAGNOSIS — I10 Essential (primary) hypertension: Secondary | ICD-10-CM | POA: Diagnosis present

## 2017-06-16 HISTORY — DX: Heart failure, unspecified: I50.9

## 2017-06-16 LAB — CBC
HEMATOCRIT: 32.4 % — AB (ref 36.0–46.0)
Hemoglobin: 10.5 g/dL — ABNORMAL LOW (ref 12.0–15.0)
MCH: 25.7 pg — ABNORMAL LOW (ref 26.0–34.0)
MCHC: 32.4 g/dL (ref 30.0–36.0)
MCV: 79.2 fL (ref 78.0–100.0)
Platelets: 198 10*3/uL (ref 150–400)
RBC: 4.09 MIL/uL (ref 3.87–5.11)
RDW: 16.5 % — AB (ref 11.5–15.5)
WBC: 5.3 10*3/uL (ref 4.0–10.5)

## 2017-06-16 LAB — BASIC METABOLIC PANEL
Anion gap: 5 (ref 5–15)
BUN: 20 mg/dL (ref 6–20)
CO2: 25 mmol/L (ref 22–32)
Calcium: 8.9 mg/dL (ref 8.9–10.3)
Chloride: 101 mmol/L (ref 101–111)
Creatinine, Ser: 1.02 mg/dL — ABNORMAL HIGH (ref 0.44–1.00)
GFR calc Af Amer: 58 mL/min — ABNORMAL LOW (ref >60)
GFR, EST NON AFRICAN AMERICAN: 50 mL/min — AB (ref >60)
GLUCOSE: 164 mg/dL — AB (ref 65–99)
Potassium: 3.8 mmol/L (ref 3.5–5.1)
Sodium: 131 mmol/L — ABNORMAL LOW (ref 135–145)

## 2017-06-16 LAB — I-STAT TROPONIN, ED: Troponin i, poc: 0.01 ng/mL (ref 0.00–0.08)

## 2017-06-16 MED ORDER — NITROGLYCERIN 0.4 MG SL SUBL: 0.4000 mg | SUBLINGUAL_TABLET | SUBLINGUAL | Status: DC | PRN

## 2017-06-16 MED ORDER — HEPARIN BOLUS VIA INFUSION
3000.0000 [IU] | Freq: Once | INTRAVENOUS | Status: AC
  Administered 2017-06-17: 3000 [IU] via INTRAVENOUS
  Filled 2017-06-16: qty 3000

## 2017-06-16 MED ORDER — HEPARIN (PORCINE) IN NACL 100-0.45 UNIT/ML-% IJ SOLN
1000.0000 [IU]/h | INTRAMUSCULAR | Status: DC
  Administered 2017-06-17: 1000 [IU]/h via INTRAVENOUS
  Filled 2017-06-16: qty 250

## 2017-06-16 MED ORDER — SODIUM CHLORIDE 0.9 % IV SOLN
INTRAVENOUS | Status: DC
  Administered 2017-06-17: via INTRAVENOUS

## 2017-06-16 MED ORDER — ACETAMINOPHEN 325 MG PO TABS: 650.0000 mg | ORAL_TABLET | ORAL | Status: DC | PRN

## 2017-06-16 MED ORDER — BRIMONIDINE TARTRATE 0.2 % OP SOLN
1.0000 [drp] | Freq: Every day | OPHTHALMIC | Status: DC
  Administered 2017-06-17: 1 [drp] via OPHTHALMIC
  Filled 2017-06-16: qty 5

## 2017-06-16 MED ORDER — GI COCKTAIL ~~LOC~~
30.0000 mL | Freq: Once | ORAL | Status: AC
  Administered 2017-06-16: 30 mL via ORAL
  Filled 2017-06-16: qty 30

## 2017-06-16 MED ORDER — ATORVASTATIN CALCIUM 80 MG PO TABS
80.0000 mg | ORAL_TABLET | Freq: Every day | ORAL | Status: DC
  Administered 2017-06-17: 80 mg via ORAL
  Filled 2017-06-16: qty 1

## 2017-06-16 MED ORDER — CITALOPRAM HYDROBROMIDE 20 MG PO TABS
20.0000 mg | ORAL_TABLET | Freq: Every day | ORAL | Status: DC
  Administered 2017-06-17: 20 mg via ORAL
  Filled 2017-06-16: qty 1

## 2017-06-16 MED ORDER — SODIUM CHLORIDE 0.9 % IV BOLUS (SEPSIS)
1000.0000 mL | Freq: Once | INTRAVENOUS | Status: AC
  Administered 2017-06-16: 1000 mL via INTRAVENOUS

## 2017-06-16 MED ORDER — ASPIRIN EC 81 MG PO TBEC: 81.0000 mg | DELAYED_RELEASE_TABLET | Freq: Every day | ORAL | Status: DC

## 2017-06-16 MED ORDER — VITAMIN D 1000 UNITS PO TABS
1000.0000 [IU] | ORAL_TABLET | Freq: Every day | ORAL | Status: DC
  Administered 2017-06-17: 1000 [IU] via ORAL
  Filled 2017-06-16: qty 1

## 2017-06-16 MED ORDER — LISINOPRIL 40 MG PO TABS
40.0000 mg | ORAL_TABLET | Freq: Every day | ORAL | Status: DC
  Administered 2017-06-17: 40 mg via ORAL
  Filled 2017-06-16: qty 1

## 2017-06-16 MED ORDER — METOPROLOL TARTRATE 12.5 MG HALF TABLET
12.5000 mg | ORAL_TABLET | Freq: Two times a day (BID) | ORAL | Status: DC
  Administered 2017-06-17 (×2): 12.5 mg via ORAL
  Filled 2017-06-16 (×2): qty 1

## 2017-06-16 MED ORDER — ASPIRIN EC 81 MG PO TBEC
81.0000 mg | DELAYED_RELEASE_TABLET | Freq: Every day | ORAL | Status: DC
  Administered 2017-06-17: 81 mg via ORAL
  Filled 2017-06-16 (×2): qty 1

## 2017-06-16 MED ORDER — LEVOTHYROXINE SODIUM 112 MCG PO TABS
112.0000 ug | ORAL_TABLET | Freq: Every day | ORAL | Status: DC
  Administered 2017-06-17: 112 ug via ORAL
  Filled 2017-06-16: qty 1

## 2017-06-16 MED ORDER — CALCIUM CARBONATE-VITAMIN D 500-200 MG-UNIT PO TABS
1.0000 | ORAL_TABLET | Freq: Every day | ORAL | Status: DC
  Administered 2017-06-17: 1 via ORAL
  Filled 2017-06-16: qty 1

## 2017-06-16 MED ORDER — CILOSTAZOL 100 MG PO TABS
100.0000 mg | ORAL_TABLET | Freq: Two times a day (BID) | ORAL | Status: DC
  Administered 2017-06-17 (×2): 100 mg via ORAL
  Filled 2017-06-16 (×3): qty 1

## 2017-06-16 MED ORDER — POTASSIUM CHLORIDE CRYS ER 10 MEQ PO TBCR
10.0000 meq | EXTENDED_RELEASE_TABLET | Freq: Every day | ORAL | Status: DC
  Administered 2017-06-17: 10 meq via ORAL
  Filled 2017-06-16: qty 1

## 2017-06-16 MED ORDER — ONDANSETRON HCL 4 MG/2ML IJ SOLN: 4.0000 mg | Freq: Four times a day (QID) | INTRAMUSCULAR | Status: DC | PRN

## 2017-06-16 MED ORDER — DORZOLAMIDE HCL-TIMOLOL MAL 2-0.5 % OP SOLN
1.0000 [drp] | Freq: Two times a day (BID) | OPHTHALMIC | Status: DC
  Administered 2017-06-17 (×2): 1 [drp] via OPHTHALMIC
  Filled 2017-06-16: qty 10

## 2017-06-16 MED ORDER — ATROPINE SULFATE 1 % OP SOLN
1.0000 [drp] | Freq: Every day | OPHTHALMIC | Status: DC
  Administered 2017-06-17: 1 [drp] via OPHTHALMIC
  Filled 2017-06-16: qty 2

## 2017-06-16 MED ORDER — LATANOPROST 0.005 % OP SOLN
1.0000 [drp] | Freq: Every day | OPHTHALMIC | Status: DC
  Administered 2017-06-17: 1 [drp] via OPHTHALMIC
  Filled 2017-06-16: qty 2.5

## 2017-06-16 MED ORDER — HYDRALAZINE HCL 50 MG PO TABS
50.0000 mg | ORAL_TABLET | Freq: Three times a day (TID) | ORAL | Status: DC
  Administered 2017-06-17 (×2): 50 mg via ORAL
  Filled 2017-06-16 (×2): qty 1

## 2017-06-16 NOTE — ED Notes (Signed)
ED Provider at bedside. 

## 2017-06-16 NOTE — H&P (Signed)
Cardiology Admission History and Physical:   Patient ID: Veronica Young; [id number]; 02-Feb-1933   Admission date: 06/16/2017  Primary Care Provider: Katherina Mires, MD Primary Cardiologist: Dr. [name] Primary Electrophysiologist:  Dr. Lovena Le PV: Dr. Gwenlyn Found    Patient Profile:   Veronica Young is a 83 y.o. female with a history of PAD, SN dysfunction with PPM placed 6378, chronic diastolic HF, DM-2, HTN, HLD and deafness now presents with chest pain.     History of Present Illness:   [name] has a hx of PAD (known bilateral popliteal occlusions with conservative management favored), sinus node dysfunction (s/p PPM placement 07/2014), chronic diastolic CHF, Type 2 DM, HTN, HLD, and deafness.  Recently seen by Dr. Adora Fridge for PAD and pletal increased for increased claudication and right ABI of 0.46 and a left upper thigh.  He will see her back in 3 months.   She had cath in 2005 with non obstructive disease and normal EF.   Last nuc 2010 with no ischemia and normal LV function and last Echo 2015 with EF 65-70% G2DD, moderate Pulmonary HTN and mild to mod TR.    Last pm she had rapid HR and it slowed.  Today she was fine until after lunch she had pudding and developed chest burning then her heart started racing.  She was nauseated and SOB a little diaphoretic. There is an interpreter in the room.  She took NTG with relief and she took 3 baby ASA.  She called EMS.  Currently feels well and is eating.  She has MDT PPM and has been interrogated.  She had atrial tachycardia 50 min of it around 1:30 pm at rate of 182.  And 6 min of it about 3:30 pm.      Currently in ST at 103.    On telemetry  EKG I personally reviewed with ST and PVC borderline st abnormalities in lat leads. Na is 131, K+ 3.8, Cr 1.02  Troponin 0.01 Hgb 10.5 Hct 32.4 Wbc 5.3.   EKG with no pulmonary edema.  Her claudication has improved with pletal.      Past Medical History:  Diagnosis Date  . Carotid bruit   .  Deafness   . Diabetes mellitus    type 2  . Diastolic heart failure (HCC)    EF is 60%  . GERD (gastroesophageal reflux disease)   . Glaucoma   . Hypercholesterolemia   . Hypertension   . Hypothyroidism   . Normal cardiac stress test January 2014  . PAD (peripheral artery disease) ([place])    a. known bilateral popliteal occlusions with conservative management favored.  [name] PAT (paroxysmal atrial tachycardia) ([place])   . Sick sinus syndrome [facility])    a. s/p PPM placement 07/2014    Past Surgical History:  Procedure Laterality Date  . cholycystectomy    . left breast cyst removed    . PACEMAKER INSERTION  07-26-2014   MDT ADDRL1 pacemaker implanted by Dr Lovena Le for SSS  . PERMANENT PACEMAKER INSERTION N/A 07/26/2014   Procedure: PERMANENT PACEMAKER INSERTION;  Surgeon: Evans Lance, MD;  Location: [facility] CATH LAB;  Service: Cardiovascular;  Laterality: N/A;  . TOTAL KNEE ARTHROPLASTY     right     Medications Prior to Admission: Prior to Admission medications   Medication Sig Start Date End Date Taking? Authorizing Provider  aspirin EC 81 MG tablet Take 81 mg by mouth at bedtime.    Yes [provider]  atorvastatin (LIPITOR) 80 MG tablet Take 80 mg by mouth at bedtime.    Yes [provider]  atropine 1 % ophthalmic solution Place 1 drop into the left eye at bedtime.    Yes [provider]  brimonidine (ALPHAGAN) 0.2 % ophthalmic solution Place 1 drop into both eyes daily. 05/20/17  Yes [provider]  calcium-vitamin D (OSCAL WITH D) 500-200 MG-UNIT per tablet Take 1 tablet by mouth daily with breakfast.   Yes [provider]  cholecalciferol (VITAMIN D) 1000 UNITS tablet Take 1,000 Units by mouth daily.   Yes [provider]  cilostazol (PLETAL) 100 MG tablet Take 1 tablet (100 mg total) by mouth 2 (two) times daily. 06/13/17  Yes Lorretta Harp, MD  citalopram (CELEXA) 20 MG tablet Take 20 mg by mouth at bedtime.  02/08/11  Yes  [provider]  dorzolamide-timolol (COSOPT) 22.3-6.8 MG/ML ophthalmic solution Place 1 drop into both eyes 2 (two) times daily.    Yes [provider]  hydrALAZINE (APRESOLINE) 50 MG tablet Take 50 mg by mouth 3 (three) times daily. 06/09/17  Yes [provider]  latanoprost (XALATAN) 0.005 % ophthalmic solution Place 1 drop into both eyes daily. 11/25/15  Yes [provider]  levothyroxine (SYNTHROID, LEVOTHROID) 112 MCG tablet Take 112 mcg by mouth daily before breakfast.  03/31/14  Yes [provider]  lisinopril (PRINIVIL,ZESTRIL) 40 MG tablet Take 1 tablet (40 mg total) by mouth daily. 04/25/17  Yes [name], Peter M, MD  potassium chloride (K-DUR,KLOR-CON) 10 MEQ tablet Take 10 mEq by mouth daily. 11/13/15  Yes [provider]  hydrALAZINE (APRESOLINE) 25 MG tablet Take 1.5 tablets (37.5 mg total) by mouth 3 (three) times daily. Patient not taking: Reported on 06/16/2017 02/28/17   [name], Peter M, MD   is taking hydralazine the 50 mg TID.    Allergies:    Allergies  Allergen Reactions  . Amlodipine Nausea Only       . Benicar [Olmesartan Medoxomil] Nausea Only    Social History:   Social History   Social History  . Marital status: Widowed    Spouse name: N/A  . Number of children: 4  . Years of education: N/A   Occupational History  . Not on file.   Social History Main Topics  . Smoking status: Never Smoker  . Smokeless tobacco: Never Used  . Alcohol use No  . Drug use: No  . Sexual activity: No   Other Topics Concern  . Not on file   Social History Narrative  . No narrative on file    Family History:   The patient's family history includes Heart disease in her brother, mother, and sister; Liver cancer in her father; Stroke in her sister.    ROS:  Please see the history of present illness.  General:no colds or fevers, no weight changes Skin:no rashes + ulcers on back of her leg, or tear but leaking fluid.- serous  fluid. HEENT:no blurred vision, no congestion CV:see HPI PUL:see HPI GI:no diarrhea constipation or melena, no indigestion GU:no hematuria, no dysuria MS:no joint pain, no claudication Neuro:no syncope, no lightheadedness Endo:no diabetes, no thyroid disease      Physical Exam/Data:   Vitals:   06/16/17 1945 06/16/17 2000 06/16/17 2015 06/16/17 2016  BP: (!) 188/51 117/80 (!) 156/60   Pulse: 99 (!) 106 95   Resp: 17 (!) 26    Temp:      TempSrc:  SpO2: 97% 95%  96%    Intake/Output Summary (Last 24 hours) at 06/16/17 2024 Last data filed at 06/16/17 1840  Gross per 24 hour  Intake              999 ml  Output                0 ml  Net              999 ml     General:  Well nourished, well developed, in no acute distress HEENT: normal Lymph: no adenopathy Neck: no JVD Endocrine:  No thryomegaly Vascular: No carotid bruits; did not palpate pedal pulses with her known PAD Cardiac:  normal S1, S2; RRR; soft systolic 3-5/5 murmur no gallup rub or click. Lungs:  Breath sounds to auscultation bilaterally, no wheezing, + rhonchi or rales  Abd: soft, nontender, no hepatomegaly  Ext: 1+ edema of lower ext Musculoskeletal:  No deformities, Skin: warm and dry  Neuro:  A&O X 3 MAE follows commands with interpreter with sign language.   Psych:  Normal affect    Relevant CV Studies: Reviewed Results:  Echocardiogram: 07/26/2014 Study Conclusions  - Left ventricle: The cavity size was normal. There was mild concentric hypertrophy. Systolic function was vigorous. The estimated ejection fraction was in the range of 65% to 70%. Wall motion was normal; there were no regional wall motion abnormalities. Features are consistent with a pseudonormal left ventricular filling pattern, with concomitant abnormal relaxation and increased filling pressure (grade 2 diastolic dysfunction). There was no evidence of elevated ventricular filling pressure by Doppler  parameters. - Aortic valve: There was no regurgitation. - Aortic root: The aortic root was normal in size. - Mitral valve: There was mild regurgitation. - Left atrium: The atrium was mildly dilated. - Tricuspid valve: There was mild regurgitation. - Pulmonic valve: There was no regurgitation. - Pulmonary arteries: Systolic pressure was moderately increased. PA peak pressure: 50 mm Hg (S). - Pericardium, extracardiac: There was no pericardial effusion.  Impressions:  - Normal biventricular size and function. Grade 2 diastolic dysfunction with normal filling pressures. Moderate pulmonary hypertension. Mild mitral and tricuspid regurgitation.   Laboratory Data:  Chemistry  Recent Labs Lab 06/16/17 1427  NA 131*  K 3.8  CL 101  CO2 25  GLUCOSE 164*  BUN 20  CREATININE 1.02*  CALCIUM 8.9  GFRNONAA 50*  GFRAA 58*  ANIONGAP 5    No results for input(s): PROT, ALBUMIN, AST, ALT, ALKPHOS, BILITOT in the last 168 hours. Hematology  Recent Labs Lab 06/16/17 1427  WBC 5.3  RBC 4.09  HGB 10.5*  HCT 32.4*  MCV 79.2  MCH 25.7*  MCHC 32.4  RDW 16.5*  PLT 198   Cardiac EnzymesNo results for input(s): TROPONINI in the last 168 hours.   Recent Labs Lab 06/16/17 1436  TROPIPOC 0.01    BNPNo results for input(s): BNP, PROBNP in the last 168 hours.  DDimer No results for input(s): DDIMER in the last 168 hours.  Radiology/Studies:  Dg Chest 2 View  Result Date: 06/16/2017 CLINICAL DATA:  Acute onset of left-sided chest pain which began earlier today. Indwelling pacemaker. Current history of hypertension, diabetes, diastolic congestive heart failure and hypothyroidism. EXAM: CHEST  2 VIEW COMPARISON:  09/15/2014, 07/27/2014 and earlier. FINDINGS: Cardiac silhouette mildly to moderately enlarged, unchanged. Left subclavian dual lead transvenous pacemaker unchanged and appears intact. Thoracic aorta mildly tortuous and atherosclerotic, unchanged. Hilar and  mediastinal contours otherwise unremarkable. Prominent bronchovascular markings  diffusely and mild central peribronchial thickening, unchanged, with associated hyperinflation. Linear atelectasis involving the lingula, best seen on the lateral image. Lungs otherwise clear. No localized airspace consolidation. No pleural effusions. No pneumothorax. Normal pulmonary vascularity. Visualized bony thorax intact. Degenerative changes involving the upper lumbar spine. Thoracic levoscoliosis and thoracolumbar dextroscoliosis. IMPRESSION: 1. Linear atelectasis involving the lingula. No acute cardiopulmonary disease otherwise. 2. Stable mild cardiomegaly without pulmonary edema. 3. Stable COPD (chronic bronchitis). 4. Thoracic aortic atherosclerosis. Electronically Signed   By: Evangeline Dakin M.D.   On: 06/16/2017 15:17    Assessment and Plan:   1. PAF- 50 min at longest - will add BB and IV heparin monitoring BP, will admit and monitor, check serial troponin with chest pain. Will check TSH   2. Chest pain with hx of CAD,  And with chest pain will check serial troponins.  Plan on testing depending on troponin  3. Claudication improved with pletal.  4. Chronic diastolic HF. With lower ext edema will check echo.  5. Leg wound- will have wound care see tomorrow.      Signed, Cecilie Kicks, NP  06/16/2017 8:24 PM   Patient seen and examined.  History as above.  I reviewed the pacer strips and she had 50 minutes of an atrialtach at a rate of 182 around 1:30.  And 6 minutes around 3 pm.  Currently feels OK.  Has prior nonsustained atrial tachycardia.   1.  Atrial tachycardia prolonged.  2. CAD as above 3. Permanent pacer  Rec:  Agree with r/o and add beta blocker to suppress atrial tach.  ? is whether will need to be anticoagulated.  Kerry Hough MD [facility] 8:46 PM

## 2017-06-16 NOTE — ED Provider Notes (Signed)
[facility] DEPT Provider Note   CSN: [id number] Arrival date & time: 06/16/17  1407     History   Chief Complaint Chief Complaint  Patient presents with  . Chest Pain    HPI Veronica Young is a 83 y.o. female.  HPI   83 year old female presents today with complaints of fatigue.  Patient notes that last night she felt like her heart was racing and had a burning sensation in her neck.  She notes some pain over the left chest wall over her pacemaker.  She notes this was associated with some shortness of breath, denied any cough or fever.  Patient woke up this morning without any significant discomfort, but noticed symptoms came back after eating lunch today.  She notes she was fatigued, racing heart, with burning in her throat.  EMS was called who gave aspirin, nitro which improved her symptoms.  Patient also reports some epigastric discomfort that feels like acid reflux.  Patient notes she has had these sensations in the past, noting this was similar to her presentation at the time of pacemaker insertion.  She notes that prior to her insertion of pacemaker her blood pressure was wildly uncontrolled, she notes it still is slightly elevated from time to time most recently she had a systolic reading in the 517G at her primary care provider's office last week, but went down after subsequent readings.  Patient also notes incidentally she struck her leg on an elevator causing it small wound to the right posterior leg.    Past Medical History:  Diagnosis Date  . Carotid bruit   . Deafness   . Diabetes mellitus    type 2  . Diastolic heart failure (HCC)    EF is 60%  . GERD (gastroesophageal reflux disease)   . Glaucoma   . Hypercholesterolemia   . Hypertension   . Hypothyroidism   . Normal cardiac stress test January 2014  . PAD (peripheral artery disease) ([place])    a. known bilateral popliteal occlusions with conservative management favored.  [name] PAT (paroxysmal atrial tachycardia)  ([place])   . Sick sinus syndrome [facility])    a. s/p PPM placement 07/2014    Patient Active Problem List   Diagnosis Date Noted  . Sinus node dysfunction ([place]) 09/10/2016  . Cardiac pacemaker in situ 11/08/2014  . HTN (hypertension) 09/22/2014  . Hypertensive urgency 09/15/2014  . Chronic diastolic CHF (congestive heart failure) ([place]) 11/27/2012  . PAD (peripheral artery disease) ([place]) 03/18/2012  . Claudication ([place]) 02/19/2012  . Hypercholesterolemia   . Deafness   . Carotid bruit   . GERD (gastroesophageal reflux disease)   . Hypothyroidism     Past Surgical History:  Procedure Laterality Date  . cholycystectomy    . left breast cyst removed    . PACEMAKER INSERTION  07-26-2014   MDT ADDRL1 pacemaker implanted by Dr Lovena Le for SSS  . PERMANENT PACEMAKER INSERTION N/A 07/26/2014   Procedure: PERMANENT PACEMAKER INSERTION;  Surgeon: Evans Lance, MD;  Location: [facility] CATH LAB;  Service: Cardiovascular;  Laterality: N/A;  . TOTAL KNEE ARTHROPLASTY     right    OB History    No data available       Home Medications    Prior to Admission medications   Medication Sig Start Date End Date Taking? Authorizing Provider  aspirin EC 81 MG tablet Take 81 mg by mouth at bedtime.    Yes [provider]  atorvastatin (LIPITOR) 80 MG tablet Take 80  mg by mouth at bedtime.    Yes [provider]  atropine 1 % ophthalmic solution Place 1 drop into the left eye at bedtime.    Yes [provider]  brimonidine (ALPHAGAN) 0.2 % ophthalmic solution Place 1 drop into both eyes daily. 05/20/17  Yes [provider]  calcium-vitamin D (OSCAL WITH D) 500-200 MG-UNIT per tablet Take 1 tablet by mouth daily with breakfast.   Yes [provider]  cholecalciferol (VITAMIN D) 1000 UNITS tablet Take 1,000 Units by mouth daily.   Yes [provider]  cilostazol (PLETAL) 100 MG tablet Take 1 tablet (100 mg total) by mouth 2 (two) times daily. 06/13/17  Yes  Lorretta Harp, MD  citalopram (CELEXA) 20 MG tablet Take 20 mg by mouth at bedtime.  02/08/11  Yes [provider]  dorzolamide-timolol (COSOPT) 22.3-6.8 MG/ML ophthalmic solution Place 1 drop into both eyes 2 (two) times daily.    Yes [provider]  hydrALAZINE (APRESOLINE) 50 MG tablet Take 50 mg by mouth 3 (three) times daily. 06/09/17  Yes [provider]  latanoprost (XALATAN) 0.005 % ophthalmic solution Place 1 drop into both eyes daily. 11/25/15  Yes [provider]  levothyroxine (SYNTHROID, LEVOTHROID) 112 MCG tablet Take 112 mcg by mouth daily before breakfast.  03/31/14  Yes [provider]  lisinopril (PRINIVIL,ZESTRIL) 40 MG tablet Take 1 tablet (40 mg total) by mouth daily. 04/25/17  Yes [name], Peter M, MD  potassium chloride (K-DUR,KLOR-CON) 10 MEQ tablet Take 10 mEq by mouth daily. 11/13/15  Yes [provider]  hydrALAZINE (APRESOLINE) 25 MG tablet Take 1.5 tablets (37.5 mg total) by mouth 3 (three) times daily. Patient not taking: Reported on 06/16/2017 02/28/17   [name], Peter M, MD    Family History Family History  Problem Relation Age of Onset  . Heart disease Mother   . Liver cancer Father   . Heart disease Sister   . Heart disease Brother   . Stroke Sister     Social History Social History  Substance Use Topics  . Smoking status: Never Smoker  . Smokeless tobacco: Never Used  . Alcohol use No     Allergies   Amlodipine and Benicar [olmesartan medoxomil]   Review of Systems Review of Systems  All other systems reviewed and are negative.    Physical Exam Updated Vital Signs BP (!) 156/60   Pulse 95   Temp 97.7 F (36.5 C) (Oral)   Resp (!) 26   SpO2 96%   Physical Exam  Constitutional: She is oriented to person, place, and time. She appears well-developed and well-nourished.  HENT:  Head: Normocephalic and atraumatic.  Eyes: Conjunctivae are normal. Pupils are equal, round, and reactive to  light. Right eye exhibits no discharge. Left eye exhibits no discharge. No scleral icterus.  Neck: Normal range of motion. No JVD present. No tracheal deviation present.  Cardiovascular: Regular rhythm and normal heart sounds.   Pulmonary/Chest: Effort normal. No stridor.  Abdominal: Soft. She exhibits no distension and no mass. There is no tenderness. There is no rebound and no guarding. No hernia.  Musculoskeletal: Normal range of motion. She exhibits no edema.  Neurological: She is alert and oriented to person, place, and time. Coordination normal.  Skin: Skin is warm.  Small .5cm laceration to posterior left lower extremity- no surrounding redness  Psychiatric: She has a normal mood and affect. Her behavior is normal. Judgment and thought content normal.  Nursing note and vitals  reviewed.    ED Treatments / Results  Labs (all labs ordered are listed, but only abnormal results are displayed) Labs Reviewed  BASIC METABOLIC PANEL - Abnormal; Notable for the following:       Result Value   Sodium 131 (*)    Glucose, Bld 164 (*)    Creatinine, Ser 1.02 (*)    GFR calc non Af Amer 50 (*)    GFR calc Af Amer 58 (*)    All other components within normal limits  CBC - Abnormal; Notable for the following:    Hemoglobin 10.5 (*)    HCT 32.4 (*)    MCH 25.7 (*)    RDW 16.5 (*)    All other components within normal limits  TROPONIN I  I-STAT TROPOININ, ED    EKG  EKG Interpretation  Date/Time:  Monday June 16 2017 14:26:38 EDT Ventricular Rate:  114 PR Interval:    QRS Duration: 101 QT Interval:  337 QTC Calculation: 465 R Axis:   37 Text Interpretation:  Sinus tachycardia Multiple premature complexes, vent & supraven Borderline T abnormalities, lateral leads no change from previos Confirmed by Charlesetta Shanks [phone]) on 06/16/2017 3:27:51 PM       Radiology Dg Chest 2 View  Result Date: 06/16/2017 CLINICAL DATA:  Acute onset of left-sided chest pain which began earlier  today. Indwelling pacemaker. Current history of hypertension, diabetes, diastolic congestive heart failure and hypothyroidism. EXAM: CHEST  2 VIEW COMPARISON:  09/15/2014, 07/27/2014 and earlier. FINDINGS: Cardiac silhouette mildly to moderately enlarged, unchanged. Left subclavian dual lead transvenous pacemaker unchanged and appears intact. Thoracic aorta mildly tortuous and atherosclerotic, unchanged. Hilar and mediastinal contours otherwise unremarkable. Prominent bronchovascular markings diffusely and mild central peribronchial thickening, unchanged, with associated hyperinflation. Linear atelectasis involving the lingula, best seen on the lateral image. Lungs otherwise clear. No localized airspace consolidation. No pleural effusions. No pneumothorax. Normal pulmonary vascularity. Visualized bony thorax intact. Degenerative changes involving the upper lumbar spine. Thoracic levoscoliosis and thoracolumbar dextroscoliosis. IMPRESSION: 1. Linear atelectasis involving the lingula. No acute cardiopulmonary disease otherwise. 2. Stable mild cardiomegaly without pulmonary edema. 3. Stable COPD (chronic bronchitis). 4. Thoracic aortic atherosclerosis. Electronically Signed   By: Evangeline Dakin M.D.   On: 06/16/2017 15:17    Procedures Procedures (including critical care time)  Medications Ordered in ED Medications  sodium chloride 0.9 % bolus 1,000 mL (0 mLs Intravenous Stopped 06/16/17 1840)  gi cocktail (Maalox,Lidocaine,Donnatal) (30 mLs Oral Given 06/16/17 1723)     Initial Impression / Assessment and Plan / ED Course  I have reviewed the triage vital signs and the nursing notes.  Pertinent labs & imaging results that were available during my care of the patient were reviewed by me and considered in my medical decision making (see chart for details).     Final Clinical Impressions(s) / ED Diagnoses   Final diagnoses:  Chest pain, unspecified type    Labs: CBC, BMP, I stat-trop  Imaging:  DG Chest 2 view  Consults:  Therapeutics: GI cocktail, normal saline  Discharge Meds:   Assessment/Plan: 83 year old female presents today with chest pain.  Patient's presentation concerning for chest pain versus indigestion.  Patient having significant tachycardia and hypertension here in the ED.  After receiving her transmissions from her pacemaker she has had runs of atrial tachycardia in the 160s-180s that have sustained up to 45 minutes.  Patient also having rapid ventricular rhythm as well.  Concerned that this is contributing to her symptoms.  Cardiology  consulted to evaluate the patient here in the ED for hospital admission.     New Prescriptions New Prescriptions   No medications on file     Francee Gentile 06/16/17 2026    Charlesetta Shanks, MD 06/27/17 1450

## 2017-06-16 NOTE — ED Notes (Signed)
Language interpreter arrived to room

## 2017-06-16 NOTE — ED Notes (Signed)
Certified interpreter arrived at bedisde

## 2017-06-16 NOTE — ED Notes (Signed)
Got patient into a gown on the monitor did ekg shown to er doctor patient is resting with family at bedside

## 2017-06-16 NOTE — ED Notes (Signed)
Attempted report x1. 

## 2017-06-16 NOTE — ED Notes (Signed)
Provider at the bedside, given paper copy of the medtronic report by this nurse.

## 2017-06-16 NOTE — ED Notes (Signed)
Pt sign language interpreter leaving for the evening. This nurse left a message for the [employer] [facility] [employer]. Phone number and message left, will use iPad interpreter until further notified by [facility].

## 2017-06-16 NOTE — ED Triage Notes (Signed)
Pt in from home via [facility] c/o of substernal CP onset today @ 12 after eating, pt rcvd 324 mg ASA pta, pt rcvd x2 sL nitro, 4 mg ODT zofran pta, pt rates cp 1/10 upon arrival to ED, pt deaf and requires interpretor, pt A&O x4

## 2017-06-16 NOTE — Progress Notes (Signed)
ANTICOAGULATION CONSULT NOTE - Initial Consult  Pharmacy Consult for Heparin Indication: chest pain/ACS  Allergies  Allergen Reactions  . Amlodipine Nausea Only       . Benicar [Olmesartan Medoxomil] Nausea Only    Patient Measurements: Height: 5\' 6"  (167.6 cm) Weight: 150 lb 1.6 oz (68.1 kg) IBW/kg (Calculated) : 59.3  Vital Signs: Temp: 97.7 F (36.5 C) (06/25 1415) Temp Source: Oral (06/25 1415) BP: 153/53 (06/25 2245) Pulse Rate: 62 (06/25 2245)  Labs:  Recent Labs  06/16/17 1427  HGB 10.5*  HCT 32.4*  PLT 198  CREATININE 1.02*    Estimated Creatinine Clearance: 40.5 mL/min (A) (by C-G formula based on SCr of 1.02 mg/dL (H)).   Medical History: Past Medical History:  Diagnosis Date  . Carotid bruit   . Deafness   . Diabetes mellitus    type 2  . Diastolic heart failure (HCC)    EF is 60%  . GERD (gastroesophageal reflux disease)   . Glaucoma   . Hypercholesterolemia   . Hypertension   . Hypothyroidism   . Normal cardiac stress test January 2014  . PAD (peripheral artery disease) ([place])    a. known bilateral popliteal occlusions with conservative management favored.  [name] PAT (paroxysmal atrial tachycardia) ([place])   . Sick sinus syndrome [facility])    a. s/p PPM placement 07/2014    Medications:  Prescriptions Prior to Admission  Medication Sig Dispense Refill Last Dose  . aspirin EC 81 MG tablet Take 81 mg by mouth at bedtime.    06/15/2017 at pm  . atorvastatin (LIPITOR) 80 MG tablet Take 80 mg by mouth at bedtime.    06/15/2017 at pm  . atropine 1 % ophthalmic solution Place 1 drop into the left eye at bedtime.    06/15/2017 at pm  . brimonidine (ALPHAGAN) 0.2 % ophthalmic solution Place 1 drop into both eyes daily.   06/15/2017 at am  . calcium-vitamin D (OSCAL WITH D) 500-200 MG-UNIT per tablet Take 1 tablet by mouth daily with breakfast.   06/16/2017 at am  . cholecalciferol (VITAMIN D) 1000 UNITS tablet Take 1,000 Units by mouth daily.   06/16/2017 at am   . cilostazol (PLETAL) 100 MG tablet Take 1 tablet (100 mg total) by mouth 2 (two) times daily. 60 tablet 6 06/16/2017 at am  . citalopram (CELEXA) 20 MG tablet Take 20 mg by mouth at bedtime.    06/15/2017 at pm  . dorzolamide-timolol (COSOPT) 22.3-6.8 MG/ML ophthalmic solution Place 1 drop into both eyes 2 (two) times daily.    06/15/2017 at 900  . hydrALAZINE (APRESOLINE) 50 MG tablet Take 50 mg by mouth 3 (three) times daily.   06/16/2017 at am  . latanoprost (XALATAN) 0.005 % ophthalmic solution Place 1 drop into both eyes daily.  12 06/15/2017 at am  . levothyroxine (SYNTHROID, LEVOTHROID) 112 MCG tablet Take 112 mcg by mouth daily before breakfast.    06/16/2017 at am  . lisinopril (PRINIVIL,ZESTRIL) 40 MG tablet Take 1 tablet (40 mg total) by mouth daily. 90 tablet 1 06/16/2017 at am  . potassium chloride (K-DUR,KLOR-CON) 10 MEQ tablet Take 10 mEq by mouth daily.   06/16/2017 at am  . hydrALAZINE (APRESOLINE) 25 MG tablet Take 1.5 tablets (37.5 mg total) by mouth 3 (three) times daily. (Patient not taking: Reported on 06/16/2017) 120 tablet 11 Not Taking at Unknown time    Assessment: 83 y.o. female with chest pain and Afib for heparin Goal of Therapy:  Heparin level  0.3-0.7 units/ml Monitor platelets by anticoagulation protocol: Yes   Plan:  Heparin 3000 units IV bolus, then start heparin 1000 units/hr Check heparin level in 8 hours.    Caryl Pina 06/16/2017,11:30 PM

## 2017-06-16 NOTE — ED Notes (Addendum)
Pt daughter who is also deaf, left pager number (text only) in the event pt needs to be reached or pt is discharged:  Cecille Rubin- [phone]  Pt other daughter, [name], phone number below: [phone]

## 2017-06-16 NOTE — ED Notes (Signed)
New interpreter to arrive at 7 PM. Pt reports she would rather wait until the interpreter arrived rather than using the language device. Explained delay and plan of care to pt using written words and lip reading. Will re-explain plan when interpreter arrives!

## 2017-06-16 NOTE — ED Notes (Addendum)
This nurse called [facility] for interpreter to be sent tomorrow morning at 8 AM to ensure pt able to communicate for morning assessment and with provider rounding.

## 2017-06-16 NOTE — ED Notes (Signed)
Pt given [place] sandwich and crackers per [place], [place].

## 2017-06-16 NOTE — ED Notes (Signed)
Admitting at the bedside.  

## 2017-06-16 NOTE — ED Provider Notes (Signed)
Medical screening examination/treatment/procedure(s) were conducted as a shared visit with non-physician practitioner(s) and myself.  I personally evaluated the patient during the encounter.   EKG Interpretation  Date/Time:  Monday June 16 2017 14:26:38 EDT Ventricular Rate:  114 PR Interval:    QRS Duration: 101 QT Interval:  337 QTC Calculation: 465 R Axis:   37 Text Interpretation:  Sinus tachycardia Multiple premature complexes, vent & supraven Borderline T abnormalities, lateral leads no change from previos Confirmed by Charlesetta Shanks [phone]) on 06/16/2017 3:27:51 PM     Patient reports episodes through the night of burning quality in her chest as well as heart racing. She does not specify chest pain currently but does report improvement of symptoms with demonstration of aspirin nitroglycerin. Patient is alert and appropriate. At this time she has no respiratory distress. Heart is regular and lungs are grossly clear. Pacemaker interrogation indicates however patient has had sustained heart rates of the 160s to 180s. In conjunction with her age, symptoms which may represent anginal equivalent and high documented heart rates, plan will be for consult cardiology.   Charlesetta Shanks, MD 06/27/17 1450

## 2017-06-16 NOTE — ED Notes (Signed)
Per [facility], interpreter confirmed to be arriving tomorrow at 8 AM at 2W29 to interpret for pt.

## 2017-06-17 ENCOUNTER — Inpatient Hospital Stay (HOSPITAL_COMMUNITY): Payer: Medicare HMO

## 2017-06-17 DIAGNOSIS — I480 Paroxysmal atrial fibrillation: Secondary | ICD-10-CM

## 2017-06-17 DIAGNOSIS — R072 Precordial pain: Secondary | ICD-10-CM

## 2017-06-17 DIAGNOSIS — I110 Hypertensive heart disease with heart failure: Secondary | ICD-10-CM | POA: Diagnosis not present

## 2017-06-17 DIAGNOSIS — I2510 Atherosclerotic heart disease of native coronary artery without angina pectoris: Secondary | ICD-10-CM

## 2017-06-17 DIAGNOSIS — Z950 Presence of cardiac pacemaker: Secondary | ICD-10-CM

## 2017-06-17 DIAGNOSIS — I493 Ventricular premature depolarization: Secondary | ICD-10-CM | POA: Diagnosis not present

## 2017-06-17 DIAGNOSIS — I471 Supraventricular tachycardia: Secondary | ICD-10-CM

## 2017-06-17 DIAGNOSIS — I495 Sick sinus syndrome: Secondary | ICD-10-CM | POA: Diagnosis not present

## 2017-06-17 DIAGNOSIS — R079 Chest pain, unspecified: Secondary | ICD-10-CM

## 2017-06-17 LAB — ECHOCARDIOGRAM COMPLETE
Height: 66 in
WEIGHTICAEL: 2400 [oz_av]

## 2017-06-17 LAB — CBC
HEMATOCRIT: 28.1 % — AB (ref 36.0–46.0)
HEMOGLOBIN: 8.6 g/dL — AB (ref 12.0–15.0)
MCH: 24.8 pg — ABNORMAL LOW (ref 26.0–34.0)
MCHC: 30.6 g/dL (ref 30.0–36.0)
MCV: 81 fL (ref 78.0–100.0)
Platelets: 166 10*3/uL (ref 150–400)
RBC: 3.47 MIL/uL — AB (ref 3.87–5.11)
RDW: 17 % — ABNORMAL HIGH (ref 11.5–15.5)
WBC: 5 10*3/uL (ref 4.0–10.5)

## 2017-06-17 LAB — MAGNESIUM: MAGNESIUM: 1.9 mg/dL (ref 1.7–2.4)

## 2017-06-17 LAB — PROTIME-INR
INR: 0.97
Prothrombin Time: 12.9 seconds (ref 11.4–15.2)

## 2017-06-17 LAB — HEPATIC FUNCTION PANEL
ALT: 14 U/L (ref 14–54)
AST: 19 U/L (ref 15–41)
Albumin: 3.2 g/dL — ABNORMAL LOW (ref 3.5–5.0)
Alkaline Phosphatase: 76 U/L (ref 38–126)
Bilirubin, Direct: <0.1 mg/dL — ABNORMAL LOW (ref 0.1–0.5)
TOTAL PROTEIN: 6.2 g/dL — AB (ref 6.5–8.1)
Total Bilirubin: 0.4 mg/dL (ref 0.3–1.2)

## 2017-06-17 LAB — LIPID PANEL
Cholesterol: 179 mg/dL (ref 0–200)
HDL: 45 mg/dL (ref >40)
LDL CALC: 127 mg/dL — AB (ref 0–99)
Total CHOL/HDL Ratio: 4 RATIO
Triglycerides: 33 mg/dL (ref <150)
VLDL: 7 mg/dL (ref 0–40)

## 2017-06-17 LAB — T4, FREE: FREE T4: 0.96 ng/dL (ref 0.61–1.12)

## 2017-06-17 LAB — HEPARIN LEVEL (UNFRACTIONATED): Heparin Unfractionated: 0.35 IU/mL (ref 0.30–0.70)

## 2017-06-17 LAB — HEMOGLOBIN AND HEMATOCRIT, BLOOD
HCT: 29.2 % — ABNORMAL LOW (ref 36.0–46.0)
HEMOGLOBIN: 9 g/dL — AB (ref 12.0–15.0)

## 2017-06-17 LAB — BASIC METABOLIC PANEL
ANION GAP: 7 (ref 5–15)
BUN: 18 mg/dL (ref 6–20)
CO2: 21 mmol/L — ABNORMAL LOW (ref 22–32)
Calcium: 8.1 mg/dL — ABNORMAL LOW (ref 8.9–10.3)
Chloride: 106 mmol/L (ref 101–111)
Creatinine, Ser: 1.15 mg/dL — ABNORMAL HIGH (ref 0.44–1.00)
GFR, EST AFRICAN AMERICAN: 50 mL/min — AB (ref >60)
GFR, EST NON AFRICAN AMERICAN: 43 mL/min — AB (ref >60)
GLUCOSE: 112 mg/dL — AB (ref 65–99)
POTASSIUM: 4.1 mmol/L (ref 3.5–5.1)
Sodium: 134 mmol/L — ABNORMAL LOW (ref 135–145)

## 2017-06-17 LAB — TSH: TSH: 3.889 u[IU]/mL (ref 0.350–4.500)

## 2017-06-17 LAB — TROPONIN I: Troponin I: <0.03 ng/mL (ref <0.03)

## 2017-06-17 LAB — APTT: aPTT: 35 seconds (ref 24–36)

## 2017-06-17 LAB — BRAIN NATRIURETIC PEPTIDE: B NATRIURETIC PEPTIDE 5: 94.2 pg/mL (ref 0.0–100.0)

## 2017-06-17 MED ORDER — HYDRALAZINE HCL 25 MG PO TABS: 25.0000 mg | ORAL_TABLET | Freq: Three times a day (TID) | ORAL | 11 refills | Status: DC

## 2017-06-17 MED ORDER — METOPROLOL TARTRATE 25 MG PO TABS: 25.0000 mg | ORAL_TABLET | Freq: Two times a day (BID) | ORAL | 6 refills | Status: DC

## 2017-06-17 MED ORDER — ENSURE ENLIVE PO LIQD
237.0000 mL | Freq: Two times a day (BID) | ORAL | Status: DC
  Administered 2017-06-17: 237 mL via ORAL

## 2017-06-17 NOTE — Progress Notes (Signed)
  Echocardiogram 2D Echocardiogram has been performed.  Veronica Young 06/17/2017, 11:17 AM

## 2017-06-17 NOTE — Discharge Summary (Signed)
Discharge Summary    Patient ID: Veronica Young,  MRN: [id number], DOB/AGE: 1933/11/29 83 y.o.  Admit date: 06/16/2017 Discharge date: 06/17/2017  Primary Care Provider: Katherina Mires Primary Cardiologist:Dr. [name] Primary Electrophysiologist:Dr. Lovena Le PV: Dr. Gwenlyn Found   Discharge Diagnoses    Principal Problem:   Atrial tachycardia [facility]) Active Problems:   Hypercholesterolemia   Deafness   Claudication [facility])   PAD (peripheral artery disease) ([place])   Chronic diastolic CHF (congestive heart failure) (HCC)   HTN (hypertension)   Cardiac pacemaker in situ   CAD in native artery   Allergies Allergies  Allergen Reactions  . Amlodipine Nausea Only       . Benicar [Olmesartan Medoxomil] Nausea Only    Diagnostic Studies/Procedures    Echo 06/17/17 Study Conclusions  - Left ventricle: The cavity size was normal. Wall thickness was   normal. Systolic function was vigorous. The estimated ejection   fraction was in the range of 65% to 70%. Wall motion was normal;   there were no regional wall motion abnormalities. Doppler   parameters are consistent with abnormal left ventricular   relaxation (grade 1 diastolic dysfunction). The E/e&' ratio is   >15, suggesting elevated LV filling pressure. - Mitral valve: Mildly thickened leaflets . There was mild   regurgitation. - Left atrium: The atrium was normal in size. - Right ventricle: The cavity size was normal. Wall thickness was   normal. The moderator band was prominent. Pacer wire or catheter   noted in right ventricle. Systolic function was normal. - Right atrium: The atrium was normal in size. Pacer wire or   catheter noted in right atrium. - Tricuspid valve: There was moderate regurgitation. - Pulmonary arteries: PA peak pressure: 51 mm Hg (S). - Inferior vena cava: The vessel was dilated. The respirophasic   diameter changes were blunted (< 50%), consistent with elevated   central venous  pressure.  Impressions:  - Compared to a prior study in 2015, there are few changes. LVEF is   stable, RVSP is stable - the degree of diastolic dysfunction is   less, however, LV filling pressure is elevated. Pacer wires now   noted in the right heart.   History of Present Illness     83 y.o. female with a history of PAD, SSS s/p PPM placed 9242, chronic diastolic HF, DM-2, HTN, HLD and deafness presents with chest pain and palpitation 06/16/17.   Recently seen by Dr. Adora Fridge for PAD and pletal increased for increased claudication and right ABI of 0.46 and a left upper thigh. Plan to follow up in 3 months.   She had cath in 2005 with non obstructive disease and normal EF.   Last nuc 2010 with no ischemia and normal LV function and last Echo 2015 with EF 65-70% G2DD, moderate Pulmonary HTN and mild to mod TR.    She had rapid HR 6/24 and it slowed.  She was fine until after lunch on 6/24 then she had pudding and developed chest burning then her heart started racing.  She was nauseated and SOB a little diaphoretic.   She took NTG with relief and she took 3 baby ASA.  She called EMS.  Currently feels well and is eating.  She has MDT PPM and has been interrogated.  She had atrial tachycardia 50 min of it around 1:30 pm at rate of 182.  And 6 min of it about 3:30 pm.      Currently in ST  at 103  on telemetry in ER EKG I personally reviewed with ST and PVC borderline st abnormalities in lat leads. Na is 131, K+ 3.8, Cr 1.02  Troponin 0.01 Hgb 10.5 Hct 32.4 Wbc 5.3.   EKG with no pulmonary edema.  Her claudication has improved with pletal.      Hospital Course     Consultants: None  The patient was admitted and started on metoprolol 12.5mg  BID and heparin for anticoagulation. She only has chest pain when her heart is racing.  No recurrent episodes since starting metoprolol. No device report available for review. Rate improved however had few sec of atrial tachycardia prior to discharge.  Increased metoprolol to 25mg  BID. Reduced hydralazine to 25mg  TID for low BP. Thyroid function and electrolytes are normal. Take PRN metoprolol 25mg  for palpitations. Echo showed normal LV function.   Her hemoglobin has decreased from 10.5 to 8.6 overnight. Repeat check 9. No signs of bleeding. Heparin discontinued. No need to NOAC given no arrhthymias. Plan to get repeat CBC during follow up.   The patient has been seen by Dr. Oval Linsey today and deemed ready for discharge home. All follow-up appointments have been scheduled. Discharge medications are listed below.    Discharge Vitals Blood pressure (!) 97/51, pulse 63, temperature 97.9 F (36.6 C), temperature source Oral, resp. rate 20, height 5\' 6"  (1.676 m), weight 150 lb (68 kg), SpO2 93 %.  Filed Weights   06/16/17 2322 06/17/17 0522  Weight: 150 lb 1.6 oz (68.1 kg) 150 lb (68 kg)    Labs & Radiologic Studies     CBC  Recent Labs  06/16/17 1427 06/17/17 0506 06/17/17 1129  WBC 5.3 5.0  --   HGB 10.5* 8.6* 9.0*  HCT 32.4* 28.1* 29.2*  MCV 79.2 81.0  --   PLT 198 166  --    Basic Metabolic Panel  Recent Labs  06/16/17 1427 06/17/17 0024 06/17/17 0506  NA 131*  --  134*  K 3.8  --  4.1  CL 101  --  106  CO2 25  --  21*  GLUCOSE 164*  --  112*  BUN 20  --  18  CREATININE 1.02*  --  1.15*  CALCIUM 8.9  --  8.1*  MG  --  1.9  --    Liver Function Tests  Recent Labs  06/17/17 0024  AST 19  ALT 14  ALKPHOS 76  BILITOT 0.4  PROT 6.2*  ALBUMIN 3.2*   No results for input(s): LIPASE, AMYLASE in the last 72 hours. Cardiac Enzymes  Recent Labs  06/17/17 0024 06/17/17 0506 06/17/17 1129  TROPONINI <0.03 <0.03 <0.03   Fasting Lipid Panel  Recent Labs  06/17/17 0506  CHOL 179  HDL 45  LDLCALC 127*  TRIG 33  CHOLHDL 4.0   Thyroid Function Tests  Recent Labs  06/17/17 0024  TSH 3.889    Dg Chest 2 View  Result Date: 06/16/2017 CLINICAL DATA:  Acute onset of left-sided chest pain which  began earlier today. Indwelling pacemaker. Current history of hypertension, diabetes, diastolic congestive heart failure and hypothyroidism. EXAM: CHEST  2 VIEW COMPARISON:  09/15/2014, 07/27/2014 and earlier. FINDINGS: Cardiac silhouette mildly to moderately enlarged, unchanged. Left subclavian dual lead transvenous pacemaker unchanged and appears intact. Thoracic aorta mildly tortuous and atherosclerotic, unchanged. Hilar and mediastinal contours otherwise unremarkable. Prominent bronchovascular markings diffusely and mild central peribronchial thickening, unchanged, with associated hyperinflation. Linear atelectasis involving the lingula, best seen on the lateral image.  Lungs otherwise clear. No localized airspace consolidation. No pleural effusions. No pneumothorax. Normal pulmonary vascularity. Visualized bony thorax intact. Degenerative changes involving the upper lumbar spine. Thoracic levoscoliosis and thoracolumbar dextroscoliosis. IMPRESSION: 1. Linear atelectasis involving the lingula. No acute cardiopulmonary disease otherwise. 2. Stable mild cardiomegaly without pulmonary edema. 3. Stable COPD (chronic bronchitis). 4. Thoracic aortic atherosclerosis. Electronically Signed   By: Evangeline Dakin M.D.   On: 06/16/2017 15:17    Disposition   Pt is being discharged home today in good condition.  Follow-up Plans & Appointments    Follow-up Information    Almyra Deforest, [place]. Go on 06/24/2017.   Specialties:  Cardiology, Radiology Why:  @9 :30 am for post hospital follow up Contact information: [address] [place] [address] [place] [address] [phone]          Discharge Instructions    Diet - low sodium heart healthy    Complete by:  As directed    Increase activity slowly    Complete by:  As directed       Discharge Medications   Current Discharge Medication List    START taking these medications   Details  metoprolol tartrate (LOPRESSOR) 25 MG tablet Take 1 tablet (25 mg  total) by mouth 2 (two) times daily. Take extra 25mg  ( 1 tablet) for palpitations as needed. Qty: 75 tablet, Refills: 6      CONTINUE these medications which have CHANGED   Details  hydrALAZINE (APRESOLINE) 25 MG tablet Take 1 tablet (25 mg total) by mouth 3 (three) times daily. Qty: 90 tablet, Refills: 11      CONTINUE these medications which have NOT CHANGED   Details  aspirin EC 81 MG tablet Take 81 mg by mouth at bedtime.     atorvastatin (LIPITOR) 80 MG tablet Take 80 mg by mouth at bedtime.     atropine 1 % ophthalmic solution Place 1 drop into the left eye at bedtime.     brimonidine (ALPHAGAN) 0.2 % ophthalmic solution Place 1 drop into both eyes daily.    calcium-vitamin D (OSCAL WITH D) 500-200 MG-UNIT per tablet Take 1 tablet by mouth daily with breakfast.    cholecalciferol (VITAMIN D) 1000 UNITS tablet Take 1,000 Units by mouth daily.    cilostazol (PLETAL) 100 MG tablet Take 1 tablet (100 mg total) by mouth 2 (two) times daily. Qty: 60 tablet, Refills: 6    citalopram (CELEXA) 20 MG tablet Take 20 mg by mouth at bedtime.     dorzolamide-timolol (COSOPT) 22.3-6.8 MG/ML ophthalmic solution Place 1 drop into both eyes 2 (two) times daily.     latanoprost (XALATAN) 0.005 % ophthalmic solution Place 1 drop into both eyes daily. Refills: 12    levothyroxine (SYNTHROID, LEVOTHROID) 112 MCG tablet Take 112 mcg by mouth daily before breakfast.     lisinopril (PRINIVIL,ZESTRIL) 40 MG tablet Take 1 tablet (40 mg total) by mouth daily. Qty: 90 tablet, Refills: 1    potassium chloride (K-DUR,KLOR-CON) 10 MEQ tablet Take 10 mEq by mouth daily.         Outstanding Labs/Studies   CBC at follow up  Duration of Discharge Encounter   Greater than 30 minutes including physician time.  Signed, Phil Michels PA-C 06/17/2017, 3:18 PM

## 2017-06-17 NOTE — Progress Notes (Signed)
Progress Note  Patient Name: Veronica Young Date of Encounter: 06/17/2017  Primary Cardiologist: Dr. [name] Primary Electrophysiologist:  Dr. Lovena Le PV: Dr. Gwenlyn Found   Subjective   No recurrent chest pain or palpitations. No dyspnea. Her chest pain and palpitation occurred at same time. Sign language interpretor used for conversation.   Inpatient Medications    Scheduled Meds: . aspirin EC  81 mg Oral Daily  . atorvastatin  80 mg Oral QHS  . atropine  1 drop Left Eye QHS  . brimonidine  1 drop Both Eyes Daily  . calcium-vitamin D  1 tablet Oral Q breakfast  . cholecalciferol  1,000 Units Oral Daily  . cilostazol  100 mg Oral BID  . citalopram  20 mg Oral QHS  . dorzolamide-timolol  1 drop Both Eyes BID  . feeding supplement (ENSURE ENLIVE)  237 mL Oral BID BM  . hydrALAZINE  50 mg Oral TID  . latanoprost  1 drop Both Eyes Daily  . levothyroxine  112 mcg Oral QAC breakfast  . lisinopril  40 mg Oral Daily  . metoprolol tartrate  12.5 mg Oral BID  . potassium chloride  10 mEq Oral Daily   Continuous Infusions: . sodium chloride 10 mL/hr at 06/17/17 0020  . heparin 1,000 Units/hr (06/17/17 0020)   PRN Meds: acetaminophen, nitroGLYCERIN, ondansetron (ZOFRAN) IV   Vital Signs    Vitals:   06/16/17 2329 06/17/17 0436 06/17/17 0447 06/17/17 0522  BP: (!) 181/67 (!) 116/39    Pulse: 71 67    Resp: 19 18    Temp: 98.1 F (36.7 C) 98.7 F (37.1 C)    TempSrc: Oral Oral    SpO2: 95% (!) 82% 91%   Weight:    150 lb (68 kg)  Height:        Intake/Output Summary (Last 24 hours) at 06/17/17 1024 Last data filed at 06/16/17 1840  Gross per 24 hour  Intake              999 ml  Output                0 ml  Net              999 ml   Filed Weights   06/16/17 2322 06/17/17 0522  Weight: 150 lb 1.6 oz (68.1 kg) 150 lb (68 kg)    Telemetry    A paced at rate of 60-70s this mornign. Intermittent tachycardia yesterday  - Personally Reviewed  ECG    None today    Physical Exam   GEN: No acute distress.   Neck: No JVD Cardiac: RRR, no murmurs, rubs, or gallops. R leg with dressing. Mild bilateral edema Respiratory: faint rales on R side GI: Soft, nontender, non-distended  MS: No edema; No deformity. Neuro:  Nonfocal  Psych: Normal affect   Labs    Chemistry Recent Labs Lab 06/16/17 1427 06/17/17 0024 06/17/17 0506  NA 131*  --  134*  K 3.8  --  4.1  CL 101  --  106  CO2 25  --  21*  GLUCOSE 164*  --  112*  BUN 20  --  18  CREATININE 1.02*  --  1.15*  CALCIUM 8.9  --  8.1*  PROT  --  6.2*  --   ALBUMIN  --  3.2*  --   AST  --  19  --   ALT  --  14  --   ALKPHOS  --  76  --   BILITOT  --  0.4  --   GFRNONAA 50*  --  43*  GFRAA 58*  --  50*  ANIONGAP 5  --  7     Hematology Recent Labs Lab 06/16/17 1427 06/17/17 0506  WBC 5.3 5.0  RBC 4.09 3.47*  HGB 10.5* 8.6*  HCT 32.4* 28.1*  MCV 79.2 81.0  MCH 25.7* 24.8*  MCHC 32.4 30.6  RDW 16.5* 17.0*  PLT 198 166    Cardiac Enzymes Recent Labs Lab 06/17/17 0024 06/17/17 0506  TROPONINI <0.03 <0.03    Recent Labs Lab 06/16/17 1436  TROPIPOC 0.01     BNP Recent Labs Lab 06/17/17 0024  BNP 94.2     DDimer No results for input(s): DDIMER in the last 168 hours.   Radiology    Dg Chest 2 View  Result Date: 06/16/2017 CLINICAL DATA:  Acute onset of left-sided chest pain which began earlier today. Indwelling pacemaker. Current history of hypertension, diabetes, diastolic congestive heart failure and hypothyroidism. EXAM: CHEST  2 VIEW COMPARISON:  09/15/2014, 07/27/2014 and earlier. FINDINGS: Cardiac silhouette mildly to moderately enlarged, unchanged. Left subclavian dual lead transvenous pacemaker unchanged and appears intact. Thoracic aorta mildly tortuous and atherosclerotic, unchanged. Hilar and mediastinal contours otherwise unremarkable. Prominent bronchovascular markings diffusely and mild central peribronchial thickening, unchanged, with associated  hyperinflation. Linear atelectasis involving the lingula, best seen on the lateral image. Lungs otherwise clear. No localized airspace consolidation. No pleural effusions. No pneumothorax. Normal pulmonary vascularity. Visualized bony thorax intact. Degenerative changes involving the upper lumbar spine. Thoracic levoscoliosis and thoracolumbar dextroscoliosis. IMPRESSION: 1. Linear atelectasis involving the lingula. No acute cardiopulmonary disease otherwise. 2. Stable mild cardiomegaly without pulmonary edema. 3. Stable COPD (chronic bronchitis). 4. Thoracic aortic atherosclerosis. Electronically Signed   By: Evangeline Dakin M.D.   On: 06/16/2017 15:17    Cardiac Studies   Pending echo this admission  Patient Profile     83 y.o. female with a history of PAD, SSS s/p PPM placed 2992, chronic diastolic HF, DM-2, HTN, HLD and deafness presents with chest pain and palpitation.   Assessment & Plan    1. Atrial tachycardia - no device report available for review. Seem 50 minutes of atrial tachycardia. Telemetry did showed atrial tachycardia yesterday around 3pm. Now her rate improved to 60-70s with a paced rhythm. MD to review tele.  - TSH normal. Pending echo.  2. Chest pain - Resolved. Likely rate related. Troponin negative.   3. Chronic diastolic CHF - BNP normal. Faint right base rales.   4. Anemia - Hgb 8.6 today. It was 10.5. Monitor of sign of bleeding on IV heparin.   5. PAD - followed by Dr. Gwenlyn Found. Conservative management currently.   6. R leg wound   Signed, Liliya Fullenwider, PA  06/17/2017, 10:24 AM

## 2017-06-17 NOTE — Progress Notes (Signed)
Interpreter services notified of need for a sign language interpreter  for pt, pt going home and discharge instructions will need to be reviewed with pt and family.  They will contact the interpreter and have them come to pt's room.

## 2017-06-17 NOTE — Care Management CC44 (Signed)
Condition Code 44 Documentation Completed  Patient Details  Name: KATTLEYA KUHNERT MRN: [id number] Date of Birth: 29-Nov-1933   Condition Code 44 given:  Yes Patient signature on Condition Code 44 notice:  Yes Documentation of 2 MD's agreement:  Yes Code 44 added to claim:  Yes    Dawayne Patricia, RN 06/17/2017, 4:43 PM

## 2017-06-17 NOTE — Consult Note (Addendum)
[facility] Nurse wound consult note Reason for Consult: Consult requested for right leg wound.  Pt states she injured it prior to admission; right posterior leg with partial thickness puncture wound. Measurement: .1X.1cm pinhole opening Wound bed: no erythremia surrounding the site, right leg with mod amt generalized edema Drainage (amount, consistency, odor) mod amt yellow drainage, no odor Dressing procedure/placement/frequency: Foam dressing to absorb drainage and promote healing.  Translator at the bedside to answer questions from daughter and patient. Please re-consult if further assistance is needed.  Thank-you,  Julien Girt MSN, [place], [place], [place], [place]

## 2017-06-17 NOTE — Progress Notes (Signed)
Discharge instructions reviewed with pt and her daughter with the assist of an interpreter for sign language. Pt given handout on new med metoprolol and made sure she knew when to take, and when to take an extra prn dose for palpitations. Also made sure she knew her hydralazine dose had been reduced to 25mg  TID. Pt able to sign back to interpreter when and how much she is to take of her medications.  Copy of instructions given to pt, pt's new scripts were sent in electronically to pt's pharmacy.  Pt d/c'd via wheelchair with belongings, with daughter.           Escorted by unit NT.

## 2017-06-17 NOTE — Care Management Obs Status (Signed)
[place] NOTIFICATION   Patient Details  Name: Veronica Young MRN: [id number] Date of Birth: [birth year]   Medicare Observation Status Notification Given:  Yes    Dawayne Patricia, RN 06/17/2017, 4:43 PM

## 2017-06-18 LAB — HEMOGLOBIN A1C
HEMOGLOBIN A1C: 5.7 % — AB (ref 4.8–5.6)
MEAN PLASMA GLUCOSE: 117 mg/dL

## 2017-06-24 ENCOUNTER — Ambulatory Visit (INDEPENDENT_AMBULATORY_CARE_PROVIDER_SITE_OTHER): Payer: Medicare HMO | Admitting: Physician Assistant

## 2017-06-24 ENCOUNTER — Encounter: Payer: Self-pay | Admitting: Physician Assistant

## 2017-06-24 VITALS — BP 200/90 | HR 80 | Ht 66.0 in | Wt 146.0 lb

## 2017-06-24 DIAGNOSIS — E119 Type 2 diabetes mellitus without complications: Secondary | ICD-10-CM

## 2017-06-24 DIAGNOSIS — Z950 Presence of cardiac pacemaker: Secondary | ICD-10-CM

## 2017-06-24 DIAGNOSIS — I739 Peripheral vascular disease, unspecified: Secondary | ICD-10-CM | POA: Diagnosis not present

## 2017-06-24 DIAGNOSIS — E785 Hyperlipidemia, unspecified: Secondary | ICD-10-CM

## 2017-06-24 DIAGNOSIS — I10 Essential (primary) hypertension: Secondary | ICD-10-CM

## 2017-06-24 MED ORDER — BLOOD PRESSURE MONITOR KIT: 1.0000 [IU] | PACK | Freq: Once | 0 refills | Status: AC

## 2017-06-24 MED ORDER — HYDRALAZINE HCL 25 MG PO TABS: 37.5000 mg | ORAL_TABLET | Freq: Three times a day (TID) | ORAL | 11 refills | Status: DC

## 2017-06-24 NOTE — Progress Notes (Addendum)
Cardiology Office Note    Date:  06/24/2017   ID:  [name], DOB 1933/10/14, MRN [id number]  PCP:  Veronica Mires, MD  Cardiologist:  Dr.Jordan Young specialist: Dr. Gwenlyn Young Electrophysiologist: Dr. Lovena Young  Chief Complaint  Patient presents with  . Follow-up    seen for Dr. [name].     History of Present Illness:  Veronica Young is a 83 y.o. female with PMH of HTN, HLD, DM II, PAD with known occluded popliteal arteries, SSS s/p PPM 6/57 and diastolic heart failure with normal EF. She is deaf and require interpreter to communicate. Her recent ABI on 05/30/2017 showed right ABI 0.46, left ABI 0.50, left TBI 0.27. She was seen by Dr. Gwenlyn Young for peripheral arterial disease 06/13/2014 who recommended lower extremity arterial Doppler. Unfortunately she was admitted on 06/16/2017 with chest pain. This occurred in the setting of atrial tachycardia with heart rate of 150s. Thyroid and in electrolyte were normal. Serial troponin was negative. Fasting lipid panel showed LDL of 125. Hemoglobin was 8.6. Echocardiogram obtained on 08/17/2017 showed EF 84-69%, grade 1 diastolic dysfunction, moderate TR, mild MR, PA peak pressure 51 mmHg. She was subsequently discharged with the instruction to take an additional 25 mg metoprolol on as needed basis for recurrent episode.  She presents to cardiology office visit today. She says she no longer have any chest discomfort since she left the hospital. Her blood pressure today however is quite elevated with systolic blood pressure 629. She did take her blood pressure medication around 7 AM this morning. She denies any dizziness, headache, or ipsilateral weakness. Looking back, her hydralazine was increased to 37.5 mg 3 times a day in March, however due to episode of hypotension right before her recent discharge, her hydralazine was decreased to 25 mg 3 times a day. I did manually recheck her blood pressure again, systolic blood pressure remaining in 200. She says she does  have a blood pressure cuff which she bought last year, however it is already broken. I recommended her to obtain another blood pressure cuff and keep a blood pressure diary with 2 readings per day for the next 2 weeks. I will see her back in 2 weeks for further adjustment of the medication. I did increase her hydralazine back up to 37.5 mg 3 times a day but I am hesitant to be too aggressive on adjusting her medication until I know what her blood pressure is like at home. At some point we may need to consider a 24-hour ambulatory blood pressure cuff to assess 24 hour blood pressure variations.  Translation provided by Ms. Veronica Young of [facility]  Past Medical History:  Diagnosis Date  . Carotid bruit   . CHF (congestive heart failure) ([place])   . Deafness   . Diabetes mellitus    type 2  . Diastolic heart failure ([facility])    EF is 60%  . GERD (gastroesophageal reflux disease)   . Glaucoma   . Hypercholesterolemia   . Hypertension   . Hypothyroidism   . Normal cardiac stress test January 2014  . PAD (peripheral artery disease) ([place])    a. known bilateral popliteal occlusions with conservative management favored.  [name] PAT (paroxysmal atrial tachycardia) ([place])   . Sick sinus syndrome [facility])    a. s/p PPM placement 07/2014    Past Surgical History:  Procedure Laterality Date  . cholycystectomy    . left breast cyst removed    . PACEMAKER INSERTION  07-26-2014  MDT ADDRL1 pacemaker implanted by Dr Veronica Young for SSS  . PERMANENT PACEMAKER INSERTION N/A 07/26/2014   Procedure: PERMANENT PACEMAKER INSERTION;  Surgeon: Evans Lance, MD;  Location[facility] C[facility] LAB;  Service: Cardiovascular;  Laterality: N/A;  . TOTAL KNEE ARTHROPLASTY     right    Current Medications: Outpatient Medications Prior to Visit  Medication Sig Dispense Refill  . aspirin EC 81 MG tablet Take 81 mg by mouth at bedtime.     [name] atorvastatin (LIPITOR) 80 MG tablet Take 80 mg by mouth at bedtime.     [name] atropine 1 % ophthalmic  solution Place 1 drop into the left eye at bedtime.     . brimonidine (ALPHAGAN) 0.2 % ophthalmic solution Place 1 drop into both eyes daily.    . calcium-vitamin D (OSCAL WITH D) 500-200 MG-UNIT per tablet Take 1 tablet by mouth daily with breakfast.    . cholecalciferol (VITAMIN D) 1000 UNITS tablet Take 1,000 Units by mouth daily.    . cilostazol (PLETAL) 100 MG tablet Take 1 tablet (100 mg total) by mouth 2 (two) times daily. 60 tablet 6  . citalopram (CELEXA) 20 MG tablet Take 20 mg by mouth at bedtime.     . dorzolamide-timolol (COSOPT) 22.3-6.8 MG/ML ophthalmic solution Place 1 drop into both eyes 2 (two) times daily.     [name] latanoprost (XALATAN) 0.005 % ophthalmic solution Place 1 drop into both eyes daily.  12  . levothyroxine (SYNTHROID, LEVOTHROID) 112 MCG tablet Take 112 mcg by mouth daily before breakfast.     . lisinopril (PRINIVIL,ZESTRIL) 40 MG tablet Take 1 tablet (40 mg total) by mouth daily. 90 tablet 1  . metoprolol tartrate (LOPRESSOR) 25 MG tablet Take 1 tablet (25 mg total) by mouth 2 (two) times daily. Take extra 25mg  ( 1 tablet) for palpitations as needed. 75 tablet 6  . potassium chloride (K-DUR,KLOR-CON) 10 MEQ tablet Take 10 mEq by mouth daily.    . hydrALAZINE (APRESOLINE) 25 MG tablet Take 1 tablet (25 mg total) by mouth 3 (three) times daily. 90 tablet 11   No facility-administered medications prior to visit.      Allergies:   Amlodipine and Benicar [olmesartan medoxomil]   Social History   Social History  . Marital status: Widowed    Spouse name: N/A  . Number of children: 4  . Years of education: N/A   Social History Main Topics  . Smoking status: Never Smoker  . Smokeless tobacco: Never Used  . Alcohol use No  . Drug use: No  . Sexual activity: No   Other Topics Concern  . None   Social History Narrative  . None     Family History:  The patient's family history includes Heart disease in her brother, mother, and sister; Liver cancer in her  father; Stroke in her sister.   ROS:   Please see the history of present illness.    ROS All other systems reviewed and are negative.   PHYSICAL EXAM:   VS:  BP (!) 200/90 (BP Location: Right Arm)   Pulse 80   Ht 5\' 6"  (1.676 m)   Wt 146 lb (66.2 kg)   BMI 23.57 kg/m    GEN: Well nourished, well developed, in no acute distress  HEENT: normal  Neck: no JVD, carotid bruits, or masses Cardiac: RRR; no murmurs, rubs, or gallops,no edema  Respiratory:  clear to auscultation bilaterally, normal work of breathing GI: soft, nontender, nondistended, + BS MS: no  deformity or atrophy  Skin: warm and dry, no rash Neuro:  Alert and Oriented x 3, Strength and sensation are intact Psych: euthymic mood, full affect  Wt Readings from Last 3 Encounters:  06/24/17 146 lb (66.2 kg)  06/17/17 150 lb (68 kg)  06/13/17 142 lb (64.4 kg)      Studies/Labs Reviewed:   EKG:  EKG is not ordered today.    Recent Labs: 06/17/2017: ALT 14; B Natriuretic Peptide 94.2; BUN 18; Creatinine, Ser 1.15; Hemoglobin 9.0; Magnesium 1.9; Platelets 166; Potassium 4.1; Sodium 134; TSH 3.889   Lipid Panel    Component Value Date/Time   CHOL 179 06/17/2017 0506   TRIG 33 06/17/2017 0506   HDL 45 06/17/2017 0506   CHOLHDL 4.0 06/17/2017 0506   VLDL 7 06/17/2017 0506   LDLCALC 127 (H) 06/17/2017 0506    Additional studies/ records that were reviewed today include:   ABI 05/30/2017 Impressions Right ABI has decreased compared to prior exam, now in severe range of flow reduction. Left ABI had decreased but remains in the moderate range. Stable amplitude in right posterior tibial and bilateral peroneal and bilateral anterior tibial artery waveforms. Decrease in amplitude in left posterior tibial artery CW waveforms and bilateral great toe PPG's. Right great toe PPG's are absent. Left great toe-brachial index is abnormal.     Echo 06/17/2017 LV EF: 65% -   70%  Study Conclusions  - Left ventricle: The  cavity size was normal. Wall thickness was   normal. Systolic function was vigorous. The estimated ejection   fraction was in the range of 65% to 70%. Wall motion was normal;   there were no regional wall motion abnormalities. Doppler   parameters are consistent with abnormal left ventricular   relaxation (grade 1 diastolic dysfunction). The E/e&' ratio is   >15, suggesting elevated LV filling pressure. - Mitral valve: Mildly thickened leaflets . There was mild   regurgitation. - Left atrium: The atrium was normal in size. - Right ventricle: The cavity size was normal. Wall thickness was   normal. The moderator band was prominent. Pacer wire or catheter   noted in right ventricle. Systolic function was normal. - Right atrium: The atrium was normal in size. Pacer wire or   catheter noted in right atrium. - Tricuspid valve: There was moderate regurgitation. - Pulmonary arteries: PA peak pressure: 51 mm Hg (S). - Inferior vena cava: The vessel was dilated. The respirophasic   diameter changes were blunted (< 50%), consistent with elevated   central venous pressure.  Impressions:  - Compared to a prior study in 2015, there are few changes. LVEF is   stable, RVSP is stable - the degree of diastolic dysfunction is   less, however, LV filling pressure is elevated. Pacer wires now   noted in the right heart.   ASSESSMENT:    1. Malignant hypertension   2. Hyperlipidemia, unspecified hyperlipidemia type   3. Controlled type 2 diabetes mellitus without complication, without long-term current use of insulin ([place])   4. PAD (peripheral artery disease) ([place])   5. Pacemaker      PLAN:  In order of problems listed above:  1. Malignant hypertension: Blood pressure 200/90 today, I manually checked her blood pressure, it was the same. Looking back, it appears she was started on 37.5 mg 3 times a day of hydralazine in March, the dose of hydralazine was recently decreased to 25 mg 3 times a  day after her blood pressure dropped right  before discharge. I will increase it back up to 37.5 mg 3 times a day. She will need to keep a blood pressure diary with 2 readings per day. I will see her in 2 weeks for further blood pressure adjustment. Am hesitant to use clonidine at this time, however may need to consider this in the future. She is aware of potential effect from uncontrolled blood pressure including possibility of stroke, she denies any ipsilateral weakness, blurred vision or significant headache today. I asked her to take a higher dose of hydralazine once she gets home.  2. Hyperlipidemia: On Lipitor 80 mg daily.  3. DM 2: Despite she carries a diagnosis of diabetes, she is not on any diabetes medication. I do see that she had a recent hemoglobin A1c of 5.7 which really put her in the prediabetes range. Continue to control it with diet  4. Peripheral artery disease: Recently abnormal ABI noted, she is due for peripheral arterial ultrasound ordered by Dr. Gwenlyn Young.  5. SSS s/p pacemaker: Followed by Dr. Lovena Young    Medication Adjustments/Labs and Tests Ordered: Current medicines are reviewed at length with the patient today.  Concerns regarding medicines are outlined above.  Medication changes, Labs and Tests ordered today are listed in the Patient Instructions below. Patient Instructions  Medication Instructions:   INCREASE Hydralazine to 37.5mg  THREE TIMES DAILY. (Each dose will be increased from 1 tablet to 1.5 tablets)  Your provider has recommended you purchase a new blood pressure cuff. Omron is the recommended brand. Take blood pressure readings TWICE DAILY and keep a journal of these. Bring these readings to your next appointment.  Labwork:   none  Testing/Procedures:  none  Follow-Up:  2 weeks with Almyra Deforest PA  If you need a refill on your cardiac medications before your next appointment, please call your pharmacy.      Hilbert Corrigan, [place]  06/24/2017 6:08 PM     [facility] [place], [place], [place]  [address] Phone: [phone]; Fax: [phone]

## 2017-06-24 NOTE — Patient Instructions (Signed)
Medication Instructions:   INCREASE Hydralazine to 37.5mg  THREE TIMES DAILY. (Each dose will be increased from 1 tablet to 1.5 tablets)  Your provider has recommended you purchase a new blood pressure cuff. Omron is the recommended brand. Take blood pressure readings TWICE DAILY and keep a journal of these. Bring these readings to your next appointment.  Labwork:   none  Testing/Procedures:  none  Follow-Up:  2 weeks with Almyra Deforest PA  If you need a refill on your cardiac medications before your next appointment, please call your pharmacy.

## 2017-06-30 ENCOUNTER — Ambulatory Visit (INDEPENDENT_AMBULATORY_CARE_PROVIDER_SITE_OTHER): Payer: Medicare HMO | Admitting: *Deleted

## 2017-06-30 ENCOUNTER — Telehealth: Payer: Self-pay | Admitting: Cardiology

## 2017-06-30 DIAGNOSIS — I495 Sick sinus syndrome: Secondary | ICD-10-CM

## 2017-06-30 NOTE — Telephone Encounter (Signed)
Spoke with pt and reminded pt of remote transmission that is due today. Pt verbalized understanding.   

## 2017-07-03 NOTE — Progress Notes (Signed)
Remote pacemaker transmission.   

## 2017-07-04 ENCOUNTER — Encounter: Payer: Self-pay | Admitting: Cardiology

## 2017-07-08 NOTE — Progress Notes (Signed)
Cardiology Office Note    Date:  07/09/2017   ID:  Veronica Young, DOB [birth year], MRN [id number]  PCP:  Katherina Mires, MD  Cardiologist:   Dr.Jordan [place] specialist: Dr. Gwenlyn Found Electrophysiologist: Dr. Lovena Le  Chief Complaint  Patient presents with  . Follow-up    seen for Dr. [name].    History of Present Illness:  Veronica Young is a 83 y.o. female with PMH of HTN, HLD, DM II, PAD with known occluded popliteal arteries, SSS s/p PPM 8/41 and diastolic heart failure with normal EF. She is deaf and require interpreter to communicate. Her recent ABI on 05/30/2017 showed right ABI 0.46, left ABI 0.50, left TBI 0.27. She was seen by Dr. Gwenlyn Found for peripheral arterial disease 06/13/2014 who recommended lower extremity arterial Doppler. Unfortunately she was admitted on 06/16/2017 with chest pain. This occurred in the setting of atrial tachycardia with heart rate of 150s. Thyroid and electrolyte were normal. Serial troponin was negative. Fasting lipid panel showed LDL of 125. Hemoglobin was 8.6. Echocardiogram obtained on 08/17/2017 showed EF 32-44%, grade 1 diastolic dysfunction, moderate TR, mild MR, PA peak pressure 51 mmHg. She was subsequently discharged with the instruction to take an additional 25 mg metoprolol on as needed basis for recurrent episode.  I last saw the patient on 06/24/2017, she was no longer having chest pain. However her blood pressure was quite elevated with systolic blood pressure of 200. Looking back, her hydralazine was increased to 37.5 mg 3 times a day in March, however due to episode of hypotension right before her recent discharge, her hydralazine was decreased to 25 mg 3 times a day instead. I did manually check her blood pressure, her systolic blood pressure remained in the 200 range. Therefore I increased her hydralazine back up to 37.5 mg 3 times a day. I was hesitant to be too aggressive on adjusting her blood pressure medication without knowing her home blood  pressure. I asked her to keep a blood pressure diary for 2 weeks.  She returned today for follow-up. She has been doing quite well since our last visit and denies any significant symptom. She brought her home blood pressure cuff with her, we have measured against our office blood pressure cuff which was quite consistent with the reading. Despite her blood pressure was in the 170s in the office today, her home blood pressure reading has been mostly in the 130s, with occasional blood pressure dipping down to 109. I will continue her on the current blood pressure medication. Otherwise she denies any significant discomfort. She has no lower extremity edema, orthopnea or PND. She denies any recurrence of chest pain or palpitation. She is due for ABI greater than is a small and will see Dr. Gwenlyn Found in September. She can follow-up with Dr. [name] in 6 months.    Interview was conducted via Sales executive Mrs. Sarina Ser    Past Medical History:  Diagnosis Date  . Carotid bruit   . CHF (congestive heart failure) ([place])   . Deafness   . Diabetes mellitus    type 2  . Diastolic heart failure (HCC)    EF is 60%  . GERD (gastroesophageal reflux disease)   . Glaucoma   . Hypercholesterolemia   . Hypertension   . Hypothyroidism   . Normal cardiac stress test January 2014  . PAD (peripheral artery disease) ([place])    a. known bilateral popliteal occlusions with conservative management favored.  [name] PAT (paroxysmal atrial tachycardia) (  [place])   . Sick sinus syndrome ([place])    a. s/p PPM placement 07/2014    Past Surgical History:  Procedure Laterality Date  . cholycystectomy    . left breast cyst removed    . PACEMAKER INSERTION  07-26-2014   MDT ADDRL1 pacemaker implanted by Dr Lovena Le for SSS  . PERMANENT PACEMAKER INSERTION N/A 07/26/2014   Procedure: PERMANENT PACEMAKER INSERTION;  Surgeon: Evans Lance, MD;  Location: [facility] CATH LAB;  Service: Cardiovascular;  Laterality: N/A;  . TOTAL KNEE  ARTHROPLASTY     right    Current Medications: Outpatient Medications Prior to Visit  Medication Sig Dispense Refill  . aspirin EC 81 MG tablet Take 81 mg by mouth at bedtime.     [name] atorvastatin (LIPITOR) 80 MG tablet Take 80 mg by mouth at bedtime.     [name] atropine 1 % ophthalmic solution Place 1 drop into the left eye at bedtime.     . brimonidine (ALPHAGAN) 0.2 % ophthalmic solution Place 1 drop into both eyes daily.    . calcium-vitamin D (OSCAL WITH D) 500-200 MG-UNIT per tablet Take 1 tablet by mouth daily with breakfast.    . cholecalciferol (VITAMIN D) 1000 UNITS tablet Take 1,000 Units by mouth daily.    . cilostazol (PLETAL) 100 MG tablet Take 1 tablet (100 mg total) by mouth 2 (two) times daily. 60 tablet 6  . citalopram (CELEXA) 20 MG tablet Take 20 mg by mouth at bedtime.     . dorzolamide-timolol (COSOPT) 22.3-6.8 MG/ML ophthalmic solution Place 1 drop into both eyes 2 (two) times daily.     . hydrALAZINE (APRESOLINE) 25 MG tablet Take 1.5 tablets (37.5 mg total) by mouth 3 (three) times daily. 135 tablet 11  . latanoprost (XALATAN) 0.005 % ophthalmic solution Place 1 drop into both eyes daily.  12  . levothyroxine (SYNTHROID, LEVOTHROID) 112 MCG tablet Take 112 mcg by mouth daily before breakfast.     . lisinopril (PRINIVIL,ZESTRIL) 40 MG tablet Take 1 tablet (40 mg total) by mouth daily. 90 tablet 1  . metoprolol tartrate (LOPRESSOR) 25 MG tablet Take 1 tablet (25 mg total) by mouth 2 (two) times daily. Take extra 25mg  ( 1 tablet) for palpitations as needed. 75 tablet 6  . potassium chloride (K-DUR,KLOR-CON) 10 MEQ tablet Take 10 mEq by mouth daily.     No facility-administered medications prior to visit.      Allergies:   Amlodipine and Benicar [olmesartan medoxomil]   Social History   Social History  . Marital status: Widowed    Spouse name: N/A  . Number of children: 4  . Years of education: N/A   Social History Main Topics  . Smoking status: Never Smoker  .  Smokeless tobacco: Never Used  . Alcohol use No  . Drug use: No  . Sexual activity: No   Other Topics Concern  . None   Social History Narrative  . None     Family History:  The patient's family history includes Heart disease in her brother, mother, and sister; Liver cancer in her father; Stroke in her sister.   ROS:   Please see the history of present illness.    ROS All other systems reviewed and are negative.   PHYSICAL EXAM:   VS:  BP (!) 170/78 (BP Location: Left Arm, Cuff Size: Normal)   Pulse 80   Resp (!) 98   Ht 5\' 6"  (1.676 m)   Wt 148 lb 3.2 oz (  67.2 kg)   BMI 23.92 kg/m    GEN: Well nourished, well developed, in no acute distress  HEENT: normal  Neck: no JVD, carotid bruits, or masses Cardiac: RRR; no murmurs, rubs, or gallops,no edema  Respiratory:  clear to auscultation bilaterally, normal work of breathing GI: soft, nontender, nondistended, + BS MS: no deformity or atrophy  Skin: warm and dry, no rash Neuro:  Alert and Oriented x 3, Strength and sensation are intact Psych: euthymic mood, full affect  Wt Readings from Last 3 Encounters:  07/09/17 148 lb 3.2 oz (67.2 kg)  06/24/17 146 lb (66.2 kg)  06/17/17 150 lb (68 kg)      Studies/Labs Reviewed:   EKG:  EKG is not ordered today.   Recent Labs: 06/17/2017: ALT 14; B Natriuretic Peptide 94.2; BUN 18; Creatinine, Ser 1.15; Hemoglobin 9.0; Magnesium 1.9; Platelets 166; Potassium 4.1; Sodium 134; TSH 3.889   Lipid Panel    Component Value Date/Time   CHOL 179 06/17/2017 0506   TRIG 33 06/17/2017 0506   HDL 45 06/17/2017 0506   CHOLHDL 4.0 06/17/2017 0506   VLDL 7 06/17/2017 0506   LDLCALC 127 (H) 06/17/2017 0506    Additional studies/ records that were reviewed today include:   ABI 05/30/2017 Impressions Right ABI has decreased compared to prior exam, now in severe range of flow reduction. Left ABI had decreased but remains in the moderate range. Stable amplitude in right posterior tibial  and bilateral peroneal and bilateral anterior tibial artery waveforms. Decrease in amplitude in left posterior tibial artery CW waveforms and bilateral great toe PPG's. Right great toe PPG's are absent. Left great toe-brachial index is abnormal.     Echo 06/17/2017 LV EF: 65% - 70%  Study Conclusions  - Left ventricle: The cavity size was normal. Wall thickness was normal. Systolic function was vigorous. The estimated ejection fraction was in the range of 65% to 70%. Wall motion was normal; there were no regional wall motion abnormalities. Doppler parameters are consistent with abnormal left ventricular relaxation (grade 1 diastolic dysfunction). The E/e&' ratio is >15, suggesting elevated LV filling pressure. - Mitral valve: Mildly thickened leaflets . There was mild regurgitation. - Left atrium: The atrium was normal in size. - Right ventricle: The cavity size was normal. Wall thickness was normal. The moderator band was prominent. Pacer wire or catheter noted in right ventricle. Systolic function was normal. - Right atrium: The atrium was normal in size. Pacer wire or catheter noted in right atrium. - Tricuspid valve: There was moderate regurgitation. - Pulmonary arteries: PA peak pressure: 51 mm Hg (S). - Inferior vena cava: The vessel was dilated. The respirophasic diameter changes were blunted (<50%), consistent with elevated central venous pressure.  Impressions:  - Compared to a prior study in 2015, there are few changes. LVEF is stable, RVSP is stable - the degree of diastolic dysfunction is less, however, LV filling pressure is elevated. Pacer wires now noted in the right heart.    ASSESSMENT:    1. Essential hypertension   2. Hyperlipidemia, unspecified hyperlipidemia type   3. Prediabetes   4. PAD (peripheral artery disease) ([place])   5. Pacemaker      PLAN:  In order of problems listed above:  1. Malignant  hypertension: Blood pressure continued to be high in the office, she has recorded a week of blood pressure reading, most of the time systolic blood pressure is in the 130s range at home. I also doubled checked her electronic blood  pressure recorder reading with the one in the office, her recorder is accurate. Will continue on the current medication.  2. Hyperlipidemia: On Lipitor 80 mg daily  3. Prediabetes: Recent hemoglobin A1C 5.7.  4. Peripheral artery disease: Upcoming ABI and peripheral arterial ultrasound ordered by Dr. Henrene Pastor. Follow-up with Dr. Gwenlyn Found in September.  5. SSS s/p pacemaker: Followed by Dr. Lovena Le.    Medication Adjustments/Labs and Tests Ordered: Current medicines are reviewed at length with the patient today.  Concerns regarding medicines are outlined above.  Medication changes, Labs and Tests ordered today are listed in the Patient Instructions below. Patient Instructions  Medication Instructions:  Your physician recommends that you continue on your current medications as directed. Please refer to the Current Medication list given to you today. If you need a refill on your cardiac medications before your next appointment, please call your pharmacy.  Testing/Procedures: LE ARTERIAL 07-16-2017 @10AM  HER AT OUR OFFICE  Follow-Up: Your physician wants you to follow-up in: 6 MONTHS WITH DR [name]. You will receive a reminder letter in the mail two months in advance. If you don't receive a letter, please call our office November 2018 to schedule the January 2019 follow-up appointment.  Special Instructions: KEEP FOLLOW UP APPOINTMENT AS SCHEDULED WITH DR Gwenlyn Found 09-10-2017 @1015AM   CONTINUE TO MONITOR BP TWICE WEEKLY    Thank you for choosing [facility] at [employer], [place]  07/09/2017 1:06 PM    [facility] [address], [place], [place]  [address] Phone: [phone]; Fax: [phone]

## 2017-07-09 ENCOUNTER — Ambulatory Visit (INDEPENDENT_AMBULATORY_CARE_PROVIDER_SITE_OTHER): Payer: Medicare HMO | Admitting: Physician Assistant

## 2017-07-09 ENCOUNTER — Encounter: Payer: Self-pay | Admitting: Physician Assistant

## 2017-07-09 VITALS — BP 170/78 | HR 80 | Resp 98 | Ht 66.0 in | Wt 148.2 lb

## 2017-07-09 DIAGNOSIS — E785 Hyperlipidemia, unspecified: Secondary | ICD-10-CM

## 2017-07-09 DIAGNOSIS — I10 Essential (primary) hypertension: Secondary | ICD-10-CM

## 2017-07-09 DIAGNOSIS — R7303 Prediabetes: Secondary | ICD-10-CM

## 2017-07-09 DIAGNOSIS — I739 Peripheral vascular disease, unspecified: Secondary | ICD-10-CM | POA: Diagnosis not present

## 2017-07-09 DIAGNOSIS — Z950 Presence of cardiac pacemaker: Secondary | ICD-10-CM | POA: Diagnosis not present

## 2017-07-09 NOTE — Patient Instructions (Addendum)
Medication Instructions:  Your physician recommends that you continue on your current medications as directed. Please refer to the Current Medication list given to you today. If you need a refill on your cardiac medications before your next appointment, please call your pharmacy.  Testing/Procedures: LE ARTERIAL 07-16-2017 @10AM  HER AT OUR OFFICE  Follow-Up: Your physician wants you to follow-up in: 6 MONTHS WITH DR [name]. You will receive a reminder letter in the mail two months in advance. If you don't receive a letter, please call our office November 2018 to schedule the January 2019 follow-up appointment.  Special Instructions: KEEP FOLLOW UP APPOINTMENT AS SCHEDULED WITH DR Gwenlyn Found 09-10-2017 @1015AM   CONTINUE TO MONITOR BP TWICE WEEKLY    Thank you for choosing [facility] at [facility]!!

## 2017-07-11 ENCOUNTER — Ambulatory Visit: Payer: Medicare HMO | Admitting: Physician Assistant

## 2017-07-14 LAB — CUP PACEART REMOTE DEVICE CHECK
Battery Remaining Longevity: 143 mo
Battery Voltage: 2.79 V
Brady Statistic AP VP Percent: 0 %
Brady Statistic AS VP Percent: 0 %
Implantable Lead Implant Date: 20150805
Implantable Lead Model: 5076
Implantable Pulse Generator Implant Date: 20150804
Lead Channel Impedance Value: 482 Ohm
Lead Channel Pacing Threshold Amplitude: 0.625 V
Lead Channel Pacing Threshold Amplitude: 0.875 V
Lead Channel Setting Pacing Amplitude: 1.75 V
Lead Channel Setting Pacing Pulse Width: 0.4 ms
MDC IDC LEAD IMPLANT DT: 20150805
MDC IDC LEAD LOCATION: 753859
MDC IDC LEAD LOCATION: 753860
MDC IDC MSMT BATTERY IMPEDANCE: 180 Ohm
MDC IDC MSMT LEADCHNL RA PACING THRESHOLD PULSEWIDTH: 0.4 ms
MDC IDC MSMT LEADCHNL RV IMPEDANCE VALUE: 574 Ohm
MDC IDC MSMT LEADCHNL RV PACING THRESHOLD PULSEWIDTH: 0.4 ms
MDC IDC SESS DTM: 20180711003914
MDC IDC SET LEADCHNL RV PACING AMPLITUDE: 2 V
MDC IDC SET LEADCHNL RV SENSING SENSITIVITY: 5.6 mV
MDC IDC STAT BRADY AP VS PERCENT: 44 %
MDC IDC STAT BRADY AS VS PERCENT: 56 %

## 2017-07-16 ENCOUNTER — Ambulatory Visit (HOSPITAL_COMMUNITY): Admission: RE | Admit: 2017-07-16 | Payer: Medicare HMO | Source: Ambulatory Visit

## 2017-07-16 ENCOUNTER — Other Ambulatory Visit: Payer: Self-pay | Admitting: Cardiovascular Disease

## 2017-07-16 DIAGNOSIS — I739 Peripheral vascular disease, unspecified: Secondary | ICD-10-CM

## 2017-08-04 ENCOUNTER — Ambulatory Visit (HOSPITAL_COMMUNITY)
Admission: RE | Admit: 2017-08-04 | Payer: Medicare HMO | Source: Ambulatory Visit | Attending: Cardiovascular Disease | Admitting: Cardiovascular Disease

## 2017-09-10 ENCOUNTER — Ambulatory Visit: Payer: Medicare HMO | Admitting: Cardiovascular Disease

## 2017-09-24 ENCOUNTER — Encounter (HOSPITAL_COMMUNITY): Payer: Medicare HMO

## 2017-09-25 ENCOUNTER — Other Ambulatory Visit: Payer: Self-pay | Admitting: Cardiovascular Disease

## 2017-09-25 DIAGNOSIS — I739 Peripheral vascular disease, unspecified: Secondary | ICD-10-CM

## 2017-10-02 ENCOUNTER — Ambulatory Visit (INDEPENDENT_AMBULATORY_CARE_PROVIDER_SITE_OTHER): Payer: Medicare HMO | Admitting: *Deleted

## 2017-10-02 ENCOUNTER — Telehealth: Payer: Self-pay | Admitting: Cardiology

## 2017-10-02 DIAGNOSIS — I495 Sick sinus syndrome: Secondary | ICD-10-CM | POA: Diagnosis not present

## 2017-10-02 NOTE — Telephone Encounter (Signed)
Spoke with pt and reminded pt of remote transmission that is due today. Pt verbalized understanding.   

## 2017-10-03 ENCOUNTER — Encounter: Payer: Self-pay | Admitting: Cardiology

## 2017-10-03 ENCOUNTER — Ambulatory Visit (HOSPITAL_COMMUNITY)
Admission: RE | Admit: 2017-10-03 | Payer: Medicare HMO | Source: Ambulatory Visit | Attending: Cardiovascular Disease | Admitting: Cardiovascular Disease

## 2017-10-03 NOTE — Progress Notes (Signed)
Remote pacemaker transmission.   

## 2017-10-06 NOTE — Progress Notes (Signed)
Cardiology Office Note Date:  10/08/2017  Patient ID:  Veronica Young, Veronica Young [birth year], MRN [id number] PCP:  Katherina Mires, MD  Cardiologist:  Dr. [name] Electrophysiologist: Dr. Lovena Le Vascular: Dr. Gwenlyn Found    Chief Complaint:  routine device visit  History of Present Illness: Veronica Young is a 83 y.o. female with history of malignant HTN, DM, HLD, PVD (known occl popliteal arteries), SSSx w/PPM, chronic CHF (diastolic), deaf, PAT.  She comes today to be seen for Dr. Lovena Le, last seen by him inOct 2017, no changes were made to her therapy at that time.  She is doing well, the patient denies any symptoms sonce her visit to the ER in June which she has already had f/u with cardiology service for. No recurrent CP, palpitations, she has not felt the ned to use the extra PRN lopressor dose.  No SOB, no dizziness, near syncope or syncope are reported.    Interpretor: Britt Bolognese w/[employer]ication [employer]  Device information: MDT dual chamber PPM implanted 07/27/14, Dr. Lovena Le   Past Medical History:  Diagnosis Date  . Carotid bruit   . CHF (congestive heart failure) ([place])   . Deafness   . Diabetes mellitus    type 2  . Diastolic heart failure (HCC)    EF is 60%  . GERD (gastroesophageal reflux disease)   . Glaucoma   . Hypercholesterolemia   . Hypertension   . Hypothyroidism   . Normal cardiac stress test January 2014  . PAD (peripheral artery disease) ([place])    a. known bilateral popliteal occlusions with conservative management favored.  [name] PAT (paroxysmal atrial tachycardia) ([place])   . Sick sinus syndrome [place])    a. s/p PPM placement 07/2014    Past Surgical History:  Procedure Laterality Date  . cholycystectomy    . left breast cyst removed    . PACEMAKER INSERTION  07-26-2014   MDT ADDRL1 pacemaker implanted by Dr Lovena Le for SSS  . PERMANENT PACEMAKER INSERTION N/A 07/26/2014   Procedure: PERMANENT PACEMAKER INSERTION;  Surgeon: Evans Lance, MD;  Location[facility]  CATH LAB;  Service: Cardiovascular;  Laterality: N/A;  . TOTAL KNEE ARTHROPLASTY     right    Current Outpatient Prescriptions  Medication Sig Dispense Refill  . aspirin EC 81 MG tablet Take 81 mg by mouth at bedtime.     [name] atorvastatin (LIPITOR) 80 MG tablet Take 80 mg by mouth at bedtime.     [name] atropine 1 % ophthalmic solution Place 1 drop into the left eye at bedtime.     . brimonidine (ALPHAGAN) 0.2 % ophthalmic solution Place 1 drop into both eyes daily.    . calcium-vitamin D (OSCAL WITH D) 500-200 MG-UNIT per tablet Take 1 tablet by mouth daily with breakfast.    . cholecalciferol (VITAMIN D) 1000 UNITS tablet Take 1,000 Units by mouth daily.    . cilostazol (PLETAL) 100 MG tablet Take 1 tablet (100 mg total) by mouth 2 (two) times daily. 60 tablet 6  . citalopram (CELEXA) 20 MG tablet Take 20 mg by mouth at bedtime.     . dorzolamide-timolol (COSOPT) 22.3-6.8 MG/ML ophthalmic solution Place 1 drop into both eyes 2 (two) times daily.     . hydrALAZINE (APRESOLINE) 25 MG tablet Take 1.5 tablets (37.5 mg total) by mouth 3 (three) times daily. 135 tablet 11  . latanoprost (XALATAN) 0.005 % ophthalmic solution Place 1 drop into both eyes daily.  12  . levothyroxine (SYNTHROID, LEVOTHROID) 112 MCG  tablet Take 112 mcg by mouth daily before breakfast.     . lisinopril (PRINIVIL,ZESTRIL) 40 MG tablet Take 1 tablet (40 mg total) by mouth daily. 90 tablet 1  . metoprolol tartrate (LOPRESSOR) 25 MG tablet Take 1 tablet (25 mg total) by mouth 2 (two) times daily. Take extra 25mg  ( 1 tablet) for palpitations as needed. 75 tablet 6  . potassium chloride (K-DUR,KLOR-CON) 10 MEQ tablet Take 10 mEq by mouth daily.     No current facility-administered medications for this visit.     Allergies:   Amlodipine and Benicar [olmesartan medoxomil]   Social History:  The patient  reports that she has never smoked. She has never used smokeless tobacco. She reports that she does not drink alcohol or use  drugs.   Family History:  The patient's family history includes Heart disease in her brother, mother, and sister; Liver cancer in her father; Stroke in her sister.  ROS:  Please see the history of present illness.  All other systems are reviewed and otherwise negative.   PHYSICAL EXAM:  VS:  BP (!) 136/54   Pulse 79   Ht 5\' 6"  (1.676 m)   Wt 145 lb (65.8 kg)   BMI 23.40 kg/m  BMI: Body mass index is 23.4 kg/m. Well nourished, well developed, in no acute distress  HEENT: normocephalic, atraumatic  Neck: no JVD, carotid bruits or masses Cardiac:  RRR; no significant murmurs, no rubs, or gallops Lungs:  CTA b/l, no wheezing, rhonchi or rales  Abd: soft, nontender MS: no deformity, age appropriate atrophy Ext:  no edema  Skin: warm and dry, no rash Neuro:  No gross deficits appreciated Psych: euthymic mood, full affect  PPM site is stable, no tethering or discomfort   EKG:   Not done today PPM interrogation done today and reviewed by myself: battery and lead measurements are good, PAT in June 50 minutes noted, since then one other episode 36 seconds only, lead outputs were at  2:1 safety margin, though were increased/programmed to clinic standards AP 57.3%, VP 0.5%  Echo 06/17/2017 LV EF: 65% - 70% Study Conclusions - Left ventricle: The cavity size was normal. Wall thickness was normal. Systolic function was vigorous. The estimated ejection fraction was in the range of 65% to 70%. Wall motion was normal; there were no regional wall motion abnormalities. Doppler parameters are consistent with abnormal left ventricular relaxation (grade 1 diastolic dysfunction). The E/e&' ratio is >15, suggesting elevated LV filling pressure. - Mitral valve: Mildly thickened leaflets . There was mild regurgitation. - Left atrium: The atrium was normal in size. - Right ventricle: The cavity size was normal. Wall thickness was normal. The moderator band was prominent. Pacer  wire or catheter noted in right ventricle. Systolic function was normal. - Right atrium: The atrium was normal in size. Pacer wire or catheter noted in right atrium. - Tricuspid valve: There was moderate regurgitation. - Pulmonary arteries: PA peak pressure: 51 mm Hg (S). - Inferior vena cava: The vessel was dilated. The respirophasic diameter changes were blunted (<50%), consistent with elevated central venous pressure. Impressions: - Compared to a prior study in 2015, there are few changes. LVEF is stable, RVSP is stable - the degree of diastolic dysfunction is less, however, LV filling pressure is elevated. Pacer wires now noted in the right heart.  01/12/13: Stress myoview Impression Exercise Capacity:  Lexiscan with no exercise. BP Response:  Hypertensive blood pressure response. Clinical Symptoms:  There is chest tightness. ECG Impression:  Significant ST abnormalities consistent with ischemia. Comparison with Prior Nuclear Study: No images to compare Overall Impression:  Low risk stress nuclear study with ECG changes; however no ischemia or infarction on perfusion images. LV Ejection Fraction: Study not gated.  LV Wall Motion:  Study not gated   Recent Labs: 06/17/2017: ALT 14; B Natriuretic Peptide 94.2; BUN 18; Creatinine, Ser 1.15; Hemoglobin 9.0; Magnesium 1.9; Platelets 166; Potassium 4.1; Sodium 134; TSH 3.889  06/17/2017: Cholesterol 179; HDL 45; LDL Cholesterol 127; Total CHOL/HDL Ratio 4.0; Triglycerides 33; VLDL 7   CrCl cannot be calculated (Patient's most recent lab result is older than the maximum 21 days allowed.).   Wt Readings from Last 3 Encounters:  10/08/17 145 lb (65.8 kg)  07/09/17 148 lb 3.2 oz (67.2 kg)  06/24/17 146 lb (66.2 kg)     Other studies reviewed: Additional studies/records reviewed today include: summarized above  ASSESSMENT AND PLAN:  1. PPM     Intact function  2.  HTN     Looks good today, no changes made  3.  PAT     No significant episodes since June that was known     Re-discussed able to take an additional dose of her metoprolol for palpitations   Disposition: F/u with Q 3 month remote device checks and EP clinic visit in 1 year, sooner if needed.  She is due to see Dr. [name] in January.  Current medicines are reviewed at length with the patient today.  The patient did not have any concerns regarding medicines.  Venetia Night, PA-C 10/08/2017 10:49 AM     [facility] [address] [place] [address] [phone] (office)  [phone] (fax)

## 2017-10-08 ENCOUNTER — Ambulatory Visit (INDEPENDENT_AMBULATORY_CARE_PROVIDER_SITE_OTHER): Payer: Medicare HMO | Admitting: Physician Assistant

## 2017-10-08 VITALS — BP 136/54 | HR 79 | Ht 66.0 in | Wt 145.0 lb

## 2017-10-08 DIAGNOSIS — I10 Essential (primary) hypertension: Secondary | ICD-10-CM | POA: Diagnosis not present

## 2017-10-08 DIAGNOSIS — Z950 Presence of cardiac pacemaker: Secondary | ICD-10-CM | POA: Diagnosis not present

## 2017-10-08 DIAGNOSIS — I471 Supraventricular tachycardia: Secondary | ICD-10-CM | POA: Diagnosis not present

## 2017-10-08 NOTE — Patient Instructions (Addendum)
Medication Instructions:   Your physician recommends that you continue on your current medications as directed. Please refer to the Current Medication list given to you today.   If you need a refill on your cardiac medications before your next appointment, please call your pharmacy.  Labwork: NONE ORDERED  TODAY    Testing/Procedures: NONE ORDERED  TODAY    Follow-Up:   WITH DR [name]  IN January    Any Other Special Instructions Will Be Listed Below (If Applicable).

## 2017-10-13 LAB — CUP PACEART REMOTE DEVICE CHECK
Battery Remaining Longevity: 141 mo
Battery Voltage: 2.79 V
Brady Statistic AS VP Percent: 0 %
Date Time Interrogation Session: 20181012011636
Implantable Lead Implant Date: 20150805
Implantable Lead Location: 753860
Implantable Lead Model: 5076
Implantable Pulse Generator Implant Date: 20150804
Lead Channel Impedance Value: 469 Ohm
Lead Channel Pacing Threshold Amplitude: 0.625 V
Lead Channel Pacing Threshold Amplitude: 0.875 V
Lead Channel Pacing Threshold Pulse Width: 0.4 ms
Lead Channel Setting Pacing Amplitude: 1.75 V
Lead Channel Setting Pacing Pulse Width: 0.4 ms
Lead Channel Setting Sensing Sensitivity: 5.6 mV
MDC IDC LEAD IMPLANT DT: 20150805
MDC IDC LEAD LOCATION: 753859
MDC IDC MSMT BATTERY IMPEDANCE: 180 Ohm
MDC IDC MSMT LEADCHNL RA PACING THRESHOLD PULSEWIDTH: 0.4 ms
MDC IDC MSMT LEADCHNL RV IMPEDANCE VALUE: 535 Ohm
MDC IDC SET LEADCHNL RV PACING AMPLITUDE: 2 V
MDC IDC STAT BRADY AP VP PERCENT: 0 %
MDC IDC STAT BRADY AP VS PERCENT: 57 %
MDC IDC STAT BRADY AS VS PERCENT: 43 %

## 2017-10-15 ENCOUNTER — Ambulatory Visit (HOSPITAL_COMMUNITY)
Admission: RE | Admit: 2017-10-15 | Discharge: 2017-10-15 | Disposition: A | Discharge status: Home / Self Care | Payer: Medicare HMO | Source: Ambulatory Visit | Attending: Cardiology | Admitting: Cardiology

## 2017-10-15 DIAGNOSIS — I739 Peripheral vascular disease, unspecified: Secondary | ICD-10-CM | POA: Diagnosis not present

## 2017-10-15 DIAGNOSIS — I70203 Unspecified atherosclerosis of native arteries of extremities, bilateral legs: Secondary | ICD-10-CM | POA: Diagnosis not present

## 2017-10-20 ENCOUNTER — Telehealth: Payer: Self-pay | Admitting: Cardiology

## 2017-10-20 NOTE — Telephone Encounter (Signed)
New message   Pt daughter [name] verbalized that she is trying to get results for her vascular

## 2017-10-20 NOTE — Telephone Encounter (Signed)
Notes recorded by Lorretta Harp, MD on 10/18/2017 at 1:28 PM EDT Return office visit with me to discuss results at next available

## 2017-10-20 NOTE — Telephone Encounter (Signed)
Pt's daughter notified, appt scheduled 11-11-2017

## 2017-11-11 ENCOUNTER — Ambulatory Visit (INDEPENDENT_AMBULATORY_CARE_PROVIDER_SITE_OTHER): Payer: Medicare HMO | Admitting: Cardiovascular Disease

## 2017-11-11 ENCOUNTER — Encounter: Payer: Self-pay | Admitting: Cardiovascular Disease

## 2017-11-11 VITALS — BP 157/74 | HR 90 | Ht 65.0 in | Wt 141.8 lb

## 2017-11-11 DIAGNOSIS — I739 Peripheral vascular disease, unspecified: Secondary | ICD-10-CM

## 2017-11-11 NOTE — Progress Notes (Signed)
History of claudication with Dopplers performed 10/15/17 revealing occluded popliteal arteries bilaterally. She does complain of some claudication although she has not ambulate out of the house much. She also complains of right knee and hip pain as well. She failed maximum doses of Pletal therapy. I'm going to get aortoiliac Dopplers to make sure she doesn't have an iliac lesion especially on the right side although I think her symptoms sound more orthopedic and/or neurologic. I do not think she is a candidate for endovascular therapy given her age, relative lack of mobility and location of her obstruction. I will see her back as needed.  Lorretta Harp, M.D., [place], [facility], [place] [address] [place], [place]  [address]  [phone] 11/11/2017 11:37 AM

## 2017-11-11 NOTE — Assessment & Plan Note (Signed)
History of claudication with Dopplers performed 10/15/17 revealing occluded popliteal arteries bilaterally. She does complain of some claudication although she has not ambulate out of the house much. She also complains of right knee and hip pain as well. She failed maximum doses of Pletal therapy. I'm going to get aortoiliac Dopplers to make sure she doesn't have an iliac lesion especially on the right side although I think her symptoms sound more orthopedic and/or neurologic. I do not think she is a candidate for endovascular therapy given her age, relative lack of mobility and location of her obstruction. I will see her back as needed.

## 2017-11-11 NOTE — Patient Instructions (Addendum)
Medication Instructions: Your physician recommends that you continue on your current medications as directed. Please refer to the Current Me  Testing/Procedures: Your physician has requested that you have a aorta and iliac duplex. During this test, an ultrasound is used to evaluate blood flow to the aorta and iliac arteries. Allow one hour for this exam. Do not eat after midnight the day before and avoid carbonated beverages.    Follow-Up: Your physician recommends that you schedule a follow-up appointment as needed with Dr. Gwenlyn Found.    [facility] [address] [place] [address]

## 2017-11-24 ENCOUNTER — Other Ambulatory Visit: Payer: Self-pay | Admitting: Cardiology

## 2017-12-18 ENCOUNTER — Other Ambulatory Visit: Payer: Self-pay

## 2017-12-18 MED ORDER — METOPROLOL TARTRATE 25 MG PO TABS: 25.0000 mg | ORAL_TABLET | Freq: Two times a day (BID) | ORAL | 6 refills | Status: DC

## 2017-12-26 ENCOUNTER — Ambulatory Visit (HOSPITAL_COMMUNITY): Admission: RE | Admit: 2017-12-26 | Payer: Medicare HMO | Source: Ambulatory Visit

## 2018-01-01 ENCOUNTER — Telehealth: Payer: Self-pay | Admitting: Cardiology

## 2018-01-01 ENCOUNTER — Ambulatory Visit (INDEPENDENT_AMBULATORY_CARE_PROVIDER_SITE_OTHER): Payer: Medicare HMO | Admitting: *Deleted

## 2018-01-01 DIAGNOSIS — I495 Sick sinus syndrome: Secondary | ICD-10-CM

## 2018-01-01 NOTE — Telephone Encounter (Signed)
LMOVM reminding pt to send remote transmission.   

## 2018-01-06 NOTE — Progress Notes (Signed)
Remote pacemaker transmission.   

## 2018-01-09 ENCOUNTER — Encounter: Payer: Self-pay | Admitting: Cardiology

## 2018-01-12 NOTE — Progress Notes (Signed)
Cardiology Office Note    Date:  01/16/2018   ID:  KASHMIR LEEDY, DOB February 28, 1933, MRN [id number]  PCP:  Katherina Mires, MD  Cardiologist:   Dr.Jordan Sullivan specialist: Dr. Gwenlyn Found Electrophysiologist: Dr. Lovena Le  Chief Complaint  Patient presents with  . Coronary Artery Disease  . Hypertension  . PAD    History of Present Illness:  Veronica Young is a 85 y.o. female with PMH of HTN, HLD, DM II, PAD with known occluded popliteal arteries, SSS s/p PPM 0/35 and diastolic heart failure with normal EF. She is deaf and require interpreter to communicate. Her recent ABI on 05/30/2017 showed right ABI 0.46, left ABI 0.50, left TBI 0.27. She was admitted on 06/16/2017 with chest pain. This occurred in the setting of atrial tachycardia with heart rate of 150s. Thyroid and electrolyte were normal. Serial troponin was negative. Fasting lipid panel showed LDL of 125. Hemoglobin was 8.6. Echocardiogram obtained on 08/17/2017 showed EF 59-74%, grade 1 diastolic dysfunction, moderate TR, mild MR, PA peak pressure 51 mmHg. She was subsequently discharged with  an additional 25 mg metoprolol on as needed basis for recurrent episode.  She was seen by Almyra Deforest PA-C  06/24/2017, she was no longer having chest pain. However her blood pressure was quite elevated with systolic blood pressure of 200. Her hydralazine was increased.   She was seen by Dr. Gwenlyn Found in October. LE arterial dopplers showed bilateral popliteal occlusions. He recommended aorto-iliac dopplers and while ordered these have not been done.  She reports she had a severe bout of the flu at the end of December that was "awful" and lasted about 2.5 weeks. She has fully recovered now. This was why she had her doppler studies were postponed and are now scheduled for these on 1/29.   She denies any chest pain. She has infrequent dyspnea at night. No increased swelling. Still has claudication symptoms R>L but thinks this hasn't changed. She does do some  walking mostly in her house. She reports BP at home 163-845 systolic.   Interview was conducted via Sales executive   Past Medical History:  Diagnosis Date  . Carotid bruit   . CHF (congestive heart failure) ([place])   . Deafness   . Diabetes mellitus    type 2  . Diastolic heart failure (HCC)    EF is 60%  . GERD (gastroesophageal reflux disease)   . Glaucoma   . Hypercholesterolemia   . Hypertension   . Hypothyroidism   . Normal cardiac stress test January 2014  . PAD (peripheral artery disease) ([place])    a. known bilateral popliteal occlusions with conservative management favored.  [name] PAT (paroxysmal atrial tachycardia) ([place])   . Sick sinus syndrome [facility])    a. s/p PPM placement 07/2014    Past Surgical History:  Procedure Laterality Date  . cholycystectomy    . left breast cyst removed    . PACEMAKER INSERTION  07-26-2014   MDT ADDRL1 pacemaker implanted by Dr Lovena Le for SSS  . PERMANENT PACEMAKER INSERTION N/A 07/26/2014   Procedure: PERMANENT PACEMAKER INSERTION;  Surgeon: Evans Lance, MD;  Location: [facility] CATH LAB;  Service: Cardiovascular;  Laterality: N/A;  . TOTAL KNEE ARTHROPLASTY     right    Current Medications: Outpatient Medications Prior to Visit  Medication Sig Dispense Refill  . aspirin EC 81 MG tablet Take 81 mg by mouth at bedtime.     [name] atorvastatin (LIPITOR) 80 MG tablet Take 80  mg by mouth at bedtime.     [name] atropine 1 % ophthalmic solution Place 1 drop into the left eye at bedtime.     . brimonidine (ALPHAGAN) 0.2 % ophthalmic solution Place 1 drop into both eyes daily.    . calcium-vitamin D (OSCAL WITH D) 500-200 MG-UNIT per tablet Take 1 tablet by mouth daily with breakfast.    . cholecalciferol (VITAMIN D) 1000 UNITS tablet Take 1,000 Units by mouth daily.    . cilostazol (PLETAL) 100 MG tablet Take 1 tablet (100 mg total) by mouth 2 (two) times daily. 60 tablet 6  . citalopram (CELEXA) 20 MG tablet Take 20 mg by mouth at bedtime.     .  dorzolamide-timolol (COSOPT) 22.3-6.8 MG/ML ophthalmic solution Place 1 drop into both eyes 2 (two) times daily.     . hydrALAZINE (APRESOLINE) 25 MG tablet Take 1.5 tablets (37.5 mg total) by mouth 3 (three) times daily. 135 tablet 11  . latanoprost (XALATAN) 0.005 % ophthalmic solution Place 1 drop into both eyes daily.  12  . levothyroxine (SYNTHROID, LEVOTHROID) 112 MCG tablet Take 112 mcg by mouth daily before breakfast.     . lisinopril (PRINIVIL,ZESTRIL) 40 MG tablet TAKE 1 TABLET (40 MG TOTAL) BY MOUTH DAILY. 90 tablet 1  . metoprolol tartrate (LOPRESSOR) 25 MG tablet Take 1 tablet (25 mg total) by mouth 2 (two) times daily. Take extra 25mg  ( 1 tablet) for palpitations as needed. 75 tablet 6  . potassium chloride (K-DUR,KLOR-CON) 10 MEQ tablet Take 10 mEq by mouth daily.     No facility-administered medications prior to visit.      Allergies:   Amlodipine and Benicar [olmesartan medoxomil]   Social History   Socioeconomic History  . Marital status: Widowed    Spouse name: None  . Number of children: 4  . Years of education: None  . Highest education level: None  Social Needs  . Financial resource strain: None  . Food insecurity - worry: None  . Food insecurity - inability: None  . Transportation needs - medical: None  . Transportation needs - non-medical: None  Occupational History  . None  Tobacco Use  . Smoking status: Never Smoker  . Smokeless tobacco: Never Used  Substance and Sexual Activity  . Alcohol use: No  . Drug use: No  . Sexual activity: No  Other Topics Concern  . None  Social History Narrative  . None     Family History:  The patient's family history includes Heart disease in her brother, mother, and sister; Liver cancer in her father; Stroke in her sister.   ROS:   Please see the history of present illness.    ROS All other systems reviewed and are negative.   PHYSICAL EXAM:   VS:  BP (!) 194/82   Pulse 78   Ht 5\' 5"  (1.651 m)   Wt 139 lb  (63 kg)   BMI 23.13 kg/m    GENERAL:  Well appearing HEENT:  PERRL, EOMI, sclera are clear. Oropharynx is clear. NECK:  No jugular venous distention, carotid upstroke brisk and symmetric, no bruits, no thyromegaly or adenopathy LUNGS:  Clear to auscultation bilaterally CHEST:  Unremarkable HEART:  RRR,  PMI not displaced or sustained,S1 and S2 within normal limits, no S3, no S4: no clicks, no rubs, no murmurs ABD:  Soft, nontender. BS +, no masses or bruits. No hepatomegaly, no splenomegaly EXT:  Reduced pedal pulses, tr edema, no cyanosis no clubbing SKIN:  Warm  and dry.  No rashes NEURO:  Alert and oriented x 3. Cranial nerves II through XII intact. PSYCH:  Cognitively intact    Wt Readings from Last 3 Encounters:  01/16/18 139 lb (63 kg)  11/11/17 141 lb 12.8 oz (64.3 kg)  10/08/17 145 lb (65.8 kg)      Studies/Labs Reviewed:   EKG:  EKG is not ordered today.   Recent Labs: 06/17/2017: ALT 14; B Natriuretic Peptide 94.2; BUN 18; Creatinine, Ser 1.15; Hemoglobin 9.0; Magnesium 1.9; Platelets 166; Potassium 4.1; Sodium 134; TSH 3.889   Lipid Panel    Component Value Date/Time   CHOL 179 06/17/2017 0506   TRIG 33 06/17/2017 0506   HDL 45 06/17/2017 0506   CHOLHDL 4.0 06/17/2017 0506   VLDL 7 06/17/2017 0506   LDLCALC 127 (H) 06/17/2017 0506    Additional studies/ records that were reviewed today include:    Echo 06/17/2017 LV EF: 65% - 70%  Study Conclusions  - Left ventricle: The cavity size was normal. Wall thickness was normal. Systolic function was vigorous. The estimated ejection fraction was in the range of 65% to 70%. Wall motion was normal; there were no regional wall motion abnormalities. Doppler parameters are consistent with abnormal left ventricular relaxation (grade 1 diastolic dysfunction). The E/e&' ratio is >15, suggesting elevated LV filling pressure. - Mitral valve: Mildly thickened leaflets . There was  mild regurgitation. - Left atrium: The atrium was normal in size. - Right ventricle: The cavity size was normal. Wall thickness was normal. The moderator band was prominent. Pacer wire or catheter noted in right ventricle. Systolic function was normal. - Right atrium: The atrium was normal in size. Pacer wire or catheter noted in right atrium. - Tricuspid valve: There was moderate regurgitation. - Pulmonary arteries: PA peak pressure: 51 mm Hg (S). - Inferior vena cava: The vessel was dilated. The respirophasic diameter changes were blunted (<50%), consistent with elevated central venous pressure.  Impressions:  - Compared to a prior study in 2015, there are few changes. LVEF is stable, RVSP is stable - the degree of diastolic dysfunction is less, however, LV filling pressure is elevated. Pacer wires now noted in the right heart.    ASSESSMENT:    1. Essential hypertension   2. Cardiac pacemaker in situ   3. Atrial tachycardia ([place])   4. Claudication ([place])   5. PAD (peripheral artery disease) (HCC)      PLAN:  In order of problems listed above:  1. Malignant hypertension: Blood pressure is elevated today but has been well controlled at home. Previously her home  blood pressure recorder was shown to be accurate. Will continue on the current medication.  2. Hyperlipidemia: On Lipitor 80 mg daily.   3. Prediabetes  4. Peripheral artery disease: bilateral popliteal occlusions. ? aorto-iliac disease. Plan Aorto-iliac and LE arterial dopplers on 01/20/17 then follow up with Dr. Gwenlyn Found.  5. SSS s/p pacemaker: Pacer check in October looked good.     Medication Adjustments/Labs and Tests Ordered: Current medicines are reviewed at length with the patient today.  Concerns regarding medicines are outlined above.  Medication changes, Labs and Tests ordered today are listed in the Patient Instructions below. Patient Instructions  Continue your current therapy  and get your ultrasound tests as planned.  We will see you in 6 months.      Signed, Peter [name], MD  01/16/2018 10:28 AM    [facility] [place], [place]  [address] Phone: [phone]; Fax: [phone]

## 2018-01-15 ENCOUNTER — Other Ambulatory Visit: Payer: Self-pay | Admitting: Cardiovascular Disease

## 2018-01-15 DIAGNOSIS — I739 Peripheral vascular disease, unspecified: Secondary | ICD-10-CM

## 2018-01-16 ENCOUNTER — Ambulatory Visit: Payer: Medicare HMO | Admitting: Cardiology

## 2018-01-16 ENCOUNTER — Encounter: Payer: Self-pay | Admitting: Cardiology

## 2018-01-16 VITALS — BP 194/82 | HR 78 | Ht 65.0 in | Wt 139.0 lb

## 2018-01-16 DIAGNOSIS — I471 Supraventricular tachycardia: Secondary | ICD-10-CM

## 2018-01-16 DIAGNOSIS — Z950 Presence of cardiac pacemaker: Secondary | ICD-10-CM

## 2018-01-16 DIAGNOSIS — I10 Essential (primary) hypertension: Secondary | ICD-10-CM | POA: Diagnosis not present

## 2018-01-16 DIAGNOSIS — I739 Peripheral vascular disease, unspecified: Secondary | ICD-10-CM

## 2018-01-16 DIAGNOSIS — I4719 Other supraventricular tachycardia: Secondary | ICD-10-CM

## 2018-01-16 NOTE — Patient Instructions (Signed)
Continue your current therapy and get your ultrasound tests as planned.  We will see you in 6 months.

## 2018-01-20 ENCOUNTER — Encounter (HOSPITAL_COMMUNITY): Payer: Self-pay

## 2018-01-20 ENCOUNTER — Ambulatory Visit (HOSPITAL_COMMUNITY)
Admission: RE | Admit: 2018-01-20 | Discharge: 2018-01-20 | Disposition: A | Discharge status: Home / Self Care | Payer: Medicare HMO | Source: Ambulatory Visit | Attending: Internal Medicine | Admitting: Internal Medicine

## 2018-01-20 ENCOUNTER — Ambulatory Visit (HOSPITAL_COMMUNITY)
Admission: RE | Admit: 2018-01-20 | Discharge: 2018-01-20 | Disposition: A | Discharge status: Home / Self Care | Payer: Medicare HMO | Source: Ambulatory Visit | Attending: Cardiovascular Disease | Admitting: Cardiovascular Disease

## 2018-01-20 DIAGNOSIS — I739 Peripheral vascular disease, unspecified: Secondary | ICD-10-CM | POA: Diagnosis present

## 2018-01-20 NOTE — Progress Notes (Unsigned)
Juliann Pulse was the sign language interpreter today.

## 2018-02-02 ENCOUNTER — Other Ambulatory Visit: Payer: Self-pay | Admitting: Cardiovascular Disease

## 2018-02-02 ENCOUNTER — Telehealth: Payer: Self-pay | Admitting: Cardiovascular Disease

## 2018-02-02 DIAGNOSIS — I739 Peripheral vascular disease, unspecified: Secondary | ICD-10-CM

## 2018-02-02 NOTE — Telephone Encounter (Signed)
Follow up     Daughter calling for doppler results

## 2018-02-03 LAB — CUP PACEART REMOTE DEVICE CHECK
Battery Impedance: 227 Ohm
Battery Remaining Longevity: 116 mo
Brady Statistic AP VP Percent: 1 %
Brady Statistic AP VS Percent: 80 %
Brady Statistic AS VP Percent: 0 %
Brady Statistic AS VS Percent: 20 %
Implantable Lead Implant Date: 20150805
Implantable Lead Location: 753859
Implantable Lead Model: 5076
Implantable Lead Model: 5076
Implantable Pulse Generator Implant Date: 20150804
Lead Channel Impedance Value: 456 Ohm
Lead Channel Impedance Value: 539 Ohm
Lead Channel Pacing Threshold Amplitude: 0.625 V
Lead Channel Pacing Threshold Amplitude: 0.625 V
Lead Channel Setting Pacing Amplitude: 2.5 V
Lead Channel Setting Sensing Sensitivity: 5.6 mV
MDC IDC LEAD IMPLANT DT: 20150805
MDC IDC LEAD LOCATION: 753860
MDC IDC MSMT BATTERY VOLTAGE: 2.79 V
MDC IDC MSMT LEADCHNL RA PACING THRESHOLD PULSEWIDTH: 0.4 ms
MDC IDC MSMT LEADCHNL RV PACING THRESHOLD PULSEWIDTH: 0.4 ms
MDC IDC SESS DTM: 20190112031324
MDC IDC SET LEADCHNL RA PACING AMPLITUDE: 2 V
MDC IDC SET LEADCHNL RV PACING PULSEWIDTH: 0.4 ms

## 2018-02-04 NOTE — Telephone Encounter (Signed)
Follow up     Patient daughter calling for doppler results. Please call

## 2018-02-04 NOTE — Telephone Encounter (Signed)
Patient's made aware of results (per patient request-she is deaf). She has verbalized her understanding.    Notes recorded by Lorretta Harp, MD on 01/21/2018 at 12:51 PM EST No change from prior study. Repeat in 12 months.

## 2018-03-16 ENCOUNTER — Telehealth: Payer: Self-pay

## 2018-03-16 NOTE — Telephone Encounter (Signed)
The patient is hearing impaired and uses "relay" to communicate by phone. She can't hear the phone ring, so my phone call today went to voicemail. I called and spoke with her daughter who will see the pt tonight. She will ask the patient to call our office tomorrow between 1:30 and 4:30 and ask for the preop provider using "relay."

## 2018-03-16 NOTE — Telephone Encounter (Signed)
Ok to hold ASA for 5 days for colonoscopy  Veronica Young [name] MD, [facility]

## 2018-03-16 NOTE — Telephone Encounter (Signed)
   [facility] Pre-operative Risk Assessment    Request for surgical clearance:  1. What type of surgery is being performed? Endoscopy and Colonoscopy   2. When is this surgery scheduled? Pending   3. What type of clearance is required (medical clearance vs. Pharmacy clearance to hold med vs. Both)? Medical  4. Are there any medications that need to be held prior to surgery and how long? Aspirin  5. Practice name and name of physician performing surgery? [facility]  Dr.Charles Katopes  6. What is your office phone and fax number? Phone # [phone]   Fax # [phone]  7. Anesthesia type (None, local, MAC, general) ? unkown   Kathyrn Lass 03/16/2018, 8:44 AM  _________________________________________________________________   (provider comments below)

## 2018-03-18 NOTE — Telephone Encounter (Signed)
   Primary Cardiologist:No primary care provider on file.  Chart reviewed as part of pre-operative protocol coverage.  Patient has a history of diabetes, peripheral arterial disease, diastolic heart failure, status post pacemaker.  Cardiac catheterization 2005 demonstrated moderate nonobstructive coronary disease.  Nuclear stress test in 2014 was negative for ischemia.  Echocardiogram June 2018 demonstrated normal ejection fraction.  She needs colonoscopy in the near future.  Chart has been reviewed per office protocol.  Her primary cardiologist notes that she may hold aspirin for 5 days prior to her procedure.  Patient is hearing impaired.  I tried calling her today but there was no answer.  She has been asked previously to contact our office.  We are still waiting for callback.  Richardson Dopp, PA-C  03/18/2018, 3:25 PM

## 2018-03-18 NOTE — Telephone Encounter (Signed)
Follow Up   Arbie Cookey at [facility] is calling to check on the surgical clearance she sent over. Please call

## 2018-03-19 NOTE — Telephone Encounter (Signed)
I tried to contact pt again.  Left message through hearing impaired service also contacted digestive service.

## 2018-03-19 NOTE — Telephone Encounter (Signed)
   Primary Cardiologist: Peter [name], MD  Chart reviewed as part of pre-operative protocol coverage. Given past medical history and time since last visit, based on ACC/AHA guidelines, IYSHA MISHKIN would be at acceptable risk for the planned procedure without further cardiovascular testing.   Ok to hold asprin 5 days prior to procedure.    I will route this recommendation to the requesting party via Epic fax function and remove from pre-op pool.  Please call with questions.  Cecilie Kicks, NP 03/19/2018, 3:25 PM

## 2018-04-02 ENCOUNTER — Encounter: Payer: Medicare HMO | Admitting: *Deleted

## 2018-04-02 ENCOUNTER — Telehealth: Payer: Self-pay | Admitting: Cardiology

## 2018-04-02 NOTE — Telephone Encounter (Signed)
Spoke with pt and reminded pt of remote transmission that is due today. Pt verbalized understanding.   

## 2018-04-08 ENCOUNTER — Encounter: Payer: Medicare HMO | Admitting: *Deleted

## 2018-06-10 ENCOUNTER — Other Ambulatory Visit: Payer: Self-pay | Admitting: Cardiology

## 2018-06-10 NOTE — Telephone Encounter (Signed)
Rx request sent to pharmacy.  

## 2018-07-08 ENCOUNTER — Telehealth: Payer: Self-pay | Admitting: Cardiovascular Disease

## 2018-07-08 NOTE — Telephone Encounter (Signed)
   Primary Cardiologist: Peter [name], MD  Chart reviewed as part of pre-operative protocol coverage. Given past medical history and time since last visit, based on ACC/AHA guidelines, Veronica Young would be at acceptable risk for the planned procedure without further cardiovascular testing.   OK to hold ASA 5 days pre op Petal 2 days pre op  I will route this recommendation to the requesting party via Epic fax function and remove from pre-op pool.  Please call with questions.  Kerin Ransom, PA-C 07/08/2018, 4:30 PM

## 2018-07-08 NOTE — Telephone Encounter (Signed)
   [facility] Pre-operative Risk Assessment    Request for surgical clearance:  1. What type of surgery is being performed? Transformational Epidural Steroid Injection (Right: L3-4, L4-5)   2. When is this surgery scheduled? TBD   3. What type of clearance is required (medical clearance vs. Pharmacy clearance to hold med vs. Both)? Pharmacy clearance   4. Are there any medications that need to be held prior to surgery and how long? Pletal, for 2 days   5. Practice name and name of physician performing surgery? [facility], Dorene Ar, MD   6. What is your office phone number [phone]    7.   What is your office fax number [phone]  8.   Anesthesia type (None, local, MAC, general) ? Unknown    Veronica Young 07/08/2018, 1:33 PM  _________________________________________________________________   (provider comments below)

## 2018-07-13 ENCOUNTER — Telehealth: Payer: Self-pay

## 2018-07-13 ENCOUNTER — Encounter: Payer: Medicare HMO | Admitting: *Deleted

## 2018-07-13 NOTE — Telephone Encounter (Signed)
I spoke with the patient about this.

## 2018-07-15 ENCOUNTER — Encounter: Payer: Self-pay | Admitting: Cardiology

## 2018-07-21 ENCOUNTER — Ambulatory Visit (INDEPENDENT_AMBULATORY_CARE_PROVIDER_SITE_OTHER): Payer: Medicare HMO | Admitting: *Deleted

## 2018-07-21 DIAGNOSIS — I495 Sick sinus syndrome: Secondary | ICD-10-CM | POA: Diagnosis not present

## 2018-07-22 ENCOUNTER — Encounter: Payer: Self-pay | Admitting: Cardiology

## 2018-07-22 NOTE — Progress Notes (Signed)
Remote pacemaker transmission.   

## 2018-07-25 LAB — CUP PACEART REMOTE DEVICE CHECK
Battery Voltage: 2.79 V
Brady Statistic AP VS Percent: 71 %
Brady Statistic AS VS Percent: 29 %
Date Time Interrogation Session: 20190731022227
Implantable Lead Implant Date: 20150805
Implantable Lead Implant Date: 20150805
Implantable Lead Location: 753860
Implantable Lead Model: 5076
Lead Channel Impedance Value: 527 Ohm
Lead Channel Pacing Threshold Amplitude: 0.75 V
Lead Channel Pacing Threshold Pulse Width: 0.4 ms
Lead Channel Pacing Threshold Pulse Width: 0.4 ms
Lead Channel Sensing Intrinsic Amplitude: 16 mV
Lead Channel Sensing Intrinsic Amplitude: 2.8 mV
Lead Channel Setting Pacing Amplitude: 2 V
MDC IDC LEAD LOCATION: 753859
MDC IDC MSMT BATTERY IMPEDANCE: 227 Ohm
MDC IDC MSMT BATTERY REMAINING LONGEVITY: 117 mo
MDC IDC MSMT LEADCHNL RA IMPEDANCE VALUE: 475 Ohm
MDC IDC MSMT LEADCHNL RV PACING THRESHOLD AMPLITUDE: 0.625 V
MDC IDC PG IMPLANT DT: 20150804
MDC IDC SET LEADCHNL RV PACING AMPLITUDE: 2.5 V
MDC IDC SET LEADCHNL RV PACING PULSEWIDTH: 0.4 ms
MDC IDC SET LEADCHNL RV SENSING SENSITIVITY: 5.6 mV
MDC IDC STAT BRADY AP VP PERCENT: 0 %
MDC IDC STAT BRADY AS VP PERCENT: 0 %

## 2018-09-03 ENCOUNTER — Telehealth: Payer: Self-pay

## 2018-09-03 NOTE — Telephone Encounter (Signed)
Dr. Wyline Beady office advised per [facility] That the pt does not have to hold any meds prior to her one tooth extraction today.

## 2018-10-16 ENCOUNTER — Other Ambulatory Visit: Payer: Self-pay | Admitting: *Deleted

## 2018-10-16 MED ORDER — LISINOPRIL 40 MG PO TABS: 40.0000 mg | ORAL_TABLET | Freq: Every day | ORAL | 0 refills | Status: DC

## 2018-10-20 ENCOUNTER — Encounter: Payer: Medicare HMO | Admitting: *Deleted

## 2018-10-20 ENCOUNTER — Telehealth: Payer: Self-pay | Admitting: Cardiology

## 2018-10-20 NOTE — Telephone Encounter (Signed)
Spoke with pt and reminded pt of remote transmission that is due today. Pt verbalized understanding.   

## 2018-10-21 ENCOUNTER — Encounter: Payer: Self-pay | Admitting: Cardiology

## 2018-10-27 ENCOUNTER — Ambulatory Visit (INDEPENDENT_AMBULATORY_CARE_PROVIDER_SITE_OTHER): Payer: Medicare HMO | Admitting: *Deleted

## 2018-10-27 DIAGNOSIS — I10 Essential (primary) hypertension: Secondary | ICD-10-CM

## 2018-10-27 DIAGNOSIS — I495 Sick sinus syndrome: Secondary | ICD-10-CM | POA: Diagnosis not present

## 2018-10-28 NOTE — Progress Notes (Signed)
Remote pacemaker transmission.   

## 2018-11-01 ENCOUNTER — Encounter: Payer: Self-pay | Admitting: Cardiology

## 2018-11-30 ENCOUNTER — Other Ambulatory Visit: Payer: Self-pay | Admitting: Cardiovascular Disease

## 2018-12-27 LAB — CUP PACEART REMOTE DEVICE CHECK
Battery Remaining Longevity: 114 mo
Brady Statistic AP VS Percent: 61 %
Brady Statistic AS VP Percent: 0 %
Brady Statistic AS VS Percent: 38 %
Date Time Interrogation Session: 20191105030934
Implantable Lead Implant Date: 20150805
Implantable Lead Location: 753860
Lead Channel Pacing Threshold Pulse Width: 0.4 ms
Lead Channel Setting Pacing Amplitude: 2 V
Lead Channel Setting Pacing Pulse Width: 0.4 ms
MDC IDC LEAD IMPLANT DT: 20150805
MDC IDC LEAD LOCATION: 753859
MDC IDC MSMT BATTERY IMPEDANCE: 251 Ohm
MDC IDC MSMT BATTERY VOLTAGE: 2.79 V
MDC IDC MSMT LEADCHNL RA IMPEDANCE VALUE: 450 Ohm
MDC IDC MSMT LEADCHNL RA PACING THRESHOLD AMPLITUDE: 0.75 V
MDC IDC MSMT LEADCHNL RA PACING THRESHOLD PULSEWIDTH: 0.4 ms
MDC IDC MSMT LEADCHNL RV IMPEDANCE VALUE: 496 Ohm
MDC IDC MSMT LEADCHNL RV PACING THRESHOLD AMPLITUDE: 0.875 V
MDC IDC PG IMPLANT DT: 20150804
MDC IDC SET LEADCHNL RV PACING AMPLITUDE: 2.5 V
MDC IDC SET LEADCHNL RV SENSING SENSITIVITY: 5.6 mV
MDC IDC STAT BRADY AP VP PERCENT: 0 %

## 2019-01-06 ENCOUNTER — Ambulatory Visit (HOSPITAL_COMMUNITY)
Admission: RE | Admit: 2019-01-06 | Discharge: 2019-01-06 | Disposition: A | Discharge status: Home / Self Care | Payer: Medicare HMO | Source: Ambulatory Visit | Attending: Cardiology | Admitting: Cardiology

## 2019-01-06 ENCOUNTER — Ambulatory Visit (HOSPITAL_BASED_OUTPATIENT_CLINIC_OR_DEPARTMENT_OTHER)
Admission: RE | Admit: 2019-01-06 | Discharge: 2019-01-06 | Disposition: A | Discharge status: Home / Self Care | Payer: Medicare HMO | Source: Ambulatory Visit | Attending: Cardiovascular Disease | Admitting: Cardiovascular Disease

## 2019-01-06 DIAGNOSIS — I739 Peripheral vascular disease, unspecified: Secondary | ICD-10-CM

## 2019-01-12 ENCOUNTER — Other Ambulatory Visit: Payer: Self-pay | Admitting: *Deleted

## 2019-01-12 DIAGNOSIS — I739 Peripheral vascular disease, unspecified: Secondary | ICD-10-CM

## 2019-02-11 ENCOUNTER — Other Ambulatory Visit: Payer: Self-pay | Admitting: Cardiology

## 2019-02-19 ENCOUNTER — Encounter: Payer: Self-pay | Admitting: Cardiology

## 2019-03-05 ENCOUNTER — Ambulatory Visit (INDEPENDENT_AMBULATORY_CARE_PROVIDER_SITE_OTHER): Payer: Medicare HMO | Admitting: *Deleted

## 2019-03-05 DIAGNOSIS — I495 Sick sinus syndrome: Secondary | ICD-10-CM | POA: Diagnosis not present

## 2019-03-06 LAB — CUP PACEART REMOTE DEVICE CHECK
Battery Remaining Longevity: 109 mo
Brady Statistic AP VP Percent: 0 %
Brady Statistic AP VS Percent: 61 %
Brady Statistic AS VP Percent: 0 %
Date Time Interrogation Session: 20200313012619
Implantable Lead Implant Date: 20150805
Implantable Lead Location: 753860
Implantable Lead Model: 5076
Implantable Lead Model: 5076
Lead Channel Impedance Value: 462 Ohm
Lead Channel Pacing Threshold Amplitude: 0.75 V
Lead Channel Pacing Threshold Amplitude: 0.875 V
Lead Channel Pacing Threshold Pulse Width: 0.4 ms
Lead Channel Pacing Threshold Pulse Width: 0.4 ms
MDC IDC LEAD IMPLANT DT: 20150805
MDC IDC LEAD LOCATION: 753859
MDC IDC MSMT BATTERY IMPEDANCE: 299 Ohm
MDC IDC MSMT BATTERY VOLTAGE: 2.79 V
MDC IDC MSMT LEADCHNL RV IMPEDANCE VALUE: 524 Ohm
MDC IDC PG IMPLANT DT: 20150804
MDC IDC SET LEADCHNL RA PACING AMPLITUDE: 2 V
MDC IDC SET LEADCHNL RV PACING AMPLITUDE: 2.5 V
MDC IDC SET LEADCHNL RV PACING PULSEWIDTH: 0.4 ms
MDC IDC SET LEADCHNL RV SENSING SENSITIVITY: 5.6 mV
MDC IDC STAT BRADY AS VS PERCENT: 38 %

## 2019-03-09 ENCOUNTER — Other Ambulatory Visit: Payer: Self-pay

## 2019-03-12 ENCOUNTER — Encounter: Payer: Self-pay | Admitting: Cardiology

## 2019-03-12 NOTE — Progress Notes (Signed)
Remote pacemaker transmission.   

## 2019-03-31 ENCOUNTER — Other Ambulatory Visit: Payer: Self-pay | Admitting: Cardiology

## 2019-03-31 ENCOUNTER — Telehealth: Payer: Self-pay

## 2019-03-31 NOTE — Telephone Encounter (Signed)
LM2CB schedule appt

## 2019-03-31 NOTE — Telephone Encounter (Signed)
LOV 12-2017 needs appt refill #90 PATIENT IS DEAF, PLZ CONTACT DAUGHTER ROSE SHOWN TO COMMUNICATE W/PT [phone]

## 2019-04-05 NOTE — Telephone Encounter (Signed)
Pt already has appt scheduled with dr [name] next month

## 2019-05-04 ENCOUNTER — Telehealth (INDEPENDENT_AMBULATORY_CARE_PROVIDER_SITE_OTHER): Payer: Medicare HMO | Admitting: Physician Assistant

## 2019-05-04 ENCOUNTER — Telehealth: Payer: Medicare HMO | Admitting: Cardiology

## 2019-05-04 VITALS — BP 149/73 | HR 67 | Ht 66.0 in | Wt 145.0 lb

## 2019-05-04 DIAGNOSIS — Z950 Presence of cardiac pacemaker: Secondary | ICD-10-CM

## 2019-05-04 DIAGNOSIS — I10 Essential (primary) hypertension: Secondary | ICD-10-CM | POA: Diagnosis not present

## 2019-05-04 DIAGNOSIS — I739 Peripheral vascular disease, unspecified: Secondary | ICD-10-CM

## 2019-05-04 DIAGNOSIS — E785 Hyperlipidemia, unspecified: Secondary | ICD-10-CM

## 2019-05-04 DIAGNOSIS — E119 Type 2 diabetes mellitus without complications: Secondary | ICD-10-CM

## 2019-05-04 DIAGNOSIS — I495 Sick sinus syndrome: Secondary | ICD-10-CM | POA: Diagnosis not present

## 2019-05-04 NOTE — Patient Instructions (Signed)
Your physician wants you to follow-up in: 6 MONTHS WITH DR [name] You will receive a reminder letter in the mail two months in advance. If you don't receive a letter, please call our office to schedule the follow-up appointment.  Your physician recommends that you continue on your current medications as directed. Please refer to the Current Medication list given to you today.  Thank you for choosing [facility]!!

## 2019-05-04 NOTE — Progress Notes (Signed)
Virtual Visit via Telephone Note   This visit type was conducted due to national recommendations for restrictions regarding the COVID-19 Pandemic (e.g. social distancing) in an effort to limit this patient's exposure and mitigate transmission in our community.  Due to her co-morbid illnesses, this patient is at least at moderate risk for complications without adequate follow up.  This format is felt to be most appropriate for this patient at this time.  The patient did not have access to video technology/had technical difficulties with video requiring transitioning to audio format only (telephone).  All issues noted in this document were discussed and addressed.  No physical exam could be performed with this format.  Please refer to the patient's chart for her  consent to telehealth for [facility].   Date:  05/04/2019   ID:  Veronica Young, DOB 03-29-1933, MRN [id number]  Patient Location: Home Provider Location: Home  PCP:  Katherina Mires, MD  Cardiologist:  Peter [name], MD  PV specialist: Dr. Gwenlyn Found Electrophysiologist: Dr. Lovena Le  Evaluation Performed:  Follow-Up Visit  Chief Complaint:  followup  History of Present Illness:    Veronica Young is a 85 y.o. female with with past medical history of hypertension, hyperlipidemia, DM 2, PAD with known occluded popliteal artery, sick sinus syndrome s/p PPM in August 1914 and diastolic heart failure with normal EF.  She is deaf and her daughter acted as Astronomer.  She was admitted in June 2018 with chest pain, this occurred in the setting of atrial tachycardia with heart rate of 150.  Thyroid level and electrolyte were normal.  Troponin was negative.  Hemoglobin was 8.6.  Echocardiogram obtained in August 2018 showed EF 65 to 70%, grade 1 DD, moderate TR and MR, PA peak pressure 51 mmHg.  I increased her hydralazine in 2018.  Her last appointment with Dr. [name] was in January 2019, at which time she was doing well.  Most recent ABI  obtained in January 2020 showed known occluded bilateral popliteal artery, no change when compared to the previous reading. Therefore 63-month repeat study was recommended.  Last device interrogation in March 2020 showed stable device function and battery life.  Patient was contacted today via telephone visit with her daughter acting as a Optometrist.  Her daughter has recently moved in with her and has been taking care of Veronica Young.  According to her daughter, Veronica Young has some good and bad days.  She has been doing well from the cardiology perspective.  Blood pressure is borderline high today however she is in a lot of pain in the back radiating to the right lower extremity.  She has been dealing with back pain for a while now and had injection in the back at [place], however this has not improved her back pain.  On her good days, she is able to drive around and see her friends, on the bad days, she is usually quite sedentary.  Overall she denies any chest pain or shortness of breath.  I think she is stable from cardiology perspective.  I did discuss with the daughter signs and symptoms of critical leg ischemia, she is aware that she needs to seek immediate medical attention if patient has pain out of proportion in the legs, pale skin discoloration, bluish discoloration or darkening of the skin.  The patient does not have symptoms concerning for COVID-19 infection (fever, chills, cough, or new shortness of breath).    Past Medical History:  Diagnosis Date  Carotid bruit    CHF (congestive heart failure) (HCC)    Deafness    Diabetes mellitus    type 2   Diastolic heart failure (HCC)    EF is 60%   GERD (gastroesophageal reflux disease)    Glaucoma    Hypercholesterolemia    Hypertension    Hypothyroidism    Normal cardiac stress test January 2014   PAD (peripheral artery disease) ([place])    a. known bilateral popliteal occlusions with conservative management favored.   PAT  (paroxysmal atrial tachycardia) (HCC)    Sick sinus syndrome [facility])    a. s/p PPM placement 07/2014   Past Surgical History:  Procedure Laterality Date   cholycystectomy     left breast cyst removed     PACEMAKER INSERTION  07-26-2014   MDT ADDRL1 pacemaker implanted by Dr Lovena Le for [place] N/A 07/26/2014   Procedure: PERMANENT PACEMAKER INSERTION;  Surgeon: Evans Lance, MD;  Location: [facility] CATH LAB;  Service: Cardiovascular;  Laterality: N/A;   TOTAL KNEE ARTHROPLASTY     right     Current Meds  Medication Sig   aspirin EC 81 MG tablet Take 81 mg by mouth at bedtime.    atorvastatin (LIPITOR) 80 MG tablet Take 80 mg by mouth at bedtime.    atropine 1 % ophthalmic solution Place 1 drop into the left eye at bedtime.    brimonidine (ALPHAGAN) 0.2 % ophthalmic solution Place 1 drop into both eyes daily.   calcium-vitamin D (OSCAL WITH D) 500-200 MG-UNIT per tablet Take 1 tablet by mouth daily with breakfast.   cholecalciferol (VITAMIN D) 1000 UNITS tablet Take 1,000 Units by mouth daily.   cilostazol (PLETAL) 50 MG tablet TAKE 1 TABLET TWICE DAILY   citalopram (CELEXA) 20 MG tablet Take 20 mg by mouth at bedtime.    dorzolamide-timolol (COSOPT) 22.3-6.8 MG/ML ophthalmic solution Place 1 drop into both eyes 2 (two) times daily.    hydrALAZINE (APRESOLINE) 50 MG tablet Take 50 mg by mouth 3 (three) times daily.   latanoprost (XALATAN) 0.005 % ophthalmic solution Place 1 drop into both eyes daily.   levothyroxine (SYNTHROID, LEVOTHROID) 112 MCG tablet Take 112 mcg by mouth daily before breakfast.    lisinopril (PRINIVIL,ZESTRIL) 40 MG tablet TAKE 1 TABLET EVERY DAY   metoprolol tartrate (LOPRESSOR) 25 MG tablet TAKE 1 TABLET BY MOUTH 2 TIMES DAILY. TAKE EXTRA 25MG  ( 1 TABLET) FOR PALPITATIONS AS NEEDED.   potassium chloride (K-DUR,KLOR-CON) 10 MEQ tablet Take 10 mEq by mouth daily.   [DISCONTINUED] cilostazol (PLETAL) 100 MG tablet Take 1  tablet (100 mg total) by mouth 2 (two) times daily.   [DISCONTINUED] hydrALAZINE (APRESOLINE) 25 MG tablet Take 1.5 tablets (37.5 mg total) by mouth 3 (three) times daily.     Allergies:   Amlodipine and Benicar [olmesartan medoxomil]   Social History   Tobacco Use   Smoking status: Never Smoker   Smokeless tobacco: Never Used  Substance Use Topics   Alcohol use: No   Drug use: No     Family Hx: The patient's family history includes Heart disease in her brother, mother, and sister; Liver cancer in her father; Stroke in her sister.  ROS:   Please see the history of present illness.     All other systems reviewed and are negative.   Prior CV studies:   The following studies were reviewed today:  Echo 06/17/2017 LV EF: 65% -   70%  -------------------------------------------------------------------  Indications:      Chest pain 786.51.  ------------------------------------------------------------------- History:   PMH:   Coronary artery disease.  Risk factors: Hypertension. Diabetes mellitus. Dyslipidemia.  ------------------------------------------------------------------- Study Conclusions  - Left ventricle: The cavity size was normal. Wall thickness was   normal. Systolic function was vigorous. The estimated ejection   fraction was in the range of 65% to 70%. Wall motion was normal;   there were no regional wall motion abnormalities. Doppler   parameters are consistent with abnormal left ventricular   relaxation (grade 1 diastolic dysfunction). The E/e&' ratio is   >15, suggesting elevated LV filling pressure. - Mitral valve: Mildly thickened leaflets . There was mild   regurgitation. - Left atrium: The atrium was normal in size. - Right ventricle: The cavity size was normal. Wall thickness was   normal. The moderator band was prominent. Pacer wire or catheter   noted in right ventricle. Systolic function was normal. - Right atrium: The atrium was normal  in size. Pacer wire or   catheter noted in right atrium. - Tricuspid valve: There was moderate regurgitation. - Pulmonary arteries: PA peak pressure: 51 mm Hg (S). - Inferior vena cava: The vessel was dilated. The respirophasic   diameter changes were blunted (< 50%), consistent with elevated   central venous pressure.  Impressions:  - Compared to a prior study in 2015, there are few changes. LVEF is   stable, RVSP is stable - the degree of diastolic dysfunction is   less, however, LV filling pressure is elevated. Pacer wires now   noted in the right heart.   Labs/Other Tests and Data Reviewed:    EKG:  An ECG dated 06/17/2017 was personally reviewed today and demonstrated:  Atrial paced rhythm.  Recent Labs: No results found for requested labs within last 8760 hours.   Recent Lipid Panel Lab Results  Component Value Date/Time   CHOL 179 06/17/2017 05:06 AM   TRIG 33 06/17/2017 05:06 AM   HDL 45 06/17/2017 05:06 AM   CHOLHDL 4.0 06/17/2017 05:06 AM   LDLCALC 127 (H) 06/17/2017 05:06 AM    Wt Readings from Last 3 Encounters:  05/04/19 145 lb (65.8 kg)  01/16/18 139 lb (63 kg)  11/11/17 141 lb 12.8 oz (64.3 kg)     Objective:    Vital Signs:  BP (!) 149/73    Pulse 67    Ht 5\' 6"  (1.676 m)    Wt 145 lb (65.8 kg)    BMI 23.40 kg/m    VITAL SIGNS:  reviewed  ASSESSMENT & PLAN:     1. Hypertension: Blood pressure borderline high today however she is having significant back pain.  2. Hyperlipidemia: Continue Lipitor 80 mg daily  3. DM2: Managed by primary care provider  4. PAD: Denies significant pain in the leg.  5. Sick sinus syndrome status post PPM in August 2015: Last device interrogation in March 2020 showed a stable device function.  COVID-19 Education: The signs and symptoms of COVID-19 were discussed with the patient and how to seek care for testing (follow up with PCP or arrange E-visit).  The importance of social distancing was discussed  today.  Time:   Today, I have spent 10 minutes with the patient with telehealth technology discussing the above problems.     Medication Adjustments/Labs and Tests Ordered: Current medicines are reviewed at length with the patient today.  Concerns regarding medicines are outlined above.   Tests Ordered: No orders of the defined types were  placed in this encounter.   Medication Changes: No orders of the defined types were placed in this encounter.   Disposition:  Follow up in 6 month(s)  Signed, Almyra Deforest, [place]  05/04/2019 3:07 PM    [facility]

## 2019-05-14 ENCOUNTER — Other Ambulatory Visit: Payer: Self-pay | Admitting: Cardiology

## 2019-05-22 ENCOUNTER — Other Ambulatory Visit: Payer: Self-pay | Admitting: Cardiology

## 2019-05-24 NOTE — Telephone Encounter (Signed)
Rx has been sent to the pharmacy electronically. ° °

## 2019-06-07 ENCOUNTER — Encounter: Payer: Medicare HMO | Admitting: *Deleted

## 2019-06-08 ENCOUNTER — Telehealth: Payer: Self-pay

## 2019-06-08 NOTE — Telephone Encounter (Signed)
Left message for patient to remind of missed remote transmission.  

## 2019-06-14 ENCOUNTER — Encounter: Payer: Self-pay | Admitting: Cardiology

## 2019-07-16 ENCOUNTER — Ambulatory Visit (INDEPENDENT_AMBULATORY_CARE_PROVIDER_SITE_OTHER): Payer: Medicare HMO | Admitting: *Deleted

## 2019-07-16 DIAGNOSIS — I495 Sick sinus syndrome: Secondary | ICD-10-CM

## 2019-07-16 DIAGNOSIS — I480 Paroxysmal atrial fibrillation: Secondary | ICD-10-CM

## 2019-07-16 LAB — CUP PACEART REMOTE DEVICE CHECK
Battery Impedance: 323 Ohm
Battery Remaining Longevity: 107 mo
Battery Voltage: 2.78 V
Brady Statistic AP VP Percent: 0 %
Brady Statistic AP VS Percent: 63 %
Brady Statistic AS VP Percent: 0 %
Brady Statistic AS VS Percent: 36 %
Date Time Interrogation Session: 20200724012951
Implantable Lead Implant Date: 20150805
Implantable Lead Implant Date: 20150805
Implantable Lead Location: 753859
Implantable Lead Location: 753860
Implantable Lead Model: 5076
Implantable Lead Model: 5076
Implantable Pulse Generator Implant Date: 20150804
Lead Channel Impedance Value: 496 Ohm
Lead Channel Impedance Value: 553 Ohm
Lead Channel Pacing Threshold Amplitude: 0.75 V
Lead Channel Pacing Threshold Amplitude: 0.875 V
Lead Channel Pacing Threshold Pulse Width: 0.4 ms
Lead Channel Pacing Threshold Pulse Width: 0.4 ms
Lead Channel Setting Pacing Amplitude: 2 V
Lead Channel Setting Pacing Amplitude: 2.5 V
Lead Channel Setting Pacing Pulse Width: 0.4 ms
Lead Channel Setting Sensing Sensitivity: 5.6 mV

## 2019-07-22 NOTE — Progress Notes (Signed)
Remote pacemaker transmission.   

## 2019-08-19 ENCOUNTER — Other Ambulatory Visit: Payer: Self-pay | Admitting: Cardiology

## 2019-10-18 ENCOUNTER — Encounter: Payer: Medicare HMO | Admitting: *Deleted

## 2019-11-15 NOTE — Progress Notes (Signed)
Veronica Young Date: 11/16/2019  ID:  Veronica Young, DOB December 05, 1933, MRN [id number]  PCP: Katherina Mires, MD Primary Cardiologist: Peter [name], MD Electrophysiologist: Dr. Lovena Le   CC: Pacemaker follow-up  Sign language interpreter present   Veronica Young is a 85 y.o. female seen today for Dr. Lovena Le . she presents today for routine Veronica followup.  Since last being seen in our clinic, the patient reports doing very well.  she denies chest pain, palpitations, dyspnea, PND, orthopnea, nausea, vomiting, dizziness, syncope, edema, weight gain, or early satiety.    Device History: Medtronic Dual Chamber PPM implanted 07/27/2014 for SSS  Past Medical History:  Diagnosis Date  . Carotid bruit   . CHF (congestive heart failure) ([place])   . Deafness   . Diabetes mellitus    type 2  . Diastolic heart failure (HCC)    EF is 60%  . GERD (gastroesophageal reflux disease)   . Glaucoma   . Hypercholesterolemia   . Hypertension   . Hypothyroidism   . Normal cardiac stress test January 2014  . PAD (peripheral artery disease) ([place])    a. known bilateral popliteal occlusions with conservative management favored.  [name] PAT (paroxysmal atrial tachycardia) ([place])   . Sick sinus syndrome [facility])    a. s/p PPM placement 07/2014   Past Surgical History:  Procedure Laterality Date  . cholycystectomy    . left breast cyst removed    . PACEMAKER INSERTION  07-26-2014   MDT ADDRL1 pacemaker implanted by Dr Lovena Le for SSS  . PERMANENT PACEMAKER INSERTION N/A 07/26/2014   Procedure: PERMANENT PACEMAKER INSERTION;  Surgeon: Evans Lance, MD;  Location: [facility] CATH LAB;  Service: Cardiovascular;  Laterality: N/A;  . TOTAL KNEE ARTHROPLASTY     right    Current Outpatient Medications  Medication Sig Dispense Refill  . aspirin EC 81 MG tablet Take 81 mg by mouth at bedtime.     [name] atorvastatin (LIPITOR) 80 MG tablet Take 80 mg by mouth at bedtime.     [name] atropine 1 % ophthalmic  solution Place 1 drop into the left eye at bedtime.     . brimonidine (ALPHAGAN) 0.2 % ophthalmic solution Place 1 drop into both eyes daily.    . calcium-vitamin D (OSCAL WITH D) 500-200 MG-UNIT per tablet Take 1 tablet by mouth daily with breakfast.    . cholecalciferol (VITAMIN D) 1000 UNITS tablet Take 1,000 Units by mouth daily.    . cilostazol (PLETAL) 50 MG tablet TAKE 1 TABLET TWICE DAILY 180 tablet 2  . citalopram (CELEXA) 20 MG tablet Take 20 mg by mouth at bedtime.     . dorzolamide-timolol (COSOPT) 22.3-6.8 MG/ML ophthalmic solution Place 1 drop into both eyes 2 (two) times daily.     [name] gabapentin (NEURONTIN) 100 MG capsule Take 100 mg by mouth at bedtime.    . hydrALAZINE (APRESOLINE) 50 MG tablet Take 50 mg by mouth 3 (three) times daily.    [name] latanoprost (XALATAN) 0.005 % ophthalmic solution Place 1 drop into both eyes daily.  12  . levothyroxine (SYNTHROID, LEVOTHROID) 112 MCG tablet Take 112 mcg by mouth daily before breakfast.     . lisinopril (ZESTRIL) 40 MG tablet TAKE 1 TABLET EVERY DAY 90 tablet 2  . metoprolol tartrate (LOPRESSOR) 25 MG tablet TAKE 1 TABLET BY MOUTH 2 TIMES DAILY. TAKE EXTRA 25MG  ( 1 TABLET) FOR PALPITATIONS AS NEEDED. 225 tablet 2   No current facility-administered medications for this  visit.     Allergies:   Amlodipine and Benicar [olmesartan medoxomil]   Social History: Social History   Socioeconomic History  . Marital status: Widowed    Spouse name: Not on file  . Number of children: 4  . Years of education: Not on file  . Highest education level: Not on file  Occupational History  . Not on file  Social Needs  . Financial resource strain: Not on file  . Food insecurity    Worry: Not on file    Inability: Not on file  . Transportation needs    Medical: Not on file    Non-medical: Not on file  Tobacco Use  . Smoking status: Never Smoker  . Smokeless tobacco: Never Used  Substance and Sexual Activity  . Alcohol use: No  . Drug use:  No  . Sexual activity: Never  Lifestyle  . Physical activity    Days per week: Not on file    Minutes per session: Not on file  . Stress: Not on file  Relationships  . Social Herbalist on phone: Not on file    Gets together: Not on file    Attends religious service: Not on file    Active member of club or organization: Not on file    Attends meetings of clubs or organizations: Not on file    Relationship status: Not on file  . Intimate partner violence    Fear of current or ex partner: Not on file    Emotionally abused: Not on file    Physically abused: Not on file    Forced sexual activity: Not on file  Other Topics Concern  . Not on file  Social History Narrative  . Not on file    Family History: Family History  Problem Relation Age of Onset  . Heart disease Mother   . Liver cancer Father   . Heart disease Sister   . Heart disease Brother   . Stroke Sister      Review of Systems: All other systems reviewed and are otherwise negative except as noted above.  Physical Exam: Vitals:   11/16/19 1154  Weight: 144 lb (65.3 kg)  Height: 5\' 5"  (1.651 m)     GEN- The patient is well appearing, alert and oriented x 3 today.   HEENT: normocephalic, atraumatic; sclera clear, conjunctiva pink; hearing intact; oropharynx clear; neck supple  Lungs- Clear to ausculation bilaterally, normal work of breathing.  No wheezes, rales, rhonchi Heart- Regular rate and rhythm, no murmurs, rubs or gallops  GI- soft, non-tender, non-distended, bowel sounds present  Extremities- no clubbing, cyanosis, or edema  MS- no significant deformity or atrophy Skin- warm and dry, no rash or lesion; PPM pocket well healed Psych- euthymic mood, full affect Neuro- strength and sensation are intact  PPM Interrogation- reviewed in detail today,  See PACEART report  EKG:  EKG is ordered today. The ekg ordered today shows a paced rhythm at 62 bpm  Recent Labs: No results found for  requested labs within last 8760 hours.   Wt Readings from Last 3 Encounters:  11/16/19 144 lb (65.3 kg)  05/04/19 145 lb (65.8 kg)  01/16/18 139 lb (63 kg)     Other studies Reviewed: Additional studies/ records that were reviewed today include: Echo 05/2017 shows LVEF 65-70%, Previous EP office notes, Previous remote checks, Most recent labwork.   Assessment and Plan:  1.  Symptomatic bradycardia due to SSS s/p Medtronic PPM  Normal PPM function See Claudia Desanctis Art report No changes today  2. HTN No changes  3. PAT, now with paroxsymal atrial fibrillation Low burden Can take extra metoprolol prn. Pt had > 50 hr episode of AF with undersensing in October, reviewed with Dr. Lovena Le.  With CHA2DS2VASC of 7, will plan on starting Eliquis pending labwork today. Discussed at length with patient via interpreter.  Will stop ASA with starting Eliquis.  Current medicines are reviewed at length with the patient today.   The patient does not have concerns regarding her medicines.  The following changes were made today:  Plan to start Eliquis pending labwork.   Labs/ tests ordered today include:  Orders Placed This Encounter  Procedures  . Basic metabolic panel  . CBC  . HEART 12-Lead    Disposition:   Follow up with EP APP in 1 month due to starting [facility], then plan Dr. Lovena Le in 6 Months    Signed, Shirley Friar, PA-C  11/16/2019 12:05 PM  [place] [address] [place] [address] [phone] (office) [phone] (fax)

## 2019-11-16 ENCOUNTER — Other Ambulatory Visit: Payer: Self-pay

## 2019-11-16 ENCOUNTER — Ambulatory Visit: Payer: Medicare HMO | Admitting: Student

## 2019-11-16 VITALS — BP 144/78 | HR 62 | Ht 65.0 in | Wt 144.0 lb

## 2019-11-16 DIAGNOSIS — I495 Sick sinus syndrome: Secondary | ICD-10-CM | POA: Diagnosis not present

## 2019-11-16 DIAGNOSIS — I480 Paroxysmal atrial fibrillation: Secondary | ICD-10-CM

## 2019-11-16 DIAGNOSIS — I10 Essential (primary) hypertension: Secondary | ICD-10-CM | POA: Diagnosis not present

## 2019-11-16 LAB — CUP PACEART INCLINIC DEVICE CHECK
Battery Impedance: 371 Ohm
Battery Remaining Longevity: 102 mo
Battery Voltage: 2.78 V
Brady Statistic AP VP Percent: 0 %
Brady Statistic AP VS Percent: 67 %
Brady Statistic AS VP Percent: 0 %
Brady Statistic AS VS Percent: 33 %
Date Time Interrogation Session: 20201124130216
Implantable Lead Implant Date: 20150805
Implantable Lead Implant Date: 20150805
Implantable Lead Location: 753859
Implantable Lead Location: 753860
Implantable Lead Model: 5076
Implantable Lead Model: 5076
Implantable Pulse Generator Implant Date: 20150804
Lead Channel Impedance Value: 511 Ohm
Lead Channel Impedance Value: 518 Ohm
Lead Channel Pacing Threshold Amplitude: 0.75 V
Lead Channel Pacing Threshold Amplitude: 0.75 V
Lead Channel Pacing Threshold Amplitude: 0.75 V
Lead Channel Pacing Threshold Amplitude: 0.875 V
Lead Channel Pacing Threshold Pulse Width: 0.4 ms
Lead Channel Pacing Threshold Pulse Width: 0.4 ms
Lead Channel Pacing Threshold Pulse Width: 0.4 ms
Lead Channel Pacing Threshold Pulse Width: 0.4 ms
Lead Channel Sensing Intrinsic Amplitude: 15.67 mV
Lead Channel Sensing Intrinsic Amplitude: 2 mV
Lead Channel Setting Pacing Amplitude: 2 V
Lead Channel Setting Pacing Amplitude: 2.5 V
Lead Channel Setting Pacing Pulse Width: 0.4 ms
Lead Channel Setting Sensing Sensitivity: 5.6 mV

## 2019-11-16 NOTE — Patient Instructions (Signed)
Medication Instructions:  Your physician recommends that you continue on your current medications as directed. Please refer to the Current Medication list given to you today.  *If you need a refill on your cardiac medications before your next appointment, please call your pharmacy*  Lab Work:  BMET AND CBC TODAY     If you have labs (blood work) drawn today and your tests are completely normal, you will receive your results only by: [name] MyChart Message (if you have MyChart) OR . A paper copy in the mail If you have any lab test that is abnormal or we need to change your treatment, we will call you to review the results.  Testing/Procedures: NONE ORDERED  TODAY   Follow-Up: At [facility], you and your health needs are our priority.  As part of our continuing mission to provide you with exceptional heart care, we have created designated Provider Care Teams.  These Care Teams include your primary Cardiologist (physician) and Advanced Practice Providers (APPs -  Physician Assistants and Nurse Practitioners) who all work together to provide you with the care you need, when you need it.  Your next appointment:   1 month(s)  The format for your next appointment:   In Person  Provider:  You may see Legrand Como "Oda Kilts, PA-CB   Other Instructions

## 2019-11-17 ENCOUNTER — Telehealth: Payer: Self-pay

## 2019-11-17 ENCOUNTER — Other Ambulatory Visit: Payer: Self-pay | Admitting: *Deleted

## 2019-11-17 LAB — CBC
Hematocrit: 40.1 % (ref 34.0–46.6)
Hemoglobin: 13.5 g/dL (ref 11.1–15.9)
MCH: 30.3 pg (ref 26.6–33.0)
MCHC: 33.7 g/dL (ref 31.5–35.7)
MCV: 90 fL (ref 79–97)
Platelets: 218 10*3/uL (ref 150–450)
RBC: 4.46 x10E6/uL (ref 3.77–5.28)
RDW: 13.5 % (ref 11.7–15.4)
WBC: 7.1 10*3/uL (ref 3.4–10.8)

## 2019-11-17 LAB — BASIC METABOLIC PANEL
BUN/Creatinine Ratio: 17 (ref 12–28)
BUN: 18 mg/dL (ref 8–27)
CO2: 26 mmol/L (ref 20–29)
Calcium: 9.3 mg/dL (ref 8.7–10.3)
Chloride: 99 mmol/L (ref 96–106)
Creatinine, Ser: 1.04 mg/dL — ABNORMAL HIGH (ref 0.57–1.00)
GFR calc Af Amer: 57 mL/min/{1.73_m2} — ABNORMAL LOW (ref >59)
GFR calc non Af Amer: 49 mL/min/{1.73_m2} — ABNORMAL LOW (ref >59)
Glucose: 92 mg/dL (ref 65–99)
Potassium: 4.4 mmol/L (ref 3.5–5.2)
Sodium: 135 mmol/L (ref 134–144)

## 2019-11-17 MED ORDER — APIXABAN 5 MG PO TABS: 5.0000 mg | ORAL_TABLET | Freq: Two times a day (BID) | ORAL | 11 refills | Status: DC

## 2019-11-17 NOTE — Telephone Encounter (Signed)
Informed patient's daughter of changes. Patient's daughter will make sure patient stops cilostazol and aspirin.

## 2019-11-17 NOTE — Telephone Encounter (Signed)
-----   Message from Shirley Friar, PA-C sent at 11/17/2019  9:03 AM EST ----- Will need to start on Eliquis 5 mg BID, please.   Her main number in her chart is a really through Sign language interpreters.   Thank you!   Legrand Como [address] [place], PA-C 11/17/2019 9:02 AM

## 2019-11-17 NOTE — Telephone Encounter (Signed)
Called patient with results. Patient will start Eliquis 5 mg BID. Patient will come up to office to pick up samples and 30 day free trail card.   Patient needs to stop Cilostazol and Aspirin also, per Oda Kilts PA. Called patient back to let her know. Left detailed message. Will also write note of instructions and put with samples.

## 2019-11-17 NOTE — Telephone Encounter (Signed)
Patients daughter is calling to speak with the nurse in regards to previous message. Mrs. Shown states her brother called asking her to call back because he did not understand what the nurse told them to do.

## 2019-12-08 ENCOUNTER — Telehealth: Payer: Self-pay | Admitting: Internal Medicine

## 2019-12-08 NOTE — Telephone Encounter (Signed)
I left the patient's daughter [name]) message. 12/16, regarding patient's Eliquis/nose bleeds.

## 2019-12-08 NOTE — Telephone Encounter (Signed)
Pt c/o medication issue:  1. Name of Medication: apixaban (ELIQUIS) 5 MG TABS tablet  2. How are you currently taking this medication (dosage and times per day)? As directed  3. Are you having a reaction (difficulty breathing--STAT)? Nose bleeds  4. What is your medication issue? Per appt with Barrington Ellison on 11/24, patient was taken off of one blood thinner and put on Eliquis. She states that her mother has a nose bleed the last couple of days. Not sure if it has something to do with the medication change.

## 2019-12-08 NOTE — Telephone Encounter (Signed)
I spoke to the patient's daughter with Andy's recommendations.  She verbalized understanding.

## 2019-12-08 NOTE — Telephone Encounter (Signed)
I spoke to the patient's daughter [name]) who called because the patient started Eliquis 5 mg bid on 11/25.    This past Monday night 12/14, she experienced a nose bleed lasting about 10-15 minutes.  It happened again this morning, lasting 10 minutes, before stopping.  She said that it is not bleeding profusely, but is steady.  She is not sure if climate related, but since this is the most recent medication started, there is concern.  She has an appointment scheduled (1 month f/u) with Jonni Sanger on 12/21.  Please advise, thank you.

## 2019-12-08 NOTE — Telephone Encounter (Signed)
Thank you!  Please make sure she stopped her Pletal and ASA, both.   She should continue her current dosage.  If she has another nose bleed, she should pinch her nose and lean *forward*, not backward as some do.   If recurs and cannot get the bleeding to stop, Should be evaluated in the ED or urgent care.   We will assess further and get labwork at her visit next week.   Legrand Como [address]" [place], PA-C  12/08/2019 11:44 AM

## 2019-12-12 NOTE — Progress Notes (Signed)
Electrophysiology Office Note Date: 12/13/2019  ID:  Veronica Young, DOB 1933-03-11, MRN [id number]  PCP: Katherina Mires, MD Primary Cardiologist: Peter [name], MD Electrophysiologist: Dr. Lovena Le   CC: Pacemaker follow-up  Veronica Young is a 85 y.o. female seen today for Dr. Lovena Le . she presents today for routine electrophysiology followup.  Since last being seen in our clinic, the patient reports doing very well.  she denies chest pain, palpitations, dyspnea, PND, orthopnea, nausea, vomiting, dizziness, syncope, edema, weight gain, or early satiety.  She had mild nose bleeds shortly after starting Eliquis, for 2-3 days the first week, but they have resolved. They lasted about 5-10 minutes a piece, and soiled a tissue, but did not run down her face.   Device History: Medtronic Dual Chamber PPM implanted 07/2014 for SSS  Past Medical History:  Diagnosis Date  . Carotid bruit   . CHF (congestive heart failure) ([place])   . Deafness   . Diabetes mellitus    type 2  . Diastolic heart failure (H[facility])    EF is 60%  . GERD (gastroesophageal reflux disease)   . Glaucoma   . Hypercholesterolemia   . Hypertension   . Hypothyroidism   . Normal cardiac stress test January 2014  . PAD (peripheral artery disease) ([place])    a. known bilateral popliteal occlusions with conservative management favored.  [name] PAT (paroxysmal atrial tachycardia) ([place])   . Sick sinus syndrome [facility])    a. s/p PPM placement 07/2014   Past Surgical History:  Procedure Laterality Date  . cholycystectomy    . left breast cyst removed    . PACEMAKER INSERTION  07-26-2014   MDT ADDRL1 pacemaker implanted by Dr Lovena Le for SSS  . PERMANENT PACEMAKER INSERTION N/A 07/26/2014   Procedure: PERMANENT PACEMAKER INSERTION;  Surgeon: Evans Lance, MD;  Location: [facility] CATH LAB;  Service: Cardiovascular;  Laterality: N/A;  . TOTAL KNEE ARTHROPLASTY     right    Current Outpatient Medications  Medication Sig Dispense Refill  .  apixaban (ELIQUIS) 5 MG TABS tablet Take 1 tablet (5 mg total) by mouth 2 (two) times daily. 60 tablet 11  . atorvastatin (LIPITOR) 80 MG tablet Take 80 mg by mouth at bedtime.     [name] atropine 1 % ophthalmic solution Place 1 drop into the left eye at bedtime.     . brimonidine (ALPHAGAN) 0.2 % ophthalmic solution Place 1 drop into both eyes daily.    . calcium-vitamin D (OSCAL WITH D) 500-200 MG-UNIT per tablet Take 1 tablet by mouth daily with breakfast.    . cholecalciferol (VITAMIN D) 1000 UNITS tablet Take 1,000 Units by mouth daily.    . citalopram (CELEXA) 20 MG tablet Take 20 mg by mouth at bedtime.     . dorzolamide-timolol (COSOPT) 22.3-6.8 MG/ML ophthalmic solution Place 1 drop into both eyes 2 (two) times daily.     [name] gabapentin (NEURONTIN) 100 MG capsule Take 100 mg by mouth at bedtime.    . hydrALAZINE (APRESOLINE) 50 MG tablet Take 50 mg by mouth 3 (three) times daily.    [name] latanoprost (XALATAN) 0.005 % ophthalmic solution Place 1 drop into both eyes daily.  12  . levothyroxine (SYNTHROID, LEVOTHROID) 112 MCG tablet Take 112 mcg by mouth daily before breakfast.     . lisinopril (ZESTRIL) 40 MG tablet TAKE 1 TABLET EVERY DAY 90 tablet 2  . metoprolol tartrate (LOPRESSOR) 25 MG tablet TAKE 1 TABLET BY MOUTH 2 TIMES  DAILY. TAKE EXTRA 25MG  ( 1 TABLET) FOR PALPITATIONS AS NEEDED. 225 tablet 2   No current facility-administered medications for this visit.    Allergies:   Amlodipine and Benicar [olmesartan medoxomil]   Social History: Social History   Socioeconomic History  . Marital status: Widowed    Spouse name: Not on file  . Number of children: 4  . Years of education: Not on file  . Highest education level: Not on file  Occupational History  . Not on file  Tobacco Use  . Smoking status: Never Smoker  . Smokeless tobacco: Never Used  Substance and Sexual Activity  . Alcohol use: No  . Drug use: No  . Sexual activity: Never  Other Topics Concern  . Not on file    Social History Narrative  . Not on file   Social Determinants of Health   Financial Resource Strain:   . Difficulty of Paying Living Expenses: Not on file  Food Insecurity:   . Worried About Charity fundraiser in the Last Year: Not on file  . Ran Out of Food in the Last Year: Not on file  Transportation Needs:   . Lack of Transportation (Medical): Not on file  . Lack of Transportation (Non-Medical): Not on file  Physical Activity:   . Days of Exercise per Week: Not on file  . Minutes of Exercise per Session: Not on file  Stress:   . Feeling of Stress : Not on file  Social Connections:   . Frequency of Communication with Friends and Family: Not on file  . Frequency of Social Gatherings with Friends and Family: Not on file  . Attends Religious Services: Not on file  . Active Member of Clubs or Organizations: Not on file  . Attends Archivist Meetings: Not on file  . Marital Status: Not on file  Intimate Partner Violence:   . Fear of Current or Ex-Partner: Not on file  . Emotionally Abused: Not on file  . Physically Abused: Not on file  . Sexually Abused: Not on file    Family History: Family History  Problem Relation Age of Onset  . Heart disease Mother   . Liver cancer Father   . Heart disease Sister   . Heart disease Brother   . Stroke Sister      Review of Systems: All other systems reviewed and are otherwise negative except as noted above.  Physical Exam: Vitals:   12/13/19 1116  BP: 140/78  Pulse: 76  SpO2: 95%  Weight: 139 lb (63 kg)  Height: 5\' 5"  (1.651 m)     GEN- The patient is well appearing, alert and oriented x 3 today.   HEENT: normocephalic, atraumatic; sclera clear, conjunctiva pink; hearing intact; oropharynx clear; neck supple  Lungs- Clear to ausculation bilaterally, normal work of breathing.  No wheezes, rales, rhonchi Heart- Regular rate and rhythm, no murmurs, rubs or gallops  GI- soft, non-tender, non-distended, bowel  sounds present  Extremities- no clubbing, cyanosis, or edema  MS- no significant deformity or atrophy Skin- warm and dry, no rash or lesion; PPM pocket well healed Psych- euthymic mood, full affect Neuro- strength and sensation are intact  PPM Interrogation- reviewed in detail today,  See PACEART report  EKG:  EKG is not ordered today.  Recent Labs: 11/16/2019: BUN 18; Creatinine, Ser 1.04; Hemoglobin 13.5; Platelets 218; Potassium 4.4; Sodium 135   Wt Readings from Last 3 Encounters:  12/13/19 139 lb (63 kg)  11/16/19 144  lb (65.3 kg)  05/04/19 145 lb (65.8 kg)     Other studies Reviewed: Additional studies/ records that were reviewed today include: Echo 05/2017 shows LVEF 65-70%, Previous EP office notes, Previous remote checks, Most recent labwork.   Assessment and Plan:  1. Symptomatic bradycardia due to SSS s/p Medtronic PPM  Normal PPM function See Pace Art report No changes today  2. HTN Continue current regimen  3. Paroxysmal atrial fibrillation Burden overall low.  Recently started on Eliquis for CHA2DS2VASC of 7  No significant bleeding on Eliquis CBC today.   Current medicines are reviewed at length with the patient today.   The patient does not have concerns regarding her medicines.  The following changes were made today:  none  Labs/ tests ordered today include:  Orders Placed This Encounter  Procedures  . CBC   Disposition:   Follow up with Dr. Lovena Le in 6 Months   Signed, Shirley Friar, PA-C  12/13/2019 12:02 PM  [place] [address] [phone] [phone] (office) [phone] (fax)

## 2019-12-13 ENCOUNTER — Encounter: Payer: Self-pay | Admitting: Student

## 2019-12-13 ENCOUNTER — Ambulatory Visit: Payer: Medicare HMO | Admitting: Student

## 2019-12-13 ENCOUNTER — Other Ambulatory Visit: Payer: Self-pay

## 2019-12-13 VITALS — BP 140/78 | HR 76 | Ht 65.0 in | Wt 139.0 lb

## 2019-12-13 DIAGNOSIS — I480 Paroxysmal atrial fibrillation: Secondary | ICD-10-CM | POA: Diagnosis not present

## 2019-12-13 DIAGNOSIS — Z79899 Other long term (current) drug therapy: Secondary | ICD-10-CM

## 2019-12-13 DIAGNOSIS — I495 Sick sinus syndrome: Secondary | ICD-10-CM | POA: Diagnosis not present

## 2019-12-13 LAB — CUP PACEART INCLINIC DEVICE CHECK
Battery Impedance: 420 Ohm
Battery Remaining Longevity: 95 mo
Battery Voltage: 2.78 V
Brady Statistic AP VP Percent: 0 %
Brady Statistic AP VS Percent: 95 %
Brady Statistic AS VP Percent: 0 %
Brady Statistic AS VS Percent: 4 %
Date Time Interrogation Session: 20201221120718
Implantable Lead Implant Date: 20150805
Implantable Lead Implant Date: 20150805
Implantable Lead Location: 753859
Implantable Lead Location: 753860
Implantable Lead Model: 5076
Implantable Lead Model: 5076
Implantable Pulse Generator Implant Date: 20150804
Lead Channel Impedance Value: 503 Ohm
Lead Channel Impedance Value: 555 Ohm
Lead Channel Pacing Threshold Amplitude: 0.625 V
Lead Channel Pacing Threshold Amplitude: 0.75 V
Lead Channel Pacing Threshold Amplitude: 0.875 V
Lead Channel Pacing Threshold Amplitude: 1 V
Lead Channel Pacing Threshold Pulse Width: 0.4 ms
Lead Channel Pacing Threshold Pulse Width: 0.4 ms
Lead Channel Pacing Threshold Pulse Width: 0.4 ms
Lead Channel Pacing Threshold Pulse Width: 0.4 ms
Lead Channel Sensing Intrinsic Amplitude: 15.67 mV
Lead Channel Sensing Intrinsic Amplitude: 2 mV
Lead Channel Setting Pacing Amplitude: 2 V
Lead Channel Setting Pacing Amplitude: 2.5 V
Lead Channel Setting Pacing Pulse Width: 0.4 ms
Lead Channel Setting Sensing Sensitivity: 5.6 mV

## 2019-12-13 NOTE — Patient Instructions (Signed)
Medication Instructions:  none *If you need a refill on your cardiac medications before your next appointment, please call your pharmacy*  Lab Work:TODAY CBC If you have labs (blood work) drawn today and your tests are completely normal, you will receive your results only by: [name] MyChart Message (if you have MyChart) OR . A paper copy in the mail If you have any lab test that is abnormal or we need to change your treatment, we will call you to review the results.  Testing/Procedures: none  Follow-Up: At [facility], you and your health needs are our priority.  As part of our continuing mission to provide you with exceptional heart care, we have created designated Provider Care Teams.  These Care Teams include your primary Cardiologist (physician) and Advanced Practice Providers (APPs -  Physician Assistants and Nurse Practitioners) who all work together to provide you with the care you need, when you need it.  Your next appointment:   6 MONTHS  The format for your next appointment:   In Person Provider:  Oda Kilts, PA   Other Instructions Remote monitoring is used to monitor your Pacemaker  from home. This monitoring reduces the number of office visits required to check your device to one time per year. It allows [place] to keep an eye on the functioning of your device to ensure it is working properly. You are scheduled for a device check from home on 01/17/20. You may send your transmission at any time that day. If you have a wireless device, the transmission will be sent automatically. After your physician reviews your transmission, you will receive a postcard with your next transmission date.

## 2019-12-14 LAB — CBC
Hematocrit: 41.4 % (ref 34.0–46.6)
Hemoglobin: 13.6 g/dL (ref 11.1–15.9)
MCH: 30.4 pg (ref 26.6–33.0)
MCHC: 32.9 g/dL (ref 31.5–35.7)
MCV: 92 fL (ref 79–97)
Platelets: 216 10*3/uL (ref 150–450)
RBC: 4.48 x10E6/uL (ref 3.77–5.28)
RDW: 13.1 % (ref 11.7–15.4)
WBC: 6.1 10*3/uL (ref 3.4–10.8)

## 2019-12-15 ENCOUNTER — Other Ambulatory Visit (HOSPITAL_COMMUNITY): Payer: Self-pay | Admitting: Cardiovascular Disease

## 2019-12-15 DIAGNOSIS — I739 Peripheral vascular disease, unspecified: Secondary | ICD-10-CM

## 2019-12-28 ENCOUNTER — Encounter (HOSPITAL_COMMUNITY): Payer: Medicare HMO

## 2019-12-29 ENCOUNTER — Inpatient Hospital Stay (HOSPITAL_COMMUNITY): Admission: RE | Admit: 2019-12-29 | Payer: Medicare HMO | Source: Ambulatory Visit

## 2020-01-05 ENCOUNTER — Telehealth: Payer: Self-pay | Admitting: Student

## 2020-01-05 NOTE — Telephone Encounter (Signed)
Patient calling the office for samples of medication:   1.  What medication and dosage are you requesting samples for? apixaban (ELIQUIS) 5 MG TABS tablet  2.  Are you currently out of this medication? No  Patient's daughter is calling requesting some samples of Eliquis so the patient has time to save up money to get a prescription. She states the patient only has a few tablets left.

## 2020-01-05 NOTE — Telephone Encounter (Signed)
**Note De-Identified Marrell Dicaprio Obfuscation** The pt came to the phone and gave me permission to s/w her daughter [name] on her behave.  The pts daughter states that $45 for a 30 day supply of Eliquis is too expensive for the pt as she takes lots of expensive medications. I advised her that $45 is the lowest we will be able to get it through her INS plan.  Rose is not interested in the pt taking Coumadin unless she has to.  I have provided her the phone number to reach out to the [facility] for asst and I will reach out to Raquel Sarna, our social worker to contact Rose to See if they can offer some assistance to the pt.  Rose thanked me for calling her back and for the 2 sample bottles of Eliquis 5 mg we are leaving for the pt downstairs. I hope this gives Rose enough time to find asst for the pts Eliquis.

## 2020-01-05 NOTE — Telephone Encounter (Signed)
[facility] the cost to the pt for her Eliquis is $45/30 day supply. This is the lowest cost we can get for the pt through her INS plan. If she cannot afford this amount a medication change may be needed. No samples will be given at this time.  The pt has not signed a release of information form allowing [place] to s/w Rose concerning her care.  I have left a detailed message on Rose's VM explaining this and that she will need her mother to come to the phone to give me permission to s/w her when she calls back.

## 2020-01-05 NOTE — Telephone Encounter (Signed)
Wyonia Hough. LPN, this pt is asking for samples of Eliquis, can you advise on this matter? Thank you

## 2020-01-05 NOTE — Telephone Encounter (Signed)
Patient's daughter returning call. 

## 2020-01-07 ENCOUNTER — Encounter (HOSPITAL_COMMUNITY): Payer: Medicare HMO

## 2020-01-11 ENCOUNTER — Telehealth: Payer: Self-pay | Admitting: Cardiovascular Disease

## 2020-01-11 NOTE — Telephone Encounter (Signed)
Spoke with patient's daughter on the phone and informed that it is okay for her mother to get the Covid vaccine. No other questions or concerns at this time.

## 2020-01-11 NOTE — Telephone Encounter (Signed)
New Message:    Daughter wants to know will it be alright for pt to take the COVID Vaccine?

## 2020-01-17 ENCOUNTER — Ambulatory Visit (INDEPENDENT_AMBULATORY_CARE_PROVIDER_SITE_OTHER): Payer: Medicare HMO | Admitting: *Deleted

## 2020-01-17 DIAGNOSIS — I495 Sick sinus syndrome: Secondary | ICD-10-CM | POA: Diagnosis not present

## 2020-01-18 ENCOUNTER — Telehealth: Payer: Self-pay

## 2020-01-18 NOTE — Telephone Encounter (Signed)
Left message for patient to remind of missed remote transmission.  

## 2020-01-21 ENCOUNTER — Telehealth: Payer: Self-pay

## 2020-01-21 LAB — CUP PACEART REMOTE DEVICE CHECK
Battery Impedance: 420 Ohm
Battery Remaining Longevity: 97 mo
Battery Voltage: 2.78 V
Brady Statistic AP VP Percent: 0 %
Brady Statistic AP VS Percent: 57 %
Brady Statistic AS VP Percent: 0 %
Brady Statistic AS VS Percent: 43 %
Date Time Interrogation Session: 20210128172358
Implantable Lead Implant Date: 20150805
Implantable Lead Implant Date: 20150805
Implantable Lead Location: 753859
Implantable Lead Location: 753860
Implantable Lead Model: 5076
Implantable Lead Model: 5076
Implantable Pulse Generator Implant Date: 20150804
Lead Channel Impedance Value: 478 Ohm
Lead Channel Impedance Value: 482 Ohm
Lead Channel Pacing Threshold Amplitude: 0.75 V
Lead Channel Pacing Threshold Amplitude: 0.875 V
Lead Channel Pacing Threshold Pulse Width: 0.4 ms
Lead Channel Pacing Threshold Pulse Width: 0.4 ms
Lead Channel Setting Pacing Amplitude: 2 V
Lead Channel Setting Pacing Amplitude: 2.5 V
Lead Channel Setting Pacing Pulse Width: 0.4 ms
Lead Channel Setting Sensing Sensitivity: 5.6 mV

## 2020-01-21 NOTE — Telephone Encounter (Signed)
PM transmission received, noted pt currently in persistent AF since 01/07/20, rates appear controlled.   Spoke with pt daughter, Veronica Young, who relayed info to pt since she is HOH.  Pt is asymptomatic.  Pt confirmed taking medications as ordered including Eliquis 5mg  BID and Metoprolol 25mg  BID.  Pt denies that she has needed any PRN doses of Metoprolol.  Reaffirmed importance of [place] for secondary prevention of Stroke, educated that [facility] does not treat AFIB and needs to be continued.    Advised will forward to MD for review and if necessary any recommendations.

## 2020-01-30 NOTE — Telephone Encounter (Signed)
Agree with above. GT

## 2020-02-09 ENCOUNTER — Other Ambulatory Visit: Payer: Self-pay | Admitting: Student

## 2020-02-09 MED ORDER — APIXABAN 5 MG PO TABS: 5.0000 mg | ORAL_TABLET | Freq: Two times a day (BID) | ORAL | 5 refills | Status: DC

## 2020-02-09 NOTE — Addendum Note (Signed)
Addended by: Brynda Peon on: 02/09/2020 04:15 PM   Modules accepted: Orders

## 2020-02-09 NOTE — Telephone Encounter (Addendum)
Pt last saw Barrington Ellison, [place] on 12/13/19, last labs 11/16/19 Creat 1.04, age 86, weight 63kg, based on specified criteria pt is on appropriate dosage of Eliquis 5mg  BID.  Will refill rx.

## 2020-02-09 NOTE — Telephone Encounter (Signed)
*  STAT* If patient is at the pharmacy, call can be transferred to refill team.   1. Which medications need to be refilled? (please list name of each medication and dose if known) apixaban (ELIQUIS) 5 MG TABS tablet  2. Which pharmacy/location (including street and city if local pharmacy) is medication to be sent to? [id number] [place] [place] [address]  3. Do they need a 30 day or 90 day supply? 30 day

## 2020-02-18 ENCOUNTER — Ambulatory Visit (HOSPITAL_COMMUNITY)
Admission: RE | Admit: 2020-02-18 | Discharge: 2020-02-18 | Disposition: A | Discharge status: Home / Self Care | Payer: Medicare HMO | Source: Ambulatory Visit | Attending: Cardiovascular Disease | Admitting: Cardiovascular Disease

## 2020-02-18 ENCOUNTER — Encounter: Payer: Self-pay | Admitting: Cardiovascular Disease

## 2020-02-18 ENCOUNTER — Other Ambulatory Visit: Payer: Self-pay

## 2020-02-18 ENCOUNTER — Ambulatory Visit: Payer: Medicare HMO | Admitting: Cardiovascular Disease

## 2020-02-18 VITALS — BP 155/93 | HR 85 | Ht 60.5 in | Wt 143.8 lb

## 2020-02-18 DIAGNOSIS — I739 Peripheral vascular disease, unspecified: Secondary | ICD-10-CM

## 2020-02-18 DIAGNOSIS — I480 Paroxysmal atrial fibrillation: Secondary | ICD-10-CM

## 2020-02-18 NOTE — Progress Notes (Signed)
02/18/2020 Veronica [name]   05/18/1933  [id number]  Primary Physician [place], Jannifer Rodney, MD Primary Cardiologist: Lorretta Harp MD Veronica [name], [place]  HPI:  Veronica [name] is a 86 y.o.  old thin appearing Caucasian female was accompanied by an interpreter Veronica [name]. She is deaf. She is a patient of Dr. Doug Sou with a history of hypertension, hyperlipidemia, permanent transvenous pacemaker insertion 8/15, paroxysmal atrial fibrillation on Eliquis, and diastolic heart failure with a normal EF. She lives independently. She does complain of lifestyle limiting claudication having to stop out approximately 1 mile. She is on low-dose Pletal with Dopplers that showed ABIs in the 0.5 range bilaterally with known occluded popliteal arteries.  Since I saw her 2-1/2 years ago she was on Pletal briefly which was stopped because of anticoagulation issues.  She denies claudication.  Dopplers in the past have revealed occluded right popliteal and left SFA with patent iliac arteries.    Current Meds  Medication Sig  . apixaban (ELIQUIS) 5 MG TABS tablet Take 1 tablet (5 mg total) by mouth 2 (two) times daily.  [name] atorvastatin (LIPITOR) 80 MG tablet Take 80 mg by mouth at bedtime.   [name] atropine 1 % ophthalmic solution Place 1 drop into the left eye at bedtime.   . brimonidine (ALPHAGAN) 0.2 % ophthalmic solution Place 1 drop into both eyes daily.  . calcium-vitamin D (OSCAL WITH D) 500-200 MG-UNIT per tablet Take 1 tablet by mouth daily with breakfast.  . cholecalciferol (VITAMIN D) 1000 UNITS tablet Take 1,000 Units by mouth daily.  . citalopram (CELEXA) 20 MG tablet Take 20 mg by mouth at bedtime.   . dorzolamide-timolol (COSOPT) 22.3-6.8 MG/ML ophthalmic solution Place 1 drop into both eyes 2 (two) times daily.   [name] gabapentin (NEURONTIN) 100 MG capsule Take 100 mg by mouth at bedtime.  . hydrALAZINE (APRESOLINE) 50 MG tablet Take 50 mg by mouth 3 (three) times daily.  [name] latanoprost (XALATAN) 0.005  % ophthalmic solution Place 1 drop into both eyes daily.  [name] levothyroxine (SYNTHROID, LEVOTHROID) 112 MCG tablet Take 112 mcg by mouth daily before breakfast.   . lisinopril (ZESTRIL) 40 MG tablet TAKE 1 TABLET EVERY DAY  . metoprolol tartrate (LOPRESSOR) 25 MG tablet TAKE 1 TABLET BY MOUTH 2 TIMES DAILY. TAKE EXTRA 25MG  ( 1 TABLET) FOR PALPITATIONS AS NEEDED.     Allergies  Allergen Reactions  . Amlodipine Nausea Only       . Benicar [Olmesartan Medoxomil] Nausea Only    Social History   Socioeconomic History  . Marital status: Widowed    Spouse name: Not on file  . Number of children: 4  . Years of education: Not on file  . Highest education level: Not on file  Occupational History  . Not on file  Tobacco Use  . Smoking status: Never Smoker  . Smokeless tobacco: Never Used  Substance and Sexual Activity  . Alcohol use: No  . Drug use: No  . Sexual activity: Never  Other Topics Concern  . Not on file  Social History Narrative  . Not on file   Social Determinants of Health   Financial Resource Strain:   . Difficulty of Paying Living Expenses: Not on file  Food Insecurity:   . Worried About Charity fundraiser in the Last Year: Not on file  . Ran Out of Food in the Last Year: Not on file  Transportation Needs:   . Lack of Transportation (Medical):  Not on file  . Lack of Transportation (Non-Medical): Not on file  Physical Activity:   . Days of Exercise per Week: Not on file  . Minutes of Exercise per Session: Not on file  Stress:   . Feeling of Stress : Not on file  Social Connections:   . Frequency of Communication with Friends and Family: Not on file  . Frequency of Social Gatherings with Friends and Family: Not on file  . Attends Religious Services: Not on file  . Active Member of Clubs or Organizations: Not on file  . Attends Archivist Meetings: Not on file  . Marital Status: Not on file  Intimate Partner Violence:   . Fear of Current or  Ex-Partner: Not on file  . Emotionally Abused: Not on file  . Physically Abused: Not on file  . Sexually Abused: Not on file     Review of Systems: General: negative for chills, fever, night sweats or weight changes.  Cardiovascular: negative for chest pain, dyspnea on exertion, edema, orthopnea, palpitations, paroxysmal nocturnal dyspnea or shortness of breath Dermatological: negative for rash Respiratory: negative for cough or wheezing Urologic: negative for hematuria Abdominal: negative for nausea, vomiting, diarrhea, bright red blood per rectum, melena, or hematemesis Neurologic: negative for visual changes, syncope, or dizziness All other systems reviewed and are otherwise negative except as noted above.    Blood pressure (!) 155/93, [name] 85, height 5' 0.5" (1.537 m), weight 143 lb 12.8 oz (65.2 kg), SpO2 94 %.  General appearance: alert and no distress Neck: no adenopathy, no carotid bruit, no JVD, supple, symmetrical, trachea midline and thyroid not enlarged, symmetric, no tenderness/mass/nodules Lungs: clear to auscultation bilaterally Heart: irregularly irregular rhythm Extremities: extremities normal, atraumatic, no cyanosis or edema Pulses: Diminished pedal pulses bilaterally Skin: Skin color, texture, turgor normal. No rashes or lesions Neurologic: Alert and oriented X 3, normal strength and tone. Normal symmetric reflexes. Normal coordination and gait  EKG atrial fibrillation with a ventricular spots of 85 with demand pacing pacing spikes, and septal Q waves.  I personally reviewed this EKG.  ASSESSMENT AND PLAN:   PAD (peripheral artery disease) ([place]) History of peripheral arterial disease with initial evaluation by myself 06/13/2017.  She does have known occluded right popliteal and left SFA with patent iliac arteries.  She was on Pletal briefly but was stopped because of anticoagulation issues.  She does not walk as much as she has in the past and denies claudication.   This point I see no benefit in continuing to repeat lower extremity arterial Doppler studies on a routine basis.  I will see her back on as-needed basis.      Lorretta Harp MD FACP,FACC,FAHA, [facility] 02/18/2020 9:30 AM

## 2020-02-18 NOTE — Patient Instructions (Signed)

## 2020-02-18 NOTE — Assessment & Plan Note (Signed)
History of peripheral arterial disease with initial evaluation by myself 06/13/2017.  She does have known occluded right popliteal and left SFA with patent iliac arteries.  She was on Pletal briefly but was stopped because of anticoagulation issues.  She does not walk as much as she has in the past and denies claudication.  This point I see no benefit in continuing to repeat lower extremity arterial Doppler studies on a routine basis.  I will see her back on as-needed basis.

## 2020-03-31 ENCOUNTER — Telehealth: Payer: Self-pay | Admitting: Cardiology

## 2020-03-31 NOTE — Telephone Encounter (Signed)
   Rose shown called, she said pt is having a lot of diarrhea, she said it might have related to pt is taken different kinds of meds. She would like to know if pt can take probiotics to help diarrhea   Please advise

## 2020-03-31 NOTE — Telephone Encounter (Signed)
Patient's daughter called in stating the patient has been having diarrhea and wanted to know if the patient could take probiotics. She has also been advised to try yogurt and to call PCP if this does not get any better.

## 2020-04-02 ENCOUNTER — Inpatient Hospital Stay (HOSPITAL_COMMUNITY)
Admission: EM | Admit: 2020-04-02 | Discharge: 2020-04-06 | DRG: 291 | Disposition: A | Discharge status: Home Health | Payer: Medicare HMO | Attending: Internal Medicine | Admitting: Internal Medicine

## 2020-04-02 ENCOUNTER — Encounter (HOSPITAL_COMMUNITY): Payer: Self-pay | Admitting: Emergency Medicine

## 2020-04-02 ENCOUNTER — Emergency Department (HOSPITAL_COMMUNITY): Payer: Medicare HMO

## 2020-04-02 ENCOUNTER — Other Ambulatory Visit: Payer: Self-pay

## 2020-04-02 DIAGNOSIS — H913 Deaf nonspeaking, not elsewhere classified: Secondary | ICD-10-CM | POA: Diagnosis present

## 2020-04-02 DIAGNOSIS — R197 Diarrhea, unspecified: Secondary | ICD-10-CM | POA: Diagnosis present

## 2020-04-02 DIAGNOSIS — Z66 Do not resuscitate: Secondary | ICD-10-CM | POA: Diagnosis present

## 2020-04-02 DIAGNOSIS — N179 Acute kidney failure, unspecified: Secondary | ICD-10-CM | POA: Diagnosis not present

## 2020-04-02 DIAGNOSIS — I509 Heart failure, unspecified: Secondary | ICD-10-CM

## 2020-04-02 DIAGNOSIS — I10 Essential (primary) hypertension: Secondary | ICD-10-CM | POA: Diagnosis present

## 2020-04-02 DIAGNOSIS — Z950 Presence of cardiac pacemaker: Secondary | ICD-10-CM | POA: Diagnosis not present

## 2020-04-02 DIAGNOSIS — K219 Gastro-esophageal reflux disease without esophagitis: Secondary | ICD-10-CM | POA: Diagnosis present

## 2020-04-02 DIAGNOSIS — I5033 Acute on chronic diastolic (congestive) heart failure: Secondary | ICD-10-CM | POA: Diagnosis present

## 2020-04-02 DIAGNOSIS — E1122 Type 2 diabetes mellitus with diabetic chronic kidney disease: Secondary | ICD-10-CM | POA: Diagnosis present

## 2020-04-02 DIAGNOSIS — E1151 Type 2 diabetes mellitus with diabetic peripheral angiopathy without gangrene: Secondary | ICD-10-CM | POA: Diagnosis present

## 2020-04-02 DIAGNOSIS — I495 Sick sinus syndrome: Secondary | ICD-10-CM | POA: Diagnosis present

## 2020-04-02 DIAGNOSIS — T502X5A Adverse effect of carbonic-anhydrase inhibitors, benzothiadiazides and other diuretics, initial encounter: Secondary | ICD-10-CM | POA: Diagnosis not present

## 2020-04-02 DIAGNOSIS — E876 Hypokalemia: Secondary | ICD-10-CM | POA: Diagnosis not present

## 2020-04-02 DIAGNOSIS — E039 Hypothyroidism, unspecified: Secondary | ICD-10-CM | POA: Diagnosis present

## 2020-04-02 DIAGNOSIS — R0902 Hypoxemia: Secondary | ICD-10-CM | POA: Diagnosis not present

## 2020-04-02 DIAGNOSIS — J9601 Acute respiratory failure with hypoxia: Secondary | ICD-10-CM | POA: Diagnosis not present

## 2020-04-02 DIAGNOSIS — N182 Chronic kidney disease, stage 2 (mild): Secondary | ICD-10-CM | POA: Diagnosis present

## 2020-04-02 DIAGNOSIS — M549 Dorsalgia, unspecified: Secondary | ICD-10-CM | POA: Diagnosis present

## 2020-04-02 DIAGNOSIS — G8929 Other chronic pain: Secondary | ICD-10-CM | POA: Diagnosis present

## 2020-04-02 DIAGNOSIS — Z96651 Presence of right artificial knee joint: Secondary | ICD-10-CM | POA: Diagnosis present

## 2020-04-02 DIAGNOSIS — Z7901 Long term (current) use of anticoagulants: Secondary | ICD-10-CM

## 2020-04-02 DIAGNOSIS — H409 Unspecified glaucoma: Secondary | ICD-10-CM | POA: Diagnosis present

## 2020-04-02 DIAGNOSIS — R609 Edema, unspecified: Secondary | ICD-10-CM | POA: Diagnosis not present

## 2020-04-02 DIAGNOSIS — E7800 Pure hypercholesterolemia, unspecified: Secondary | ICD-10-CM | POA: Diagnosis present

## 2020-04-02 DIAGNOSIS — I2510 Atherosclerotic heart disease of native coronary artery without angina pectoris: Secondary | ICD-10-CM | POA: Diagnosis present

## 2020-04-02 DIAGNOSIS — Z800 Family history of malignant neoplasm of digestive organs: Secondary | ICD-10-CM

## 2020-04-02 DIAGNOSIS — I5031 Acute diastolic (congestive) heart failure: Secondary | ICD-10-CM | POA: Diagnosis not present

## 2020-04-02 DIAGNOSIS — Z79899 Other long term (current) drug therapy: Secondary | ICD-10-CM | POA: Diagnosis not present

## 2020-04-02 DIAGNOSIS — Z8249 Family history of ischemic heart disease and other diseases of the circulatory system: Secondary | ICD-10-CM

## 2020-04-02 DIAGNOSIS — Z79890 Hormone replacement therapy (postmenopausal): Secondary | ICD-10-CM | POA: Diagnosis not present

## 2020-04-02 DIAGNOSIS — I739 Peripheral vascular disease, unspecified: Secondary | ICD-10-CM | POA: Diagnosis not present

## 2020-04-02 DIAGNOSIS — I4819 Other persistent atrial fibrillation: Secondary | ICD-10-CM | POA: Diagnosis present

## 2020-04-02 DIAGNOSIS — Z20822 Contact with and (suspected) exposure to covid-19: Secondary | ICD-10-CM | POA: Diagnosis present

## 2020-04-02 DIAGNOSIS — I480 Paroxysmal atrial fibrillation: Secondary | ICD-10-CM | POA: Diagnosis present

## 2020-04-02 DIAGNOSIS — I130 Hypertensive heart and chronic kidney disease with heart failure and stage 1 through stage 4 chronic kidney disease, or unspecified chronic kidney disease: Principal | ICD-10-CM | POA: Diagnosis present

## 2020-04-02 DIAGNOSIS — J9621 Acute and chronic respiratory failure with hypoxia: Secondary | ICD-10-CM | POA: Diagnosis present

## 2020-04-02 DIAGNOSIS — Z823 Family history of stroke: Secondary | ICD-10-CM

## 2020-04-02 DIAGNOSIS — J9600 Acute respiratory failure, unspecified whether with hypoxia or hypercapnia: Secondary | ICD-10-CM | POA: Diagnosis present

## 2020-04-02 DIAGNOSIS — R0602 Shortness of breath: Secondary | ICD-10-CM

## 2020-04-02 LAB — CBC WITH DIFFERENTIAL/PLATELET
Abs Immature Granulocytes: 0.02 10*3/uL (ref 0.00–0.07)
Basophils Absolute: 0.1 10*3/uL (ref 0.0–0.1)
Basophils Relative: 1 %
Eosinophils Absolute: 0.1 10*3/uL (ref 0.0–0.5)
Eosinophils Relative: 2 %
HCT: 44.1 % (ref 36.0–46.0)
Hemoglobin: 14 g/dL (ref 12.0–15.0)
Immature Granulocytes: 0 %
Lymphocytes Relative: 16 %
Lymphs Abs: 1 10*3/uL (ref 0.7–4.0)
MCH: 29.2 pg (ref 26.0–34.0)
MCHC: 31.7 g/dL (ref 30.0–36.0)
MCV: 92.1 fL (ref 80.0–100.0)
Monocytes Absolute: 0.4 10*3/uL (ref 0.1–1.0)
Monocytes Relative: 7 %
Neutro Abs: 4.7 10*3/uL (ref 1.7–7.7)
Neutrophils Relative %: 74 %
Platelets: 200 10*3/uL (ref 150–400)
RBC: 4.79 MIL/uL (ref 3.87–5.11)
RDW: 15.5 % (ref 11.5–15.5)
WBC: 6.3 10*3/uL (ref 4.0–10.5)
nRBC: 0 % (ref 0.0–0.2)

## 2020-04-02 LAB — C-REACTIVE PROTEIN: CRP: 0.8 mg/dL (ref <1.0)

## 2020-04-02 LAB — TROPONIN I (HIGH SENSITIVITY)
Troponin I (High Sensitivity): 6 ng/L (ref <18)
Troponin I (High Sensitivity): 6 ng/L (ref <18)

## 2020-04-02 LAB — BASIC METABOLIC PANEL
Anion gap: 9 (ref 5–15)
BUN: 22 mg/dL (ref 8–23)
CO2: 23 mmol/L (ref 22–32)
Calcium: 9.7 mg/dL (ref 8.9–10.3)
Chloride: 104 mmol/L (ref 98–111)
Creatinine, Ser: 1.15 mg/dL — ABNORMAL HIGH (ref 0.44–1.00)
GFR calc Af Amer: 51 mL/min — ABNORMAL LOW (ref >60)
GFR calc non Af Amer: 44 mL/min — ABNORMAL LOW (ref >60)
Glucose, Bld: 117 mg/dL — ABNORMAL HIGH (ref 70–99)
Potassium: 3.9 mmol/L (ref 3.5–5.1)
Sodium: 136 mmol/L (ref 135–145)

## 2020-04-02 LAB — PROCALCITONIN: Procalcitonin: <0.1 ng/mL

## 2020-04-02 LAB — D-DIMER, QUANTITATIVE: D-Dimer, Quant: 1.47 ug/mL-FEU — ABNORMAL HIGH (ref 0.00–0.50)

## 2020-04-02 LAB — LACTATE DEHYDROGENASE: LDH: 300 U/L — ABNORMAL HIGH (ref 98–192)

## 2020-04-02 LAB — BRAIN NATRIURETIC PEPTIDE: B Natriuretic Peptide: 330 pg/mL — ABNORMAL HIGH (ref 0.0–100.0)

## 2020-04-02 MED ORDER — FUROSEMIDE 10 MG/ML IJ SOLN
40.0000 mg | Freq: Two times a day (BID) | INTRAMUSCULAR | Status: DC
  Administered 2020-04-02 – 2020-04-03 (×2): 40 mg via INTRAVENOUS
  Filled 2020-04-02 (×2): qty 4

## 2020-04-02 MED ORDER — FUROSEMIDE 10 MG/ML IJ SOLN
40.0000 mg | Freq: Once | INTRAMUSCULAR | Status: AC
  Administered 2020-04-02: 40 mg via INTRAVENOUS
  Filled 2020-04-02: qty 4

## 2020-04-02 MED ORDER — ATORVASTATIN CALCIUM 40 MG PO TABS
80.0000 mg | ORAL_TABLET | Freq: Every day | ORAL | Status: DC
  Administered 2020-04-02 – 2020-04-05 (×4): 80 mg via ORAL
  Filled 2020-04-02 (×4): qty 2

## 2020-04-02 MED ORDER — APIXABAN 5 MG PO TABS
5.0000 mg | ORAL_TABLET | Freq: Two times a day (BID) | ORAL | Status: DC
  Administered 2020-04-02 – 2020-04-06 (×8): 5 mg via ORAL
  Filled 2020-04-02 (×8): qty 1

## 2020-04-02 MED ORDER — METOPROLOL TARTRATE 25 MG PO TABS
25.0000 mg | ORAL_TABLET | Freq: Two times a day (BID) | ORAL | Status: DC
  Administered 2020-04-02 – 2020-04-06 (×8): 25 mg via ORAL
  Filled 2020-04-02 (×8): qty 1

## 2020-04-02 MED ORDER — LATANOPROST 0.005 % OP SOLN
1.0000 [drp] | Freq: Every day | OPHTHALMIC | Status: DC
  Administered 2020-04-03: 1 [drp] via OPHTHALMIC
  Filled 2020-04-02: qty 2.5

## 2020-04-02 MED ORDER — CITALOPRAM HYDROBROMIDE 20 MG PO TABS
20.0000 mg | ORAL_TABLET | Freq: Every day | ORAL | Status: DC
  Administered 2020-04-03 – 2020-04-05 (×3): 20 mg via ORAL
  Filled 2020-04-02 (×3): qty 1

## 2020-04-02 MED ORDER — SODIUM CHLORIDE 0.9% FLUSH
3.0000 mL | Freq: Two times a day (BID) | INTRAVENOUS | Status: DC
  Administered 2020-04-03 – 2020-04-06 (×6): 3 mL via INTRAVENOUS

## 2020-04-02 MED ORDER — LISINOPRIL 20 MG PO TABS
40.0000 mg | ORAL_TABLET | Freq: Every day | ORAL | Status: DC
  Administered 2020-04-03 – 2020-04-04 (×2): 40 mg via ORAL
  Filled 2020-04-02 (×2): qty 2

## 2020-04-02 MED ORDER — SODIUM CHLORIDE 0.9 % IV SOLN: 250.0000 mL | INTRAVENOUS | Status: DC | PRN

## 2020-04-02 MED ORDER — ATROPINE SULFATE 1 % OP SOLN
1.0000 [drp] | Freq: Every day | OPHTHALMIC | Status: DC
  Administered 2020-04-03 – 2020-04-05 (×3): 1 [drp] via OPHTHALMIC
  Filled 2020-04-02: qty 2

## 2020-04-02 MED ORDER — ONDANSETRON HCL 4 MG/2ML IJ SOLN: 4.0000 mg | Freq: Four times a day (QID) | INTRAMUSCULAR | Status: DC | PRN

## 2020-04-02 MED ORDER — BRIMONIDINE TARTRATE 0.2 % OP SOLN
1.0000 [drp] | Freq: Every day | OPHTHALMIC | Status: DC
  Administered 2020-04-03 – 2020-04-06 (×4): 1 [drp] via OPHTHALMIC
  Filled 2020-04-02: qty 5

## 2020-04-02 MED ORDER — SODIUM CHLORIDE 0.9% FLUSH: 3.0000 mL | INTRAVENOUS | Status: DC | PRN

## 2020-04-02 MED ORDER — HYDRALAZINE HCL 50 MG PO TABS
50.0000 mg | ORAL_TABLET | Freq: Three times a day (TID) | ORAL | Status: DC
  Administered 2020-04-02 – 2020-04-06 (×11): 50 mg via ORAL
  Filled 2020-04-02 (×10): qty 1

## 2020-04-02 MED ORDER — LEVOTHYROXINE SODIUM 112 MCG PO TABS
112.0000 ug | ORAL_TABLET | Freq: Every day | ORAL | Status: DC
  Administered 2020-04-03 – 2020-04-06 (×4): 112 ug via ORAL
  Filled 2020-04-02 (×5): qty 1

## 2020-04-02 MED ORDER — DORZOLAMIDE HCL-TIMOLOL MAL 2-0.5 % OP SOLN
1.0000 [drp] | Freq: Two times a day (BID) | OPHTHALMIC | Status: DC
  Administered 2020-04-03 – 2020-04-06 (×7): 1 [drp] via OPHTHALMIC
  Filled 2020-04-02: qty 10

## 2020-04-02 MED ORDER — ONDANSETRON HCL 4 MG PO TABS: 4.0000 mg | ORAL_TABLET | Freq: Four times a day (QID) | ORAL | Status: DC | PRN

## 2020-04-02 MED ORDER — GABAPENTIN 100 MG PO CAPS
100.0000 mg | ORAL_CAPSULE | Freq: Every day | ORAL | Status: DC
  Administered 2020-04-03 – 2020-04-05 (×3): 100 mg via ORAL
  Filled 2020-04-02 (×3): qty 1

## 2020-04-02 NOTE — ED Notes (Signed)
Increased O2 to 5L/[place]

## 2020-04-02 NOTE — Progress Notes (Signed)
Abg results were unable to transfer through to epic.  Results called and given to Dr Roel Cluck verbally via telephone call.

## 2020-04-02 NOTE — ED Notes (Signed)
Patient's son was frustratedabout not having a ASL interpreter in person. I offered to use interpreter on wheels, but he stated that that would do no good. I told patient's son that I would get interpreter on wheels anyway just to see if it as helpful at all; son agreed to try using interpreter until we could get an interpreter in person

## 2020-04-02 NOTE — ED Notes (Signed)
Interpreter on wheels used to communicate effectively with patient. Patient's daughter [name] was contacted and notified that effective communication was made using interpreter on wheels and we will be using the interpreter on wheels until tomorrow morning. Secretary was able to get confirmation that live interpreter would be here at 0730 04/03/2020.

## 2020-04-02 NOTE — Progress Notes (Deleted)
Cardiology Office Note    Date:  04/02/2020   ID:  Veronica Young, DOB [birth year], MRN [id number]  PCP:  Katherina Mires, MD  Cardiologist:   Dr.Danial Hlavac [place] specialist: Dr. Gwenlyn Found Electrophysiologist: Dr. Lovena Le  No chief complaint on file.   History of Present Illness:  Veronica Young is a 86 y.o. female with PMH of HTN, HLD, DM II, PAD with known occluded popliteal arteries, SSS s/p PPM 0/34 and diastolic heart failure with normal EF. She is deaf and require interpreter to communicate. Her recent ABI on 05/30/2017 showed right ABI 0.46, left ABI 0.50, left TBI 0.27. She was admitted on 06/16/2017 with chest pain. This occurred in the setting of atrial tachycardia with heart rate of 150s. Thyroid and electrolyte were normal. Serial troponin was negative. Fasting lipid panel showed LDL of 125. Hemoglobin was 8.6. Echocardiogram obtained on 08/17/2017 showed EF 74-25%, grade 1 diastolic dysfunction, moderate TR, mild MR, PA peak pressure 51 mmHg. She was subsequently discharged with  an additional 25 mg metoprolol on as needed basis for recurrent episode.  She was seen by Dr. Gwenlyn Found in February. LE arterial dopplers showed bilateral popliteal occlusions and left SFA occlusion. Iliac arteries were patent. This was unchanged from prior and she had no significant claudication. No further evaluation needed.   She was seen in pacer clinic in November. Noted to have > 50 hours of Afib in October. Was started on Eliquis. On Follow up pacer check in January she was in persistent Afib with controlled rate.    She denies any chest pain. She has infrequent dyspnea at night. No increased swelling. Still has claudication symptoms R>L but thinks this hasn't changed. She does do some walking mostly in her house. She reports BP at home 956-387 systolic.   Interview was conducted via Sales executive   Past Medical History:  Diagnosis Date  . Carotid bruit   . CHF (congestive heart failure) ([place])   .  Deafness   . Diabetes mellitus    type 2  . Diastolic heart failure (HCC)    EF is 60%  . GERD (gastroesophageal reflux disease)   . Glaucoma   . Hypercholesterolemia   . Hypertension   . Hypothyroidism   . Normal cardiac stress test January 2014  . PAD (peripheral artery disease) ([place])    a. known bilateral popliteal occlusions with conservative management favored.  [name] PAT (paroxysmal atrial tachycardia) ([place])   . Sick sinus syndrome [facility])    a. s/p PPM placement 07/2014    Past Surgical History:  Procedure Laterality Date  . cholycystectomy    . left breast cyst removed    . PACEMAKER INSERTION  07-26-2014   MDT ADDRL1 pacemaker implanted by Dr Lovena Le for SSS  . PERMANENT PACEMAKER INSERTION N/A 07/26/2014   Procedure: PERMANENT PACEMAKER INSERTION;  Surgeon: Evans Lance, MD;  Location: [facility] CATH LAB;  Service: Cardiovascular;  Laterality: N/A;  . TOTAL KNEE ARTHROPLASTY     right    Current Medications: Outpatient Medications Prior to Visit  Medication Sig Dispense Refill  . apixaban (ELIQUIS) 5 MG TABS tablet Take 1 tablet (5 mg total) by mouth 2 (two) times daily. 60 tablet 5  . atorvastatin (LIPITOR) 80 MG tablet Take 80 mg by mouth at bedtime.     [name] atropine 1 % ophthalmic solution Place 1 drop into the left eye at bedtime.     . brimonidine (ALPHAGAN) 0.2 % ophthalmic solution Place 1 drop  into both eyes daily.    . calcium-vitamin D (OSCAL WITH D) 500-200 MG-UNIT per tablet Take 1 tablet by mouth daily with breakfast.    . cholecalciferol (VITAMIN D) 1000 UNITS tablet Take 1,000 Units by mouth daily.    . citalopram (CELEXA) 20 MG tablet Take 20 mg by mouth at bedtime.     . dorzolamide-timolol (COSOPT) 22.3-6.8 MG/ML ophthalmic solution Place 1 drop into both eyes 2 (two) times daily.     [name] gabapentin (NEURONTIN) 100 MG capsule Take 100 mg by mouth at bedtime.    . hydrALAZINE (APRESOLINE) 50 MG tablet Take 50 mg by mouth 3 (three) times daily.    [name] latanoprost  (XALATAN) 0.005 % ophthalmic solution Place 1 drop into both eyes daily.  12  . levothyroxine (SYNTHROID, LEVOTHROID) 112 MCG tablet Take 112 mcg by mouth daily before breakfast.     . lisinopril (ZESTRIL) 40 MG tablet TAKE 1 TABLET EVERY DAY 90 tablet 2  . metoprolol tartrate (LOPRESSOR) 25 MG tablet TAKE 1 TABLET BY MOUTH 2 TIMES DAILY. TAKE EXTRA 25MG  ( 1 TABLET) FOR PALPITATIONS AS NEEDED. 225 tablet 2   No facility-administered medications prior to visit.     Allergies:   Amlodipine and Benicar [olmesartan medoxomil]   Social History   Socioeconomic History  . Marital status: Widowed    Spouse name: Not on file  . Number of children: 4  . Years of education: Not on file  . Highest education level: Not on file  Occupational History  . Not on file  Tobacco Use  . Smoking status: Never Smoker  . Smokeless tobacco: Never Used  Substance and Sexual Activity  . Alcohol use: No  . Drug use: No  . Sexual activity: Never  Other Topics Concern  . Not on file  Social History Narrative  . Not on file   Social Determinants of Health   Financial Resource Strain:   . Difficulty of Paying Living Expenses:   Food Insecurity:   . Worried About Charity fundraiser in the Last Year:   . Arboriculturist in the Last Year:   Transportation Needs:   . Film/video editor (Medical):   [name] Lack of Transportation (Non-Medical):   Physical Activity:   . Days of Exercise per Week:   . Minutes of Exercise per Session:   Stress:   . Feeling of Stress :   Social Connections:   . Frequency of Communication with Friends and Family:   . Frequency of Social Gatherings with Friends and Family:   . Attends Religious Services:   . Active Member of Clubs or Organizations:   . Attends Archivist Meetings:   [name] Marital Status:      Family History:  The patient's family history includes Heart disease in her brother, mother, and sister; Liver cancer in her father; Stroke in her sister.    ROS:   Please see the history of present illness.    ROS All other systems reviewed and are negative.   PHYSICAL EXAM:   VS:  There were no vitals taken for this visit.   GENERAL:  Well appearing HEENT:  PERRL, EOMI, sclera are clear. Oropharynx is clear. NECK:  No jugular venous distention, carotid upstroke brisk and symmetric, no bruits, no thyromegaly or adenopathy LUNGS:  Clear to auscultation bilaterally CHEST:  Unremarkable HEART:  RRR,  PMI not displaced or sustained,S1 and S2 within normal limits, no S3, no S4: no clicks,  no rubs, no murmurs ABD:  Soft, nontender. BS +, no masses or bruits. No hepatomegaly, no splenomegaly EXT:  Reduced pedal pulses, tr edema, no cyanosis no clubbing SKIN:  Warm and dry.  No rashes NEURO:  Alert and oriented x 3. Cranial nerves II through XII intact. PSYCH:  Cognitively intact    Wt Readings from Last 3 Encounters:  02/18/20 143 lb 12.8 oz (65.2 kg)  12/13/19 139 lb (63 kg)  11/16/19 144 lb (65.3 kg)      Studies/Labs Reviewed:   EKG:  EKG is not ordered today.   Recent Labs: 11/16/2019: BUN 18; Creatinine, Ser 1.04; Potassium 4.4; Sodium 135 12/13/2019: Hemoglobin 13.6; Platelets 216   Lipid Panel    Component Value Date/Time   CHOL 179 06/17/2017 0506   TRIG 33 06/17/2017 0506   HDL 45 06/17/2017 0506   CHOLHDL 4.0 06/17/2017 0506   VLDL 7 06/17/2017 0506   LDLCALC 127 (H) 06/17/2017 0506    Additional studies/ records that were reviewed today include:    Echo 06/17/2017 LV EF: 65% - 70%  Study Conclusions  - Left ventricle: The cavity size was normal. Wall thickness was normal. Systolic function was vigorous. The estimated ejection fraction was in the range of 65% to 70%. Wall motion was normal; there were no regional wall motion abnormalities. Doppler parameters are consistent with abnormal left ventricular relaxation (grade 1 diastolic dysfunction). The E/e&' ratio is >15, suggesting  elevated LV filling pressure. - Mitral valve: Mildly thickened leaflets . There was mild regurgitation. - Left atrium: The atrium was normal in size. - Right ventricle: The cavity size was normal. Wall thickness was normal. The moderator band was prominent. Pacer wire or catheter noted in right ventricle. Systolic function was normal. - Right atrium: The atrium was normal in size. Pacer wire or catheter noted in right atrium. - Tricuspid valve: There was moderate regurgitation. - Pulmonary arteries: PA peak pressure: 51 mm Hg (S). - Inferior vena cava: The vessel was dilated. The respirophasic diameter changes were blunted (<50%), consistent with elevated central venous pressure.  Impressions:  - Compared to a prior study in 2015, there are few changes. LVEF is stable, RVSP is stable - the degree of diastolic dysfunction is less, however, LV filling pressure is elevated. Pacer wires now noted in the right heart.    ASSESSMENT:    No diagnosis found.   PLAN:  In order of problems listed above:  1. Malignant hypertension: Blood pressure is elevated today but has been well controlled at home. Previously her home  blood pressure recorder was shown to be accurate. Will continue on the current medication.  2. Hyperlipidemia: On Lipitor 80 mg daily.   3. Prediabetes  4. Peripheral artery disease: bilateral popliteal occlusions. ? aorto-iliac disease. Plan Aorto-iliac and LE arterial dopplers on 01/20/17 then follow up with Dr. Gwenlyn Found.  5. SSS s/p pacemaker: Pacer check in October looked good.   6.   Persistent Atrial fibrillation. No on Eliquis.     Medication Adjustments/Labs and Tests Ordered: Current medicines are reviewed at length with the patient today.  Concerns regarding medicines are outlined above.  Medication changes, Labs and Tests ordered today are listed in the Patient Instructions below. There are no Patient Instructions on file for this  visit.   Signed, Joncarlo Friberg [name], MD  04/02/2020 9:26 AM    [facility] [address], [place], [place]  [address] Phone: [phone]; Fax: [phone]

## 2020-04-02 NOTE — ED Provider Notes (Signed)
[place] DEPT Provider Note   CSN: [id number] Arrival date & time: 04/02/20  1511     History Chief Complaint  Patient presents with  . Shortness of Breath    Veronica Young is a 86 y.o. female with history of coronary artery disease, CHF EF 60%, paroxysmal A. fib on Eliquis, PAD, sick sinus syndrome status post pacemaker placement who presents with shortness of breath.  Her son is at bedside and since patient is deaf and mute he interprets.  For the past 3 days the patient has persistent and gradually worsening shortness of breath.  She is also been complaining of "feeling funny" but is unable to elaborate further on this.  She denies dizziness, lightheadedness, syncope, palpitations, chest pain, fever, chills, cough.  She has had some pain over the left lower rib cage but denies that currently.  She has had some swelling in her legs which she has never had before and has not noticed.  She has been taking all her medicines as prescribed.  She is not on a diuretic. Pt has had her 1st COVID vaccine.  HPI     Past Medical History:  Diagnosis Date  . Carotid bruit   . CHF (congestive heart failure) ([place])   . Deafness   . Diabetes mellitus    type 2  . Diastolic heart failure (HCC)    EF is 60%  . GERD (gastroesophageal reflux disease)   . Glaucoma   . Hypercholesterolemia   . Hypertension   . Hypothyroidism   . Normal cardiac stress test January 2014  . PAD (peripheral artery disease) ([place])    a. known bilateral popliteal occlusions with conservative management favored.  [name] PAT (paroxysmal atrial tachycardia) ([place])   . Sick sinus syndrome [facility])    a. s/p PPM placement 07/2014    Patient Active Problem List   Diagnosis Date Noted  . CAD in native artery 06/16/2017  . PAF (paroxysmal atrial fibrillation) ([place]) 06/16/2017  . Sinus node dysfunction ([place]) 09/10/2016  . Cardiac pacemaker in situ 11/08/2014  . HTN (hypertension) 09/22/2014  .  Hypertensive urgency 09/15/2014  . Chest pain 09/15/2014  . Chronic diastolic CHF (congestive heart failure) ([place]) 11/27/2012  . PAD (peripheral artery disease) ([place]) 03/18/2012  . Claudication ([place]) 02/19/2012  . Atrial tachycardia ([place]) 06/27/2011  . Hypercholesterolemia   . Deafness   . Carotid bruit   . GERD (gastroesophageal reflux disease)   . Hypothyroidism     Past Surgical History:  Procedure Laterality Date  . cholycystectomy    . left breast cyst removed    . PACEMAKER INSERTION  07-26-2014   MDT ADDRL1 pacemaker implanted by Dr Lovena Le for SSS  . PERMANENT PACEMAKER INSERTION N/A 07/26/2014   Procedure: PERMANENT PACEMAKER INSERTION;  Surgeon: Evans Lance, MD;  Location: [facility] CATH LAB;  Service: Cardiovascular;  Laterality: N/A;  . TOTAL KNEE ARTHROPLASTY     right     OB History   No obstetric history on file.     Family History  Problem Relation Age of Onset  . Heart disease Mother   . Liver cancer Father   . Heart disease Sister   . Heart disease Brother   . Stroke Sister     Social History   Tobacco Use  . Smoking status: Never Smoker  . Smokeless tobacco: Never Used  Substance Use Topics  . Alcohol use: No  . Drug use: No    Home Medications Prior to  Admission medications   Medication Sig Start Date End Date Taking? Authorizing Provider  apixaban (ELIQUIS) 5 MG TABS tablet Take 1 tablet (5 mg total) by mouth 2 (two) times daily. 02/09/20   Evans Lance, MD  atorvastatin (LIPITOR) 80 MG tablet Take 80 mg by mouth at bedtime.     [provider]  atropine 1 % ophthalmic solution Place 1 drop into the left eye at bedtime.     [provider]  brimonidine (ALPHAGAN) 0.2 % ophthalmic solution Place 1 drop into both eyes daily. 05/20/17   [provider]  calcium-vitamin D (OSCAL WITH D) 500-200 MG-UNIT per tablet Take 1 tablet by mouth daily with breakfast.    [provider]  cholecalciferol (VITAMIN D) 1000 UNITS  tablet Take 1,000 Units by mouth daily.    [provider]  citalopram (CELEXA) 20 MG tablet Take 20 mg by mouth at bedtime.  02/08/11   [provider]  dorzolamide-timolol (COSOPT) 22.3-6.8 MG/ML ophthalmic solution Place 1 drop into both eyes 2 (two) times daily.     [provider]  gabapentin (NEURONTIN) 100 MG capsule Take 100 mg by mouth at bedtime. 10/24/19   [provider]  hydrALAZINE (APRESOLINE) 50 MG tablet Take 50 mg by mouth 3 (three) times daily.    [provider]  latanoprost (XALATAN) 0.005 % ophthalmic solution Place 1 drop into both eyes daily. 11/25/15   [provider]  levothyroxine (SYNTHROID, LEVOTHROID) 112 MCG tablet Take 112 mcg by mouth daily before breakfast.  03/31/14   [provider]  lisinopril (ZESTRIL) 40 MG tablet TAKE 1 TABLET EVERY DAY 05/24/19   [name], Peter M, MD  metoprolol tartrate (LOPRESSOR) 25 MG tablet TAKE 1 TABLET BY MOUTH 2 TIMES DAILY. TAKE EXTRA 25MG  ( 1 TABLET) FOR PALPITATIONS AS NEEDED. 11/30/18   [name], Peter M, MD    Allergies    Amlodipine and Benicar [olmesartan medoxomil]  Review of Systems   Review of Systems  Constitutional: Negative for chills and fever.  Respiratory: Positive for chest tightness and shortness of breath. Negative for cough.   Cardiovascular: Positive for chest pain (left sided rib) and leg swelling. Negative for palpitations.  Gastrointestinal: Negative for nausea and vomiting.  Neurological: Negative for syncope and light-headedness.  All other systems reviewed and are negative.   Physical Exam Updated Vital Signs BP (!) 163/112 (BP Location: Right Arm)   Pulse 89   Temp (!) 97.5 F (36.4 C) (Oral)   Resp (!) 8   SpO2 94%   Physical Exam Vitals and nursing note reviewed.  Constitutional:      General: She is not in acute distress.    Appearance: Normal appearance. She is well-developed. She is not ill-appearing.     Comments: On 2L via [place]   HENT:     Head: Normocephalic and atraumatic.  Eyes:     General: No scleral icterus.       Right eye: No discharge.        Left eye: No discharge.     Conjunctiva/sclera: Conjunctivae normal.     Pupils: Pupils are equal, round, and reactive to light.  Cardiovascular:     Rate and Rhythm: Normal rate and regular rhythm.  Pulmonary:     Effort: Pulmonary effort is normal. No respiratory distress.     Breath sounds: Normal breath sounds.  Abdominal:     General: There is no distension.     Palpations: Abdomen is soft.  Tenderness: There is no abdominal tenderness.  Musculoskeletal:     Cervical back: Normal range of motion.     Right lower leg: Edema present.     Left lower leg: Edema present.     Comments: 2+ pitting edema bilaterally  Skin:    General: Skin is warm and dry.  Neurological:     Mental Status: She is alert and oriented to person, place, and time.  Psychiatric:        Behavior: Behavior normal.     ED Results / Procedures / Treatments   Labs (all labs ordered are listed, but only abnormal results are displayed) Labs Reviewed  SARS CORONAVIRUS 2 (TAT 6-24 HRS)  CBC WITH DIFFERENTIAL/PLATELET  BASIC METABOLIC PANEL  BRAIN NATRIURETIC PEPTIDE  TROPONIN I (HIGH SENSITIVITY)    EKG None  Radiology DG Chest 2 View  Result Date: 04/02/2020 CLINICAL DATA:  Shortness of breath EXAM: CHEST - 2 VIEW COMPARISON:  06/16/2017 FINDINGS: Cardiomegaly with pulmonary vascular congestion and possible mild interstitial edema. Mild patchy bilateral lower lobe opacities, atelectasis versus pneumonia. Suspected trace right pleural effusion. No pneumothorax. Left subclavian pacemaker. IMPRESSION: Cardiomegaly with possible mild interstitial edema and trace right pleural effusion. Mild patchy bilateral lower lobe opacities, atelectasis versus pneumonia. Electronically Signed   By: Julian Hy M.D.   On: 04/02/2020 16:12    Procedures Procedures (including critical  care time)  Medications Ordered in ED Medications  furosemide (LASIX) injection 40 mg (40 mg Intravenous Given 04/02/20 1736)    ED Course  I have reviewed the triage vital signs and the nursing notes.  Pertinent labs & imaging results that were available during my care of the patient were reviewed by me and considered in my medical decision making (see chart for details).  86 year old female presents with increasing shortness of breath and leg swelling for the past several days.  Patient has extensive heart history.  She is hypoxic in triage and requiring 3 L via nasal cannula.  She is in rate controlled A. fib.  Lung sounds clear to auscultation.  She has pitting edema of the bilateral lower extremities.  Chest x-ray was obtained which shows cardiomegaly with mild interstitial edema, trace right pleural effusion, patchy bilateral lower lobe opacities.  There is no report of fever or cough so lower suspicion for pneumonia. Lasix ordered. COVID test ordered.  At shift change labs are pending.  Anticipate admission for diuresis.  Care signed out to Dr. Vanita Panda.  MDM Rules/Calculators/A&P                       Final Clinical Impression(s) / ED Diagnoses Final diagnoses:  Acute on chronic congestive heart failure, unspecified heart failure type [facility])    Rx / DC Orders ED Discharge Orders    None       Recardo Evangelist, PA-C 04/02/20 1749    Carmin Muskrat, MD 04/02/20 2122

## 2020-04-02 NOTE — H&P (Addendum)
Veronica Young ZJQ:[id number] DOB: 12/03/1933 DOA: 04/02/2020    PCP: Katherina Mires, MD   Outpatient Specialists:   CARDS:   Dr. Berry Peter [name], MD Electrophysiologist: Dr. Lovena Le     Patient arrived to ER on 04/02/20 at 1511  Patient coming from: home Lives alone,      Chief Complaint:  Chief Complaint  Patient presents with  . Shortness of Breath    HPI: Veronica Young is a 86 y.o. female with medical history significant of deafness,  hypertension, hyperlipidemia, permanent transvenous pacemaker insertion 8/15, paroxysmal atrial fibrillation on Eliquis, and diastolic heart failure with a normal EF, PAD  anemia, chronic back pain   Presented with   3 d hx of gradual worsening shortness of breath no dizziness no lightheadedness did not syncopized no palpitations denies any chest pain no fevers or chills reports bilateral lower extremity swelling She did report taking medications.  Infectious risk factors:  Reports  shortness of breath,     Has  been vaccinated against COVID x2 end of  March   in house  PCR testing  Pending  No results found for: SARSCOV2NAA   Regarding pertinent Chronic problems:   PAD   followed by Dr. Gwenlyn Found    Hyperlipidemia -  on statins Lipitor   HTN on hydralazine, lisinopril, lopressor   chronic CHF diastolic  - last echo 2409 LV EF: 65% -  70%  (grade 1 diastolic dysfunction).    Hypothyroidism:  Lab Results  Component Value Date   TSH 3.889 06/17/2017   on synthroid    A. Fib -  - CHA2DS2 vas score >3:  current  on anticoagulation with   Eliquis,           -  Rate control:  Currently controlled with   Metoprolol    CKD stage II - baseline Cr 1.1 Lab Results  Component Value Date   CREATININE 1.15 (H) 04/02/2020   CREATININE 1.04 (H) 11/16/2019   CREATININE 1.15 (H) 06/17/2017    While in ER: Noted to have evidence of CHF with elevated BNP and hypoxia needing o2 5 L and CXR showing fluid overload was treated with lasix  IV Patient was noted to have pitting edema bilaterally  Hospitalist was called for admission for CHF exacerbation  The following Work up has been ordered so far:  Orders Placed This Encounter  Procedures  . SARS CORONAVIRUS 2 (TAT 6-24 HRS) Nasopharyngeal Nasopharyngeal Swab  . DG Chest 2 View  . Basic metabolic panel  . Brain natriuretic peptide  . CBC with Differential  . If O2 Sat <94% administer O2 at 2 liters/minute via nasal cannula  . Cardiac monitoring  . Consult to hospitalist  ALL PATIENTS BEING ADMITTED/HAVING PROCEDURES NEED COVID-19 SCREENING  . Pulse oximetry, continuous  . ED EKG  . Insert peripheral IV    Following Medications were ordered in ER: Medications  furosemide (LASIX) injection 40 mg (40 mg Intravenous Given 04/02/20 1736)        Consult Orders  (From admission, onward)         Start     Ordered   04/02/20 1816  Consult to hospitalist  ALL PATIENTS BEING ADMITTED/HAVING PROCEDURES NEED COVID-19 SCREENING  Once    Comments: ALL PATIENTS BEING ADMITTED/HAVING PROCEDURES NEED COVID-19 SCREENING  Provider:  (Not yet assigned)  Question Answer Comment  Place call to: [facility]   Reason for Consult Admit      04/02/20  1815          Significant initial  Findings: Abnormal Labs Reviewed  BASIC METABOLIC PANEL - Abnormal; Notable for the following components:      Result Value   Glucose, Bld 117 (*)    Creatinine, Ser 1.15 (*)    GFR calc non Af Amer 44 (*)    GFR calc Af Amer 51 (*)    All other components within normal limits  BRAIN NATRIURETIC PEPTIDE - Abnormal; Notable for the following components:   B Natriuretic Peptide 330.0 (*)    All other components within normal limits   Otherwise labs showing:  Recent Labs  Lab 04/02/20 1651  NA 136  K 3.9  CO2 23  GLUCOSE 117*  BUN 22  CREATININE 1.15*  CALCIUM 9.7    Cr    Stable,  Lab Results  Component Value Date   CREATININE 1.15 (H) 04/02/2020   CREATININE 1.04 (H)  11/16/2019   CREATININE 1.15 (H) 06/17/2017    No results for input(s): AST, ALT, ALKPHOS, BILITOT, PROT, ALBUMIN in the last 168 hours. Lab Results  Component Value Date   CALCIUM 9.7 04/02/2020   WBC       Component Value Date/Time   WBC 6.3 04/02/2020 1651   ANC    Component Value Date/Time   NEUTROABS 4.7 04/02/2020 1651     Plt: Lab Results  Component Value Date   PLT 200 04/02/2020    Procalcitonin Ordered   COVID-19 Labs  Recent Labs    04/02/20 1941 04/02/20 1945  DDIMER 1.47*  --   LDH 300*  --   CRP  --  0.8    No results found for: SARSCOV2NAA   HG/HCT   Stable,     Component Value Date/Time   HGB 14.0 04/02/2020 1651   HGB 13.6 12/13/2019 1154   HCT 44.1 04/02/2020 1651   HCT 41.4 12/13/2019 1154   ABG pH 7.43/36.1/87.2  No results for input(s): LIPASE, AMYLASE in the last 168 hours. No results for input(s): AMMONIA in the last 168 hours.  No components found for: LABALBU  Troponin 6   ECG: Ordered Personally reviewed by me showing: HR : 82 Rhythm: A.fib    no evidence of ischemic changes QTC 487   BNP (last 3 results) Recent Labs    04/02/20 1651  BNP 330.0*     UA not ordered        Ordered   CXR - cardiomegaly w mild interstitial edema, bilateral lobe opacities     ED Triage Vitals  Enc Vitals Group     BP 04/02/20 1523 (!) 163/112     Pulse Rate 04/02/20 1523 89     Resp 04/02/20 1523 (!) 8     Temp 04/02/20 1523 (!) 97.5 F (36.4 C)     Temp Source 04/02/20 1523 Oral     SpO2 04/02/20 1523 94 %     Weight --      Height --      Head Circumference --      Peak Flow --      Pain Score 04/02/20 1530 4     Pain Loc --      Pain Edu? --      Excl. in [place]? --   TMAX(24)@       Latest  Blood pressure (!) 188/109, pulse 78, temperature (!) 97.5 F (36.4 C), temperature source Oral, resp. rate (!) 21, SpO2 90 %.  Review of Systems:    Pertinent positives include: shortness of breath at rest.   dyspnea on  exertion,  Constitutional:  No weight loss, night sweats, Fevers, chills, fatigue, weight loss  HEENT:  No headaches, Difficulty swallowing,Tooth/dental problems,Sore throat,  No sneezing, itching, ear ache, nasal congestion, post nasal drip,  Cardio-vascular:  No chest pain, Orthopnea, PND, anasarca, dizziness, palpitations.no Bilateral lower extremity swelling  GI:  No heartburn, indigestion, abdominal pain, nausea, vomiting, diarrhea, change in bowel habits, loss of appetite, melena, blood in stool, hematemesis Resp:  no No excess mucus, no productive cough, No non-productive cough, No coughing up of blood.No change in color of mucus.No wheezing. Skin:  no rash or lesions. No jaundice GU:  no dysuria, change in color of urine, no urgency or frequency. No straining to urinate.  No flank pain.  Musculoskeletal:  No joint pain or no joint swelling. No decreased range of motion. No back pain.  Psych:  No change in mood or affect. No depression or anxiety. No memory loss.  Neuro: no localizing neurological complaints, no tingling, no weakness, no double vision, no gait abnormality, no slurred speech, no confusion  All systems reviewed and apart from [place] all are negative  Past Medical History:   Past Medical History:  Diagnosis Date  . Carotid bruit   . CHF (congestive heart failure) ([place])   . Deafness   . Diabetes mellitus    type 2  . Diastolic heart failure (HCC)    EF is 60%  . GERD (gastroesophageal reflux disease)   . Glaucoma   . Hypercholesterolemia   . Hypertension   . Hypothyroidism   . Normal cardiac stress test January 2014  . PAD (peripheral artery disease) ([place])    a. known bilateral popliteal occlusions with conservative management favored.  [name] PAT (paroxysmal atrial tachycardia) ([place])   . Sick sinus syndrome [facility])    a. s/p PPM placement 07/2014      Past Surgical History:  Procedure Laterality Date  . cholycystectomy    . left breast cyst removed     . PACEMAKER INSERTION  07-26-2014   MDT ADDRL1 pacemaker implanted by Dr Lovena Le for SSS  . PERMANENT PACEMAKER INSERTION N/A 07/26/2014   Procedure: PERMANENT PACEMAKER INSERTION;  Surgeon: Evans Lance, MD;  Location: [facility] CATH LAB;  Service: Cardiovascular;  Laterality: N/A;  . TOTAL KNEE ARTHROPLASTY     right    Social History:  Ambulatory   independently       reports that she has never smoked. She has never used smokeless tobacco. She reports that she does not drink alcohol or use drugs.   Family History:   Family History  Problem Relation Age of Onset  . Heart disease Mother   . Liver cancer Father   . Heart disease Sister   . Heart disease Brother   . Stroke Sister     Allergies: Allergies  Allergen Reactions  . Amlodipine Nausea Only       . Benicar [Olmesartan Medoxomil] Nausea Only     Prior to Admission medications   Medication Sig Start Date End Date Taking? Authorizing Provider  apixaban (ELIQUIS) 5 MG TABS tablet Take 1 tablet (5 mg total) by mouth 2 (two) times daily. 02/09/20   Evans Lance, MD  atorvastatin (LIPITOR) 80 MG tablet Take 80 mg by mouth at bedtime.     [provider]  atropine 1 % ophthalmic solution Place 1 drop into the left eye at  bedtime.     [provider]  brimonidine (ALPHAGAN) 0.2 % ophthalmic solution Place 1 drop into both eyes daily. 05/20/17   [provider]  calcium-vitamin D (OSCAL WITH D) 500-200 MG-UNIT per tablet Take 1 tablet by mouth daily with breakfast.    [provider]  cholecalciferol (VITAMIN D) 1000 UNITS tablet Take 1,000 Units by mouth daily.    [provider]  citalopram (CELEXA) 20 MG tablet Take 20 mg by mouth at bedtime.  02/08/11   [provider]  dorzolamide-timolol (COSOPT) 22.3-6.8 MG/ML ophthalmic solution Place 1 drop into both eyes 2 (two) times daily.     [provider]  gabapentin (NEURONTIN) 100 MG capsule Take 100 mg by mouth at  bedtime. 10/24/19   [provider]  hydrALAZINE (APRESOLINE) 50 MG tablet Take 50 mg by mouth 3 (three) times daily.    [provider]  latanoprost (XALATAN) 0.005 % ophthalmic solution Place 1 drop into both eyes daily. 11/25/15   [provider]  levothyroxine (SYNTHROID, LEVOTHROID) 112 MCG tablet Take 112 mcg by mouth daily before breakfast.  03/31/14   [provider]  lisinopril (ZESTRIL) 40 MG tablet TAKE 1 TABLET EVERY DAY 05/24/19   [name], Peter M, MD  metoprolol tartrate (LOPRESSOR) 25 MG tablet TAKE 1 TABLET BY MOUTH 2 TIMES DAILY. TAKE EXTRA 25MG  ( 1 TABLET) FOR PALPITATIONS AS NEEDED. 11/30/18   [name], Peter M, MD   Physical Exam: Blood pressure (!) 188/109, pulse 78, temperature (!) 97.5 F (36.4 C), temperature source Oral, resp. rate (!) 21, SpO2 90 %. 1. General:  in No Acute distress    Chronically ill -appearing 2. Psychological: Alert and Oriented 3. Head/ENT:   Moist Mucous Membranes                          Head Non traumatic, neck supple                        Poor Dentition 4. SKIN:   decreased Skin turgor,  Skin clean Dry and intact no rash 5. Heart: Regular rate and rhythm no  Murmur, no Rub or gallop 6. Lungs:  no wheezes or crackles   7. Abdomen: Soft,  non-tender, Non distended  bowel sounds present 8. Lower extremities: no clubbing, cyanosis, no  edema 9. Neurologically Grossly intact, moving all 4 extremities equally   10. MSK: Normal range of motion   All other LABS:     Recent Labs  Lab 04/02/20 1651  WBC 6.3  NEUTROABS 4.7  HGB 14.0  HCT 44.1  MCV 92.1  PLT 200     Recent Labs  Lab 04/02/20 1651  NA 136  K 3.9  CL 104  CO2 23  GLUCOSE 117*  BUN 22  CREATININE 1.15*  CALCIUM 9.7     No results for input(s): AST, ALT, ALKPHOS, BILITOT, PROT, ALBUMIN in the last 168 hours.     Cultures:    Component Value Date/Time   SDES URINE, CLEAN CATCH 11/27/2012 1120   SPECREQUEST NONE 11/27/2012 1120    CULT INSIGNIFICANT GROWTH 11/27/2012 1120   REPTSTATUS 11/28/2012 FINAL 11/27/2012 1120     Radiological Exams on Admission: DG Chest 2 View  Result Date: 04/02/2020 CLINICAL DATA:  Shortness of breath EXAM: CHEST - 2 VIEW COMPARISON:  06/16/2017 FINDINGS: Cardiomegaly with pulmonary vascular congestion and possible mild interstitial edema. Mild patchy bilateral lower lobe  opacities, atelectasis versus pneumonia. Suspected trace right pleural effusion. No pneumothorax. Left subclavian pacemaker. IMPRESSION: Cardiomegaly with possible mild interstitial edema and trace right pleural effusion. Mild patchy bilateral lower lobe opacities, atelectasis versus pneumonia. Electronically Signed   By: Julian Hy M.D.   On: 04/02/2020 16:12    Chart has been reviewed    Assessment/Plan   86 y.o. female with medical history significant of deafness,  hypertension, hyperlipidemia, permanent transvenous pacemaker insertion 8/15, paroxysmal atrial fibrillation on Eliquis, and diastolic heart failure with a normal EF, PAD  anemia, chronic back pain  Admitted for acute respiratory failure  Present on Admission: . Acute respiratory failure with hypoxia (HCC) - most likely due to CHF exacerbation, will diurese, Pt currently on 6L of O2 obtain Echo Pt on anticoagulation will continue, PE less likely  Covid pending has been fully vaccinated No clinical picture of PNA Given severity of hypoxia will check ABG  . Acute on chronic diastolic CHF (congestive heart failure) ([place]) -  - admit on telemetry,  cycle cardiac enzymes, Troponin Invalid input(s): TROP  obtain serial ECG  to evaluate for ischemia as a cause of heart failure  monitor daily weight: There were no vitals filed for this visit. Last BNP BNP (last 3 results) Recent Labs    04/02/20 1651  BNP 330.0*    ProBNP (last 3 results) No results for input(s): PROBNP in the last 8760 hours.   diurese with IV lasix and monitor orthostatics  and creatinine to avoid over diuresis.  Order echogram to evaluate EF and valves  ACE/ARBi ordered    cardiology consulted epic email   . Hypercholesterolemia -  chronic cont Lipitor  . GERD (gastroesophageal reflux disease) - continue Protonix  . PAD (peripheral artery disease) (HCC) - currently stable   . HTN (hypertension) - restart home medications  . PAF (paroxysmal atrial fibrillation) (HCC) -           - CHA2DS2 vas score >3: continue current anticoagulation with  Eliquis,           -  Rate controled:  Currently controlled with Metoprolol   . Hypothyroidism -   Check TSH continue home medications at current dose    Other plan as per orders.  DVT prophylaxis: eliquis  Code Status: DNR/DNI as per patient  I had personally discussed CODE STATUS with patient   Family Communication:   Family not at  Bedside  plan of care was discussed  with   Son   Disposition Plan:     To home once workup is complete and patient is stable   Following barriers for discharge:                                                       Will likely need home health, home O2, set up                           Will need consultants to evaluate patient prior to discharge                      Would benefit from PT/OT eval prior to DC  Ordered  Consults called: emailed Cardiology    Admission status:  ED Disposition    ED Disposition Condition [place]: [place] [[id number]]  Level of Care: Stepdown [14]  Admit to SDU based on following criteria: Cardiac Instability:  Patients experiencing chest pain, unconfirmed MI and stable, arrhythmias and CHF requiring medical management and potentially compromising patient's stability  May admit patient to [facility] or [facility] if equivalent level of care is available:: No  Covid Evaluation: Symptomatic Person Under Investigation (PUI)  Diagnosis: Acute respiratory failure with  hypoxia [facility]) [[id number]]  Admitting Physician: Toy Baker [[id number]]  Attending Physician: Toy Baker [[id number]]  Estimated length of stay: past midnight tomorrow  Certification:: I certify this patient will need inpatient services for at least 2 midnights         inpatient     I Expect 2 midnight stay secondary to severity of patient's current illness need for inpatient interventions justified by the following:  hemodynamic instability despite optimal treatment ( hypoxia)   Severe lab/radiological/exam abnormalities including:   cHF vs bilateral infiltrates and extensive comorbidities including:  Chronic pain  CHF .   Chronic anticoagulation  That are currently affecting medical management.   I expect  patient to be hospitalized for 2 midnights requiring inpatient medical care.  Patient is at high risk for adverse outcome (such as loss of life or disability) if not treated.  Indication for inpatient stay as follows:    Hemodynamic instability despite maximal medical therapy,    New or worsening hypoxia  Need for IV diuretics    Level of care         SDU tele indefinitely please discontinue once patient no longer qualifies   Precautions: admitted as  PUI   No active isolations  If Covid PCR is negative  - please DC precautions     PPE: Used by the provider:   P100  eye Goggles,  Gloves      Aliha Diedrich 04/02/2020, 8:47 PM    [facility]     after 2 AM please page floor coverage PA If 7AM-7PM, please contact the day team taking care of the patient using [email or web address]   Patient was evaluated in the context of the global COVID-19 pandemic, which necessitated consideration that the patient might be at risk for infection with the SARS-CoV-2 virus that causes COVID-19. Institutional protocols and algorithms that pertain to the evaluation of patients at risk for COVID-19 are in a state of rapid change based on information released by regulatory  bodies including the CDC and federal and state organizations. These policies and algorithms were followed during the patient's care.

## 2020-04-02 NOTE — ED Notes (Signed)
Placed purewick on patient  

## 2020-04-02 NOTE — ED Notes (Signed)
Received call from sing language interpreter they are looking for someone to come in with pt at this time

## 2020-04-02 NOTE — ED Notes (Signed)
Called and left message for sign language interpreter. Waiting on call back.

## 2020-04-02 NOTE — ED Notes (Addendum)
PT placed on nasal cannula oxygen via 2 Liters per minute to oxygen saturation % was less than 94, PT oxygen improved to upper 95-97%, will continue to monitor.

## 2020-04-02 NOTE — ED Triage Notes (Signed)
Patient here from home reporting SOB that started yesterday. Denies chest pain. Reports that she has had vaccine.

## 2020-04-02 NOTE — ED Notes (Signed)
Patient reports it feels like her heart is racing

## 2020-04-02 NOTE — ED Notes (Signed)
Patient states she feels "funny'. I asked her to explain what she is feeling, she said it's difficult to explain, but she does have a headache. Denies dizziness, denies chest pain, denies any other pain except headache.

## 2020-04-03 ENCOUNTER — Inpatient Hospital Stay (HOSPITAL_COMMUNITY): Payer: Medicare HMO

## 2020-04-03 DIAGNOSIS — R609 Edema, unspecified: Secondary | ICD-10-CM

## 2020-04-03 DIAGNOSIS — I5031 Acute diastolic (congestive) heart failure: Secondary | ICD-10-CM

## 2020-04-03 DIAGNOSIS — I480 Paroxysmal atrial fibrillation: Secondary | ICD-10-CM

## 2020-04-03 DIAGNOSIS — R0902 Hypoxemia: Secondary | ICD-10-CM

## 2020-04-03 LAB — SARS CORONAVIRUS 2 (TAT 6-24 HRS): SARS Coronavirus 2: NEGATIVE

## 2020-04-03 LAB — CBC
HCT: 42.8 % (ref 36.0–46.0)
Hemoglobin: 13.6 g/dL (ref 12.0–15.0)
MCH: 28.9 pg (ref 26.0–34.0)
MCHC: 31.8 g/dL (ref 30.0–36.0)
MCV: 91.1 fL (ref 80.0–100.0)
Platelets: 182 10*3/uL (ref 150–400)
RBC: 4.7 MIL/uL (ref 3.87–5.11)
RDW: 15.4 % (ref 11.5–15.5)
WBC: 6.2 10*3/uL (ref 4.0–10.5)
nRBC: 0 % (ref 0.0–0.2)

## 2020-04-03 LAB — COMPREHENSIVE METABOLIC PANEL
ALT: 28 U/L (ref 0–44)
AST: 34 U/L (ref 15–41)
Albumin: 3.8 g/dL (ref 3.5–5.0)
Alkaline Phosphatase: 115 U/L (ref 38–126)
Anion gap: 13 (ref 5–15)
BUN: 21 mg/dL (ref 8–23)
CO2: 28 mmol/L (ref 22–32)
Calcium: 9.1 mg/dL (ref 8.9–10.3)
Chloride: 98 mmol/L (ref 98–111)
Creatinine, Ser: 1.19 mg/dL — ABNORMAL HIGH (ref 0.44–1.00)
GFR calc Af Amer: 49 mL/min — ABNORMAL LOW (ref >60)
GFR calc non Af Amer: 42 mL/min — ABNORMAL LOW (ref >60)
Glucose, Bld: 102 mg/dL — ABNORMAL HIGH (ref 70–99)
Potassium: 3.3 mmol/L — ABNORMAL LOW (ref 3.5–5.1)
Sodium: 139 mmol/L (ref 135–145)
Total Bilirubin: 1.2 mg/dL (ref 0.3–1.2)
Total Protein: 7.2 g/dL (ref 6.5–8.1)

## 2020-04-03 LAB — TSH: TSH: 5.569 u[IU]/mL — ABNORMAL HIGH (ref 0.350–4.500)

## 2020-04-03 LAB — ECHOCARDIOGRAM COMPLETE
Height: 65 in
Weight: 2140.8 oz

## 2020-04-03 LAB — MAGNESIUM: Magnesium: 1.8 mg/dL (ref 1.7–2.4)

## 2020-04-03 LAB — TROPONIN I (HIGH SENSITIVITY)
Troponin I (High Sensitivity): 8 ng/L (ref <18)
Troponin I (High Sensitivity): 9 ng/L (ref <18)

## 2020-04-03 LAB — PROCALCITONIN: Procalcitonin: <0.1 ng/mL

## 2020-04-03 LAB — PHOSPHORUS: Phosphorus: 4 mg/dL (ref 2.5–4.6)

## 2020-04-03 MED ORDER — FUROSEMIDE 10 MG/ML IJ SOLN
40.0000 mg | Freq: Once | INTRAMUSCULAR | Status: AC
  Administered 2020-04-03: 40 mg via INTRAVENOUS
  Filled 2020-04-03: qty 4

## 2020-04-03 MED ORDER — FUROSEMIDE 10 MG/ML IJ SOLN
80.0000 mg | Freq: Once | INTRAMUSCULAR | Status: AC
  Administered 2020-04-03: 80 mg via INTRAVENOUS
  Filled 2020-04-03: qty 8

## 2020-04-03 MED ORDER — POTASSIUM CHLORIDE CRYS ER 20 MEQ PO TBCR
40.0000 meq | EXTENDED_RELEASE_TABLET | Freq: Every day | ORAL | Status: DC
  Administered 2020-04-03 – 2020-04-04 (×2): 40 meq via ORAL
  Filled 2020-04-03 (×2): qty 2

## 2020-04-03 MED ORDER — ACETAMINOPHEN 325 MG PO TABS
650.0000 mg | ORAL_TABLET | Freq: Four times a day (QID) | ORAL | Status: DC | PRN
  Administered 2020-04-03 – 2020-04-05 (×3): 650 mg via ORAL
  Filled 2020-04-03 (×3): qty 2

## 2020-04-03 MED ORDER — POTASSIUM CHLORIDE CRYS ER 20 MEQ PO TBCR
40.0000 meq | EXTENDED_RELEASE_TABLET | Freq: Once | ORAL | Status: AC
  Administered 2020-04-03: 40 meq via ORAL
  Filled 2020-04-03: qty 2

## 2020-04-03 MED ORDER — MAGNESIUM SULFATE 2 GM/50ML IV SOLN
2.0000 g | Freq: Once | INTRAVENOUS | Status: AC
  Administered 2020-04-03: 2 g via INTRAVENOUS
  Filled 2020-04-03: qty 50

## 2020-04-03 MED ORDER — POTASSIUM CHLORIDE CRYS ER 20 MEQ PO TBCR: 20.0000 meq | EXTENDED_RELEASE_TABLET | Freq: Once | ORAL | Status: DC

## 2020-04-03 NOTE — ED Notes (Signed)
Patient's daughter, Tora Duck, updated about patient being moved upstairs.

## 2020-04-03 NOTE — Progress Notes (Signed)
Per Dr. Tyrell Antonio and [facility], patient's daughter [name] is permitted to stay overnight to interpret for the patient.

## 2020-04-03 NOTE — Progress Notes (Signed)
  Echocardiogram 2D Echocardiogram has been performed.  Dhwani Venkatesh A Lorann Tani 04/03/2020, 11:46 AM

## 2020-04-03 NOTE — Progress Notes (Signed)
Pt arrived to unit via ed stretcher wearing 10L of O2. Pt does not seem to be in distress vss no needs expressed at this time. Bed in lowest position and locked with call bell within reach will continue to monitor.

## 2020-04-03 NOTE — Evaluation (Signed)
Occupational Therapy Evaluation Patient Details Name: Veronica Young MRN: [id number] DOB: 1933-02-09 Today's Date: 04/03/2020    History of Present Illness 86 yo femal admitted with acute respiratory failure possibly due to HF. Hx of deafness, CAD, CHF, A fib, PAD, SSS-pacemaker, DM   Clinical Impression   Pt admitted with ARF. Pt currently with functional limitations due to the deficits listed below (see OT Problem List).  Pt will benefit from skilled OT to increase their safety and independence with ADL and functional mobility for ADL to facilitate discharge to venue listed below.   Pt is deaf- does speak a little.  This OT able to finger spell for communication also     Follow Up Recommendations  Home health OT;Supervision/Assistance - 24 hour    Equipment Recommendations  None recommended by OT    Recommendations for Other Services       Precautions / Restrictions Precautions Precautions: Fall Precaution Comments: monitor O2 Restrictions Weight Bearing Restrictions: No      Mobility Bed Mobility Overal bed mobility: Needs Assistance Bed Mobility: Supine to Sit;Sit to Supine     Supine to sit: Supervision;HOB elevated Sit to supine: Supervision;HOB elevated      Transfers Overall transfer level: Needs assistance   Transfers: Sit to/from Stand Sit to Stand: Min assist         General transfer comment: Assist to rise, steady, control descent. Cues for hand placement.    Balance Overall balance assessment: Needs assistance         Standing balance support: Single extremity supported Standing balance-Leahy Scale: Poor                             ADL either performed or assessed with clinical judgement   ADL Overall ADL's : Needs assistance/impaired Eating/Feeding: Set up;Sitting   Grooming: Set up;Sitting   Upper Body Bathing: Set up;Sitting   Lower Body Bathing: Moderate assistance;Sit to/from stand;Cueing for safety;Cueing for  compensatory techniques   Upper Body Dressing : Minimal assistance;Sitting   Lower Body Dressing: Maximal assistance;Sit to/from stand;Cueing for safety;Cueing for compensatory techniques;Cueing for sequencing   Toilet Transfer: Moderate assistance;Cueing for safety;Stand-pivot;Cueing for sequencing   Toileting- Clothing Manipulation and Hygiene: Moderate assistance;Sit to/from stand;Cueing for safety;Cueing for compensatory techniques         General ADL Comments: daughter will provide A as needed     Vision Patient Visual Report: No change from baseline       Perception     Praxis      Pertinent Vitals/Pain Pain Assessment: No/denies pain     Hand Dominance     Extremity/Trunk Assessment Upper Extremity Assessment Upper Extremity Assessment: Generalized weakness   Lower Extremity Assessment Lower Extremity Assessment: Generalized weakness   Cervical / Trunk Assessment Cervical / Trunk Assessment: Normal   Communication Communication Communication: Deaf   Cognition Arousal/Alertness: Awake/alert Behavior During Therapy: WFL for tasks assessed/performed Overall Cognitive Status: Difficult to assess                                 General Comments: appears WFL with use of interpreter   General Comments               Home Living Family/patient expects to be discharged to:: Private residence Living Arrangements: Children Available Help at Discharge: Family;Available 24 hours/day Type of Home: House Home Access: Stairs to enter  Entrance Stairs-Number of Steps: 1   Home Layout: One level               Home Equipment: None          Prior Functioning/Environment Level of Independence: Independent                 OT Problem List: Decreased strength;Decreased activity tolerance;Impaired balance (sitting and/or standing);Decreased safety awareness      OT Treatment/Interventions: Self-care/ADL training;Patient/family  education;DME and/or AE instruction    OT Goals(Current goals can be found in the care plan section) Acute Rehab OT Goals Patient Stated Goal: to get better/stronger OT Goal Formulation: With patient Time For Goal Achievement: 04/17/20 Potential to Achieve Goals: Good  OT Frequency: Min 2X/week    AM-PAC OT "6 Clicks" Daily Activity     Outcome Measure Help from another person eating meals?: A Little Help from another person taking care of personal grooming?: A Little Help from another person toileting, which includes using toliet, bedpan, or urinal?: A Little Help from another person bathing (including washing, rinsing, drying)?: A Little Help from another person to put on and taking off regular upper body clothing?: A Little Help from another person to put on and taking off regular lower body clothing?: A Lot 6 Click Score: 17   End of Session Nurse Communication: Mobility status  Activity Tolerance: Patient limited by fatigue Patient left: with bed alarm set;in bed  OT Visit Diagnosis: Unsteadiness on feet (R26.81);Other abnormalities of gait and mobility (R26.89);Muscle weakness (generalized) (M62.81)                Time: 1250-1305 OT Time Calculation (min): 15 min Charges:  OT General Charges $OT Visit: 1 Visit OT Evaluation $OT Eval Moderate Complexity: 1 Mod  Kari Baars, [place] [phone] Office- [phone], Edwena Felty D 04/03/2020, 1:36 PM

## 2020-04-03 NOTE — Evaluation (Signed)
Physical Therapy Evaluation Patient Details Name: Veronica Young MRN: [id number] DOB: [birth year] Today's Date: 04/03/2020   History of Present Illness  86 yo femal admitted with acute respiratory failure possibly due to HF. Hx of deafness, CAD, CHF, A fib, PAD, SSS-pacemaker, DM  Clinical Impression  Sign language interpreter present for session. On eval, pt required Min assist for mobility. She walked ~15 feet x 2 with 1 HHA. Pt is unsteady and weaker than baseline. O2 86% on RA, 93% on 3L [place] with ambulation. Pt reported dyspnea and fatigue with minimal activity. Discussed d/c plan-pt plans to return home where she lives with her daughter who works from home. She is agreeable to HHPT and RW use for ambulation safety. Will continue to follow and progress activity as tolerated.     Follow Up Recommendations Home health PT;Supervision/Assistance - 24 hour    Equipment Recommendations  Rolling walker with 5" wheels    Recommendations for Other Services       Precautions / Restrictions Precautions Precautions: Fall Precaution Comments: monitor O2 Restrictions Weight Bearing Restrictions: No      Mobility  Bed Mobility Overal bed mobility: Needs Assistance Bed Mobility: Supine to Sit;Sit to Supine     Supine to sit: Supervision;HOB elevated Sit to supine: Supervision;HOB elevated      Transfers Overall transfer level: Needs assistance   Transfers: Sit to/from Stand Sit to Stand: Min assist         General transfer comment: Assist to rise, steady, control descent. Cues for hand placement.  Ambulation/Gait Ambulation/Gait assistance: Min assist Gait Distance (Feet): 15 Feet(2) Assistive device: 1 person hand held assist Gait Pattern/deviations: Step-through pattern;Decreased stride length     General Gait Details: Unsteady. O2 93% on 3L with short distance ambulation. Pt c/o some dyspnea, fatigue  Stairs            Wheelchair Mobility    Modified Rankin  (Stroke Patients Only)       Balance Overall balance assessment: Needs assistance         Standing balance support: Single extremity supported Standing balance-Leahy Scale: Poor                               Pertinent Vitals/Pain Pain Assessment: No/denies pain    Home Living Family/patient expects to be discharged to:: Private residence Living Arrangements: Children Available Help at Discharge: Family;Available 24 hours/day Type of Home: House Home Access: Stairs to enter   [employer] of Steps: 1 Home Layout: One level Home Equipment: None      Prior Function Level of Independence: Independent               Hand Dominance        Extremity/Trunk Assessment   Upper Extremity Assessment Upper Extremity Assessment: Defer to OT evaluation    Lower Extremity Assessment Lower Extremity Assessment: Generalized weakness    Cervical / Trunk Assessment Cervical / Trunk Assessment: Normal  Communication   Communication: Deaf  Cognition Arousal/Alertness: Awake/alert Behavior During Therapy: WFL for tasks assessed/performed Overall Cognitive Status: Difficult to assess                                 General Comments: appears WFL with use of interpreter      General Comments      Exercises     Assessment/Plan  PT Assessment Patient needs continued PT services  PT Problem List Decreased strength;Decreased mobility;Decreased activity tolerance;Decreased balance;Decreased knowledge of use of DME       PT Treatment Interventions DME instruction;Gait training;Therapeutic activities;Therapeutic exercise;Patient/family education;Balance training;Functional mobility training    PT Goals (Current goals can be found in the Care Plan section)  Acute Rehab PT Goals Patient Stated Goal: to get better/stronger PT Goal Formulation: With patient Time For Goal Achievement: 04/17/20 Potential to Achieve Goals: Good     Frequency Min 3X/week   Barriers to discharge        Co-evaluation               AM-PAC PT "6 Clicks" Mobility  Outcome Measure Help needed turning from your back to your side while in a flat bed without using bedrails?: A Little Help needed moving from lying on your back to sitting on the side of a flat bed without using bedrails?: A Little Help needed moving to and from a bed to a chair (including a wheelchair)?: A Little Help needed standing up from a chair using your arms (e.g., wheelchair or bedside chair)?: A Little Help needed to walk in hospital room?: A Little Help needed climbing 3-5 steps with a railing? : A Little 6 Click Score: 18    End of Session Equipment Utilized During Treatment: Gait belt;Oxygen Activity Tolerance: Patient limited by fatigue Patient left: in bed;with call bell/phone within reach;with bed alarm set   PT Visit Diagnosis: Unsteadiness on feet (R26.81);Muscle weakness (generalized) (M62.81)    Time: 9450-3888 PT Time Calculation (min) (ACUTE ONLY): 39 min   Charges:   PT Evaluation $PT Eval Low Complexity: 1 Low PT Treatments $Gait Training: 8-22 mins $Therapeutic Activity: 8-22 mins           Doreatha Massed, PT Acute Rehabilitation

## 2020-04-03 NOTE — Progress Notes (Signed)
Lower venous duplex       has been completed. Preliminary results can be found under CV proc through chart review. Faline Langer, BS, RDMS, RVT   

## 2020-04-03 NOTE — Consult Note (Signed)
Cardiology Consultation:   Patient ID: Veronica Young MRN: [id number]; DOB: 14-Apr-1933  Admit date: 04/02/2020 Date of Consult: 04/03/2020  Primary Care Provider: Katherina Mires, MD Primary Cardiologist: Peter [name], MD  Primary Electrophysiologist:  None    Patient Profile:   Veronica Young is a 86 y.o. female with a hx of deafness, HTN, HLD, DM2, PAD with known occluded popliteal artery (12/2018), sick sinus syndrome s/p PPM in august 2015, diastolic HF with normal EF, CKD stage 2, paroxysmal Afib on eliquis who is being seen today for the evaluation of heart failure at the request of Dr. Tyrell Antonio.  History of Present Illness:   Veronica Young is followed by Dr. [name]. In 2018 patient was found to have Atrial tachycardia with HR 150. Echo 07/2017 showed EF 65-70%, G1DD, mod TR and MR, Pa peak pressure 31mm Hg.  She saw Almyra Deforest 05/04/2019 and was stable from a cardiac perspective. Patient follows with Dr. Gwenlyn Found for known occluded right popoliteal. She was last seen by Dr. Gwenlyn Found 02/18/20 and was not having any claudication symptoms. In the past patient was taking Pletal but this was stopped due to a/c issues and she was started on Eliquis. Last device interrogation 01/20/20 showing normal functioning device.   The patient presented to the ED 04/02/20 for shortness of breath. Patient is deaf and history is obtained through interpreter. Shortness of breath started 3 days prior. She also noted lower leg swelling. She did not notice if she gained weight. She also had orthopnea. She is not on a diuretic at home. Denied chest pain, dizziness, lightheadedness, syncope, palpitations, fever, chills.   In the ED BP 163/112, pulse 89, afebrile, 94% O2 on 2-3L O2. Edema noted on exam. Labs showed potassium 3.9, glucose 117, creatinine 1.15, WBC 6.3, Hgb 14. Hs troponin 6>6. BNP 330. D-dimer 1.47. COVID negative. CXR with cardiomegaly with possible mild interstitial edema and trace right pleural effusion;  atelectasis vs PNA. EKG showed rate controlled Afib. Patient was given IV lasix in the ED and admitted.    Past Medical History:  Diagnosis Date  . Carotid bruit   . CHF (congestive heart failure) ([place])   . Deafness   . Diabetes mellitus    type 2  . Diastolic heart failure ([facility])    EF is 60%  . GERD (gastroesophageal reflux disease)   . Glaucoma   . Hypercholesterolemia   . Hypertension   . Hypothyroidism   . Normal cardiac stress test January 2014  . PAD (peripheral artery disease) ([place])    a. known bilateral popliteal occlusions with conservative management favored.  [name] PAT (paroxysmal atrial tachycardia) ([place])   . Sick sinus syndrome [facility])    a. s/p PPM placement 07/2014    Past Surgical History:  Procedure Laterality Date  . cholycystectomy    . left breast cyst removed    . PACEMAKER INSERTION  07-26-2014   MDT ADDRL1 pacemaker implanted by Dr Lovena Le for SSS  . PERMANENT PACEMAKER INSERTION N/A 07/26/2014   Procedure: PERMANENT PACEMAKER INSERTION;  Surgeon: Evans Lance, MD;  Location: [facility] C[facility] LAB;  Service: Cardiovascular;  Laterality: N/A;  . TOTAL KNEE ARTHROPLASTY     right     Home Medications:  Prior to Admission medications   Medication Sig Start Date End Date Taking? Authorizing Provider  apixaban (ELIQUIS) 5 MG TABS tablet Take 1 tablet (5 mg total) by mouth 2 (two) times daily. 02/09/20  Yes Evans Lance, MD  atorvastatin (LIPITOR)  80 MG tablet Take 80 mg by mouth at bedtime.    Yes [provider]  atropine 1 % ophthalmic solution Place 1 drop into the left eye at bedtime.    Yes [provider]  brimonidine (ALPHAGAN) 0.2 % ophthalmic solution Place 1 drop into both eyes daily. 05/20/17  Yes [provider]  calcium-vitamin D (OSCAL WITH D) 500-200 MG-UNIT per tablet Take 1 tablet by mouth daily with breakfast.   Yes [provider]  cholecalciferol (VITAMIN D) 1000 UNITS tablet Take 1,000 Units by mouth daily.   Yes  [provider]  citalopram (CELEXA) 20 MG tablet Take 20 mg by mouth at bedtime.  02/08/11  Yes [provider]  dorzolamide-timolol (COSOPT) 22.3-6.8 MG/ML ophthalmic solution Place 1 drop into both eyes 2 (two) times daily.    Yes [provider]  fluorometholone (FML) 0.1 % ophthalmic suspension Place 1 drop into the left eye 4 (four) times daily. 01/17/20  Yes [provider]  gabapentin (NEURONTIN) 100 MG capsule Take 100 mg by mouth daily.  10/24/19  Yes [provider]  hydrALAZINE (APRESOLINE) 50 MG tablet Take 50 mg by mouth 3 (three) times daily.   Yes [provider]  latanoprost (XALATAN) 0.005 % ophthalmic solution Place 1 drop into both eyes daily. 11/25/15  Yes [provider]  levothyroxine (SYNTHROID, LEVOTHROID) 112 MCG tablet Take 112 mcg by mouth daily before breakfast.  03/31/14  Yes [provider]  lisinopril (ZESTRIL) 40 MG tablet TAKE 1 TABLET EVERY DAY 05/24/19  Yes [name], Peter M, MD  metoprolol tartrate (LOPRESSOR) 25 MG tablet TAKE 1 TABLET BY MOUTH 2 TIMES DAILY. TAKE EXTRA 25MG  ( 1 TABLET) FOR PALPITATIONS AS NEEDED. 11/30/18  Yes [name], Peter M, MD  pantoprazole (PROTONIX) 40 MG tablet Take 40 mg by mouth daily. 02/03/20  Yes [provider]  RHOPRESSA 0.02 % SOLN Place 1 drop into the left eye at bedtime. 10/28/19  Yes [provider]    Inpatient Medications: Scheduled Meds: . apixaban  5 mg Oral BID  . atorvastatin  80 mg Oral QHS  . atropine  1 drop Left Eye QHS  . brimonidine  1 drop Both Eyes Daily  . citalopram  20 mg Oral QHS  . dorzolamide-timolol  1 drop Both Eyes BID  . furosemide  40 mg Intravenous Q12H  . gabapentin  100 mg Oral QHS  . hydrALAZINE  50 mg Oral TID  . latanoprost  1 drop Both Eyes Daily  . levothyroxine  112 mcg Oral Q0600  . lisinopril  40 mg Oral Daily  . metoprolol tartrate  25 mg Oral BID  . sodium chloride flush  3 mL Intravenous Q12H    Continuous Infusions: . sodium chloride     PRN Meds: sodium chloride, acetaminophen, ondansetron **OR** ondansetron (ZOFRAN) IV, sodium chloride flush  Allergies:    Allergies  Allergen Reactions  . Amlodipine Nausea Only       . Benicar [Olmesartan Medoxomil] Nausea Only    Social History:   Social History   Socioeconomic History  . Marital status: Widowed    Spouse name: Not on file  . Number of children: 4  . Years of education: Not on file  . Highest education level: Not on file  Occupational History  . Not on file  Tobacco Use  . Smoking status: Never Smoker  . Smokeless tobacco: Never Used  Substance and Sexual Activity  . Alcohol use: No  .  Drug use: No  . Sexual activity: Never  Other Topics Concern  . Not on file  Social History Narrative  . Not on file   Social Determinants of Health   Financial Resource Strain:   . Difficulty of Paying Living Expenses:   Food Insecurity:   . Worried About Charity fundraiser in the Last Year:   . Arboriculturist in the Last Year:   Transportation Needs:   . Film/video editor (Medical):   [name] Lack of Transportation (Non-Medical):   Physical Activity:   . Days of Exercise per Week:   . Minutes of Exercise per Session:   Stress:   . Feeling of Stress :   Social Connections:   . Frequency of Communication with Friends and Family:   . Frequency of Social Gatherings with Friends and Family:   . Attends Religious Services:   . Active Member of Clubs or Organizations:   . Attends Archivist Meetings:   [name] Marital Status:   Intimate Partner Violence:   . Fear of Current or Ex-Partner:   . Emotionally Abused:   [name] Physically Abused:   . Sexually Abused:     Family History:   Family History  Problem Relation Age of Onset  . Heart disease Mother   . Liver cancer Father   . Heart disease Sister   . Heart disease Brother   . Stroke Sister      ROS:  Please see the history of present illness.   All other ROS reviewed and negative.     Physical Exam/Data:   Vitals:   04/03/20 0430 04/03/20 0500 04/03/20 0553 04/03/20 1027  BP: 125/79 134/78 (!) 178/84 (!) 151/87  Pulse: 64 67 66 71  Resp: 18 16  18   Temp:   97.8 F (36.6 C) (!) 97.5 F (36.4 C)  TempSrc:   Oral Oral  SpO2: 96% 95% 100% 99%  Weight:   60.7 kg   Height:   5\' 5"  (1.651 m)     Intake/Output Summary (Last 24 hours) at 04/03/2020 1038 Last data filed at 04/03/2020 1016 Gross per 24 hour  Intake 180 ml  Output 3900 ml  Net -3720 ml   Last 3 Weights 04/03/2020 02/18/2020 12/13/2019  Weight (lbs) 133 lb 12.8 oz 143 lb 12.8 oz 139 lb  Weight (kg) 60.691 kg 65.227 kg 63.05 kg     Body mass index is 22.27 kg/m.  General:  Well nourished, well developed, in no acute distress HEENT: normal Lymph: no adenopathy Neck: + JVD Endocrine:  No thryomegaly Vascular: No carotid bruits; FA pulses 2+ bilaterally without bruits  Cardiac:  normal S1, S2; Irreg Irreg; no murmur  Lungs:  Diminished at bases 5L O2  Abd: soft, nontender, no hepatomegaly  Ext: 1+ edema Musculoskeletal:  No deformities, BUE and BLE strength normal and equal Skin: warm and dry  Neuro:  CNs 2-12 intact, no focal abnormalities noted Psych:  Normal affect   EKG:  The EKG was personally reviewed and demonstrates:  Afib, LVH with repol abnormalities, 89 bpm Telemetry:  Telemetry was personally reviewed and demonstrates:  Afib, PVC, rare A-paced rhythm, HR 60-70s  Relevant CV Studies:  Echo ordered  Echo 2018 - Left ventricle: The cavity size was normal. Wall thickness was  normal. Systolic function was vigorous. The estimated ejection  fraction was in the range of 65% to 70%. Wall motion was normal;  there were no regional wall motion abnormalities. Doppler  parameters are consistent with abnormal left ventricular  relaxation (grade 1 diastolic dysfunction). The E/e&' ratio is  >15, suggesting elevated LV filling pressure.  -  Mitral valve: Mildly thickened leaflets . There was mild  regurgitation.  - Left atrium: The atrium was normal in size.  - Right ventricle: The cavity size was normal. Wall thickness was  normal. The moderator band was prominent. Pacer wire or catheter  noted in right ventricle. Systolic function was normal.  - Right atrium: The atrium was normal in size. Pacer wire or  catheter noted in right atrium.  - Tricuspid valve: There was moderate regurgitation.  - Pulmonary arteries: PA peak pressure: 51 mm Hg (S).  - Inferior vena cava: The vessel was dilated. The respirophasic  diameter changes were blunted (< 50%), consistent with elevated  central venous pressure.   Impressions:   - Compared to a prior study in 2015, there are few changes. LVEF is  stable, RVSP is stable - the degree of diastolic dysfunction is  less, however, LV filling pressure is elevated. Pacer wires now  noted in the right heart.   Lexicscan Myoview 04/2013 Resting EKG shows NSR with occ PVCs and PACs. ST-T wave abnormality consistent with inferolateral and anterolateral ischemia versus Dig effect. Lexiscan infusion associated with chest tightness and dyspnea. Increased ST depression diffusely with Lexiscan infusion.    Laboratory Data:  High Sensitivity Troponin:   Recent Labs  Lab 04/02/20 0200 04/02/20 1651 04/02/20 1833 04/03/20 0000  TROPONINIHS 9 6 6 8      Chemistry Recent Labs  Lab 04/02/20 1651 04/03/20 0559  NA 136 139  K 3.9 3.3*  CL 104 98  CO2 23 28  GLUCOSE 117* 102*  BUN 22 21  CREATININE 1.15* 1.19*  CALCIUM 9.7 9.1  GFRNONAA 44* 42*  GFRAA 51* 49*  ANIONGAP 9 13    Recent Labs  Lab 04/03/20 0559  PROT 7.2  ALBUMIN 3.8  AST 34  ALT 28  ALKPHOS 115  BILITOT 1.2   Hematology Recent Labs  Lab 04/02/20 1651 04/03/20 0559  WBC 6.3 6.2  RBC 4.79 4.70  HGB 14.0 13.6  HCT 44.1 42.8  MCV 92.1 91.1  MCH 29.2 28.9  MCHC 31.7 31.8  RDW 15.5 15.4  PLT  200 182   BNP Recent Labs  Lab 04/02/20 1651  BNP 330.0*    DDimer  Recent Labs  Lab 04/02/20 1941  DDIMER 1.47*     Radiology/Studies:  DG Chest 2 View  Result Date: 04/02/2020 CLINICAL DATA:  Shortness of breath EXAM: CHEST - 2 VIEW COMPARISON:  06/16/2017 FINDINGS: Cardiomegaly with pulmonary vascular congestion and possible mild interstitial edema. Mild patchy bilateral lower lobe opacities, atelectasis versus pneumonia. Suspected trace right pleural effusion. No pneumothorax. Left subclavian pacemaker. IMPRESSION: Cardiomegaly with possible mild interstitial edema and trace right pleural effusion. Mild patchy bilateral lower lobe opacities, atelectasis versus pneumonia. Electronically Signed   By: Julian Hy M.D.   On: 04/02/2020 16:12   DG CHEST PORT 1 VIEW  Result Date: 04/03/2020 CLINICAL DATA:  Hypoxia EXAM: PORTABLE CHEST 1 VIEW COMPARISON:  04/02/2020, 06/16/2017 FINDINGS: Left-sided pacing device as before. Cardiomegaly. Minimal basilar atelectasis. No significant pleural effusion. IMPRESSION: No active disease.  Cardiomegaly with mild basilar atelectasis Electronically Signed   By: Donavan Foil M.D.   On: 04/03/2020 01:38   {  Assessment and Plan:   Acute Respiratory failure - likely 2/2 to acute HF - Patient needed up to 6L O2 on  admission, now on 5L - D-dimer mildly elevated. On a/c so PE less likely - COVID negative.  - LE dopplers negative for DVT  Acute on chronic diastolic HF - BNP 794 and cxr with cardiomegaly with possible mild interstitial edema and trace right pleural effusion. Hs trop negative - started on IV lasix 40 mg BID - patient put out 3.8L overnight.  - Daily weights - creatinine 1.15>1.19 - Echo ordered. Echo in 2018 with preserved EF - patient is still volume up on 5L O2. Says breathing is still a little short while walking to the bathroom. Continue with diuresis. MD to see  HLD - continue Lipitor - LDL 127 in 2018. Will  recheck  HTN - home meds restarted. Hydralazine 50 mg TID, lisinopril 40 mg daily, Lopressor 25 mg BID - IV lasix - High this AM before meds, otherwise stable  Paroxysmal Afib - Eliquis for stroke ppx - rate controlled with Metoprolol  For questions or updates, please contact [place] Please consult [email or web address] for contact info under     Signed, Chuong Casebeer Ninfa Meeker, PA-C  04/03/2020 10:38 AM

## 2020-04-03 NOTE — Progress Notes (Signed)
Juliann Pulse from the deaf interpreter service.  She assisted me with completion of patient's nursing admission work

## 2020-04-03 NOTE — Progress Notes (Signed)
PROGRESS NOTE    Veronica Young  TDS:[id number] DOB: Jan 13, 1933 DOA: 04/02/2020 PCP: Katherina Mires, MD   Brief Narrative: 86 year old with past medical history significant for deafness, hypertension, hyperlipidemia, permanent transvenous pacemaker insertion on 8/15, paroxysmal A. fib on Eliquis, diastolic heart failure with normal ejection fraction, PAD, anemia, chronic back pain who presented with worsening shortness of breath for the last 3 days prior to admission.  She denies chest pain. Patient in the ED show elevated BNP 300, acute hypoxic respiratory failure requiring 5 to rest of oxygen.  Chest x-ray showing fluid overload.  She was a started on IV Lasix.  Assessment & Plan:   Active Problems:   Hypercholesterolemia   GERD (gastroesophageal reflux disease)   Hypothyroidism   PAD (peripheral artery disease) (HCC)   HTN (hypertension)   PAF (paroxysmal atrial fibrillation) (HCC)   Acute respiratory failure with hypoxia (HCC)   Acute on chronic diastolic CHF (congestive heart failure) (HCC)   Acute respiratory failure (HCC)   1-Acute hypoxic respiratory failure, new onset, patient requiring up to 10 L of oxygen Secondary to probably diastolic heart failure exacerbation Continue with Lasix 40 mg IV twice daily Wean off of oxygen as able. Strict I's and O's, -3 L. Echo ordered. Cardiology consulted. D-dimer elevated, with patient denies chest pain, she is already on Eliquis.  2-Acute on chronic diastolic heart failure: Troponins negative. Continue with IV Lexis. Neurology consulted.  3-Hypercholesterolemia: Continue Lipitor 4-GERD: Continue with Protonix 5-Hypertension: Continue with hydralazine 6-Paroxysmal A. fib: Continue with Eliquis and metoprolol 7-Hypo-thyroidism: Continue with Synthroid.  TSH 5.5 mildly elevated repeat as an outpatient 8-lower extremity edema elevated D-dimer.  Check Doppler 9-hypokalemia: Replete orally Estimated body mass index is 22.27  kg/m as calculated from the following:   Height as of this encounter: 5\' 5"  (1.651 m).   Weight as of this encounter: 60.7 kg.   DVT prophylaxis: Eliquis Code Status: DNR/DNI Family Communication: Care discussed with patient directly Disposition Plan:  Patient is from: Home Anticipated d/c date: 3 days probably home Barriers to d/c or necessity for inpatient status: Being in the hospital for treatment of acute hypoxic respiratory failure still requiring IV be [place]  Consultants:   Cardiology  Procedures:   Echo  Antimicrobials:  None  Subjective: Patient seen and evaluated, sign interpreter used during my evaluation.  Patient reported feeling better than yesterday, she is able to breath better.  She does not use oxygen at home.  She denies chest pain.  She is complaining of her chronic back pain  Objective: Vitals:   04/03/20 0400 04/03/20 0430 04/03/20 0500 04/03/20 0553  BP: 117/65 125/79 134/78 (!) 178/84  Pulse: 71 64 67 66  Resp: (!) 23 18 16    Temp:    97.8 F (36.6 C)  TempSrc:    Oral  SpO2: 97% 96% 95% 100%  Weight:    60.7 kg  Height:    5\' 5"  (1.651 m)    Intake/Output Summary (Last 24 hours) at 04/03/2020 0928 Last data filed at 04/03/2020 0914 Gross per 24 hour  Intake 180 ml  Output 3800 ml  Net -3620 ml   Filed Weights   04/03/20 0553  Weight: 60.7 kg    Examination:  General exam: Appears calm and comfortable  Respiratory system: Lateral crackles Cardiovascular system: S1 & S2 heard, RRR.JVD, murmurs, rubs, gallops or clicks. Gastrointestinal system: Abdomen is nondistended, soft and nontender. No organomegaly or masses felt. Normal bowel sounds heard. Central nervous system: Alert  and oriented. No focal neurological deficits. Extremities: +2 lower extremity edema Skin: No rashes, lesions or ulcers Psychiatry: Judgement and insight appear normal. Mood & affect appropriate.     Data Reviewed: I have personally reviewed following labs and  imaging studies  CBC: Recent Labs  Lab 04/02/20 1651 04/03/20 0559  WBC 6.3 6.2  NEUTROABS 4.7  --   HGB 14.0 13.6  HCT 44.1 42.8  MCV 92.1 91.1  PLT 200 202   Basic Metabolic Panel: Recent Labs  Lab 04/02/20 1651 04/03/20 0559  NA 136 139  K 3.9 3.3*  CL 104 98  CO2 23 28  GLUCOSE 117* 102*  BUN 22 21  CREATININE 1.15* 1.19*  CALCIUM 9.7 9.1  MG  --  1.8  PHOS  --  4.0   GFR: Estimated Creatinine Clearance: 31.7 mL/min (A) (by C-G formula based on SCr of 1.19 mg/dL (H)). Liver Function Tests: Recent Labs  Lab 04/03/20 0559  AST 34  ALT 28  ALKPHOS 115  BILITOT 1.2  PROT 7.2  ALBUMIN 3.8   No results for input(s): LIPASE, AMYLASE in the last 168 hours. No results for input(s): AMMONIA in the last 168 hours. Coagulation Profile: No results for input(s): INR, PROTIME in the last 168 hours. Cardiac Enzymes: No results for input(s): CKTOTAL, CKMB, CKMBINDEX, TROPONINI in the last 168 hours. BNP (last 3 results) No results for input(s): PROBNP in the last 8760 hours. HbA1C: No results for input(s): HGBA1C in the last 72 hours. CBG: No results for input(s): GLUCAP in the last 168 hours. Lipid Profile: No results for input(s): CHOL, HDL, LDLCALC, TRIG, CHOLHDL, LDLDIRECT in the last 72 hours. Thyroid Function Tests: Recent Labs    04/03/20 0559  TSH 5.569*   Anemia Panel: No results for input(s): VITAMINB12, FOLATE, FERRITIN, TIBC, IRON, RETICCTPCT in the last 72 hours. Sepsis Labs: Recent Labs  Lab 04/02/20 2027 04/03/20 0559  PROCALCITON <0.10 <0.10    Recent Results (from the past 240 hour(s))  SARS CORONAVIRUS 2 (TAT 6-24 HRS) Nasopharyngeal Nasopharyngeal Swab     Status: None   Collection Time: 04/02/20  4:51 PM   Specimen: Nasopharyngeal Swab  Result Value Ref Range Status   SARS Coronavirus 2 NEGATIVE NEGATIVE Final    Comment: (NOTE) SARS-CoV-2 target nucleic acids are NOT DETECTED. The SARS-CoV-2 RNA is generally detectable in upper  and lower respiratory specimens during the acute phase of infection. Negative results do not preclude SARS-CoV-2 infection, do not rule out co-infections with other pathogens, and should not be used as the sole basis for treatment or other patient management decisions. Negative results must be combined with clinical observations, patient history, and epidemiological information. The expected result is Negative. Fact Sheet for Patients: SugarRoll.be Fact Sheet for Healthcare Providers: https://www.woods-mathews.com/ This test is not yet approved or cleared by the [place] FDA and  has been authorized for detection and/or diagnosis of SARS-CoV-2 by FDA under an Emergency Use Authorization (EUA). This EUA will remain  in effect (meaning this test can be used) for the duration of the COVID-19 declaration under Section 56 4(b)(1) of the Act, 21 U.S.C. section 360bbb-3(b)(1), unless the authorization is terminated or revoked sooner. Performed at [facility], [place] [address] [place], [place] [address]          Radiology Studies: DG Chest 2 View  Result Date: 04/02/2020 CLINICAL DATA:  Shortness of breath EXAM: CHEST - 2 VIEW COMPARISON:  06/16/2017 FINDINGS: Cardiomegaly with pulmonary vascular congestion and  possible mild interstitial edema. Mild patchy bilateral lower lobe opacities, atelectasis versus pneumonia. Suspected trace right pleural effusion. No pneumothorax. Left subclavian pacemaker. IMPRESSION: Cardiomegaly with possible mild interstitial edema and trace right pleural effusion. Mild patchy bilateral lower lobe opacities, atelectasis versus pneumonia. Electronically Signed   By: Julian Hy M.D.   On: 04/02/2020 16:12   DG CHEST PORT 1 VIEW  Result Date: 04/03/2020 CLINICAL DATA:  Hypoxia EXAM: PORTABLE CHEST 1 VIEW COMPARISON:  04/02/2020, 06/16/2017 FINDINGS: Left-sided pacing device as before. Cardiomegaly. Minimal  basilar atelectasis. No significant pleural effusion. IMPRESSION: No active disease.  Cardiomegaly with mild basilar atelectasis Electronically Signed   By: Donavan Foil M.D.   On: 04/03/2020 01:38        Scheduled Meds: . apixaban  5 mg Oral BID  . atorvastatin  80 mg Oral QHS  . atropine  1 drop Left Eye QHS  . brimonidine  1 drop Both Eyes Daily  . citalopram  20 mg Oral QHS  . dorzolamide-timolol  1 drop Both Eyes BID  . furosemide  40 mg Intravenous Q12H  . gabapentin  100 mg Oral QHS  . hydrALAZINE  50 mg Oral TID  . latanoprost  1 drop Both Eyes Daily  . levothyroxine  112 mcg Oral Q0600  . lisinopril  40 mg Oral Daily  . metoprolol tartrate  25 mg Oral BID  . potassium chloride  40 mEq Oral Once  . sodium chloride flush  3 mL Intravenous Q12H   Continuous Infusions: . sodium chloride       LOS: 1 day    Time spent: 35 minutes    Tomia Enlow A Sencere Symonette, MD [facility]   If 7PM-7AM, please contact night-coverage [email or web address]  04/03/2020, 9:28 AM

## 2020-04-04 ENCOUNTER — Ambulatory Visit: Payer: Medicare HMO | Admitting: Cardiology

## 2020-04-04 DIAGNOSIS — I509 Heart failure, unspecified: Secondary | ICD-10-CM | POA: Diagnosis not present

## 2020-04-04 LAB — CBC
HCT: 40.5 % (ref 36.0–46.0)
Hemoglobin: 13 g/dL (ref 12.0–15.0)
MCH: 29 pg (ref 26.0–34.0)
MCHC: 32.1 g/dL (ref 30.0–36.0)
MCV: 90.2 fL (ref 80.0–100.0)
Platelets: 178 10*3/uL (ref 150–400)
RBC: 4.49 MIL/uL (ref 3.87–5.11)
RDW: 15.6 % — ABNORMAL HIGH (ref 11.5–15.5)
WBC: 6.8 10*3/uL (ref 4.0–10.5)
nRBC: 0 % (ref 0.0–0.2)

## 2020-04-04 LAB — LIPID PANEL
Cholesterol: 141 mg/dL (ref 0–200)
HDL: 33 mg/dL — ABNORMAL LOW (ref >40)
LDL Cholesterol: 92 mg/dL (ref 0–99)
Total CHOL/HDL Ratio: 4.3 RATIO
Triglycerides: 79 mg/dL (ref <150)
VLDL: 16 mg/dL (ref 0–40)

## 2020-04-04 LAB — BASIC METABOLIC PANEL
Anion gap: 11 (ref 5–15)
BUN: 23 mg/dL (ref 8–23)
CO2: 26 mmol/L (ref 22–32)
Calcium: 9.1 mg/dL (ref 8.9–10.3)
Chloride: 98 mmol/L (ref 98–111)
Creatinine, Ser: 1.42 mg/dL — ABNORMAL HIGH (ref 0.44–1.00)
GFR calc Af Amer: 39 mL/min — ABNORMAL LOW (ref >60)
GFR calc non Af Amer: 34 mL/min — ABNORMAL LOW (ref >60)
Glucose, Bld: 102 mg/dL — ABNORMAL HIGH (ref 70–99)
Potassium: 3.4 mmol/L — ABNORMAL LOW (ref 3.5–5.1)
Sodium: 135 mmol/L (ref 135–145)

## 2020-04-04 LAB — PROCALCITONIN: Procalcitonin: <0.1 ng/mL

## 2020-04-04 MED ORDER — FUROSEMIDE 10 MG/ML IJ SOLN
80.0000 mg | Freq: Two times a day (BID) | INTRAMUSCULAR | Status: DC
  Administered 2020-04-04: 80 mg via INTRAVENOUS
  Filled 2020-04-04: qty 8

## 2020-04-04 MED ORDER — FUROSEMIDE 10 MG/ML IJ SOLN: 80.0000 mg | Freq: Two times a day (BID) | INTRAMUSCULAR | Status: DC

## 2020-04-04 MED ORDER — METHOCARBAMOL 1000 MG/10ML IJ SOLN
500.0000 mg | Freq: Three times a day (TID) | INTRAVENOUS | Status: DC | PRN
  Filled 2020-04-04: qty 5

## 2020-04-04 MED ORDER — POTASSIUM CHLORIDE CRYS ER 20 MEQ PO TBCR
40.0000 meq | EXTENDED_RELEASE_TABLET | Freq: Two times a day (BID) | ORAL | Status: DC
  Administered 2020-04-04 – 2020-04-05 (×3): 40 meq via ORAL
  Filled 2020-04-04 (×3): qty 2

## 2020-04-04 MED ORDER — LATANOPROST 0.005 % OP SOLN
1.0000 [drp] | Freq: Every day | OPHTHALMIC | Status: DC
  Administered 2020-04-04 – 2020-04-05 (×2): 1 [drp] via OPHTHALMIC
  Filled 2020-04-04: qty 2.5

## 2020-04-04 MED ORDER — POTASSIUM CHLORIDE CRYS ER 20 MEQ PO TBCR
20.0000 meq | EXTENDED_RELEASE_TABLET | Freq: Once | ORAL | Status: AC
  Administered 2020-04-04: 17:00:00 20 meq via ORAL
  Filled 2020-04-04: qty 1

## 2020-04-04 NOTE — Progress Notes (Signed)
Progress Note  Patient Name: Veronica Young Date of Encounter: 04/04/2020  Primary Cardiologist: Peter [name], MD   Subjective   Patient put out 2.6 L urine overnight. Creatinine up this AM. Patient is still in rate controlled afib. Denies chest pain. Still has some shortness of breath. Using 4L O2 during my exam. Plans to walk with PT today.   Inpatient Medications    Scheduled Meds: . apixaban  5 mg Oral BID  . atorvastatin  80 mg Oral QHS  . atropine  1 drop Left Eye QHS  . brimonidine  1 drop Both Eyes Daily  . citalopram  20 mg Oral QHS  . dorzolamide-timolol  1 drop Both Eyes BID  . gabapentin  100 mg Oral QHS  . hydrALAZINE  50 mg Oral TID  . latanoprost  1 drop Both Eyes Daily  . levothyroxine  112 mcg Oral Q0600  . lisinopril  40 mg Oral Daily  . metoprolol tartrate  25 mg Oral BID  . potassium chloride  40 mEq Oral Daily  . sodium chloride flush  3 mL Intravenous Q12H   Continuous Infusions: . sodium chloride    . methocarbamol (ROBAXIN) IV     PRN Meds: sodium chloride, acetaminophen, methocarbamol (ROBAXIN) IV, ondansetron **OR** ondansetron (ZOFRAN) IV, sodium chloride flush   Vital Signs    Vitals:   04/03/20 2127 04/03/20 2232 04/04/20 0500 04/04/20 0609  BP: 140/85 120/74  (!) 100/52  Pulse:  70  70  Resp:      Temp:  97.7 F (36.5 C)  98.6 F (37 C)  TempSrc:  Oral  Oral  SpO2:  99%  96%  Weight:   61.1 kg 58.3 kg  Height:        Intake/Output Summary (Last 24 hours) at 04/04/2020 0755 Last data filed at 04/04/2020 0636 Gross per 24 hour  Intake 230 ml  Output 2650 ml  Net -2420 ml   Last 3 Weights 04/04/2020 04/04/2020 04/03/2020  Weight (lbs) 128 lb 9.6 oz 134 lb 11.2 oz 133 lb 12.8 oz  Weight (kg) 58.333 kg 61.1 kg 60.691 kg      Telemetry    Afib, HR 60-70 - Personally Reviewed  ECG    No new - Personally Reviewed  Physical Exam   GEN: No acute distress.   Neck: minimal JVD Cardiac: Irreg Irreg, no murmurs, rubs, or  gallops.  Respiratory: crackles at bases; 4L O2 GI: Soft, nontender, non-distended  MS: Trace edema; No deformity. Neuro:  Nonfocal  Psych: Normal affect   Labs    High Sensitivity Troponin:   Recent Labs  Lab 04/02/20 0200 04/02/20 1651 04/02/20 1833 04/03/20 0000  TROPONINIHS 9 6 6 8       Chemistry Recent Labs  Lab 04/02/20 1651 04/03/20 0559 04/04/20 0354  NA 136 139 135  K 3.9 3.3* 3.4*  CL 104 98 98  CO2 23 28 26   GLUCOSE 117* 102* 102*  BUN 22 21 23   CREATININE 1.15* 1.19* 1.42*  CALCIUM 9.7 9.1 9.1  PROT  --  7.2  --   ALBUMIN  --  3.8  --   AST  --  34  --   ALT  --  28  --   ALKPHOS  --  115  --   BILITOT  --  1.2  --   GFRNONAA 44* 42* 34*  GFRAA 51* 49* 39*  ANIONGAP 9 13 11      Hematology Recent Labs  Lab  04/02/20 1651 04/03/20 0559 04/04/20 0354  WBC 6.3 6.2 6.8  RBC 4.79 4.70 4.49  HGB 14.0 13.6 13.0  HCT 44.1 42.8 40.5  MCV 92.1 91.1 90.2  MCH 29.2 28.9 29.0  MCHC 31.7 31.8 32.1  RDW 15.5 15.4 15.6*  PLT 200 182 178    BNP Recent Labs  Lab 04/02/20 1651  BNP 330.0*     DDimer  Recent Labs  Lab 04/02/20 1941  DDIMER 1.47*     Radiology    DG Chest 2 View  Result Date: 04/02/2020 CLINICAL DATA:  Shortness of breath EXAM: CHEST - 2 VIEW COMPARISON:  06/16/2017 FINDINGS: Cardiomegaly with pulmonary vascular congestion and possible mild interstitial edema. Mild patchy bilateral lower lobe opacities, atelectasis versus pneumonia. Suspected trace right pleural effusion. No pneumothorax. Left subclavian pacemaker. IMPRESSION: Cardiomegaly with possible mild interstitial edema and trace right pleural effusion. Mild patchy bilateral lower lobe opacities, atelectasis versus pneumonia. Electronically Signed   By: Julian Hy M.D.   On: 04/02/2020 16:12   DG CHEST PORT 1 VIEW  Result Date: 04/03/2020 CLINICAL DATA:  Hypoxia EXAM: PORTABLE CHEST 1 VIEW COMPARISON:  04/02/2020, 06/16/2017 FINDINGS: Left-sided pacing device as  before. Cardiomegaly. Minimal basilar atelectasis. No significant pleural effusion. IMPRESSION: No active disease.  Cardiomegaly with mild basilar atelectasis Electronically Signed   By: Donavan Foil M.D.   On: 04/03/2020 01:38   ECHOCARDIOGRAM COMPLETE  Result Date: 04/03/2020    ECHOCARDIOGRAM REPORT   Patient Name:   Veronica Young Date of Exam: 04/03/2020 Medical Rec #:  [id number]      Height:       65.0 in Accession #:    [id number]     Weight:       133.8 lb Date of Birth:  03/22/1933     BSA:          1.667 m Patient Age:    86 years       BP:           151/87 mmHg Patient Gender: F              HR:           71 bpm. Exam Location:  Inpatient Procedure: 2D Echo Indications:    CHF-Acute Diastolic 937.90 / W40.97  History:        Patient has prior history of Echocardiogram examinations, most                 recent 06/17/2017. CAD, Pacemaker, Arrythmias:Tachycardia and                 Atrial Fibrillation; Risk Factors:Hypertension. Acute                 respiratory failure.  Sonographer:    Vikki Ports Turrentine Referring Phys: Arnold  1. Left ventricular ejection fraction, by estimation, is 55 to 60%. The left ventricle has normal function. The left ventricle has no regional wall motion abnormalities. There is mild concentric left ventricular hypertrophy. Left ventricular diastolic parameters are indeterminate.  2. Right ventricular systolic function is normal. The right ventricular size is normal. There is severely elevated pulmonary artery systolic pressure. The estimated right ventricular systolic pressure is 35.3 mmHg.  3. Right atrial size was mildly dilated.  4. The mitral valve is normal in structure. Mild mitral valve regurgitation. No evidence of mitral stenosis.  5. The aortic valve is tricuspid. Aortic valve regurgitation is trivial. No aortic stenosis is present.  6.  The inferior vena cava is dilated in size with <50% respiratory variability, suggesting right atrial  pressure of 15 mmHg. FINDINGS  Left Ventricle: Left ventricular ejection fraction, by estimation, is 55 to 60%. The left ventricle has normal function. The left ventricle has no regional wall motion abnormalities. The left ventricular internal cavity size was normal in size. There is  mild concentric left ventricular hypertrophy. Left ventricular diastolic parameters are indeterminate. Right Ventricle: The right ventricular size is normal. No increase in right ventricular wall thickness. Right ventricular systolic function is normal. There is severely elevated pulmonary artery systolic pressure. The tricuspid regurgitant velocity is 3.51 m/s, and with an assumed right atrial pressure of 15 mmHg, the estimated right ventricular systolic pressure is 96.2 mmHg. Left Atrium: Left atrial size was normal in size. Right Atrium: Right atrial size was mildly dilated. Pericardium: There is no evidence of pericardial effusion. Mitral Valve: The mitral valve is normal in structure. Normal mobility of the mitral valve leaflets. Mild mitral valve regurgitation. No evidence of mitral valve stenosis. Tricuspid Valve: The tricuspid valve is normal in structure. Tricuspid valve regurgitation is mild . No evidence of tricuspid stenosis. Aortic Valve: The aortic valve is tricuspid. Aortic valve regurgitation is trivial. No aortic stenosis is present. Pulmonic Valve: The pulmonic valve was normal in structure. Pulmonic valve regurgitation is not visualized. No evidence of pulmonic stenosis. Aorta: The aortic root is normal in size and structure. Venous: The inferior vena cava is dilated in size with less than 50% respiratory variability, suggesting right atrial pressure of 15 mmHg. IAS/Shunts: No atrial level shunt detected by color flow Doppler. Additional Comments: A pacer wire is visualized.  LEFT VENTRICLE PLAX 2D LVIDd:         3.90 cm LVIDs:         2.80 cm LV PW:         1.30 cm LV IVS:        1.30 cm LVOT diam:     1.70 cm LV  SV:         31 LV SV Index:   19 LVOT Area:     2.27 cm  RIGHT VENTRICLE RV S prime:     8.03 cm/s TAPSE (M-mode): 1.3 cm LEFT ATRIUM             Index       RIGHT ATRIUM           Index LA diam:        3.70 cm 2.22 cm/m  RA Area:     17.70 cm LA Vol (A2C):   28.7 ml 17.21 ml/m RA Volume:   48.60 ml  29.15 ml/m LA Vol (A4C):   41.1 ml 24.65 ml/m LA Biplane Vol: 35.3 ml 21.17 ml/m  AORTIC VALVE LVOT Vmax:   84.90 cm/s LVOT Vmean:  65.500 cm/s LVOT VTI:    0.138 m  AORTA Ao Root diam: 2.60 cm MITRAL VALVE               TRICUSPID VALVE MV Area (PHT): 3.93 cm    TR Peak grad:   49.3 mmHg MV Decel Time: 193 msec    TR Vmax:        351.00 cm/s MV E velocity: 96.80 cm/s MV A velocity: 28.70 cm/s  SHUNTS MV E/A ratio:  3.37        Systemic VTI:  0.14 m  Systemic Diam: 1.70 cm Skeet Latch MD Electronically signed by Skeet Latch MD Signature Date/Time: 04/03/2020/2:13:50 PM    Final    VAS [place] LOWER EXTREMITY VENOUS (DVT)  Result Date: 04/03/2020  Lower Venous DVTStudy Indications: Swelling.  Comparison Study: no prior Performing Technologist: June Leap RDMS, RVT  Examination Guidelines: A complete evaluation includes B-mode imaging, spectral Doppler, color Doppler, and power Doppler as needed of all accessible portions of each vessel. Bilateral testing is considered an integral part of a complete examination. Limited examinations for reoccurring indications may be performed as noted. The reflux portion of the exam is performed with the patient in reverse Trendelenburg.  +---------+---------------+---------+-----------+----------+--------------+ RIGHT    CompressibilityPhasicitySpontaneityPropertiesThrombus Aging +---------+---------------+---------+-----------+----------+--------------+ CFV      Full           Yes      Yes                                 +---------+---------------+---------+-----------+----------+--------------+ SFJ      Full                                                         +---------+---------------+---------+-----------+----------+--------------+ FV Prox  Full                                                        +---------+---------------+---------+-----------+----------+--------------+ FV Mid   Full                                                        +---------+---------------+---------+-----------+----------+--------------+ FV DistalFull                                                        +---------+---------------+---------+-----------+----------+--------------+ PFV      Full                                                        +---------+---------------+---------+-----------+----------+--------------+ POP      Full           Yes      Yes                                 +---------+---------------+---------+-----------+----------+--------------+ PTV      Full                                                        +---------+---------------+---------+-----------+----------+--------------+  PERO     Full                                                        +---------+---------------+---------+-----------+----------+--------------+   +---------+---------------+---------+-----------+----------+--------------+ LEFT     CompressibilityPhasicitySpontaneityPropertiesThrombus Aging +---------+---------------+---------+-----------+----------+--------------+ CFV      Full           Yes      Yes                                 +---------+---------------+---------+-----------+----------+--------------+ SFJ      Full                                                        +---------+---------------+---------+-----------+----------+--------------+ FV Prox  Full                                                        +---------+---------------+---------+-----------+----------+--------------+ FV Mid   Full                                                         +---------+---------------+---------+-----------+----------+--------------+ FV DistalFull                                                        +---------+---------------+---------+-----------+----------+--------------+ PFV      Full                                                        +---------+---------------+---------+-----------+----------+--------------+ POP      Full           Yes      Yes                                 +---------+---------------+---------+-----------+----------+--------------+ PTV      Full                                                        +---------+---------------+---------+-----------+----------+--------------+ PERO     Full                                                        +---------+---------------+---------+-----------+----------+--------------+  Summary: RIGHT: - There is no evidence of deep vein thrombosis in the lower extremity.  - No cystic structure found in the popliteal fossa. - Pulsatile venous flow suggestive of increased right sided heart pressure.  LEFT: - There is no evidence of deep vein thrombosis in the lower extremity.  - No cystic structure found in the popliteal fossa. - Pulsatile venous flow suggestive of increased right sided heart pressure.  *See table(s) above for measurements and observations.    Preliminary     Cardiac Studies   Echo 04/03/20 1. Left ventricular ejection fraction, by estimation, is 55 to 60%. The  left ventricle has normal function. The left ventricle has no regional  wall motion abnormalities. There is mild concentric left ventricular  hypertrophy. Left ventricular diastolic  parameters are indeterminate.  2. Right ventricular systolic function is normal. The right ventricular  size is normal. There is severely elevated pulmonary artery systolic  pressure. The estimated right ventricular systolic pressure is 02.5 mmHg.  3. Right atrial size was mildly dilated.  4. The mitral  valve is normal in structure. Mild mitral valve  regurgitation. No evidence of mitral stenosis.  5. The aortic valve is tricuspid. Aortic valve regurgitation is trivial.  No aortic stenosis is present.  6. The inferior vena cava is dilated in size with <50% respiratory  variability, suggesting right atrial pressure of 15 mmHg.   Patient Profile     86 y.o. female with a hx of deafness, HTN, HLD, DM2, PAD with known occluded popliteal artery (12/2018), sick sinus syndrome s/p PPM in august 2015, diastolic HF with normal EF, CKD stage 2, paroxysmal Afib on eliquis who is being seen for the evaluation of heart failure.   Assessment & Plan   Acute on chronic diastolic HF - BNP 852 and cxr with cardiomegaly with possible mild interstitial edema and trace right pleural effusion. Hs trop negative - started on IV lasix 40 mg BID>>increased to IV lasix 80 mg  - patient put out 2.6L overnight, -6.2Lsince admission  - Weights down 5 lbs - Oxygen requirement is improving. Plans to walk with PT today.  - Echo showed EF 55-60%, no RWMA, mild LVH, RA mildly dilated, mild MR, trivial AR - creatinine 1.15>1.19>1.42.  - I think patient still needs more diuresis but will hold for now with worsening kidney function. MD to see.   HLD - continue Lipitor - LDL 92  HTN - home meds restarted. Hydralazine 50 mg TID, lisinopril 40 mg daily, Lopressor 25 mg BID - IV lasix - pressures stable  Paroxysmal Afib - Eliquis for stroke ppx - rate controlled with Metoprolol - Can consider DCCV as outpatient or if shortness of breath does not improve while admitted  Hypokalemia - supplemented yesterday. Level today 3.4 - 55meq daily  For questions or updates, please contact [place] Please consult [email or web address] for contact info under        Signed, Luellen Howson Ninfa Meeker, PA-C  04/04/2020, 7:55 AM

## 2020-04-04 NOTE — Progress Notes (Addendum)
Pt made nurse aware that she has started having leg twitching/jumping in the right leg. Told daughter that it does not hurt but is uncomfortable and making it hard for her to stay asleep. Twitching occurs every minute or so per pt. Provider M. Sharlet Salina paged and made aware. No new orders or changes at this time. Will continue to monitor pt.  Changes made, see new orders.

## 2020-04-04 NOTE — Progress Notes (Addendum)
Pt is deaf and legally blind in left eye Pt has interpreter Sherry Ruffing at bedside to assist pt. Pt communicated with MDs this am, daughter also at bedside. Plan of care reviewed with pt, acknowledged understanding on care. Pt had loose diarrhea stools on yesterday and have not had BMs for today. Informed pt to call if loose WATERY BM noted. Will continue to monitor. SRP, RN

## 2020-04-04 NOTE — Progress Notes (Signed)
PROGRESS NOTE    Veronica Young  DDU:[id number] DOB: Feb 13, 1933 DOA: 04/02/2020 PCP: Katherina Mires, MD   Brief Narrative: 86 year old with past medical history significant for deafness, hypertension, hyperlipidemia, permanent transvenous pacemaker insertion on 8/15, paroxysmal A. fib on Eliquis, diastolic heart failure with normal ejection fraction, PAD, anemia, chronic back pain who presented with worsening shortness of breath for the last 3 days prior to admission.  She denies chest pain. Patient in the ED show elevated BNP 300, acute hypoxic respiratory failure requiring 5 to rest of oxygen.  Chest x-ray showing fluid overload.  She was a started on IV Lasix.  Assessment & Plan:   Active Problems:   Hypercholesterolemia   GERD (gastroesophageal reflux disease)   Hypothyroidism   PAD (peripheral artery disease) (HCC)   HTN (hypertension)   PAF (paroxysmal atrial fibrillation) (HCC)   Acute respiratory failure with hypoxia (HCC)   Acute on chronic diastolic CHF (congestive heart failure) (HCC)   Acute respiratory failure (HCC)   1-Acute hypoxic respiratory failure, new onset, patient requiring up to 10 L of oxygen Secondary to probably diastolic heart failure exacerbation Wean off of oxygen as able. Strict I's and O's, -5 L, weight ;133---128 Echo ordered. Showed preserved EF.  Cardiology consulted. D-dimer elevated, with patient denies chest pain, she is already on Eliquis. Plan is to continue with IV lasix 80 mg IV BID.   2-Acute on chronic diastolic heart failure: Troponins negative. Continue with IV Laxis. Cardiology consulted. Managing diuresis.  Hold lisinopril due to mild increase cr.   3-Hypercholesterolemia: Continue Lipitor  4-GERD: Continue with Protonix.  5-Hypertension: Continue with hydralazine.  6-Paroxysmal A. fib: Continue with Eliquis and metoprolol. If patient doesn't convert to sinus, cardiology is plaining outpatient cardioversion.    7-Hypo-thyroidism: Continue with Synthroid.  TSH 5.5 mildly elevated repeat as an outpatient.  8-lower extremity edema elevated D-dimer.  Doppler negative for DVT 9-hypokalemia: Replete orally 10-diarrhea; for last week, 2 episodes, watery. Will check GI pathogen. Will check Free T 3 , Free T4.   Estimated body mass index is 21.4 kg/m as calculated from the following:   Height as of this encounter: 5\' 5"  (1.651 m).   Weight as of this encounter: 58.3 kg.   DVT prophylaxis: Eliquis Code Status: DNR/DNI Family Communication: Care discussed with patient directly Disposition Plan:  Patient is from: Home Anticipated d/c date: 3 days probably home Barriers to d/c or necessity for inpatient status: Remain  in the hospital for treatment of acute hypoxic respiratory failure still requiring IV Laxis  Consultants:   Cardiology  Procedures:   Echo  Antimicrobials:  None  Subjective: Breathing better. Requiring 4 L oxygen today.  Report diarrhea, 2 episodes, watery stool for a week now.   Objective: Vitals:   04/03/20 2232 04/04/20 0500 04/04/20 0609 04/04/20 1404  BP: 120/74  (!) 100/52 (!) 104/52  Pulse: 70  70 73  Resp:    (!) 22  Temp: 97.7 F (36.5 C)  98.6 F (37 C) (!) 97.5 F (36.4 C)  TempSrc: Oral  Oral Axillary  SpO2: 99%  96% 99%  Weight:  61.1 kg 58.3 kg   Height:        Intake/Output Summary (Last 24 hours) at 04/04/2020 1437 Last data filed at 04/04/2020 0900 Gross per 24 hour  Intake 290 ml  Output 2550 ml  Net -2260 ml   Filed Weights   04/03/20 0553 04/04/20 0500 04/04/20 0609  Weight: 60.7 kg 61.1 kg 58.3 kg  Examination:  General exam: NAD Respiratory system: BL crackles Cardiovascular system: S 1 S 2 IRR Gastrointestinal system: BS present, soft, nt Central nervous system: alert Extremities: trace edema Skin: No rashes  Data Reviewed: I have personally reviewed following labs and imaging studies  CBC: Recent Labs  Lab  04/02/20 1651 04/03/20 0559 04/04/20 0354  WBC 6.3 6.2 6.8  NEUTROABS 4.7  --   --   HGB 14.0 13.6 13.0  HCT 44.1 42.8 40.5  MCV 92.1 91.1 90.2  PLT 200 182 191   Basic Metabolic Panel: Recent Labs  Lab 04/02/20 1651 04/03/20 0559 04/04/20 0354  NA 136 139 135  K 3.9 3.3* 3.4*  CL 104 98 98  CO2 23 28 26   GLUCOSE 117* 102* 102*  BUN 22 21 23   CREATININE 1.15* 1.19* 1.42*  CALCIUM 9.7 9.1 9.1  MG  --  1.8  --   PHOS  --  4.0  --    GFR: Estimated Creatinine Clearance: 26.5 mL/min (A) (by C-G formula based on SCr of 1.42 mg/dL (H)). Liver Function Tests: Recent Labs  Lab 04/03/20 0559  AST 34  ALT 28  ALKPHOS 115  BILITOT 1.2  PROT 7.2  ALBUMIN 3.8   No results for input(s): LIPASE, AMYLASE in the last 168 hours. No results for input(s): AMMONIA in the last 168 hours. Coagulation Profile: No results for input(s): INR, PROTIME in the last 168 hours. Cardiac Enzymes: No results for input(s): CKTOTAL, CKMB, CKMBINDEX, TROPONINI in the last 168 hours. BNP (last 3 results) No results for input(s): PROBNP in the last 8760 hours. HbA1C: No results for input(s): HGBA1C in the last 72 hours. CBG: No results for input(s): GLUCAP in the last 168 hours. Lipid Profile: Recent Labs    04/04/20 0354  CHOL 141  HDL 33*  LDLCALC 92  TRIG 79  CHOLHDL 4.3   Thyroid Function Tests: Recent Labs    04/03/20 0559  TSH 5.569*   Anemia Panel: No results for input(s): VITAMINB12, FOLATE, FERRITIN, TIBC, IRON, RETICCTPCT in the last 72 hours. Sepsis Labs: Recent Labs  Lab 04/02/20 2027 04/03/20 0559 04/04/20 0354  PROCALCITON <0.10 <0.10 <0.10    Recent Results (from the past 240 hour(s))  SARS CORONAVIRUS 2 (TAT 6-24 HRS) Nasopharyngeal Nasopharyngeal Swab     Status: None   Collection Time: 04/02/20  4:51 PM   Specimen: Nasopharyngeal Swab  Result Value Ref Range Status   SARS Coronavirus 2 NEGATIVE NEGATIVE Final    Comment: (NOTE) SARS-CoV-2 target  nucleic acids are NOT DETECTED. The SARS-CoV-2 RNA is generally detectable in upper and lower respiratory specimens during the acute phase of infection. Negative results do not preclude SARS-CoV-2 infection, do not rule out co-infections with other pathogens, and should not be used as the sole basis for treatment or other patient management decisions. Negative results must be combined with clinical observations, patient history, and epidemiological information. The expected result is Negative. Fact Sheet for Patients: SugarRoll.be Fact Sheet for Healthcare Providers: https://www.woods-mathews.com/ This test is not yet approved or cleared by the [place] FDA and  has been authorized for detection and/or diagnosis of SARS-CoV-2 by FDA under an Emergency Use Authorization (EUA). This EUA will remain  in effect (meaning this test can be used) for the duration of the COVID-19 declaration under Section 56 4(b)(1) of the Act, 21 U.S.C. section 360bbb-3(b)(1), unless the authorization is terminated or revoked sooner. Performed at [facility], [place] [address], [place], [place] [address]  Radiology Studies: DG Chest 2 View  Result Date: 04/02/2020 CLINICAL DATA:  Shortness of breath EXAM: CHEST - 2 VIEW COMPARISON:  06/16/2017 FINDINGS: Cardiomegaly with pulmonary vascular congestion and possible mild interstitial edema. Mild patchy bilateral lower lobe opacities, atelectasis versus pneumonia. Suspected trace right pleural effusion. No pneumothorax. Left subclavian pacemaker. IMPRESSION: Cardiomegaly with possible mild interstitial edema and trace right pleural effusion. Mild patchy bilateral lower lobe opacities, atelectasis versus pneumonia. Electronically Signed   By: Julian Hy M.D.   On: 04/02/2020 16:12   DG CHEST PORT 1 VIEW  Result Date: 04/03/2020 CLINICAL DATA:  Hypoxia EXAM: PORTABLE CHEST 1 VIEW COMPARISON:   04/02/2020, 06/16/2017 FINDINGS: Left-sided pacing device as before. Cardiomegaly. Minimal basilar atelectasis. No significant pleural effusion. IMPRESSION: No active disease.  Cardiomegaly with mild basilar atelectasis Electronically Signed   By: Donavan Foil M.D.   On: 04/03/2020 01:38   ECHOCARDIOGRAM COMPLETE  Result Date: 04/03/2020    ECHOCARDIOGRAM REPORT   Patient Name:   Veronica Young Date of Exam: 04/03/2020 Medical Rec #:  [id number]      Height:       65.0 in Accession #:    [id number]     Weight:       133.8 lb Date of Birth:  1933/02/23     BSA:          1.667 m Patient Age:    43 years       BP:           151/87 mmHg Patient Gender: F              HR:           71 bpm. Exam Location:  Inpatient Procedure: 2D Echo Indications:    CHF-Acute Diastolic 195.09 / T26.71  History:        Patient has prior history of Echocardiogram examinations, most                 recent 06/17/2017. CAD, Pacemaker, Arrythmias:Tachycardia and                 Atrial Fibrillation; Risk Factors:Hypertension. Acute                 respiratory failure.  Sonographer:    Vikki Ports T[place]ine Referring Phys: [place]  1. Left ventricular ejection fraction, by estimation, is 55 to 60%. The left ventricle has normal function. The left ventricle has no regional wall motion abnormalities. There is mild concentric left ventricular hypertrophy. Left ventricular diastolic parameters are indeterminate.  2. Right ventricular systolic function is normal. The right ventricular size is normal. There is severely elevated pulmonary artery systolic pressure. The estimated right ventricular systolic pressure is 24.5 mmHg.  3. Right atrial size was mildly dilated.  4. The mitral valve is normal in structure. Mild mitral valve regurgitation. No evidence of mitral stenosis.  5. The aortic valve is tricuspid. Aortic valve regurgitation is trivial. No aortic stenosis is present.  6. The inferior vena cava is dilated in size  with <50% respiratory variability, suggesting right atrial pressure of 15 mmHg. FINDINGS  Left Ventricle: Left ventricular ejection fraction, by estimation, is 55 to 60%. The left ventricle has normal function. The left ventricle has no regional wall motion abnormalities. The left ventricular internal cavity size was normal in size. There is  mild concentric left ventricular hypertrophy. Left ventricular diastolic parameters are indeterminate. Right Ventricle: The right ventricular size is normal. No increase in right  ventricular wall thickness. Right ventricular systolic function is normal. There is severely elevated pulmonary artery systolic pressure. The tricuspid regurgitant velocity is 3.51 m/s, and with an assumed right atrial pressure of 15 mmHg, the estimated right ventricular systolic pressure is 36.4 mmHg. Left Atrium: Left atrial size was normal in size. Right Atrium: Right atrial size was mildly dilated. Pericardium: There is no evidence of pericardial effusion. Mitral Valve: The mitral valve is normal in structure. Normal mobility of the mitral valve leaflets. Mild mitral valve regurgitation. No evidence of mitral valve stenosis. Tricuspid Valve: The tricuspid valve is normal in structure. Tricuspid valve regurgitation is mild . No evidence of tricuspid stenosis. Aortic Valve: The aortic valve is tricuspid. Aortic valve regurgitation is trivial. No aortic stenosis is present. Pulmonic Valve: The pulmonic valve was normal in structure. Pulmonic valve regurgitation is not visualized. No evidence of pulmonic stenosis. Aorta: The aortic root is normal in size and structure. Venous: The inferior vena cava is dilated in size with less than 50% respiratory variability, suggesting right atrial pressure of 15 mmHg. IAS/Shunts: No atrial level shunt detected by color flow Doppler. Additional Comments: A pacer wire is visualized.  LEFT VENTRICLE PLAX 2D LVIDd:         3.90 cm LVIDs:         2.80 cm LV PW:          1.30 cm LV IVS:        1.30 cm LVOT diam:     1.70 cm LV SV:         31 LV SV Index:   19 LVOT Area:     2.27 cm  RIGHT VENTRICLE RV S prime:     8.03 cm/s TAPSE (M-mode): 1.3 cm LEFT ATRIUM             Index       RIGHT ATRIUM           Index LA diam:        3.70 cm 2.22 cm/m  RA Area:     17.70 cm LA Vol (A2C):   28.7 ml 17.21 ml/m RA Volume:   48.60 ml  29.15 ml/m LA Vol (A4C):   41.1 ml 24.65 ml/m LA Biplane Vol: 35.3 ml 21.17 ml/m  AORTIC VALVE LVOT Vmax:   84.90 cm/s LVOT Vmean:  65.500 cm/s LVOT VTI:    0.138 m  AORTA Ao Root diam: 2.60 cm MITRAL VALVE               TRICUSPID VALVE MV Area (PHT): 3.93 cm    TR Peak grad:   49.3 mmHg MV Decel Time: 193 msec    TR Vmax:        351.00 cm/s MV E velocity: 96.80 cm/s MV A velocity: 28.70 cm/s  SHUNTS MV E/A ratio:  3.37        Systemic VTI:  0.14 m                            Systemic Diam: 1.70 cm Skeet Latch MD Electronically signed by Skeet Latch MD Signature Date/Time: 04/03/2020/2:13:50 PM    Final    VAS [place] LOWER EXTREMITY VENOUS (DVT)  Result Date: 04/03/2020  Lower Venous DVTStudy Indications: Swelling.  Comparison Study: no prior Performing Technologist: June Leap RDMS, RVT  Examination Guidelines: A complete evaluation includes B-mode imaging, spectral Doppler, color Doppler, and power Doppler as needed of all accessible portions of  each vessel. Bilateral testing is considered an integral part of a complete examination. Limited examinations for reoccurring indications may be performed as noted. The reflux portion of the exam is performed with the patient in reverse Trendelenburg.  +---------+---------------+---------+-----------+----------+--------------+ RIGHT    CompressibilityPhasicitySpontaneityPropertiesThrombus Aging +---------+---------------+---------+-----------+----------+--------------+ CFV      Full           Yes      Yes                                  +---------+---------------+---------+-----------+----------+--------------+ SFJ      Full                                                        +---------+---------------+---------+-----------+----------+--------------+ FV Prox  Full                                                        +---------+---------------+---------+-----------+----------+--------------+ FV Mid   Full                                                        +---------+---------------+---------+-----------+----------+--------------+ FV DistalFull                                                        +---------+---------------+---------+-----------+----------+--------------+ PFV      Full                                                        +---------+---------------+---------+-----------+----------+--------------+ POP      Full           Yes      Yes                                 +---------+---------------+---------+-----------+----------+--------------+ PTV      Full                                                        +---------+---------------+---------+-----------+----------+--------------+ PERO     Full                                                        +---------+---------------+---------+-----------+----------+--------------+   +---------+---------------+---------+-----------+----------+--------------+ LEFT     CompressibilityPhasicitySpontaneityPropertiesThrombus  Aging +---------+---------------+---------+-----------+----------+--------------+ CFV      Full           Yes      Yes                                 +---------+---------------+---------+-----------+----------+--------------+ SFJ      Full                                                        +---------+---------------+---------+-----------+----------+--------------+ FV Prox  Full                                                         +---------+---------------+---------+-----------+----------+--------------+ FV Mid   Full                                                        +---------+---------------+---------+-----------+----------+--------------+ FV DistalFull                                                        +---------+---------------+---------+-----------+----------+--------------+ PFV      Full                                                        +---------+---------------+---------+-----------+----------+--------------+ POP      Full           Yes      Yes                                 +---------+---------------+---------+-----------+----------+--------------+ PTV      Full                                                        +---------+---------------+---------+-----------+----------+--------------+ PERO     Full                                                        +---------+---------------+---------+-----------+----------+--------------+     Summary: RIGHT: - There is no evidence of deep vein thrombosis in the lower extremity.  - No cystic structure found in the popliteal fossa. - Pulsatile venous flow suggestive of increased right sided heart pressure.  LEFT: - There is no evidence of deep vein thrombosis in the  lower extremity.  - No cystic structure found in the popliteal fossa. - Pulsatile venous flow suggestive of increased right sided heart pressure.  *See table(s) above for measurements and observations.    Preliminary         Scheduled Meds: . apixaban  5 mg Oral BID  . atorvastatin  80 mg Oral QHS  . atropine  1 drop Left Eye QHS  . brimonidine  1 drop Both Eyes Daily  . citalopram  20 mg Oral QHS  . dorzolamide-timolol  1 drop Both Eyes BID  . furosemide  80 mg Intravenous BID  . gabapentin  100 mg Oral QHS  . hydrALAZINE  50 mg Oral TID  . latanoprost  1 drop Left Eye QHS  . levothyroxine  112 mcg Oral Q0600  . metoprolol tartrate  25 mg Oral BID  .  potassium chloride  40 mEq Oral BID  . sodium chloride flush  3 mL Intravenous Q12H   Continuous Infusions: . sodium chloride    . methocarbamol (ROBAXIN) IV       LOS: 2 days    Time spent: 35 minutes    Jermayne Sweeney A Shere Eisenhart, MD [facility]   If 7PM-7AM, please contact night-coverage [email or web address]  04/04/2020, 2:37 PM

## 2020-04-04 NOTE — Plan of Care (Signed)
  Problem: Education: Goal: Knowledge of General Education information will improve Description: Including pain rating scale, medication(s)/side effects and non-pharmacologic comfort measures Outcome: Progressing   Problem: Clinical Measurements: Goal: Will remain free from infection Outcome: Progressing   

## 2020-04-04 NOTE — Plan of Care (Signed)
  Problem: Education: Goal: Knowledge of General Education information will improve Description: Including pain rating scale, medication(s)/side effects and non-pharmacologic comfort measures 04/04/2020 1216 by Zadie Rhine, RN Outcome: Progressing 04/04/2020 1129 by Zadie Rhine, RN Outcome: Progressing   Problem: Clinical Measurements: Goal: Will remain free from infection 04/04/2020 1216 by Zadie Rhine, RN Outcome: Progressing 04/04/2020 1129 by Zadie Rhine, RN Outcome: Progressing Goal: Diagnostic test results will improve Outcome: Progressing

## 2020-04-05 ENCOUNTER — Inpatient Hospital Stay (HOSPITAL_COMMUNITY): Payer: Medicare HMO

## 2020-04-05 DIAGNOSIS — I5033 Acute on chronic diastolic (congestive) heart failure: Secondary | ICD-10-CM

## 2020-04-05 DIAGNOSIS — I10 Essential (primary) hypertension: Secondary | ICD-10-CM | POA: Diagnosis not present

## 2020-04-05 DIAGNOSIS — K219 Gastro-esophageal reflux disease without esophagitis: Secondary | ICD-10-CM | POA: Diagnosis not present

## 2020-04-05 DIAGNOSIS — J9601 Acute respiratory failure with hypoxia: Secondary | ICD-10-CM | POA: Diagnosis not present

## 2020-04-05 LAB — BASIC METABOLIC PANEL
Anion gap: 9 (ref 5–15)
BUN: 27 mg/dL — ABNORMAL HIGH (ref 8–23)
CO2: 30 mmol/L (ref 22–32)
Calcium: 9.3 mg/dL (ref 8.9–10.3)
Chloride: 97 mmol/L — ABNORMAL LOW (ref 98–111)
Creatinine, Ser: 1.54 mg/dL — ABNORMAL HIGH (ref 0.44–1.00)
GFR calc Af Amer: 36 mL/min — ABNORMAL LOW (ref >60)
GFR calc non Af Amer: 31 mL/min — ABNORMAL LOW (ref >60)
Glucose, Bld: 121 mg/dL — ABNORMAL HIGH (ref 70–99)
Potassium: 4.6 mmol/L (ref 3.5–5.1)
Sodium: 136 mmol/L (ref 135–145)

## 2020-04-05 LAB — T4, FREE: Free T4: 1.82 ng/dL — ABNORMAL HIGH (ref 0.61–1.12)

## 2020-04-05 MED ORDER — FUROSEMIDE 20 MG PO TABS
20.0000 mg | ORAL_TABLET | Freq: Every day | ORAL | Status: DC
  Administered 2020-04-06: 20 mg via ORAL
  Filled 2020-04-05: qty 1

## 2020-04-05 MED ORDER — FUROSEMIDE 40 MG PO TABS
80.0000 mg | ORAL_TABLET | Freq: Two times a day (BID) | ORAL | Status: DC
  Administered 2020-04-05: 80 mg via ORAL
  Filled 2020-04-05: qty 2

## 2020-04-05 NOTE — Progress Notes (Signed)
Physical Therapy Treatment Patient Details Name: Veronica Young MRN: [id number] DOB: November 23, 1933 Today's Date: 04/05/2020    History of Present Illness 86 yo femal admitted with acute respiratory failure possibly due to HF. Hx of deafness, CAD, CHF, A fib, PAD, SSS-pacemaker, DM    PT Comments    Progressing with mobility. O2 87% on RA with ambulation.    Follow Up Recommendations  Home health PT;Supervision/Assistance - 24 hour     Equipment Recommendations  Rolling walker with 5" wheels(if pt is agreeable)    Recommendations for Other Services       Precautions / Restrictions Precautions Precautions: Fall Precaution Comments: monitor O2 Restrictions Weight Bearing Restrictions: No    Mobility  Bed Mobility Overal bed mobility: Needs Assistance Bed Mobility: Supine to Sit;Sit to Supine     Supine to sit: Supervision;HOB elevated Sit to supine: Supervision;HOB elevated   General bed mobility comments: for safety  Transfers Overall transfer level: Needs assistance Equipment used: Rolling walker (2 wheeled) Transfers: Sit to/from Stand Sit to Stand: Min guard         General transfer comment: for safety.  Ambulation/Gait Ambulation/Gait assistance: Min guard Gait Distance (Feet): 250 Feet Assistive device: Rolling walker (2 wheeled) Gait Pattern/deviations: Step-through pattern;Decreased stride length     General Gait Details: Pt did not want to use the RW but she was compliant with therapist's request. O2 87% on RA.   Stairs             Wheelchair Mobility    Modified Rankin (Stroke Patients Only)       Balance Overall balance assessment: Mild deficits observed, not formally tested                                          Cognition Arousal/Alertness: Awake/alert Behavior During Therapy: WFL for tasks assessed/performed Overall Cognitive Status: Difficult to assess                                  General Comments: appears WFL with use of interpreter      Exercises      General Comments        Pertinent Vitals/Pain Pain Assessment: No/denies pain    Home Living                      Prior Function            PT Goals (current goals can now be found in the care plan section) Progress towards PT goals: Progressing toward goals    Frequency    Min 3X/week      PT Plan Current plan remains appropriate    Co-evaluation              AM-PAC PT "6 Clicks" Mobility   Outcome Measure  Help needed turning from your back to your side while in a flat bed without using bedrails?: A Little Help needed moving from lying on your back to sitting on the side of a flat bed without using bedrails?: A Little Help needed moving to and from a bed to a chair (including a wheelchair)?: A Little Help needed standing up from a chair using your arms (e.g., wheelchair or bedside chair)?: A Little Help needed to walk in hospital room?: A Little Help needed climbing  3-5 steps with a railing? : A Little 6 Click Score: 18    End of Session Equipment Utilized During Treatment: Gait belt Activity Tolerance: Patient tolerated treatment well Patient left: in bed;with call bell/phone within reach;with bed alarm set   PT Visit Diagnosis: Unsteadiness on feet (R26.81);Muscle weakness (generalized) (M62.81)     Time: 9324-1991 PT Time Calculation (min) (ACUTE ONLY): 24 min  Charges:  $Gait Training: 23-37 mins                       Doreatha Massed, PT Acute Rehabilitation

## 2020-04-05 NOTE — Care Management Important Message (Signed)
Important Message  Patient Details IM Letter given to Gabriel Earing RN Case Manager to present to the Patient Name: CHRISTABELLE HANZLIK MRN: [id number] Date of Birth: [birth year]   Medicare Important Message Given:  Yes     Kerin Salen 04/05/2020, 11:59 AM

## 2020-04-05 NOTE — TOC Transition Note (Addendum)
Transition of Care [facility]) - CM/SW Discharge Note   Patient Details  Name: Veronica Young MRN: [id number] Date of Birth: Jul 24, 1933  Transition of Care [facility] CM/SW Contact:  Ross Ludwig, LCSW Phone Number: 04/05/2020, 11:23 AM 04/06/20 12:00pm Updated discharge  Clinical Narrative:     Patient is an 86 year old female who is alert and oriented x4.  Patient lives with her daughter, who helps take care of her.  Patient is deaf and partially blind.  PT recommended home health for her, CSW spoke to patient's daughter who is agreeable to home health, but she wanted to speak to patient to discuss to services availabe.  04/06/20 12:00pm CSW spoke to patient's daughter again today, and she is still not sure about home health, she will discuss with patient and then call CSW back.  Patient will need oxygen, oxygen set up through [facility], they will deliver before patient discharges today.  5:11pm CSW spoke with [facility], and they are able to accept patient for home health PT and OT.  CSW spoke to [facility] and he is able to staff patient for start tomorrow.  CSW signing off, please reconsult if social work needs arise.    Final next level of care: [place] Barriers to Discharge: Continued Medical Work up   Patient Goals and CMS Choice Patient states their goals for this hospitalization and ongoing recovery are:: To return back home with home health services. CMS Medicare.gov Compare Post Acute Care list provided to:: Patient Represenative (must comment) Choice offered to / list presented to : Adult Children  Discharge Placement  Patient discharging back home with home health services.              Discharge Plan and Services In-house Referral: Clinical Social Work Discharge Planning Services: NA Post Acute Care Choice: Home Health                               Social Determinants of Health (SDOH) Interventions     Readmission Risk Interventions No  flowsheet data found.

## 2020-04-05 NOTE — Progress Notes (Signed)
PROGRESS NOTE    Veronica Young  ENI:[id number] DOB: March 03, 1933 DOA: 04/02/2020 PCP: Katherina Mires, MD    Chief Complaint  Patient presents with  . Shortness of Breath    Brief Narrative:  86 year old with past medical history significant for deafness, hypertension, hyperlipidemia, permanent transvenous pacemaker insertion on 8/15, paroxysmal A. fib on Eliquis, diastolic heart failure with normal ejection fraction, PAD, anemia, chronic back pain who presented with worsening shortness of breath for the last 3 days prior to admission.  She denies chest pain. Patient in the ED show elevated BNP 300, acute hypoxic respiratory failure requiring 5 to rest of oxygen.  Chest x-ray showing fluid overload.  She was a started on IV Lasix   Assessment & Plan:   Active Problems:   Hypercholesterolemia   GERD (gastroesophageal reflux disease)   Hypothyroidism   PAD (peripheral artery disease) (HCC)   HTN (hypertension)   PAF (paroxysmal atrial fibrillation) (HCC)   Acute respiratory failure with hypoxia (HCC)   Acute on chronic diastolic CHF (congestive heart failure) ([place])   Acute respiratory failure ([place])  #1 acute hypoxemic respiratory failure new onset, patient required up to 10 L of oxygen on admission Secondary to acute diastolic CHF exacerbation in the setting of persistent atrial fibrillation.  O2 requirements improving.  Patient was on IV Lasix 80 mg twice daily, with urine output of 650 cc over the past 24 hours however likely not properly recorded.  Patient is -6.380 L during this hospitalization.  Current weight of 59.4 kg.  Currently on 3 L nasal cannula.  IV Lasix being transitioned to oral Lasix per cardiology.  Continue to wean O2.  Follow.  2.  Acute diastolic CHF exacerbation Questionable etiology. Could be secondary to persistent A. fib.  Patient was on Lasix 80 mg IV twice daily.  Patient is -6.380 L during this hospitalization.  Current weight of 59.4 kg.  O2 requirements  improving.  IV Lasix has been transitioned to oral Lasix per cardiology.  Continue Lipitor, hydralazine, Lopressor.  Cardiology following and appreciate input and recommendations.  3.  Persistent atrial fibrillation status post PPM Continue metoprolol for rate control.  Eliquis for anticoagulation.  Per cardiology if patient does not convert could likely consider outpatient cardioversion in the outpatient setting.  4.  Gastroesophageal reflux disease PPI.  5.  Hyperlipidemia Continue statin.  6.  Hypertension Continue hydralazine and Lopressor.  7.  Hypothyroidism Continue Synthroid.  Will need repeat thyroid function studies done in the outpatient setting in 4 to 6 weeks.  8.  Elevated D-dimer/lower extremity edema Dopplers negative for DVT.  Patient already on anticoagulation.  Lower extremity edema improved with diuresis.  Outpatient follow-up.  9.  Hypokalemia Secondary to diuresis.  Patient currently on scheduled oral potassium supplementation.  Potassium at 4.6.  Discontinue oral potassium supplementation.  10.  Diarrhea Ongoing for the past 2 weeks.  Stool consistency improving.  GI pathogen panel pending.  Supportive care.   DVT prophylaxis: Eliquis Code Status: DNR Family Communication: Updated patient via interpreter at bedside. Disposition:   Status is: Inpatient    Dispo: The patient is from: Home              Anticipated d/c is to: Home              Anticipated d/c date is: 04/06/2020 04/07/2020              Patient currently improving clinically.  Was on IV Lasix and being transitioned  to oral Lasix.  Likely close to discharge and likely discharge home with home health therapies once hypoxia has resolved and improved with clinical improvement.        Consultants:   Cardiology: Dr. Farris Has 04/03/2020  Procedures:  2D echo 04/03/2020  Lower extremity Dopplers 04/03/2020  Chest x-ray 04/02/2020, 04/03/2020, 04/05/2020  Antimicrobials:   None    Subjective: Patient sitting up in bed on 3 L nasal cannula.  Denies any chest pain.  States shortness of breath has improved significantly.  States lower extremity edema improved.  Feeling better.  Interpreter at bedside.  Objective: Vitals:   04/05/20 0303 04/05/20 0500 04/05/20 0552 04/05/20 0600  BP:   119/68   Pulse:   66   Resp: 18  20   Temp:   98 F (36.7 C)   TempSrc:   Oral   SpO2:   96%   Weight:  59.4 kg  63 kg  Height:        Intake/Output Summary (Last 24 hours) at 04/05/2020 1245 Last data filed at 04/05/2020 0918 Gross per 24 hour  Intake 490 ml  Output 650 ml  Net -160 ml   Filed Weights   04/04/20 0609 04/05/20 0500 04/05/20 0600  Weight: 58.3 kg 59.4 kg 63 kg    Examination:  General exam: Appears calm and comfortable  Respiratory system: Clear to auscultation. Respiratory effort normal. Cardiovascular system: Irregularly irregular.  No JVD, murmurs, rubs, gallops or clicks. No pedal edema. Gastrointestinal system: Abdomen is nondistended, soft and nontender. No organomegaly or masses felt. Normal bowel sounds heard. Central nervous system: Alert and oriented. No focal neurological deficits. Extremities: Symmetric 5 x 5 power. Skin: No rashes, lesions or ulcers Psychiatry: Judgement and insight appear normal. Mood & affect appropriate.     Data Reviewed: I have personally reviewed following labs and imaging studies  CBC: Recent Labs  Lab 04/02/20 1651 04/03/20 0559 04/04/20 0354  WBC 6.3 6.2 6.8  NEUTROABS 4.7  --   --   HGB 14.0 13.6 13.0  HCT 44.1 42.8 40.5  MCV 92.1 91.1 90.2  PLT 200 182 166    Basic Metabolic Panel: Recent Labs  Lab 04/02/20 1651 04/03/20 0559 04/04/20 0354 04/05/20 0754  NA 136 139 135 136  K 3.9 3.3* 3.4* 4.6  CL 104 98 98 97*  CO2 23 28 26 30   GLUCOSE 117* 102* 102* 121*  BUN 22 21 23  27*  CREATININE 1.15* 1.19* 1.42* 1.54*  CALCIUM 9.7 9.1 9.1 9.3  MG  --  1.8  --   --   PHOS  --  4.0  --   --      GFR: Estimated Creatinine Clearance: 24.5 mL/min (A) (by C-G formula based on SCr of 1.54 mg/dL (H)).  Liver Function Tests: Recent Labs  Lab 04/03/20 0559  AST 34  ALT 28  ALKPHOS 115  BILITOT 1.2  PROT 7.2  ALBUMIN 3.8    CBG: No results for input(s): GLUCAP in the last 168 hours.   Recent Results (from the past 240 hour(s))  SARS CORONAVIRUS 2 (TAT 6-24 HRS) Nasopharyngeal Nasopharyngeal Swab     Status: None   Collection Time: 04/02/20  4:51 PM   Specimen: Nasopharyngeal Swab  Result Value Ref Range Status   SARS Coronavirus 2 NEGATIVE NEGATIVE Final    Comment: (NOTE) SARS-CoV-2 target nucleic acids are NOT DETECTED. The SARS-CoV-2 RNA is generally detectable in upper and lower respiratory specimens during the acute phase of  infection. Negative results do not preclude SARS-CoV-2 infection, do not rule out co-infections with other pathogens, and should not be used as the sole basis for treatment or other patient management decisions. Negative results must be combined with clinical observations, patient history, and epidemiological information. The expected result is Negative. Fact Sheet for Patients: SugarRoll.be Fact Sheet for Healthcare Providers: https://www.woods-mathews.com/ This test is not yet approved or cleared by the [place] FDA and  has been authorized for detection and/or diagnosis of SARS-CoV-2 by FDA under an Emergency Use Authorization (EUA). This EUA will remain  in effect (meaning this test can be used) for the duration of the COVID-19 declaration under Section 56 4(b)(1) of the Act, 21 U.S.C. section 360bbb-3(b)(1), unless the authorization is terminated or revoked sooner. Performed at [facility], [place] [address] [place], [place] [address]          Radiology Studies: No results found.      Scheduled Meds: . apixaban  5 mg Oral BID  . atorvastatin  80 mg Oral QHS  . atropine  1  drop Left Eye QHS  . brimonidine  1 drop Both Eyes Daily  . citalopram  20 mg Oral QHS  . dorzolamide-timolol  1 drop Both Eyes BID  . furosemide  80 mg Oral BID  . gabapentin  100 mg Oral QHS  . hydrALAZINE  50 mg Oral TID  . latanoprost  1 drop Left Eye QHS  . levothyroxine  112 mcg Oral Q0600  . metoprolol tartrate  25 mg Oral BID  . potassium chloride  40 mEq Oral BID  . sodium chloride flush  3 mL Intravenous Q12H   Continuous Infusions: . sodium chloride    . methocarbamol (ROBAXIN) IV       LOS: 3 days    Time spent: 40 minutes    Irine Seal, MD [facility]   To contact the attending provider between 7A-7P or the covering provider during after hours 7P-7A, please log into the web site [email or web address] and access using universal [place] password for that web site. If you do not have the password, please call the hospital operator.  04/05/2020, 12:45 PM

## 2020-04-05 NOTE — Progress Notes (Addendum)
Pt had a small formed stool and did not have BM on yesterday, does not meet the criteria to send down for GI panel. Will cont to monitor for sign of diarrhea. Pt updated and family made aware. SRP RN

## 2020-04-05 NOTE — Progress Notes (Signed)
Progress Note  Patient Name: Veronica Young Date of Encounter: 04/05/2020  Primary Cardiologist: Peter [name], MD   Subjective   Patient put out about 1L overnight (330mL not recorded yet). She walked around her room yesterday and felt breathing was much better. No chest pain.   Inpatient Medications    Scheduled Meds: . apixaban  5 mg Oral BID  . atorvastatin  80 mg Oral QHS  . atropine  1 drop Left Eye QHS  . brimonidine  1 drop Both Eyes Daily  . citalopram  20 mg Oral QHS  . dorzolamide-timolol  1 drop Both Eyes BID  . gabapentin  100 mg Oral QHS  . hydrALAZINE  50 mg Oral TID  . latanoprost  1 drop Left Eye QHS  . levothyroxine  112 mcg Oral Q0600  . metoprolol tartrate  25 mg Oral BID  . potassium chloride  40 mEq Oral BID  . sodium chloride flush  3 mL Intravenous Q12H   Continuous Infusions: . sodium chloride    . methocarbamol (ROBAXIN) IV     PRN Meds: sodium chloride, acetaminophen, methocarbamol (ROBAXIN) IV, ondansetron **OR** ondansetron (ZOFRAN) IV, sodium chloride flush   Vital Signs    Vitals:   04/05/20 0303 04/05/20 0500 04/05/20 0552 04/05/20 0600  BP:   119/68   Pulse:   66   Resp: 18  20   Temp:   98 F (36.7 C)   TempSrc:   Oral   SpO2:   96%   Weight:  59.4 kg  63 kg  Height:        Intake/Output Summary (Last 24 hours) at 04/05/2020 0838 Last data filed at 04/04/2020 2240 Gross per 24 hour  Intake 490 ml  Output 650 ml  Net -160 ml   Last 3 Weights 04/05/2020 04/05/2020 04/04/2020  Weight (lbs) 138 lb 14.2 oz 130 lb 15.3 oz 128 lb 9.6 oz  Weight (kg) 63 kg 59.4 kg 58.333 kg      Telemetry    Afib HR 70-80, rare V-paced rhythm - Personally Reviewed  ECG    No new - Personally Reviewed  Physical Exam   GEN: No acute distress.   Neck: + JVD Cardiac: RRR, no murmurs, rubs, or gallops.  Respiratory: crackles at bases, 3L O2 GI: Soft, nontender, non-distended  MS: Trace edema; No deformity. Neuro:  Nonfocal  Psych: Normal  affect   Labs    High Sensitivity Troponin:   Recent Labs  Lab 04/02/20 0200 04/02/20 1651 04/02/20 1833 04/03/20 0000  TROPONINIHS 9 6 6 8       Chemistry Recent Labs  Lab 04/02/20 1651 04/03/20 0559 04/04/20 0354  NA 136 139 135  K 3.9 3.3* 3.4*  CL 104 98 98  CO2 23 28 26   GLUCOSE 117* 102* 102*  BUN 22 21 23   CREATININE 1.15* 1.19* 1.42*  CALCIUM 9.7 9.1 9.1  PROT  --  7.2  --   ALBUMIN  --  3.8  --   AST  --  34  --   ALT  --  28  --   ALKPHOS  --  115  --   BILITOT  --  1.2  --   GFRNONAA 44* 42* 34*  GFRAA 51* 49* 39*  ANIONGAP 9 13 11      Hematology Recent Labs  Lab 04/02/20 1651 04/03/20 0559 04/04/20 0354  WBC 6.3 6.2 6.8  RBC 4.79 4.70 4.49  HGB 14.0 13.6 13.0  HCT 44.1  42.8 40.5  MCV 92.1 91.1 90.2  MCH 29.2 28.9 29.0  MCHC 31.7 31.8 32.1  RDW 15.5 15.4 15.6*  PLT 200 182 178    BNP Recent Labs  Lab 04/02/20 1651  BNP 330.0*     DDimer  Recent Labs  Lab 04/02/20 1941  DDIMER 1.47*     Radiology    ECHOCARDIOGRAM COMPLETE  Result Date: 04/03/2020    ECHOCARDIOGRAM REPORT   Patient Name:   JESICCA DIPIERRO Kunert Date of Exam: 04/03/2020 Medical Rec #:  [id number]      Height:       65.0 in Accession #:    [id number]     Weight:       133.8 lb Date of Birth:  1933-12-08     BSA:          1.667 m Patient Age:    86 years       BP:           151/87 mmHg Patient Gender: F              HR:           71 bpm. Exam Location:  Inpatient Procedure: 2D Echo Indications:    CHF-Acute Diastolic 034.74 / Q59.56  History:        Patient has prior history of Echocardiogram examinations, most                 recent 06/17/2017. CAD, Pacemaker, Arrythmias:Tachycardia and                 Atrial Fibrillation; Risk Factors:Hypertension. Acute                 respiratory failure.  Sonographer:    Vikki Ports Turrentine Referring Phys: [place]  1. Left ventricular ejection fraction, by estimation, is 55 to 60%. The left ventricle has normal  function. The left ventricle has no regional wall motion abnormalities. There is mild concentric left ventricular hypertrophy. Left ventricular diastolic parameters are indeterminate.  2. Right ventricular systolic function is normal. The right ventricular size is normal. There is severely elevated pulmonary artery systolic pressure. The estimated right ventricular systolic pressure is 38.7 mmHg.  3. Right atrial size was mildly dilated.  4. The mitral valve is normal in structure. Mild mitral valve regurgitation. No evidence of mitral stenosis.  5. The aortic valve is tricuspid. Aortic valve regurgitation is trivial. No aortic stenosis is present.  6. The inferior vena cava is dilated in size with <50% respiratory variability, suggesting right atrial pressure of 15 mmHg. FINDINGS  Left Ventricle: Left ventricular ejection fraction, by estimation, is 55 to 60%. The left ventricle has normal function. The left ventricle has no regional wall motion abnormalities. The left ventricular internal cavity size was normal in size. There is  mild concentric left ventricular hypertrophy. Left ventricular diastolic parameters are indeterminate. Right Ventricle: The right ventricular size is normal. No increase in right ventricular wall thickness. Right ventricular systolic function is normal. There is severely elevated pulmonary artery systolic pressure. The tricuspid regurgitant velocity is 3.51 m/s, and with an assumed right atrial pressure of 15 mmHg, the estimated right ventricular systolic pressure is 56.4 mmHg. Left Atrium: Left atrial size was normal in size. Right Atrium: Right atrial size was mildly dilated. Pericardium: There is no evidence of pericardial effusion. Mitral Valve: The mitral valve is normal in structure. Normal mobility of the mitral valve leaflets. Mild mitral valve regurgitation. No evidence of  mitral valve stenosis. Tricuspid Valve: The tricuspid valve is normal in structure. Tricuspid valve  regurgitation is mild . No evidence of tricuspid stenosis. Aortic Valve: The aortic valve is tricuspid. Aortic valve regurgitation is trivial. No aortic stenosis is present. Pulmonic Valve: The pulmonic valve was normal in structure. Pulmonic valve regurgitation is not visualized. No evidence of pulmonic stenosis. Aorta: The aortic root is normal in size and structure. Venous: The inferior vena cava is dilated in size with less than 50% respiratory variability, suggesting right atrial pressure of 15 mmHg. IAS/Shunts: No atrial level shunt detected by color flow Doppler. Additional Comments: A pacer wire is visualized.  LEFT VENTRICLE PLAX 2D LVIDd:         3.90 cm LVIDs:         2.80 cm LV PW:         1.30 cm LV IVS:        1.30 cm LVOT diam:     1.70 cm LV SV:         31 LV SV Index:   19 LVOT Area:     2.27 cm  RIGHT VENTRICLE RV S prime:     8.03 cm/s TAPSE (M-mode): 1.3 cm LEFT ATRIUM             Index       RIGHT ATRIUM           Index LA diam:        3.70 cm 2.22 cm/m  RA Area:     17.70 cm LA Vol (A2C):   28.7 ml 17.21 ml/m RA Volume:   48.60 ml  29.15 ml/m LA Vol (A4C):   41.1 ml 24.65 ml/m LA Biplane Vol: 35.3 ml 21.17 ml/m  AORTIC VALVE LVOT Vmax:   84.90 cm/s LVOT Vmean:  65.500 cm/s LVOT VTI:    0.138 m  AORTA Ao Root diam: 2.60 cm MITRAL VALVE               TRICUSPID VALVE MV Area (PHT): 3.93 cm    TR Peak grad:   49.3 mmHg MV Decel Time: 193 msec    TR Vmax:        351.00 cm/s MV E velocity: 96.80 cm/s MV A velocity: 28.70 cm/s  SHUNTS MV E/A ratio:  3.37        Systemic VTI:  0.14 m                            Systemic Diam: 1.70 cm Skeet Latch MD Electronically signed by Skeet Latch MD Signature Date/Time: 04/03/2020/2:13:50 PM    Final    VAS [place] LOWER EXTREMITY VENOUS (DVT)  Result Date: 04/04/2020  Lower Venous DVTStudy Indications: Swelling.  Comparison Study: no prior Performing Technologist: June Leap RDMS, RVT  Examination Guidelines: A complete evaluation includes B-mode  imaging, spectral Doppler, color Doppler, and power Doppler as needed of all accessible portions of each vessel. Bilateral testing is considered an integral part of a complete examination. Limited examinations for reoccurring indications may be performed as noted. The reflux portion of the exam is performed with the patient in reverse Trendelenburg.  +---------+---------------+---------+-----------+----------+--------------+ RIGHT    CompressibilityPhasicitySpontaneityPropertiesThrombus Aging +---------+---------------+---------+-----------+----------+--------------+ CFV      Full           Yes      Yes                                 +---------+---------------+---------+-----------+----------+--------------+  SFJ      Full                                                        +---------+---------------+---------+-----------+----------+--------------+ FV Prox  Full                                                        +---------+---------------+---------+-----------+----------+--------------+ FV Mid   Full                                                        +---------+---------------+---------+-----------+----------+--------------+ FV DistalFull                                                        +---------+---------------+---------+-----------+----------+--------------+ PFV      Full                                                        +---------+---------------+---------+-----------+----------+--------------+ POP      Full           Yes      Yes                                 +---------+---------------+---------+-----------+----------+--------------+ PTV      Full                                                        +---------+---------------+---------+-----------+----------+--------------+ PERO     Full                                                        +---------+---------------+---------+-----------+----------+--------------+    +---------+---------------+---------+-----------+----------+--------------+ LEFT     CompressibilityPhasicitySpontaneityPropertiesThrombus Aging +---------+---------------+---------+-----------+----------+--------------+ CFV      Full           Yes      Yes                                 +---------+---------------+---------+-----------+----------+--------------+ SFJ      Full                                                        +---------+---------------+---------+-----------+----------+--------------+  FV Prox  Full                                                        +---------+---------------+---------+-----------+----------+--------------+ FV Mid   Full                                                        +---------+---------------+---------+-----------+----------+--------------+ FV DistalFull                                                        +---------+---------------+---------+-----------+----------+--------------+ PFV      Full                                                        +---------+---------------+---------+-----------+----------+--------------+ POP      Full           Yes      Yes                                 +---------+---------------+---------+-----------+----------+--------------+ PTV      Full                                                        +---------+---------------+---------+-----------+----------+--------------+ PERO     Full                                                        +---------+---------------+---------+-----------+----------+--------------+     Summary: RIGHT: - There is no evidence of deep vein thrombosis in the lower extremity.  - No cystic structure found in the popliteal fossa. - Pulsatile venous flow suggestive of increased right sided heart pressure.  LEFT: - There is no evidence of deep vein thrombosis in the lower extremity.  - No cystic structure found in the popliteal fossa. -  Pulsatile venous flow suggestive of increased right sided heart pressure.  *See table(s) above for measurements and observations. Electronically signed by Deitra Mayo MD on 04/04/2020 at 3:25:15 PM.    Final     Cardiac Studies   Echo 04/03/20 1. Left ventricular ejection fraction, by estimation, is 55 to 60%. The  left ventricle has normal function. The left ventricle has no regional  wall motion abnormalities. There is mild concentric left ventricular  hypertrophy. Left ventricular diastolic  parameters are indeterminate.  2. Right ventricular systolic function is normal. The right ventricular  size is normal. There is severely elevated pulmonary artery systolic  pressure. The estimated right ventricular systolic pressure is 40.9 mmHg.  3. Right atrial size was mildly dilated.  4. The mitral valve is normal in structure. Mild mitral valve  regurgitation. No evidence of mitral stenosis.  5. The aortic valve is tricuspid. Aortic valve regurgitation is trivial.  No aortic stenosis is present.  6. The inferior vena cava is dilated in size with <50% respiratory  variability, suggesting right atrial pressure of 15 mmHg.   Patient Profile     87 y.o. female with a hx of deafness, HTN, HLD, DM2, PAD with known occluded popliteal artery (12/2018), sick sinus syndrome s/p PPM in august 2015, diastolic HF with normal EF,CKD stage 2,paroxysmal Afib on eliquiswho is being seen for the evaluation of heart failure.   Assessment & Plan    Acute on chronic diastolic HF - BNP 811 and cxr with cardiomegaly with possible mild interstitial edema and trace right pleural effusion. Hs trop negative - Echo showed EF 55-60%, no RWMA, mild LVH, RA mildly dilated, mild MR, trivial AR - started on IV lasix 80 mg  - patient put out 1L overnight, -6.3Lsince admission  - Weights are not accurate - Oxygen requirement is improving. Patient feels breathing is getting better. -  creatinine1.15>1.19>1.42. AM labs pending - On exam patient is still volume up. Plan to switch to oral lasix today.  - continue to wean oxygen  HLD - continue Lipitor - LDL92  HTN - home meds restarted. Hydralazine 50 mg TID,lisinopril40 mg daily, Lopressor 25 mg BID - IV lasix - Lisinopril held for worsening kidney function - pressures stable  Paroxysmal Afib - Eliquis for stroke ppx - rate controlled with Lopressor - Can consider DCCV as outpatient or if shortness of breath does not improve while admitted - patient has PPM  Hypokalemia - 16meq daily - AM labs pending  For questions or updates, please contact [facility] Please consult [email or web address] for contact info under        Signed, Tanishia Lemaster Ninfa Meeker, PA-C  04/05/2020, 8:38 AM

## 2020-04-05 NOTE — Plan of Care (Signed)
  Problem: Clinical Measurements: Goal: Ability to maintain clinical measurements within normal limits will improve Outcome: Progressing   Problem: Clinical Measurements: Goal: Will remain free from infection Outcome: Progressing   Problem: Clinical Measurements: Goal: Respiratory complications will improve Outcome: Progressing   Problem: Clinical Measurements: Goal: Cardiovascular complication will be avoided Outcome: Progressing   Problem: Skin Integrity: Goal: Risk for impaired skin integrity will decrease Outcome: Progressing   Problem: Safety: Goal: Ability to remain free from injury will improve Outcome: Progressing   Problem: Cardiac: Goal: Ability to achieve and maintain adequate cardiopulmonary perfusion will improve Outcome: Progressing

## 2020-04-05 NOTE — Progress Notes (Deleted)
Pt little anxious earlier, maybe "[place]", restless in bed. Attempted to get Out bed, pt husband at bedside, trying to assist pt. Pt is beginning to settle however is aware husband is leaving soon. Will cont to monitor. SRP,RN

## 2020-04-06 DIAGNOSIS — I509 Heart failure, unspecified: Secondary | ICD-10-CM | POA: Diagnosis not present

## 2020-04-06 DIAGNOSIS — J9601 Acute respiratory failure with hypoxia: Secondary | ICD-10-CM | POA: Diagnosis not present

## 2020-04-06 DIAGNOSIS — I10 Essential (primary) hypertension: Secondary | ICD-10-CM | POA: Diagnosis not present

## 2020-04-06 DIAGNOSIS — K219 Gastro-esophageal reflux disease without esophagitis: Secondary | ICD-10-CM | POA: Diagnosis not present

## 2020-04-06 LAB — BASIC METABOLIC PANEL
Anion gap: 12 (ref 5–15)
BUN: 31 mg/dL — ABNORMAL HIGH (ref 8–23)
CO2: 25 mmol/L (ref 22–32)
Calcium: 8.9 mg/dL (ref 8.9–10.3)
Chloride: 98 mmol/L (ref 98–111)
Creatinine, Ser: 1.39 mg/dL — ABNORMAL HIGH (ref 0.44–1.00)
GFR calc Af Amer: 40 mL/min — ABNORMAL LOW (ref >60)
GFR calc non Af Amer: 35 mL/min — ABNORMAL LOW (ref >60)
Glucose, Bld: 107 mg/dL — ABNORMAL HIGH (ref 70–99)
Potassium: 4.5 mmol/L (ref 3.5–5.1)
Sodium: 135 mmol/L (ref 135–145)

## 2020-04-06 LAB — CBC
HCT: 41.9 % (ref 36.0–46.0)
Hemoglobin: 13.1 g/dL (ref 12.0–15.0)
MCH: 28.7 pg (ref 26.0–34.0)
MCHC: 31.3 g/dL (ref 30.0–36.0)
MCV: 91.9 fL (ref 80.0–100.0)
Platelets: 196 10*3/uL (ref 150–400)
RBC: 4.56 MIL/uL (ref 3.87–5.11)
RDW: 15.5 % (ref 11.5–15.5)
WBC: 6.4 10*3/uL (ref 4.0–10.5)
nRBC: 0 % (ref 0.0–0.2)

## 2020-04-06 LAB — MAGNESIUM: Magnesium: 1.9 mg/dL (ref 1.7–2.4)

## 2020-04-06 LAB — T3, FREE: T3, Free: 1.9 pg/mL — ABNORMAL LOW (ref 2.0–4.4)

## 2020-04-06 MED ORDER — FUROSEMIDE 20 MG PO TABS: 20.0000 mg | ORAL_TABLET | Freq: Every day | ORAL | 1 refills | Status: DC

## 2020-04-06 NOTE — Plan of Care (Signed)

## 2020-04-06 NOTE — Progress Notes (Signed)
Pt discharged to home instructions reviewed with daughter and pt. Acknowledged understanding. Appropriate accommodations met for the pt to meet her learning deficit. All questions addressed. Pt equipment delivered at bedside with daughter available to accept. SRP, RN

## 2020-04-06 NOTE — Discharge Summary (Signed)
Physician Discharge Summary  Veronica Young YSA:[id number] DOB: April 20, 1933 DOA: 04/02/2020  PCP: Katherina Mires, MD  Admit date: 04/02/2020 Discharge date: 04/06/2020  Time spent: 60 minutes  Recommendations for Outpatient Follow-up:  1. Follow-up with Coletta Memos, NP cardiology on 04/18/2020.  On follow-up if patient still in atrial fibrillation consider outpatient cardioversion.  Patient will need a basic metabolic profile done to follow-up on electrolytes and renal function.  Patient's heart failure need to be reassessed. 2. Follow-up with Katherina Mires, MD in 2 weeks.  On follow-up patient needs a basic metabolic profile done to follow-up on electrolytes and renal function.  Patient will need ambulatory sats done to see whether patient still requires ongoing home O2.  Patient will need repeat thyroid function studies done in about 4 to 6 weeks.   Discharge Diagnoses:  Principal Problem:   Acute respiratory failure with hypoxia (HCC) Active Problems:   Acute on chronic diastolic CHF (congestive heart failure) (HCC)   PAF (paroxysmal atrial fibrillation) (HCC)   Hypercholesterolemia   GERD (gastroesophageal reflux disease)   Hypothyroidism   PAD (peripheral artery disease) (HCC)   HTN (hypertension)   Acute respiratory failure (HCC)   Acute on chronic congestive heart failure ([place])   Discharge Condition: Stable and improved  Diet recommendation: Heart healthy  Filed Weights   04/05/20 0500 04/05/20 0600 04/06/20 0532  Weight: 59.4 kg 63 kg 59.3 kg    History of present illness:  HPI per Dr. Myna Hidalgo Joost is a 86 y.o. female with medical history significant of deafness,  hypertension, hyperlipidemia, permanent transvenous pacemaker insertion 8/15, paroxysmal atrial fibrillation on Eliquis,and diastolic heart failure with a normal EF, PAD  anemia, chronic back pain   Presented with   3 d hx of gradual worsening shortness of breath no dizziness no lightheadedness  did not syncopized no palpitations denies any chest pain no fevers or chills reported bilateral lower extremity swelling. She did report taking medications.  Infectious risk factors:  Reports  shortness of breath,     Has  been vaccinated against COVID x2 end of  March   in house  PCR testing  Pending Hospital Course:  1 acute hypoxemic respiratory failure new onset, patient required up to 10 L of oxygen on admission Secondary to acute diastolic CHF exacerbation in the setting of persistent atrial fibrillation.    Patient was placed on IV Lasix 80 mg twice daily during the hospitalization with good urine output and clinical improvement.  Patient was -6.120 L during this hospitalization.  Patient's O2 was titrated during the hospitalization and hypoxia improved.  On day of discharge patient with sats of 92 to 94% on room air with sats dropping to 86% on room air with ambulation.  Patient be discharged home on home O2 and will need close follow-up with PCP.  On follow-up patient will need a ambulatory sats check.  Patient is IV Lasix was transitioned to oral Lasix 20 mg daily per cardiology recommendations.  Outpatient follow-up with cardiology.   2.  Acute diastolic CHF exacerbation Questionable etiology. Could be secondary to persistent A. fib.  Patient was on Lasix 80 mg IV twice daily.  Patient with good diuresis during the hospitalization and patient was -6.120 L during this hospitalization.  Patient improved clinically.  O2 requirements improved.  Patient was subsequently transition from IV Lasix to oral Lasix at 20 mg daily.  Patient also maintained on Lopressor, hydralazine, Lipitor.  ACE inhibitor discontinued due to increasing creatinine.  Patient was followed by cardiology throughout the hospitalization.  Close outpatient follow-up with cardiology.    3.  Persistent atrial fibrillation status post PPM Patient maintained on metoprolol for rate control.  Eliquis for anticoagulation.   Patient remained in persistent A. fib during the hospitalization.  Outpatient follow-up with cardiology and on follow-up may consider outpatient cardioversion in the outpatient setting.  Cardiology followed the patient throughout the hospitalization.   4.  Gastroesophageal reflux disease Patient maintained on PPI.  5.  Hyperlipidemia Patient maintained on home regimen statin.  Outpatient follow-up.   6.  Hypertension Patient maintained on hydralazine and Lopressor.  ACE inhibitor discontinued due to increasing creatinine.  Outpatient follow-up with PCP and cardiology.   7.  Hypothyroidism Patient maintained on home regimen of Synthroid. Will need repeat thyroid function studies done in the outpatient setting in 4 to 6 weeks.  8.  Elevated D-dimer/lower extremity edema Lower extremity Dopplers negative for DVT.  Patient already on anticoagulation.  Lower extremity edema improved with diuresis and had resolved by day of discharge.  Outpatient follow-up.  9.  Hypokalemia Secondary to diuresis.  Patient was on scheduled oral potassium supplementation while on IV Lasix with resolution of hypokalemia.  Patient has been transitioned to Lasix 20 mg daily on discharge.  Will need repeat labs on follow-up.  10.  Diarrhea Ongoing for the past 2 weeks.  Stool consistency improved during the hospitalization and patient had formed stools by day of discharge.  Outpatient follow-up.    Procedures:  2D echo 04/03/2020  Lower extremity Dopplers 04/03/2020  Chest x-ray 04/02/2020, 04/03/2020, 04/05/2020  Consultations:  Cardiology: Dr. Audie Box 04/03/2020  Discharge Exam: Vitals:   04/06/20 1249 04/06/20 1417  BP: 97/62   Pulse: 75   Resp: 19   Temp: 97.6 F (36.4 C)   SpO2: 98% (!) 85%    General: NAD Cardiovascular: Irregularly irregular. Respiratory: Clear to auscultation bilaterally.  No wheezes, no crackles, no rhonchi.  Normal respiratory effort.  Speaking in full  sentences.  Discharge Instructions   Discharge Instructions    Diet - low sodium heart healthy   Complete by: As directed    Increase activity slowly   Complete by: As directed      Allergies as of 04/06/2020      Reactions   Amlodipine Nausea Only      Benicar [olmesartan Medoxomil] Nausea Only      Medication List    STOP taking these medications   lisinopril 40 MG tablet Commonly known as: ZESTRIL     TAKE these medications   apixaban 5 MG Tabs tablet Commonly known as: Eliquis Take 1 tablet (5 mg total) by mouth 2 (two) times daily.   atorvastatin 80 MG tablet Commonly known as: LIPITOR Take 80 mg by mouth at bedtime.   atropine 1 % ophthalmic solution Place 1 drop into the left eye at bedtime.   brimonidine 0.2 % ophthalmic solution Commonly known as: ALPHAGAN Place 1 drop into both eyes daily.   calcium-vitamin D 500-200 MG-UNIT tablet Commonly known as: OSCAL WITH D Take 1 tablet by mouth daily with breakfast.   cholecalciferol 1000 units tablet Commonly known as: VITAMIN D Take 1,000 Units by mouth daily.   citalopram 20 MG tablet Commonly known as: CELEXA Take 20 mg by mouth at bedtime.   dorzolamide-timolol 22.3-6.8 MG/ML ophthalmic solution Commonly known as: COSOPT Place 1 drop into both eyes 2 (two) times daily.   fluorometholone 0.1 % ophthalmic suspension Commonly known as:  FML Place 1 drop into the left eye 4 (four) times daily.   furosemide 20 MG tablet Commonly known as: LASIX Take 1 tablet (20 mg total) by mouth daily. Start taking on: April 07, 2020   gabapentin 100 MG capsule Commonly known as: NEURONTIN Take 100 mg by mouth daily.   hydrALAZINE 50 MG tablet Commonly known as: APRESOLINE Take 50 mg by mouth 3 (three) times daily.   latanoprost 0.005 % ophthalmic solution Commonly known as: XALATAN Place 1 drop into both eyes daily.   levothyroxine 112 MCG tablet Commonly known as: SYNTHROID Take 112 mcg by mouth  daily before breakfast.   metoprolol tartrate 25 MG tablet Commonly known as: LOPRESSOR TAKE 1 TABLET BY MOUTH 2 TIMES DAILY. TAKE EXTRA 25MG  ( 1 TABLET) FOR PALPITATIONS AS NEEDED.   pantoprazole 40 MG tablet Commonly known as: PROTONIX Take 40 mg by mouth daily.   Rhopressa 0.02 % Soln Generic drug: Netarsudil Dimesylate Place 1 drop into the left eye at bedtime.            Durable Medical Equipment  (From admission, onward)         Start     Ordered   04/06/20 1206  For home use only DME oxygen  Once    Question Answer Comment  Length of Need 6 Months   Mode or (Route) Nasal cannula   Liters per Minute 2   Frequency Continuous (stationary and portable oxygen unit needed)   Oxygen conserving device Yes   Oxygen delivery system Gas      04/06/20 1206   04/05/20 2008  For home use only DME 4 wheeled rolling walker with seat  Once    Question:  Patient needs a walker to treat with the following condition  Answer:  Debility   04/05/20 2007         Allergies  Allergen Reactions  . Amlodipine Nausea Only       . Benicar [Olmesartan Medoxomil] Nausea Only   Follow-up Information    Deberah Pelton, NP Follow up on 04/18/2020.   Specialty: Cardiology Why: Hospital follow up 4/27 at 8:15AM Contact information: [address] [place] [address] [phone]        Katherina Mires, MD. Schedule an appointment as soon as possible for a visit in 2 week(s).   Specialty: Family Medicine Contact information: [place] [address] [phone]        [name], Peter M, MD .   Specialty: Cardiology Contact information: [address] [place] [address] [phone]            The results of significant diagnostics from this hospitalization (including imaging, microbiology, ancillary and laboratory) are listed below for reference.    Significant Diagnostic Studies: DG Chest 2  View  Result Date: 04/05/2020 CLINICAL DATA:  Elevated BNP hypoxia EXAM: CHEST - 2 VIEW COMPARISON:  04/03/2020, CT chest 07/07/2011 FINDINGS: Left-sided pacing device as before. No consolidation or pleural effusion. Mild cardiomegaly. No overt edema or pneumothorax. Mild scoliosis. IMPRESSION: Mild cardiomegaly without overt edema. Electronically Signed   By: Donavan Foil M.D.   On: 04/05/2020 19:40   DG Chest 2 View  Result Date: 04/02/2020 CLINICAL DATA:  Shortness of breath EXAM: CHEST - 2 VIEW COMPARISON:  06/16/2017 FINDINGS: Cardiomegaly with pulmonary vascular congestion and possible mild interstitial edema. Mild patchy bilateral lower lobe opacities, atelectasis versus pneumonia. Suspected trace right pleural effusion. No pneumothorax.  Left subclavian pacemaker. IMPRESSION: Cardiomegaly with possible mild interstitial edema and trace right pleural effusion. Mild patchy bilateral lower lobe opacities, atelectasis versus pneumonia. Electronically Signed   By: Julian Hy M.D.   On: 04/02/2020 16:12   DG CHEST PORT 1 VIEW  Result Date: 04/03/2020 CLINICAL DATA:  Hypoxia EXAM: PORTABLE CHEST 1 VIEW COMPARISON:  04/02/2020, 06/16/2017 FINDINGS: Left-sided pacing device as before. Cardiomegaly. Minimal basilar atelectasis. No significant pleural effusion. IMPRESSION: No active disease.  Cardiomegaly with mild basilar atelectasis Electronically Signed   By: Donavan Foil M.D.   On: 04/03/2020 01:38   ECHOCARDIOGRAM COMPLETE  Result Date: 04/03/2020    ECHOCARDIOGRAM REPORT   Patient Name:   BRECK HOLLINGER Stennett Date of Exam: 04/03/2020 Medical Rec #:  [id number]      Height:       65.0 in Accession #:    [id number]     Weight:       133.8 lb Date of Birth:  [birth year]     BSA:          1.667 m Patient Age:    55 years       BP:           151/87 mmHg Patient Gender: F              HR:           71 bpm. Exam Location:  Inpatient Procedure: 2D Echo Indications:    CHF-Acute Diastolic 412.87 / O67.67   History:        Patient has prior history of Echocardiogram examinations, most                 recent 06/17/2017. CAD, Pacemaker, Arrythmias:Tachycardia and                 Atrial Fibrillation; Risk Factors:Hypertension. Acute                 respiratory failure.  Sonographer:    Vikki Ports Turrentine Referring Phys: [place]  1. Left ventricular ejection fraction, by estimation, is 55 to 60%. The left ventricle has normal function. The left ventricle has no regional wall motion abnormalities. There is mild concentric left ventricular hypertrophy. Left ventricular diastolic parameters are indeterminate.  2. Right ventricular systolic function is normal. The right ventricular size is normal. There is severely elevated pulmonary artery systolic pressure. The estimated right ventricular systolic pressure is 20.9 mmHg.  3. Right atrial size was mildly dilated.  4. The mitral valve is normal in structure. Mild mitral valve regurgitation. No evidence of mitral stenosis.  5. The aortic valve is tricuspid. Aortic valve regurgitation is trivial. No aortic stenosis is present.  6. The inferior vena cava is dilated in size with <50% respiratory variability, suggesting right atrial pressure of 15 mmHg. FINDINGS  Left Ventricle: Left ventricular ejection fraction, by estimation, is 55 to 60%. The left ventricle has normal function. The left ventricle has no regional wall motion abnormalities. The left ventricular internal cavity size was normal in size. There is  mild concentric left ventricular hypertrophy. Left ventricular diastolic parameters are indeterminate. Right Ventricle: The right ventricular size is normal. No increase in right ventricular wall thickness. Right ventricular systolic function is normal. There is severely elevated pulmonary artery systolic pressure. The tricuspid regurgitant velocity is 3.51 m/s, and with an assumed right atrial pressure of 15 mmHg, the estimated right ventricular  systolic pressure is 47.0 mmHg. Left Atrium: Left atrial size was normal in  size. Right Atrium: Right atrial size was mildly dilated. Pericardium: There is no evidence of pericardial effusion. Mitral Valve: The mitral valve is normal in structure. Normal mobility of the mitral valve leaflets. Mild mitral valve regurgitation. No evidence of mitral valve stenosis. Tricuspid Valve: The tricuspid valve is normal in structure. Tricuspid valve regurgitation is mild . No evidence of tricuspid stenosis. Aortic Valve: The aortic valve is tricuspid. Aortic valve regurgitation is trivial. No aortic stenosis is present. Pulmonic Valve: The pulmonic valve was normal in structure. Pulmonic valve regurgitation is not visualized. No evidence of pulmonic stenosis. Aorta: The aortic root is normal in size and structure. Venous: The inferior vena cava is dilated in size with less than 50% respiratory variability, suggesting right atrial pressure of 15 mmHg. IAS/Shunts: No atrial level shunt detected by color flow Doppler. Additional Comments: A pacer wire is visualized.  LEFT VENTRICLE PLAX 2D LVIDd:         3.90 cm LVIDs:         2.80 cm LV PW:         1.30 cm LV IVS:        1.30 cm LVOT diam:     1.70 cm LV SV:         31 LV SV Index:   19 LVOT Area:     2.27 cm  RIGHT VENTRICLE RV S prime:     8.03 cm/s TAPSE (M-mode): 1.3 cm LEFT ATRIUM             Index       RIGHT ATRIUM           Index LA diam:        3.70 cm 2.22 cm/m  RA Area:     17.70 cm LA Vol (A2C):   28.7 ml 17.21 ml/m RA Volume:   48.60 ml  29.15 ml/m LA Vol (A4C):   41.1 ml 24.65 ml/m LA Biplane Vol: 35.3 ml 21.17 ml/m  AORTIC VALVE LVOT Vmax:   84.90 cm/s LVOT Vmean:  65.500 cm/s LVOT VTI:    0.138 m  AORTA Ao Root diam: 2.60 cm MITRAL VALVE               TRICUSPID VALVE MV Area (PHT): 3.93 cm    TR Peak grad:   49.3 mmHg MV Decel Time: 193 msec    TR Vmax:        351.00 cm/s MV E velocity: 96.80 cm/s MV A velocity: 28.70 cm/s  SHUNTS MV E/A ratio:  3.37         Systemic VTI:  0.14 m                            Systemic Diam: 1.70 cm Skeet Latch MD Electronically signed by Skeet Latch MD Signature Date/Time: 04/03/2020/2:13:50 PM    Final    VAS [place] LOWER EXTREMITY VENOUS (DVT)  Result Date: 04/04/2020  Lower Venous DVTStudy Indications: Swelling.  Comparison Study: no prior Performing Technologist: June Leap RDMS, RVT  Examination Guidelines: A complete evaluation includes B-mode imaging, spectral Doppler, color Doppler, and power Doppler as needed of all accessible portions of each vessel. Bilateral testing is considered an integral part of a complete examination. Limited examinations for reoccurring indications may be performed as noted. The reflux portion of the exam is performed with the patient in reverse Trendelenburg.  +---------+---------------+---------+-----------+----------+--------------+ RIGHT    CompressibilityPhasicitySpontaneityPropertiesThrombus Aging +---------+---------------+---------+-----------+----------+--------------+ CFV  Full           Yes      Yes                                 +---------+---------------+---------+-----------+----------+--------------+ SFJ      Full                                                        +---------+---------------+---------+-----------+----------+--------------+ FV Prox  Full                                                        +---------+---------------+---------+-----------+----------+--------------+ FV Mid   Full                                                        +---------+---------------+---------+-----------+----------+--------------+ FV DistalFull                                                        +---------+---------------+---------+-----------+----------+--------------+ PFV      Full                                                        +---------+---------------+---------+-----------+----------+--------------+ POP      Full            Yes      Yes                                 +---------+---------------+---------+-----------+----------+--------------+ PTV      Full                                                        +---------+---------------+---------+-----------+----------+--------------+ PERO     Full                                                        +---------+---------------+---------+-----------+----------+--------------+   +---------+---------------+---------+-----------+----------+--------------+ LEFT     CompressibilityPhasicitySpontaneityPropertiesThrombus Aging +---------+---------------+---------+-----------+----------+--------------+ CFV      Full           Yes      Yes                                 +---------+---------------+---------+-----------+----------+--------------+  SFJ      Full                                                        +---------+---------------+---------+-----------+----------+--------------+ FV Prox  Full                                                        +---------+---------------+---------+-----------+----------+--------------+ FV Mid   Full                                                        +---------+---------------+---------+-----------+----------+--------------+ FV DistalFull                                                        +---------+---------------+---------+-----------+----------+--------------+ PFV      Full                                                        +---------+---------------+---------+-----------+----------+--------------+ POP      Full           Yes      Yes                                 +---------+---------------+---------+-----------+----------+--------------+ PTV      Full                                                        +---------+---------------+---------+-----------+----------+--------------+ PERO     Full                                                         +---------+---------------+---------+-----------+----------+--------------+     Summary: RIGHT: - There is no evidence of deep vein thrombosis in the lower extremity.  - No cystic structure found in the popliteal fossa. - Pulsatile venous flow suggestive of increased right sided heart pressure.  LEFT: - There is no evidence of deep vein thrombosis in the lower extremity.  - No cystic structure found in the popliteal fossa. - Pulsatile venous flow suggestive of increased right sided heart pressure.  *See table(s) above for measurements and observations. Electronically signed by Deitra Mayo MD on 04/04/2020 at 3:25:15 PM.    Final     Microbiology: Recent Results (from the past 240 hour(s))  SARS CORONAVIRUS 2 (TAT 6-24 HRS) Nasopharyngeal Nasopharyngeal Swab     Status: None   Collection Time: 04/02/20  4:51 PM   Specimen: Nasopharyngeal Swab  Result Value Ref Range Status   SARS Coronavirus 2 NEGATIVE NEGATIVE Final    Comment: (NOTE) SARS-CoV-2 target nucleic acids are NOT DETECTED. The SARS-CoV-2 RNA is generally detectable in upper and lower respiratory specimens during the acute phase of infection. Negative results do not preclude SARS-CoV-2 infection, do not rule out co-infections with other pathogens, and should not be used as the sole basis for treatment or other patient management decisions. Negative results must be combined with clinical observations, patient history, and epidemiological information. The expected result is Negative. Fact Sheet for Patients: SugarRoll.be Fact Sheet for Healthcare Providers: https://www.woods-mathews.com/ This test is not yet approved or cleared by the [place] FDA and  has been authorized for detection and/or diagnosis of SARS-CoV-2 by FDA under an Emergency Use Authorization (EUA). This EUA will remain  in effect (meaning this test can be used) for the duration of the COVID-19  declaration under Section 56 4(b)(1) of the Act, 21 U.S.C. section 360bbb-3(b)(1), unless the authorization is terminated or revoked sooner. Performed at [facility], [place] [address] [place], [place] [address]      Labs: Basic Metabolic Panel: Recent Labs  Lab 04/02/20 1651 04/03/20 0559 04/04/20 0354 04/05/20 0754 04/06/20 0408  NA 136 139 135 136 135  K 3.9 3.3* 3.4* 4.6 4.5  CL 104 98 98 97* 98  CO2 23 28 26 30 25   GLUCOSE 117* 102* 102* 121* 107*  BUN 22 21 23  27* 31*  CREATININE 1.15* 1.19* 1.42* 1.54* 1.39*  CALCIUM 9.7 9.1 9.1 9.3 8.9  MG  --  1.8  --   --  1.9  PHOS  --  4.0  --   --   --    Liver Function Tests: Recent Labs  Lab 04/03/20 0559  AST 34  ALT 28  ALKPHOS 115  BILITOT 1.2  PROT 7.2  ALBUMIN 3.8   No results for input(s): LIPASE, AMYLASE in the last 168 hours. No results for input(s): AMMONIA in the last 168 hours. CBC: Recent Labs  Lab 04/02/20 1651 04/03/20 0559 04/04/20 0354 04/06/20 0408  WBC 6.3 6.2 6.8 6.4  NEUTROABS 4.7  --   --   --   HGB 14.0 13.6 13.0 13.1  HCT 44.1 42.8 40.5 41.9  MCV 92.1 91.1 90.2 91.9  PLT 200 182 178 196   Cardiac Enzymes: No results for input(s): CKTOTAL, CKMB, CKMBINDEX, TROPONINI in the last 168 hours. BNP: BNP (last 3 results) Recent Labs    04/02/20 1651  BNP 330.0*    ProBNP (last 3 results) No results for input(s): PROBNP in the last 8760 hours.  CBG: No results for input(s): GLUCAP in the last 168 hours.     Signed:  Irine Seal MD.  [facility] 04/06/2020, 3:20 PM

## 2020-04-06 NOTE — Progress Notes (Addendum)
SATURATION QUALIFICATIONS: (This note is used to comply with regulatory documentation for home oxygen)  Patient Saturations on Room Air at Rest 91%  Patient Saturations on Room Air while Ambulating 86%  Patient Saturations on 1.5 Liters of oxygen while Ambulating 92  Please briefly explain why patient needs home oxygen: pt took a brief rest break while walking to return to room.

## 2020-04-06 NOTE — Progress Notes (Signed)
Pt ambulated around the unit about 180 feet. Pt walked without oxygen sat decreased to 86% and took at short break before returning to room. Pt was placed on Oxygen at 1.5 liters sats increased to 92% while walking. Pt returned to room, remained on O2 sat at 94% on 1.5 liters until she recovered. Please tolerated walking sats well without acute distress. SRP, RN

## 2020-04-06 NOTE — Progress Notes (Signed)
Occupational Therapy Treatment Patient Details Name: Veronica Young MRN: [id number] DOB: 01-02-1933 Today's Date: 04/06/2020    History of present illness 86 yo female admitted with acute respiratory failure possibly due to HF. Hx of deafness, CAD, CHF, A fib, PAD, SSS-pacemaker, DM   OT comments  Pt progressing towards OT goals this session. Agreeable to functional ADL in room, while on RA Pt de-saturated to 87%, required 1 L O2 and PLB exercises in seated position and then SpO2 returned to 91%. Pt min guard for tasks at sink. Pt seated in recliner at end of session and eating lunch - no problem opening containers. Current POC remains appropriate at this time. Continue to progress activity tolerance and access to LB for ADL as well as challenge and progress balance.    Follow Up Recommendations  Home health OT;Supervision/Assistance - 24 hour    Equipment Recommendations  None recommended by OT    Recommendations for Other Services      Precautions / Restrictions Precautions Precautions: Fall Precaution Comments: monitor O2 Restrictions Weight Bearing Restrictions: No       Mobility Bed Mobility               General bed mobility comments: OOB in recliner at beginning and end of session  Transfers Overall transfer level: Needs assistance Equipment used: Rolling walker (2 wheeled) Transfers: Sit to/from Stand Sit to Stand: Min guard         General transfer comment: for safety.    Balance Overall balance assessment: Mild deficits observed, not formally tested                                         ADL either performed or assessed with clinical judgement   ADL Overall ADL's : Needs assistance/impaired Eating/Feeding: Modified independent;Sitting Eating/Feeding Details (indicate cue type and reason): eating lunch at end of session Grooming: Wash/dry hands;Min guard;Standing Grooming Details (indicate cue type and reason): sink in room                 Toilet Transfer: Minimal assistance;Min guard;Ambulation;RW   Toileting- Clothing Manipulation and Hygiene: Min guard;Sitting/lateral lean       Functional mobility during ADLs: Min guard;Rolling walker;Minimal assistance General ADL Comments: daughter will provide A as needed     Vision       Perception     Praxis      Cognition Arousal/Alertness: Awake/alert Behavior During Therapy: WFL for tasks assessed/performed Overall Cognitive Status: Within Functional Limits for tasks assessed                                          Exercises     Shoulder Instructions       General Comments      Pertinent Vitals/ Pain       Pain Assessment: No/denies pain  Home Living                                          Prior Functioning/Environment              Frequency  Min 2X/week        Progress Toward Goals  OT Goals(current goals  can now be found in the care plan section)  Progress towards OT goals: Progressing toward goals  Acute Rehab OT Goals Patient Stated Goal: to get better/stronger OT Goal Formulation: With patient Time For Goal Achievement: 04/17/20 Potential to Achieve Goals: Good  Plan Discharge plan remains appropriate;Frequency remains appropriate    Co-evaluation                 AM-PAC OT "6 Clicks" Daily Activity     Outcome Measure   Help from another person eating meals?: None Help from another person taking care of personal grooming?: A Little Help from another person toileting, which includes using toliet, bedpan, or urinal?: A Little Help from another person bathing (including washing, rinsing, drying)?: A Little Help from another person to put on and taking off regular upper body clothing?: A Little Help from another person to put on and taking off regular lower body clothing?: A Lot 6 Click Score: 18    End of Session Equipment Utilized During Treatment: Gait  belt;Rolling walker  OT Visit Diagnosis: Unsteadiness on feet (R26.81);Other abnormalities of gait and mobility (R26.89);Muscle weakness (generalized) (M62.81)   Activity Tolerance Patient tolerated treatment well   Patient Left in chair;with call bell/phone within reach;with chair alarm set   Nurse Communication Mobility status        Time: 4818-5631 OT Time Calculation (min): 23 min  Charges: OT General Charges $OT Visit: 1 Visit OT Treatments $Self Care/Home Management : 8-22 mins  Jesse Sans OTR/L Acute Rehabilitation Services Pager: [phone] Office: [place] 04/06/2020, 1:16 PM

## 2020-04-06 NOTE — Progress Notes (Signed)
Physical Therapy Treatment Patient Details Name: Veronica Young MRN: [id number] DOB: 1933/07/27 Today's Date: 04/06/2020    History of Present Illness 86 yo female admitted with acute respiratory failure possibly due to HF. Hx of deafness, CAD, CHF, A fib, PAD, SSS-pacemaker, DM    PT Comments    Pt is making steady progress with mobility. Pt's daughter was present to interpret and the beginning of the session. Pt O2 sats were closely monitored during the session. O2 sats 100% RA during most of the walk then suddenly dropped to 85% with no symptoms of SOB. Returned pt to the chair and O2 applied and sats returned to 94% on 2 liters. Pt will have assistance at home if needed. Recommend continued acute PT to maximize mobility and independence for return home.   Follow Up Recommendations  Home health PT;Supervision/Assistance - 24 hour     Equipment Recommendations  Rolling walker with 5" wheels    Recommendations for Other Services       Precautions / Restrictions Precautions Precautions: Fall Precaution Comments: monitor O2 Restrictions Weight Bearing Restrictions: No    Mobility  Bed Mobility               General bed mobility comments: in recliner  Transfers Overall transfer level: Modified independent Equipment used: Rolling walker (2 wheeled) Transfers: Sit to/from Stand Sit to Stand: Modified independent (Device/Increase time)         General transfer comment: for safety.  Ambulation/Gait Ambulation/Gait assistance: Supervision Gait Distance (Feet): 200 Feet Assistive device: Rolling walker (2 wheeled) Gait Pattern/deviations: Step-through pattern;Decreased stride length Gait velocity: decreased   General Gait Details: monitored O2 sats. 100% at RA throughout most of walk then suddly dropped to 85 % with no symptoms and SOB noted. Put O2 back on and recovered to 95% on 2 liters   Stairs             Wheelchair Mobility    Modified Rankin  (Stroke Patients Only)       Balance Overall balance assessment: Mild deficits observed, not formally tested                                          Cognition Arousal/Alertness: Awake/alert Behavior During Therapy: WFL for tasks assessed/performed Overall Cognitive Status: Within Functional Limits for tasks assessed                                 General Comments: appears WFL with use of interpreter      Exercises      General Comments        Pertinent Vitals/Pain Pain Assessment: No/denies pain    Home Living                      Prior Function            PT Goals (current goals can now be found in the care plan section) Acute Rehab PT Goals Patient Stated Goal: to get better/stronger Progress towards PT goals: Progressing toward goals    Frequency    Min 3X/week      PT Plan Current plan remains appropriate    Co-evaluation              AM-PAC PT "6 Clicks" Mobility   Outcome Measure  Help  needed turning from your back to your side while in a flat bed without using bedrails?: A Little Help needed moving from lying on your back to sitting on the side of a flat bed without using bedrails?: A Little Help needed moving to and from a bed to a chair (including a wheelchair)?: A Little Help needed standing up from a chair using your arms (e.g., wheelchair or bedside chair)?: A Little Help needed to walk in hospital room?: A Little Help needed climbing 3-5 steps with a railing? : A Little 6 Click Score: 18    End of Session Equipment Utilized During Treatment: Gait belt Activity Tolerance: Patient tolerated treatment well Patient left: in chair;with call bell/phone within reach Nurse Communication: Mobility status PT Visit Diagnosis: Unsteadiness on feet (R26.81);Muscle weakness (generalized) (M62.81)     Time: 2574-9355 PT Time Calculation (min) (ACUTE ONLY): 30 min  Charges:  $Gait Training: 23-37  mins                     Theodoro Grist, PT   Lelon Mast 04/06/2020, 2:24 PM

## 2020-04-07 ENCOUNTER — Telehealth: Payer: Self-pay | Admitting: Cardiology

## 2020-04-07 NOTE — Telephone Encounter (Signed)
Spoke with daughter.  Advised that she check pt BP 2-3 times daily and if systolic < 081, hold next dose of hydralazine.  Daughter voiced understanding.

## 2020-04-07 NOTE — Telephone Encounter (Signed)
Pt c/o BP issue: STAT if pt c/o blurred vision, one-sided weakness or slurred speech  1. What are your last 5 BP readings? 94/57  2. Are you having any other symptoms (ex. Dizziness, headache, blurred vision, passed out)? Blurred vision  3. What is your BP issue? Patient's daughter states the patient just got home from the hospital last night and today her BP is very low. She states the patient did not mention having any blurred vision while at the hospital, but say she has it today. She states the patient is with her now.

## 2020-04-07 NOTE — Telephone Encounter (Signed)
Contacted patient- she states that her mother was discharged yesterday from the hospital- and she was given lasix to take, stopped lisinopril. She states that her BP about 30 minutes ago was 94/57, and she only gave her 1 hydrlazine and 1 metoprolol she is suppose to take multiples of these medications and the daughter is afraid to give her anything else. Patient daughter states that she is staying hydrated and that she has at breakfast and working on eating lunch.  I advised the daughter if her mother started having any lower BP, or if the vision did not improve that she should call EMS. Advised I would route a message to [facility] and Dr.Jordan to advise.

## 2020-04-10 ENCOUNTER — Telehealth: Payer: Self-pay | Admitting: Cardiology

## 2020-04-10 NOTE — Telephone Encounter (Addendum)
**Note De-Identified Veronica Young Obfuscation** I started a Eliquis PA through covermymeds: Key: [id number]  Following message received from cover my meds: Veronica Young Key: [id number] Outcome  Available without authorization.  Drug Eliquis 5MG  tablets  Form [employer] Electronic PA Form  I called Rose and advised that a PA is not needed for the pts Eliquis. She thanked me for letting her know.   She wants Dr [name] to be aware that the pt has been having blurred vision and muscle cramps since her hospital d/c on 04/06/2020. She is concerned that the pts Lasix maybe causing these side effects but is unsure so she is requesting Dr Morrison Old recommendation.   She does report that the pts weight and O2's are good and stablle but that her BP fluctuates a little.  She is aware that we will contact her with Dr Morrison Old recommendations.

## 2020-04-10 NOTE — Telephone Encounter (Signed)
New Message:     Please call, she have some questions about her Hydralazine.

## 2020-04-10 NOTE — Telephone Encounter (Signed)
Have her cut the noon dose of hydralazine to 25 mg for now.

## 2020-04-10 NOTE — Telephone Encounter (Signed)
Returned call to patient's daughter Veronica Young Left message on personal voice mail Cyril Mourning advised to decrease Hydralazine to 25 mg daily for now.Advised to continue to monitor B/P.Keep appointment as planned with Almyra Deforest PA 4/29 at 11:15 am.Advised to bring B/P readings to appointment.Advised to call sooner if needed.

## 2020-04-10 NOTE — Telephone Encounter (Signed)
Message routed to Prior Authorization nurse.

## 2020-04-10 NOTE — Telephone Encounter (Signed)
Spoke to patient's daughter [name].She stated she has been giving mother Hydralazine 50 mg daily after breakfast.Stated in the mornings before taking Hydralazine B/P 119/87.Stated in the afternoons B/P in 90's.Stated she wanted to make sure she was doing the right thing.Advised I will send message to our pharmacist for advice.

## 2020-04-10 NOTE — Telephone Encounter (Signed)
New message   Per patient's daughter need prior authorization for this medicationapixaban (ELIQUIS) 5 MG TABS tablet. Please advise. Please call [employer] phone [phone].

## 2020-04-11 NOTE — Telephone Encounter (Signed)
Spoke to patient's daughter Dr.Jordan's advice given.Stated she gave mother Hydralazine 50 mg tablet this morning B/P 144/91 after Hydralazine 126/80.She will continue to monitor B/P and bring readings to appointment.

## 2020-04-11 NOTE — Telephone Encounter (Signed)
She was just DC from hospital. Had AFib which may be causing some lightheadedness. She was DC on lasix 20 mg daily. She has follow up lab work scheduled with primary care and is also scheduled to see Coletta Memos next week. I would follow through with those appointments.  Elouise Divelbiss [name] MD, [facility]

## 2020-04-17 ENCOUNTER — Ambulatory Visit (INDEPENDENT_AMBULATORY_CARE_PROVIDER_SITE_OTHER): Payer: Medicare HMO | Admitting: *Deleted

## 2020-04-17 DIAGNOSIS — I495 Sick sinus syndrome: Secondary | ICD-10-CM

## 2020-04-18 ENCOUNTER — Ambulatory Visit: Payer: Medicare HMO | Admitting: General Practice

## 2020-04-18 LAB — CUP PACEART REMOTE DEVICE CHECK
Battery Impedance: 444 Ohm
Battery Remaining Longevity: 94 mo
Battery Voltage: 2.78 V
Brady Statistic AP VP Percent: 1 %
Brady Statistic AP VS Percent: 24 %
Brady Statistic AS VP Percent: 1 %
Brady Statistic AS VS Percent: 74 %
Date Time Interrogation Session: 20210426223623
Implantable Lead Implant Date: 20150805
Implantable Lead Implant Date: 20150805
Implantable Lead Location: 753859
Implantable Lead Location: 753860
Implantable Lead Model: 5076
Implantable Lead Model: 5076
Implantable Pulse Generator Implant Date: 20150804
Lead Channel Impedance Value: 496 Ohm
Lead Channel Impedance Value: 508 Ohm
Lead Channel Pacing Threshold Amplitude: 0.625 V
Lead Channel Pacing Threshold Amplitude: 0.75 V
Lead Channel Pacing Threshold Pulse Width: 0.4 ms
Lead Channel Pacing Threshold Pulse Width: 0.4 ms
Lead Channel Setting Pacing Amplitude: 2 V
Lead Channel Setting Pacing Amplitude: 2.5 V
Lead Channel Setting Pacing Pulse Width: 0.4 ms
Lead Channel Setting Sensing Sensitivity: 5.6 mV

## 2020-04-18 NOTE — Progress Notes (Signed)
PPM Remote  

## 2020-04-20 ENCOUNTER — Ambulatory Visit (INDEPENDENT_AMBULATORY_CARE_PROVIDER_SITE_OTHER): Payer: Medicare HMO | Admitting: Physician Assistant

## 2020-04-20 ENCOUNTER — Encounter: Payer: Self-pay | Admitting: Physician Assistant

## 2020-04-20 ENCOUNTER — Other Ambulatory Visit: Payer: Self-pay

## 2020-04-20 VITALS — BP 112/78 | HR 77 | Ht 65.0 in | Wt 134.2 lb

## 2020-04-20 DIAGNOSIS — I10 Essential (primary) hypertension: Secondary | ICD-10-CM | POA: Diagnosis not present

## 2020-04-20 DIAGNOSIS — E785 Hyperlipidemia, unspecified: Secondary | ICD-10-CM

## 2020-04-20 DIAGNOSIS — E119 Type 2 diabetes mellitus without complications: Secondary | ICD-10-CM

## 2020-04-20 DIAGNOSIS — I5032 Chronic diastolic (congestive) heart failure: Secondary | ICD-10-CM

## 2020-04-20 DIAGNOSIS — I4819 Other persistent atrial fibrillation: Secondary | ICD-10-CM | POA: Diagnosis not present

## 2020-04-20 DIAGNOSIS — Z950 Presence of cardiac pacemaker: Secondary | ICD-10-CM

## 2020-04-20 DIAGNOSIS — I739 Peripheral vascular disease, unspecified: Secondary | ICD-10-CM

## 2020-04-20 NOTE — Progress Notes (Signed)
Cardiology Office Note:    Date:  04/22/2020   ID:  Veronica Young, DOB [birth year], MRN [id number]  PCP:  Katherina Mires, MD  Cardiologist:  Peter [name], MD  Electrophysiologist:  Cristopher Peru, MD PAD: Dr. Alvester Chou  Referring MD: Katherina Mires, MD   Chief Complaint  Patient presents with  . Follow-up    seen for Dr. [name]    History of Present Illness:    Veronica Young is a 86 y.o. female with a hx of HTN, HLD, DM 2, SSS s/p PPM since August 2015, PAF on Eliquis, PAD with known occluded popliteal artery and a diastolic heart failure. She is deaf and her daughter acted as Astronomer.  She was admitted in June 2018 with chest pain, this occurred in the setting of atrial tachycardia with heart rate of 150.  Thyroid level and electrolyte were normal.  Troponin was negative.  Hemoglobin was 8.6.  Echocardiogram obtained in August 2018 showed EF 65 to 70%, grade 1 DD, moderate TR and MR, PA peak pressure 51 mmHg.  I increased her hydralazine in 2018.    She has known popliteal occlusion on her previous lower extremity ultrasound and has been followed by Dr. Gwenlyn Found.  She is also being followed by Dr. Lovena Le for her device.  More recently, patient was admitted for acute on chronic diastolic heart failure.  Echocardiogram obtained on 04/03/2020 showed EF 55 to 60%, elevated RVSP 64.3 mmHg, mild MR.  She underwent diuresis and was eventually discharged on 20 mg daily of Lasix.  She was also noted to be in persistent atrial fibrillation.  She is rate controlled on metoprolol and Eliquis.  Her diuretic is being managed by her daughter who lives with her.  Patient was seen today along with a hand sign interpreter, Ms. Sherry Ruffing.  Overall she has been doing well without chest pain, shortness of breath or lower extremity edema.  Her eye doctor think some of her blurry vision may be related to her Lasix, I would be fine if she change the Lasix 20 mg to as needed at this time.  Her recent device  interrogation showed good device function and battery life.  Since the previous office visit, she has been back into atrial fibrillation, however she has no cardiac awareness of this.  Her heart rate is very well controlled on the metoprolol and she is not having any kind of bleeding issue on the Eliquis.  Recent cholesterol panel earlier this year was also good as well.  Since she is doing well, I recommended continue on medical therapy and rate control and to follow-up in 6 months.  I will contact her daughter around lunch today to give her separate report.    Past Medical History:  Diagnosis Date  . Carotid bruit   . CHF (congestive heart failure) ([place])   . Deafness   . Diabetes mellitus    type 2  . Diastolic heart failure (H[facility])    EF is 60%  . GERD (gastroesophageal reflux disease)   . Glaucoma   . Hypercholesterolemia   . Hypertension   . Hypothyroidism   . Normal cardiac stress test January 2014  . PAD (peripheral artery disease) ([place])    a. known bilateral popliteal occlusions with conservative management favored.  [name] PAT (paroxysmal atrial tachycardia) ([place])   . Sick sinus syndrome [facility])    a. s/p PPM placement 07/2014    Past Surgical History:  Procedure Laterality Date  . cholycystectomy    .  left breast cyst removed    . PACEMAKER INSERTION  07-26-2014   MDT ADDRL1 pacemaker implanted by Dr Lovena Le for SSS  . PERMANENT PACEMAKER INSERTION N/A 07/26/2014   Procedure: PERMANENT PACEMAKER INSERTION;  Surgeon: Evans Lance, MD;  Location: [facility] CATH LAB;  Service: Cardiovascular;  Laterality: N/A;  . TOTAL KNEE ARTHROPLASTY     right    Current Medications: Current Meds  Medication Sig  . apixaban (ELIQUIS) 5 MG TABS tablet Take 1 tablet (5 mg total) by mouth 2 (two) times daily.  [name] atorvastatin (LIPITOR) 80 MG tablet Take 80 mg by mouth at bedtime.   [name] atropine 1 % ophthalmic solution Place 1 drop into the left eye at bedtime.   . brimonidine (ALPHAGAN) 0.2 % ophthalmic  solution Place 1 drop into both eyes daily.  . calcium-vitamin D (OSCAL WITH D) 500-200 MG-UNIT per tablet Take 1 tablet by mouth daily with breakfast.  . cholecalciferol (VITAMIN D) 1000 UNITS tablet Take 1,000 Units by mouth daily.  . citalopram (CELEXA) 20 MG tablet Take 20 mg by mouth at bedtime.   . dorzolamide-timolol (COSOPT) 22.3-6.8 MG/ML ophthalmic solution Place 1 drop into both eyes 2 (two) times daily.   . fluorometholone (FML) 0.1 % ophthalmic suspension Place 1 drop into the left eye 4 (four) times daily.  . furosemide (LASIX) 20 MG tablet Take 1 tablet (20 mg total) by mouth daily. (Patient taking differently: Take 20 mg by mouth as needed. )  . gabapentin (NEURONTIN) 100 MG capsule Take 100 mg by mouth daily.   . hydrALAZINE (APRESOLINE) 50 MG tablet Take 1/2 tablet ( 25 mg ) daily  . latanoprost (XALATAN) 0.005 % ophthalmic solution Place 1 drop into both eyes daily.  [name] levothyroxine (SYNTHROID, LEVOTHROID) 112 MCG tablet Take 112 mcg by mouth daily before breakfast.   . metoprolol tartrate (LOPRESSOR) 25 MG tablet TAKE 1 TABLET BY MOUTH 2 TIMES DAILY. TAKE EXTRA 25MG  ( 1 TABLET) FOR PALPITATIONS AS NEEDED.  [name] pantoprazole (PROTONIX) 40 MG tablet Take 40 mg by mouth daily.  . RHOPRESSA 0.02 % SOLN Place 1 drop into the left eye at bedtime.     Allergies:   Amlodipine and Benicar [olmesartan medoxomil]   Social History   Socioeconomic History  . Marital status: Widowed    Spouse name: Not on file  . Number of children: 4  . Years of education: Not on file  . Highest education level: Not on file  Occupational History  . Not on file  Tobacco Use  . Smoking status: Never Smoker  . Smokeless tobacco: Never Used  Substance and Sexual Activity  . Alcohol use: No  . Drug use: No  . Sexual activity: Never  Other Topics Concern  . Not on file  Social History Narrative  . Not on file   Social Determinants of Health   Financial Resource Strain:   . Difficulty of  Paying Living Expenses:   Food Insecurity:   . Worried About Charity fundraiser in the Last Year:   . Arboriculturist in the Last Year:   Transportation Needs:   . Film/video editor (Medical):   [name] Lack of Transportation (Non-Medical):   Physical Activity:   . Days of Exercise per Week:   . Minutes of Exercise per Session:   Stress:   . Feeling of Stress :   Social Connections:   . Frequency of Communication with Friends and Family:   . Frequency of  Social Gatherings with Friends and Family:   . Attends Religious Services:   . Active Member of Clubs or Organizations:   . Attends Archivist Meetings:   [name] Marital Status:      Family History: The patient's family history includes Heart disease in her brother, mother, and sister; Liver cancer in her father; Stroke in her sister.  ROS:   Please see the history of present illness.     All other systems reviewed and are negative.  EKGs/Labs/Other Studies Reviewed:    The following studies were reviewed today:  Echo 04/03/2020 1. Left ventricular ejection fraction, by estimation, is 55 to 60%. The  left ventricle has normal function. The left ventricle has no regional  wall motion abnormalities. There is mild concentric left ventricular  hypertrophy. Left ventricular diastolic  parameters are indeterminate.  2. Right ventricular systolic function is normal. The right ventricular  size is normal. There is severely elevated pulmonary artery systolic  pressure. The estimated right ventricular systolic pressure is 16.1 mmHg.  3. Right atrial size was mildly dilated.  4. The mitral valve is normal in structure. Mild mitral valve  regurgitation. No evidence of mitral stenosis.  5. The aortic valve is tricuspid. Aortic valve regurgitation is trivial.  No aortic stenosis is present.  6. The inferior vena cava is dilated in size with <50% respiratory  variability, suggesting right atrial pressure of 15 mmHg.   EKG:   EKG is ordered today.  The ekg ordered today demonstrates atrial fibrillation, T wave inversion in the inferolateral leads.  Recent Labs: 04/02/2020: B Natriuretic Peptide 330.0 04/03/2020: ALT 28; TSH 5.569 04/06/2020: BUN 31; Creatinine, Ser 1.39; Hemoglobin 13.1; Magnesium 1.9; Platelets 196; Potassium 4.5; Sodium 135  Recent Lipid Panel    Component Value Date/Time   CHOL 141 04/04/2020 0354   TRIG 79 04/04/2020 0354   HDL 33 (L) 04/04/2020 0354   CHOLHDL 4.3 04/04/2020 0354   VLDL 16 04/04/2020 0354   LDLCALC 92 04/04/2020 0354    Physical Exam:    VS:  BP 112/78   Pulse 77   Ht 5\' 5"  (1.651 m)   Wt 134 lb 3.2 oz (60.9 kg)   BMI 22.33 kg/m     Wt Readings from Last 3 Encounters:  04/20/20 134 lb 3.2 oz (60.9 kg)  04/06/20 130 lb 12.8 oz (59.3 kg)  02/18/20 143 lb 12.8 oz (65.2 kg)     GEN:  Well nourished, well developed in no acute distress HEENT: Normal NECK: No JVD; No carotid bruits LYMPHATICS: No lymphadenopathy CARDIAC: Irregularly irregular, no murmurs, rubs, gallops RESPIRATORY:  Clear to auscultation without rales, wheezing or rhonchi  ABDOMEN: Soft, non-tender, non-distended MUSCULOSKELETAL:  No edema; No deformity  SKIN: Warm and dry NEUROLOGIC:  Alert and oriented x 3 PSYCHIATRIC:  Normal affect   ASSESSMENT:    1. Persistent atrial fibrillation ([place])   2. Essential hypertension   3. Hyperlipidemia LDL goal <70   4. Controlled type 2 diabetes mellitus without complication, without long-term current use of insulin ([place])   5. Pacemaker   6. PAD (peripheral artery disease) ([place])   7. Chronic diastolic CHF (congestive heart failure) (H[facility])    PLAN:    In order of problems listed above:  1. Persistent atrial fibrillation: Rate controlled on metoprolol.  Continue Eliquis.  Heart rate is very well controlled on current therapy  2. Hypertension: Blood pressure stable  3. Hyperlipidemia: On Lipitor  4. DM2: Managed by primary care  provider  5. Sick sinus syndrome s/p pacemaker: Managed by Dr. Lovena Le  6. PAD: No claudication symptoms.  7. Chronic diastolic heart failure: Recently admitted for acute on chronic diastolic heart failure and underwent diuresis.  Since discharge, she has been seen by her eye doctor who is concerned that Lasix is making her vision more blurry than usual.  On physical exam, she appears to be euvolemic.  We discussed various options, I feel it is prudent for [place] to avoid dehydration at this time.  We eventually changed Lasix to 20 mg as needed that is managed by her daughter who lives with her.   Medication Adjustments/Labs and Tests Ordered: Current medicines are reviewed at length with the patient today.  Concerns regarding medicines are outlined above.  No orders of the defined types were placed in this encounter.  No orders of the defined types were placed in this encounter.   Patient Instructions  Medication Instructions:  TAKE- Furosemide 20 mg by mouth as needed  *If you need a refill on your cardiac medications before your next appointment, please call your pharmacy*   Lab Work: None Ordered   Testing/Procedures: None Ordered   Follow-Up: At [facility], you and your health needs are our priority.  As part of our continuing mission to provide you with exceptional heart care, we have created designated Provider Care Teams.  These Care Teams include your primary Cardiologist (physician) and Advanced Practice Providers (APPs -  Physician Assistants and Nurse Practitioners) who all work together to provide you with the care you need, when you need it.  We recommend signing up for the patient portal called "MyChart".  Sign up information is provided on this After Visit Summary.  MyChart is used to connect with patients for Virtual Visits (Telemedicine).  Patients are able to view lab/test results, encounter notes, upcoming appointments, etc.  Non-urgent messages can be sent to  your provider as well.   To learn more about what you can do with MyChart, go to [email or web address].    Your next appointment:   6 month(s)  The format for your next appointment:   In Person  Provider:   You may see Peter [name], MD or one of the following Advanced Practice Providers on your designated Care Team:    Almyra Deforest, PA-C  Fabian Sharp, PA-C or   Roby Lofts, PA-C         Signed, [place], [place]  04/22/2020 11:05 PM    [place]

## 2020-04-20 NOTE — Patient Instructions (Signed)
Medication Instructions:  TAKE- Furosemide 20 mg by mouth as needed  *If you need a refill on your cardiac medications before your next appointment, please call your pharmacy*   Lab Work: None Ordered   Testing/Procedures: None Ordered   Follow-Up: At [facility], you and your health needs are our priority.  As part of our continuing mission to provide you with exceptional heart care, we have created designated Provider Care Teams.  These Care Teams include your primary Cardiologist (physician) and Advanced Practice Providers (APPs -  Physician Assistants and Nurse Practitioners) who all work together to provide you with the care you need, when you need it.  We recommend signing up for the patient portal called "MyChart".  Sign up information is provided on this After Visit Summary.  MyChart is used to connect with patients for Virtual Visits (Telemedicine).  Patients are able to view lab/test results, encounter notes, upcoming appointments, etc.  Non-urgent messages can be sent to your provider as well.   To learn more about what you can do with MyChart, go to [email or web address].    Your next appointment:   6 month(s)  The format for your next appointment:   In Person  Provider:   You may see Peter [name], MD or one of the following Advanced Practice Providers on your designated Care Team:    Almyra Deforest, PA-C  Fabian Sharp, PA-C or   Roby Lofts, [place]

## 2020-04-22 ENCOUNTER — Encounter: Payer: Self-pay | Admitting: Physician Assistant

## 2020-04-24 ENCOUNTER — Telehealth: Payer: Self-pay | Admitting: *Deleted

## 2020-04-24 ENCOUNTER — Telehealth: Payer: Self-pay | Admitting: Cardiology

## 2020-04-24 NOTE — Telephone Encounter (Signed)
Patient's daughter states that there mothers last OV with Almyra Deforest on 04/20/20 he told her that she did not need to be on oxygen anymore. When she went to call the company to pick up the machine, they stated that the doctor needed to fax over a statement stating that the oxygen tank was no longer needed so they can do a pick up order. Fax number: [phone]

## 2020-04-24 NOTE — Telephone Encounter (Signed)
Returned call to daughter (ok per DPR)-she states they were informed that patient no longer needed O2 at last OV with Almyra Deforest PA.  [facility] called [facility] to schedule pick up and they need an order to discontinue oxygen prior to picking it up.  Routed to PA to review and advise

## 2020-04-24 NOTE — Telephone Encounter (Signed)
Patient assistance form for eliquis faxed.

## 2020-04-26 NOTE — Telephone Encounter (Signed)
The daughter has been called. She stated that they are paying for the oxygen daily. They were told that the patient could stop the oxygen at the last office visit. She needs the discharge note faxed to [phone]

## 2020-04-26 NOTE — Telephone Encounter (Signed)
Follow up   Patient's daughter states that the oxygen has not been picked up and she states that the patient is being charged a daily fee for this service. Please call to discuss.

## 2020-04-26 NOTE — Telephone Encounter (Signed)
As long as she can walk around with O2 Saturday > 88%, ok to stop the home O2

## 2020-04-28 ENCOUNTER — Telehealth: Payer: Self-pay | Admitting: Cardiology

## 2020-04-28 NOTE — Telephone Encounter (Signed)
Sent to primary nurse to assist with discontinue order request

## 2020-04-28 NOTE — Telephone Encounter (Signed)
Veronica Young, Daughter of the patient called. The patient was sent home from the hospital with an oxygen concentrator that she no longer needs. Isaac Laud was OK with the patient stopping the treatment. Veronica Young contacted [place] that issued the oxygen concentrator and tried to discontinue the service herself, but the company needs Dr. [name] to fax orders to d/c usage so the patient does not get charged anymore.  Veronica Young said she reached out and someone was supposed to send orders via fax, so if they did do that she would just like to follow up. If no one has faxed those orders yet, the orders can be faxed to [place] at  [phone]

## 2020-04-28 NOTE — Telephone Encounter (Signed)
Returned call to patient's daughter [name] left message on personal voice mail Dr.Jordan out of office today.Advised I will ask him on Mon 5/10 about stopping O2.

## 2020-05-01 NOTE — Telephone Encounter (Signed)
Spoke to [place] he advised ok to stop O2.Faxed note to [place] at fax # [phone].

## 2020-05-02 ENCOUNTER — Telehealth: Payer: Self-pay | Admitting: Cardiology

## 2020-05-02 NOTE — Telephone Encounter (Signed)
See previous phone note from 04/28/20.Marland KitchenPatient's daughter states that [facility] has not received the fax. I confirmed the fax number as [phone]. She asked if Malachy Mood could please try to refax it again.

## 2020-05-02 NOTE — Telephone Encounter (Signed)
Spoke to patient's daughter [name] note to stop O2 refaxed to [place] at fax # [phone].

## 2020-05-04 NOTE — Telephone Encounter (Signed)
Spoke with Jeneen Rinks with [employer] and refaxed order again to [phone] to stop pt home O2.   I advised him that I will call him back to be sure it has been received. [phone].

## 2020-05-04 NOTE — Telephone Encounter (Addendum)
I faxed note re: d/c of pt home O2 and received confirmation on the fax that it went through.   I called back and had to LM for [employer] to please call us back asap to confirm they received the fax as they should have.   [phone]  Spoke with [name] and she confirmed that [employer] did call her they have received my fax and will pick up the O2 by tomorrow.

## 2020-05-04 NOTE — Telephone Encounter (Signed)
Follow up  Pt's daughter [name], calling back she said she spoke with [facility] again and she said they still haven't receive the or to stop pt's O2. She said fax# [phone] is a correct fax#

## 2020-05-24 ENCOUNTER — Other Ambulatory Visit: Payer: Self-pay

## 2020-05-24 ENCOUNTER — Ambulatory Visit (INDEPENDENT_AMBULATORY_CARE_PROVIDER_SITE_OTHER): Payer: Medicare HMO | Admitting: Cardiology

## 2020-05-24 VITALS — BP 140/80 | HR 81 | Temp 96.6°F | Ht 65.0 in | Wt 136.4 lb

## 2020-05-24 DIAGNOSIS — I739 Peripheral vascular disease, unspecified: Secondary | ICD-10-CM

## 2020-05-24 DIAGNOSIS — H9190 Unspecified hearing loss, unspecified ear: Secondary | ICD-10-CM

## 2020-05-24 DIAGNOSIS — N183 Chronic kidney disease, stage 3 unspecified: Secondary | ICD-10-CM

## 2020-05-24 DIAGNOSIS — Z950 Presence of cardiac pacemaker: Secondary | ICD-10-CM | POA: Diagnosis not present

## 2020-05-24 DIAGNOSIS — N189 Chronic kidney disease, unspecified: Secondary | ICD-10-CM | POA: Insufficient documentation

## 2020-05-24 DIAGNOSIS — Z7901 Long term (current) use of anticoagulants: Secondary | ICD-10-CM | POA: Insufficient documentation

## 2020-05-24 DIAGNOSIS — I5033 Acute on chronic diastolic (congestive) heart failure: Secondary | ICD-10-CM

## 2020-05-24 DIAGNOSIS — I10 Essential (primary) hypertension: Secondary | ICD-10-CM

## 2020-05-24 DIAGNOSIS — I480 Paroxysmal atrial fibrillation: Secondary | ICD-10-CM | POA: Diagnosis not present

## 2020-05-24 MED ORDER — FUROSEMIDE 20 MG PO TABS: 20.0000 mg | ORAL_TABLET | Freq: Every day | ORAL | 1 refills | Status: DC

## 2020-05-24 NOTE — Assessment & Plan Note (Signed)
The patient does not want to take Lasix though I have suggested she lasix 40 mg QD x 2 then 20 mg daily.

## 2020-05-24 NOTE — Assessment & Plan Note (Signed)
GFR =35

## 2020-05-24 NOTE — Assessment & Plan Note (Signed)
She is noted to be in AF- I suspect this may be contributing to her fatigue and DOE.  I suggested she take her metoprolol BID- I believe she only takes it PRN now

## 2020-05-24 NOTE — Assessment & Plan Note (Signed)
MDT placed Aug 2015- Dr Lovena Le follows

## 2020-05-24 NOTE — Assessment & Plan Note (Signed)
Eliquis-her dose will need to be decreased if her weight goes below 60kg- she is 62 kg today

## 2020-05-24 NOTE — Assessment & Plan Note (Signed)
Controlled.  

## 2020-05-24 NOTE — Progress Notes (Signed)
Cardiology Office Note:    Date:  05/24/2020   ID:  BREA Young, DOB 1933-12-15, MRN [id number]  PCP:  Katherina Mires, MD  Cardiologist:  Peter [name], MD  Electrophysiologist:  Cristopher Peru, MD   Referring MD: Katherina Mires, MD   CC: edema and DOE  History of Present Illness:    Veronica Young is a 86 y.o. female with a hx of PAF with sick sinus syndrome, status post Medtronic pacemaker in 2015.  She is on Eliquis, her weight is 62 kg today.  Other problems include vascular disease with known bilateral popliteal artery occlusion, Dr. Alvester Chou is recommended no further work-up and did not feel she was an interventional candidate for this.  The patient is deaf.  She was seen today with the assistance of an interpreter.  She has a history of diastolic heart failure, she was admitted in April 2021.  She was just seen in follow-up 04/20/2020 and felt to be doing well on her current medications.  The patient apparently requested this visit for dyspnea.  She complains of fatigue and dyspnea on exertion.  She has some chronic lower extremity edema, she feels this may be a little bit worse than usual.  I reviewed her medications with the assistance of our interpreter.  I admit this was a difficult process, it was hard to get a clear answer of what she is taking.  The records indicate she takes metoprolol as needed for palpitations.  She has recently been noted to be back in atrial fibrillation.  She was in atrial fibrillation on exam.  She takes furosemide on a as needed basis.  She does not like to take it because she thinks it makes her gain weight.  Past Medical History:  Diagnosis Date  . Carotid bruit   . CHF (congestive heart failure) ([place])   . Deafness   . Diabetes mellitus    type 2  . Diastolic heart failure (HCC)    EF is 60%  . GERD (gastroesophageal reflux disease)   . Glaucoma   . Hypercholesterolemia   . Hypertension   . Hypothyroidism   . Normal cardiac stress test January  2014  . PAD (peripheral artery disease) ([place])    a. known bilateral popliteal occlusions with conservative management favored.  [name] PAT (paroxysmal atrial tachycardia) ([place])   . Sick sinus syndrome [facility])    a. s/p PPM placement 07/2014    Past Surgical History:  Procedure Laterality Date  . cholycystectomy    . left breast cyst removed    . PACEMAKER INSERTION  07-26-2014   MDT ADDRL1 pacemaker implanted by Dr Lovena Le for SSS  . PERMANENT PACEMAKER INSERTION N/A 07/26/2014   Procedure: PERMANENT PACEMAKER INSERTION;  Surgeon: Evans Lance, MD;  Location: [facility] CATH LAB;  Service: Cardiovascular;  Laterality: N/A;  . TOTAL KNEE ARTHROPLASTY     right    Current Medications: Current Meds  Medication Sig  . apixaban (ELIQUIS) 5 MG TABS tablet Take 1 tablet (5 mg total) by mouth 2 (two) times daily.  [name] atorvastatin (LIPITOR) 80 MG tablet Take 80 mg by mouth at bedtime.   [name] atropine 1 % ophthalmic solution Place 1 drop into the left eye at bedtime.   . brimonidine (ALPHAGAN) 0.2 % ophthalmic solution Place 1 drop into both eyes daily.  . calcium-vitamin D (OSCAL WITH D) 500-200 MG-UNIT per tablet Take 1 tablet by mouth daily with breakfast.  . cholecalciferol (VITAMIN D) 1000 UNITS  tablet Take 1,000 Units by mouth daily.  . citalopram (CELEXA) 20 MG tablet Take 20 mg by mouth at bedtime.   . dorzolamide-timolol (COSOPT) 22.3-6.8 MG/ML ophthalmic solution Place 1 drop into both eyes 2 (two) times daily.   . fluorometholone (FML) 0.1 % ophthalmic suspension Place 1 drop into the left eye 4 (four) times daily.  . furosemide (LASIX) 20 MG tablet Take 1 tablet (20 mg total) by mouth daily.  [name] gabapentin (NEURONTIN) 100 MG capsule Take 100 mg by mouth daily.   . hydrALAZINE (APRESOLINE) 50 MG tablet Take 1/2 tablet ( 25 mg ) daily  . latanoprost (XALATAN) 0.005 % ophthalmic solution Place 1 drop into both eyes daily.  [name] levothyroxine (SYNTHROID, LEVOTHROID) 112 MCG tablet Take 112 mcg by mouth daily  before breakfast.   . metoprolol tartrate (LOPRESSOR) 25 MG tablet TAKE 1 TABLET BY MOUTH 2 TIMES DAILY. TAKE EXTRA 25MG  ( 1 TABLET) FOR PALPITATIONS AS NEEDED.  [name] pantoprazole (PROTONIX) 40 MG tablet Take 40 mg by mouth daily.  . RHOPRESSA 0.02 % SOLN Place 1 drop into the left eye at bedtime.  . [DISCONTINUED] furosemide (LASIX) 20 MG tablet Take 1 tablet (20 mg total) by mouth daily. (Patient taking differently: Take 20 mg by mouth as needed. )     Allergies:   Amlodipine and Benicar [olmesartan medoxomil]   Social History   Socioeconomic History  . Marital status: Widowed    Spouse name: Not on file  . Number of children: 4  . Years of education: Not on file  . Highest education level: Not on file  Occupational History  . Not on file  Tobacco Use  . Smoking status: Never Smoker  . Smokeless tobacco: Never Used  Substance and Sexual Activity  . Alcohol use: No  . Drug use: No  . Sexual activity: Never  Other Topics Concern  . Not on file  Social History Narrative  . Not on file   Social Determinants of Health   Financial Resource Strain:   . Difficulty of Paying Living Expenses:   Food Insecurity:   . Worried About Charity fundraiser in the Last Year:   . Arboriculturist in the Last Year:   Transportation Needs:   . Film/video editor (Medical):   [name] Lack of Transportation (Non-Medical):   Physical Activity:   . Days of Exercise per Week:   . Minutes of Exercise per Session:   Stress:   . Feeling of Stress :   Social Connections:   . Frequency of Communication with Friends and Family:   . Frequency of Social Gatherings with Friends and Family:   . Attends Religious Services:   . Active Member of Clubs or Organizations:   . Attends Archivist Meetings:   [name] Marital Status:      Family History: The patient's family history includes Heart disease in her brother, mother, and sister; Liver cancer in her father; Stroke in her sister.  ROS:   Please  see the history of present illness.     All other systems reviewed and are negative.  EKGs/Labs/Other Studies Reviewed:    The following studies were reviewed today: Echo 04/03/2020- IMPRESSIONS    1. Left ventricular ejection fraction, by estimation, is 55 to 60%. The  left ventricle has normal function. The left ventricle has no regional  wall motion abnormalities. There is mild concentric left ventricular  hypertrophy. Left ventricular diastolic  parameters are indeterminate.  2.  Right ventricular systolic function is normal. The right ventricular  size is normal. There is severely elevated pulmonary artery systolic  pressure. The estimated right ventricular systolic pressure is 57.3 mmHg.  3. Right atrial size was mildly dilated.  4. The mitral valve is normal in structure. Mild mitral valve  regurgitation. No evidence of mitral stenosis.  5. The aortic valve is tricuspid. Aortic valve regurgitation is trivial.  No aortic stenosis is present.  6. The inferior vena cava is dilated in size with <50% respiratory  variability, suggesting right atrial pressure of 15 mmHg.   EKG:  EKG is ordered today.  The ekg ordered today demonstrates AF-POD  Recent Labs: 04/02/2020: B Natriuretic Peptide 330.0 04/03/2020: ALT 28; TSH 5.569 04/06/2020: BUN 31; Creatinine, Ser 1.39; Hemoglobin 13.1; Magnesium 1.9; Platelets 196; Potassium 4.5; Sodium 135  Recent Lipid Panel    Component Value Date/Time   CHOL 141 04/04/2020 0354   TRIG 79 04/04/2020 0354   HDL 33 (L) 04/04/2020 0354   CHOLHDL 4.3 04/04/2020 0354   VLDL 16 04/04/2020 0354   LDLCALC 92 04/04/2020 0354    Physical Exam:    VS:  BP 140/80   Pulse 81   Temp (!) 96.6 F (35.9 C)   Ht 5\' 5"  (1.651 m)   Wt 136 lb 6.4 oz (61.9 kg)   SpO2 92%   BMI 22.70 kg/m     Wt Readings from Last 3 Encounters:  05/24/20 136 lb 6.4 oz (61.9 kg)  04/20/20 134 lb 3.2 oz (60.9 kg)  04/06/20 130 lb 12.8 oz (59.3 kg)     GEN:  Well  nourished, well developed in no acute distress HEENT: Normal NECK: No JVD; No carotid bruits LYMPHATICS: No lymphadenopathy CARDIAC: irregularly irregular rhythm, no murmurs, rubs, gallops RESPIRATORY:  Clear to auscultation without rales, wheezing or rhonchi  ABDOMEN: Soft, non-tender, non-distended MUSCULOSKELETAL: Trace LE  edema; No deformity  SKIN: Warm and dry NEUROLOGIC:  Alert and oriented x 3 PSYCHIATRIC:  Normal affect   ASSESSMENT:    Acute on chronic diastolic CHF (congestive heart failure) (HCC) The patient does not want to take Lasix though I have suggested she lasix 40 mg QD x 2 then 20 mg daily.  PAF (paroxysmal atrial fibrillation) ([place]) She is noted to be in AF- I suspect this may be contributing to her fatigue and DOE.  I suggested she take her metoprolol BID- I believe she only takes it PRN now  PAD (peripheral artery disease) ([place]) Bilateral popliteal artery occlusion by [place] Jan 2020- not a candidate for intervention per Dr Gwenlyn Found- prn f/u.   Chronic anticoagulation Eliquis-her dose will need to be decreased if her weight goes below 60kg- she is 62 kg today  HTN (hypertension) Controlled  Cardiac pacemaker in situ MDT placed Aug 2015- Dr Lovena Le follows  CRI (chronic renal insufficiency), stage 3 (moderate) GFR 35  Deafness Seen with an interpreter   PLAN:    Increase Lasix to 40 mg QD x 2-then take 20 mg daily.  Take Metoprolol 25 mg BID.  F/U 4 weeks.    Medication Adjustments/Labs and Tests Ordered: Current medicines are reviewed at length with the patient today.  Concerns regarding medicines are outlined above.  Orders Placed This Encounter  Procedures  . EKG 12-Lead   Meds ordered this encounter  Medications  . furosemide (LASIX) 20 MG tablet    Sig: Take 1 tablet (20 mg total) by mouth daily.    Dispense:  30 tablet  Refill:  1    Patient Instructions  Medication Instructions:   change Lasix  (furosemide )  40 mg  ( 2 tablets)  For  2 days   Then decrease to 20 mg daily  Change to taking  Metoprolol tartrate 25 mg twice a day  *If you need a refill on your cardiac medications before your next appointment, please call your pharmacy*   Lab Work: Not needed  Testing/Procedures: Not needed   Follow-Up: At [facility], you and your health needs are our priority.  As part of our continuing mission to provide you with exceptional heart care, we have created designated Provider Care Teams.  These Care Teams include your primary Cardiologist (physician) and Advanced Practice Providers (APPs -  Physician Assistants and Nurse Practitioners) who all work together to provide you with the care you need, when you need it.  We recommend signing up for the patient portal called "MyChart".  Sign up information is provided on this After Visit Summary.  MyChart is used to connect with patients for Virtual Visits (Telemedicine).  Patients are able to view lab/test results, encounter notes, upcoming appointments, etc.  Non-urgent messages can be sent to your provider as well.   To learn more about what you can do with MyChart, go to [email or web address].    Your next appointment:   4 week(s)  The format for your next appointment:   In Person  Provider:   Kerin Ransom, PA-C       Signed, Kerin Ransom, PA-C  05/24/2020 10:57 AM    [place]

## 2020-05-24 NOTE — Patient Instructions (Addendum)
Medication Instructions:   change Lasix  (furosemide )  40 mg  ( 2 tablets)  For 2 days   Then decrease to 20 mg daily  Change to taking  Metoprolol tartrate 25 mg twice a day  *If you need a refill on your cardiac medications before your next appointment, please call your pharmacy*   Lab Work: Not needed  Testing/Procedures: Not needed   Follow-Up: At [facility], you and your health needs are our priority.  As part of our continuing mission to provide you with exceptional heart care, we have created designated Provider Care Teams.  These Care Teams include your primary Cardiologist (physician) and Advanced Practice Providers (APPs -  Physician Assistants and Nurse Practitioners) who all work together to provide you with the care you need, when you need it.  We recommend signing up for the patient portal called "MyChart".  Sign up information is provided on this After Visit Summary.  MyChart is used to connect with patients for Virtual Visits (Telemedicine).  Patients are able to view lab/test results, encounter notes, upcoming appointments, etc.  Non-urgent messages can be sent to your provider as well.   To learn more about what you can do with MyChart, go to [email or web address].    Your next appointment:   4 week(s)  The format for your next appointment:   In Person  Provider:   Kerin Ransom, PA-C

## 2020-05-24 NOTE — Assessment & Plan Note (Signed)
Seen with an interpreter

## 2020-05-24 NOTE — Assessment & Plan Note (Signed)
Bilateral popliteal artery occlusion by [place] Jan 2020- not a candidate for intervention per Dr Gwenlyn Found- prn f/u.

## 2020-06-01 ENCOUNTER — Telehealth: Payer: Self-pay | Admitting: Emergency Medicine

## 2020-06-01 NOTE — Telephone Encounter (Signed)
Pt c/o Shortness Of Breath: STAT if SOB developed within the last 24 hours or pt is noticeably SOB on the phone  1. Are you currently SOB (can you hear that pt is SOB on the phone)? No  2. How long have you been experiencing SOB? A couple of weeks  3. Are you SOB when sitting or when up moving around? On exertion, daughter states its on and off. When at rest, patient is fine but when moving, pt gets sob.   4. Are you currently experiencing any other symptoms? No

## 2020-06-01 NOTE — Telephone Encounter (Signed)
Spoke with patient's daughter Veronica Young. Patient continues to complain of shortness of breath. Daughter states she doesn't think it's bad it seems to be more like patient is nervous.   Patient complains of the shortness of breath with activities such as sweeping, making the bed and walking outside. Pulse Ox reads 90-03% on room air. Heart rate is steady at 83-85 per daughter. No complaints of palpitations or chest pain.  No swelling noted. Weight is down to 133.5lbs, was 136lbs in office on 6/2.   Patient has been taking lasix 20mg  daily and has been taking metoprolol 25mg  BID.   Advised if patient develops shortness of breath at rest or when lying down or if shortness of breath worsens to report to the emergency room for evaluation. In the meantime continue current medications and monitor weights daily. Call back if patient gains 3lbs overnight or 5lbs in a week. Allow for rest breaks during activity and pace herself.   Will route to MD for review.

## 2020-06-02 NOTE — Telephone Encounter (Signed)
Agree 

## 2020-06-13 ENCOUNTER — Other Ambulatory Visit: Payer: Self-pay | Admitting: Cardiology

## 2020-06-20 ENCOUNTER — Ambulatory Visit: Payer: Medicare HMO | Admitting: Cardiology

## 2020-06-20 ENCOUNTER — Other Ambulatory Visit: Payer: Self-pay

## 2020-06-20 VITALS — BP 134/88 | HR 90 | Ht 65.0 in | Wt 130.8 lb

## 2020-06-20 DIAGNOSIS — Z950 Presence of cardiac pacemaker: Secondary | ICD-10-CM

## 2020-06-20 DIAGNOSIS — I10 Essential (primary) hypertension: Secondary | ICD-10-CM

## 2020-06-20 DIAGNOSIS — Z7901 Long term (current) use of anticoagulants: Secondary | ICD-10-CM

## 2020-06-20 DIAGNOSIS — I480 Paroxysmal atrial fibrillation: Secondary | ICD-10-CM | POA: Diagnosis not present

## 2020-06-20 DIAGNOSIS — I739 Peripheral vascular disease, unspecified: Secondary | ICD-10-CM

## 2020-06-20 DIAGNOSIS — N183 Chronic kidney disease, stage 3 unspecified: Secondary | ICD-10-CM

## 2020-06-20 DIAGNOSIS — I5033 Acute on chronic diastolic (congestive) heart failure: Secondary | ICD-10-CM | POA: Diagnosis not present

## 2020-06-20 DIAGNOSIS — H9190 Unspecified hearing loss, unspecified ear: Secondary | ICD-10-CM

## 2020-06-20 LAB — BASIC METABOLIC PANEL
BUN/Creatinine Ratio: 18 (ref 12–28)
BUN: 23 mg/dL (ref 8–27)
CO2: 24 mmol/L (ref 20–29)
Calcium: 9.6 mg/dL (ref 8.7–10.3)
Chloride: 97 mmol/L (ref 96–106)
Creatinine, Ser: 1.26 mg/dL — ABNORMAL HIGH (ref 0.57–1.00)
GFR calc Af Amer: 45 mL/min/{1.73_m2} — ABNORMAL LOW (ref >59)
GFR calc non Af Amer: 39 mL/min/{1.73_m2} — ABNORMAL LOW (ref >59)
Glucose: 86 mg/dL (ref 65–99)
Potassium: 4.3 mmol/L (ref 3.5–5.2)
Sodium: 134 mmol/L (ref 134–144)

## 2020-06-20 NOTE — Progress Notes (Signed)
Cardiology Office Note:    Date:  06/20/2020   ID:  GABRIELLE WAKELAND, DOB 10-Jun-1933, MRN [id number]  PCP:  Katherina Mires, MD  Cardiologist:  Peter [name], MD  Electrophysiologist:  Cristopher Peru, MD   Referring MD: Katherina Mires, MD   No chief complaint on file.   History of Present Illness:    Veronica Young is a 86 y.o. female with a hx of PAF with sick sinus syndrome, status post Medtronic pacemaker in 2015.  She is on Eliquis.  Other problems include vascular disease with known bilateral popliteal artery occlusion, Dr. Gwenlyn Found has recommended no further work-up and did not feel she was an interventional candidate for this.  The patient is deaf.  She was seen today with the assistance of an interpreter.  She has a history of diastolic heart failure, she was admitted for this in April 2021.  She was seen in the office 05/24/2020 with complaints of SOB and fatigue. At that time it became clear that the patient was not taking her Lasix as prescribed. She was concerned it would make her "gain weight".  Through the interpretor I explained the reasoning behind diuretic Rx in her case and suggested she take 40 mg for a few days then 20 mg daily.  She returns today for follow up.   The patient says she is better since the adjustment in her lasix. She is taking it daily in the afternoon and I suggested she take it in the morning.  I'll check a BMP today (she has CRI-3).  She wanted to know if it would be OK for her to fly to [place] for her grandson's wedding and I told her this should be OK as long as her labs looked OK.   Past Medical History:  Diagnosis Date  . Carotid bruit   . CHF (congestive heart failure) ([facility])   . Deafness   . Diabetes mellitus    type 2  . Diastolic heart failure (HCC)    EF is 60%  . GERD (gastroesophageal reflux disease)   . Glaucoma   . Hypercholesterolemia   . Hypertension   . Hypothyroidism   . Normal cardiac stress test January 2014  . PAD (peripheral artery  disease) ([place])    a. known bilateral popliteal occlusions with conservative management favored.  [name] PAT (paroxysmal atrial tachycardia) ([place])   . Sick sinus syndrome [facility])    a. s/p PPM placement 07/2014    Past Surgical History:  Procedure Laterality Date  . cholycystectomy    . left breast cyst removed    . PACEMAKER INSERTION  07-26-2014   MDT ADDRL1 pacemaker implanted by Dr Lovena Le for SSS  . PERMANENT PACEMAKER INSERTION N/A 07/26/2014   Procedure: PERMANENT PACEMAKER INSERTION;  Surgeon: Evans Lance, MD;  Location: [facility] CATH LAB;  Service: Cardiovascular;  Laterality: N/A;  . TOTAL KNEE ARTHROPLASTY     right    Current Medications: Current Meds  Medication Sig  . apixaban (ELIQUIS) 5 MG TABS tablet Take 1 tablet (5 mg total) by mouth 2 (two) times daily.  [name] atorvastatin (LIPITOR) 80 MG tablet Take 80 mg by mouth at bedtime.   [name] atropine 1 % ophthalmic solution Place 1 drop into the left eye at bedtime.   . brimonidine (ALPHAGAN) 0.2 % ophthalmic solution Place 1 drop into both eyes daily.  . calcium-vitamin D (OSCAL WITH D) 500-200 MG-UNIT per tablet Take 1 tablet by mouth daily with breakfast.  .  cholecalciferol (VITAMIN D) 1000 UNITS tablet Take 1,000 Units by mouth daily.  . citalopram (CELEXA) 20 MG tablet Take 20 mg by mouth at bedtime.   . dorzolamide-timolol (COSOPT) 22.3-6.8 MG/ML ophthalmic solution Place 1 drop into both eyes 2 (two) times daily.   . fluorometholone (FML) 0.1 % ophthalmic suspension Place 1 drop into the left eye 4 (four) times daily.  . furosemide (LASIX) 20 MG tablet Take 1 tablet (20 mg total) by mouth daily.  [name] gabapentin (NEURONTIN) 100 MG capsule Take 100 mg by mouth daily.   . hydrALAZINE (APRESOLINE) 50 MG tablet Take 1/2 tablet ( 25 mg ) daily  . latanoprost (XALATAN) 0.005 % ophthalmic solution Place 1 drop into both eyes daily.  [name] levothyroxine (SYNTHROID, LEVOTHROID) 112 MCG tablet Take 112 mcg by mouth daily before breakfast.   .  metoprolol tartrate (LOPRESSOR) 25 MG tablet Take 1 tablet (25 mg total) by mouth 2 (two) times daily.  . pantoprazole (PROTONIX) 40 MG tablet Take 40 mg by mouth daily.  . RHOPRESSA 0.02 % SOLN Place 1 drop into the left eye at bedtime.     Allergies:   Amlodipine and Benicar [olmesartan medoxomil]   Social History   Socioeconomic History  . Marital status: Widowed    Spouse name: Not on file  . Number of children: 4  . Years of education: Not on file  . Highest education level: Not on file  Occupational History  . Not on file  Tobacco Use  . Smoking status: Never Smoker  . Smokeless tobacco: Never Used  Substance and Sexual Activity  . Alcohol use: No  . Drug use: No  . Sexual activity: Never  Other Topics Concern  . Not on file  Social History Narrative  . Not on file   Social Determinants of Health   Financial Resource Strain:   . Difficulty of Paying Living Expenses:   Food Insecurity:   . Worried About Charity fundraiser in the Last Year:   . Arboriculturist in the Last Year:   Transportation Needs:   . Film/video editor (Medical):   [name] Lack of Transportation (Non-Medical):   Physical Activity:   . Days of Exercise per Week:   . Minutes of Exercise per Session:   Stress:   . Feeling of Stress :   Social Connections:   . Frequency of Communication with Friends and Family:   . Frequency of Social Gatherings with Friends and Family:   . Attends Religious Services:   . Active Member of Clubs or Organizations:   . Attends Archivist Meetings:   [name] Marital Status:      Family History: The patient's family history includes Heart disease in her brother, mother, and sister; Liver cancer in her father; Stroke in her sister.  ROS:   Please see the history of present illness.     All other systems reviewed and are negative.  EKGs/Labs/Other Studies Reviewed:    The following studies were reviewed today: Echo 04/03/2020- IMPRESSIONS    1. Left  ventricular ejection fraction, by estimation, is 55 to 60%. The  left ventricle has normal function. The left ventricle has no regional  wall motion abnormalities. There is mild concentric left ventricular  hypertrophy. Left ventricular diastolic  parameters are indeterminate.  2. Right ventricular systolic function is normal. The right ventricular  size is normal. There is severely elevated pulmonary artery systolic  pressure. The estimated right ventricular systolic pressure is  64.3 mmHg.  3. Right atrial size was mildly dilated.  4. The mitral valve is normal in structure. Mild mitral valve  regurgitation. No evidence of mitral stenosis.  5. The aortic valve is tricuspid. Aortic valve regurgitation is trivial.  No aortic stenosis is present.  6. The inferior vena cava is dilated in size with <50% respiratory  variability, suggesting right atrial pressure of 15 mmHg.   EKG:  EKG is ordered today.  The ekg ordered today demonstrates AF with VR 81, septal Qs, inferior TWI  Recent Labs: 04/02/2020: B Natriuretic Peptide 330.0 04/03/2020: ALT 28; TSH 5.569 04/06/2020: BUN 31; Creatinine, Ser 1.39; Hemoglobin 13.1; Magnesium 1.9; Platelets 196; Potassium 4.5; Sodium 135  Recent Lipid Panel    Component Value Date/Time   CHOL 141 04/04/2020 0354   TRIG 79 04/04/2020 0354   HDL 33 (L) 04/04/2020 0354   CHOLHDL 4.3 04/04/2020 0354   VLDL 16 04/04/2020 0354   LDLCALC 92 04/04/2020 0354    Physical Exam:    VS:  BP 134/88   Pulse 90   Ht 5\' 5"  (1.651 m)   Wt 130 lb 12.8 oz (59.3 kg)   SpO2 94%   BMI 21.77 kg/m     Wt Readings from Last 3 Encounters:  06/20/20 130 lb 12.8 oz (59.3 kg)  05/24/20 136 lb 6.4 oz (61.9 kg)  04/20/20 134 lb 3.2 oz (60.9 kg)     GEN: Elderly Caucasian female,  well developed in no acute distress HEENT: Normal NECK: No JVD; No carotid bruits CARDIAC: irregularly irregular, no murmurs, rubs, gallops RESPIRATORY: few crackles Lt base ABDOMEN:  Soft, non-tender, non-distended MUSCULOSKELETAL:  Trace edema; varicosities, No deformity  SKIN: Warm and dry NEUROLOGIC:  Alert and oriented x 3 PSYCHIATRIC:  Normal affect   ASSESSMENT:    Acute on chronic diastolic CHF (congestive heart failure) (HCC) The patient does appear to be improved after resuming Lasix as directed. Weight today 60 kg.  PAF (paroxysmal atrial fibrillation) ([place]) She is noted to be in AF- I suspect this may be contributing to her fatigue and DOE.  I suggested she take her metoprolol BID- I believe she only takes it PRN now  PAD (peripheral artery disease) ([place]) Bilateral popliteal artery occlusion by [place] Jan 2020- not a candidate for intervention per Dr Gwenlyn Found- prn f/u.   Chronic anticoagulation Eliquis-her dose will need to be decreased if her weight goes below 60kg- she is 60 kg today  HTN (hypertension) Controlled  Cardiac pacemaker in situ MDT placed Aug 2015- Dr Lovena Le follows  CRI (chronic renal insufficiency), stage 3 (moderate) GFR 35  Deafness Seen with an interpreter   PLAN:    Check labs- she may need her Eliquis reduced soon, her weight is right at 60 kg. If her SCr is 1.5 I will suggest we cut this back to 2.5 mg BID.  F/U 6 months.    Medication Adjustments/Labs and Tests Ordered: Current medicines are reviewed at length with the patient today.  Concerns regarding medicines are outlined above.  No orders of the defined types were placed in this encounter.  No orders of the defined types were placed in this encounter.   There are no Patient Instructions on file for this visit.   Angelena Form, PA-C  06/20/2020 10:57 AM    [place]

## 2020-06-20 NOTE — Patient Instructions (Signed)
Medication Instructions:   OK to take 1 extra Lasix tablet for 2 days if you are swelling/feeling short of breath.  *If you need a refill on your cardiac medications before your next appointment, please call your pharmacy*   Follow-Up: At [facility], you and your health needs are our priority.  As part of our continuing mission to provide you with exceptional heart care, we have created designated Provider Care Teams.  These Care Teams include your primary Cardiologist (physician) and Advanced Practice Providers (APPs -  Physician Assistants and Nurse Practitioners) who all work together to provide you with the care you need, when you need it.  We recommend signing up for the patient portal called "MyChart".  Sign up information is provided on this After Visit Summary.  MyChart is used to connect with patients for Virtual Visits (Telemedicine).  Patients are able to view lab/test results, encounter notes, upcoming appointments, etc.  Non-urgent messages can be sent to your provider as well.   To learn more about what you can do with MyChart, go to [email or web address].    Your next appointment:   6 month(s)  The format for your next appointment:   In Person  Provider:   You may see Peter [name], MD or one of the following Advanced Practice Providers on your designated Care Team:    Almyra Deforest, PA-C  Fabian Sharp, [place] or   Roby Lofts, [place]    Other Instructions Please call our office 2 months in advance to schedule your follow-up appointment with Dr. [name].

## 2020-06-23 ENCOUNTER — Telehealth: Payer: Self-pay | Admitting: Cardiology

## 2020-06-23 MED ORDER — APIXABAN 2.5 MG PO TABS: 2.5000 mg | ORAL_TABLET | Freq: Two times a day (BID) | ORAL | 3 refills | Status: DC

## 2020-06-23 NOTE — Telephone Encounter (Signed)
Veronica Young please let Ms Tedder know that I reviewed her labs. Based on her weight (which is now less than 60kg since she started daily Lasix) and age I think her Eliquis should be reduced to 2.5 mg BID, otherwise no changes.   Kerin Ransom PA-C 06/21/2020 9:18 AM   Patient called via interpreter-aware of results and recommendations.  New rx sent to pharmacy.

## 2020-06-23 NOTE — Telephone Encounter (Signed)
Patient returning call for lab results. 

## 2020-06-30 ENCOUNTER — Other Ambulatory Visit: Payer: Self-pay | Admitting: Cardiology

## 2020-07-03 ENCOUNTER — Telehealth: Payer: Self-pay | Admitting: Cardiology

## 2020-07-03 NOTE — Telephone Encounter (Signed)
Follow up   Rose called again, she wanted to clarify the pt needs wheelchair not just for the trip but pt been having back pain and would need wheelchair from now on

## 2020-07-03 NOTE — Telephone Encounter (Signed)
I am not sure what the indication would be for a wheelchair from a cardiac standpoint. If this is felt to be a permanent need for medical equipment I would have primary care take care of it.   Gerson Fauth [name] MD, [facility]

## 2020-07-03 NOTE — Telephone Encounter (Signed)
Returned call to patient's daughter [name] left Dr.Jordan's advice on personal voice mail.

## 2020-07-03 NOTE — Telephone Encounter (Signed)
Spoke to pt's daughter who report pt received approval from Dr. Gwenlyn Found to travel to [place]. Daughter voiced she doesn't feel her mother will be able to tolerate all the walking and wanted to see if Dr. Gwenlyn Found or Dr. [name] could write a prescription for a wheeler.   Will forward to both providers.

## 2020-07-03 NOTE — Telephone Encounter (Signed)
° ° °  Pt's daughter said Dr. Gwenlyn Found allowed pt to travel by the end of the month. She said they feel like pt needs a wheelchair, she is asking if Dr. [name] or Dr. Gwenlyn Found can write a prescription for wheelchair so insurance can pay for it.

## 2020-07-04 ENCOUNTER — Telehealth: Payer: Self-pay | Admitting: Cardiology

## 2020-07-04 NOTE — Telephone Encounter (Signed)
Spoke to patient's daughter [name].Dr.Jordan advised to call PCP for a wheelchair prescription.Advised Dr.Berry is out of office this week.She will have PCP write a prescription for a wheelchair.

## 2020-07-04 NOTE — Telephone Encounter (Signed)
Pt is calling for her mother to see if Dr. Gwenlyn Found would right a prescription to get her mother a wheelchair. Pt said that Malachy Mood told her that Dr. [name] would converse with Dr. Gwenlyn Found to discuss getting a wheelchair for her mother. Please call to advise

## 2020-07-17 ENCOUNTER — Ambulatory Visit (INDEPENDENT_AMBULATORY_CARE_PROVIDER_SITE_OTHER): Payer: Medicare HMO | Admitting: *Deleted

## 2020-07-17 DIAGNOSIS — I495 Sick sinus syndrome: Secondary | ICD-10-CM

## 2020-07-18 LAB — CUP PACEART REMOTE DEVICE CHECK
Battery Impedance: 543 Ohm
Battery Remaining Longevity: 85 mo
Battery Voltage: 2.78 V
Brady Statistic AP VP Percent: 2 %
Brady Statistic AP VS Percent: 18 %
Brady Statistic AS VP Percent: 2 %
Brady Statistic AS VS Percent: 79 %
Date Time Interrogation Session: 20210727071527
Implantable Lead Implant Date: 20150805
Implantable Lead Implant Date: 20150805
Implantable Lead Location: 753859
Implantable Lead Location: 753860
Implantable Lead Model: 5076
Implantable Lead Model: 5076
Implantable Pulse Generator Implant Date: 20150804
Lead Channel Impedance Value: 469 Ohm
Lead Channel Impedance Value: 485 Ohm
Lead Channel Pacing Threshold Amplitude: 0.75 V
Lead Channel Pacing Threshold Amplitude: 0.875 V
Lead Channel Pacing Threshold Pulse Width: 0.4 ms
Lead Channel Pacing Threshold Pulse Width: 0.4 ms
Lead Channel Setting Pacing Amplitude: 2 V
Lead Channel Setting Pacing Amplitude: 2.5 V
Lead Channel Setting Pacing Pulse Width: 0.4 ms
Lead Channel Setting Sensing Sensitivity: 5.6 mV

## 2020-07-19 NOTE — Progress Notes (Signed)
Remote pacemaker transmission.   

## 2020-08-01 ENCOUNTER — Telehealth: Payer: Self-pay | Admitting: Cardiology

## 2020-08-01 NOTE — Telephone Encounter (Signed)
    I went in pt's chart to see when she needs her next office visit.

## 2020-08-09 ENCOUNTER — Other Ambulatory Visit: Payer: Self-pay | Admitting: Cardiology

## 2020-10-16 ENCOUNTER — Ambulatory Visit (INDEPENDENT_AMBULATORY_CARE_PROVIDER_SITE_OTHER): Payer: Medicare HMO

## 2020-10-16 DIAGNOSIS — I495 Sick sinus syndrome: Secondary | ICD-10-CM

## 2020-10-17 LAB — CUP PACEART REMOTE DEVICE CHECK
Battery Impedance: 567 Ohm
Battery Remaining Longevity: 82 mo
Battery Voltage: 2.78 V
Brady Statistic AP VP Percent: 2 %
Brady Statistic AP VS Percent: 15 %
Brady Statistic AS VP Percent: 2 %
Brady Statistic AS VS Percent: 81 %
Date Time Interrogation Session: 20211026112358
Implantable Lead Implant Date: 20150805
Implantable Lead Implant Date: 20150805
Implantable Lead Location: 753859
Implantable Lead Location: 753860
Implantable Lead Model: 5076
Implantable Lead Model: 5076
Implantable Pulse Generator Implant Date: 20150804
Lead Channel Impedance Value: 489 Ohm
Lead Channel Impedance Value: 510 Ohm
Lead Channel Pacing Threshold Amplitude: 0.75 V
Lead Channel Pacing Threshold Amplitude: 0.875 V
Lead Channel Pacing Threshold Pulse Width: 0.4 ms
Lead Channel Pacing Threshold Pulse Width: 0.4 ms
Lead Channel Setting Pacing Amplitude: 2 V
Lead Channel Setting Pacing Amplitude: 2.5 V
Lead Channel Setting Pacing Pulse Width: 0.4 ms
Lead Channel Setting Sensing Sensitivity: 5.6 mV

## 2020-10-18 NOTE — Progress Notes (Signed)
Remote pacemaker transmission.   

## 2020-10-26 ENCOUNTER — Telehealth: Payer: Self-pay | Admitting: Cardiology

## 2020-10-26 NOTE — Telephone Encounter (Signed)
Pt's daughter called and needs to speak with a nurse regarding medication issues. Please call to consult

## 2020-10-26 NOTE — Telephone Encounter (Signed)
Spoke with patient's daughter. Patient's Eliquis will be 400$ the month of December and she is not sure what to do. Daughter will call back at the end of November to check on samples for December.

## 2020-10-27 NOTE — Telephone Encounter (Signed)
Called patient's daughter [name].Left message on personal voice mail Eliquis 2.5 mg samples with a patient assistance form left at [facility] office front desk.

## 2020-11-06 ENCOUNTER — Ambulatory Visit: Payer: Medicare HMO | Admitting: Cardiology

## 2020-11-13 ENCOUNTER — Other Ambulatory Visit: Payer: Self-pay

## 2020-11-13 ENCOUNTER — Encounter (HOSPITAL_COMMUNITY): Payer: Self-pay | Admitting: Emergency Medicine

## 2020-11-13 ENCOUNTER — Inpatient Hospital Stay (HOSPITAL_COMMUNITY)
Admission: EM | Admit: 2020-11-13 | Discharge: 2020-11-16 | DRG: 291 | Disposition: A | Discharge status: Home / Self Care | Payer: Medicare HMO | Attending: Internal Medicine | Admitting: Internal Medicine

## 2020-11-13 ENCOUNTER — Telehealth: Payer: Self-pay | Admitting: Cardiology

## 2020-11-13 ENCOUNTER — Emergency Department (HOSPITAL_COMMUNITY): Payer: Medicare HMO

## 2020-11-13 DIAGNOSIS — I5033 Acute on chronic diastolic (congestive) heart failure: Secondary | ICD-10-CM | POA: Diagnosis present

## 2020-11-13 DIAGNOSIS — D696 Thrombocytopenia, unspecified: Secondary | ICD-10-CM | POA: Diagnosis present

## 2020-11-13 DIAGNOSIS — N1832 Chronic kidney disease, stage 3b: Secondary | ICD-10-CM | POA: Diagnosis present

## 2020-11-13 DIAGNOSIS — J9601 Acute respiratory failure with hypoxia: Secondary | ICD-10-CM | POA: Diagnosis present

## 2020-11-13 DIAGNOSIS — F32A Depression, unspecified: Secondary | ICD-10-CM | POA: Diagnosis present

## 2020-11-13 DIAGNOSIS — Z8249 Family history of ischemic heart disease and other diseases of the circulatory system: Secondary | ICD-10-CM

## 2020-11-13 DIAGNOSIS — I509 Heart failure, unspecified: Secondary | ICD-10-CM

## 2020-11-13 DIAGNOSIS — H905 Unspecified sensorineural hearing loss: Secondary | ICD-10-CM | POA: Diagnosis present

## 2020-11-13 DIAGNOSIS — E039 Hypothyroidism, unspecified: Secondary | ICD-10-CM | POA: Diagnosis present

## 2020-11-13 DIAGNOSIS — I480 Paroxysmal atrial fibrillation: Secondary | ICD-10-CM | POA: Diagnosis present

## 2020-11-13 DIAGNOSIS — Z66 Do not resuscitate: Secondary | ICD-10-CM | POA: Diagnosis present

## 2020-11-13 DIAGNOSIS — Z823 Family history of stroke: Secondary | ICD-10-CM

## 2020-11-13 DIAGNOSIS — E876 Hypokalemia: Secondary | ICD-10-CM | POA: Diagnosis present

## 2020-11-13 DIAGNOSIS — Z7901 Long term (current) use of anticoagulants: Secondary | ICD-10-CM

## 2020-11-13 DIAGNOSIS — E785 Hyperlipidemia, unspecified: Secondary | ICD-10-CM | POA: Diagnosis present

## 2020-11-13 DIAGNOSIS — I4819 Other persistent atrial fibrillation: Secondary | ICD-10-CM | POA: Diagnosis present

## 2020-11-13 DIAGNOSIS — Z800 Family history of malignant neoplasm of digestive organs: Secondary | ICD-10-CM

## 2020-11-13 DIAGNOSIS — N183 Chronic kidney disease, stage 3 unspecified: Secondary | ICD-10-CM | POA: Diagnosis present

## 2020-11-13 DIAGNOSIS — R7989 Other specified abnormal findings of blood chemistry: Secondary | ICD-10-CM | POA: Diagnosis not present

## 2020-11-13 DIAGNOSIS — I495 Sick sinus syndrome: Secondary | ICD-10-CM | POA: Diagnosis present

## 2020-11-13 DIAGNOSIS — Z79890 Hormone replacement therapy (postmenopausal): Secondary | ICD-10-CM

## 2020-11-13 DIAGNOSIS — I739 Peripheral vascular disease, unspecified: Secondary | ICD-10-CM | POA: Diagnosis present

## 2020-11-13 DIAGNOSIS — I130 Hypertensive heart and chronic kidney disease with heart failure and stage 1 through stage 4 chronic kidney disease, or unspecified chronic kidney disease: Secondary | ICD-10-CM | POA: Diagnosis not present

## 2020-11-13 DIAGNOSIS — H409 Unspecified glaucoma: Secondary | ICD-10-CM | POA: Diagnosis present

## 2020-11-13 DIAGNOSIS — I70203 Unspecified atherosclerosis of native arteries of extremities, bilateral legs: Secondary | ICD-10-CM | POA: Diagnosis present

## 2020-11-13 DIAGNOSIS — T501X5A Adverse effect of loop [high-ceiling] diuretics, initial encounter: Secondary | ICD-10-CM | POA: Diagnosis present

## 2020-11-13 DIAGNOSIS — N189 Chronic kidney disease, unspecified: Secondary | ICD-10-CM | POA: Diagnosis present

## 2020-11-13 DIAGNOSIS — K219 Gastro-esophageal reflux disease without esophagitis: Secondary | ICD-10-CM | POA: Diagnosis present

## 2020-11-13 DIAGNOSIS — Z20822 Contact with and (suspected) exposure to covid-19: Secondary | ICD-10-CM | POA: Diagnosis present

## 2020-11-13 DIAGNOSIS — Z950 Presence of cardiac pacemaker: Secondary | ICD-10-CM

## 2020-11-13 DIAGNOSIS — Z79899 Other long term (current) drug therapy: Secondary | ICD-10-CM

## 2020-11-13 LAB — D-DIMER, QUANTITATIVE: D-Dimer, Quant: 1.09 ug/mL-FEU — ABNORMAL HIGH (ref 0.00–0.50)

## 2020-11-13 LAB — BRAIN NATRIURETIC PEPTIDE: B Natriuretic Peptide: 346.4 pg/mL — ABNORMAL HIGH (ref 0.0–100.0)

## 2020-11-13 LAB — BASIC METABOLIC PANEL
Anion gap: 9 (ref 5–15)
BUN: 33 mg/dL — ABNORMAL HIGH (ref 8–23)
CO2: 24 mmol/L (ref 22–32)
Calcium: 8.8 mg/dL — ABNORMAL LOW (ref 8.9–10.3)
Chloride: 99 mmol/L (ref 98–111)
Creatinine, Ser: 1.31 mg/dL — ABNORMAL HIGH (ref 0.44–1.00)
GFR, Estimated: 40 mL/min — ABNORMAL LOW (ref >60)
Glucose, Bld: 119 mg/dL — ABNORMAL HIGH (ref 70–99)
Potassium: 3.7 mmol/L (ref 3.5–5.1)
Sodium: 132 mmol/L — ABNORMAL LOW (ref 135–145)

## 2020-11-13 LAB — CBC
HCT: 40.3 % (ref 36.0–46.0)
Hemoglobin: 13.2 g/dL (ref 12.0–15.0)
MCH: 28.9 pg (ref 26.0–34.0)
MCHC: 32.8 g/dL (ref 30.0–36.0)
MCV: 88.4 fL (ref 80.0–100.0)
Platelets: 144 10*3/uL — ABNORMAL LOW (ref 150–400)
RBC: 4.56 MIL/uL (ref 3.87–5.11)
RDW: 16.8 % — ABNORMAL HIGH (ref 11.5–15.5)
WBC: 4.6 10*3/uL (ref 4.0–10.5)
nRBC: 0 % (ref 0.0–0.2)

## 2020-11-13 LAB — MAGNESIUM: Magnesium: 2.2 mg/dL (ref 1.7–2.4)

## 2020-11-13 LAB — TROPONIN I (HIGH SENSITIVITY): Troponin I (High Sensitivity): 6 ng/L (ref <18)

## 2020-11-13 MED ORDER — FUROSEMIDE 10 MG/ML IJ SOLN
20.0000 mg | Freq: Once | INTRAMUSCULAR | Status: AC
  Administered 2020-11-13: 20 mg via INTRAVENOUS
  Filled 2020-11-13: qty 4

## 2020-11-13 NOTE — Telephone Encounter (Signed)
Rose called to see if the patient could get help with her Eliquis. Want to know if samples can be provided till end of year if possible. Please call back

## 2020-11-13 NOTE — H&P (Signed)
History and Physical    Veronica Young SFK:[id number] DOB: [birth year] DOA: 11/13/2020  PCP: Katherina Mires, MD  Patient coming from: Home  I have personally briefly reviewed patient's old medical records in [place]  Chief Complaint: Shortness of breath  HPI: Veronica Young is a 86 y.o. female with medical history significant for chronic diastolic CHF (EF 17-49% by TTE 04/03/2020), persistent atrial fibrillation on Eliquis, SSS s/p PPM, PAD with bilateral popliteal artery occlusion, CKD stage III, hypothyroidism, HTN, HLD, GERD, depression, and congenital deafness who presents to the ED for evaluation of shortness of breath.  Daughter is at bedside and assists with interpretation via sign language.  Patient reports increased shortness of breath over the last 2 days.  Shortness of breath is worse with exertion.  She has not noticed significant orthopnea.  She denies any cough.  She has not had any palpitations.  She denies chest pain, nausea, vomiting, abdominal pain, dysuria, or diarrhea/constipation.  She has seen some slight swelling in her legs.  She has been taking Lasix 20 mg daily.  She says her baseline weight is around 130 pounds which has been fairly stable.  ED Course:  Initial vitals showed BP 163/100, pulse 79, RR 16, temp 97.6 Fahrenheit, SPO2 92% on room air.  Per EDP SPO2 dropped to 90% and patient was placed on 3 L of O2 via [place] with O2 sats maintaining around 92%.  Labs show sodium 132, potassium 3.7, bicarb 24, BUN 33, creatinine 1.31, serum glucose 119, WBC 4.6, hemoglobin 13.2, platelets 144,000, magnesium 2.2, BNP 346.4.  D-dimer 1.09, high-sensitivity troponin I 6.  Portable chest x-ray showed PPM in place, enlarged cardiac silhouette.  CTA chest PE study was ordered and pending.  Patient was given IV Lasix 20 mg once and the hospitalist service was consulted to admit for further evaluation and management.  Review of Systems: All systems reviewed and are  negative except as documented in history of present illness above.   Past Medical History:  Diagnosis Date  . Carotid bruit   . CHF (congestive heart failure) ([place])   . Deafness   . Diabetes mellitus    type 2  . Diastolic heart failure (HCC)    EF is 60%  . GERD (gastroesophageal reflux disease)   . Glaucoma   . Hypercholesterolemia   . Hypertension   . Hypothyroidism   . Normal cardiac stress test January 2014  . PAD (peripheral artery disease) ([place])    a. known bilateral popliteal occlusions with conservative management favored.  [name] PAT (paroxysmal atrial tachycardia) ([place])   . Sick sinus syndrome [facility])    a. s/p PPM placement 07/2014    Past Surgical History:  Procedure Laterality Date  . cholycystectomy    . left breast cyst removed    . PACEMAKER INSERTION  07-26-2014   MDT ADDRL1 pacemaker implanted by Dr Lovena Le for SSS  . PERMANENT PACEMAKER INSERTION N/A 07/26/2014   Procedure: PERMANENT PACEMAKER INSERTION;  Surgeon: Evans Lance, MD;  Location: [facility] CATH LAB;  Service: Cardiovascular;  Laterality: N/A;  . TOTAL KNEE ARTHROPLASTY     right    Social History:  reports that she has never smoked. She has never used smokeless tobacco. She reports that she does not drink alcohol and does not use drugs.  Allergies  Allergen Reactions  . Amlodipine Nausea Only       . Benicar [Olmesartan Medoxomil] Nausea Only    Family History  Problem Relation  Age of Onset  . Heart disease Mother   . Liver cancer Father   . Heart disease Sister   . Heart disease Brother   . Stroke Sister      Prior to Admission medications   Medication Sig Start Date End Date Taking? Authorizing Provider  acetaminophen (TYLENOL) 500 MG tablet Take 1,000 mg by mouth every 6 (six) hours as needed for moderate pain.   Yes [provider]  apixaban (ELIQUIS) 5 MG TABS tablet Take 5 mg by mouth 2 (two) times daily.   Yes [provider]  atorvastatin (LIPITOR) 80 MG tablet  Take 80 mg by mouth at bedtime.    Yes [provider]  atropine 1 % ophthalmic solution Place 1 drop into the left eye at bedtime.    Yes [provider]  brimonidine (ALPHAGAN) 0.2 % ophthalmic solution Place 1 drop into both eyes daily. 05/20/17  Yes [provider]  calcium-vitamin D (OSCAL WITH D) 500-200 MG-UNIT per tablet Take 1 tablet by mouth daily with breakfast.   Yes [provider]  cholecalciferol (VITAMIN D) 1000 UNITS tablet Take 1,000 Units by mouth daily.   Yes [provider]  citalopram (CELEXA) 20 MG tablet Take 20 mg by mouth at bedtime.  02/08/11  Yes [provider]  dorzolamide-timolol (COSOPT) 22.3-6.8 MG/ML ophthalmic solution Place 1 drop into both eyes 2 (two) times daily.    Yes [provider]  fluorometholone (FML) 0.1 % ophthalmic suspension Place 1 drop into the left eye 4 (four) times daily. 01/17/20  Yes [provider]  furosemide (LASIX) 20 MG tablet TAKE 1 TABLET BY MOUTH EVERY DAY Patient taking differently: Take 20 mg by mouth daily.  06/30/20  Yes Kilroy, Luke K, PA-C  gabapentin (NEURONTIN) 100 MG capsule Take 100 mg by mouth daily.  10/24/19  Yes [provider]  hydrALAZINE (APRESOLINE) 50 MG tablet Take 50 mg by mouth daily.  04/10/20  Yes Alvstad, Kristin L, RPH-CPP  latanoprost (XALATAN) 0.005 % ophthalmic solution Place 1 drop into both eyes in the morning and at bedtime.  11/25/15  Yes [provider]  levothyroxine (SYNTHROID, LEVOTHROID) 112 MCG tablet Take 112 mcg by mouth daily before breakfast.  03/31/14  Yes [provider]  metoprolol tartrate (LOPRESSOR) 25 MG tablet Take 1 tablet (25 mg total) by mouth 2 (two) times daily. 06/13/20  Yes [name], Peter M, MD  RHOPRESSA 0.02 % SOLN Place 1 drop into the left eye at bedtime. 10/28/19  Yes [provider]  apixaban (ELIQUIS) 2.5 MG TABS tablet Take 1 tablet (2.5 mg total) by mouth 2 (two) times  daily. Patient not taking: Reported on 11/13/2020 06/23/20   Erlene Quan, PA-C  lisinopril (ZESTRIL) 40 MG tablet TAKE 1 TABLET EVERY DAY Patient not taking: Reported on 11/13/2020 08/10/20   Pixie Casino, MD    Physical Exam: Vitals:   11/13/20 2100 11/13/20 2145 11/13/20 2245 11/13/20 2330  BP: (!) 156/100 116/81 (!) 163/98 (!) 166/77  Pulse: 69 75 77 64  Resp: (!) 22 (!) 25 15 18   Temp:      TempSrc:      SpO2: 95% 96% 93% 94%  Weight:      Height:       Constitutional: Resting in bed with head slightly elevated, NAD, calm, comfortable Eyes: PERRL, lids and conjunctivae normal ENMT: Mucous membranes are moist. Posterior pharynx clear of any exudate or lesions.Normal dentition.  Neck: normal, supple, no  masses. Respiratory: Bibasilar inspiratory crackles, no wheezing.  Normal respiratory effort. No accessory muscle use.  Cardiovascular: Irregularly irregular, no murmurs / rubs / gallops.  Trace bilateral lower extremity edema. 2+ pedal pulses.  PPM in place left chest wall. Abdomen: no tenderness, no masses palpated. No hepatosplenomegaly. Bowel sounds positive.  Musculoskeletal: no clubbing / cyanosis. No joint deformity upper and lower extremities. Good ROM, no contractures. Normal muscle tone.  Skin: no rashes, lesions, ulcers. No induration Neurologic: CN 2-12 grossly intact. Sensation intact, Strength 5/5 in all 4.  Psychiatric: Normal judgment and insight. Alert and oriented x 3. Normal mood.   Labs on Admission: I have personally reviewed following labs and imaging studies  CBC: Recent Labs  Lab 11/13/20 2200  WBC 4.6  HGB 13.2  HCT 40.3  MCV 88.4  PLT 841*   Basic Metabolic Panel: Recent Labs  Lab 11/13/20 2200  NA 132*  K 3.7  CL 99  CO2 24  GLUCOSE 119*  BUN 33*  CREATININE 1.31*  CALCIUM 8.8*  MG 2.2   GFR: Estimated Creatinine Clearance: 28.3 mL/min (A) (by C-G formula based on SCr of 1.31 mg/dL (H)). Liver Function Tests: No results for  input(s): AST, ALT, ALKPHOS, BILITOT, PROT, ALBUMIN in the last 168 hours. No results for input(s): LIPASE, AMYLASE in the last 168 hours. No results for input(s): AMMONIA in the last 168 hours. Coagulation Profile: No results for input(s): INR, PROTIME in the last 168 hours. Cardiac Enzymes: No results for input(s): CKTOTAL, CKMB, CKMBINDEX, TROPONINI in the last 168 hours. BNP (last 3 results) No results for input(s): PROBNP in the last 8760 hours. HbA1C: No results for input(s): HGBA1C in the last 72 hours. CBG: No results for input(s): GLUCAP in the last 168 hours. Lipid Profile: No results for input(s): CHOL, HDL, LDLCALC, TRIG, CHOLHDL, LDLDIRECT in the last 72 hours. Thyroid Function Tests: No results for input(s): TSH, T4TOTAL, FREET4, T3FREE, THYROIDAB in the last 72 hours. Anemia Panel: No results for input(s): VITAMINB12, FOLATE, FERRITIN, TIBC, IRON, RETICCTPCT in the last 72 hours. Urine analysis:    Component Value Date/Time   COLORURINE YELLOW 11/27/2012 1120   APPEARANCEUR CLOUDY (A) 11/27/2012 1120   LABSPEC 1.011 11/27/2012 1120   PHURINE 7.0 11/27/2012 1120   GLUCOSEU NEGATIVE 11/27/2012 1120   HGBUR NEGATIVE 11/27/2012 1120   BILIRUBINUR NEGATIVE 11/27/2012 1120   KETONESUR NEGATIVE 11/27/2012 1120   PROTEINUR NEGATIVE 11/27/2012 1120   UROBILINOGEN 0.2 11/27/2012 1120   NITRITE NEGATIVE 11/27/2012 1120   LEUKOCYTESUR NEGATIVE 11/27/2012 1120    Radiological Exams on Admission: DG Chest Portable 1 View  Result Date: 11/13/2020 CLINICAL DATA:  Dyspnea EXAM: PORTABLE CHEST 1 VIEW COMPARISON:  04/05/2020 FINDINGS: Patient rotation limits the examination. Cardiac pacemaker. Probable mild cardiac enlargement. No vascular congestion, edema, or consolidation. No pleural effusions. No pneumothorax. IMPRESSION: Cardiac enlargement. Electronically Signed   By: Lucienne Capers M.D.   On: 11/13/2020 22:42    EKG: Pending.  Assessment/Plan Principal Problem:    Acute respiratory failure with hypoxia (HCC) Active Problems:   Hypothyroidism   PAD (peripheral artery disease) (HCC)   Cardiac pacemaker in situ   Persistent atrial fibrillation (HCC)   Acute on chronic diastolic CHF (congestive heart failure) (HCC)   CRI (chronic renal insufficiency), stage 3 (moderate) (HCC)  Veronica Young is a 86 y.o. female with medical history significant for chronic diastolic CHF (EF 32-44% by TTE 04/03/2020), persistent atrial fibrillation on Eliquis, SSS s/p PPM, PAD with bilateral popliteal  artery occlusion, CKD stage III, hypothyroidism, HTN, HLD, GERD, depression, and congenital deafness who is admitted with acute respiratory failure with hypoxia.  Acute respiratory failure with hypoxia Acute on chronic diastolic CHF: Patient requiring 3 L supplemental O2 via [place] on admission.  She does not require use of supplemental O2 as an outpatient.  BNP slightly elevated from prior.  Working diagnosis is acute on chronic diastolic CHF based on clinical picture however does not have significant peripheral edema on exam or pulmonary edema on chest x-ray.  She has been given IV Lasix 20 mg once in the ED.  Last echocardiogram 04/03/2020 showed EF 55-60%.  D-dimer slightly elevated but this is a chronic lab finding for patient.  Have low suspicion for PE as she is not tachycardic and has been on anticoagulation with Eliquis.  -Continue IV Lasix 40 mg twice daily -Obtain noncontrast CT chest -Monitor strict I/O's and daily weights -Supplement potassium -Continue supplemental O2 and wean as able  Persistent atrial fibrillation SSS s/p PPM: Remains in atrial fibrillation with controlled rate. -Continue Eliquis -Continue Lopressor 25 mg twice daily  PAD: Has known bilateral popliteal artery occlusion.  Follows with cardiology, Dr. Gwenlyn Found yet not considered candidate for intervention.  Currently on medical management. -Continue Eliquis and atorvastatin  CKD stage III: Chronic  is stable.  Continue to monitor.  Hypertension: Continue Lopressor and hydralazine.  Hypothyroidism: Continue Synthroid.  Hyperlipidemia: Continue atorvastatin.  Depression: Continue Celexa.  DVT prophylaxis: Eliquis Code Status: DNR, confirmed with patient Family Communication: Discussed with patient's daughter at bedside Disposition Plan: From home and likely discharge to home pending symptomatic improvement and ability to wean off supplemental O2 Consults called: None Admission status:  Status is: Observation  The patient remains OBS appropriate and will d/c before 2 midnights.  Dispo: The patient is from: Home              Anticipated d/c is to: Home              Anticipated d/c date is: 1 day              Patient currently is not medically stable to d/c.  Zada Finders MD [facility]  If 7PM-7AM, please contact night-coverage [email or web address]  11/14/2020, 12:01 AM

## 2020-11-13 NOTE — ED Triage Notes (Signed)
Pt arriving with daughter with concern for increased edema in her legs. Pt is on 20mg  Lasix daily. Pt is hard of hearing, uses sign language. Pt ambulatory

## 2020-11-13 NOTE — Telephone Encounter (Signed)
**Note De-Identified Veronica Young Obfuscation** The pts daughter and DPR Veronica Young states that they have applied for pt asst through [facility] but was denied but she cannot remember why. I did advise her of the pts options as follows:  If the pt was denied asst through [facility] for whatever reason and she cannot afford the cost of Eliquis she and the pt should consider (if Dr Lovena Le is in agreement) switching the pt to a less costing anticoagulant as it is important that she takes as directed without any missed doses.  We discussed the pt switching to Xarelto as they also have a pt asst program that the pt can apply for and that if they deny her that there is another Xarelto cost savings program called [facility] where all she would need to do is to call them on her mom's behalf and register her for the program and agree to pay $85 a month for her Xarelto and that this program starts on March 23, 2021 so the pt will pay normal cost until then.  We also discussed switching the pt to Warfarin but Veronica Young is not interested in that.  I advised Rose that we will leave the pt 2 boxes of Eliquis 2.5 mg samples at the front desk at Dr Tanna Furry office at [facility] [address] in [place] for her to pick up and that we will not be able to give samples for the rest of this year as our supply are limited.  She thanked me for my call.

## 2020-11-13 NOTE — ED Provider Notes (Signed)
[facility] DEPT Provider Note   CSN: [id number] Arrival date & time: 11/13/20  2013     History Chief Complaint  Patient presents with  . Leg Swelling    bilat    Veronica Young is a 86 y.o. female.  HPI   Pt has been feeling short of breath the last couple of days.  THe sx have been gradually worsening.  She may have had some slight increased swelling of her legs..  Pt has a history of chf. She has been taking her medications.  She normally does not require oxygen.  No CP, no fevers, no cough.  No weight gain.   Past Medical History:  Diagnosis Date  . Carotid bruit   . CHF (congestive heart failure) ([place])   . Deafness   . Diabetes mellitus    type 2  . Diastolic heart failure (HCC)    EF is 60%  . GERD (gastroesophageal reflux disease)   . Glaucoma   . Hypercholesterolemia   . Hypertension   . Hypothyroidism   . Normal cardiac stress test January 2014  . PAD (peripheral artery disease) ([place])    a. known bilateral popliteal occlusions with conservative management favored.  [name] PAT (paroxysmal atrial tachycardia) ([place])   . Sick sinus syndrome [facility])    a. s/p PPM placement 07/2014    Patient Active Problem List   Diagnosis Date Noted  . Chronic anticoagulation 05/24/2020  . CRI (chronic renal insufficiency), stage 3 (moderate) ([place]) 05/24/2020  . Acute on chronic congestive heart failure ([place])   . Acute respiratory failure with hypoxia ([place]) 04/02/2020  . Acute on chronic diastolic CHF (congestive heart failure) ([place]) 04/02/2020  . Acute respiratory failure ([place]) 04/02/2020  . CAD in native artery 06/16/2017  . PAF (paroxysmal atrial fibrillation) ([place]) 06/16/2017  . Sinus node dysfunction ([place]) 09/10/2016  . Cardiac pacemaker in situ 11/08/2014  . HTN (hypertension) 09/22/2014  . Hypertensive urgency 09/15/2014  . Chest pain 09/15/2014  . Chronic diastolic CHF (congestive heart failure) ([place]) 11/27/2012  . PAD (peripheral  artery disease) ([place]) 03/18/2012  . Claudication ([place]) 02/19/2012  . Atrial tachycardia ([place]) 06/27/2011  . Hypercholesterolemia   . Deafness   . Carotid bruit   . GERD (gastroesophageal reflux disease)   . Hypothyroidism     Past Surgical History:  Procedure Laterality Date  . cholycystectomy    . left breast cyst removed    . PACEMAKER INSERTION  07-26-2014   MDT ADDRL1 pacemaker implanted by Dr Lovena Le for SSS  . PERMANENT PACEMAKER INSERTION N/A 07/26/2014   Procedure: PERMANENT PACEMAKER INSERTION;  Surgeon: Evans Lance, MD;  [facility] C[facility] LAB;  Service: Cardiovascular;  Laterality: N/A;  . TOTAL KNEE ARTHROPLASTY     right     OB History   No obstetric history on file.     Family History  Problem Relation Age of Onset  . Heart disease Mother   . Liver cancer Father   . Heart disease Sister   . Heart disease Brother   . Stroke Sister     Social History   Tobacco Use  . Smoking status: Never Smoker  . Smokeless tobacco: Never Used  Substance Use Topics  . Alcohol use: No  . Drug use: No    Home Medications Prior to Admission medications   Medication Sig Start Date End Date Taking? Authorizing Provider  acetaminophen (TYLENOL) 500 MG tablet Take 1,000 mg by mouth every 6 (six) hours  as needed for moderate pain.   Yes [provider]  apixaban (ELIQUIS) 5 MG TABS tablet Take 5 mg by mouth 2 (two) times daily.   Yes [provider]  atorvastatin (LIPITOR) 80 MG tablet Take 80 mg by mouth at bedtime.    Yes [provider]  atropine 1 % ophthalmic solution Place 1 drop into the left eye at bedtime.    Yes [provider]  brimonidine (ALPHAGAN) 0.2 % ophthalmic solution Place 1 drop into both eyes daily. 05/20/17  Yes [provider]  calcium-vitamin D (OSCAL WITH D) 500-200 MG-UNIT per tablet Take 1 tablet by mouth daily with breakfast.   Yes [provider]  cholecalciferol (VITAMIN D) 1000 UNITS tablet  Take 1,000 Units by mouth daily.   Yes [provider]  citalopram (CELEXA) 20 MG tablet Take 20 mg by mouth at bedtime.  02/08/11  Yes [provider]  dorzolamide-timolol (COSOPT) 22.3-6.8 MG/ML ophthalmic solution Place 1 drop into both eyes 2 (two) times daily.    Yes [provider]  fluorometholone (FML) 0.1 % ophthalmic suspension Place 1 drop into the left eye 4 (four) times daily. 01/17/20  Yes [provider]  furosemide (LASIX) 20 MG tablet TAKE 1 TABLET BY MOUTH EVERY DAY Patient taking differently: Take 20 mg by mouth daily.  06/30/20  Yes Kilroy, Luke K, PA-C  gabapentin (NEURONTIN) 100 MG capsule Take 100 mg by mouth daily.  10/24/19  Yes [provider]  hydrALAZINE (APRESOLINE) 50 MG tablet Take 50 mg by mouth daily.  04/10/20  Yes Alvstad, Kristin L, RPH-CPP  latanoprost (XALATAN) 0.005 % ophthalmic solution Place 1 drop into both eyes in the morning and at bedtime.  11/25/15  Yes [provider]  levothyroxine (SYNTHROID, LEVOTHROID) 112 MCG tablet Take 112 mcg by mouth daily before breakfast.  03/31/14  Yes [provider]  metoprolol tartrate (LOPRESSOR) 25 MG tablet Take 1 tablet (25 mg total) by mouth 2 (two) times daily. 06/13/20  Yes [name], Peter M, MD  RHOPRESSA 0.02 % SOLN Place 1 drop into the left eye at bedtime. 10/28/19  Yes [provider]  apixaban (ELIQUIS) 2.5 MG TABS tablet Take 1 tablet (2.5 mg total) by mouth 2 (two) times daily. Patient not taking: Reported on 11/13/2020 06/23/20   Erlene Quan, PA-C  lisinopril (ZESTRIL) 40 MG tablet TAKE 1 TABLET EVERY DAY Patient not taking: Reported on 11/13/2020 08/10/20   Pixie Casino, MD    Allergies    Amlodipine and Benicar [olmesartan medoxomil]  Review of Systems   Review of Systems  All other systems reviewed and are negative.   Physical Exam Updated Vital Signs BP (!) 163/98   Pulse 77   Temp 97.6 F (36.4 C) (Oral)   Resp 15   Ht  1.651 m (5\' 5" )   Wt 59 kg   SpO2 93%   BMI 21.63 kg/m   Physical Exam Vitals and nursing note reviewed.  Constitutional:      General: She is not in acute distress.    Appearance: She is well-developed.  HENT:     Head: Normocephalic and atraumatic.     Right Ear: External ear normal.     Left Ear: External ear normal.  Eyes:     General: No scleral icterus.       Right eye: No discharge.        Left eye: No discharge.     Conjunctiva/sclera: Conjunctivae normal.  Neck:     Trachea: No tracheal deviation.  Cardiovascular:     Rate and Rhythm: Normal rate and regular rhythm.  Pulmonary:     Effort: Pulmonary effort is normal. No respiratory distress.     Breath sounds: No stridor. Rales present. No wheezing.  Abdominal:     General: Bowel sounds are normal. There is no distension.     Palpations: Abdomen is soft.     Tenderness: There is no abdominal tenderness. There is no guarding or rebound.  Musculoskeletal:        General: No tenderness or signs of injury.     Cervical back: Neck supple.     Right lower leg: Edema present.     Left lower leg: Edema present.  Skin:    General: Skin is warm and dry.     Findings: No rash.  Neurological:     Mental Status: She is alert.     Cranial Nerves: No cranial nerve deficit (no facial droop, extraocular movements intact, no slurred speech).     Sensory: No sensory deficit.     Motor: No abnormal muscle tone or seizure activity.     Coordination: Coordination normal.     ED Results / Procedures / Treatments   Labs (all labs ordered are listed, but only abnormal results are displayed) Labs Reviewed  BASIC METABOLIC PANEL - Abnormal; Notable for the following components:      Result Value   Sodium 132 (*)    Glucose, Bld 119 (*)    BUN 33 (*)    Creatinine, Ser 1.31 (*)    Calcium 8.8 (*)    GFR, Estimated 40 (*)    All other components within normal limits  BRAIN NATRIURETIC PEPTIDE - Abnormal; Notable for the  following components:   B Natriuretic Peptide 346.4 (*)    All other components within normal limits  CBC - Abnormal; Notable for the following components:   RDW 16.8 (*)    Platelets 144 (*)    All other components within normal limits  D-DIMER, QUANTITATIVE (NOT AT [facility]) - Abnormal; Notable for the following components:   D-Dimer, Quant 1.09 (*)    All other components within normal limits  MAGNESIUM  TROPONIN I (HIGH SENSITIVITY)  TROPONIN I (HIGH SENSITIVITY)    EKG None  Radiology DG Chest Portable 1 View  Result Date: 11/13/2020 CLINICAL DATA:  Dyspnea EXAM: PORTABLE CHEST 1 VIEW COMPARISON:  04/05/2020 FINDINGS: Patient rotation limits the examination. Cardiac pacemaker. Probable mild cardiac enlargement. No vascular congestion, edema, or consolidation. No pleural effusions. No pneumothorax. IMPRESSION: Cardiac enlargement. Electronically Signed   By: Lucienne Capers M.D.   On: 11/13/2020 22:42    Procedures .Critical Care Performed by: Dorie Rank, MD Authorized by: Dorie Rank, MD   Critical care provider statement:    Critical care time (minutes):  30   Critical care was time spent personally by me on the following activities:  Discussions with consultants, evaluation of patient's response to treatment, examination of patient, ordering and performing treatments and interventions, ordering and review of laboratory studies, ordering and review of radiographic studies, pulse oximetry, re-evaluation of patient's condition, obtaining history from patient or surrogate and review of old charts   (including critical care time)  Medications Ordered in ED Medications  furosemide (LASIX) injection 20 mg (20 mg Intravenous Given 11/13/20 2301)    ED Course  I have reviewed the triage vital signs and the nursing notes.  Pertinent labs & imaging  results that were available during my care of the patient were reviewed by me and considered in my medical decision making (see chart  for details).  Clinical Course as of Nov 13 2344  Mon Nov 13, 2020  2249 Hyponatremia slightly increased compared to previous.  Creatinine stable compared to previous.   [JK]  2249 CBC unremarkable.  Troponin and magnesium normal   [JK]  2249 Chest x-ray shows cardiac enlargement without vascular congestion   [JK]  2250 We will add on a D-dimer test.  We will also give a dose of 20 mg Lasix   [JK]  2330 Patient reexamined.  Still requiring supplemental oxygen.  On 3 L nasal cannula.   [JK]    Clinical Course User Index [JK] Dorie Rank, MD   MDM Rules/Calculators/A&P                          Patient presented to the ED for evaluation of shortness of breath.  Patient has noticed some increasing edema in her legs.  She does have history of CHF but has been take her on her diuretics.  On exam the patient does have a new oxygen requirement.  She is on 3 L nasal cannula and sats have remained in the 90s.  Patient's laboratory test did show an elevated BNP.  Chest x-ray does show cardiac enlargement but no significant vascular congestion.  She does have rales on exam.  Suspect symptoms are related to a CHF exacerbation but did add on a D-dimer that is elevated.  I will order CT angiogram to rule out PE.  Regardless patient will need to be admitted because of her oxygen requirement.  Final Clinical Impression(s) / ED Diagnoses Final diagnoses:  Acute on chronic congestive heart failure, unspecified heart failure type [facility])    Rx / DC Orders ED Discharge Orders    None       Dorie Rank, MD 11/13/20 2345

## 2020-11-13 NOTE — Telephone Encounter (Signed)
Pt's daughter states the pt has been experiencing severe back pain that is limiting the pt's ability to take a deep breath today. Consequently, the pt seems somewhat short of breath. The pt's daughter is wondering if an additional dose of lasix would be helpful for the pt. The pain and shortness of breath are worsened when the pt is up and moving around her home, and both the pain and the breathing improve when the pt is settled in her chair with a heating pad.  Today the pt's weight is 131 lb, which is stable and not increased from yesterday. Pt's daughter reports that the pt's feet are not swollen.   Advised the pt's daughter that adding another dose of a diuretic in the evening may make the pt's back pain worse as she will likely be getting up into the night to urinate. Advised the pt that, per Dr. [name], the pt may take an additional lasix if she gains greater than 3 lbs in one evening or  begins to experience swelling. Advised pt's daughter to monitor the pt's BP closely too. Furthermore, she was encouraged to follow up with the provider managing the pt's back pain for additional recommendations. Urged to seek evaluation for acute shortness of breath, dizziness, loss of consciousness, chest pain. The patient's daughter verbalizes understanding and agreement with plan.

## 2020-11-13 NOTE — Telephone Encounter (Signed)
  Pt c/o Shortness Of Breath: STAT if SOB developed within the last 24 hours or pt is noticeably SOB on the phone  1. Are you currently SOB (can you hear that pt is SOB on the phone)? no  2. How long have you been experiencing SOB? This afternoon  3. Are you SOB when sitting or when up moving around? Moving around  4. Are you currently experiencing any other symptoms? no   Patient's daughter states the patient can't take deep breaths because of the back pain. She wants to know if she can take an extra lasix.

## 2020-11-13 NOTE — Telephone Encounter (Signed)
Will do, thanks

## 2020-11-14 ENCOUNTER — Observation Stay (HOSPITAL_COMMUNITY): Payer: Medicare HMO

## 2020-11-14 DIAGNOSIS — N1832 Chronic kidney disease, stage 3b: Secondary | ICD-10-CM | POA: Diagnosis present

## 2020-11-14 DIAGNOSIS — E876 Hypokalemia: Secondary | ICD-10-CM | POA: Diagnosis present

## 2020-11-14 DIAGNOSIS — H409 Unspecified glaucoma: Secondary | ICD-10-CM | POA: Diagnosis present

## 2020-11-14 DIAGNOSIS — I70203 Unspecified atherosclerosis of native arteries of extremities, bilateral legs: Secondary | ICD-10-CM | POA: Diagnosis present

## 2020-11-14 DIAGNOSIS — I4819 Other persistent atrial fibrillation: Secondary | ICD-10-CM

## 2020-11-14 DIAGNOSIS — Z950 Presence of cardiac pacemaker: Secondary | ICD-10-CM | POA: Diagnosis not present

## 2020-11-14 DIAGNOSIS — I739 Peripheral vascular disease, unspecified: Secondary | ICD-10-CM

## 2020-11-14 DIAGNOSIS — J9601 Acute respiratory failure with hypoxia: Secondary | ICD-10-CM | POA: Diagnosis present

## 2020-11-14 DIAGNOSIS — N183 Chronic kidney disease, stage 3 unspecified: Secondary | ICD-10-CM

## 2020-11-14 DIAGNOSIS — Z79890 Hormone replacement therapy (postmenopausal): Secondary | ICD-10-CM | POA: Diagnosis not present

## 2020-11-14 DIAGNOSIS — K219 Gastro-esophageal reflux disease without esophagitis: Secondary | ICD-10-CM | POA: Diagnosis present

## 2020-11-14 DIAGNOSIS — E039 Hypothyroidism, unspecified: Secondary | ICD-10-CM

## 2020-11-14 DIAGNOSIS — F32A Depression, unspecified: Secondary | ICD-10-CM | POA: Diagnosis present

## 2020-11-14 DIAGNOSIS — R7989 Other specified abnormal findings of blood chemistry: Secondary | ICD-10-CM | POA: Diagnosis present

## 2020-11-14 DIAGNOSIS — Z800 Family history of malignant neoplasm of digestive organs: Secondary | ICD-10-CM | POA: Diagnosis not present

## 2020-11-14 DIAGNOSIS — E785 Hyperlipidemia, unspecified: Secondary | ICD-10-CM | POA: Diagnosis present

## 2020-11-14 DIAGNOSIS — I5033 Acute on chronic diastolic (congestive) heart failure: Secondary | ICD-10-CM | POA: Diagnosis not present

## 2020-11-14 DIAGNOSIS — Z7901 Long term (current) use of anticoagulants: Secondary | ICD-10-CM | POA: Diagnosis not present

## 2020-11-14 DIAGNOSIS — H905 Unspecified sensorineural hearing loss: Secondary | ICD-10-CM | POA: Diagnosis present

## 2020-11-14 DIAGNOSIS — D696 Thrombocytopenia, unspecified: Secondary | ICD-10-CM | POA: Diagnosis present

## 2020-11-14 DIAGNOSIS — Z823 Family history of stroke: Secondary | ICD-10-CM | POA: Diagnosis not present

## 2020-11-14 DIAGNOSIS — Z20822 Contact with and (suspected) exposure to covid-19: Secondary | ICD-10-CM | POA: Diagnosis present

## 2020-11-14 DIAGNOSIS — I495 Sick sinus syndrome: Secondary | ICD-10-CM | POA: Diagnosis present

## 2020-11-14 DIAGNOSIS — T501X5A Adverse effect of loop [high-ceiling] diuretics, initial encounter: Secondary | ICD-10-CM | POA: Diagnosis present

## 2020-11-14 DIAGNOSIS — Z79899 Other long term (current) drug therapy: Secondary | ICD-10-CM | POA: Diagnosis not present

## 2020-11-14 DIAGNOSIS — Z66 Do not resuscitate: Secondary | ICD-10-CM | POA: Diagnosis present

## 2020-11-14 DIAGNOSIS — I130 Hypertensive heart and chronic kidney disease with heart failure and stage 1 through stage 4 chronic kidney disease, or unspecified chronic kidney disease: Secondary | ICD-10-CM | POA: Diagnosis present

## 2020-11-14 LAB — BASIC METABOLIC PANEL
Anion gap: 9 (ref 5–15)
BUN: 29 mg/dL — ABNORMAL HIGH (ref 8–23)
CO2: 24 mmol/L (ref 22–32)
Calcium: 8.4 mg/dL — ABNORMAL LOW (ref 8.9–10.3)
Chloride: 103 mmol/L (ref 98–111)
Creatinine, Ser: 1.23 mg/dL — ABNORMAL HIGH (ref 0.44–1.00)
GFR, Estimated: 43 mL/min — ABNORMAL LOW (ref >60)
Glucose, Bld: 125 mg/dL — ABNORMAL HIGH (ref 70–99)
Potassium: 3 mmol/L — ABNORMAL LOW (ref 3.5–5.1)
Sodium: 136 mmol/L (ref 135–145)

## 2020-11-14 LAB — RESP PANEL BY RT-PCR (FLU A&B, COVID) ARPGX2
Influenza A by PCR: NEGATIVE
Influenza B by PCR: NEGATIVE
SARS Coronavirus 2 by RT PCR: NEGATIVE

## 2020-11-14 LAB — CBC
HCT: 34.9 % — ABNORMAL LOW (ref 36.0–46.0)
Hemoglobin: 11.1 g/dL — ABNORMAL LOW (ref 12.0–15.0)
MCH: 29 pg (ref 26.0–34.0)
MCHC: 31.8 g/dL (ref 30.0–36.0)
MCV: 91.1 fL (ref 80.0–100.0)
Platelets: 121 10*3/uL — ABNORMAL LOW (ref 150–400)
RBC: 3.83 MIL/uL — ABNORMAL LOW (ref 3.87–5.11)
RDW: 16.7 % — ABNORMAL HIGH (ref 11.5–15.5)
WBC: 4 10*3/uL (ref 4.0–10.5)
nRBC: 0 % (ref 0.0–0.2)

## 2020-11-14 LAB — TROPONIN I (HIGH SENSITIVITY): Troponin I (High Sensitivity): 9 ng/L (ref <18)

## 2020-11-14 LAB — MAGNESIUM: Magnesium: 2.2 mg/dL (ref 1.7–2.4)

## 2020-11-14 MED ORDER — ATROPINE SULFATE 1 % OP SOLN
1.0000 [drp] | Freq: Every day | OPHTHALMIC | Status: DC
  Filled 2020-11-14: qty 2

## 2020-11-14 MED ORDER — METOPROLOL TARTRATE 25 MG PO TABS
25.0000 mg | ORAL_TABLET | Freq: Two times a day (BID) | ORAL | Status: DC
  Administered 2020-11-14 – 2020-11-16 (×6): 25 mg via ORAL
  Filled 2020-11-14 (×6): qty 1

## 2020-11-14 MED ORDER — ATORVASTATIN CALCIUM 40 MG PO TABS
80.0000 mg | ORAL_TABLET | Freq: Every day | ORAL | Status: DC
  Administered 2020-11-15: 80 mg via ORAL
  Filled 2020-11-14 (×2): qty 2

## 2020-11-14 MED ORDER — APIXABAN 2.5 MG PO TABS
2.5000 mg | ORAL_TABLET | Freq: Two times a day (BID) | ORAL | Status: DC
  Administered 2020-11-14 – 2020-11-16 (×5): 2.5 mg via ORAL
  Filled 2020-11-14 (×6): qty 1

## 2020-11-14 MED ORDER — FLUOROMETHOLONE 0.1 % OP SUSP
1.0000 [drp] | Freq: Four times a day (QID) | OPHTHALMIC | Status: DC
  Administered 2020-11-14 – 2020-11-16 (×9): 1 [drp] via OPHTHALMIC
  Filled 2020-11-14: qty 5

## 2020-11-14 MED ORDER — ATROPINE SULFATE 1 % OP SOLN
1.0000 [drp] | Freq: Every day | OPHTHALMIC | Status: DC
  Administered 2020-11-14 – 2020-11-15 (×3): 1 [drp] via OPHTHALMIC
  Filled 2020-11-14: qty 2

## 2020-11-14 MED ORDER — ACETAMINOPHEN 325 MG PO TABS
650.0000 mg | ORAL_TABLET | Freq: Four times a day (QID) | ORAL | Status: DC | PRN
  Administered 2020-11-14: 650 mg via ORAL
  Filled 2020-11-14: qty 2

## 2020-11-14 MED ORDER — ONDANSETRON HCL 4 MG/2ML IJ SOLN: 4.0000 mg | Freq: Four times a day (QID) | INTRAMUSCULAR | Status: DC | PRN

## 2020-11-14 MED ORDER — POTASSIUM CHLORIDE CRYS ER 20 MEQ PO TBCR
40.0000 meq | EXTENDED_RELEASE_TABLET | Freq: Two times a day (BID) | ORAL | Status: DC
  Administered 2020-11-14: 40 meq via ORAL
  Filled 2020-11-14: qty 2

## 2020-11-14 MED ORDER — ONDANSETRON HCL 4 MG PO TABS: 4.0000 mg | ORAL_TABLET | Freq: Four times a day (QID) | ORAL | Status: DC | PRN

## 2020-11-14 MED ORDER — CITALOPRAM HYDROBROMIDE 20 MG PO TABS
20.0000 mg | ORAL_TABLET | Freq: Every day | ORAL | Status: DC
  Administered 2020-11-14: 20 mg via ORAL
  Filled 2020-11-14: qty 1

## 2020-11-14 MED ORDER — POTASSIUM CHLORIDE 20 MEQ PO PACK: 20.0000 meq | PACK | Freq: Every day | ORAL | Status: DC

## 2020-11-14 MED ORDER — FLUOROMETHOLONE 0.1 % OP SUSP
1.0000 [drp] | Freq: Four times a day (QID) | OPHTHALMIC | Status: DC
  Filled 2020-11-14: qty 5

## 2020-11-14 MED ORDER — GABAPENTIN 100 MG PO CAPS: 100.0000 mg | ORAL_CAPSULE | Freq: Every day | ORAL | Status: DC

## 2020-11-14 MED ORDER — HYDRALAZINE HCL 50 MG PO TABS
50.0000 mg | ORAL_TABLET | Freq: Every day | ORAL | Status: DC
  Administered 2020-11-14: 50 mg via ORAL
  Filled 2020-11-14: qty 1

## 2020-11-14 MED ORDER — POTASSIUM CHLORIDE 20 MEQ PO PACK
40.0000 meq | PACK | Freq: Two times a day (BID) | ORAL | Status: DC
  Administered 2020-11-14: 40 meq via ORAL
  Filled 2020-11-14: qty 2

## 2020-11-14 MED ORDER — FUROSEMIDE 10 MG/ML IJ SOLN
40.0000 mg | Freq: Two times a day (BID) | INTRAMUSCULAR | Status: DC
  Administered 2020-11-14 – 2020-11-16 (×5): 40 mg via INTRAVENOUS
  Filled 2020-11-14 (×5): qty 4

## 2020-11-14 MED ORDER — LEVOTHYROXINE SODIUM 112 MCG PO TABS
112.0000 ug | ORAL_TABLET | Freq: Every day | ORAL | Status: DC
  Administered 2020-11-14 – 2020-11-16 (×3): 112 ug via ORAL
  Filled 2020-11-14 (×4): qty 1

## 2020-11-14 MED ORDER — BRIMONIDINE TARTRATE 0.2 % OP SOLN
1.0000 [drp] | Freq: Every day | OPHTHALMIC | Status: DC
  Administered 2020-11-14 – 2020-11-16 (×4): 1 [drp] via OPHTHALMIC
  Filled 2020-11-14: qty 5

## 2020-11-14 MED ORDER — GABAPENTIN 100 MG PO CAPS
100.0000 mg | ORAL_CAPSULE | Freq: Every day | ORAL | Status: DC
  Administered 2020-11-14: 100 mg via ORAL
  Filled 2020-11-14 (×3): qty 1

## 2020-11-14 MED ORDER — ACETAMINOPHEN 650 MG RE SUPP: 650.0000 mg | Freq: Four times a day (QID) | RECTAL | Status: DC | PRN

## 2020-11-14 NOTE — Progress Notes (Signed)
PROGRESS NOTE  Veronica Young  GXQ:[id number] DOB: [birth year] DOA: 11/13/2020 PCP: Katherina Mires, MD  Outpatient Specialists: Cardiology, Dr. Gwenlyn Found Brief Narrative: Veronica Young is an 86 y.o. female with a history of chronic HFpEF (TTE April 2021 w/LVEF 55-60%), peresistent AFib on eliquis, SSS s/p PPM, PAD with bilateral popliteal artery occlusion, stage IIIb CKD, HTN, HLD, hypothyroidism, GERD, depression, and congenital deafness who presented to the ED with a couple days of dyspnea, slightly increased leg swelling, despite taking lasix 20mg  po daily without interruption. On arrival she was hypertensive, afebrile, with hypoxia requiring supplemental oxygen. BNP elevated at 346, troponin normal x2, CXR demonstrated cardiomegaly, and subsequent motion-degraded CT chest revealed septal thickening in bilateral bases and patchy opacities concerning for pulmonary edema. Diuretic was ordered and the patient was admitted with acute CHF.  Assessment & Plan: Principal Problem:   Acute respiratory failure with hypoxia (HCC) Active Problems:   Hypothyroidism   PAD (peripheral artery disease) (HCC)   Cardiac pacemaker in situ   Persistent atrial fibrillation (HCC)   Acute on chronic diastolic CHF (congestive heart failure) (HCC)   CRI (chronic renal insufficiency), stage 3 (moderate) (HCC)  Acute hypoxic respiratory failure due to acute on chronic HFpEF: Based on exam, CT chest showing pulmonary edema. Continues to be hypoxic this AM. Pt on eliquis and d-dimer only mildly elevated when adjusted for age, so less suspicious for PE and opt to avoid contrast w/CTA. - Continue lasix 40mg  IV BID - Hold hydralazine for now with soft BP. - Strict I/O (none documented at time of encounter, though significant output in canister, daily weights (wt is stable ~130lbs.)  PAD: Pt of Dr. Kennon Holter, on conservative management for popliteal occlusions.  - Continue eliquis (reduced dose), statin  Stage IIIb CKD: SCr  improved with diuresis thus far.  - Continue monitoring Cr daily - Avoid nephrotoxins  Hypokalemia:  - Supplement BID with continued loop diuretic. Monitor in AM.   Persistent atrial fibrillation, SSS s/p PPM 2015 MDT ADDRL1 pacemaker implanted by Dr Lovena Le. - Continue eliquis. CM consulted for medication assistance as pt is in medicare donut hole and being charged $500/month. - Continue metoprolol.  - Pt not taking lisinopril.  Thrombocytopenia:  - Continue monitoring.  Depression:  - Continue SSRI  Glaucoma:  - Continue gtt's  Hypothyroidism:  - Continue synthroid  DVT prophylaxis: Eliquis Code Status: DNR Family Communication: Daughter at bedside this morning Disposition Plan:  Status is: Inpatient  Remains inpatient appropriate because:patient remains hypoxic (dropped to 83% on room air at rest this morning during encounter despite IV diuresis during observation period.  Dispo: The patient is from: Home              Anticipated d/c is to: Home              Anticipated d/c date is: 2 days              Patient currently is not medically stable to d/c.  Consultants:   None  Procedures:   None  Antimicrobials:  None   Subjective: Shortness of breath at rest is mild, worse with exertion, and has gradually improved since admission. Still not at baseline. No chest pain. Leg swelling in R > LLE is chronic and slightly improved from last night. She had a "Thanksgiving" with family prior to this, but can't think of any other causes for fluid retention.   Objective: Vitals:   11/14/20 0101 11/14/20 0130 11/14/20 0500 11/14/20 1051  BP: (!) 160/97  110/60 (!) 84/55  Pulse: 67  63 66  Resp: 19  18 20   Temp: (!) 97.5 F (36.4 C)  97.7 F (36.5 C) 97.7 F (36.5 C)  TempSrc: Oral  Oral Oral  SpO2: 97%  95% 96%  Weight:  59 kg    Height:  5\' 5"  (1.651 m)      Intake/Output Summary (Last 24 hours) at 11/14/2020 1056 Last data filed at 11/14/2020 1000 Gross per  24 hour  Intake 480 ml  Output 600 ml  Net -120 ml   Filed Weights   11/13/20 2027 11/14/20 0130  Weight: 59 kg 59 kg   Gen: Pleasant, elderly female in no distress Pulm: Non-labored but tachypneic with rate of 20/min breathing 3L O2 with bibasilar crackles.  CV: Irreg irreg. No murmur, rub, or gallop. + JVD with +HJR, pitting RLE > LLE edema, diminished pulses. GI: Abdomen soft, non-tender, non-distended, with normoactive bowel sounds. No organomegaly or masses felt. Ext: Warm, no deformities Skin: +LE varicosities. No other rashes, lesions or ulcers on visualized skin Neuro: Alert and oriented. Congenitally deaf. No new focal neurological deficits. Psych: Judgement and insight appear normal. Mood & affect appropriate.   Data Reviewed: I have personally reviewed following labs and imaging studies  CBC: Recent Labs  Lab 11/13/20 2200 11/14/20 0431  WBC 4.6 4.0  HGB 13.2 11.1*  HCT 40.3 34.9*  MCV 88.4 91.1  PLT 144* 195*   Basic Metabolic Panel: Recent Labs  Lab 11/13/20 2200 11/14/20 0431  NA 132* 136  K 3.7 3.0*  CL 99 103  CO2 24 24  GLUCOSE 119* 125*  BUN 33* 29*  CREATININE 1.31* 1.23*  CALCIUM 8.8* 8.4*  MG 2.2 2.2   GFR: Estimated Creatinine Clearance: 30.1 mL/min (A) (by C-G formula based on SCr of 1.23 mg/dL (H)). Liver Function Tests: No results for input(s): AST, ALT, ALKPHOS, BILITOT, PROT, ALBUMIN in the last 168 hours. No results for input(s): LIPASE, AMYLASE in the last 168 hours. No results for input(s): AMMONIA in the last 168 hours. Coagulation Profile: No results for input(s): INR, PROTIME in the last 168 hours. Cardiac Enzymes: No results for input(s): CKTOTAL, CKMB, CKMBINDEX, TROPONINI in the last 168 hours. BNP (last 3 results) No results for input(s): PROBNP in the last 8760 hours. HbA1C: No results for input(s): HGBA1C in the last 72 hours. CBG: No results for input(s): GLUCAP in the last 168 hours. Lipid Profile: No results for  input(s): CHOL, HDL, LDLCALC, TRIG, CHOLHDL, LDLDIRECT in the last 72 hours. Thyroid Function Tests: No results for input(s): TSH, T4TOTAL, FREET4, T3FREE, THYROIDAB in the last 72 hours. Anemia Panel: No results for input(s): VITAMINB12, FOLATE, FERRITIN, TIBC, IRON, RETICCTPCT in the last 72 hours. Urine analysis:    Component Value Date/Time   COLORURINE YELLOW 11/27/2012 1120   APPEARANCEUR CLOUDY (A) 11/27/2012 1120   LABSPEC 1.011 11/27/2012 1120   PHURINE 7.0 11/27/2012 1120   GLUCOSEU NEGATIVE 11/27/2012 1120   HGBUR NEGATIVE 11/27/2012 1120   BILIRUBINUR NEGATIVE 11/27/2012 1120   KETONESUR NEGATIVE 11/27/2012 1120   PROTEINUR NEGATIVE 11/27/2012 1120   UROBILINOGEN 0.2 11/27/2012 1120   NITRITE NEGATIVE 11/27/2012 1120   LEUKOCYTESUR NEGATIVE 11/27/2012 1120   Recent Results (from the past 240 hour(s))  Resp Panel by RT-PCR (Flu A&B, Covid) Nasopharyngeal Swab     Status: None   Collection Time: 11/14/20 12:13 AM   Specimen: Nasopharyngeal Swab; Nasopharyngeal(NP) swabs in vial transport medium  Result Value  Ref Range Status   SARS Coronavirus 2 by RT PCR NEGATIVE NEGATIVE Final    Comment: (NOTE) SARS-CoV-2 target nucleic acids are NOT DETECTED.  The SARS-CoV-2 RNA is generally detectable in upper respiratory specimens during the acute phase of infection. The lowest concentration of SARS-CoV-2 viral copies this assay can detect is 138 copies/mL. A negative result does not preclude SARS-Cov-2 infection and should not be used as the sole basis for treatment or other patient management decisions. A negative result may occur with  improper specimen collection/handling, submission of specimen other than nasopharyngeal swab, presence of viral mutation(s) within the areas targeted by this assay, and inadequate number of viral copies(<138 copies/mL). A negative result must be combined with clinical observations, patient history, and epidemiological information. The  expected result is Negative.  Fact Sheet for Patients:  EntrepreneurPulse.com.au  Fact Sheet for Healthcare Providers:  IncredibleEmployment.be  This test is no t yet approved or cleared by the [place] FDA and  has been authorized for detection and/or diagnosis of SARS-CoV-2 by FDA under an Emergency Use Authorization (EUA). This EUA will remain  in effect (meaning this test can be used) for the duration of the COVID-19 declaration under Section 564(b)(1) of the Act, 21 U.S.C.section 360bbb-3(b)(1), unless the authorization is terminated  or revoked sooner.       Influenza A by PCR NEGATIVE NEGATIVE Final   Influenza B by PCR NEGATIVE NEGATIVE Final    Comment: (NOTE) The Xpert Xpress SARS-CoV-2/FLU/RSV plus assay is intended as an aid in the diagnosis of influenza from Nasopharyngeal swab specimens and should not be used as a sole basis for treatment. Nasal washings and aspirates are unacceptable for Xpert Xpress SARS-CoV-2/FLU/RSV testing.  Fact Sheet for Patients: EntrepreneurPulse.com.au  Fact Sheet for Healthcare Providers: IncredibleEmployment.be  This test is not yet approved or cleared by the [place] FDA and has been authorized for detection and/or diagnosis of SARS-CoV-2 by FDA under an Emergency Use Authorization (EUA). This EUA will remain in effect (meaning this test can be used) for the duration of the COVID-19 declaration under Section 564(b)(1) of the Act, 21 U.S.C. section 360bbb-3(b)(1), unless the authorization is terminated or revoked.  Performed at [facility], [place] [address], [place], [place] [address]       Radiology Studies: CT CHEST WO CONTRAST  Result Date: 11/14/2020 CLINICAL DATA:  Increasing shortness of breath for 2 days with edema in the legs. Nondiagnostic radiographs. EXAM: CT CHEST WITHOUT CONTRAST TECHNIQUE: Multidetector CT imaging  of the chest was performed following the standard protocol without IV contrast. COMPARISON:  Chest radiograph 11/13/2020 FINDINGS: Cardiovascular: Cardiac enlargement. No pericardial effusions. Normal caliber thoracic aorta. Aortic calcification and coronary artery calcification. Mediastinum/Nodes: Mediastinal lymph nodes are prominent without pathologic enlargement, likely reactive. Esophagus is decompressed. Lungs/Pleura: Motion artifact limits examination. There appears to be some thickening of interlobular septa in the lung bases with patchy mosaic appearance to the lungs. This may represent mild edema. No focal consolidation. No pleural effusions. No pneumothorax. Airways are patent. Upper Abdomen: No acute abnormalities demonstrated in the visualized upper abdomen. Musculoskeletal: Degenerative changes in the spine. Heterogeneous sclerosis in the visualized bones suggesting metabolic disease, probably renal osteodystrophy. No focal destructive lesions. IMPRESSION: 1. Cardiac enlargement with probable mild interstitial edema. 2. Heterogeneous sclerosis in the visualized bones suggesting metabolic disease, probably renal osteodystrophy. 3. Aortic atherosclerosis. Aortic Atherosclerosis (ICD10-I70.0). Electronically Signed   By: Lucienne Capers M.D.   On: 11/14/2020 03:24   DG Chest Portable 1  View  Result Date: 11/13/2020 CLINICAL DATA:  Dyspnea EXAM: PORTABLE CHEST 1 VIEW COMPARISON:  04/05/2020 FINDINGS: Patient rotation limits the examination. Cardiac pacemaker. Probable mild cardiac enlargement. No vascular congestion, edema, or consolidation. No pleural effusions. No pneumothorax. IMPRESSION: Cardiac enlargement. Electronically Signed   By: Lucienne Capers M.D.   On: 11/13/2020 22:42    Scheduled Meds: . apixaban  2.5 mg Oral BID  . [START ON 11/15/2020] atorvastatin  80 mg Oral QHS  . atropine  1 drop Left Eye QHS  . brimonidine  1 drop Both Eyes Daily  . citalopram  20 mg Oral QHS  .  fluorometholone  1 drop Left Eye QID  . furosemide  40 mg Intravenous BID  . gabapentin  100 mg Oral Daily  . levothyroxine  112 mcg Oral Q0600  . metoprolol tartrate  25 mg Oral BID  . potassium chloride  40 mEq Oral BID   Continuous Infusions:   LOS: 0 days   Time spent: 35 minutes.  Patrecia Pour, MD [facility] [email or web address] 11/14/2020, 10:56 AM

## 2020-11-14 NOTE — Plan of Care (Signed)

## 2020-11-14 NOTE — Progress Notes (Signed)
Noted K of 3, informed hospitalist on call, Ms E. Ouma NP.

## 2020-11-14 NOTE — Plan of Care (Addendum)
-   Admitted from ED at 0100, Aox4 not in distress, pt has hearing impairment and requires a sign language interpreter, at this time the daughter serves as the interpreter, but wrote down the name and informed this RN on their preferred [place] interpreter, CN Pam informed. IPAD online interpreter is at the bedside. Assessment done, all needs attended, instructed to call for assistance at all times.  - due for CT of chest, taken down to CT scan and accompanied by the daughter as well.   Problem: Education: Goal: Ability to demonstrate management of disease process will improve Outcome: Progressing Goal: Ability to verbalize understanding of medication therapies will improve Outcome: Progressing Goal: Individualized Educational Video(s) Outcome: Progressing   Problem: Activity: Goal: Capacity to carry out activities will improve Outcome: Progressing   Problem: Cardiac: Goal: Ability to achieve and maintain adequate cardiopulmonary perfusion will improve Outcome: Progressing   Problem: Education: Goal: Knowledge of General Education information will improve Description: Including pain rating scale, medication(s)/side effects and non-pharmacologic comfort measures Outcome: Progressing   Problem: Health Behavior/Discharge Planning: Goal: Ability to manage health-related needs will improve Outcome: Progressing   Problem: Clinical Measurements: Goal: Ability to maintain clinical measurements within normal limits will improve Outcome: Progressing Goal: Will remain free from infection Outcome: Progressing Goal: Diagnostic test results will improve Outcome: Progressing Goal: Respiratory complications will improve Outcome: Progressing Goal: Cardiovascular complication will be avoided Outcome: Progressing   Problem: Activity: Goal: Risk for activity intolerance will decrease Outcome: Progressing   Problem: Nutrition: Goal: Adequate nutrition will be maintained Outcome:  Progressing   Problem: Coping: Goal: Level of anxiety will decrease Outcome: Progressing   Problem: Elimination: Goal: Will not experience complications related to bowel motility Outcome: Progressing Goal: Will not experience complications related to urinary retention Outcome: Progressing   Problem: Pain Managment: Goal: General experience of comfort will improve Outcome: Progressing   Problem: Safety: Goal: Ability to remain free from injury will improve Outcome: Progressing   Problem: Skin Integrity: Goal: Risk for impaired skin integrity will decrease Outcome: Progressing

## 2020-11-15 DIAGNOSIS — J9601 Acute respiratory failure with hypoxia: Secondary | ICD-10-CM | POA: Diagnosis not present

## 2020-11-15 MED ORDER — POTASSIUM CHLORIDE CRYS ER 20 MEQ PO TBCR
40.0000 meq | EXTENDED_RELEASE_TABLET | Freq: Two times a day (BID) | ORAL | Status: DC
  Administered 2020-11-15 – 2020-11-16 (×3): 40 meq via ORAL
  Filled 2020-11-15 (×3): qty 2

## 2020-11-15 MED ORDER — CITALOPRAM HYDROBROMIDE 20 MG PO TABS
20.0000 mg | ORAL_TABLET | Freq: Every day | ORAL | Status: DC
  Administered 2020-11-15 – 2020-11-16 (×2): 20 mg via ORAL
  Filled 2020-11-15 (×2): qty 1

## 2020-11-15 MED ORDER — GABAPENTIN 100 MG PO CAPS
100.0000 mg | ORAL_CAPSULE | ORAL | Status: DC
  Administered 2020-11-15: 100 mg via ORAL
  Filled 2020-11-15: qty 1

## 2020-11-15 NOTE — Evaluation (Signed)
Physical Therapy Evaluation Patient Details Name: Veronica Young MRN: [id number] DOB: [birth year] Today's Date: 11/15/2020   History of Present Illness  Veronica Young is an 86 y.o. female with a history of chronic HFpEF , peresistent AFib, s/p PPM, PAD with bilateral popliteal artery occlusion,  CKD, HTN, HLD, hypothyroidism, GERD, depression, and congenital deafness who presented to the ED 11/22/21with acute respiratory failure, increased leg swelling,  hypertensive, afebrile, BNP elevated , CXR demonstrated cardimegaly, CT chest revealed septal thickening in bilateral bases and patchy opacities concerning for pulmonary edema. Admitted for acute CHF.  Clinical Impression  The patient ambulated x120' using RW on 2 L [place], SPO2 97%, HR 96. Patient's daughter lives with her, patient independent PTA.[name] Patient can ambulate with staff on unit. Patient should progress to Dc home. Will require a saturation walk test. Pt admitted with above diagnosis.  Pt currently with functional limitations due to the deficits listed below (see PT Problem List). Pt will benefit from skilled PT to increase their independence and safety with mobility to allow discharge to the venue listed below.       Follow Up Recommendations No PT follow up    Equipment Recommendations  None recommended by PT    Recommendations for Other Services       Precautions / Restrictions Precautions Precautions: Fall Precaution Comments: monitor sats,      Mobility  Bed Mobility Overal bed mobility: Modified Independent                  Transfers Overall transfer level: Needs assistance Equipment used: Rolling walker (2 wheeled) Transfers: Sit to/from Stand Sit to Stand: Min guard         General transfer comment: steady assist to first stand,  Ambulation/Gait Ambulation/Gait assistance: Min guard;Min assist Gait Distance (Feet): 120 Feet Assistive device: Rolling walker (2 wheeled) Gait Pattern/deviations:  Step-through pattern Gait velocity: decr   General Gait Details: 1 balance loss after taking a few steps, then no further episodes.  Stairs            Wheelchair Mobility    Modified Rankin (Stroke Patients Only)       Balance                                             Pertinent Vitals/Pain Pain Assessment: No/denies pain    Home Living Family/patient expects to be discharged to:: Private residence Living Arrangements: Children Available Help at Discharge: Family;Available 24 hours/day Type of Home: House Home Access: Stairs to enter   [employer] of Steps: 1 Home Layout: One level Home Equipment: Environmental consultant - 2 wheels Additional Comments: daughter lives w/ pt, has another dtr who can stay    Prior Function Level of Independence: Independent         Comments: drives     Hand Dominance        Extremity/Trunk Assessment   Upper Extremity Assessment Upper Extremity Assessment: Overall WFL for tasks assessed    Lower Extremity Assessment Lower Extremity Assessment: Overall WFL for tasks assessed    Cervical / Trunk Assessment Cervical / Trunk Assessment: Normal  Communication   Communication:  (can read lips and hears a little, interpreter ipad works very well)  Cognition Arousal/Alertness: Awake/alert Behavior During Therapy: WFL for tasks assessed/performed Overall Cognitive Status: Within Functional Limits for tasks assessed  General Comments: interpreter stephen [phone]      General Comments      Exercises     Assessment/Plan    PT Assessment Patient needs continued PT services  PT Problem List Decreased strength;Decreased activity tolerance;Decreased mobility;Decreased balance       PT Treatment Interventions DME instruction;Gait training;Functional mobility training;Therapeutic activities;Therapeutic exercise;Patient/family education    PT Goals (Current  goals can be found in the Care Plan section)  Acute Rehab PT Goals Patient Stated Goal: go home PT Goal Formulation: With patient Time For Goal Achievement: 11/29/20 Potential to Achieve Goals: Good    Frequency Min 3X/week   Barriers to discharge        Co-evaluation               AM-PAC PT "6 Clicks" Mobility  Outcome Measure Help needed turning from your back to your side while in a flat bed without using bedrails?: None Help needed moving from lying on your back to sitting on the side of a flat bed without using bedrails?: None Help needed moving to and from a bed to a chair (including a wheelchair)?: A Little Help needed standing up from a chair using your arms (e.g., wheelchair or bedside chair)?: A Little Help needed to walk in hospital room?: A Little Help needed climbing 3-5 steps with a railing? : A Little 6 Click Score: 20    End of Session Equipment Utilized During Treatment: Gait belt Activity Tolerance: Patient tolerated treatment well Patient left: in bed;with call bell/phone within reach;with nursing/sitter in room Nurse Communication: Mobility status PT Visit Diagnosis: Difficulty in walking, not elsewhere classified (R26.2)    Time: 7673-4193 PT Time Calculation (min) (ACUTE ONLY): 31 min   Charges:   PT Evaluation $PT Eval Low Complexity: 1 Low PT Treatments $Gait Training: 8-22 mins        [place] Pager [phone] Office [phone]  Veronica Young 11/15/2020, 4:41 PM

## 2020-11-15 NOTE — Progress Notes (Signed)
PROGRESS NOTE    Veronica Young  YOV:[id number] DOB: 09-23-1933 DOA: 11/13/2020 PCP: Katherina Mires, MD    Brief Narrative:  86 year old female with history of chronic diastolic heart failure, persistent A. fib on Eliquis, sick sinus syndrome status post permanent pacemaker, peripheral artery disease with bilateral popliteal artery occlusion, CKD stage IIIb, hypertension, hyperlipidemia and hypothyroidism, congenital deafness presented to the ER with 2 days of dyspnea, leg swelling right more than left.  In the emergency room hemodynamically stable.  She was 86% on room air.  BNP was 346.  Troponin normal.  Chest x-ray with congestion.   Assessment & Plan:   Principal Problem:   Acute respiratory failure with hypoxia (HCC) Active Problems:   Hypothyroidism   PAD (peripheral artery disease) (HCC)   Cardiac pacemaker in situ   Persistent atrial fibrillation (HCC)   Acute on chronic diastolic CHF (congestive heart failure) (HCC)   CRI (chronic renal insufficiency), stage 3 (moderate) (HCC)  Acute on chronic diastolic heart failure with hypoxia: Some clinical improvement today.  86% on room air. Continue Lasix 40 mg IV twice daily along with aggressive potassium replacement. Known diastolic heart failure, recent echocardiogram is available with ejection fraction of 55 to 60%. Continue to mobilize.  Intake and output monitoring.   Peripheral arterial disease: Conservative management.  On Eliquis.  On a statin.  Stage IIIb chronic kidney disease: Creatinine remains a stable.  We will monitor.  Hypokalemia: Due to Lasix.  Replace aggressively.  Persistent A. fib: Status post pacemaker.  Rate controlled on metoprolol.  Therapeutic on Eliquis.  Hypothyroidism: On Synthroid.   DVT prophylaxis: apixaban (ELIQUIS) tablet 2.5 mg Start: 11/14/20 1000 apixaban (ELIQUIS) tablet 2.5 mg   Code Status: DNR Family Communication: Daughter at the bedside Disposition Plan: Status is:  Inpatient  Remains inpatient appropriate because:Inpatient level of care appropriate due to severity of illness   Dispo: The patient is from: Home              Anticipated d/c is to: Home              Anticipated d/c date is: 2 days              Patient currently is not medically stable to d/c.         Consultants:   None  Procedures:   None  Antimicrobials:   None   Subjective: Patient was seen and examined.  No overnight events.  She was looking forward to go home.  She has not mobilized or a stage without oxygen. Patient's daughter was at the bedside who helped to interpret for her sign language. Patient denies any chest pain or shortness of breath. Taken off the oxygen, saturations 86% on room air.  Objective: Vitals:   11/14/20 1915 11/14/20 2117 11/15/20 0455 11/15/20 1347  BP: 104/67 116/68 (!) 148/82 103/61  Pulse:  64 60 69  Resp:  19 19 (!) 22  Temp:  98.1 F (36.7 C) 97.6 F (36.4 C) 97.8 F (36.6 C)  TempSrc:  Oral Oral Oral  SpO2:  98% 99% 96%  Weight:   59.1 kg   Height:        Intake/Output Summary (Last 24 hours) at 11/15/2020 1520 Last data filed at 11/15/2020 1348 Gross per 24 hour  Intake 480 ml  Output 2700 ml  Net -2220 ml   Filed Weights   11/13/20 2027 11/14/20 0130 11/15/20 0455  Weight: 59 kg 59 kg 59.1 kg  Examination:  General exam: Appears calm and comfortable  Looks comfortable. Respiratory system: Clear to auscultation. Respiratory effort normal.  No added sounds. Cardiovascular system: S1 & S2 heard, RRR.  Pacemaker present left chest.   Gastrointestinal system: Abdomen is nondistended, soft and nontender. No organomegaly or masses felt. Normal bowel sounds heard. Central nervous system: Alert and oriented. No focal neurological deficits. Extremities:  Right leg trace edema.  Left leg without edema.    Data Reviewed: I have personally reviewed following labs and imaging studies  CBC: Recent Labs  Lab  11/13/20 2200 11/14/20 0431  WBC 4.6 4.0  HGB 13.2 11.1*  HCT 40.3 34.9*  MCV 88.4 91.1  PLT 144* 998*   Basic Metabolic Panel: Recent Labs  Lab 11/13/20 2200 11/14/20 0431  NA 132* 136  K 3.7 3.0*  CL 99 103  CO2 24 24  GLUCOSE 119* 125*  BUN 33* 29*  CREATININE 1.31* 1.23*  CALCIUM 8.8* 8.4*  MG 2.2 2.2   GFR: Estimated Creatinine Clearance: 30.1 mL/min (A) (by C-G formula based on SCr of 1.23 mg/dL (H)). Liver Function Tests: No results for input(s): AST, ALT, ALKPHOS, BILITOT, PROT, ALBUMIN in the last 168 hours. No results for input(s): LIPASE, AMYLASE in the last 168 hours. No results for input(s): AMMONIA in the last 168 hours. Coagulation Profile: No results for input(s): INR, PROTIME in the last 168 hours. Cardiac Enzymes: No results for input(s): CKTOTAL, CKMB, CKMBINDEX, TROPONINI in the last 168 hours. BNP (last 3 results) No results for input(s): PROBNP in the last 8760 hours. HbA1C: No results for input(s): HGBA1C in the last 72 hours. CBG: No results for input(s): GLUCAP in the last 168 hours. Lipid Profile: No results for input(s): CHOL, HDL, LDLCALC, TRIG, CHOLHDL, LDLDIRECT in the last 72 hours. Thyroid Function Tests: No results for input(s): TSH, T4TOTAL, FREET4, T3FREE, THYROIDAB in the last 72 hours. Anemia Panel: No results for input(s): VITAMINB12, FOLATE, FERRITIN, TIBC, IRON, RETICCTPCT in the last 72 hours. Sepsis Labs: No results for input(s): PROCALCITON, LATICACIDVEN in the last 168 hours.  Recent Results (from the past 240 hour(s))  Resp Panel by RT-PCR (Flu A&B, Covid) Nasopharyngeal Swab     Status: None   Collection Time: 11/14/20 12:13 AM   Specimen: Nasopharyngeal Swab; Nasopharyngeal(NP) swabs in vial transport medium  Result Value Ref Range Status   SARS Coronavirus 2 by RT PCR NEGATIVE NEGATIVE Final    Comment: (NOTE) SARS-CoV-2 target nucleic acids are NOT DETECTED.  The SARS-CoV-2 RNA is generally detectable in upper  respiratory specimens during the acute phase of infection. The lowest concentration of SARS-CoV-2 viral copies this assay can detect is 138 copies/mL. A negative result does not preclude SARS-Cov-2 infection and should not be used as the sole basis for treatment or other patient management decisions. A negative result may occur with  improper specimen collection/handling, submission of specimen other than nasopharyngeal swab, presence of viral mutation(s) within the areas targeted by this assay, and inadequate number of viral copies(<138 copies/mL). A negative result must be combined with clinical observations, patient history, and epidemiological information. The expected result is Negative.  Fact Sheet for Patients:  EntrepreneurPulse.com.au  Fact Sheet for Healthcare Providers:  IncredibleEmployment.be  This test is no t yet approved or cleared by the [place] FDA and  has been authorized for detection and/or diagnosis of SARS-CoV-2 by FDA under an Emergency Use Authorization (EUA). This EUA will remain  in effect (meaning this test can be used) for the  duration of the COVID-19 declaration under Section 564(b)(1) of the Act, 21 U.S.C.section 360bbb-3(b)(1), unless the authorization is terminated  or revoked sooner.       Influenza A by PCR NEGATIVE NEGATIVE Final   Influenza B by PCR NEGATIVE NEGATIVE Final    Comment: (NOTE) The Xpert Xpress SARS-CoV-2/FLU/RSV plus assay is intended as an aid in the diagnosis of influenza from Nasopharyngeal swab specimens and should not be used as a sole basis for treatment. Nasal washings and aspirates are unacceptable for Xpert Xpress SARS-CoV-2/FLU/RSV testing.  Fact Sheet for Patients: EntrepreneurPulse.com.au  Fact Sheet for Healthcare Providers: IncredibleEmployment.be  This test is not yet approved or cleared by the [place] FDA and has been  authorized for detection and/or diagnosis of SARS-CoV-2 by FDA under an Emergency Use Authorization (EUA). This EUA will remain in effect (meaning this test can be used) for the duration of the COVID-19 declaration under Section 564(b)(1) of the Act, 21 U.S.C. section 360bbb-3(b)(1), unless the authorization is terminated or revoked.  Performed at [facility], [place] [address] [place], [place] [address]          Radiology Studies: CT CHEST WO CONTRAST  Result Date: 11/14/2020 CLINICAL DATA:  Increasing shortness of breath for 2 days with edema in the legs. Nondiagnostic radiographs. EXAM: CT CHEST WITHOUT CONTRAST TECHNIQUE: Multidetector CT imaging of the chest was performed following the standard protocol without IV contrast. COMPARISON:  Chest radiograph 11/13/2020 FINDINGS: Cardiovascular: Cardiac enlargement. No pericardial effusions. Normal caliber thoracic aorta. Aortic calcification and coronary artery calcification. Mediastinum/Nodes: Mediastinal lymph nodes are prominent without pathologic enlargement, likely reactive. Esophagus is decompressed. Lungs/Pleura: Motion artifact limits examination. There appears to be some thickening of interlobular septa in the lung bases with patchy mosaic appearance to the lungs. This may represent mild edema. No focal consolidation. No pleural effusions. No pneumothorax. Airways are patent. Upper Abdomen: No acute abnormalities demonstrated in the visualized upper abdomen. Musculoskeletal: Degenerative changes in the spine. Heterogeneous sclerosis in the visualized bones suggesting metabolic disease, probably renal osteodystrophy. No focal destructive lesions. IMPRESSION: 1. Cardiac enlargement with probable mild interstitial edema. 2. Heterogeneous sclerosis in the visualized bones suggesting metabolic disease, probably renal osteodystrophy. 3. Aortic atherosclerosis. Aortic Atherosclerosis (ICD10-I70.0). Electronically Signed   By:  Lucienne Capers M.D.   On: 11/14/2020 03:24   DG Chest Portable 1 View  Result Date: 11/13/2020 CLINICAL DATA:  Dyspnea EXAM: PORTABLE CHEST 1 VIEW COMPARISON:  04/05/2020 FINDINGS: Patient rotation limits the examination. Cardiac pacemaker. Probable mild cardiac enlargement. No vascular congestion, edema, or consolidation. No pleural effusions. No pneumothorax. IMPRESSION: Cardiac enlargement. Electronically Signed   By: Lucienne Capers M.D.   On: 11/13/2020 22:42        Scheduled Meds: . apixaban  2.5 mg Oral BID  . atorvastatin  80 mg Oral QHS  . atropine  1 drop Left Eye QHS  . brimonidine  1 drop Both Eyes Daily  . citalopram  20 mg Oral Daily  . fluorometholone  1 drop Left Eye QID  . furosemide  40 mg Intravenous BID  . gabapentin  100 mg Oral Q24H  . levothyroxine  112 mcg Oral Q0600  . metoprolol tartrate  25 mg Oral BID  . potassium chloride  40 mEq Oral BID   Continuous Infusions:   LOS: 1 day    Time spent: 30 minutes    Barb Merino, MD [facility] Pager [phone]

## 2020-11-15 NOTE — Plan of Care (Signed)

## 2020-11-16 DIAGNOSIS — J9601 Acute respiratory failure with hypoxia: Secondary | ICD-10-CM | POA: Diagnosis not present

## 2020-11-16 LAB — BASIC METABOLIC PANEL
Anion gap: 7 (ref 5–15)
BUN: 26 mg/dL — ABNORMAL HIGH (ref 8–23)
CO2: 29 mmol/L (ref 22–32)
Calcium: 8.7 mg/dL — ABNORMAL LOW (ref 8.9–10.3)
Chloride: 101 mmol/L (ref 98–111)
Creatinine, Ser: 1.46 mg/dL — ABNORMAL HIGH (ref 0.44–1.00)
GFR, Estimated: 35 mL/min — ABNORMAL LOW (ref >60)
Glucose, Bld: 100 mg/dL — ABNORMAL HIGH (ref 70–99)
Potassium: 4.4 mmol/L (ref 3.5–5.1)
Sodium: 137 mmol/L (ref 135–145)

## 2020-11-16 LAB — MAGNESIUM: Magnesium: 2.1 mg/dL (ref 1.7–2.4)

## 2020-11-16 MED ORDER — POTASSIUM CHLORIDE CRYS ER 20 MEQ PO TBCR: 20.0000 meq | EXTENDED_RELEASE_TABLET | Freq: Every day | ORAL | 0 refills | Status: DC

## 2020-11-16 MED ORDER — FUROSEMIDE 40 MG PO TABS: 40.0000 mg | ORAL_TABLET | Freq: Every day | ORAL | 2 refills | Status: DC

## 2020-11-16 NOTE — Progress Notes (Signed)
Dc'd with oxygen

## 2020-11-16 NOTE — Progress Notes (Signed)
SATURATION QUALIFICATIONS: (This note is used to comply with regulatory documentation for home oxygen)  Patient Saturations on Room Air at Rest = 84%  Patient Saturations on Room Air while Ambulating = 80%  Patient Saturations on 3 Liters of oxygen while Ambulating = 91%  Please briefly explain why patient needs home oxygen: maintain oxygen saturation

## 2020-11-16 NOTE — TOC Progression Note (Addendum)
Transition of Care [facility]) - Progression Note    Patient Details  Name: Veronica Young MRN: [id number] Date of Birth: 1933-01-15  Transition of Care [facility]) CM/SW Contact  Ross Ludwig, [place] Phone Number: 11/16/2020, 10:16 AM  Clinical Narrative:     CSW was informed that patient will need oxygen.  CSW contacted [employer], [employer], and [employer], awaiting for a call back about getting oxygen set up for patient.  11:00am CSW received phone call back from [employer] they requested that orders for oxygen be faxed to them.  CSW faxed requested orders and qualification note.  [employer] to deliver oxygen before she leaves.       Expected Discharge Plan and Services  Patient plans to discharge back home with oxygen.                                                Social Determinants of Health (SDOH) Interventions    Readmission Risk Interventions No flowsheet data found.

## 2020-11-16 NOTE — Discharge Summary (Signed)
Physician Discharge Summary  Veronica Young ERX:[id number] DOB: [birth year] DOA: 11/13/2020  PCP: Katherina Mires, MD  Admit date: 11/13/2020 Discharge date: 11/16/2020  Admitted From: Home Disposition: Home  Recommendations for Outpatient Follow-up:  1. Follow up with PCP in 1-2 weeks 2. Please obtain BMP/CBC in one week   Home Health: Not applicable Equipment/Devices: Oxygen  Discharge Condition: Stable CODE STATUS: DNR Diet recommendation: Low-salt diet  Discharge summary: 86 year old female with history of chronic diastolic heart failure, persistent A. fib on Eliquis, sick sinus syndrome status post pacemaker, peripheral artery disease with bilateral popliteal artery occlusion, stage IIIb chronic kidney disease, hypertension hyperlipidemia and congenital deafness presented to the ER with 2 days of dyspnea, leg swelling right more than left.  In the emergency room hemodynamically stable.  86% on room air.  BNP was 346.  Troponins normal.  Chest x-ray with vascular congestion.  Patient was admitted to the hospital and treated for acute on chronic diastolic heart failure exacerbation.  Acute on chronic diastolic heart failure exacerbation with hypoxia.  Multifactorial.  Recent echocardiogram with ejection fraction 55-60%.  Probably decompensated with dietary indiscretions.  Treated with IV Lasix 40 mg twice a day with good clinical improvement.  Patient has adequately stabilized now. On mobility, she was hypoxemic even after improvement of fluid balance. -Discharged home on Lasix 40 mg daily, increased from 20 mg daily. -Patient is already on beta-blocker that she will continue. -Patient is on potassium supplements that she will continue. -She was discharged home with supplemental oxygen, she will mostly needed on exertion. -Her heart rate is controlled, paced rhythm and therapeutic on Eliquis. -Her creatinine fluctuates however mostly remained stable.  Today it is 1.46.  Will need  repeat within 1 to 2 weeks to ensure stabilization.  Patient is adequately stabilized to go home today.  She will follow up with her cardiology.  May need a repeat echocardiogram to evaluate ejection fraction on follow-up.   Discharge Diagnoses:  Principal Problem:   Acute respiratory failure with hypoxia (HCC) Active Problems:   Hypothyroidism   PAD (peripheral artery disease) (HCC)   Cardiac pacemaker in situ   Persistent atrial fibrillation (HCC)   Acute on chronic diastolic CHF (congestive heart failure) (HCC)   CRI (chronic renal insufficiency), stage 3 (moderate) (HCC)    Discharge Instructions  Discharge Instructions    (HEART FAILURE PATIENTS) Call MD:  Anytime you have any of the following symptoms: 1) 3 pound weight gain in 24 hours or 5 pounds in 1 week 2) shortness of breath, with or without a dry hacking cough 3) swelling in the hands, feet or stomach 4) if you have to sleep on extra pillows at night in order to breathe.   Complete by: As directed    Diet - low sodium heart healthy   Complete by: As directed    Discharge instructions   Complete by: As directed    Will need repeat BMP in one week. Please call and schedule follow up with cardiology   Increase activity slowly   Complete by: As directed      Allergies as of 11/16/2020      Reactions   Amlodipine Nausea Only      Benicar [olmesartan Medoxomil] Nausea Only      Medication List    TAKE these medications   acetaminophen 500 MG tablet Commonly known as: TYLENOL Take 1,000 mg by mouth every 6 (six) hours as needed for moderate pain.   apixaban 2.5 MG Tabs  tablet Commonly known as: Eliquis Take 1 tablet (2.5 mg total) by mouth 2 (two) times daily.   atorvastatin 80 MG tablet Commonly known as: LIPITOR Take 80 mg by mouth at bedtime.   atropine 1 % ophthalmic solution Place 1 drop into the left eye at bedtime.   brimonidine 0.2 % ophthalmic solution Commonly known as: ALPHAGAN Place 1 drop  into both eyes daily.   calcium-vitamin D 500-200 MG-UNIT tablet Commonly known as: OSCAL WITH D Take 1 tablet by mouth daily with breakfast.   cholecalciferol 1000 units tablet Commonly known as: VITAMIN D Take 1,000 Units by mouth daily.   citalopram 20 MG tablet Commonly known as: CELEXA Take 20 mg by mouth at bedtime.   dorzolamide-timolol 22.3-6.8 MG/ML ophthalmic solution Commonly known as: COSOPT Place 1 drop into both eyes 2 (two) times daily.   fluorometholone 0.1 % ophthalmic suspension Commonly known as: FML Place 1 drop into the left eye 4 (four) times daily.   furosemide 40 MG tablet Commonly known as: LASIX Take 1 tablet (40 mg total) by mouth daily. What changed:   medication strength  how much to take   gabapentin 100 MG capsule Commonly known as: NEURONTIN Take 100 mg by mouth daily.   hydrALAZINE 50 MG tablet Commonly known as: APRESOLINE Take 50 mg by mouth daily.   latanoprost 0.005 % ophthalmic solution Commonly known as: XALATAN Place 1 drop into both eyes in the morning and at bedtime.   levothyroxine 112 MCG tablet Commonly known as: SYNTHROID Take 112 mcg by mouth daily before breakfast.   metoprolol tartrate 25 MG tablet Commonly known as: LOPRESSOR Take 1 tablet (25 mg total) by mouth 2 (two) times daily.   potassium chloride SA 20 MEQ tablet Commonly known as: KLOR-CON Take 1 tablet (20 mEq total) by mouth daily.   Rhopressa 0.02 % Soln Generic drug: Netarsudil Dimesylate Place 1 drop into the left eye at bedtime.            Durable Medical Equipment  (From admission, onward)         Start     Ordered   11/16/20 0949  For home use only DME oxygen  Once       Comments: Patient Saturations on Room Air at Rest = 84%  Patient Saturations on Room Air while Ambulating = 80%  Patient Saturations on 3 Liters of oxygen while Ambulating = 91%  Question Answer Comment  Length of Need Lifetime   Mode or (Route) Nasal  cannula   Liters per Minute 3   Oxygen conserving device Yes   Oxygen delivery system Gas      11/16/20 0948          Follow-up Information    Katherina Mires, MD Follow up in 2 week(s).   Specialty: Family Medicine Contact information: [place] [address] [phone]        [name], Peter M, MD .   Specialty: Cardiology Contact information: [address] [place] [address] [phone]              Allergies  Allergen Reactions  . Amlodipine Nausea Only       . Benicar [Olmesartan Medoxomil] Nausea Only    Consultations:  None   Procedures/Studies: CT CHEST WO CONTRAST  Result Date: 11/14/2020 CLINICAL DATA:  Increasing shortness of breath for 2 days with edema in the legs. Nondiagnostic radiographs. EXAM: CT CHEST WITHOUT CONTRAST TECHNIQUE: Multidetector  CT imaging of the chest was performed following the standard protocol without IV contrast. COMPARISON:  Chest radiograph 11/13/2020 FINDINGS: Cardiovascular: Cardiac enlargement. No pericardial effusions. Normal caliber thoracic aorta. Aortic calcification and coronary artery calcification. Mediastinum/Nodes: Mediastinal lymph nodes are prominent without pathologic enlargement, likely reactive. Esophagus is decompressed. Lungs/Pleura: Motion artifact limits examination. There appears to be some thickening of interlobular septa in the lung bases with patchy mosaic appearance to the lungs. This may represent mild edema. No focal consolidation. No pleural effusions. No pneumothorax. Airways are patent. Upper Abdomen: No acute abnormalities demonstrated in the visualized upper abdomen. Musculoskeletal: Degenerative changes in the spine. Heterogeneous sclerosis in the visualized bones suggesting metabolic disease, probably renal osteodystrophy. No focal destructive lesions. IMPRESSION: 1. Cardiac enlargement with probable mild interstitial edema. 2. Heterogeneous  sclerosis in the visualized bones suggesting metabolic disease, probably renal osteodystrophy. 3. Aortic atherosclerosis. Aortic Atherosclerosis (ICD10-I70.0). Electronically Signed   By: Lucienne Capers M.D.   On: 11/14/2020 03:24   DG Chest Portable 1 View  Result Date: 11/13/2020 CLINICAL DATA:  Dyspnea EXAM: PORTABLE CHEST 1 VIEW COMPARISON:  04/05/2020 FINDINGS: Patient rotation limits the examination. Cardiac pacemaker. Probable mild cardiac enlargement. No vascular congestion, edema, or consolidation. No pleural effusions. No pneumothorax. IMPRESSION: Cardiac enlargement. Electronically Signed   By: Lucienne Capers M.D.   On: 11/13/2020 22:42   (Echo, Carotid, EGD, Colonoscopy, ERCP)    Subjective: Patient seen and examined.  No overnight events.  Eager to go home.  She ambulated in the hallway, however oxygen dropped.  She had minimal shortness of breath.   Discharge Exam: Vitals:   11/15/20 2116 11/16/20 0533  BP: 117/72 126/78  Pulse: 68 65  Resp: 18 18  Temp: 98.1 F (36.7 C) 98.8 F (37.1 C)  SpO2: 94% 97%   Vitals:   11/15/20 1347 11/15/20 2116 11/16/20 0500 11/16/20 0533  BP: 103/61 117/72  126/78  Pulse: 69 68  65  Resp: (!) 22 18  18   Temp: 97.8 F (36.6 C) 98.1 F (36.7 C)  98.8 F (37.1 C)  TempSrc: Oral Oral  Oral  SpO2: 96% 94%  97%  Weight:   55.8 kg   Height:        General: Pt is alert, awake, not in acute distress Cardiovascular: Regular rhythm.  No murmur.  Pacemaker present. Respiratory: CTA bilaterally, no wheezing, no rhonchi, no added sounds. Abdominal: Soft, NT, ND, bowel sounds + Extremities: No pedal edema.    The results of significant diagnostics from this hospitalization (including imaging, microbiology, ancillary and laboratory) are listed below for reference.     Microbiology: Recent Results (from the past 240 hour(s))  Resp Panel by RT-PCR (Flu A&B, Covid) Nasopharyngeal Swab     Status: None   Collection Time: 11/14/20  12:13 AM   Specimen: Nasopharyngeal Swab; Nasopharyngeal(NP) swabs in vial transport medium  Result Value Ref Range Status   SARS Coronavirus 2 by RT PCR NEGATIVE NEGATIVE Final    Comment: (NOTE) SARS-CoV-2 target nucleic acids are NOT DETECTED.  The SARS-CoV-2 RNA is generally detectable in upper respiratory specimens during the acute phase of infection. The lowest concentration of SARS-CoV-2 viral copies this assay can detect is 138 copies/mL. A negative result does not preclude SARS-Cov-2 infection and should not be used as the sole basis for treatment or other patient management decisions. A negative result may occur with  improper specimen collection/handling, submission of specimen other than nasopharyngeal swab, presence of viral mutation(s) within the areas targeted by  this assay, and inadequate number of viral copies(<138 copies/mL). A negative result must be combined with clinical observations, patient history, and epidemiological information. The expected result is Negative.  Fact Sheet for Patients:  EntrepreneurPulse.com.au  Fact Sheet for Healthcare Providers:  IncredibleEmployment.be  This test is no t yet approved or cleared by the [place] FDA and  has been authorized for detection and/or diagnosis of SARS-CoV-2 by FDA under an Emergency Use Authorization (EUA). This EUA will remain  in effect (meaning this test can be used) for the duration of the COVID-19 declaration under Section 564(b)(1) of the Act, 21 U.S.C.section 360bbb-3(b)(1), unless the authorization is terminated  or revoked sooner.       Influenza A by PCR NEGATIVE NEGATIVE Final   Influenza B by PCR NEGATIVE NEGATIVE Final    Comment: (NOTE) The Xpert Xpress SARS-CoV-2/FLU/RSV plus assay is intended as an aid in the diagnosis of influenza from Nasopharyngeal swab specimens and should not be used as a sole basis for treatment. Nasal washings and aspirates  are unacceptable for Xpert Xpress SARS-CoV-2/FLU/RSV testing.  Fact Sheet for Patients: EntrepreneurPulse.com.au  Fact Sheet for Healthcare Providers: IncredibleEmployment.be  This test is not yet approved or cleared by the [place] FDA and has been authorized for detection and/or diagnosis of SARS-CoV-2 by FDA under an Emergency Use Authorization (EUA). This EUA will remain in effect (meaning this test can be used) for the duration of the COVID-19 declaration under Section 564(b)(1) of the Act, 21 U.S.C. section 360bbb-3(b)(1), unless the authorization is terminated or revoked.  Performed at [facility], [place] [address], [place], [place] [address]      Labs: BNP (last 3 results) Recent Labs    04/02/20 1651 11/13/20 2200  BNP 330.0* 938.1*   Basic Metabolic Panel: Recent Labs  Lab 11/13/20 2200 11/14/20 0431 11/16/20 0429  NA 132* 136 137  K 3.7 3.0* 4.4  CL 99 103 101  CO2 24 24 29   GLUCOSE 119* 125* 100*  BUN 33* 29* 26*  CREATININE 1.31* 1.23* 1.46*  CALCIUM 8.8* 8.4* 8.7*  MG 2.2 2.2 2.1   Liver Function Tests: No results for input(s): AST, ALT, ALKPHOS, BILITOT, PROT, ALBUMIN in the last 168 hours. No results for input(s): LIPASE, AMYLASE in the last 168 hours. No results for input(s): AMMONIA in the last 168 hours. CBC: Recent Labs  Lab 11/13/20 2200 11/14/20 0431  WBC 4.6 4.0  HGB 13.2 11.1*  HCT 40.3 34.9*  MCV 88.4 91.1  PLT 144* 121*   Cardiac Enzymes: No results for input(s): CKTOTAL, CKMB, CKMBINDEX, TROPONINI in the last 168 hours. BNP: Invalid input(s): POCBNP CBG: No results for input(s): GLUCAP in the last 168 hours. D-Dimer Recent Labs    11/13/20 2251  DDIMER 1.09*   Hgb A1c No results for input(s): HGBA1C in the last 72 hours. Lipid Profile No results for input(s): CHOL, HDL, LDLCALC, TRIG, CHOLHDL, LDLDIRECT in the last 72 hours. Thyroid function studies No  results for input(s): TSH, T4TOTAL, T3FREE, THYROIDAB in the last 72 hours.  Invalid input(s): FREET3 Anemia work up No results for input(s): VITAMINB12, FOLATE, FERRITIN, TIBC, IRON, RETICCTPCT in the last 72 hours. Urinalysis    Component Value Date/Time   COLORURINE YELLOW 11/27/2012 1120   APPEARANCEUR CLOUDY (A) 11/27/2012 1120   LABSPEC 1.011 11/27/2012 1120   PHURINE 7.0 11/27/2012 1120   GLUCOSEU NEGATIVE 11/27/2012 1120   HGBUR NEGATIVE 11/27/2012 1120   BILIRUBINUR NEGATIVE 11/27/2012 1120   KETONESUR NEGATIVE  11/27/2012 1120   PROTEINUR NEGATIVE 11/27/2012 1120   UROBILINOGEN 0.2 11/27/2012 1120   NITRITE NEGATIVE 11/27/2012 1120   LEUKOCYTESUR NEGATIVE 11/27/2012 1120   Sepsis Labs Invalid input(s): PROCALCITONIN,  WBC,  LACTICIDVEN Microbiology Recent Results (from the past 240 hour(s))  Resp Panel by RT-PCR (Flu A&B, Covid) Nasopharyngeal Swab     Status: None   Collection Time: 11/14/20 12:13 AM   Specimen: Nasopharyngeal Swab; Nasopharyngeal(NP) swabs in vial transport medium  Result Value Ref Range Status   SARS Coronavirus 2 by RT PCR NEGATIVE NEGATIVE Final    Comment: (NOTE) SARS-CoV-2 target nucleic acids are NOT DETECTED.  The SARS-CoV-2 RNA is generally detectable in upper respiratory specimens during the acute phase of infection. The lowest concentration of SARS-CoV-2 viral copies this assay can detect is 138 copies/mL. A negative result does not preclude SARS-Cov-2 infection and should not be used as the sole basis for treatment or other patient management decisions. A negative result may occur with  improper specimen collection/handling, submission of specimen other than nasopharyngeal swab, presence of viral mutation(s) within the areas targeted by this assay, and inadequate number of viral copies(<138 copies/mL). A negative result must be combined with clinical observations, patient history, and epidemiological information. The expected result  is Negative.  Fact Sheet for Patients:  EntrepreneurPulse.com.au  Fact Sheet for Healthcare Providers:  IncredibleEmployment.be  This test is no t yet approved or cleared by the [place] FDA and  has been authorized for detection and/or diagnosis of SARS-CoV-2 by FDA under an Emergency Use Authorization (EUA). This EUA will remain  in effect (meaning this test can be used) for the duration of the COVID-19 declaration under Section 564(b)(1) of the Act, 21 U.S.C.section 360bbb-3(b)(1), unless the authorization is terminated  or revoked sooner.       Influenza A by PCR NEGATIVE NEGATIVE Final   Influenza B by PCR NEGATIVE NEGATIVE Final    Comment: (NOTE) The Xpert Xpress SARS-CoV-2/FLU/RSV plus assay is intended as an aid in the diagnosis of influenza from Nasopharyngeal swab specimens and should not be used as a sole basis for treatment. Nasal washings and aspirates are unacceptable for Xpert Xpress SARS-CoV-2/FLU/RSV testing.  Fact Sheet for Patients: EntrepreneurPulse.com.au  Fact Sheet for Healthcare Providers: IncredibleEmployment.be  This test is not yet approved or cleared by the [place] FDA and has been authorized for detection and/or diagnosis of SARS-CoV-2 by FDA under an Emergency Use Authorization (EUA). This EUA will remain in effect (meaning this test can be used) for the duration of the COVID-19 declaration under Section 564(b)(1) of the Act, 21 U.S.C. section 360bbb-3(b)(1), unless the authorization is terminated or revoked.  Performed at [facility], [place] [address] [place], [place] [address]      Time coordinating discharge: 35 minutes  SIGNED:   Barb Merino, MD  [facility] 11/16/2020, 1:02 PM

## 2020-11-20 ENCOUNTER — Telehealth: Payer: Self-pay | Admitting: Cardiology

## 2020-11-20 NOTE — Telephone Encounter (Signed)
New Message  Pt c/o medication issue:  1. Name of Medication: furosemide (LASIX) 40 MG tablet  2. How are you currently taking this medication (dosage and times per day)? 40mg  daily   3. Are you having a reaction (difficulty breathing--STAT)? No   4. What is your medication issue? Pts daughter is calling and says patient was prescribed to take 40mg  lasix for 3 days but she cant find it in the dischargeto verify. She would like for a nurse to call to verify   Please call

## 2020-11-20 NOTE — Telephone Encounter (Signed)
Spoke to daughter-advised per d/c instructions patient is suppose to take furosemide 40mg  daily. Patient has scheduled follow up with Dr. [name] 12/27, wondering if patient can been seen sooner.    Recent admission for CHF exacerbation-appt scheduled 12/3 with Dr. [name] for follow up.   Daughter aware.

## 2020-11-21 ENCOUNTER — Encounter: Payer: Medicare HMO | Admitting: Student

## 2020-11-24 ENCOUNTER — Ambulatory Visit: Payer: Medicare HMO | Admitting: Cardiology

## 2020-12-17 NOTE — Progress Notes (Signed)
Cardiology Office Note:    Date:  12/18/2020   ID:  Veronica Young, DOB 16-Sep-1933, MRN [id number]  PCP:  Veronica Mires, MD  Cardiologist:  Kritika Stukes [name], MD  Electrophysiologist:  Cristopher Peru, MD PAD: Dr. Alvester Chou  Referring MD: Veronica Mires, MD   Chief Complaint  Patient presents with  . Congestive Heart Failure    History of Present Illness:    Veronica Young is a 86 y.o. female with a hx of HTN, HLD, DM 2, SSS s/p PPM since August 2015, PAF on Eliquis, PAD with known occluded popliteal artery and a diastolic heart failure. She is deaf and sign language interpreter is present.  She was admitted in June 2018 with chest pain, this occurred in the setting of atrial tachycardia with heart rate of 150.  Thyroid level and electrolyte were normal.  Troponin was negative.  Hemoglobin was 8.6.  Echocardiogram obtained in August 2018 showed EF 65 to 70%, grade 1 DD, moderate TR and MR, PA peak pressure 51 mmHg.  I increased her hydralazine in 2018.    She has known popliteal occlusion on her previous lower extremity ultrasound and has been seen by Dr. Gwenlyn Found.  She is  followed by Dr. Lovena Le for her pacemaker.  In April 2021 she was admitted for acute on chronic diastolic heart failure.  Echocardiogram obtained on 04/03/2020 showed EF 55 to 60%, elevated RVSP 64.3 mmHg, mild MR.  She underwent diuresis and was eventually discharged on 20 mg daily of Lasix.  She was also noted to be in persistent atrial fibrillation.  She is rate controlled on metoprolol and Eliquis.    She was readmitted in November with acute CHF. Felt to be related to dietary indiscretion. Was diuresed with IV lasix. DC on lasix 20 mg daily. Creatinine was 1.46. Last device check in October was normal with persistent Afib.   On follow up she states she is doing well. No dyspnea or increased edema. Weight is stable. She is afraid her weight is too low. Is restricting salt intake. On lasix 40 mg daily. No palpitations, dizziness,  chest pain. Is no longer on oxygen.      Past Medical History:  Diagnosis Date  . Carotid bruit   . CHF (congestive heart failure) ([place])   . Deafness   . Diabetes mellitus    type 2  . Diastolic heart failure (HCC)    EF is 60%  . GERD (gastroesophageal reflux disease)   . Glaucoma   . Hypercholesterolemia   . Hypertension   . Hypothyroidism   . Normal cardiac stress test January 2014  . PAD (peripheral artery disease) ([place])    a. known bilateral popliteal occlusions with conservative management favored.  [name] PAT (paroxysmal atrial tachycardia) ([place])   . Sick sinus syndrome [facility])    a. s/p PPM placement 07/2014    Past Surgical History:  Procedure Laterality Date  . cholycystectomy    . left breast cyst removed    . PACEMAKER INSERTION  07-26-2014   MDT ADDRL1 pacemaker implanted by Dr Lovena Le for SSS  . PERMANENT PACEMAKER INSERTION N/A 07/26/2014   Procedure: PERMANENT PACEMAKER INSERTION;  Surgeon: Evans Lance, MD;  Location: [facility] CATH LAB;  Service: Cardiovascular;  Laterality: N/A;  . TOTAL KNEE ARTHROPLASTY     right    Current Medications: Current Meds  Medication Sig  . acetaminophen (TYLENOL) 500 MG tablet Take 1,000 mg by mouth every 6 (six) hours as needed for  moderate pain.  [name] apixaban (ELIQUIS) 2.5 MG TABS tablet Take 1 tablet (2.5 mg total) by mouth 2 (two) times daily.  [name] atorvastatin (LIPITOR) 80 MG tablet Take 80 mg by mouth at bedtime.   [name] atropine 1 % ophthalmic solution Place 1 drop into the left eye at bedtime.   . brimonidine (ALPHAGAN) 0.2 % ophthalmic solution Place 1 drop into both eyes daily.  . calcium-vitamin D (OSCAL WITH D) 500-200 MG-UNIT per tablet Take 1 tablet by mouth daily with breakfast.  . cholecalciferol (VITAMIN D) 1000 UNITS tablet Take 1,000 Units by mouth daily.  . citalopram (CELEXA) 20 MG tablet Take 20 mg by mouth at bedtime.   . dorzolamide-timolol (COSOPT) 22.3-6.8 MG/ML ophthalmic solution Place 1 drop into both eyes 2  (two) times daily.   . fluorometholone (FML) 0.1 % ophthalmic suspension Place 1 drop into the left eye 4 (four) times daily.  . furosemide (LASIX) 40 MG tablet Take 1 tablet (40 mg total) by mouth daily.  [name] gabapentin (NEURONTIN) 100 MG capsule Take 100 mg by mouth daily.   . hydrALAZINE (APRESOLINE) 50 MG tablet Take 50 mg by mouth daily.   [name] latanoprost (XALATAN) 0.005 % ophthalmic solution Place 1 drop into both eyes in the morning and at bedtime.   [name] levothyroxine (SYNTHROID, LEVOTHROID) 112 MCG tablet Take 112 mcg by mouth daily before breakfast.   . metoprolol tartrate (LOPRESSOR) 25 MG tablet Take 1 tablet (25 mg total) by mouth 2 (two) times daily.  . RHOPRESSA 0.02 % SOLN Place 1 drop into the left eye at bedtime.     Allergies:   Amlodipine and Benicar [olmesartan medoxomil]   Social History   Socioeconomic History  . Marital status: Widowed    Spouse name: Not on file  . Number of children: 4  . Years of education: Not on file  . Highest education level: Not on file  Occupational History  . Not on file  Tobacco Use  . Smoking status: Never Smoker  . Smokeless tobacco: Never Used  Substance and Sexual Activity  . Alcohol use: No  . Drug use: No  . Sexual activity: Never  Other Topics Concern  . Not on file  Social History Narrative  . Not on file   Social Determinants of Health   Financial Resource Strain: Not on file  Food Insecurity: Not on file  Transportation Needs: Not on file  Physical Activity: Not on file  Stress: Not on file  Social Connections: Not on file     Family History: The patient's family history includes Heart disease in her brother, mother, and sister; Liver cancer in her father; Stroke in her sister.  ROS:   Please see the history of present illness.     All other systems reviewed and are negative.  EKGs/Labs/Other Studies Reviewed:    The following studies were reviewed today:  Echo 04/03/2020 1. Left ventricular ejection  fraction, by estimation, is 55 to 60%. The  left ventricle has normal function. The left ventricle has no regional  wall motion abnormalities. There is mild concentric left ventricular  hypertrophy. Left ventricular diastolic  parameters are indeterminate.  2. Right ventricular systolic function is normal. The right ventricular  size is normal. There is severely elevated pulmonary artery systolic  pressure. The estimated right ventricular systolic pressure is 97.0 mmHg.  3. Right atrial size was mildly dilated.  4. The mitral valve is normal in structure. Mild mitral valve  regurgitation. No evidence of  mitral stenosis.  5. The aortic valve is tricuspid. Aortic valve regurgitation is trivial.  No aortic stenosis is present.  6. The inferior vena cava is dilated in size with <50% respiratory  variability, suggesting right atrial pressure of 15 mmHg.   EKG:  EKG is not ordered today.    Recent Labs: 04/03/2020: ALT 28; TSH 5.569 11/13/2020: B Natriuretic Peptide 346.4 11/14/2020: Hemoglobin 11.1; Platelets 121 11/16/2020: BUN 26; Creatinine, Ser 1.46; Magnesium 2.1; Potassium 4.4; Sodium 137  Recent Lipid Panel    Component Value Date/Time   CHOL 141 04/04/2020 0354   TRIG 79 04/04/2020 0354   HDL 33 (L) 04/04/2020 0354   CHOLHDL 4.3 04/04/2020 0354   VLDL 16 04/04/2020 0354   LDLCALC 92 04/04/2020 0354    Physical Exam:    VS:  BP 137/69   Pulse 87   Temp (!) 97 F (36.1 C)   Ht 5\' 5"  (1.651 m)   Wt 128 lb 6.4 oz (58.2 kg)   SpO2 96%   BMI 21.37 kg/m     Wt Readings from Last 3 Encounters:  12/18/20 128 lb 6.4 oz (58.2 kg)  11/16/20 123 lb 1.6 oz (55.8 kg)  06/20/20 130 lb 12.8 oz (59.3 kg)     GEN:  Well nourished, well developed in no acute distress HEENT: Normal NECK: No JVD; No carotid bruits LYMPHATICS: No lymphadenopathy CARDIAC: Irregularly irregular, no murmurs, rubs, gallops RESPIRATORY:  Clear to auscultation without rales, wheezing or rhonchi   ABDOMEN: Soft, non-tender, non-distended MUSCULOSKELETAL:  No edema; No deformity  SKIN: Warm and dry NEUROLOGIC:  Alert and oriented x 3 PSYCHIATRIC:  Normal affect   ASSESSMENT:    1. Sinus brady-tachy syndrome ([place])   2. Chronic diastolic CHF (congestive heart failure) ([place])   3. Longstanding persistent atrial fibrillation (HCC)   4. Persistent atrial fibrillation ([place])   5. Hyperlipidemia LDL goal <70   6. Chronic anticoagulation    PLAN:    In order of problems listed above:  1. Persistent atrial fibrillation: Rate controlled on metoprolol.  Continue Eliquis.    2. Hypertension: Blood pressure is well controlled.  3. Hyperlipidemia: On Lipitor  4. DM2: Managed by primary care provider  5. Sick sinus syndrome s/p pacemaker: Managed by Dr. Lovena Le  6. PAD: No claudication symptoms.  7. Chronic diastolic heart failure:  admitted in November for acute on chronic diastolic heart failure and underwent diuresis.  Since discharge, she has done well. Encouraged her to stay on current lasix dose. Follow up in 3-4 months.   Medication Adjustments/Labs and Tests Ordered: Current medicines are reviewed at length with the patient today.  Concerns regarding medicines are outlined above.  No orders of the defined types were placed in this encounter.  No orders of the defined types were placed in this encounter.   Patient Instructions  Continue your current therapy  Stay on lasix 40 mg daily  Restrict salt intake.  You may get your Covid booster now.  Follow up in 3-4 months      Signed, Kriss Perleberg [name], MD  12/18/2020 3:21 PM    [facility]

## 2020-12-18 ENCOUNTER — Encounter: Payer: Self-pay | Admitting: Cardiology

## 2020-12-18 ENCOUNTER — Other Ambulatory Visit: Payer: Self-pay

## 2020-12-18 ENCOUNTER — Ambulatory Visit (INDEPENDENT_AMBULATORY_CARE_PROVIDER_SITE_OTHER): Payer: Medicare HMO | Admitting: Cardiology

## 2020-12-18 VITALS — BP 137/69 | HR 87 | Temp 97.0°F | Ht 65.0 in | Wt 128.4 lb

## 2020-12-18 DIAGNOSIS — Z7901 Long term (current) use of anticoagulants: Secondary | ICD-10-CM

## 2020-12-18 DIAGNOSIS — I5032 Chronic diastolic (congestive) heart failure: Secondary | ICD-10-CM

## 2020-12-18 DIAGNOSIS — I4819 Other persistent atrial fibrillation: Secondary | ICD-10-CM

## 2020-12-18 DIAGNOSIS — I4811 Longstanding persistent atrial fibrillation: Secondary | ICD-10-CM

## 2020-12-18 DIAGNOSIS — E785 Hyperlipidemia, unspecified: Secondary | ICD-10-CM

## 2020-12-18 DIAGNOSIS — I495 Sick sinus syndrome: Secondary | ICD-10-CM

## 2020-12-18 NOTE — Patient Instructions (Signed)
Continue your current therapy  Stay on lasix 40 mg daily  Restrict salt intake.  You may get your Covid booster now.  Follow up in 3-4 months

## 2020-12-30 ENCOUNTER — Other Ambulatory Visit: Payer: Self-pay

## 2020-12-30 DIAGNOSIS — Z5321 Procedure and treatment not carried out due to patient leaving prior to being seen by health care provider: Secondary | ICD-10-CM | POA: Diagnosis not present

## 2020-12-30 DIAGNOSIS — R531 Weakness: Secondary | ICD-10-CM | POA: Insufficient documentation

## 2020-12-30 DIAGNOSIS — Z20822 Contact with and (suspected) exposure to covid-19: Secondary | ICD-10-CM | POA: Diagnosis not present

## 2020-12-30 DIAGNOSIS — R0602 Shortness of breath: Secondary | ICD-10-CM | POA: Diagnosis not present

## 2020-12-30 DIAGNOSIS — R110 Nausea: Secondary | ICD-10-CM | POA: Diagnosis not present

## 2020-12-30 LAB — BASIC METABOLIC PANEL
Anion gap: 9 (ref 5–15)
BUN: 36 mg/dL — ABNORMAL HIGH (ref 8–23)
CO2: 24 mmol/L (ref 22–32)
Calcium: 9.2 mg/dL (ref 8.9–10.3)
Chloride: 101 mmol/L (ref 98–111)
Creatinine, Ser: 1.55 mg/dL — ABNORMAL HIGH (ref 0.44–1.00)
GFR, Estimated: 33 mL/min — ABNORMAL LOW (ref >60)
Glucose, Bld: 144 mg/dL — ABNORMAL HIGH (ref 70–99)
Potassium: 4 mmol/L (ref 3.5–5.1)
Sodium: 134 mmol/L — ABNORMAL LOW (ref 135–145)

## 2020-12-30 LAB — CBC
HCT: 40.8 % (ref 36.0–46.0)
Hemoglobin: 13.1 g/dL (ref 12.0–15.0)
MCH: 28.6 pg (ref 26.0–34.0)
MCHC: 32.1 g/dL (ref 30.0–36.0)
MCV: 89.1 fL (ref 80.0–100.0)
Platelets: 178 10*3/uL (ref 150–400)
RBC: 4.58 MIL/uL (ref 3.87–5.11)
RDW: 16.4 % — ABNORMAL HIGH (ref 11.5–15.5)
WBC: 6.4 10*3/uL (ref 4.0–10.5)
nRBC: 0 % (ref 0.0–0.2)

## 2020-12-30 NOTE — ED Notes (Signed)
Stana Bunting Shown, daughter, can be reached at [phone] for any questions about Ms. Hoque situation or updates about care.

## 2020-12-30 NOTE — ED Notes (Signed)
Pt triage completed using interpreter [phone], [id number]. Pt came in with c/o SOB, weakness, nausea that started tonight. Pt O2 saturations are 92%. No hx of lung disease or asthma. Pt has had Covid vaccines and the booster

## 2020-12-31 ENCOUNTER — Emergency Department (HOSPITAL_COMMUNITY)
Admission: EM | Admit: 2020-12-31 | Discharge: 2020-12-31 | Disposition: A | Discharge status: Home / Self Care | Payer: Medicare HMO | Attending: Emergency Medicine | Admitting: Emergency Medicine

## 2020-12-31 ENCOUNTER — Emergency Department (HOSPITAL_COMMUNITY): Payer: Medicare HMO

## 2020-12-31 LAB — POC SARS CORONAVIRUS 2 AG -  ED: SARS Coronavirus 2 Ag: NEGATIVE

## 2020-12-31 LAB — BRAIN NATRIURETIC PEPTIDE: B Natriuretic Peptide: 390.7 pg/mL — ABNORMAL HIGH (ref 0.0–100.0)

## 2020-12-31 NOTE — ED Notes (Signed)
Pt daughter rose would like a call when pt gets into a room

## 2020-12-31 NOTE — ED Notes (Signed)
Pt assisted to go to the bathroom by wheelchair.

## 2021-01-11 ENCOUNTER — Other Ambulatory Visit: Payer: Self-pay

## 2021-01-11 MED ORDER — POTASSIUM CHLORIDE CRYS ER 20 MEQ PO TBCR: 20.0000 meq | EXTENDED_RELEASE_TABLET | Freq: Every day | ORAL | 0 refills | Status: DC

## 2021-01-11 MED ORDER — METOPROLOL TARTRATE 25 MG PO TABS
25.0000 mg | ORAL_TABLET | Freq: Two times a day (BID) | ORAL | 2 refills | Status: DC
Start: 2021-01-11 — End: 2022-01-18

## 2021-01-11 MED ORDER — ATORVASTATIN CALCIUM 80 MG PO TABS
80.0000 mg | ORAL_TABLET | Freq: Every day | ORAL | 3 refills | Status: DC
Start: 2021-01-11 — End: 2022-01-12

## 2021-01-11 MED ORDER — FUROSEMIDE 40 MG PO TABS: 40.0000 mg | ORAL_TABLET | Freq: Every day | ORAL | 2 refills | Status: DC

## 2021-01-12 ENCOUNTER — Other Ambulatory Visit: Payer: Self-pay | Admitting: Pharmacist Clinician (PhC)/ Clinical Pharmacy Specialist

## 2021-01-12 MED ORDER — APIXABAN 2.5 MG PO TABS
2.5000 mg | ORAL_TABLET | Freq: Two times a day (BID) | ORAL | 1 refills | Status: DC
Start: 2021-01-12 — End: 2021-08-24

## 2021-01-12 MED ORDER — POTASSIUM CHLORIDE CRYS ER 20 MEQ PO TBCR: 20.0000 meq | EXTENDED_RELEASE_TABLET | Freq: Every day | ORAL | 3 refills | Status: DC

## 2021-01-15 ENCOUNTER — Ambulatory Visit (INDEPENDENT_AMBULATORY_CARE_PROVIDER_SITE_OTHER): Payer: Medicare HMO

## 2021-01-15 DIAGNOSIS — I495 Sick sinus syndrome: Secondary | ICD-10-CM

## 2021-01-17 LAB — CUP PACEART REMOTE DEVICE CHECK
Battery Impedance: 616 Ohm
Battery Remaining Longevity: 78 mo
Battery Voltage: 2.78 V
Brady Statistic AP VP Percent: 2 %
Brady Statistic AP VS Percent: 13 %
Brady Statistic AS VP Percent: 2 %
Brady Statistic AS VS Percent: 82 %
Date Time Interrogation Session: 20220124220538
Implantable Lead Implant Date: 20150805
Implantable Lead Implant Date: 20150805
Implantable Lead Location: 753859
Implantable Lead Location: 753860
Implantable Lead Model: 5076
Implantable Lead Model: 5076
Implantable Pulse Generator Implant Date: 20150804
Lead Channel Impedance Value: 467 Ohm
Lead Channel Impedance Value: 496 Ohm
Lead Channel Pacing Threshold Amplitude: 0.75 V
Lead Channel Pacing Threshold Amplitude: 0.75 V
Lead Channel Pacing Threshold Pulse Width: 0.4 ms
Lead Channel Pacing Threshold Pulse Width: 0.4 ms
Lead Channel Setting Pacing Amplitude: 2 V
Lead Channel Setting Pacing Amplitude: 2.5 V
Lead Channel Setting Pacing Pulse Width: 0.4 ms
Lead Channel Setting Sensing Sensitivity: 5.6 mV

## 2021-01-25 NOTE — Progress Notes (Signed)
Remote pacemaker transmission.   

## 2021-02-23 ENCOUNTER — Telehealth: Payer: Self-pay | Admitting: Cardiology

## 2021-02-23 NOTE — Telephone Encounter (Signed)
Spoke to patient's daughter [name].She stated she already rescheduled mother's appointment with Dr.Jordan 3/17 at 8:00 am.

## 2021-02-23 NOTE — Telephone Encounter (Signed)
Patient called in with a sign language interpreter on the line.  She rescheduled her 03/22/21 appointment for 03/08/21 with Dr. [name] because she wanted to be seen sooner. Patient also requested Lelon Frohlich as her sign language interpreter. Is someone able to arrange this for her?

## 2021-03-01 ENCOUNTER — Encounter: Payer: Self-pay | Admitting: Nurse Practitioner

## 2021-03-01 ENCOUNTER — Other Ambulatory Visit: Payer: Self-pay

## 2021-03-01 ENCOUNTER — Ambulatory Visit: Payer: Medicare HMO | Admitting: Nurse Practitioner

## 2021-03-01 VITALS — BP 160/90 | HR 84 | Ht 62.0 in | Wt 128.2 lb

## 2021-03-01 DIAGNOSIS — I495 Sick sinus syndrome: Secondary | ICD-10-CM

## 2021-03-01 DIAGNOSIS — I4819 Other persistent atrial fibrillation: Secondary | ICD-10-CM | POA: Diagnosis not present

## 2021-03-01 DIAGNOSIS — I5032 Chronic diastolic (congestive) heart failure: Secondary | ICD-10-CM | POA: Diagnosis not present

## 2021-03-01 DIAGNOSIS — I10 Essential (primary) hypertension: Secondary | ICD-10-CM | POA: Diagnosis not present

## 2021-03-01 NOTE — Progress Notes (Signed)
Electrophysiology Office Note Date: 03/01/2021  ID:  Veronica Young, DOB 1933/05/20, MRN [id number]  PCP: Katherina Mires, MD Primary Cardiologist: [name] Electrophysiologist: Lovena Le  CC: Pacemaker follow-up  Veronica Young is a 87 y.o. female seen today for Dr Lovena Le. She is seen today with an ASL interpreter.   She presents today for routine electrophysiology followup.  Since last being seen in our clinic, the patient reports doing very well.  She denies chest pain, palpitations, dyspnea, PND, orthopnea, nausea, vomiting, dizziness, syncope, edema, weight gain, or early satiety.  Device History: MDT dual chamber PPM implanted 2015 for SSS   Past Medical History:  Diagnosis Date  . Carotid bruit   . CHF (congestive heart failure) ([place])   . Deafness   . Diabetes mellitus    type 2  . Diastolic heart failure (HCC)    EF is 60%  . GERD (gastroesophageal reflux disease)   . Glaucoma   . Hypercholesterolemia   . Hypertension   . Hypothyroidism   . Normal cardiac stress test January 2014  . PAD (peripheral artery disease) ([place])    a. known bilateral popliteal occlusions with conservative management favored.  [name] PAT (paroxysmal atrial tachycardia) ([place])   . Sick sinus syndrome [facility])    a. s/p PPM placement 07/2014   Past Surgical History:  Procedure Laterality Date  . cholycystectomy    . left breast cyst removed    . PACEMAKER INSERTION  07-26-2014   MDT ADDRL1 pacemaker implanted by Dr Lovena Le for SSS  . PERMANENT PACEMAKER INSERTION N/A 07/26/2014   Procedure: PERMANENT PACEMAKER INSERTION;  Surgeon: Evans Lance, MD;  [facility] C[facility] LAB;  Service: Cardiovascular;  Laterality: N/A;  . TOTAL KNEE ARTHROPLASTY     right    Current Outpatient Medications  Medication Sig Dispense Refill  . acetaminophen (TYLENOL) 500 MG tablet Take 1,000 mg by mouth every 6 (six) hours as needed for moderate pain.    [name] apixaban (ELIQUIS) 2.5 MG TABS tablet Take 1 tablet (2.5 mg total)  by mouth 2 (two) times daily. 180 tablet 1  . atorvastatin (LIPITOR) 80 MG tablet Take 1 tablet (80 mg total) by mouth at bedtime. 90 tablet 3  . atropine 1 % ophthalmic solution Place 1 drop into the left eye at bedtime.     . brimonidine (ALPHAGAN) 0.2 % ophthalmic solution Place 1 drop into both eyes daily.    . calcium-vitamin D (OSCAL WITH D) 500-200 MG-UNIT per tablet Take 1 tablet by mouth daily with breakfast.    . cholecalciferol (VITAMIN D) 1000 UNITS tablet Take 1,000 Units by mouth daily.    . citalopram (CELEXA) 20 MG tablet Take 20 mg by mouth at bedtime.     . dorzolamide-timolol (COSOPT) 22.3-6.8 MG/ML ophthalmic solution Place 1 drop into both eyes 2 (two) times daily.     . fluorometholone (FML) 0.1 % ophthalmic suspension Place 1 drop into the left eye 4 (four) times daily.    . furosemide (LASIX) 40 MG tablet Take 1 tablet (40 mg total) by mouth daily. 30 tablet 2  . gabapentin (NEURONTIN) 100 MG capsule Take 100 mg by mouth daily.     . hydrALAZINE (APRESOLINE) 50 MG tablet Take 50 mg by mouth daily.  270 tablet 3  . latanoprost (XALATAN) 0.005 % ophthalmic solution Place 1 drop into both eyes in the morning and at bedtime.   12  . levothyroxine (SYNTHROID, LEVOTHROID) 112 MCG tablet Take 112 mcg  by mouth daily before breakfast.     . metoprolol tartrate (LOPRESSOR) 25 MG tablet Take 1 tablet (25 mg total) by mouth 2 (two) times daily. 180 tablet 2  . OXYGEN Inhale into the lungs. 2 liters as needed    . RHOPRESSA 0.02 % SOLN Place 1 drop into the left eye at bedtime.    . potassium chloride SA (KLOR-CON) 20 MEQ tablet Take 1 tablet (20 mEq total) by mouth daily. 90 tablet 3   No current facility-administered medications for this visit.    Allergies:   Amlodipine and Benicar [olmesartan medoxomil]   Social History: Social History   Socioeconomic History  . Marital status: Widowed    Spouse name: Not on file  . Number of children: 4  . Years of education: Not on  file  . Highest education level: Not on file  Occupational History  . Not on file  Tobacco Use  . Smoking status: Never Smoker  . Smokeless tobacco: Never Used  Substance and Sexual Activity  . Alcohol use: No  . Drug use: No  . Sexual activity: Never  Other Topics Concern  . Not on file  Social History Narrative  . Not on file   Social Determinants of Health   Financial Resource Strain: Not on file  Food Insecurity: Not on file  Transportation Needs: Not on file  Physical Activity: Not on file  Stress: Not on file  Social Connections: Not on file  Intimate Partner Violence: Not on file    Family History: Family History  Problem Relation Age of Onset  . Heart disease Mother   . Liver cancer Father   . Heart disease Sister   . Heart disease Brother   . Stroke Sister      Review of Systems: All other systems reviewed and are otherwise negative except as noted above.   Physical Exam: VS:  BP (!) 160/90   Pulse 84   Ht '5\' 2"'$  (1.575 m)   Wt 128 lb 3.2 oz (58.2 kg)   SpO2 93%   BMI 23.45 kg/m  , BMI Body mass index is 23.45 kg/m.  GEN- The patient is well appearing, alert and oriented x 3 today.   HEENT: normocephalic, atraumatic; sclera clear, conjunctiva pink; hearing intact; oropharynx clear; neck supple  Lungs- Clear to ausculation bilaterally, normal work of breathing.  No wheezes, rales, rhonchi Heart- Irregular rate and rhythm  GI- soft, non-tender, non-distended, bowel sounds present  Extremities- no clubbing, cyanosis, or edema  MS- no significant deformity or atrophy Skin- warm and dry, no rash or lesion; PPM pocket well healed Psych- euthymic mood, full affect Neuro- strength and sensation are intact  PPM Interrogation- reviewed in detail today,  See PACEART report  EKG:  EKG is not ordered today.  Recent Labs: 04/03/2020: ALT 28; TSH 5.569 11/16/2020: Magnesium 2.1 12/30/2020: B Natriuretic Peptide 390.7; BUN 36; Creatinine, Ser 1.55;  Hemoglobin 13.1; Platelets 178; Potassium 4.0; Sodium 134   Wt Readings from Last 3 Encounters:  03/01/21 128 lb 3.2 oz (58.2 kg)  12/30/20 128 lb (58.1 kg)  12/18/20 128 lb 6.4 oz (58.2 kg)     Other studies Reviewed: Additional studies/ records that were reviewed today include: Dr Doug Sou office notes  Assessment and Plan:  1.  Symptomatic bradycardia Normal PPM function See Pace Art report No changes today  2.  HTN Stable No change required today  3.  Persistent atrial fibrillation Burden by device interrogation 43% - asymptomatic  Continue Eliquis for CHADS2VASC of 7  4.  Chronic diastolic heart failure Stable No change required today     Current medicines are reviewed at length with the patient today.   The patient does not have concerns regarding her medicines.  The following changes were made today:  none  Labs/ tests ordered today include: none No orders of the defined types were placed in this encounter.    Disposition:   Follow up with Dr Lovena Le 1 year, [facility]    Signed, Chanetta Marshall, NP 03/01/2021 10:27 AM  [address] [place]  [id number] [phone] (office) [phone] (fax)

## 2021-03-01 NOTE — Patient Instructions (Signed)
Medication Instructions:  Your physician recommends that you continue on your current medications as directed. Please refer to the Current Medication list given to you today.  *If you need a refill on your cardiac medications before your next appointment, please call your pharmacy*   Lab Work: None If you have labs (blood work) drawn today and your tests are completely normal, you will receive your results only by: [name] MyChart Message (if you have MyChart) OR . A paper copy in the mail If you have any lab test that is abnormal or we need to change your treatment, we will call you to review the results.   Follow-Up: At [facility], you and your health needs are our priority.  As part of our continuing mission to provide you with exceptional heart care, we have created designated Provider Care Teams.  These Care Teams include your primary Cardiologist (physician) and Advanced Practice Providers (APPs -  Physician Assistants and Nurse Practitioners) who all work together to provide you with the care you need, when you need it.  We recommend signing up for the patient portal called "MyChart".  Sign up information is provided on this After Visit Summary.  MyChart is used to connect with patients for Virtual Visits (Telemedicine).  Patients are able to view lab/test results, encounter notes, upcoming appointments, etc.  Non-urgent messages can be sent to your provider as well.   To learn more about what you can do with MyChart, go to [email or web address].    Your next appointment:   1 year(s)  The format for your next appointment:   In Person  Provider:   You may see Cristopher Peru, MD or one of the following Advanced Practice Providers on your designated Care Team:    Chanetta Marshall, NP  Tommye Standard, PA-C  Legrand Como "[place]" [place], [place]

## 2021-03-03 NOTE — Progress Notes (Unsigned)
Cardiology Office Note:    Date:  03/08/2021   ID:  KHYLI WEYER, DOB Jun 02, 1933, MRN [id number]  PCP:  Katherina Mires, MD  Cardiologist:  Peter [name], MD  Electrophysiologist:  Cristopher Peru, MD PAD: Dr. Alvester Chou  Referring MD: Katherina Mires, MD   Chief Complaint  Patient presents with  . Congestive Heart Failure  . Atrial Fibrillation    History of Present Illness:    Veronica Young is a 87 y.o. female with a hx of HTN, HLD, DM 2, SSS s/p PPM since August 2015, PAF on Eliquis, PAD with known occluded popliteal artery and a diastolic heart failure. She is deaf and sign language interpreter is present.    She was admitted in June 2018 with chest pain, this occurred in the setting of atrial tachycardia with heart rate of 150.  Thyroid level and electrolyte were normal.  Troponin was negative.  Hemoglobin was 8.6.  Echocardiogram obtained in August 2018 showed EF 65 to 70%, grade 1 DD, moderate TR and MR, PA peak pressure 51 mmHg.  She has known popliteal occlusion on her previous lower extremity ultrasound and has been seen by Dr. Gwenlyn Found.  She is  followed by Dr. Lovena Le for her pacemaker.    In April 2021 she was admitted for acute on chronic diastolic heart failure.  Echocardiogram obtained on 04/03/2020 showed EF 55 to 60%, elevated RVSP 64.3 mmHg, mild MR.  She underwent diuresis and was eventually discharged on 20 mg daily of Lasix.  She was also noted to be in persistent atrial fibrillation. Rate controlled on metoprolol and Eliquis.    She was readmitted in November 2021 with acute CHF. Felt to be related to dietary indiscretion. Creatinine was 1.46. Last device check on January 17, 2021  was normal with  Afib persistent.   She did go to [facility] ED in January with SOB. Had lab drawn but waited in the waiting room without being seen for several hours so she left.   On follow up she states she is doing well. She does note some SOB at times and will put her oxygen on.  Weight is stable.  No edema. Denies palpitations.  Is restricting salt intake. On lasix 40 mg daily. No chest pain.   Past Medical History:  Diagnosis Date  . Carotid bruit   . CHF (congestive heart failure) ([place])   . Deafness   . Diabetes mellitus    type 2  . Diastolic heart failure (HCC)    EF is 60%  . GERD (gastroesophageal reflux disease)   . Glaucoma   . Hypercholesterolemia   . Hypertension   . Hypothyroidism   . Normal cardiac stress test January 2014  . PAD (peripheral artery disease) ([place])    a. known bilateral popliteal occlusions with conservative management favored.  [name] PAT (paroxysmal atrial tachycardia) ([place])   . Sick sinus syndrome [facility])    a. s/p PPM placement 07/2014    Past Surgical History:  Procedure Laterality Date  . cholycystectomy    . left breast cyst removed    . PACEMAKER INSERTION  07-26-2014   MDT ADDRL1 pacemaker implanted by Dr Lovena Le for SSS  . PERMANENT PACEMAKER INSERTION N/A 07/26/2014   Procedure: PERMANENT PACEMAKER INSERTION;  Surgeon: Evans Lance, MD;  Location: [facility] CATH LAB;  Service: Cardiovascular;  Laterality: N/A;  . TOTAL KNEE ARTHROPLASTY     right    Current Medications: Current Meds  Medication Sig  . acetaminophen (  TYLENOL) 500 MG tablet Take 1,000 mg by mouth every 6 (six) hours as needed for moderate pain.  [name] apixaban (ELIQUIS) 2.5 MG TABS tablet Take 1 tablet (2.5 mg total) by mouth 2 (two) times daily.  [name] atorvastatin (LIPITOR) 80 MG tablet Take 1 tablet (80 mg total) by mouth at bedtime.  [name] atropine 1 % ophthalmic solution Place 1 drop into the left eye at bedtime.   . brimonidine (ALPHAGAN) 0.2 % ophthalmic solution Place 1 drop into both eyes daily.  . calcium-vitamin D (OSCAL WITH D) 500-200 MG-UNIT per tablet Take 1 tablet by mouth daily with breakfast.  . cholecalciferol (VITAMIN D) 1000 UNITS tablet Take 1,000 Units by mouth daily.  . citalopram (CELEXA) 20 MG tablet Take 20 mg by mouth at bedtime.   . dorzolamide-timolol  (COSOPT) 22.3-6.8 MG/ML ophthalmic solution Place 1 drop into both eyes 2 (two) times daily.   . fluorometholone (FML) 0.1 % ophthalmic suspension Place 1 drop into the left eye 4 (four) times daily.  . furosemide (LASIX) 40 MG tablet Take 1 tablet (40 mg total) by mouth daily.  [name] gabapentin (NEURONTIN) 100 MG capsule Take 100 mg by mouth daily.   . hydrALAZINE (APRESOLINE) 50 MG tablet Take 50 mg by mouth daily.   [name] latanoprost (XALATAN) 0.005 % ophthalmic solution Place 1 drop into both eyes in the morning and at bedtime.   [name] levothyroxine (SYNTHROID, LEVOTHROID) 112 MCG tablet Take 112 mcg by mouth daily before breakfast.   . metoprolol tartrate (LOPRESSOR) 25 MG tablet Take 1 tablet (25 mg total) by mouth 2 (two) times daily.  . OXYGEN Inhale into the lungs. 2 liters as needed  . RHOPRESSA 0.02 % SOLN Place 1 drop into the left eye at bedtime.     Allergies:   Amlodipine and Benicar [olmesartan medoxomil]   Social History   Socioeconomic History  . Marital status: Widowed    Spouse name: Not on file  . Number of children: 4  . Years of education: Not on file  . Highest education level: Not on file  Occupational History  . Not on file  Tobacco Use  . Smoking status: Never Smoker  . Smokeless tobacco: Never Used  Substance and Sexual Activity  . Alcohol use: No  . Drug use: No  . Sexual activity: Never  Other Topics Concern  . Not on file  Social History Narrative  . Not on file   Social Determinants of Health   Financial Resource Strain: Not on file  Food Insecurity: Not on file  Transportation Needs: Not on file  Physical Activity: Not on file  Stress: Not on file  Social Connections: Not on file     Family History: The patient's family history includes Heart disease in her brother, mother, and sister; Liver cancer in her father; Stroke in her sister.  ROS:   Please see the history of present illness.     All other systems reviewed and are  negative.  EKGs/Labs/Other Studies Reviewed:    The following studies were reviewed today:  Echo 04/03/2020 1. Left ventricular ejection fraction, by estimation, is 55 to 60%. The  left ventricle has normal function. The left ventricle has no regional  wall motion abnormalities. There is mild concentric left ventricular  hypertrophy. Left ventricular diastolic  parameters are indeterminate.  2. Right ventricular systolic function is normal. The right ventricular  size is normal. There is severely elevated pulmonary artery systolic  pressure. The estimated right ventricular systolic  pressure is 64.3 mmHg.  3. Right atrial size was mildly dilated.  4. The mitral valve is normal in structure. Mild mitral valve  regurgitation. No evidence of mitral stenosis.  5. The aortic valve is tricuspid. Aortic valve regurgitation is trivial.  No aortic stenosis is present.  6. The inferior vena cava is dilated in size with <50% respiratory  variability, suggesting right atrial pressure of 15 mmHg.   EKG:  EKG is not ordered today.    Recent Labs: 04/03/2020: ALT 28; TSH 5.569 11/16/2020: Magnesium 2.1 12/30/2020: B Natriuretic Peptide 390.7; BUN 36; Creatinine, Ser 1.55; Hemoglobin 13.1; Platelets 178; Potassium 4.0; Sodium 134  Recent Lipid Panel    Component Value Date/Time   CHOL 141 04/04/2020 0354   TRIG 79 04/04/2020 0354   HDL 33 (L) 04/04/2020 0354   CHOLHDL 4.3 04/04/2020 0354   VLDL 16 04/04/2020 0354   LDLCALC 92 04/04/2020 0354    Physical Exam:    VS:  BP 132/80   Pulse 68   Ht '5\' 5"'$  (1.651 m)   Wt 128 lb (58.1 kg)   BMI 21.30 kg/m     Wt Readings from Last 3 Encounters:  03/08/21 128 lb (58.1 kg)  03/01/21 128 lb 3.2 oz (58.2 kg)  12/30/20 128 lb (58.1 kg)     GEN:  Well nourished, well developed in no acute distress HEENT: Normal NECK: No JVD but V waves visible; No carotid bruits LYMPHATICS: No lymphadenopathy CARDIAC: Irregularly irregular, no murmurs,  rubs, gallops RESPIRATORY:  Clear to auscultation without rales, wheezing or rhonchi  ABDOMEN: Soft, non-tender, non-distended MUSCULOSKELETAL:  No edema; No deformity  SKIN: Warm and dry NEUROLOGIC:  Alert and oriented x 3 PSYCHIATRIC:  Normal affect   ASSESSMENT:    1. Chronic diastolic CHF (congestive heart failure) ([place])   2. Longstanding persistent atrial fibrillation ([place])   3. Chronic anticoagulation   4. Sinus brady-tachy syndrome (HCC)   5. Cardiac pacemaker in situ   6. Essential hypertension    PLAN:    In order of problems listed above:  1. Persistent atrial fibrillation: Rate controlled on metoprolol.  Continue Eliquis.  Pacer check has been OK.   2. Hypertension: Blood pressure is well controlled today.  3. Hyperlipidemia: On Lipitor  4. DM2: Managed by primary care provider  5. Sick sinus syndrome s/p pacemaker: Managed by Dr. Lovena Le  6. PAD: No claudication symptoms.  7. Chronic diastolic heart failure:  Encouraged her to stay on current lasix dose. Sodium restriction. If she notes weight increasing may take extra lasix.   Will arrange follow up in 4 months with APP  Signed, Peter [name], MD  03/08/2021 8:12 AM    [place]

## 2021-03-08 ENCOUNTER — Other Ambulatory Visit: Payer: Self-pay

## 2021-03-08 ENCOUNTER — Ambulatory Visit (INDEPENDENT_AMBULATORY_CARE_PROVIDER_SITE_OTHER): Payer: Medicare HMO | Admitting: Cardiology

## 2021-03-08 ENCOUNTER — Ambulatory Visit: Payer: Medicare HMO | Admitting: Cardiology

## 2021-03-08 ENCOUNTER — Encounter: Payer: Self-pay | Admitting: Cardiology

## 2021-03-08 VITALS — BP 132/80 | HR 68 | Ht 65.0 in | Wt 128.0 lb

## 2021-03-08 DIAGNOSIS — I5032 Chronic diastolic (congestive) heart failure: Secondary | ICD-10-CM

## 2021-03-08 DIAGNOSIS — I10 Essential (primary) hypertension: Secondary | ICD-10-CM

## 2021-03-08 DIAGNOSIS — I4811 Longstanding persistent atrial fibrillation: Secondary | ICD-10-CM

## 2021-03-08 DIAGNOSIS — I495 Sick sinus syndrome: Secondary | ICD-10-CM

## 2021-03-08 DIAGNOSIS — Z7901 Long term (current) use of anticoagulants: Secondary | ICD-10-CM

## 2021-03-08 DIAGNOSIS — Z950 Presence of cardiac pacemaker: Secondary | ICD-10-CM

## 2021-03-22 ENCOUNTER — Ambulatory Visit: Payer: Medicare HMO | Admitting: Cardiology

## 2021-04-16 ENCOUNTER — Ambulatory Visit (INDEPENDENT_AMBULATORY_CARE_PROVIDER_SITE_OTHER): Payer: Medicare HMO

## 2021-04-16 DIAGNOSIS — I495 Sick sinus syndrome: Secondary | ICD-10-CM

## 2021-04-18 LAB — CUP PACEART REMOTE DEVICE CHECK
Battery Impedance: 718 Ohm
Battery Remaining Longevity: 70 mo
Battery Voltage: 2.77 V
Brady Statistic AP VP Percent: 6 %
Brady Statistic AP VS Percent: 1 %
Brady Statistic AS VP Percent: 6 %
Brady Statistic AS VS Percent: 87 %
Date Time Interrogation Session: 20220426210905
Implantable Lead Implant Date: 20150805
Implantable Lead Implant Date: 20150805
Implantable Lead Location: 753859
Implantable Lead Location: 753860
Implantable Lead Model: 5076
Implantable Lead Model: 5076
Implantable Pulse Generator Implant Date: 20150804
Lead Channel Impedance Value: 496 Ohm
Lead Channel Impedance Value: 514 Ohm
Lead Channel Pacing Threshold Amplitude: 0.75 V
Lead Channel Pacing Threshold Amplitude: 0.875 V
Lead Channel Pacing Threshold Pulse Width: 0.4 ms
Lead Channel Pacing Threshold Pulse Width: 0.4 ms
Lead Channel Setting Pacing Amplitude: 2 V
Lead Channel Setting Pacing Amplitude: 2.5 V
Lead Channel Setting Pacing Pulse Width: 0.4 ms
Lead Channel Setting Sensing Sensitivity: 5.6 mV

## 2021-05-02 NOTE — Progress Notes (Signed)
Remote pacemaker transmission.   

## 2021-06-04 ENCOUNTER — Other Ambulatory Visit: Payer: Self-pay

## 2021-06-04 ENCOUNTER — Emergency Department (HOSPITAL_COMMUNITY): Payer: Medicare HMO

## 2021-06-04 ENCOUNTER — Emergency Department (HOSPITAL_COMMUNITY)
Admission: EM | Admit: 2021-06-04 | Discharge: 2021-06-05 | Disposition: A | Discharge status: Home / Self Care | Payer: Medicare HMO | Attending: Physician Assistant | Admitting: Physician Assistant

## 2021-06-04 DIAGNOSIS — I509 Heart failure, unspecified: Secondary | ICD-10-CM | POA: Diagnosis not present

## 2021-06-04 DIAGNOSIS — Z5321 Procedure and treatment not carried out due to patient leaving prior to being seen by health care provider: Secondary | ICD-10-CM | POA: Insufficient documentation

## 2021-06-04 DIAGNOSIS — R0602 Shortness of breath: Secondary | ICD-10-CM | POA: Diagnosis not present

## 2021-06-04 LAB — CBC WITH DIFFERENTIAL/PLATELET
Abs Immature Granulocytes: 0.03 10*3/uL (ref 0.00–0.07)
Basophils Absolute: 0 10*3/uL (ref 0.0–0.1)
Basophils Relative: 1 %
Eosinophils Absolute: 0.1 10*3/uL (ref 0.0–0.5)
Eosinophils Relative: 1 %
HCT: 40.1 % (ref 36.0–46.0)
Hemoglobin: 12.8 g/dL (ref 12.0–15.0)
Immature Granulocytes: 1 %
Lymphocytes Relative: 16 %
Lymphs Abs: 1 10*3/uL (ref 0.7–4.0)
MCH: 27.5 pg (ref 26.0–34.0)
MCHC: 31.9 g/dL (ref 30.0–36.0)
MCV: 86.2 fL (ref 80.0–100.0)
Monocytes Absolute: 0.6 10*3/uL (ref 0.1–1.0)
Monocytes Relative: 10 %
Neutro Abs: 4.4 10*3/uL (ref 1.7–7.7)
Neutrophils Relative %: 71 %
Platelets: 145 10*3/uL — ABNORMAL LOW (ref 150–400)
RBC: 4.65 MIL/uL (ref 3.87–5.11)
RDW: 18 % — ABNORMAL HIGH (ref 11.5–15.5)
WBC: 6.2 10*3/uL (ref 4.0–10.5)
nRBC: 0 % (ref 0.0–0.2)

## 2021-06-04 LAB — BRAIN NATRIURETIC PEPTIDE: B Natriuretic Peptide: 414.8 pg/mL — ABNORMAL HIGH (ref 0.0–100.0)

## 2021-06-04 LAB — BASIC METABOLIC PANEL
Anion gap: 10 (ref 5–15)
BUN: 27 mg/dL — ABNORMAL HIGH (ref 8–23)
CO2: 24 mmol/L (ref 22–32)
Calcium: 9 mg/dL (ref 8.9–10.3)
Chloride: 98 mmol/L (ref 98–111)
Creatinine, Ser: 1.14 mg/dL — ABNORMAL HIGH (ref 0.44–1.00)
GFR, Estimated: 47 mL/min — ABNORMAL LOW (ref >60)
Glucose, Bld: 124 mg/dL — ABNORMAL HIGH (ref 70–99)
Potassium: 3.7 mmol/L (ref 3.5–5.1)
Sodium: 132 mmol/L — ABNORMAL LOW (ref 135–145)

## 2021-06-04 LAB — TROPONIN I (HIGH SENSITIVITY): Troponin I (High Sensitivity): 9 ng/L (ref <18)

## 2021-06-04 NOTE — ED Triage Notes (Signed)
ASL interpreter used for triage. Pt came in with c/o SOB that started at 1800 today. Pt has hx of CHF and uses O2 as needed at home. Pt is on Lasix '40mg'$ . O2 saturation 91% RA.

## 2021-06-04 NOTE — ED Provider Notes (Signed)
Emergency Medicine Provider Triage Evaluation Note  Veronica Young , a 87 y.o. female  was evaluated in triage.  ASL interpreter used for evaluation. pt complains of shortness of breath that started tonight.  She denies any chest pain.  She denies any cough.  Review of Systems  Positive: sob Negative: Chest pain, cough  Physical Exam  BP (!) 154/82   Pulse 76   Temp 97.7 F (36.5 C) (Oral)   Resp (!) 22   Ht '5\' 5"'$  (1.651 m)   Wt 56.7 kg   SpO2 92%   BMI 20.80 kg/m  Gen:   Awake, no distress   Resp:  Normal effort  MSK:   Moves extremities without difficulty  Other:  Ble edema. Bibasilar rales  Medical Decision Making  Medically screening exam initiated at 9:01 PM.  Appropriate orders placed.  Veronica Young was informed that the remainder of the evaluation will be completed by another provider, this initial triage assessment does not replace that evaluation, and the importance of remaining in the ED until their evaluation is complete.     Bishop Dublin 06/04/21 2115    Truddie Hidden, MD 06/04/21 [phone]

## 2021-06-05 ENCOUNTER — Telehealth: Payer: Self-pay | Admitting: Cardiology

## 2021-06-05 NOTE — Telephone Encounter (Signed)
Returned call to patient's daughter  Patient went to [facility] ED yesterday -- left without being seen  Patient had previously been on home oxygen, she c/o shortness of breath yesterday and daughter started her on oxygen, and then took her to ED  Despite LWBS - labs, CXR done but patient does not have report  Per daughter, the patient reports shortness of breath, weakness, no energy, leg pain/leg burning (today) - she has PAD, was previously being followed by Angelena Form MD per daughter's report  Patient has an appointment with PCP tomorrow  Advised that she monitor daily weights, O2 sats, BP/pulse  Advised will send a message to MD to review ED notes, testing and advise on recommendations

## 2021-06-05 NOTE — ED Notes (Signed)
Pt left with her caretaker.

## 2021-06-05 NOTE — Telephone Encounter (Signed)
    Rose calling, she said pt was in the ED last night and came home with SOB, weak, no energy, right now pt is complaining of leg pain, she said it feels like leg burning. She wanted to speak with Dr. Doug Sou nurse and also to get sooner appt if possible

## 2021-06-07 ENCOUNTER — Other Ambulatory Visit: Payer: Self-pay | Admitting: Cardiology

## 2021-06-10 NOTE — Telephone Encounter (Signed)
I reviewed vitals which looked good. I did review labs. Troponin was normal. BNP was 414 which is slightly higher than prior. BMET was OK. Renal function better than before. Chronically low sodium. CBC OK with chronically mildly reduced platelet count. CXR was OK. What was the outcome of her visit with PCP?  Martrice Apt [name] MD, [facility]

## 2021-06-15 ENCOUNTER — Telehealth: Payer: Self-pay | Admitting: Cardiology

## 2021-06-15 DIAGNOSIS — I739 Peripheral vascular disease, unspecified: Secondary | ICD-10-CM

## 2021-06-15 NOTE — Telephone Encounter (Signed)
Patient's daughter called to say that she has a ulcer and she is not sure how to go about treating it

## 2021-06-15 NOTE — Telephone Encounter (Signed)
Spoke to patient's daughter [name].She stated she forgot to tell me earlier mother has a ulcer on side of right calf appox size of a nickel.Stated she just told her about the ulcer today.She noticed the first of this week. Stated ulcer is red and slightly swollen.No pain.Advised ok to use Neosporin oint.I will speak to Dr.Jordan on Monday 6/27 and call you back with his recommendation.

## 2021-06-15 NOTE — Telephone Encounter (Signed)
Spoke to patient's daughter [name] she stated mother is feeling better.She saw PCP and he told her about recent lab work.He did not make any changes.Advised to keep appointment already scheduled with Almyra Deforest PA 7/6 at 11:45 am.

## 2021-06-18 NOTE — Telephone Encounter (Signed)
Called patient's daughter Veronica Young Dr.Jordan advised lower ext arterial dopplers.Advised scheduler will call back to schedule.

## 2021-06-27 ENCOUNTER — Ambulatory Visit (INDEPENDENT_AMBULATORY_CARE_PROVIDER_SITE_OTHER): Payer: Medicare HMO | Admitting: Physician Assistant

## 2021-06-27 ENCOUNTER — Encounter: Payer: Self-pay | Admitting: Physician Assistant

## 2021-06-27 ENCOUNTER — Other Ambulatory Visit: Payer: Self-pay

## 2021-06-27 VITALS — BP 140/78 | HR 68 | Ht 65.0 in | Wt 127.2 lb

## 2021-06-27 DIAGNOSIS — I10 Essential (primary) hypertension: Secondary | ICD-10-CM | POA: Diagnosis not present

## 2021-06-27 DIAGNOSIS — I4819 Other persistent atrial fibrillation: Secondary | ICD-10-CM | POA: Diagnosis not present

## 2021-06-27 DIAGNOSIS — E785 Hyperlipidemia, unspecified: Secondary | ICD-10-CM

## 2021-06-27 DIAGNOSIS — I5032 Chronic diastolic (congestive) heart failure: Secondary | ICD-10-CM

## 2021-06-27 DIAGNOSIS — E119 Type 2 diabetes mellitus without complications: Secondary | ICD-10-CM

## 2021-06-27 DIAGNOSIS — Z950 Presence of cardiac pacemaker: Secondary | ICD-10-CM

## 2021-06-27 NOTE — Progress Notes (Signed)
Cardiology Office Note:    Date:  06/29/2021   ID:  Veronica Young, DOB 08-Oct-1933, MRN [id number]  PCP:  Katherina Mires, MD   [facility] Providers Cardiologist:  Peter [name], MD Electrophysiologist:  Cristopher Peru, MD     Referring MD: Katherina Mires, MD   Chief Complaint  Patient presents with   Follow-up    Seen for Dr. [name]    History of Present Illness:    Veronica Young is a 87 y.o. female with a hx of hypertension, hyperlipidemia, DM 2, SSS s/p PPM August 2015, persistent atrial fibrillation on Eliquis, chronic diastolic heart failure and PAD with occluded popliteal artery.  Patient is deaf and relies on son language interpreter.  She was admitted in June 2018 with chest pain, this occurred in the setting of atrial tachycardia with heart rate up to 150 bpm.  Thyroid level and electrolyte were normal.  Troponin normal.  Hemoglobin was low at 8.6.  Echocardiogram obtained in August 2018 showed EF 65 to 70%, grade 1 DD, moderate TR and MR, PA peak pressure 51 mmHg.  Patient was admitted for acute on chronic diastolic heart failure in April 2021.  Repeat echocardiogram at the time showed EF 55 to 60%, RVSP elevated to 64.3 mmHg, moderate MR.  She underwent IV diuresis and was discharged on 20 mg daily of Lasix.  She was also noted to be in persistent atrial fibrillation which was rate controlled on metoprolol.  She was last seen by Dr. [name] in March 2022 at which time she was doing well.  More recently, patient presented to the ED on 06/04/2021 with report of shortness of breath, weakness and leg pain.  BNP was mildly elevated.  Troponin was normal.  Creatinine was 1.14.  Sodium borderline low at 132.  Potassium normal.  White blood cell count normal.  Chest x-ray shows no acute process.  Patient presents today for follow-up.  She has mild crackles in bilateral bases, lower extremity shows dilated veins but no obvious pitting edema.  Majority of her lung sounds clear.  Heart rate is  still irregular while in atrial fibrillation.  Overall she has been doing well.  She may take extra dose of Lasix in the afternoon if she is started having increasing shortness of breath.  She uses oxygen at night and as needed during the day.  Otherwise, she can follow-up with Dr. [name] in 4 months.  Interview was conducted with the help of a sign language interpreter  Past Medical History:  Diagnosis Date   Carotid bruit    CHF (congestive heart failure) ([place])    Deafness    Diabetes mellitus    type 2   Diastolic heart failure (HCC)    EF is 60%   GERD (gastroesophageal reflux disease)    Glaucoma    Hypercholesterolemia    Hypertension    Hypothyroidism    Normal cardiac stress test January 2014   PAD (peripheral artery disease) ([place])    a. known bilateral popliteal occlusions with conservative management favored.   PAT (paroxysmal atrial tachycardia) (HCC)    Sick sinus syndrome [facility])    a. s/p PPM placement 07/2014    Past Surgical History:  Procedure Laterality Date   cholycystectomy     left breast cyst removed     PACEMAKER INSERTION  07-26-2014   MDT ADDRL1 pacemaker implanted by Dr Lovena Le for [place] N/A 07/26/2014   Procedure: PERMANENT PACEMAKER INSERTION;  Surgeon: Evans Lance, MD;  Location: [facility] CATH LAB;  Service: Cardiovascular;  Laterality: N/A;   TOTAL KNEE ARTHROPLASTY     right    Current Medications: Current Meds  Medication Sig   acetaminophen (TYLENOL) 500 MG tablet Take 1,000 mg by mouth every 6 (six) hours as needed for moderate pain.   apixaban (ELIQUIS) 2.5 MG TABS tablet Take 1 tablet (2.5 mg total) by mouth 2 (two) times daily.   atorvastatin (LIPITOR) 80 MG tablet Take 1 tablet (80 mg total) by mouth at bedtime.   atropine 1 % ophthalmic solution Place 1 drop into the left eye at bedtime.    brimonidine (ALPHAGAN) 0.2 % ophthalmic solution Place 1 drop into both eyes daily.   calcium-vitamin D (OSCAL WITH D)  500-200 MG-UNIT per tablet Take 1 tablet by mouth daily with breakfast.   cholecalciferol (VITAMIN D) 1000 UNITS tablet Take 1,000 Units by mouth daily.   citalopram (CELEXA) 20 MG tablet Take 20 mg by mouth at bedtime.    dorzolamide-timolol (COSOPT) 22.3-6.8 MG/ML ophthalmic solution Place 1 drop into both eyes 2 (two) times daily.    fluorometholone (FML) 0.1 % ophthalmic suspension Place 1 drop into the left eye 4 (four) times daily.   furosemide (LASIX) 40 MG tablet TAKE 1 TABLET EVERY DAY   gabapentin (NEURONTIN) 100 MG capsule Take 100 mg by mouth daily.    hydrALAZINE (APRESOLINE) 50 MG tablet Take 50 mg by mouth daily.    latanoprost (XALATAN) 0.005 % ophthalmic solution Place 1 drop into both eyes in the morning and at bedtime.    levothyroxine (SYNTHROID, LEVOTHROID) 112 MCG tablet Take 112 mcg by mouth daily before breakfast.    metoprolol tartrate (LOPRESSOR) 25 MG tablet Take 1 tablet (25 mg total) by mouth 2 (two) times daily.   OXYGEN Inhale into the lungs. 2 liters as needed   RHOPRESSA 0.02 % SOLN Place 1 drop into the left eye at bedtime.     Allergies:   Amlodipine and Benicar [olmesartan medoxomil]   Social History   Socioeconomic History   Marital status: Widowed    Spouse name: Not on file   Number of children: 4   Years of education: Not on file   Highest education level: Not on file  Occupational History   Not on file  Tobacco Use   Smoking status: Never   Smokeless tobacco: Never  Substance and Sexual Activity   Alcohol use: No   Drug use: No   Sexual activity: Never  Other Topics Concern   Not on file  Social History Narrative   Not on file   Social Determinants of Health   Financial Resource Strain: Not on file  Food Insecurity: Not on file  Transportation Needs: Not on file  Physical Activity: Not on file  Stress: Not on file  Social Connections: Not on file     Family History: The patient's family history includes Heart disease in her  brother, mother, and sister; Liver cancer in her father; Stroke in her sister.  ROS:   Please see the history of present illness.     All other systems reviewed and are negative.  EKGs/Labs/Other Studies Reviewed:    The following studies were reviewed today:  Echo 04/03/2020  1. Left ventricular ejection fraction, by estimation, is 55 to 60%. The  left ventricle has normal function. The left ventricle has no regional  wall motion abnormalities. There is mild concentric left ventricular  hypertrophy. Left ventricular  diastolic  parameters are indeterminate.   2. Right ventricular systolic function is normal. The right ventricular  size is normal. There is severely elevated pulmonary artery systolic  pressure. The estimated right ventricular systolic pressure is 99991111 mmHg.   3. Right atrial size was mildly dilated.   4. The mitral valve is normal in structure. Mild mitral valve  regurgitation. No evidence of mitral stenosis.   5. The aortic valve is tricuspid. Aortic valve regurgitation is trivial.  No aortic stenosis is present.   6. The inferior vena cava is dilated in size with <50% respiratory  variability, suggesting right atrial pressure of 15 mmHg.   EKG:  EKG is not ordered today.    Recent Labs: 11/16/2020: Magnesium 2.1 06/04/2021: B Natriuretic Peptide 414.8; BUN 27; Creatinine, Ser 1.14; Hemoglobin 12.8; Platelets 145; Potassium 3.7; Sodium 132  Recent Lipid Panel    Component Value Date/Time   CHOL 141 04/04/2020 0354   TRIG 79 04/04/2020 0354   HDL 33 (L) 04/04/2020 0354   CHOLHDL 4.3 04/04/2020 0354   VLDL 16 04/04/2020 0354   LDLCALC 92 04/04/2020 0354     Risk Assessment/Calculations:    CHA2DS2-VASc Score = 6  This indicates a 9.7% annual risk of stroke. The patient's score is based upon: CHF History: Yes HTN History: Yes Diabetes History: Yes Stroke History: No Vascular Disease History: No Age Score: 2 Gender Score: 1         Physical Exam:     VS:  BP 140/78   Pulse 68   Ht '5\' 5"'$  (1.651 m)   Wt 127 lb 3.2 oz (57.7 kg)   BMI 21.17 kg/m     Wt Readings from Last 3 Encounters:  06/27/21 127 lb 3.2 oz (57.7 kg)  06/04/21 125 lb (56.7 kg)  03/08/21 128 lb (58.1 kg)     GEN:  Well nourished, well developed in no acute distress HEENT: Normal NECK: No JVD; No carotid bruits LYMPHATICS: No lymphadenopathy CARDIAC: Irregularly irregular, no murmurs, rubs, gallops RESPIRATORY:  Clear to auscultation without rales, wheezing or rhonchi  ABDOMEN: Soft, non-tender, non-distended MUSCULOSKELETAL:  No edema; No deformity  SKIN: Warm and dry NEUROLOGIC:  Alert and oriented x 3 PSYCHIATRIC:  Normal affect   ASSESSMENT:    1. Persistent atrial fibrillation ([place])   2. Essential hypertension   3. Hyperlipidemia LDL goal <70   4. Controlled type 2 diabetes mellitus without complication, without long-term current use of insulin ([place])   5. Pacemaker   6. Chronic diastolic CHF (congestive heart failure) (HCC)    PLAN:    In order of problems listed above:  Persistent atrial fibrillation: Continue with rate control strategy.  On Eliquis 2.5 mg twice a day and metoprolol tartrate.  Hypertension: Blood pressure stable on current therapy  Hyperlipidemia: On Lipitor  DM2: Managed by primary care provider  History of pacemaker: Followed by EP service  Chronic diastolic heart failure: Euvolemic on physical exam.        Medication Adjustments/Labs and Tests Ordered: Current medicines are reviewed at length with the patient today.  Concerns regarding medicines are outlined above.  No orders of the defined types were placed in this encounter.  No orders of the defined types were placed in this encounter.   Patient Instructions  Medication Instructions:  Your physician recommends that you continue on your current medications as directed. Please refer to the Current Medication list given to you today.  If increased shortness  of breath may take  an extra as needed does of Lasix in the afternoon *If you need a refill on your cardiac medications before your next appointment, please call your pharmacy*  Lab Work: NONE ordered at this time of appointment   If you have labs (blood work) drawn today and your tests are completely normal, you will receive your results only by: [place] (if you have MyChart) OR A paper copy in the mail If you have any lab test that is abnormal or we need to change your treatment, we will call you to review the results.  Testing/Procedures: NONE ordered at this time of appointment   Follow-Up: At [facility], you and your health needs are our priority.  As part of our continuing mission to provide you with exceptional heart care, we have created designated Provider Care Teams.  These Care Teams include your primary Cardiologist (physician) and Advanced Practice Providers (APPs -  Physician Assistants and Nurse Practitioners) who all work together to provide you with the care you need, when you need it.  We recommend signing up for the patient portal called "MyChart".  Sign up information is provided on this After Visit Summary.  MyChart is used to connect with patients for Virtual Visits (Telemedicine).  Patients are able to view lab/test results, encounter notes, upcoming appointments, etc.  Non-urgent messages can be sent to your provider as well.   To learn more about what you can do with MyChart, go to [email or web address].    Your next appointment:   4-5 month(s)  The format for your next appointment:   In Person  Provider:   Peter [name], MD  Other Instructions    Signed, Almyra Deforest, [place]  06/29/2021 11:34 PM    [place]

## 2021-06-27 NOTE — Patient Instructions (Signed)
Medication Instructions:  Your physician recommends that you continue on your current medications as directed. Please refer to the Current Medication list given to you today.  If increased shortness of breath may take an extra as needed does of Lasix in the afternoon *If you need a refill on your cardiac medications before your next appointment, please call your pharmacy*  Lab Work: NONE ordered at this time of appointment   If you have labs (blood work) drawn today and your tests are completely normal, you will receive your results only by: [place] (if you have MyChart) OR A paper copy in the mail If you have any lab test that is abnormal or we need to change your treatment, we will call you to review the results.  Testing/Procedures: NONE ordered at this time of appointment   Follow-Up: At [facility], you and your health needs are our priority.  As part of our continuing mission to provide you with exceptional heart care, we have created designated Provider Care Teams.  These Care Teams include your primary Cardiologist (physician) and Advanced Practice Providers (APPs -  Physician Assistants and Nurse Practitioners) who all work together to provide you with the care you need, when you need it.  We recommend signing up for the patient portal called "MyChart".  Sign up information is provided on this After Visit Summary.  MyChart is used to connect with patients for Virtual Visits (Telemedicine).  Patients are able to view lab/test results, encounter notes, upcoming appointments, etc.  Non-urgent messages can be sent to your provider as well.   To learn more about what you can do with MyChart, go to [email or web address].    Your next appointment:   4-5 month(s)  The format for your next appointment:   In Person  Provider:   Peter [name], MD  Other Instructions

## 2021-06-29 ENCOUNTER — Other Ambulatory Visit: Payer: Self-pay | Admitting: Cardiology

## 2021-06-29 ENCOUNTER — Encounter: Payer: Self-pay | Admitting: Physician Assistant

## 2021-06-29 DIAGNOSIS — I739 Peripheral vascular disease, unspecified: Secondary | ICD-10-CM

## 2021-07-12 ENCOUNTER — Inpatient Hospital Stay (HOSPITAL_COMMUNITY): Admission: RE | Admit: 2021-07-12 | Payer: Medicare HMO | Source: Ambulatory Visit

## 2021-07-16 ENCOUNTER — Ambulatory Visit (INDEPENDENT_AMBULATORY_CARE_PROVIDER_SITE_OTHER): Payer: Medicare HMO

## 2021-07-16 DIAGNOSIS — I495 Sick sinus syndrome: Secondary | ICD-10-CM | POA: Diagnosis not present

## 2021-07-20 LAB — CUP PACEART REMOTE DEVICE CHECK
Battery Impedance: 766 Ohm
Battery Remaining Longevity: 67 mo
Battery Voltage: 2.77 V
Brady Statistic AP VP Percent: 5 %
Brady Statistic AP VS Percent: 1 %
Brady Statistic AS VP Percent: 6 %
Brady Statistic AS VS Percent: 89 %
Date Time Interrogation Session: 20220729104259
Implantable Lead Implant Date: 20150805
Implantable Lead Implant Date: 20150805
Implantable Lead Location: 753859
Implantable Lead Location: 753860
Implantable Lead Model: 5076
Implantable Lead Model: 5076
Implantable Pulse Generator Implant Date: 20150804
Lead Channel Impedance Value: 482 Ohm
Lead Channel Impedance Value: 493 Ohm
Lead Channel Pacing Threshold Amplitude: 0.75 V
Lead Channel Pacing Threshold Amplitude: 0.875 V
Lead Channel Pacing Threshold Pulse Width: 0.4 ms
Lead Channel Pacing Threshold Pulse Width: 0.4 ms
Lead Channel Setting Pacing Amplitude: 2 V
Lead Channel Setting Pacing Amplitude: 2.5 V
Lead Channel Setting Pacing Pulse Width: 0.4 ms
Lead Channel Setting Sensing Sensitivity: 5.6 mV

## 2021-07-27 ENCOUNTER — Ambulatory Visit (HOSPITAL_COMMUNITY)
Admission: RE | Admit: 2021-07-27 | Discharge: 2021-07-27 | Disposition: A | Discharge status: Home / Self Care | Payer: Medicare HMO | Source: Ambulatory Visit | Attending: Cardiovascular Disease | Admitting: Cardiovascular Disease

## 2021-07-27 ENCOUNTER — Other Ambulatory Visit: Payer: Self-pay

## 2021-07-27 DIAGNOSIS — I739 Peripheral vascular disease, unspecified: Secondary | ICD-10-CM | POA: Insufficient documentation

## 2021-08-07 NOTE — Progress Notes (Signed)
Remote pacemaker transmission.   

## 2021-08-24 ENCOUNTER — Telehealth: Payer: Self-pay | Admitting: Cardiology

## 2021-08-24 MED ORDER — APIXABAN 2.5 MG PO TABS: 2.5000 mg | ORAL_TABLET | Freq: Two times a day (BID) | ORAL | 1 refills | Status: DC

## 2021-08-24 NOTE — Telephone Encounter (Signed)
Prescription refill request for Eliquis received. Indication:afib Last office visit:meng 06/27/21 Scr:1.14 06/04/21 Age: 6fWeight:57.7kg

## 2021-08-24 NOTE — Telephone Encounter (Signed)
*  STAT* If patient is at the pharmacy, call can be transferred to refill team.   1. Which medications need to be refilled? (please list name of each medication and dose if known)  apixaban (ELIQUIS) 2.5 MG TABS tablet  2. Which pharmacy/location (including street and city if local pharmacy) is medication to be sent to?  [facility] - [place], [place]  3. Do they need a 30 day or 90 day supply?  30 day supply

## 2021-08-30 ENCOUNTER — Telehealth: Payer: Self-pay | Admitting: Cardiology

## 2021-08-30 MED ORDER — APIXABAN 2.5 MG PO TABS: 2.5000 mg | ORAL_TABLET | Freq: Two times a day (BID) | ORAL | 1 refills | Status: DC

## 2021-08-30 NOTE — Telephone Encounter (Signed)
*  STAT* If patient is at the pharmacy, call can be transferred to refill team.   1. Which medications need to be refilled? (please list name of each medication and dose if known)  new prescription for Eliquis  2. Which pharmacy/location (including street and city if local pharmacy) is medication to be sent to? [facility]- Please fax so she can get it sooner- [phone]- she only have a few left  3. Do they need a 30 day or 90 day supply? 30 days and refills

## 2021-08-30 NOTE — Telephone Encounter (Signed)
Prescription refill request for Eliquis received. Indication:afib Last office visit:meng 06/27/21 Scr:1.14 06/04/21 Age: 69fWeight:57.7kg

## 2021-10-15 ENCOUNTER — Ambulatory Visit (INDEPENDENT_AMBULATORY_CARE_PROVIDER_SITE_OTHER): Payer: Medicare HMO

## 2021-10-15 DIAGNOSIS — I495 Sick sinus syndrome: Secondary | ICD-10-CM

## 2021-10-16 LAB — CUP PACEART REMOTE DEVICE CHECK
Battery Impedance: 791 Ohm
Battery Remaining Longevity: 66 mo
Battery Voltage: 2.77 V
Brady Statistic AP VP Percent: 5 %
Brady Statistic AP VS Percent: 1 %
Brady Statistic AS VP Percent: 6 %
Brady Statistic AS VS Percent: 88 %
Date Time Interrogation Session: 20221024201800
Implantable Lead Implant Date: 20150805
Implantable Lead Implant Date: 20150805
Implantable Lead Location: 753859
Implantable Lead Location: 753860
Implantable Lead Model: 5076
Implantable Lead Model: 5076
Implantable Pulse Generator Implant Date: 20150804
Lead Channel Impedance Value: 456 Ohm
Lead Channel Impedance Value: 467 Ohm
Lead Channel Pacing Threshold Amplitude: 0.75 V
Lead Channel Pacing Threshold Amplitude: 0.875 V
Lead Channel Pacing Threshold Pulse Width: 0.4 ms
Lead Channel Pacing Threshold Pulse Width: 0.4 ms
Lead Channel Setting Pacing Amplitude: 2 V
Lead Channel Setting Pacing Amplitude: 2.5 V
Lead Channel Setting Pacing Pulse Width: 0.4 ms
Lead Channel Setting Sensing Sensitivity: 5.6 mV

## 2021-10-23 NOTE — Progress Notes (Signed)
Remote pacemaker transmission.   

## 2021-10-24 ENCOUNTER — Telehealth: Payer: Self-pay | Admitting: Cardiology

## 2021-10-24 MED ORDER — HYDRALAZINE HCL 50 MG PO TABS: 50.0000 mg | ORAL_TABLET | Freq: Every day | ORAL | 1 refills | Status: DC

## 2021-10-24 NOTE — Telephone Encounter (Signed)
Spoke with pt. Pt is aware that medication was sent into pts pharmacy this morning.

## 2021-10-24 NOTE — Telephone Encounter (Signed)
*  STAT* If patient is at the pharmacy, call can be transferred to refill team.   1. Which medications need to be refilled? (please list name of each medication and dose if known)  hydrALAZINE (APRESOLINE) 50 MG tablet  2. Which pharmacy/location (including street and city if local pharmacy) is medication to be sent to?  [id number] - [place], [place] - [address]  3. Do they need a 30 day or 90 day supply? [place]

## 2021-10-24 NOTE — Telephone Encounter (Signed)
*  STAT* If patient is at the pharmacy, call can be transferred to refill team.   1. Which medications need to be refilled? (please list name of each medication and dose if known)  hydrALAZINE (APRESOLINE) 50 MG tablet  2. Which pharmacy/location (including street and city if local pharmacy) is medication to be sent to?  [place], [place]  3. Do they need a 30 day or 90 day supply? 90  **PATIENT NEEDS A QUICK MONTH SUPPLY DUE TO OUT OF MEDICATION**

## 2021-10-24 NOTE — Telephone Encounter (Signed)
One month supply has been sent in with one refill. The patient has an appointment on 11/22

## 2021-11-02 ENCOUNTER — Telehealth: Payer: Self-pay | Admitting: Cardiology

## 2021-11-02 NOTE — Telephone Encounter (Signed)
   [facility] Pre-operative Risk Assessment    Patient Name: Veronica Young  DOB: Nov 22, 1933 MRN: [id number]  HEARTCARE STAFF:  - IMPORTANT!!!!!! Under Visit Info/Reason for Call, type in Other and utilize the format Clearance MM/DD/YY or Clearance TBD. Do not use dashes or single digits. - Please review there is not already an duplicate clearance open for this procedure. - If request is for dental extraction, please clarify the # of teeth to be extracted. - If the patient is currently at the dentist's office, call Pre-Op Callback Staff (MA/nurse) to input urgent request.  - If the patient is not currently in the dentist office, please route to the Pre-Op pool.  Request for surgical clearance:  What type of surgery is being performed? Exact tooth # 25  When is this surgery scheduled? TBD  What type of clearance is required (medical clearance vs. Pharmacy clearance to hold med vs. Both)? Both  Are there any medications that need to be held prior to surgery and how long? Eliquis, how long to hold  Practice name and name of physician performing surgery? [facility] Sander Nephew  What is the office phone number? [phone]   7.   What is the office fax number? [phone]  8.   Anesthesia type (None, local, MAC, general) ? Local    Ermelinda Das 11/02/2021, 12:58 PM  _________________________________________________________________   (provider comments below)

## 2021-11-05 NOTE — Telephone Encounter (Signed)
   Patient Name: Veronica Young  DOB: July 31, 1933 MRN: [id number]  Primary Cardiologist: Peter [name], MD  Chart reviewed as part of pre-operative protocol coverage.   Simple dental extractions (I.e. just 1-2 teeth) are considered low risk procedures per guidelines and generally do not require any specific cardiac clearance. It is also generally accepted that for simple extractions and dental cleanings, there is no need to interrupt blood thinner therapy. We do not typically stop anticoagulation for a single dental extraction.  SBE prophylaxis is not required for the patient from a cardiac standpoint based on available records.  I will route this recommendation to the requesting party via Epic fax function and remove from pre-op pool.  Please call with questions.  Charlie Pitter, PA-C 11/05/2021, 11:10 AM

## 2021-11-08 ENCOUNTER — Telehealth: Payer: Self-pay | Admitting: Cardiology

## 2021-11-08 NOTE — Telephone Encounter (Signed)
Patient calling the office for samples of medication:   1.  What medication and dosage are you requesting samples for? apixaban (ELIQUIS) 2.5 MG TABS tablet  2.  Are you currently out of this medication? Pt is out of this medicine

## 2021-11-09 ENCOUNTER — Other Ambulatory Visit: Payer: Self-pay

## 2021-11-09 MED ORDER — APIXABAN 2.5 MG PO TABS: 2.5000 mg | ORAL_TABLET | Freq: Two times a day (BID) | ORAL | 1 refills | Status: DC

## 2021-11-09 NOTE — Telephone Encounter (Signed)
Prescription refill request for Eliquis received. Indication:Afib Last office visit:7/22 Scr:1.1 Age: 87 Weight:57.7 kg  Prescription refilled

## 2021-11-11 NOTE — Progress Notes (Signed)
Cardiology Office Note:    Date:  11/13/2021   ID:  [name], DOB 1933/03/31, MRN [id number]  PCP:  Katherina Mires, MD   [facility] Providers Cardiologist:  Tinsleigh Slovacek [name], MD Electrophysiologist:  Cristopher Peru, MD     Referring MD: Katherina Mires, MD   Chief Complaint  Patient presents with   Atrial Fibrillation   Congestive Heart Failure     History of Present Illness:    Veronica Young is a 87 y.o. female with a hx of hypertension, hyperlipidemia, DM 2, SSS s/p PPM August 2015, persistent atrial fibrillation on Eliquis, chronic diastolic heart failure and PAD with occluded popliteal artery.  Patient is deaf and uses a sign language interpreter.  She was admitted in June 2018 with chest pain, this occurred in the setting of atrial tachycardia with heart rate up to 150 bpm.  Thyroid level and electrolyte were normal.  Troponin normal.  Hemoglobin was low at 8.6.  Echocardiogram obtained in August 2018 showed EF 65 to 70%, grade 1 DD, moderate TR and MR, PA peak pressure 51 mmHg.  Patient was admitted for acute on chronic diastolic heart failure in April 2021.  Repeat echocardiogram at the time showed EF 55 to 60%, RVSP elevated to 64.3 mmHg, moderate MR.  She underwent IV diuresis and was discharged on 20 mg daily of Lasix.  She was also noted to be in persistent atrial fibrillation which was rate controlled on metoprolol.    Patient presented to the ED on 06/04/2021 with report of shortness of breath, weakness and leg pain.  BNP was mildly elevated.  Troponin was normal.  Creatinine was 1.14.  Sodium borderline low at 132.  Potassium normal.  White blood cell count normal.  Chest x-ray showed no acute process.   She takes an extra dose of Lasix in the afternoon if she is started having increasing shortness of breath.  She uses oxygen at night and as needed during the day.  Pacemaker check in October showed normal histograms.  Interview today was conducted with the help of a sign  language interpreter. She is doing well overall. Still uses oxygen on occasion. Notes some SOB with walking. No claudication. Weight is down but she states she eats a lot. No chest pain. Sometimes off balance but no dizziness.   Past Medical History:  Diagnosis Date   Carotid bruit    CHF (congestive heart failure) (HCC)    Deafness    Diabetes mellitus    type 2   Diastolic heart failure (HCC)    EF is 60%   GERD (gastroesophageal reflux disease)    Glaucoma    Hypercholesterolemia    Hypertension    Hypothyroidism    Normal cardiac stress test January 2014   PAD (peripheral artery disease) ([employer])    a. known bilateral popliteal occlusions with conservative management favored.   PAT (paroxysmal atrial tachycardia) (HCC)    Sick sinus syndrome [facility])    a. s/p PPM placement 07/2014    Past Surgical History:  Procedure Laterality Date   cholycystectomy     left breast cyst removed     PACEMAKER INSERTION  07-26-2014   MDT ADDRL1 pacemaker implanted by Dr Lovena Le for [place] N/A 07/26/2014   Procedure: PERMANENT PACEMAKER INSERTION;  Surgeon: Evans Lance, MD;  Location[facility] C[facility] LAB;  Service: Cardiovascular;  Laterality: N/A;   TOTAL KNEE ARTHROPLASTY     right  Current Medications: Current Meds  Medication Sig   acetaminophen (TYLENOL) 500 MG tablet Take 1,000 mg by mouth every 6 (six) hours as needed for moderate pain.   apixaban (ELIQUIS) 2.5 MG TABS tablet Take 1 tablet (2.5 mg total) by mouth 2 (two) times daily.   atorvastatin (LIPITOR) 80 MG tablet Take 1 tablet (80 mg total) by mouth at bedtime.   atropine 1 % ophthalmic solution Place 1 drop into the left eye at bedtime.    brimonidine (ALPHAGAN) 0.2 % ophthalmic solution Place 1 drop into both eyes daily.   calcium-vitamin D (OSCAL WITH D) 500-200 MG-UNIT per tablet Take 1 tablet by mouth daily with breakfast.   cholecalciferol (VITAMIN D) 1000 UNITS tablet Take 1,000 Units by mouth  daily.   citalopram (CELEXA) 20 MG tablet Take 20 mg by mouth at bedtime.    dorzolamide-timolol (COSOPT) 22.3-6.8 MG/ML ophthalmic solution Place 1 drop into both eyes 2 (two) times daily.    fluorometholone (FML) 0.1 % ophthalmic suspension Place 1 drop into the left eye 4 (four) times daily.   furosemide (LASIX) 40 MG tablet TAKE 1 TABLET EVERY DAY   gabapentin (NEURONTIN) 100 MG capsule Take 100 mg by mouth daily.    hydrALAZINE (APRESOLINE) 50 MG tablet Take 1 tablet (50 mg total) by mouth daily.   latanoprost (XALATAN) 0.005 % ophthalmic solution Place 1 drop into both eyes in the morning and at bedtime.    levothyroxine (SYNTHROID, LEVOTHROID) 112 MCG tablet Take 112 mcg by mouth daily before breakfast.    metoprolol tartrate (LOPRESSOR) 25 MG tablet Take 1 tablet (25 mg total) by mouth 2 (two) times daily.   OXYGEN Inhale into the lungs. 2 liters as needed   RHOPRESSA 0.02 % SOLN Place 1 drop into the left eye at bedtime.     Allergies:   Amlodipine and Benicar [olmesartan medoxomil]   Social History   Socioeconomic History   Marital status: Widowed    Spouse name: Not on file   Number of children: 4   Years of education: Not on file   Highest education level: Not on file  Occupational History   Not on file  Tobacco Use   Smoking status: Never   Smokeless tobacco: Never  Substance and Sexual Activity   Alcohol use: No   Drug use: No   Sexual activity: Never  Other Topics Concern   Not on file  Social History Narrative   Not on file   Social Determinants of Health   Financial Resource Strain: Not on file  Food Insecurity: Not on file  Transportation Needs: Not on file  Physical Activity: Not on file  Stress: Not on file  Social Connections: Not on file     Family History: The patient's family history includes Heart disease in her brother, mother, and sister; Liver cancer in her father; Stroke in her sister.  ROS:   Please see the history of present illness.      All other systems reviewed and are negative.  EKGs/Labs/Other Studies Reviewed:    The following studies were reviewed today:  Echo 04/03/2020  1. Left ventricular ejection fraction, by estimation, is 55 to 60%. The  left ventricle has normal function. The left ventricle has no regional  wall motion abnormalities. There is mild concentric left ventricular  hypertrophy. Left ventricular diastolic  parameters are indeterminate.   2. Right ventricular systolic function is normal. The right ventricular  size is normal. There is severely elevated pulmonary artery  systolic  pressure. The estimated right ventricular systolic pressure is 09.8 mmHg.   3. Right atrial size was mildly dilated.   4. The mitral valve is normal in structure. Mild mitral valve  regurgitation. No evidence of mitral stenosis.   5. The aortic valve is tricuspid. Aortic valve regurgitation is trivial.  No aortic stenosis is present.   6. The inferior vena cava is dilated in size with <50% respiratory  variability, suggesting right atrial pressure of 15 mmHg.   EKG:  EKG is not ordered today.    Recent Labs: 11/16/2020: Magnesium 2.1 06/04/2021: B Natriuretic Peptide 414.8; BUN 27; Creatinine, Ser 1.14; Hemoglobin 12.8; Platelets 145; Potassium 3.7; Sodium 132  Recent Lipid Panel    Component Value Date/Time   CHOL 141 04/04/2020 0354   TRIG 79 04/04/2020 0354   HDL 33 (L) 04/04/2020 0354   CHOLHDL 4.3 04/04/2020 0354   VLDL 16 04/04/2020 0354   LDLCALC 92 04/04/2020 0354     Risk Assessment/Calculations:    CHA2DS2-VASc Score = 6  This indicates a 9.7% annual risk of stroke. The patient's score is based upon: CHF History: 1 HTN History: 1 Diabetes History: 1 Stroke History: 0 Vascular Disease History: 0 Age Score: 2 Gender Score: 1         Physical Exam:    VS:  BP 125/74   Pulse 75   Ht 5\' 5"  (1.651 m)   Wt 123 lb 6.4 oz (56 kg)   SpO2 90%   BMI 20.53 kg/m     Wt Readings from Last 3  Encounters:  11/13/21 123 lb 6.4 oz (56 kg)  06/27/21 127 lb 3.2 oz (57.7 kg)  06/04/21 125 lb (56.7 kg)     GEN:  Well nourished, well developed in no acute distress HEENT: Normal NECK: No JVD; No carotid bruits LYMPHATICS: No lymphadenopathy CARDIAC: Irregularly irregular, no murmurs, rubs, gallops RESPIRATORY:  Clear to auscultation without rales, wheezing or rhonchi  ABDOMEN: Soft, non-tender, non-distended MUSCULOSKELETAL:  No edema; No deformity  SKIN: Warm and dry NEUROLOGIC:  Alert and oriented x 3 PSYCHIATRIC:  Normal affect   ASSESSMENT:    1. PAD (peripheral artery disease) (HCC)   2. Persistent atrial fibrillation ([place])   3. Chronic diastolic CHF (congestive heart failure) ([place])   4. Sinus node dysfunction (HCC)   5. Pacemaker   6. Essential hypertension     PLAN:    In order of problems listed above:  Persistent atrial fibrillation: Continue with rate control strategy.  On Eliquis 2.5 mg twice a day due to weight and age. Also on metoprolol. Asymptomatic.   Hypertension: Blood pressure well controlled on current therapy.  Hyperlipidemia: On Lipitor  DM2: Managed by primary care provider  History of pacemaker: Followed by EP service. Pacemaker check last month was normal.   Chronic diastolic heart failure: Euvolemic on physical exam.     Follow up in 6 months.   Medication Adjustments/Labs and Tests Ordered: Current medicines are reviewed at length with the patient today.  Concerns regarding medicines are outlined above.  No orders of the defined types were placed in this encounter.  No orders of the defined types were placed in this encounter.   There are no Patient Instructions on file for this visit.   Signed, Mirinda Monte [name], MD  11/13/2021 2:02 PM    [place]

## 2021-11-13 ENCOUNTER — Encounter: Payer: Self-pay | Admitting: Cardiology

## 2021-11-13 ENCOUNTER — Ambulatory Visit (INDEPENDENT_AMBULATORY_CARE_PROVIDER_SITE_OTHER): Payer: Medicare HMO | Admitting: Cardiology

## 2021-11-13 ENCOUNTER — Other Ambulatory Visit: Payer: Self-pay

## 2021-11-13 VITALS — BP 125/74 | HR 75 | Ht 65.0 in | Wt 123.4 lb

## 2021-11-13 DIAGNOSIS — I495 Sick sinus syndrome: Secondary | ICD-10-CM

## 2021-11-13 DIAGNOSIS — I5032 Chronic diastolic (congestive) heart failure: Secondary | ICD-10-CM

## 2021-11-13 DIAGNOSIS — I10 Essential (primary) hypertension: Secondary | ICD-10-CM

## 2021-11-13 DIAGNOSIS — I4819 Other persistent atrial fibrillation: Secondary | ICD-10-CM

## 2021-11-13 DIAGNOSIS — I739 Peripheral vascular disease, unspecified: Secondary | ICD-10-CM | POA: Diagnosis not present

## 2021-11-13 DIAGNOSIS — Z950 Presence of cardiac pacemaker: Secondary | ICD-10-CM

## 2021-11-16 ENCOUNTER — Other Ambulatory Visit: Payer: Self-pay | Admitting: Cardiology

## 2021-11-17 ENCOUNTER — Other Ambulatory Visit: Payer: Self-pay | Admitting: Cardiology

## 2021-11-22 ENCOUNTER — Telehealth: Payer: Self-pay | Admitting: Cardiology

## 2021-11-22 NOTE — Telephone Encounter (Signed)
Patient's daughter calling to speak with Malachy Mood in regards to her mother's medication. She would like to go over all of her medications and make sure that her mother is taking them correctly. Please advise.

## 2021-11-22 NOTE — Telephone Encounter (Signed)
Spoke to patient's daughter [name] she wanted to verify mother's medications.Medications reviewed.

## 2022-01-08 ENCOUNTER — Encounter (HOSPITAL_BASED_OUTPATIENT_CLINIC_OR_DEPARTMENT_OTHER): Payer: Self-pay | Admitting: *Deleted

## 2022-01-08 ENCOUNTER — Telehealth: Payer: Self-pay | Admitting: Cardiology

## 2022-01-08 ENCOUNTER — Other Ambulatory Visit: Payer: Self-pay

## 2022-01-08 ENCOUNTER — Emergency Department (HOSPITAL_BASED_OUTPATIENT_CLINIC_OR_DEPARTMENT_OTHER): Payer: Medicare HMO

## 2022-01-08 ENCOUNTER — Inpatient Hospital Stay (HOSPITAL_BASED_OUTPATIENT_CLINIC_OR_DEPARTMENT_OTHER)
Admission: EM | Admit: 2022-01-08 | Discharge: 2022-01-12 | DRG: 683 | Disposition: A | Discharge status: Home Health | Payer: Medicare HMO | Attending: Internal Medicine | Admitting: Internal Medicine

## 2022-01-08 DIAGNOSIS — R5381 Other malaise: Secondary | ICD-10-CM | POA: Diagnosis present

## 2022-01-08 DIAGNOSIS — Z8249 Family history of ischemic heart disease and other diseases of the circulatory system: Secondary | ICD-10-CM

## 2022-01-08 DIAGNOSIS — H409 Unspecified glaucoma: Secondary | ICD-10-CM | POA: Diagnosis present

## 2022-01-08 DIAGNOSIS — Z79890 Hormone replacement therapy (postmenopausal): Secondary | ICD-10-CM

## 2022-01-08 DIAGNOSIS — Z800 Family history of malignant neoplasm of digestive organs: Secondary | ICD-10-CM

## 2022-01-08 DIAGNOSIS — Z7901 Long term (current) use of anticoagulants: Secondary | ICD-10-CM

## 2022-01-08 DIAGNOSIS — E860 Dehydration: Secondary | ICD-10-CM | POA: Diagnosis present

## 2022-01-08 DIAGNOSIS — N1832 Chronic kidney disease, stage 3b: Secondary | ICD-10-CM | POA: Diagnosis present

## 2022-01-08 DIAGNOSIS — N179 Acute kidney failure, unspecified: Secondary | ICD-10-CM | POA: Diagnosis not present

## 2022-01-08 DIAGNOSIS — E039 Hypothyroidism, unspecified: Secondary | ICD-10-CM | POA: Diagnosis present

## 2022-01-08 DIAGNOSIS — I739 Peripheral vascular disease, unspecified: Secondary | ICD-10-CM | POA: Diagnosis present

## 2022-01-08 DIAGNOSIS — R112 Nausea with vomiting, unspecified: Secondary | ICD-10-CM | POA: Diagnosis present

## 2022-01-08 DIAGNOSIS — J9811 Atelectasis: Secondary | ICD-10-CM | POA: Diagnosis present

## 2022-01-08 DIAGNOSIS — K219 Gastro-esophageal reflux disease without esophagitis: Secondary | ICD-10-CM | POA: Diagnosis present

## 2022-01-08 DIAGNOSIS — D696 Thrombocytopenia, unspecified: Secondary | ICD-10-CM | POA: Diagnosis present

## 2022-01-08 DIAGNOSIS — I480 Paroxysmal atrial fibrillation: Secondary | ICD-10-CM | POA: Diagnosis present

## 2022-01-08 DIAGNOSIS — D631 Anemia in chronic kidney disease: Secondary | ICD-10-CM | POA: Diagnosis present

## 2022-01-08 DIAGNOSIS — E871 Hypo-osmolality and hyponatremia: Secondary | ICD-10-CM | POA: Diagnosis present

## 2022-01-08 DIAGNOSIS — I10 Essential (primary) hypertension: Secondary | ICD-10-CM | POA: Diagnosis present

## 2022-01-08 DIAGNOSIS — R7401 Elevation of levels of liver transaminase levels: Secondary | ICD-10-CM | POA: Diagnosis present

## 2022-01-08 DIAGNOSIS — R1110 Vomiting, unspecified: Secondary | ICD-10-CM | POA: Diagnosis not present

## 2022-01-08 DIAGNOSIS — Z20822 Contact with and (suspected) exposure to covid-19: Secondary | ICD-10-CM | POA: Diagnosis present

## 2022-01-08 DIAGNOSIS — E7800 Pure hypercholesterolemia, unspecified: Secondary | ICD-10-CM | POA: Diagnosis present

## 2022-01-08 DIAGNOSIS — F419 Anxiety disorder, unspecified: Secondary | ICD-10-CM | POA: Diagnosis present

## 2022-01-08 DIAGNOSIS — E861 Hypovolemia: Secondary | ICD-10-CM | POA: Diagnosis present

## 2022-01-08 DIAGNOSIS — Z9981 Dependence on supplemental oxygen: Secondary | ICD-10-CM

## 2022-01-08 DIAGNOSIS — E876 Hypokalemia: Secondary | ICD-10-CM | POA: Diagnosis not present

## 2022-01-08 DIAGNOSIS — H9190 Unspecified hearing loss, unspecified ear: Secondary | ICD-10-CM

## 2022-01-08 DIAGNOSIS — Z79899 Other long term (current) drug therapy: Secondary | ICD-10-CM

## 2022-01-08 DIAGNOSIS — I495 Sick sinus syndrome: Secondary | ICD-10-CM | POA: Diagnosis present

## 2022-01-08 DIAGNOSIS — I4819 Other persistent atrial fibrillation: Secondary | ICD-10-CM | POA: Diagnosis present

## 2022-01-08 DIAGNOSIS — H905 Unspecified sensorineural hearing loss: Secondary | ICD-10-CM | POA: Diagnosis present

## 2022-01-08 DIAGNOSIS — I959 Hypotension, unspecified: Secondary | ICD-10-CM | POA: Diagnosis present

## 2022-01-08 DIAGNOSIS — R197 Diarrhea, unspecified: Secondary | ICD-10-CM | POA: Diagnosis present

## 2022-01-08 DIAGNOSIS — Z823 Family history of stroke: Secondary | ICD-10-CM

## 2022-01-08 DIAGNOSIS — I130 Hypertensive heart and chronic kidney disease with heart failure and stage 1 through stage 4 chronic kidney disease, or unspecified chronic kidney disease: Secondary | ICD-10-CM | POA: Diagnosis present

## 2022-01-08 DIAGNOSIS — Z950 Presence of cardiac pacemaker: Secondary | ICD-10-CM

## 2022-01-08 DIAGNOSIS — J9611 Chronic respiratory failure with hypoxia: Secondary | ICD-10-CM | POA: Diagnosis present

## 2022-01-08 DIAGNOSIS — I2510 Atherosclerotic heart disease of native coronary artery without angina pectoris: Secondary | ICD-10-CM | POA: Diagnosis present

## 2022-01-08 DIAGNOSIS — I5032 Chronic diastolic (congestive) heart failure: Secondary | ICD-10-CM | POA: Diagnosis present

## 2022-01-08 DIAGNOSIS — E440 Moderate protein-calorie malnutrition: Secondary | ICD-10-CM | POA: Insufficient documentation

## 2022-01-08 LAB — CBC WITH DIFFERENTIAL/PLATELET
Abs Immature Granulocytes: 0.03 10*3/uL (ref 0.00–0.07)
Basophils Absolute: 0 10*3/uL (ref 0.0–0.1)
Basophils Relative: 1 %
Eosinophils Absolute: 0.1 10*3/uL (ref 0.0–0.5)
Eosinophils Relative: 2 %
HCT: 38.9 % (ref 36.0–46.0)
Hemoglobin: 12.6 g/dL (ref 12.0–15.0)
Immature Granulocytes: 1 %
Lymphocytes Relative: 19 %
Lymphs Abs: 1.2 10*3/uL (ref 0.7–4.0)
MCH: 27.8 pg (ref 26.0–34.0)
MCHC: 32.4 g/dL (ref 30.0–36.0)
MCV: 85.7 fL (ref 80.0–100.0)
Monocytes Absolute: 0.7 10*3/uL (ref 0.1–1.0)
Monocytes Relative: 11 %
Neutro Abs: 4.2 10*3/uL (ref 1.7–7.7)
Neutrophils Relative %: 66 %
Platelets: 132 10*3/uL — ABNORMAL LOW (ref 150–400)
RBC: 4.54 MIL/uL (ref 3.87–5.11)
RDW: 20.1 % — ABNORMAL HIGH (ref 11.5–15.5)
WBC: 6.3 10*3/uL (ref 4.0–10.5)
nRBC: 0 % (ref 0.0–0.2)

## 2022-01-08 LAB — URINALYSIS, ROUTINE W REFLEX MICROSCOPIC
Bilirubin Urine: NEGATIVE
Glucose, UA: NEGATIVE mg/dL
Ketones, ur: NEGATIVE mg/dL
Leukocytes,Ua: NEGATIVE
Nitrite: NEGATIVE
Protein, ur: 30 mg/dL — AB
Specific Gravity, Urine: <1.005 — ABNORMAL LOW (ref 1.005–1.030)
pH: 5.5 (ref 5.0–8.0)

## 2022-01-08 LAB — COMPREHENSIVE METABOLIC PANEL
ALT: 174 U/L — ABNORMAL HIGH (ref 0–44)
AST: 340 U/L — ABNORMAL HIGH (ref 15–41)
Albumin: 3.5 g/dL (ref 3.5–5.0)
Alkaline Phosphatase: 156 U/L — ABNORMAL HIGH (ref 38–126)
Anion gap: 12 (ref 5–15)
BUN: 33 mg/dL — ABNORMAL HIGH (ref 8–23)
CO2: 23 mmol/L (ref 22–32)
Calcium: 8.8 mg/dL — ABNORMAL LOW (ref 8.9–10.3)
Chloride: 93 mmol/L — ABNORMAL LOW (ref 98–111)
Creatinine, Ser: 2.1 mg/dL — ABNORMAL HIGH (ref 0.44–1.00)
GFR, Estimated: 23 mL/min — ABNORMAL LOW (ref >60)
Glucose, Bld: 123 mg/dL — ABNORMAL HIGH (ref 70–99)
Potassium: 3.6 mmol/L (ref 3.5–5.1)
Sodium: 128 mmol/L — ABNORMAL LOW (ref 135–145)
Total Bilirubin: 2 mg/dL — ABNORMAL HIGH (ref 0.3–1.2)
Total Protein: 6.9 g/dL (ref 6.5–8.1)

## 2022-01-08 LAB — URINALYSIS, MICROSCOPIC (REFLEX)

## 2022-01-08 LAB — BRAIN NATRIURETIC PEPTIDE: B Natriuretic Peptide: 942.1 pg/mL — ABNORMAL HIGH (ref 0.0–100.0)

## 2022-01-08 LAB — RESP PANEL BY RT-PCR (FLU A&B, COVID) ARPGX2
Influenza A by PCR: NEGATIVE
Influenza B by PCR: NEGATIVE
SARS Coronavirus 2 by RT PCR: NEGATIVE

## 2022-01-08 LAB — LIPASE, BLOOD: Lipase: 54 U/L — ABNORMAL HIGH (ref 11–51)

## 2022-01-08 MED ORDER — LACTATED RINGERS IV SOLN: INTRAVENOUS | Status: DC

## 2022-01-08 MED ORDER — HYDRALAZINE HCL 50 MG PO TABS: 50.0000 mg | ORAL_TABLET | Freq: Every day | ORAL | 3 refills | Status: DC

## 2022-01-08 MED ORDER — SODIUM CHLORIDE 0.9 % IV BOLUS
500.0000 mL | Freq: Once | INTRAVENOUS | Status: AC
  Administered 2022-01-08: 500 mL via INTRAVENOUS

## 2022-01-08 MED ORDER — ONDANSETRON HCL 4 MG/2ML IJ SOLN
4.0000 mg | Freq: Once | INTRAMUSCULAR | Status: AC
  Administered 2022-01-08: 4 mg via INTRAVENOUS
  Filled 2022-01-08: qty 2

## 2022-01-08 NOTE — ED Triage Notes (Signed)
C/o n/v/d x 1 week, seen by PMD yesterday for same , labs done , covid neg , O2 sat in triage 83 , pt uses home o2

## 2022-01-08 NOTE — Telephone Encounter (Signed)
Spoke to patient daughter. Patient saw Dr Doreene Nest yesterday -  labs were done Covid, CBC BNP. Covid was negative , per  office note BNP 497.6  Daughter states patient has been weak , having diarrhea for 4-5 days.  Today no diarrhea , but have been nausea and vomiting.   Oxygenlevel has been good no number given ,once she has been on her on oxygen concentrator.   Per [name],  she states Dr Doreene Nest wanted patient to be seen by Dr [name] - to see if lungs were full. Per daughter , only labs were an obtained no x-rays. Daughter states  Dr Doreene Nest  instructed patient to got to ER if symptoms got worse. Aware will defer to Dr [name]

## 2022-01-08 NOTE — Telephone Encounter (Signed)
Patient's daughter is requesting to speak with Dr. Doug Sou nurse. She states the patient has not been feeling well for the past week. She has been experiencing diarrhea and nausea. Her oxygen was also low yesterday when she saw PCP, Dr. Doreene Nest. Patient had lab work and PCP advised patient to follow up with cardiology to ensure that lungs are not full of fluid.

## 2022-01-08 NOTE — Telephone Encounter (Signed)
Her BNP is about at her baseline. Unless she is having worsening dyspnea or evidence of increased edema I wouldn't be concerned. If she is having N/V and diarrhea I would be more concerned that she maintain good hydration.   Padraic Marinos [name] MD, [facility]

## 2022-01-08 NOTE — Telephone Encounter (Signed)
Spoke to patient's daughter [name] stated she just spoke to PCP and was advised to take mother to ED for IV fluids.I will make Dr.Jordan aware.

## 2022-01-08 NOTE — ED Provider Notes (Incomplete)
11:52 PM Assumed care from Dr. Alvino Chapel, please see their note for full history, physical and decision making until this point. In brief this is a 88 y.o. year old female who presented to the ED tonight with Vomiting     Vomiting. GI bug. Clinically dehydrated but with JVD. Getting fluids but BNP/creatinine up. Wanting observation while getting fluids and ensuring no respiratory changes but is already increased her O2.   Discharge instructions, including strict return precautions for new or worsening symptoms, given. Patient and/or family verbalized understanding and agreement with the plan as described.   Labs, studies and imaging reviewed by myself and considered in medical decision making if ordered. Imaging interpreted by radiology.  Labs Reviewed  COMPREHENSIVE METABOLIC PANEL - Abnormal; Notable for the following components:      Result Value   Sodium 128 (*)    Chloride 93 (*)    Glucose, Bld 123 (*)    BUN 33 (*)    Creatinine, Ser 2.10 (*)    Calcium 8.8 (*)    AST 340 (*)    ALT 174 (*)    Alkaline Phosphatase 156 (*)    Total Bilirubin 2.0 (*)    GFR, Estimated 23 (*)    All other components within normal limits  LIPASE, BLOOD - Abnormal; Notable for the following components:   Lipase 54 (*)    All other components within normal limits  CBC WITH DIFFERENTIAL/PLATELET - Abnormal; Notable for the following components:   RDW 20.1 (*)    Platelets 132 (*)    All other components within normal limits  URINALYSIS, ROUTINE W REFLEX MICROSCOPIC - Abnormal; Notable for the following components:   Specific Gravity, Urine <1.005 (*)    Hgb urine dipstick LARGE (*)    Protein, ur 30 (*)    All other components within normal limits  BRAIN NATRIURETIC PEPTIDE - Abnormal; Notable for the following components:   B Natriuretic Peptide 942.1 (*)    All other components within normal limits  URINALYSIS, MICROSCOPIC (REFLEX) - Abnormal; Notable for the following components:    Bacteria, UA FEW (*)    All other components within normal limits  RESP PANEL BY RT-PCR (FLU A&B, COVID) ARPGX2    US Abdomen Limited RUQ (LIVER/GB)  Final Result    DG Abdomen Acute W/Chest  Final Result      No follow-ups on file.

## 2022-01-08 NOTE — ED Notes (Signed)
Placed on 3 liters [place] in triage , o2 95 %

## 2022-01-08 NOTE — ED Provider Notes (Signed)
[facility] Provider Note   CSN: [id number] Arrival date & time: 01/08/22  1923     History  Chief Complaint  Patient presents with   Vomiting    Veronica Young is a 88 y.o. female. Patient is deaf and has been translated by daughter. HPI Patient presents after being sent in by PCP.  Had had about a week of diarrhea with nausea and decreased oral intake.  Uses home O2 as needed but has had to use more.  Sats decreased on room air down to 83.  Has had a dry mouth.  History of CHF.  Has required admission to the hospital in the past.  Weight is down.  No fevers or chills.  Has had some mild abdominal pain.  No known sick contacts.  Negative COVID testing yesterday    Home Medications Prior to Admission medications   Medication Sig Start Date End Date Taking? Authorizing Provider  acetaminophen (TYLENOL) 500 MG tablet Take 1,000 mg by mouth every 6 (six) hours as needed for moderate pain.    [provider]  apixaban (ELIQUIS) 2.5 MG TABS tablet Take 1 tablet (2.5 mg total) by mouth 2 (two) times daily. 11/09/21   [name], Peter M, MD  atorvastatin (LIPITOR) 80 MG tablet Take 1 tablet (80 mg total) by mouth at bedtime. 01/11/21   [name], Peter M, MD  atropine 1 % ophthalmic solution Place 1 drop into the left eye at bedtime.     [provider]  brimonidine (ALPHAGAN) 0.2 % ophthalmic solution Place 1 drop into both eyes daily. 05/20/17   [provider]  calcium-vitamin D (OSCAL WITH D) 500-200 MG-UNIT per tablet Take 1 tablet by mouth daily with breakfast.    [provider]  cholecalciferol (VITAMIN D) 1000 UNITS tablet Take 1,000 Units by mouth daily.    [provider]  citalopram (CELEXA) 20 MG tablet Take 20 mg by mouth at bedtime.  02/08/11   [provider]  dorzolamide-timolol (COSOPT) 22.3-6.8 MG/ML ophthalmic solution Place 1 drop into both eyes 2 (two) times daily.     [provider]   fluorometholone (FML) 0.1 % ophthalmic suspension Place 1 drop into the left eye 4 (four) times daily. 01/17/20   [provider]  furosemide (LASIX) 40 MG tablet TAKE 1 TABLET EVERY DAY 11/19/21   [name], Peter M, MD  gabapentin (NEURONTIN) 100 MG capsule Take 100 mg by mouth daily.  10/24/19   [provider]  hydrALAZINE (APRESOLINE) 50 MG tablet Take 1 tablet (50 mg total) by mouth daily. 01/08/22   [name], Peter M, MD  latanoprost (XALATAN) 0.005 % ophthalmic solution Place 1 drop into both eyes in the morning and at bedtime.  11/25/15   [provider]  levothyroxine (SYNTHROID, LEVOTHROID) 112 MCG tablet Take 112 mcg by mouth daily before breakfast.  03/31/14   [provider]  metoprolol tartrate (LOPRESSOR) 25 MG tablet Take 1 tablet (25 mg total) by mouth 2 (two) times daily. 01/11/21   [name], Peter M, MD  OXYGEN Inhale into the lungs. 2 liters as needed    [provider]  potassium chloride SA (KLOR-CON) 20 MEQ tablet Take 1 tablet (20 mEq total) by mouth daily. 01/12/21 02/11/21  [name], Peter M, MD  RHOPRESSA 0.02 % SOLN Place 1 drop into the left eye at bedtime. 10/28/19   [provider]      Allergies    Amlodipine and Benicar [olmesartan medoxomil]  Review of Systems   Review of Systems  Constitutional:  Negative for fatigue.  HENT:  Negative for congestion.   Respiratory:  Positive for shortness of breath.   Cardiovascular:  Negative for chest pain and leg swelling.  Gastrointestinal:  Positive for diarrhea, nausea and vomiting.  Genitourinary:  Negative for flank pain.  Musculoskeletal:  Negative for back pain.  Skin:  Negative for wound.  Neurological:  Negative for weakness.   Physical Exam Updated Vital Signs BP 124/64 (BP Location: Left Arm)    Pulse 66    Temp 98.2 F (36.8 C) (Oral)    Resp (!) 25    Ht 5\' 6"  (1.676 m)    Wt 53.1 kg    SpO2 95%    BMI 18.88 kg/m  Physical Exam Vitals and nursing note reviewed.   HENT:     Head: Atraumatic.  Neck:     Comments: JVD bilaterally. Cardiovascular:     Rate and Rhythm: Normal rate. Rhythm irregular.  Abdominal:     Comments: Mild abdominal tenderness or rebound or guarding.  No hernias palpated.  Musculoskeletal:        General: No tenderness.     Cervical back: Neck supple.     Comments: Mild edema bilateral lower extremities.  Skin:    Capillary Refill: Capillary refill takes less than 2 seconds.  Neurological:     Mental Status: She is alert and oriented to person, place, and time.    ED Results / Procedures / Treatments   Labs (all labs ordered are listed, but only abnormal results are displayed) Labs Reviewed  COMPREHENSIVE METABOLIC PANEL - Abnormal; Notable for the following components:      Result Value   Sodium 128 (*)    Chloride 93 (*)    Glucose, Bld 123 (*)    BUN 33 (*)    Creatinine, Ser 2.10 (*)    Calcium 8.8 (*)    AST 340 (*)    ALT 174 (*)    Alkaline Phosphatase 156 (*)    Total Bilirubin 2.0 (*)    GFR, Estimated 23 (*)    All other components within normal limits  LIPASE, BLOOD - Abnormal; Notable for the following components:   Lipase 54 (*)    All other components within normal limits  CBC WITH DIFFERENTIAL/PLATELET - Abnormal; Notable for the following components:   RDW 20.1 (*)    Platelets 132 (*)    All other components within normal limits  URINALYSIS, ROUTINE W REFLEX MICROSCOPIC - Abnormal; Notable for the following components:   Specific Gravity, Urine <1.005 (*)    Hgb urine dipstick LARGE (*)    Protein, ur 30 (*)    All other components within normal limits  BRAIN NATRIURETIC PEPTIDE - Abnormal; Notable for the following components:   B Natriuretic Peptide 942.1 (*)    All other components within normal limits  URINALYSIS, MICROSCOPIC (REFLEX) - Abnormal; Notable for the following components:   Bacteria, UA FEW (*)    All other components within normal limits  RESP PANEL BY RT-PCR (FLU  A&B, COVID) ARPGX2    EKG EKG Interpretation  Date/Time:  Tuesday January 08 2022 20:13:17 EST Ventricular Rate:  79 PR Interval:    QRS Duration: 87 QT Interval:  389 QTC Calculation: 446 R Axis:   93 Text Interpretation: Atrial fibrillation Right axis deviation Probable anteroseptal infarct, old Nonspecific repol abnormality, lateral leads Confirmed by Davonna Belling [phone]) on 01/08/2022 11:21:23 PM  Radiology DG Abdomen Acute W/Chest  Result Date: 01/08/2022 CLINICAL DATA:  Shortness of breath and vomiting. EXAM: DG ABDOMEN ACUTE WITH 1 VIEW CHEST COMPARISON:  Chest radiograph dated 06/04/2021. FINDINGS: Diffuse chronic interstitial coarsening. There are bibasilar densities which may represent atelectasis. Developing infiltrate is less likely but not excluded. Blunting of the costophrenic angles may represent trace pleural effusion. No pneumothorax. Mild cardiomegaly. Left pectoral pacemaker device. No bowel dilatation or evidence of obstruction. No free air. Surgical clips in the right flank pain degenerative changes of the spine and scoliosis. No acute osseous pathology. IMPRESSION: 1. Bibasilar atelectasis versus less likely developing infiltrate. 2. Possible trace bilateral pleural effusions. 3. No bowel obstruction. Electronically Signed   By: Anner Crete M.D.   On: 01/08/2022 20:43   US Abdomen Limited RUQ (LIVER/GB)  Result Date: 01/08/2022 CLINICAL DATA:  Emesis EXAM: ULTRASOUND ABDOMEN LIMITED RIGHT UPPER QUADRANT COMPARISON:  None. FINDINGS: Gallbladder: Status post cholecystectomy. Common bile duct: Diameter: 3 mm. Liver: No focal lesion identified. Within normal limits in parenchymal echogenicity. Portal vein is patent on color Doppler imaging with normal direction of blood flow towards the liver. Other: None. IMPRESSION: Unremarkable right upper quadrant ultrasound in a patient status post cholecystectomy. Electronically Signed   By: Iven Finn M.D.   On: 01/08/2022  21:47    Procedures Procedures    Medications Ordered in ED Medications  ondansetron (ZOFRAN) injection 4 mg (has no administration in time range)  lactated ringers infusion (has no administration in time range)  sodium chloride 0.9 % bolus 500 mL ( Intravenous Stopped 01/08/22 2242)    ED Course/ Medical Decision Making/ A&P                           Medical Decision Making Problems Addressed: AKI (acute kidney injury) [facility]): acute illness or injury Vomiting: acute illness or injury  Amount and/or Complexity of Data Reviewed External Data Reviewed: labs and notes. Labs: ordered. Radiology: ordered and independent interpretation performed. ECG/medicine tests: independent interpretation performed.   Patient presents with nausea vomiting some diarrhea.  Said for around a week.  Seen by his PCP.  Patient is likely somewhat volume depleted and that her creatinine is higher and her mouth is dry and has had decreased oral intake, however does have increasing BNP and JVD.  Mild abdominal tenderness.  No distention.  LFTs elevated yesterday and again today.  Ultrasound done and reassuring.  Postcholecystectomy.  Initial fluid bolus given and patient feels little better, however patient sats of been doing worse at home.  Has oxygen as needed at home but has required using it more.  Has a history of CHF and I worry that giving the fluid for hydration with increasing BNP's and JVD already will give her volume overload.  Has acute kidney injury.  Chest x-ray reviewed and independently interpreted.  No obstruction on abdominal x-ray.  Will discuss with hospitalist for admission        Final Clinical Impression(s) / ED Diagnoses Final diagnoses:  Vomiting  AKI (acute kidney injury) ([place])  Transaminitis    Rx / [place] Orders ED Discharge Orders     None         Davonna Belling, MD 01/08/22 2324

## 2022-01-09 DIAGNOSIS — H409 Unspecified glaucoma: Secondary | ICD-10-CM | POA: Diagnosis present

## 2022-01-09 DIAGNOSIS — D631 Anemia in chronic kidney disease: Secondary | ICD-10-CM | POA: Diagnosis present

## 2022-01-09 DIAGNOSIS — E871 Hypo-osmolality and hyponatremia: Secondary | ICD-10-CM | POA: Diagnosis present

## 2022-01-09 DIAGNOSIS — E039 Hypothyroidism, unspecified: Secondary | ICD-10-CM | POA: Diagnosis present

## 2022-01-09 DIAGNOSIS — R197 Diarrhea, unspecified: Secondary | ICD-10-CM | POA: Diagnosis not present

## 2022-01-09 DIAGNOSIS — N1832 Chronic kidney disease, stage 3b: Secondary | ICD-10-CM | POA: Diagnosis present

## 2022-01-09 DIAGNOSIS — Z20822 Contact with and (suspected) exposure to covid-19: Secondary | ICD-10-CM | POA: Diagnosis present

## 2022-01-09 DIAGNOSIS — I2510 Atherosclerotic heart disease of native coronary artery without angina pectoris: Secondary | ICD-10-CM | POA: Diagnosis present

## 2022-01-09 DIAGNOSIS — J9611 Chronic respiratory failure with hypoxia: Secondary | ICD-10-CM | POA: Diagnosis present

## 2022-01-09 DIAGNOSIS — J9811 Atelectasis: Secondary | ICD-10-CM | POA: Diagnosis present

## 2022-01-09 DIAGNOSIS — N179 Acute kidney failure, unspecified: Secondary | ICD-10-CM | POA: Diagnosis present

## 2022-01-09 DIAGNOSIS — I495 Sick sinus syndrome: Secondary | ICD-10-CM | POA: Diagnosis present

## 2022-01-09 DIAGNOSIS — Z950 Presence of cardiac pacemaker: Secondary | ICD-10-CM | POA: Diagnosis not present

## 2022-01-09 DIAGNOSIS — H905 Unspecified sensorineural hearing loss: Secondary | ICD-10-CM | POA: Diagnosis present

## 2022-01-09 DIAGNOSIS — I4819 Other persistent atrial fibrillation: Secondary | ICD-10-CM | POA: Diagnosis present

## 2022-01-09 DIAGNOSIS — R112 Nausea with vomiting, unspecified: Secondary | ICD-10-CM | POA: Insufficient documentation

## 2022-01-09 DIAGNOSIS — E861 Hypovolemia: Secondary | ICD-10-CM | POA: Diagnosis present

## 2022-01-09 DIAGNOSIS — E860 Dehydration: Secondary | ICD-10-CM | POA: Diagnosis present

## 2022-01-09 DIAGNOSIS — H9190 Unspecified hearing loss, unspecified ear: Secondary | ICD-10-CM | POA: Diagnosis not present

## 2022-01-09 DIAGNOSIS — E876 Hypokalemia: Secondary | ICD-10-CM | POA: Diagnosis not present

## 2022-01-09 DIAGNOSIS — R1110 Vomiting, unspecified: Secondary | ICD-10-CM | POA: Diagnosis present

## 2022-01-09 DIAGNOSIS — F419 Anxiety disorder, unspecified: Secondary | ICD-10-CM | POA: Diagnosis present

## 2022-01-09 DIAGNOSIS — I959 Hypotension, unspecified: Secondary | ICD-10-CM | POA: Diagnosis present

## 2022-01-09 DIAGNOSIS — Z7901 Long term (current) use of anticoagulants: Secondary | ICD-10-CM | POA: Diagnosis not present

## 2022-01-09 DIAGNOSIS — D696 Thrombocytopenia, unspecified: Secondary | ICD-10-CM | POA: Diagnosis present

## 2022-01-09 DIAGNOSIS — I5032 Chronic diastolic (congestive) heart failure: Secondary | ICD-10-CM | POA: Diagnosis present

## 2022-01-09 DIAGNOSIS — I130 Hypertensive heart and chronic kidney disease with heart failure and stage 1 through stage 4 chronic kidney disease, or unspecified chronic kidney disease: Secondary | ICD-10-CM | POA: Diagnosis present

## 2022-01-09 DIAGNOSIS — K219 Gastro-esophageal reflux disease without esophagitis: Secondary | ICD-10-CM | POA: Diagnosis present

## 2022-01-09 LAB — SODIUM, URINE, RANDOM: Sodium, Ur: 19 mmol/L

## 2022-01-09 LAB — BILIRUBIN, FRACTIONATED(TOT/DIR/INDIR)
Bilirubin, Direct: 0.5 mg/dL — ABNORMAL HIGH (ref 0.0–0.2)
Indirect Bilirubin: 0.9 mg/dL (ref 0.3–0.9)
Total Bilirubin: 1.4 mg/dL — ABNORMAL HIGH (ref 0.3–1.2)

## 2022-01-09 LAB — RENAL FUNCTION PANEL
Albumin: 3.2 g/dL — ABNORMAL LOW (ref 3.5–5.0)
Anion gap: 9 (ref 5–15)
BUN: 30 mg/dL — ABNORMAL HIGH (ref 8–23)
CO2: 25 mmol/L (ref 22–32)
Calcium: 8.6 mg/dL — ABNORMAL LOW (ref 8.9–10.3)
Chloride: 98 mmol/L (ref 98–111)
Creatinine, Ser: 1.85 mg/dL — ABNORMAL HIGH (ref 0.44–1.00)
GFR, Estimated: 26 mL/min — ABNORMAL LOW (ref >60)
Glucose, Bld: 125 mg/dL — ABNORMAL HIGH (ref 70–99)
Phosphorus: 3.4 mg/dL (ref 2.5–4.6)
Potassium: 3.4 mmol/L — ABNORMAL LOW (ref 3.5–5.1)
Sodium: 132 mmol/L — ABNORMAL LOW (ref 135–145)

## 2022-01-09 LAB — OSMOLALITY, URINE: Osmolality, Ur: 227 mOsm/kg — ABNORMAL LOW (ref 300–900)

## 2022-01-09 MED ORDER — SODIUM CHLORIDE 0.9 % IV SOLN: INTRAVENOUS | Status: DC

## 2022-01-09 MED ORDER — PROCHLORPERAZINE EDISYLATE 10 MG/2ML IJ SOLN: 10.0000 mg | Freq: Four times a day (QID) | INTRAMUSCULAR | Status: DC | PRN

## 2022-01-09 MED ORDER — LATANOPROST 0.005 % OP SOLN
1.0000 [drp] | Freq: Every day | OPHTHALMIC | Status: DC
  Administered 2022-01-09 – 2022-01-11 (×3): 1 [drp] via OPHTHALMIC
  Filled 2022-01-09: qty 2.5

## 2022-01-09 MED ORDER — ATROPINE SULFATE 1 % OP SOLN
1.0000 [drp] | Freq: Every day | OPHTHALMIC | Status: DC
  Administered 2022-01-09 – 2022-01-11 (×3): 1 [drp] via OPHTHALMIC
  Filled 2022-01-09: qty 2

## 2022-01-09 MED ORDER — LEVOTHYROXINE SODIUM 112 MCG PO TABS
112.0000 ug | ORAL_TABLET | Freq: Every day | ORAL | Status: DC
  Administered 2022-01-10 – 2022-01-12 (×3): 112 ug via ORAL
  Filled 2022-01-09 (×3): qty 1

## 2022-01-09 MED ORDER — BRIMONIDINE TARTRATE 0.2 % OP SOLN
1.0000 [drp] | Freq: Two times a day (BID) | OPHTHALMIC | Status: DC
  Administered 2022-01-09 – 2022-01-12 (×6): 1 [drp] via OPHTHALMIC
  Filled 2022-01-09: qty 5

## 2022-01-09 MED ORDER — ESCITALOPRAM OXALATE 10 MG PO TABS
10.0000 mg | ORAL_TABLET | Freq: Every day | ORAL | Status: DC
  Administered 2022-01-10 – 2022-01-12 (×3): 10 mg via ORAL
  Filled 2022-01-09 (×3): qty 1

## 2022-01-09 MED ORDER — NETARSUDIL DIMESYLATE 0.02 % OP SOLN: 1.0000 [drp] | Freq: Every day | OPHTHALMIC | Status: DC

## 2022-01-09 MED ORDER — GABAPENTIN 100 MG PO CAPS
100.0000 mg | ORAL_CAPSULE | Freq: Every day | ORAL | Status: DC
  Administered 2022-01-09 – 2022-01-12 (×4): 100 mg via ORAL
  Filled 2022-01-09 (×4): qty 1

## 2022-01-09 MED ORDER — APIXABAN 2.5 MG PO TABS
2.5000 mg | ORAL_TABLET | Freq: Two times a day (BID) | ORAL | Status: DC
  Administered 2022-01-09 – 2022-01-12 (×6): 2.5 mg via ORAL
  Filled 2022-01-09 (×6): qty 1

## 2022-01-09 MED ORDER — FLUOROMETHOLONE 0.1 % OP SUSP
1.0000 [drp] | Freq: Four times a day (QID) | OPHTHALMIC | Status: DC
  Administered 2022-01-09 – 2022-01-12 (×11): 1 [drp] via OPHTHALMIC
  Filled 2022-01-09: qty 5

## 2022-01-09 NOTE — ED Notes (Signed)
Offered patient breakfast she requested I call family and ask if they will bring her something.

## 2022-01-09 NOTE — ED Notes (Signed)
Pt eating breakfast brought in by family, comfortable in bed, denies any needs at this time.

## 2022-01-09 NOTE — ED Notes (Signed)
Report given to [facility]  

## 2022-01-09 NOTE — ED Notes (Signed)
Daughter brought her lunch  She ate chicken soup and crackers

## 2022-01-09 NOTE — H&P (Signed)
History and Physical    Veronica Young MVE:[id number] DOB: May 17, 1933 DOA: 01/08/2022  PCP: Katherina Mires, MD  Patient coming from: Home  Chief Complaint: diarrhea, fatigue  HPI: Veronica Young is a 88 y.o. female with medical history significant of afib on eliquis, SSS s/p PPM, PAD, CKD3a, hypothyroidism, HLD, HTN, chronic diastolic HF, congenital deafness. Presenting w/ nausea and diarrhea. Symptoms started a week ago. No one else in her home has similar symptoms. Her appetite was worsened during this time. She has no taste and feels nauseous. She has not tried any medications for her symptoms. She spoke with her PCP about her situation 2 days ago in clinic. At that time, it was recommended that she come to the ED for evaluation. She declined at the time and went home. However, her symptoms did not improve yesterday. She felt more fatigued, so she came to the ED for help.   ED Course: She was noted to be in AKI. She was given fluids and anti-emetics. TRH was called for admission.   Review of Systems:  Denies CP, palpitations, dyspnea, abdominal pain, fevers, sick contacts. Review of systems is otherwise negative for all not mentioned in HPI.   PMHx Past Medical History:  Diagnosis Date   Carotid bruit    CHF (congestive heart failure) (HCC)    Deafness    Diabetes mellitus    type 2   Diastolic heart failure (HCC)    EF is 60%   GERD (gastroesophageal reflux disease)    Glaucoma    Hypercholesterolemia    Hypertension    Hypothyroidism    Normal cardiac stress test January 2014   PAD (peripheral artery disease) ([place])    a. known bilateral popliteal occlusions with conservative management favored.   PAT (paroxysmal atrial tachycardia) (HCC)    Sick sinus syndrome [facility])    a. s/p PPM placement 07/2014    PSHx Past Surgical History:  Procedure Laterality Date   cholycystectomy     left breast cyst removed     PACEMAKER INSERTION  07-26-2014   MDT ADDRL1 pacemaker implanted  by Dr Lovena Le for [place] N/A 07/26/2014   Procedure: PERMANENT PACEMAKER INSERTION;  Surgeon: Evans Lance, MD;  Location: [facility] CATH LAB;  Service: Cardiovascular;  Laterality: N/A;   TOTAL KNEE ARTHROPLASTY     right    SocHx  reports that she has never smoked. She has never used smokeless tobacco. She reports that she does not drink alcohol and does not use drugs.  Allergies  Allergen Reactions   Amlodipine Nausea Only        Benicar [Olmesartan Medoxomil] Nausea Only    FamHx Family History  Problem Relation Age of Onset   Heart disease Mother    Liver cancer Father    Heart disease Sister    Heart disease Brother    Stroke Sister     Prior to Admission medications   Medication Sig Start Date End Date Taking? Authorizing Provider  acetaminophen (TYLENOL) 500 MG tablet Take 1,000 mg by mouth every 6 (six) hours as needed for moderate pain.   Yes [provider]  apixaban (ELIQUIS) 2.5 MG TABS tablet Take 1 tablet (2.5 mg total) by mouth 2 (two) times daily. 11/09/21  Yes [name], Peter M, MD  atorvastatin (LIPITOR) 80 MG tablet Take 1 tablet (80 mg total) by mouth at bedtime. 01/11/21  Yes [name], Peter M, MD  atropine 1 % ophthalmic solution Place  1 drop into the left eye at bedtime.    Yes [provider]  brimonidine (ALPHAGAN) 0.2 % ophthalmic solution Place 1 drop into both eyes 2 (two) times daily. 05/20/17  Yes [provider]  calcium-vitamin D (OSCAL WITH D) 500-200 MG-UNIT per tablet Take 1 tablet by mouth daily with breakfast.   Yes [provider]  cholecalciferol (VITAMIN D) 1000 UNITS tablet Take 1,000 Units by mouth daily.   Yes [provider]  escitalopram (LEXAPRO) 10 MG tablet Take 10 mg by mouth daily. 11/19/21  Yes [provider]  fluorometholone (FML) 0.1 % ophthalmic suspension Place 1 drop into the left eye 4 (four) times daily. 01/17/20  Yes [provider]   furosemide (LASIX) 40 MG tablet TAKE 1 TABLET EVERY DAY Patient taking differently: Take 40 mg by mouth daily. 11/19/21  Yes [name], Peter M, MD  gabapentin (NEURONTIN) 100 MG capsule Take 100 mg by mouth daily.  10/24/19  Yes [provider]  hydrALAZINE (APRESOLINE) 50 MG tablet Take 1 tablet (50 mg total) by mouth daily. 01/08/22  Yes [name], Peter M, MD  latanoprost (XALATAN) 0.005 % ophthalmic solution Place 1 drop into both eyes in the morning and at bedtime.  11/25/15  Yes [provider]  levothyroxine (SYNTHROID, LEVOTHROID) 112 MCG tablet Take 112 mcg by mouth daily before breakfast.  03/31/14  Yes [provider]  metoprolol tartrate (LOPRESSOR) 25 MG tablet Take 1 tablet (25 mg total) by mouth 2 (two) times daily. 01/11/21  Yes [name], Peter M, MD  ondansetron (ZOFRAN) 4 MG tablet Take 4 mg by mouth daily as needed for nausea. 01/07/22 01/07/23 Yes [provider]  OXYGEN Inhale into the lungs. 2 liters as needed   Yes [provider]  RHOPRESSA 0.02 % SOLN Place 1 drop into the left eye at bedtime. 10/28/19  Yes [provider]  potassium chloride SA (KLOR-CON) 20 MEQ tablet Take 1 tablet (20 mEq total) by mouth daily. Patient not taking: Reported on 01/09/2022 01/12/21 01/09/22  [name], Peter M, MD    Physical Exam: Vitals:   01/09/22 0815 01/09/22 1100 01/09/22 1447 01/09/22 1500  BP:  106/64 (!) 91/48 (!) 89/50  Pulse: 64 63 100   Resp: 20 (!) 24 (!) 22   Temp:    97.7 F (36.5 C)  TempSrc:   Oral   SpO2: 96% 99% 100%   Weight:      Height:        General: 88 y.o. female resting in bed in NAD Eyes: normal sclera ENMT: Nares patent w/o discharge, orophaynx clear, dentition normal, ears w/o discharge/lesions/ulcers Neck: Supple, trachea midline Cardiovascular: RRR, +S1, S2, no m/g/r, equal pulses throughout Respiratory: CTABL, no w/r/r, normal WOB GI: BS+, NDNT, no masses noted, no organomegaly noted MSK: No e/c/c Neuro:  A&O x 3, symmetric msk strg BUE/BLE, deaf Psyc: Appropriate interaction and affect, calm/cooperative  Labs on Admission: I have personally reviewed following labs and imaging studies  CBC: Recent Labs  Lab 01/08/22 2002  WBC 6.3  NEUTROABS 4.2  HGB 12.6  HCT 38.9  MCV 85.7  PLT 202*   Basic Metabolic Panel: Recent Labs  Lab 01/08/22 2002  NA 128*  K 3.6  CL 93*  CO2 23  GLUCOSE 123*  BUN 33*  CREATININE 2.10*  CALCIUM 8.8*   GFR: Estimated Creatinine Clearance: 16.1 mL/min (A) (by C-G formula based on SCr of 2.1 mg/dL (H)). Liver Function Tests: Recent Labs  Lab 01/08/22  2002  AST 340*  ALT 174*  ALKPHOS 156*  BILITOT 2.0*  PROT 6.9  ALBUMIN 3.5   Recent Labs  Lab 01/08/22 2002  LIPASE 54*   No results for input(s): AMMONIA in the last 168 hours. Coagulation Profile: No results for input(s): INR, PROTIME in the last 168 hours. Cardiac Enzymes: No results for input(s): CKTOTAL, CKMB, CKMBINDEX, TROPONINI in the last 168 hours. BNP (last 3 results) No results for input(s): PROBNP in the last 8760 hours. HbA1C: No results for input(s): HGBA1C in the last 72 hours. CBG: No results for input(s): GLUCAP in the last 168 hours. Lipid Profile: No results for input(s): CHOL, HDL, LDLCALC, TRIG, CHOLHDL, LDLDIRECT in the last 72 hours. Thyroid Function Tests: No results for input(s): TSH, T4TOTAL, FREET4, T3FREE, THYROIDAB in the last 72 hours. Anemia Panel: No results for input(s): VITAMINB12, FOLATE, FERRITIN, TIBC, IRON, RETICCTPCT in the last 72 hours. Urine analysis:    Component Value Date/Time   COLORURINE YELLOW 01/08/2022 2230   APPEARANCEUR CLEAR 01/08/2022 2230   LABSPEC <1.005 (L) 01/08/2022 2230   PHURINE 5.5 01/08/2022 2230   GLUCOSEU NEGATIVE 01/08/2022 2230   HGBUR LARGE (A) 01/08/2022 2230   BILIRUBINUR NEGATIVE 01/08/2022 2230   KETONESUR NEGATIVE 01/08/2022 2230   PROTEINUR 30 (A) 01/08/2022 2230   UROBILINOGEN 0.2 11/27/2012 1120    NITRITE NEGATIVE 01/08/2022 2230   LEUKOCYTESUR NEGATIVE 01/08/2022 2230    Radiological Exams on Admission: DG Abdomen Acute W/Chest  Result Date: 01/08/2022 CLINICAL DATA:  Shortness of breath and vomiting. EXAM: DG ABDOMEN ACUTE WITH 1 VIEW CHEST COMPARISON:  Chest radiograph dated 06/04/2021. FINDINGS: Diffuse chronic interstitial coarsening. There are bibasilar densities which may represent atelectasis. Developing infiltrate is less likely but not excluded. Blunting of the costophrenic angles may represent trace pleural effusion. No pneumothorax. Mild cardiomegaly. Left pectoral pacemaker device. No bowel dilatation or evidence of obstruction. No free air. Surgical clips in the right flank pain degenerative changes of the spine and scoliosis. No acute osseous pathology. IMPRESSION: 1. Bibasilar atelectasis versus less likely developing infiltrate. 2. Possible trace bilateral pleural effusions. 3. No bowel obstruction. Electronically Signed   By: Anner Crete M.D.   On: 01/08/2022 20:43   US Abdomen Limited RUQ (LIVER/GB)  Result Date: 01/08/2022 CLINICAL DATA:  Emesis EXAM: ULTRASOUND ABDOMEN LIMITED RIGHT UPPER QUADRANT COMPARISON:  None. FINDINGS: Gallbladder: Status post cholecystectomy. Common bile duct: Diameter: 3 mm. Liver: No focal lesion identified. Within normal limits in parenchymal echogenicity. Portal vein is patent on color Doppler imaging with normal direction of blood flow towards the liver. Other: None. IMPRESSION: Unremarkable right upper quadrant ultrasound in a patient status post cholecystectomy. Electronically Signed   By: Iven Finn M.D.   On: 01/08/2022 21:47    EKG: Independently reviewed. A fib, no st elevations  Assessment/Plan Intractable diarrhea Nausea     - admitted to inpt, tele     - check c diff/GI PCR     - fluids, anti-emetics     - can have full liquid diet and advance as tolerated  AKI on CKD3a Dehydration Hyponatremia     - fluids     -  check renal US     - trend Na+; check UNa+/Uosm limit increase to 8pts/day  Elevated LFTs Hyperbilirubinemia     - as a fxn of dehydration?     - check hepatitis panel and RUQ ab [place]     - check fractionated bili  Chronic diastolic HF Elevated BNP Hx  of HTN     - she is dry     - follow her clinical volume status     - hold diuretics; she is on the hypotensive side, so will hold her BP meds  Thrombocytopenia     - mild, no evidence of bleed, follow   Anxiety     - continue home regimen  HLD     - hold statin d/t LFTs  Hypothyroidism     - continue home regimen  A fib on eliquis      - continue eliquis  Glaucoma     - continue home eye drops  DVT prophylaxis: eliquis  Code Status: DNI  Family Communication: w/ dtr at bedside  Consults called: None   Status is: Inpatient  Remains inpatient appropriate because: severity of illness  Jonnie Finner DO [facility]  If 7PM-7AM, please contact night-coverage [email or web address]  01/09/2022, 3:02 PM

## 2022-01-09 NOTE — Progress Notes (Signed)
Plan of Care Note for accepted transfer   Patient: Veronica Young MRN: [id number]   [place]: 01/08/2022  Facility requesting transfer: [facility] Requesting Provider: Dr. Alvino Chapel Reason for transfer: Acute Kidney Injury Facility course:   88 year old deaf female with past medical history of diastolic congestive heart failure (Echo 03/2020 EF 55-60%), chronic atrial fibrillation on Eliquis, sick sinus syndrome status post pig pacemaker placement (07/2014), peripheral artery disease with bilateral popliteal artery occlusion, chronic kidney disease stage IIIb, hypertension, hyperlipidemia who presents to [place] emergency department at the direction of her primary care provider due to complaints of nausea vomiting and diarrhea and concern for dehydration.  Upon initial evaluation in triage, patient was noted to be saturating 83% and placed on submental oxygen.  On evaluation in the emergency department patient is complaining of some mild abdominal pain.  Upon evaluation in the emergency department clinically patient exhibited a mixed picture with physical exam findings above dry mucous membranes as well as jugular venous distention and elevated BNP with chest x-ray revealing bilateral pleural effusions.  ER provider was not certain as to whether or not patient was volume up or volume down and proceeded to administer a 500 cc normal saline bolus followed by a several hours of lactated Ringer solution at 125 cc an hour.  Further evaluation revealed that patient was suffering from acute kidney injury with creatinine of 2.10 as well as a modest hyponatremia of 128.  Chemistry additionally received revealed a transaminitis with AST of 340 and ALT of 174 with an alkaline phosphatase of 156 and total bilirubin of 2.0.  Right upper quadrant ultrasound was performed and was found to be unremarkable.  In light of the patient's clinically mixed presentation, evidence of acute kidney injury and  evidence of transaminitis ER providers requesting hospitalization for further work-up and treatment as necessary.  I have advised that they hold any further intravenous fluids until we can evaluate the patient in person and determine the best course of action.  Plan of care: The patient is accepted for admission to Telemetry unit, at [facility]..    Author: Vernelle Emerald, MD 01/09/2022  Check [email or web address] for on-call coverage.  Nursing staff, Please call [place] number on Amion as soon as patient's arrival, so appropriate admitting provider can evaluate the pt.

## 2022-01-10 DIAGNOSIS — R112 Nausea with vomiting, unspecified: Secondary | ICD-10-CM

## 2022-01-10 DIAGNOSIS — N179 Acute kidney failure, unspecified: Principal | ICD-10-CM

## 2022-01-10 DIAGNOSIS — I5032 Chronic diastolic (congestive) heart failure: Secondary | ICD-10-CM

## 2022-01-10 DIAGNOSIS — H905 Unspecified sensorineural hearing loss: Secondary | ICD-10-CM

## 2022-01-10 DIAGNOSIS — E039 Hypothyroidism, unspecified: Secondary | ICD-10-CM

## 2022-01-10 DIAGNOSIS — R197 Diarrhea, unspecified: Secondary | ICD-10-CM

## 2022-01-10 DIAGNOSIS — E871 Hypo-osmolality and hyponatremia: Secondary | ICD-10-CM

## 2022-01-10 DIAGNOSIS — I482 Chronic atrial fibrillation, unspecified: Secondary | ICD-10-CM

## 2022-01-10 LAB — RENAL FUNCTION PANEL
Albumin: 2.6 g/dL — ABNORMAL LOW (ref 3.5–5.0)
Albumin: 2.6 g/dL — ABNORMAL LOW (ref 3.5–5.0)
Albumin: 2.9 g/dL — ABNORMAL LOW (ref 3.5–5.0)
Anion gap: 8 (ref 5–15)
Anion gap: 8 (ref 5–15)
Anion gap: 9 (ref 5–15)
BUN: 26 mg/dL — ABNORMAL HIGH (ref 8–23)
BUN: 28 mg/dL — ABNORMAL HIGH (ref 8–23)
BUN: 28 mg/dL — ABNORMAL HIGH (ref 8–23)
CO2: 25 mmol/L (ref 22–32)
CO2: 25 mmol/L (ref 22–32)
CO2: 26 mmol/L (ref 22–32)
Calcium: 8.1 mg/dL — ABNORMAL LOW (ref 8.9–10.3)
Calcium: 8.2 mg/dL — ABNORMAL LOW (ref 8.9–10.3)
Calcium: 8.4 mg/dL — ABNORMAL LOW (ref 8.9–10.3)
Chloride: 100 mmol/L (ref 98–111)
Chloride: 99 mmol/L (ref 98–111)
Chloride: 99 mmol/L (ref 98–111)
Creatinine, Ser: 1.72 mg/dL — ABNORMAL HIGH (ref 0.44–1.00)
Creatinine, Ser: 1.75 mg/dL — ABNORMAL HIGH (ref 0.44–1.00)
Creatinine, Ser: 1.88 mg/dL — ABNORMAL HIGH (ref 0.44–1.00)
GFR, Estimated: 26 mL/min — ABNORMAL LOW (ref >60)
GFR, Estimated: 28 mL/min — ABNORMAL LOW (ref >60)
GFR, Estimated: 29 mL/min — ABNORMAL LOW (ref >60)
Glucose, Bld: 117 mg/dL — ABNORMAL HIGH (ref 70–99)
Glucose, Bld: 117 mg/dL — ABNORMAL HIGH (ref 70–99)
Glucose, Bld: 92 mg/dL (ref 70–99)
Phosphorus: 3 mg/dL (ref 2.5–4.6)
Phosphorus: 3.1 mg/dL (ref 2.5–4.6)
Phosphorus: 3.2 mg/dL (ref 2.5–4.6)
Potassium: 3 mmol/L — ABNORMAL LOW (ref 3.5–5.1)
Potassium: 3.1 mmol/L — ABNORMAL LOW (ref 3.5–5.1)
Potassium: 3.2 mmol/L — ABNORMAL LOW (ref 3.5–5.1)
Sodium: 133 mmol/L — ABNORMAL LOW (ref 135–145)
Sodium: 133 mmol/L — ABNORMAL LOW (ref 135–145)
Sodium: 133 mmol/L — ABNORMAL LOW (ref 135–145)

## 2022-01-10 LAB — CBC
HCT: 31.1 % — ABNORMAL LOW (ref 36.0–46.0)
Hemoglobin: 10 g/dL — ABNORMAL LOW (ref 12.0–15.0)
MCH: 28.3 pg (ref 26.0–34.0)
MCHC: 32.2 g/dL (ref 30.0–36.0)
MCV: 88.1 fL (ref 80.0–100.0)
Platelets: 96 10*3/uL — ABNORMAL LOW (ref 150–400)
RBC: 3.53 MIL/uL — ABNORMAL LOW (ref 3.87–5.11)
RDW: 20 % — ABNORMAL HIGH (ref 11.5–15.5)
WBC: 5.4 10*3/uL (ref 4.0–10.5)
nRBC: 0 % (ref 0.0–0.2)

## 2022-01-10 LAB — HEPATITIS PANEL, ACUTE
HCV Ab: NONREACTIVE
Hep A IgM: NONREACTIVE
Hep B C IgM: NONREACTIVE
Hepatitis B Surface Ag: NONREACTIVE

## 2022-01-10 MED ORDER — POTASSIUM CHLORIDE CRYS ER 20 MEQ PO TBCR
40.0000 meq | EXTENDED_RELEASE_TABLET | Freq: Once | ORAL | Status: AC
  Administered 2022-01-10: 40 meq via ORAL
  Filled 2022-01-10: qty 2

## 2022-01-10 NOTE — Progress Notes (Deleted)
°  Progress Note   Date: 01/10/2022  Patient Name: Veronica Young        MRN#: [id number]  Clarification of diagnosis: CKD IIIb

## 2022-01-10 NOTE — TOC Transition Note (Addendum)
Transition of Care [facility]) - CM/SW Discharge Note   Patient Details  Name: JENILYN MAGANA MRN: [id number] Date of Birth: 06/11/1933  Transition of Care [facility]) CM/SW Contact:  Dessa Phi, RN Phone Number: 01/10/2022, 2:57 PM   Clinical Narrative: Used ASL mobile device language for interpretation. d/c home w/[facility] for HHPT. No further CM needs.     Final next level of care: [place] Barriers to Discharge: No Barriers Identified   Patient Goals and CMS Choice Patient states their goals for this hospitalization and ongoing recovery are:: go home CMS Medicare.gov Compare Post Acute Care list provided to:: Patient Choice offered to / list presented to : Patient  Discharge Placement                       Discharge Plan and Services   Discharge Planning Services: CM Consult Post Acute Care Choice: Home Health                    HH Arranged: PT [facility] Agency: [facility] Date [facility] Agency Contacted: 01/10/22 Time [place]: [phone] Representative spoke with at [place]: [place] Determinants of Health ([place]) Interventions     Readmission Risk Interventions No flowsheet data found.

## 2022-01-10 NOTE — Evaluation (Signed)
Physical Therapy Evaluation Patient Details Name: Veronica Young MRN: [id number] DOB: 05-05-1933 Today's Date: 01/10/2022  History of Present Illness  Veronica Young is a 88 y.o. female presents with diarrhea, nausea, vomitting. PMH: CHF, afib, sick sinus syndrome s/p pig pacemaker placement (07/2014), peripheral artery disease with bilateral popliteal artery occlusion, CKD stage IIIb, HTN, hyperlipidemia   Clinical Impression  Pt admitted with above diagnosis. Pt from home with daughter, ind with household ambulation, daughter assists with household chores as needed, using O2 PRN. Pt powers to stand without AD, reports slight unsteadiness and demonstrates mild LOB without physical assist to recover with standing marching so provided RW. Pt ambulates 50 ft with RW, min guard for safety; pt and daughter in agreement that pt appears close to baseline, always more energy and active in the morning and more fatigued in the afternoon. Pt agreeable to HHPT recommendation and learning more. Pt currently with functional limitations due to the deficits listed below (see PT Problem List). Pt will benefit from skilled PT to increase their independence and safety with mobility to allow discharge to the venue listed below.          Recommendations for follow up therapy are one component of a multi-disciplinary discharge planning process, led by the attending physician.  Recommendations may be updated based on patient status, additional functional criteria and insurance authorization.  Follow Up Recommendations Home health PT    Assistance Recommended at Discharge Frequent or constant Supervision/Assistance  Patient can return home with the following  A little help with walking and/or transfers;A little help with bathing/dressing/bathroom;Assistance with cooking/housework    Equipment Recommendations None recommended by PT  Recommendations for Other Services       Functional Status Assessment Patient has  had a recent decline in their functional status and demonstrates the ability to make significant improvements in function in a reasonable and predictable amount of time.     Precautions / Restrictions Precautions Precautions: Fall Restrictions Weight Bearing Restrictions: No      Mobility  Bed Mobility Overal bed mobility: Needs Assistance Bed Mobility: Supine to Sit, Sit to Supine  Supine to sit: Supervision Sit to supine: Min assist  General bed mobility comments: increasead time to come to EOB, use of bedrail; min A to lift BLE back into bed    Transfers Overall transfer level: Needs assistance Equipment used: None Transfers: Sit to/from Stand Sit to Stand: Supervision  General transfer comment: powers to stand with BUE assisting, BLE braced against bed, no physical assist or cues    Ambulation/Gait Ambulation/Gait assistance: Min guard Gait Distance (Feet): 50 Feet Assistive device: Rolling walker (2 wheels) Gait Pattern/deviations: Step-through pattern, Decreased stride length Gait velocity: decreased  General Gait Details: slow, step through pattern with RW, slight unsteadiness with turns without overt LOB or physical assistance, denies pain, dizziness, lightheadedness, "a little breathless" that resolves with standing rest break  Stairs            Wheelchair Mobility    Modified Rankin (Stroke Patients Only)       Balance Overall balance assessment: Mild deficits observed, not formally tested        Pertinent Vitals/Pain Pain Assessment Pain Assessment: No/denies pain    Home Living Family/patient expects to be discharged to:: Private residence Living Arrangements: Children Available Help at Discharge: Family;Available 24 hours/day Type of Home: House Home Access: Level entry       Home Layout: One level Home Equipment: Conservation officer, nature (2 wheels)  Prior Function Prior Level of Function : Independent/Modified Independent;Needs assist   Mobility Comments: pt reports ind with household ambulation, has RW but doesn't use it, denies falls ADLs Comments: pt reports ind with self care, daugther assists with household chores as needed     Hand Dominance        Extremity/Trunk Assessment   Upper Extremity Assessment Upper Extremity Assessment: Overall WFL for tasks assessed    Lower Extremity Assessment Lower Extremity Assessment: Generalized weakness    Cervical / Trunk Assessment Cervical / Trunk Assessment: Kyphotic  Communication   Communication: Interpreter utilized;Deaf (joel [phone])  Cognition Arousal/Alertness: Awake/alert Behavior During Therapy: WFL for tasks assessed/performed Overall Cognitive Status: Within Functional Limits for tasks assessed     General Comments      Exercises     Assessment/Plan    PT Assessment Patient needs continued PT services  PT Problem List Decreased strength;Decreased activity tolerance;Decreased balance;Decreased mobility;Decreased knowledge of use of DME;Cardiopulmonary status limiting activity       PT Treatment Interventions DME instruction;Gait training;Functional mobility training;Therapeutic activities;Therapeutic exercise;Balance training;Patient/family education    PT Goals (Current goals can be found in the Care Plan section)  Acute Rehab PT Goals Patient Stated Goal: interested in hearing more about HHPT PT Goal Formulation: With patient/family Time For Goal Achievement: 01/24/22 Potential to Achieve Goals: Good    Frequency Min 3X/week     Co-evaluation               AM-PAC PT "6 Clicks" Mobility  Outcome Measure Help needed turning from your back to your side while in a flat bed without using bedrails?: A Little Help needed moving from lying on your back to sitting on the side of a flat bed without using bedrails?: A Little Help needed moving to and from a bed to a chair (including a wheelchair)?: A Little Help needed standing up from a  chair using your arms (e.g., wheelchair or bedside chair)?: A Little Help needed to walk in hospital room?: A Little Help needed climbing 3-5 steps with a railing? : A Little 6 Click Score: 18    End of Session Equipment Utilized During Treatment: Gait belt Activity Tolerance: Patient tolerated treatment well Patient left: in bed;with call bell/phone within reach;with nursing/sitter in room;with family/visitor present Nurse Communication: Mobility status PT Visit Diagnosis: Other abnormalities of gait and mobility (R26.89);Muscle weakness (generalized) (M62.81)    Time: 1401-1430 PT Time Calculation (min) (ACUTE ONLY): 29 min   Charges:   PT Evaluation $PT Eval Low Complexity: 1 Low PT Treatments $Gait Training: 8-22 mins         Tori Annaliese Saez PT, DPT 01/10/22, 2:52 PM

## 2022-01-10 NOTE — Progress Notes (Addendum)
PROGRESS NOTE  Veronica Young SFK:[id number] DOB: February 04, 1933 DOA: 01/08/2022 PCP: Katherina Mires, MD   LOS: 1 day   Brief narrative:  Veronica Young is a 88 y.o. female with past medical history of atrial fibrillation on Eliquis, sick sinus syndrome status post pacemaker placement, peripheral arterial disease, CKD stage IIIb, hypothyroidism, hyperlipidemia, hypertension, chronic diastolic heart failure, congenital deafness presented to hospital with nausea vomiting and diarrhea for 1 week with diminished appetite.  Patient did not improve with local treatment at home and had been to her primary care clinic who recommended to come to the hospital.  In the ED, patient was noted to have acute kidney injury and was volume depleted.  Patient was then admitted hospital for further evaluation and treatment.      Assessment/Plan:  Principal Problem:   Nausea vomiting and diarrhea  Nausea, vomiting and diarrhea for 1 week.  Received IV fluids with significant improvement.  Continue antiemetics and symptomatic care.  Has not had a bowel movement since yesterday.  Was on full liquids.  Will advance to solid food today see how she does.   AKI on CKD3b secondary to volume depletion from nausea vomiting and diarrhea. Received IV fluid hydration with improvement.  Right upper quadrant ultrasound was negative for acute findings.  Creatinine of 1.7 at this time.  Hyponatremia secondary to volume depletion. Improved with IV fluids.  Continue to monitor.  Hypokalemia.  We will replenish with oral potassium today.  Check levels in a.m.  Elevated LFTs with Hyperbilirubinemia  right upper quadrant ultrasound was negative for acute findings.  Likely secondary to volume depletion.  Hepatitis panel was negative.   Chronic diastolic HF/Elevated BNP Volume depleted on presentation.  Received IV fluids.  History of hypertension. Hold antihypertensives and diuretics for now.   Thrombocytopenia    Mild.  No  evidence of bleeding.   Anxiety Continue Lexapro.   Hyperlipidemia  hold statins for now.    Hypothyroidism Continue Synthroid.   A fib on eliquis No acute issues.  We will continue.  Glaucoma     - continue home eye drops  Debility, weakness.  We will get PT evaluation.  Disposition.  We will get PT evaluation.  Likely home tomorrow if able to tolerate p.o. and no further symptoms.  Check potassium in AM.  DVT prophylaxis: apixaban (ELIQUIS) tablet 2.5 mg Start: 01/09/22 2200 apixaban (ELIQUIS) tablet 2.5 mg    Code Status: Partial code  Family Communication: Spoke with the patient's daughter at bedside.  Status is: Inpatient  Remains inpatient appropriate because: Volume depletion. dehydration acute kidney injury, debility, weakness   Consultants: None  Procedures: None  Anti-infectives:  None   Anti-infectives (From admission, onward)    None      Subjective: Today, patient was seen and examined at bedside.  Denies any active nausea vomiting or diarrhea today.  Feels very weak and deconditioned.  Not up to her baseline.  Patient's daughter at bedside who interpreted for the patient  Objective: Vitals:   01/10/22 0059 01/10/22 0539  BP: (!) 99/49 99/65  Pulse: (!) 57 64  Resp: 18 18  Temp: 98.9 F (37.2 C) 97.7 F (36.5 C)  SpO2: 94% 95%    Intake/Output Summary (Last 24 hours) at 01/10/2022 1406 Last data filed at 01/10/2022 0600 Gross per 24 hour  Intake 839.25 ml  Output --  Net 839.25 ml   Filed Weights   01/08/22 1936 01/10/22 0700  Weight: 53.1 kg 56.6 kg  Body mass index is 20.14 kg/m.   Physical Exam: GENERAL: Patient is alert awake and oriented. Not in obvious distress.  Congenital deafness. HENT: No scleral pallor or icterus. Pupils equally reactive to light. Oral mucosa is moist NECK: is supple, no gross swelling noted. CHEST: Clear to auscultation. No crackles or wheezes.  Diminished breath sounds bilaterally. CVS: S1  and S2 heard, no murmur. Regular rate and rhythm.  ABDOMEN: Soft, non-tender, bowel sounds are present. EXTREMITIES: No edema. CNS: . No focal motor deficits.  Congenital deafness. SKIN: warm and dry without rashes.  Data Review: I have personally reviewed the following laboratory data and studies,  CBC: Recent Labs  Lab 01/08/22 2002 01/10/22 0013  WBC 6.3 5.4  NEUTROABS 4.2  --   HGB 12.6 10.0*  HCT 38.9 31.1*  MCV 85.7 88.1  PLT 132* 96*   Basic Metabolic Panel: Recent Labs  Lab 01/08/22 2002 01/09/22 1824 01/10/22 0013 01/10/22 0503 01/10/22 1159  NA 128* 132* 133* 133* 133*  K 3.6 3.4* 3.2* 3.0* 3.1*  CL 93* 98 99 99 100  CO2 23 25 25 26 25   GLUCOSE 123* 125* 117* 92 117*  BUN 33* 30* 28* 28* 26*  CREATININE 2.10* 1.85* 1.88* 1.75* 1.72*  CALCIUM 8.8* 8.6* 8.2* 8.4* 8.1*  PHOS  --  3.4 3.0 3.2 3.1   Liver Function Tests: Recent Labs  Lab 01/08/22 2002 01/09/22 1824 01/10/22 0013 01/10/22 0503 01/10/22 1159  AST 340*  --   --   --   --   ALT 174*  --   --   --   --   ALKPHOS 156*  --   --   --   --   BILITOT 2.0* 1.4*  --   --   --   PROT 6.9  --   --   --   --   ALBUMIN 3.5 3.2* 2.6* 2.9* 2.6*   Recent Labs  Lab 01/08/22 2002  LIPASE 54*   No results for input(s): AMMONIA in the last 168 hours. Cardiac Enzymes: No results for input(s): CKTOTAL, CKMB, CKMBINDEX, TROPONINI in the last 168 hours. BNP (last 3 results) Recent Labs    06/04/21 2123 01/08/22 2002  BNP 414.8* 942.1*    ProBNP (last 3 results) No results for input(s): PROBNP in the last 8760 hours.  CBG: No results for input(s): GLUCAP in the last 168 hours. Recent Results (from the past 240 hour(s))  Resp Panel by RT-PCR (Flu A&B, Covid) Nasopharyngeal Swab     Status: None   Collection Time: 01/08/22  8:09 PM   Specimen: Nasopharyngeal Swab; Nasopharyngeal(NP) swabs in vial transport medium  Result Value Ref Range Status   SARS Coronavirus 2 by RT PCR NEGATIVE NEGATIVE  Final    Comment: (NOTE) SARS-CoV-2 target nucleic acids are NOT DETECTED.  The SARS-CoV-2 RNA is generally detectable in upper respiratory specimens during the acute phase of infection. The lowest concentration of SARS-CoV-2 viral copies this assay can detect is 138 copies/mL. A negative result does not preclude SARS-Cov-2 infection and should not be used as the sole basis for treatment or other patient management decisions. A negative result may occur with  improper specimen collection/handling, submission of specimen other than nasopharyngeal swab, presence of viral mutation(s) within the areas targeted by this assay, and inadequate number of viral copies(<138 copies/mL). A negative result must be combined with clinical observations, patient history, and epidemiological information. The expected result is Negative.  Fact Sheet for Patients:  EntrepreneurPulse.com.au  Fact Sheet for Healthcare Providers:  IncredibleEmployment.be  This test is no t yet approved or cleared by the [place] FDA and  has been authorized for detection and/or diagnosis of SARS-CoV-2 by FDA under an Emergency Use Authorization (EUA). This EUA will remain  in effect (meaning this test can be used) for the duration of the COVID-19 declaration under Section 564(b)(1) of the Act, 21 U.S.C.section 360bbb-3(b)(1), unless the authorization is terminated  or revoked sooner.       Influenza A by PCR NEGATIVE NEGATIVE Final   Influenza B by PCR NEGATIVE NEGATIVE Final    Comment: (NOTE) The Xpert Xpress SARS-CoV-2/FLU/RSV plus assay is intended as an aid in the diagnosis of influenza from Nasopharyngeal swab specimens and should not be used as a sole basis for treatment. Nasal washings and aspirates are unacceptable for Xpert Xpress SARS-CoV-2/FLU/RSV testing.  Fact Sheet for Patients: EntrepreneurPulse.com.au  Fact Sheet for Healthcare  Providers: IncredibleEmployment.be  This test is not yet approved or cleared by the [place] FDA and has been authorized for detection and/or diagnosis of SARS-CoV-2 by FDA under an Emergency Use Authorization (EUA). This EUA will remain in effect (meaning this test can be used) for the duration of the COVID-19 declaration under Section 564(b)(1) of the Act, 21 U.S.C. section 360bbb-3(b)(1), unless the authorization is terminated or revoked.  Performed at [facility], [address], [place], [place] [address]      Studies: DG Abdomen Acute W/Chest  Result Date: 01/08/2022 CLINICAL DATA:  Shortness of breath and vomiting. EXAM: DG ABDOMEN ACUTE WITH 1 VIEW CHEST COMPARISON:  Chest radiograph dated 06/04/2021. FINDINGS: Diffuse chronic interstitial coarsening. There are bibasilar densities which may represent atelectasis. Developing infiltrate is less likely but not excluded. Blunting of the costophrenic angles may represent trace pleural effusion. No pneumothorax. Mild cardiomegaly. Left pectoral pacemaker device. No bowel dilatation or evidence of obstruction. No free air. Surgical clips in the right flank pain degenerative changes of the spine and scoliosis. No acute osseous pathology. IMPRESSION: 1. Bibasilar atelectasis versus less likely developing infiltrate. 2. Possible trace bilateral pleural effusions. 3. No bowel obstruction. Electronically Signed   By: Anner Crete M.D.   On: 01/08/2022 20:43   US Abdomen Limited RUQ (LIVER/GB)  Result Date: 01/08/2022 CLINICAL DATA:  Emesis EXAM: ULTRASOUND ABDOMEN LIMITED RIGHT UPPER QUADRANT COMPARISON:  None. FINDINGS: Gallbladder: Status post cholecystectomy. Common bile duct: Diameter: 3 mm. Liver: No focal lesion identified. Within normal limits in parenchymal echogenicity. Portal vein is patent on color Doppler imaging with normal direction of blood flow towards the liver. Other: None. IMPRESSION:  Unremarkable right upper quadrant ultrasound in a patient status post cholecystectomy. Electronically Signed   By: Iven Finn M.D.   On: 01/08/2022 21:47      Flora Lipps, MD  [facility] 01/10/2022  If 7PM-7AM, please contact night-coverage

## 2022-01-11 DIAGNOSIS — I495 Sick sinus syndrome: Secondary | ICD-10-CM

## 2022-01-11 DIAGNOSIS — I10 Essential (primary) hypertension: Secondary | ICD-10-CM

## 2022-01-11 DIAGNOSIS — I4819 Other persistent atrial fibrillation: Secondary | ICD-10-CM

## 2022-01-11 DIAGNOSIS — K219 Gastro-esophageal reflux disease without esophagitis: Secondary | ICD-10-CM

## 2022-01-11 DIAGNOSIS — H9190 Unspecified hearing loss, unspecified ear: Secondary | ICD-10-CM

## 2022-01-11 DIAGNOSIS — I2510 Atherosclerotic heart disease of native coronary artery without angina pectoris: Secondary | ICD-10-CM

## 2022-01-11 DIAGNOSIS — E871 Hypo-osmolality and hyponatremia: Secondary | ICD-10-CM

## 2022-01-11 DIAGNOSIS — N179 Acute kidney failure, unspecified: Secondary | ICD-10-CM

## 2022-01-11 DIAGNOSIS — I739 Peripheral vascular disease, unspecified: Secondary | ICD-10-CM

## 2022-01-11 DIAGNOSIS — E861 Hypovolemia: Secondary | ICD-10-CM

## 2022-01-11 DIAGNOSIS — E440 Moderate protein-calorie malnutrition: Secondary | ICD-10-CM | POA: Insufficient documentation

## 2022-01-11 LAB — COMPREHENSIVE METABOLIC PANEL
ALT: 159 U/L — ABNORMAL HIGH (ref 0–44)
AST: 242 U/L — ABNORMAL HIGH (ref 15–41)
Albumin: 2.7 g/dL — ABNORMAL LOW (ref 3.5–5.0)
Alkaline Phosphatase: 111 U/L (ref 38–126)
Anion gap: 5 (ref 5–15)
BUN: 23 mg/dL (ref 8–23)
CO2: 24 mmol/L (ref 22–32)
Calcium: 7.9 mg/dL — ABNORMAL LOW (ref 8.9–10.3)
Chloride: 103 mmol/L (ref 98–111)
Creatinine, Ser: 1.62 mg/dL — ABNORMAL HIGH (ref 0.44–1.00)
GFR, Estimated: 31 mL/min — ABNORMAL LOW (ref >60)
Glucose, Bld: 90 mg/dL (ref 70–99)
Potassium: 3.7 mmol/L (ref 3.5–5.1)
Sodium: 132 mmol/L — ABNORMAL LOW (ref 135–145)
Total Bilirubin: 1.1 mg/dL (ref 0.3–1.2)
Total Protein: 5.4 g/dL — ABNORMAL LOW (ref 6.5–8.1)

## 2022-01-11 LAB — CBC
HCT: 33.9 % — ABNORMAL LOW (ref 36.0–46.0)
Hemoglobin: 10.4 g/dL — ABNORMAL LOW (ref 12.0–15.0)
MCH: 27.7 pg (ref 26.0–34.0)
MCHC: 30.7 g/dL (ref 30.0–36.0)
MCV: 90.2 fL (ref 80.0–100.0)
Platelets: 102 10*3/uL — ABNORMAL LOW (ref 150–400)
RBC: 3.76 MIL/uL — ABNORMAL LOW (ref 3.87–5.11)
RDW: 20.6 % — ABNORMAL HIGH (ref 11.5–15.5)
WBC: 4.3 10*3/uL (ref 4.0–10.5)
nRBC: 0 % (ref 0.0–0.2)

## 2022-01-11 LAB — MAGNESIUM: Magnesium: 2 mg/dL (ref 1.7–2.4)

## 2022-01-11 MED ORDER — ADULT MULTIVITAMIN W/MINERALS CH
1.0000 | ORAL_TABLET | Freq: Every day | ORAL | Status: DC
  Administered 2022-01-11 – 2022-01-12 (×2): 1 via ORAL
  Filled 2022-01-11 (×2): qty 1

## 2022-01-11 MED ORDER — MUSCLE RUB 10-15 % EX CREA
TOPICAL_CREAM | Freq: Two times a day (BID) | CUTANEOUS | Status: DC | PRN
  Filled 2022-01-11: qty 85

## 2022-01-11 MED ORDER — ENSURE ENLIVE PO LIQD
237.0000 mL | Freq: Three times a day (TID) | ORAL | Status: DC
  Administered 2022-01-11 – 2022-01-12 (×2): 237 mL via ORAL

## 2022-01-11 MED ORDER — CALCIUM CARBONATE ANTACID 500 MG PO CHEW
1.0000 | CHEWABLE_TABLET | Freq: Two times a day (BID) | ORAL | Status: DC
  Administered 2022-01-11 – 2022-01-12 (×3): 200 mg via ORAL
  Filled 2022-01-11 (×3): qty 1

## 2022-01-11 MED ORDER — TROLAMINE SALICYLATE 10 % EX CREA: TOPICAL_CREAM | Freq: Two times a day (BID) | CUTANEOUS | Status: DC | PRN

## 2022-01-11 NOTE — Progress Notes (Addendum)
PROGRESS NOTE    Veronica Young  WJX:[id number] DOB: [birth year] DOA: 01/08/2022 PCP: Katherina Mires, MD    Brief Narrative:  Mrs. Zingale was admitted to the hospital with the working diagnosis of acute kidney injury  on CKD stage 3b due to hypovolemia.    88 yo female with congential deafness who has the past medical history of atrial fibrillation, sick sinus syndrome, sp pacer, hypothyroid, HTN, and diastolic heart failure who presented with diarrhea and fatigue. Reported one week of nausea, vomiting, and diarrhea. She had worsening symptoms, and developed fatigue that prompted her to come to the ED. On her initial physical examination her blood pressure was 89/50, HR 100, R 24, temp 97,7 and oxygen saturation 100%, her lungs were clear to auscultation, heart with S1 and S2 present and rhythmic, abdomen was not distended, not tender or rebound, no lower extremity edema, she was awake and alert.    Na 128, K 3,6, CL 93, bicarbonate 23, glucose 123, BUN 33 and cr 2,10 AST 340 and ALT 174, lipase 54, Alk P 156, T BIL 2,0 Wbc 6,3, hgb 12,6, cht 38,9 and plt 132  SARS COVID 19 negative    Chest radiograph with hyperinflation, left atelectasis, positive scoliosis, personally reviewed.    EKG 79 bpm, right axis deviation, normal qtc, atrial fibrillation rhythm, poor R wave progression, with no significant ST segment or T wave changes.    Patient was placed on IV fluids and supportive medical therapy with good toleration.  Abdominal US with no acute changes   Assessment & Plan:   Principal Problem:   AKI (acute kidney injury) ([place]) Active Problems:   Deafness   GERD (gastroesophageal reflux disease)   Hypothyroidism   PAD (peripheral artery disease) (HCC)   Chronic diastolic CHF (congestive heart failure) (HCC)   HTN (hypertension)   Sinus node dysfunction (HCC)   CAD in native artery   Persistent atrial fibrillation (HCC)   Hyponatremia   Hypovolemia   Acute hypovolemic AKI, on  CKD stage 3b, hyponatremia and hypokalemia.  Patient continue very weak and deconditioned, poor oral intake and decreased mobility. At home she has no 24 hour supervision, lives alone. Her Na continue to be low at 132, renal function with serum cr at 1,62 with K at 3,7 and serum bicarbonate at 24.   Plan to advance diet to regular and discontinue IV fluids. Her baseline cr is around 1,5. Consult nutrition and continue to encourage to get out of bed to chair tid with meals, ambulate in the hallway.   2. Abdominal pain, nausea and vomiting. Continue supportive medical therapy with antiacids, and as needed antiemetics.  Her abdomen is soft and non tender. Lipase elevated on admission along with elevated liver enzymes. Patient sp cholecystectomy, and abdominal US with no acute changes.  Possible passed a stone.   Plan to continue supportive medical therapy and advance diet.   3. HTN, diastolic heart failure,  Blood pressure 107 to 213 mmHg systolic, continue to hold on antihypertensive agents, continue to encourage mobility. Stop IV fluids.   4. Chronic atrial fibrillation  Rate has been controlled  Continue anticoagulation with apixaban.   5. Dyslipidemia.  Continue with statin therapy   6. Hypothyroid Continue with levothyroxine   7. Anxiety continue with escitalopram.   8. Thrombocytopenia and anemia of chronic disease.  Cell count has been stable with hgb at 10,4 and hct at 33,9 with plt at 102.   Status is: Inpatient  Remains inpatient  appropriate because: not back to baseline, possible dc home in the next 24 hours.   DVT prophylaxis:  Enoxaparin   Code Status:   Partial Family Communication:  Sign interpreter at the bedside    Subjective: Patient feeling better but not yet back to baseline, continue to be very weak and deconditioned, no chest pain or dyspnea.   Objective: Vitals:   01/10/22 1316 01/10/22 2013 01/11/22 0429 01/11/22 0651  BP: 103/61 108/64  122/74   Pulse: 68 87  74  Resp: 18 18  18   Temp: (!) 97.4 F (36.3 C) 97.9 F (36.6 C)  97.9 F (36.6 C)  TempSrc: Oral Oral  Oral  SpO2: 95% 94%  93%  Weight:   57.1 kg   Height:        Intake/Output Summary (Last 24 hours) at 01/11/2022 1313 Last data filed at 01/11/2022 1030 Gross per 24 hour  Intake 2076.92 ml  Output 200 ml  Net 1876.92 ml   Filed Weights   01/08/22 1936 01/10/22 0700 01/11/22 0429  Weight: 53.1 kg 56.6 kg 57.1 kg    Examination:   General:  deconditioned and ill looking appearing  Neurology: Awake and alert, non focal  E ENT: no pallor, no icterus, oral mucosa  Cardiovascular: heart with S1 and S2 present with no gallops Pulmonary: lungs clear to ausculation with no wheezing or rhonchi, no rales  Gastrointestinal. Abdomen soft and non tender, not distended. Skin. No rashes Musculoskeletal: no joint deformities     Data Reviewed: I have personally reviewed following labs and imaging studies  CBC: Recent Labs  Lab 01/08/22 2002 01/10/22 0013 01/11/22 0419  WBC 6.3 5.4 4.3  NEUTROABS 4.2  --   --   HGB 12.6 10.0* 10.4*  HCT 38.9 31.1* 33.9*  MCV 85.7 88.1 90.2  PLT 132* 96* 027*   Basic Metabolic Panel: Recent Labs  Lab 01/09/22 1824 01/10/22 0013 01/10/22 0503 01/10/22 1159 01/11/22 0419  NA 132* 133* 133* 133* 132*  K 3.4* 3.2* 3.0* 3.1* 3.7  CL 98 99 99 100 103  CO2 25 25 26 25 24   GLUCOSE 125* 117* 92 117* 90  BUN 30* 28* 28* 26* 23  CREATININE 1.85* 1.88* 1.75* 1.72* 1.62*  CALCIUM 8.6* 8.2* 8.4* 8.1* 7.9*  MG  --   --   --   --  2.0  PHOS 3.4 3.0 3.2 3.1  --    GFR: Estimated Creatinine Clearance: 22.5 mL/min (A) (by C-G formula based on SCr of 1.62 mg/dL (H)). Liver Function Tests: Recent Labs  Lab 01/08/22 2002 01/09/22 1824 01/10/22 0013 01/10/22 0503 01/10/22 1159 01/11/22 0419  AST 340*  --   --   --   --  242*  ALT 174*  --   --   --   --  159*  ALKPHOS 156*  --   --   --   --  111  BILITOT 2.0* 1.4*  --    --   --  1.1  PROT 6.9  --   --   --   --  5.4*  ALBUMIN 3.5 3.2* 2.6* 2.9* 2.6* 2.7*   Recent Labs  Lab 01/08/22 2002  LIPASE 54*   No results for input(s): AMMONIA in the last 168 hours. Coagulation Profile: No results for input(s): INR, PROTIME in the last 168 hours. Cardiac Enzymes: No results for input(s): CKTOTAL, CKMB, CKMBINDEX, TROPONINI in the last 168 hours. BNP (last 3 results) No results for input(s): PROBNP  in the last 8760 hours. HbA1C: No results for input(s): HGBA1C in the last 72 hours. CBG: No results for input(s): GLUCAP in the last 168 hours. Lipid Profile: No results for input(s): CHOL, HDL, LDLCALC, TRIG, CHOLHDL, LDLDIRECT in the last 72 hours. Thyroid Function Tests: No results for input(s): TSH, T4TOTAL, FREET4, T3FREE, THYROIDAB in the last 72 hours. Anemia Panel: No results for input(s): VITAMINB12, FOLATE, FERRITIN, TIBC, IRON, RETICCTPCT in the last 72 hours.    Radiology Studies: I have reviewed all of the imaging during this hospital visit personally     Scheduled Meds:  apixaban  2.5 mg Oral BID   atropine  1 drop Left Eye QHS   brimonidine  1 drop Both Eyes BID   calcium carbonate  1 tablet Oral BID   escitalopram  10 mg Oral Daily   fluorometholone  1 drop Left Eye QID   gabapentin  100 mg Oral Daily   latanoprost  1 drop Both Eyes QHS   levothyroxine  112 mcg Oral QAC breakfast   Netarsudil Dimesylate  1 drop Left Eye QHS   Continuous Infusions:  sodium chloride 75 mL/hr at 01/11/22 8323     LOS: 2 days        Olie Scaffidi Gerome Apley, MD

## 2022-01-11 NOTE — Progress Notes (Signed)
Initial Nutrition Assessment  DOCUMENTATION CODES:  Non-severe (moderate) malnutrition in context of chronic illness  INTERVENTION:  Continue regular diet.  Add Ensure Plus High Protein po TID, each supplement provides 350 kcal and 20 grams of protein.   Add MVI with minerals daily.   Encourage PO and supplement intake.  NUTRITION DIAGNOSIS:  Moderate Malnutrition related to chronic illness (HF) as evidenced by mild fat depletion, mild muscle depletion.  GOAL:  Patient will meet greater than or equal to 90% of their needs  MONITOR:  PO intake, Supplement acceptance, Labs, Weight trends, I & O's  REASON FOR ASSESSMENT:  Consult Assessment of nutrition requirement/status  ASSESSMENT:  88 yo female with congential deafness who has the past medical history of atrial fibrillation, sick sinus syndrome, sp pacer, hypothyroid, HTN, and diastolic heart failure who presented with diarrhea and fatigue. Reported one week of nausea, vomiting, and diarrhea. Admitted with the working diagnosis of acute kidney injury  on CKD stage 3b due to hypovolemia.  Wrote patient using pen/paper to communicate at bedside. Patient reports that she has had poor appetite for about 3 months. It has recently improved.  She reports not being able to afford Ensure. Dietitians unable to provide coupons at this time. Reached out to MD and MD to write prescription for Ensure TID for a few months to help with regaining weight.  Pt reports her usual body weight is around 150 lbs. She reports weighing around 125 lbs right now.  Per Epic, pt eating 100% of the last 3 meals.  Per Epic, pt's weight has been relatively stable over the past 1.5 years.  Of note, pt with mild BLE edema.  Pt with moderate malnutrition but with decent muscle tone for age in some areas as well.  Medications: reviewed; Tums BID, Synthroid  Labs: reviewed; Na 132 (L), Crt 1.32 (H - trending down)  NUTRITION - FOCUSED PHYSICAL  EXAM: Flowsheet Row Most Recent Value  Orbital Region Mild depletion  Upper Arm Region Mild depletion  Thoracic and Lumbar Region No depletion  Buccal Region Mild depletion  Temple Region Mild depletion  Clavicle Bone Region Mild depletion  Clavicle and Acromion Bone Region Mild depletion  Scapular Bone Region No depletion  Dorsal Hand No depletion  Patellar Region No depletion  Anterior Thigh Region No depletion  Posterior Calf Region No depletion  Edema (RD Assessment) Mild  Hair Reviewed  Eyes Reviewed  Mouth Reviewed  Skin Reviewed  Nails Reviewed   Diet Order:   Diet Order             Diet regular Room service appropriate? Yes; Fluid consistency: Thin  Diet effective now           Diet - low sodium heart healthy                  EDUCATION NEEDS:  Education needs have been addressed  Skin:  Skin Assessment: Reviewed RN Assessment  Last BM:  01/09/22 - Type 4, clay/brown, medium  Height:  Ht Readings from Last 1 Encounters:  01/08/22 5\' 6"  (1.676 m)   Weight:  Wt Readings from Last 1 Encounters:  01/11/22 57.1 kg   BMI:  Body mass index is 20.32 kg/m.  Estimated Nutritional Needs:  Kcal:  2641-5830 Protein:  65-80 grams Fluid:  >1.75 L  Derrel Nip, RD, LDN (she/her/hers) Clinical Inpatient Dietitian RD Pager/After-Hours/Weekend Pager # in [place]

## 2022-01-11 NOTE — Plan of Care (Signed)
°  Problem: Nutrition: °Goal: Adequate nutrition will be maintained °Outcome: Progressing °  °Problem: Coping: °Goal: Level of anxiety will decrease °Outcome: Progressing °  °Problem: Safety: °Goal: Ability to remain free from injury will improve °Outcome: Progressing °  °

## 2022-01-12 LAB — BASIC METABOLIC PANEL
Anion gap: 8 (ref 5–15)
BUN: 25 mg/dL — ABNORMAL HIGH (ref 8–23)
CO2: 22 mmol/L (ref 22–32)
Calcium: 8.6 mg/dL — ABNORMAL LOW (ref 8.9–10.3)
Chloride: 101 mmol/L (ref 98–111)
Creatinine, Ser: 1.54 mg/dL — ABNORMAL HIGH (ref 0.44–1.00)
GFR, Estimated: 33 mL/min — ABNORMAL LOW (ref >60)
Glucose, Bld: 103 mg/dL — ABNORMAL HIGH (ref 70–99)
Potassium: 4.1 mmol/L (ref 3.5–5.1)
Sodium: 131 mmol/L — ABNORMAL LOW (ref 135–145)

## 2022-01-12 LAB — MAGNESIUM: Magnesium: 2.2 mg/dL (ref 1.7–2.4)

## 2022-01-12 MED ORDER — ENSURE ENLIVE PO LIQD: 237.0000 mL | Freq: Three times a day (TID) | ORAL | 12 refills | Status: AC

## 2022-01-12 MED ORDER — ADULT MULTIVITAMIN W/MINERALS CH: 1.0000 | ORAL_TABLET | Freq: Every day | ORAL | 0 refills | Status: AC

## 2022-01-12 NOTE — Plan of Care (Signed)
°  Problem: Nutrition: °Goal: Adequate nutrition will be maintained °Outcome: Progressing °  °Problem: Coping: °Goal: Level of anxiety will decrease °Outcome: Progressing °  °Problem: Safety: °Goal: Ability to remain free from injury will improve °Outcome: Progressing °  °

## 2022-01-12 NOTE — Discharge Summary (Signed)
Physician Discharge Summary  Veronica Young OTL:[id number] DOB: 1933-04-20 DOA: 01/08/2022  PCP: Katherina Mires, MD  Admit date: 01/08/2022 Discharge date: 01/12/2022  Admitted From: Home Disposition: Home  Recommendations for Outpatient Follow-up:  Follow-up with PCP in 1 week Repeat CBC and CMP on follow-up visit Increase hydration and avoid use of NSAIDs  Home Health: Yes Equipment/Devices: None Discharge Condition: Stable CODE STATUS: Partial code Diet recommendation: Heart healthy diet  Brief/Interim Summary: 88 yo female with congential deafness who has the past medical history of atrial fibrillation, sick sinus syndrome, sp pacer, hypothyroid, HTN, and diastolic heart failure who presented with diarrhea and fatigue. Reported one week of nausea, vomiting, and diarrhea. She had worsening symptoms, and developed fatigue that prompted her to come to the ED. On her initial physical examination her blood pressure was 89/50, HR 100, R 24, temp 97,7 and oxygen saturation 100%, her lungs were clear to auscultation, heart with S1 and S2 present and rhythmic, abdomen was not distended, not tender or rebound, no lower extremity edema, she was awake and alert.  Na 128, K 3,6, CL 93, bicarbonate 23, glucose 123, BUN 33 and cr 2,10 AST 340 and ALT 174, lipase 54, Alk P 156, T BIL 2,0 Wbc 6,3, hgb 12,6, cht 38,9 and plt 132  SARS COVID 19 negative Chest radiograph with hyperinflation, left atelectasis, positive scoliosis Patient was placed on IV fluids. Abdominal US with no acute changes.  Acute hypovolemic AKI, on CKD stage 3b: -Kidney function improving with IV fluids. -BUN: 25, creatinine: 1.54, GFR 33  -IV fluids discontinued and now she is able to tolerate p.o. diet without any issues. -We will DC home today-follow-up with PCP and repeat BMP on follow-up visit recommended.  Abdominal pain, nausea and vomiting:  -Resolved -Lipase elevated on admission along with elevated liver  enzymes. -Patient sp cholecystectomy, and abdominal US with no acute changes.  -Repeat CMP on follow-up with PCP in 1 week.   HTN:  -Stable.  Resumed home medication.   Chronic diastolic CHF: Remained stable -Patient not volume overloaded on exam.  Chronic atrial fibrillation  -Rate has been controlled  -Continued anticoagulation with apixaban.    Dyslipidemia.  -We will hold off statin therapy due to elevated liver enzymes. -Repeat CMP on follow-up visit with PCP   Hypothyroid -Continued with levothyroxine    Anxiety continued with escitalopram.    Thrombocytopenia and anemia of chronic disease.  -Cell count has been stable with hgb at 10,4 and hct at 33,9 with plt at 102.  -Remained stable.  Chronic hyponatremia: -Sodium 131 this morning.  Hypokalemia: Replenished -Magnesium level: WNL.  Chronic hypoxic respiratory failure: on 2 L of O2 as needed at home    Discharge Diagnoses:  -AKI on CKD stage IIIb -Abdominal pain, nausea and vomiting -Hypertension -Chronic diastolic CHF -Chronic A. Fib -Dyslipidemia -Hypothyroidism -Anxiety -Thrombocytopenia -Anemia of chronic disease -Chronic hyponatremia -Hypokalemia -Chronic hypoxic respiratory failure    Discharge Instructions  Discharge Instructions     Call MD for:  persistant nausea and vomiting   Complete by: As directed    Call MD for:  severe uncontrolled pain   Complete by: As directed    Call MD for:  temperature >100.4   Complete by: As directed    Diet - low sodium heart healthy   Complete by: As directed    Discharge instructions   Complete by: As directed    Follow-up with your primary care physician in 1 week.  Check blood  work at that time.  Seek medical attention for worsening symptoms.  Increase fluid intake.   Discharge instructions   Complete by: As directed    Follow-up with PCP in 1 week Repeat CBC and BMP on follow-up visit Increase hydration and avoid use of NSAIDs   Increase  activity slowly   Complete by: As directed    Increase activity slowly   Complete by: As directed       Allergies as of 01/12/2022       Reactions   Amlodipine Nausea Only      Benicar [olmesartan Medoxomil] Nausea Only        Medication List     TAKE these medications    acetaminophen 500 MG tablet Commonly known as: TYLENOL Take 1,000 mg by mouth every 6 (six) hours as needed for moderate pain.   apixaban 2.5 MG Tabs tablet Commonly known as: Eliquis Take 1 tablet (2.5 mg total) by mouth 2 (two) times daily.   atorvastatin 80 MG tablet Commonly known as: LIPITOR Take 1 tablet (80 mg total) by mouth at bedtime.   atropine 1 % ophthalmic solution Place 1 drop into the left eye at bedtime.   brimonidine 0.2 % ophthalmic solution Commonly known as: ALPHAGAN Place 1 drop into both eyes 2 (two) times daily.   calcium-vitamin D 500-200 MG-UNIT tablet Commonly known as: OSCAL WITH D Take 1 tablet by mouth daily with breakfast.   cholecalciferol 1000 units tablet Commonly known as: VITAMIN D Take 1,000 Units by mouth daily.   escitalopram 10 MG tablet Commonly known as: LEXAPRO Take 10 mg by mouth daily.   feeding supplement Liqd Take 237 mLs by mouth 3 (three) times daily between meals.   fluorometholone 0.1 % ophthalmic suspension Commonly known as: FML Place 1 drop into the left eye 4 (four) times daily.   furosemide 40 MG tablet Commonly known as: LASIX TAKE 1 TABLET EVERY DAY   gabapentin 100 MG capsule Commonly known as: NEURONTIN Take 100 mg by mouth daily.   hydrALAZINE 50 MG tablet Commonly known as: APRESOLINE Take 1 tablet (50 mg total) by mouth daily.   latanoprost 0.005 % ophthalmic solution Commonly known as: XALATAN Place 1 drop into both eyes in the morning and at bedtime.   levothyroxine 112 MCG tablet Commonly known as: SYNTHROID Take 112 mcg by mouth daily before breakfast.   metoprolol tartrate 25 MG tablet Commonly known  as: LOPRESSOR Take 1 tablet (25 mg total) by mouth 2 (two) times daily.   multivitamin with minerals Tabs tablet Take 1 tablet by mouth daily.   ondansetron 4 MG tablet Commonly known as: ZOFRAN Take 4 mg by mouth daily as needed for nausea.   OXYGEN Inhale into the lungs. 2 liters as needed   potassium chloride SA 20 MEQ tablet Commonly known as: KLOR-CON M Take 1 tablet (20 mEq total) by mouth daily.   Rhopressa 0.02 % Soln Generic drug: Netarsudil Dimesylate Place 1 drop into the left eye at bedtime.        Follow-up Information     Katherina Mires, MD Follow up in 1 week(s).   Specialty: Family Medicine Contact information: [place] [address] [phone]         [name], Peter M, MD .   Specialty: Cardiology Contact information: [address] [place] [address] [place] [address] [phone]         Evans Lance, MD .   Specialty:  Cardiology Contact information: [address] [address] [phone]         Golda Acre, [facility] [place] [facility] Follow up.   Specialty: Home Health Services Why: [facility] physical therapy Contact information: [address] [place] [address] [place] [address] [phone]                Allergies  Allergen Reactions   Amlodipine Nausea Only        Benicar [Olmesartan Medoxomil] Nausea Only    Consultations:  none  Procedures/Studies: DG Abdomen Acute W/Chest  Result Date: 01/08/2022 CLINICAL DATA:  Shortness of breath and vomiting. EXAM: DG ABDOMEN ACUTE WITH 1 VIEW CHEST COMPARISON:  Chest radiograph dated 06/04/2021. FINDINGS: Diffuse chronic interstitial coarsening. There are bibasilar densities which may represent atelectasis. Developing infiltrate is less likely but not excluded. Blunting of the costophrenic angles may represent trace pleural effusion. No pneumothorax. Mild cardiomegaly. Left pectoral pacemaker device. No bowel dilatation  or evidence of obstruction. No free air. Surgical clips in the right flank pain degenerative changes of the spine and scoliosis. No acute osseous pathology. IMPRESSION: 1. Bibasilar atelectasis versus less likely developing infiltrate. 2. Possible trace bilateral pleural effusions. 3. No bowel obstruction. Electronically Signed   By: Anner Crete M.D.   On: 01/08/2022 20:43   US Abdomen Limited RUQ (LIVER/GB)  Result Date: 01/08/2022 CLINICAL DATA:  Emesis EXAM: ULTRASOUND ABDOMEN LIMITED RIGHT UPPER QUADRANT COMPARISON:  None. FINDINGS: Gallbladder: Status post cholecystectomy. Common bile duct: Diameter: 3 mm. Liver: No focal lesion identified. Within normal limits in parenchymal echogenicity. Portal vein is patent on color Doppler imaging with normal direction of blood flow towards the liver. Other: None. IMPRESSION: Unremarkable right upper quadrant ultrasound in a patient status post cholecystectomy. Electronically Signed   By: Iven Finn M.D.   On: 01/08/2022 21:47      Subjective: Patient seen and examined.  Sitting comfortably on the bed and eating breakfast.  She is hard of hearing.  Tells me she is feels better, still has some low energy.  No acute events overnight.  No fever, chills.  Comfortable going home today.  I talked to patient's daughter on the phone and we discussed plan of care and she verbalized understanding.  Discharge Exam: Vitals:   01/12/22 0510 01/12/22 0518  BP: 109/60   Pulse: 86 79  Resp: 20   Temp: (!) 97.4 F (36.3 C)   SpO2: 90% 92%   Vitals:   01/11/22 2031 01/12/22 0500 01/12/22 0510 01/12/22 0518  BP: 120/67  109/60   Pulse: 73  86 79  Resp: 20  20   Temp: 98.4 F (36.9 C)  (!) 97.4 F (36.3 C)   TempSrc: Oral  Oral   SpO2: 92%  90% 92%  Weight:  59.8 kg    Height:        General: Pt is alert, awake, not in acute distress, on oxygen, hard of hearing Cardiovascular: RRR, S1/S2 +, no rubs, no gallops Respiratory: CTA bilaterally, no  wheezing, no rhonchi Abdominal: Soft, NT, ND, bowel sounds + Extremities: no edema, no cyanosis    The results of significant diagnostics from this hospitalization (including imaging, microbiology, ancillary and laboratory) are listed below for reference.     Microbiology: Recent Results (from the past 240 hour(s))  Resp Panel by RT-PCR (Flu A&B, Covid) Nasopharyngeal Swab     Status: None   Collection Time: 01/08/22  8:09 PM   Specimen: Nasopharyngeal Swab; Nasopharyngeal(NP)  swabs in vial transport medium  Result Value Ref Range Status   SARS Coronavirus 2 by RT PCR NEGATIVE NEGATIVE Final    Comment: (NOTE) SARS-CoV-2 target nucleic acids are NOT DETECTED.  The SARS-CoV-2 RNA is generally detectable in upper respiratory specimens during the acute phase of infection. The lowest concentration of SARS-CoV-2 viral copies this assay can detect is 138 copies/mL. A negative result does not preclude SARS-Cov-2 infection and should not be used as the sole basis for treatment or other patient management decisions. A negative result may occur with  improper specimen collection/handling, submission of specimen other than nasopharyngeal swab, presence of viral mutation(s) within the areas targeted by this assay, and inadequate number of viral copies(<138 copies/mL). A negative result must be combined with clinical observations, patient history, and epidemiological information. The expected result is Negative.  Fact Sheet for Patients:  EntrepreneurPulse.com.au  Fact Sheet for Healthcare Providers:  IncredibleEmployment.be  This test is no t yet approved or cleared by the [place] FDA and  has been authorized for detection and/or diagnosis of SARS-CoV-2 by FDA under an Emergency Use Authorization (EUA). This EUA will remain  in effect (meaning this test can be used) for the duration of the COVID-19 declaration under Section 564(b)(1) of the Act,  21 U.S.C.section 360bbb-3(b)(1), unless the authorization is terminated  or revoked sooner.       Influenza A by PCR NEGATIVE NEGATIVE Final   Influenza B by PCR NEGATIVE NEGATIVE Final    Comment: (NOTE) The Xpert Xpress SARS-CoV-2/FLU/RSV plus assay is intended as an aid in the diagnosis of influenza from Nasopharyngeal swab specimens and should not be used as a sole basis for treatment. Nasal washings and aspirates are unacceptable for Xpert Xpress SARS-CoV-2/FLU/RSV testing.  Fact Sheet for Patients: EntrepreneurPulse.com.au  Fact Sheet for Healthcare Providers: IncredibleEmployment.be  This test is not yet approved or cleared by the [place] FDA and has been authorized for detection and/or diagnosis of SARS-CoV-2 by FDA under an Emergency Use Authorization (EUA). This EUA will remain in effect (meaning this test can be used) for the duration of the COVID-19 declaration under Section 564(b)(1) of the Act, 21 U.S.C. section 360bbb-3(b)(1), unless the authorization is terminated or revoked.  Performed at [facility], [address] [place], [place] [address]      Labs: BNP (last 3 results) Recent Labs    06/04/21 2123 01/08/22 2002  BNP 414.8* 940.7*   Basic Metabolic Panel: Recent Labs  Lab 01/09/22 1824 01/10/22 0013 01/10/22 0503 01/10/22 1159 01/11/22 0419 01/12/22 0552  NA 132* 133* 133* 133* 132* 131*  K 3.4* 3.2* 3.0* 3.1* 3.7 4.1  CL 98 99 99 100 103 101  CO2 25 25 26 25 24 22   GLUCOSE 125* 117* 92 117* 90 103*  BUN 30* 28* 28* 26* 23 25*  CREATININE 1.85* 1.88* 1.75* 1.72* 1.62* 1.54*  CALCIUM 8.6* 8.2* 8.4* 8.1* 7.9* 8.6*  MG  --   --   --   --  2.0 2.2  PHOS 3.4 3.0 3.2 3.1  --   --    Liver Function Tests: Recent Labs  Lab 01/08/22 2002 01/09/22 1824 01/10/22 0013 01/10/22 0503 01/10/22 1159 01/11/22 0419  AST 340*  --   --   --   --  242*  ALT 174*  --   --   --   --  159*   ALKPHOS 156*  --   --   --   --  111  BILITOT 2.0* 1.4*  --   --   --  1.1  PROT 6.9  --   --   --   --  5.4*  ALBUMIN 3.5 3.2* 2.6* 2.9* 2.6* 2.7*   Recent Labs  Lab 01/08/22 2002  LIPASE 54*   No results for input(s): AMMONIA in the last 168 hours. CBC: Recent Labs  Lab 01/08/22 2002 01/10/22 0013 01/11/22 0419  WBC 6.3 5.4 4.3  NEUTROABS 4.2  --   --   HGB 12.6 10.0* 10.4*  HCT 38.9 31.1* 33.9*  MCV 85.7 88.1 90.2  PLT 132* 96* 102*   Cardiac Enzymes: No results for input(s): CKTOTAL, CKMB, CKMBINDEX, TROPONINI in the last 168 hours. BNP: Invalid input(s): POCBNP CBG: No results for input(s): GLUCAP in the last 168 hours. D-Dimer No results for input(s): DDIMER in the last 72 hours. Hgb A1c No results for input(s): HGBA1C in the last 72 hours. Lipid Profile No results for input(s): CHOL, HDL, LDLCALC, TRIG, CHOLHDL, LDLDIRECT in the last 72 hours. Thyroid function studies No results for input(s): TSH, T4TOTAL, T3FREE, THYROIDAB in the last 72 hours.  Invalid input(s): FREET3 Anemia work up No results for input(s): VITAMINB12, FOLATE, FERRITIN, TIBC, IRON, RETICCTPCT in the last 72 hours. Urinalysis    Component Value Date/Time   COLORURINE YELLOW 01/08/2022 2230   APPEARANCEUR CLEAR 01/08/2022 2230   LABSPEC <1.005 (L) 01/08/2022 2230   PHURINE 5.5 01/08/2022 2230   GLUCOSEU NEGATIVE 01/08/2022 2230   HGBUR LARGE (A) 01/08/2022 2230   BILIRUBINUR NEGATIVE 01/08/2022 2230   KETONESUR NEGATIVE 01/08/2022 2230   PROTEINUR 30 (A) 01/08/2022 2230   UROBILINOGEN 0.2 11/27/2012 1120   NITRITE NEGATIVE 01/08/2022 2230   LEUKOCYTESUR NEGATIVE 01/08/2022 2230   Sepsis Labs Invalid input(s): PROCALCITONIN,  WBC,  LACTICIDVEN Microbiology Recent Results (from the past 240 hour(s))  Resp Panel by RT-PCR (Flu A&B, Covid) Nasopharyngeal Swab     Status: None   Collection Time: 01/08/22  8:09 PM   Specimen: Nasopharyngeal Swab; Nasopharyngeal(NP) swabs in vial  transport medium  Result Value Ref Range Status   SARS Coronavirus 2 by RT PCR NEGATIVE NEGATIVE Final    Comment: (NOTE) SARS-CoV-2 target nucleic acids are NOT DETECTED.  The SARS-CoV-2 RNA is generally detectable in upper respiratory specimens during the acute phase of infection. The lowest concentration of SARS-CoV-2 viral copies this assay can detect is 138 copies/mL. A negative result does not preclude SARS-Cov-2 infection and should not be used as the sole basis for treatment or other patient management decisions. A negative result may occur with  improper specimen collection/handling, submission of specimen other than nasopharyngeal swab, presence of viral mutation(s) within the areas targeted by this assay, and inadequate number of viral copies(<138 copies/mL). A negative result must be combined with clinical observations, patient history, and epidemiological information. The expected result is Negative.  Fact Sheet for Patients:  EntrepreneurPulse.com.au  Fact Sheet for Healthcare Providers:  IncredibleEmployment.be  This test is no t yet approved or cleared by the [place] FDA and  has been authorized for detection and/or diagnosis of SARS-CoV-2 by FDA under an Emergency Use Authorization (EUA). This EUA will remain  in effect (meaning this test can be used) for the duration of the COVID-19 declaration under Section 564(b)(1) of the Act, 21 U.S.C.section 360bbb-3(b)(1), unless the authorization is terminated  or revoked sooner.       Influenza A by PCR NEGATIVE NEGATIVE Final   Influenza B by PCR NEGATIVE NEGATIVE  Final    Comment: (NOTE) The Xpert Xpress SARS-CoV-2/FLU/RSV plus assay is intended as an aid in the diagnosis of influenza from Nasopharyngeal swab specimens and should not be used as a sole basis for treatment. Nasal washings and aspirates are unacceptable for Xpert Xpress SARS-CoV-2/FLU/RSV testing.  Fact  Sheet for Patients: EntrepreneurPulse.com.au  Fact Sheet for Healthcare Providers: IncredibleEmployment.be  This test is not yet approved or cleared by the [place] FDA and has been authorized for detection and/or diagnosis of SARS-CoV-2 by FDA under an Emergency Use Authorization (EUA). This EUA will remain in effect (meaning this test can be used) for the duration of the COVID-19 declaration under Section 564(b)(1) of the Act, 21 U.S.C. section 360bbb-3(b)(1), unless the authorization is terminated or revoked.  Performed at [facility], [address] [place], [place] [address]      Time coordinating discharge: Over 30 minutes  SIGNED:   Mckinley Jewel, MD  [facility] 01/12/2022, 10:52 AM Pager   If 7PM-7AM, please contact night-coverage [email or web address]

## 2022-01-13 ENCOUNTER — Inpatient Hospital Stay (HOSPITAL_BASED_OUTPATIENT_CLINIC_OR_DEPARTMENT_OTHER)
Admission: EM | Admit: 2022-01-13 | Discharge: 2022-01-18 | DRG: 291 | Disposition: A | Discharge status: Home Health | Payer: Medicare HMO | Attending: Internal Medicine | Admitting: Internal Medicine

## 2022-01-13 ENCOUNTER — Emergency Department (HOSPITAL_BASED_OUTPATIENT_CLINIC_OR_DEPARTMENT_OTHER): Payer: Medicare HMO

## 2022-01-13 ENCOUNTER — Encounter (HOSPITAL_BASED_OUTPATIENT_CLINIC_OR_DEPARTMENT_OTHER): Payer: Self-pay | Admitting: Emergency Medicine

## 2022-01-13 DIAGNOSIS — I2721 Secondary pulmonary arterial hypertension: Secondary | ICD-10-CM | POA: Diagnosis present

## 2022-01-13 DIAGNOSIS — R601 Generalized edema: Secondary | ICD-10-CM

## 2022-01-13 DIAGNOSIS — R011 Cardiac murmur, unspecified: Secondary | ICD-10-CM

## 2022-01-13 DIAGNOSIS — I495 Sick sinus syndrome: Secondary | ICD-10-CM | POA: Diagnosis present

## 2022-01-13 DIAGNOSIS — I5033 Acute on chronic diastolic (congestive) heart failure: Secondary | ICD-10-CM | POA: Diagnosis present

## 2022-01-13 DIAGNOSIS — Z8249 Family history of ischemic heart disease and other diseases of the circulatory system: Secondary | ICD-10-CM

## 2022-01-13 DIAGNOSIS — E440 Moderate protein-calorie malnutrition: Secondary | ICD-10-CM | POA: Diagnosis present

## 2022-01-13 DIAGNOSIS — I2510 Atherosclerotic heart disease of native coronary artery without angina pectoris: Secondary | ICD-10-CM | POA: Diagnosis present

## 2022-01-13 DIAGNOSIS — H9190 Unspecified hearing loss, unspecified ear: Secondary | ICD-10-CM

## 2022-01-13 DIAGNOSIS — Z7901 Long term (current) use of anticoagulants: Secondary | ICD-10-CM

## 2022-01-13 DIAGNOSIS — R600 Localized edema: Secondary | ICD-10-CM | POA: Diagnosis not present

## 2022-01-13 DIAGNOSIS — I5082 Biventricular heart failure: Secondary | ICD-10-CM | POA: Diagnosis present

## 2022-01-13 DIAGNOSIS — E8770 Fluid overload, unspecified: Secondary | ICD-10-CM

## 2022-01-13 DIAGNOSIS — Z9049 Acquired absence of other specified parts of digestive tract: Secondary | ICD-10-CM

## 2022-01-13 DIAGNOSIS — E7800 Pure hypercholesterolemia, unspecified: Secondary | ICD-10-CM | POA: Diagnosis present

## 2022-01-13 DIAGNOSIS — R0602 Shortness of breath: Secondary | ICD-10-CM

## 2022-01-13 DIAGNOSIS — I509 Heart failure, unspecified: Secondary | ICD-10-CM

## 2022-01-13 DIAGNOSIS — Z96651 Presence of right artificial knee joint: Secondary | ICD-10-CM | POA: Diagnosis present

## 2022-01-13 DIAGNOSIS — I959 Hypotension, unspecified: Secondary | ICD-10-CM | POA: Diagnosis not present

## 2022-01-13 DIAGNOSIS — F32A Depression, unspecified: Secondary | ICD-10-CM

## 2022-01-13 DIAGNOSIS — Z79899 Other long term (current) drug therapy: Secondary | ICD-10-CM

## 2022-01-13 DIAGNOSIS — Z79890 Hormone replacement therapy (postmenopausal): Secondary | ICD-10-CM

## 2022-01-13 DIAGNOSIS — I10 Essential (primary) hypertension: Secondary | ICD-10-CM | POA: Diagnosis present

## 2022-01-13 DIAGNOSIS — Z20822 Contact with and (suspected) exposure to covid-19: Secondary | ICD-10-CM | POA: Diagnosis present

## 2022-01-13 DIAGNOSIS — N184 Chronic kidney disease, stage 4 (severe): Secondary | ICD-10-CM | POA: Diagnosis present

## 2022-01-13 DIAGNOSIS — I072 Rheumatic tricuspid stenosis and insufficiency: Secondary | ICD-10-CM | POA: Diagnosis present

## 2022-01-13 DIAGNOSIS — Z888 Allergy status to other drugs, medicaments and biological substances status: Secondary | ICD-10-CM

## 2022-01-13 DIAGNOSIS — Z950 Presence of cardiac pacemaker: Secondary | ICD-10-CM | POA: Diagnosis present

## 2022-01-13 DIAGNOSIS — Z6821 Body mass index (BMI) 21.0-21.9, adult: Secondary | ICD-10-CM

## 2022-01-13 DIAGNOSIS — E871 Hypo-osmolality and hyponatremia: Secondary | ICD-10-CM

## 2022-01-13 DIAGNOSIS — K219 Gastro-esophageal reflux disease without esophagitis: Secondary | ICD-10-CM | POA: Diagnosis present

## 2022-01-13 DIAGNOSIS — I130 Hypertensive heart and chronic kidney disease with heart failure and stage 1 through stage 4 chronic kidney disease, or unspecified chronic kidney disease: Principal | ICD-10-CM | POA: Diagnosis present

## 2022-01-13 DIAGNOSIS — J9611 Chronic respiratory failure with hypoxia: Secondary | ICD-10-CM

## 2022-01-13 DIAGNOSIS — J9601 Acute respiratory failure with hypoxia: Secondary | ICD-10-CM

## 2022-01-13 DIAGNOSIS — I4819 Other persistent atrial fibrillation: Secondary | ICD-10-CM | POA: Diagnosis present

## 2022-01-13 DIAGNOSIS — E039 Hypothyroidism, unspecified: Secondary | ICD-10-CM | POA: Diagnosis present

## 2022-01-13 DIAGNOSIS — H905 Unspecified sensorineural hearing loss: Secondary | ICD-10-CM | POA: Diagnosis present

## 2022-01-13 DIAGNOSIS — I480 Paroxysmal atrial fibrillation: Secondary | ICD-10-CM | POA: Diagnosis present

## 2022-01-13 DIAGNOSIS — H409 Unspecified glaucoma: Secondary | ICD-10-CM | POA: Diagnosis present

## 2022-01-13 LAB — CBC WITH DIFFERENTIAL/PLATELET
Abs Immature Granulocytes: 0.02 10*3/uL (ref 0.00–0.07)
Basophils Absolute: 0 10*3/uL (ref 0.0–0.1)
Basophils Relative: 1 %
Eosinophils Absolute: 0 10*3/uL (ref 0.0–0.5)
Eosinophils Relative: 1 %
HCT: 39.6 % (ref 36.0–46.0)
Hemoglobin: 12.9 g/dL (ref 12.0–15.0)
Immature Granulocytes: 0 %
Lymphocytes Relative: 21 %
Lymphs Abs: 1.4 10*3/uL (ref 0.7–4.0)
MCH: 28.2 pg (ref 26.0–34.0)
MCHC: 32.6 g/dL (ref 30.0–36.0)
MCV: 86.7 fL (ref 80.0–100.0)
Monocytes Absolute: 0.5 10*3/uL (ref 0.1–1.0)
Monocytes Relative: 8 %
Neutro Abs: 4.6 10*3/uL (ref 1.7–7.7)
Neutrophils Relative %: 69 %
Platelets: 154 10*3/uL (ref 150–400)
RBC: 4.57 MIL/uL (ref 3.87–5.11)
RDW: 21.2 % — ABNORMAL HIGH (ref 11.5–15.5)
WBC: 6.7 10*3/uL (ref 4.0–10.5)
nRBC: 0 % (ref 0.0–0.2)

## 2022-01-13 LAB — COMPREHENSIVE METABOLIC PANEL
ALT: 232 U/L — ABNORMAL HIGH (ref 0–44)
AST: 281 U/L — ABNORMAL HIGH (ref 15–41)
Albumin: 3.5 g/dL (ref 3.5–5.0)
Alkaline Phosphatase: 178 U/L — ABNORMAL HIGH (ref 38–126)
Anion gap: 10 (ref 5–15)
BUN: 32 mg/dL — ABNORMAL HIGH (ref 8–23)
CO2: 22 mmol/L (ref 22–32)
Calcium: 9.1 mg/dL (ref 8.9–10.3)
Chloride: 95 mmol/L — ABNORMAL LOW (ref 98–111)
Creatinine, Ser: 1.61 mg/dL — ABNORMAL HIGH (ref 0.44–1.00)
GFR, Estimated: 31 mL/min — ABNORMAL LOW (ref >60)
Glucose, Bld: 122 mg/dL — ABNORMAL HIGH (ref 70–99)
Potassium: 4.6 mmol/L (ref 3.5–5.1)
Sodium: 127 mmol/L — ABNORMAL LOW (ref 135–145)
Total Bilirubin: 1.6 mg/dL — ABNORMAL HIGH (ref 0.3–1.2)
Total Protein: 7.3 g/dL (ref 6.5–8.1)

## 2022-01-13 LAB — RESP PANEL BY RT-PCR (FLU A&B, COVID) ARPGX2
Influenza A by PCR: NEGATIVE
Influenza B by PCR: NEGATIVE
SARS Coronavirus 2 by RT PCR: NEGATIVE

## 2022-01-13 LAB — D-DIMER, QUANTITATIVE: D-Dimer, Quant: 2.35 ug/mL-FEU — ABNORMAL HIGH (ref 0.00–0.50)

## 2022-01-13 LAB — BRAIN NATRIURETIC PEPTIDE: B Natriuretic Peptide: 767.6 pg/mL — ABNORMAL HIGH (ref 0.0–100.0)

## 2022-01-13 MED ORDER — FUROSEMIDE 10 MG/ML IJ SOLN
20.0000 mg | Freq: Once | INTRAMUSCULAR | Status: AC
  Administered 2022-01-13: 20 mg via INTRAVENOUS
  Filled 2022-01-13: qty 2

## 2022-01-13 NOTE — Plan of Care (Signed)
[facility] will assume care on arrival to accepting facility. Until arrival, care as per EDP. However, [facility] available 24/7 for questions and assistance.  Nursing staff, please page [facility] Admits and Consults ([phone]) as soon as the patient arrives to the hospital.   

## 2022-01-13 NOTE — ED Notes (Signed)
Tape not wanting to stick from IV.  Wrapped with coban for protection.

## 2022-01-13 NOTE — ED Provider Notes (Addendum)
[place] EMERGENCY DEPARTMENT Provider Note   CSN: [id number] Arrival date & time: 01/13/22  1855     History  Chief Complaint  Patient presents with   Weakness    Veronica Young is a 88 y.o. female.  Patient is a 88 yo female presenting with daughter with pmh afib on eliquis, CHF, and deaf requiring sign language presenting for sob. Patient admits to bilateral lower extremity swelling all the way to the knees that daughter noticed today after the patient was discharge. Pt recently admitted for dehydration with AKI. Pt denies sob but presented to ED hypoxic at 76%  The history is provided by the patient. No language interpreter was used.  Weakness Associated symptoms: no abdominal pain, no arthralgias, no chest pain, no cough, no dysuria, no fever, no seizures, no shortness of breath and no vomiting       Home Medications Prior to Admission medications   Medication Sig Start Date End Date Taking? Authorizing Provider  acetaminophen (TYLENOL) 500 MG tablet Take 1,000 mg by mouth every 6 (six) hours as needed for moderate pain.    [provider]  apixaban (ELIQUIS) 2.5 MG TABS tablet Take 1 tablet (2.5 mg total) by mouth 2 (two) times daily. 11/09/21   [name], Peter M, MD  atropine 1 % ophthalmic solution Place 1 drop into the left eye at bedtime.     [provider]  brimonidine (ALPHAGAN) 0.2 % ophthalmic solution Place 1 drop into both eyes 2 (two) times daily. 05/20/17   [provider]  calcium-vitamin D (OSCAL WITH D) 500-200 MG-UNIT per tablet Take 1 tablet by mouth daily with breakfast.    [provider]  cholecalciferol (VITAMIN D) 1000 UNITS tablet Take 1,000 Units by mouth daily.    [provider]  escitalopram (LEXAPRO) 10 MG tablet Take 10 mg by mouth daily. 11/19/21   [provider]  feeding supplement (ENSURE ENLIVE / ENSURE PLUS) LIQD Take 237 mLs by mouth 3 (three) times daily between meals.  01/12/22   Pahwani, Michell Heinrich, MD  fluorometholone (FML) 0.1 % ophthalmic suspension Place 1 drop into the left eye 4 (four) times daily. 01/17/20   [provider]  furosemide (LASIX) 40 MG tablet TAKE 1 TABLET EVERY DAY Patient taking differently: Take 40 mg by mouth daily. 11/19/21   [name], Peter M, MD  gabapentin (NEURONTIN) 100 MG capsule Take 100 mg by mouth daily.  10/24/19   [provider]  hydrALAZINE (APRESOLINE) 50 MG tablet Take 1 tablet (50 mg total) by mouth daily. 01/08/22   [name], Peter M, MD  latanoprost (XALATAN) 0.005 % ophthalmic solution Place 1 drop into both eyes in the morning and at bedtime.  11/25/15   [provider]  levothyroxine (SYNTHROID, LEVOTHROID) 112 MCG tablet Take 112 mcg by mouth daily before breakfast.  03/31/14   [provider]  metoprolol tartrate (LOPRESSOR) 25 MG tablet Take 1 tablet (25 mg total) by mouth 2 (two) times daily. 01/11/21   [name], Peter M, MD  Multiple Vitamin (MULTIVITAMIN WITH MINERALS) TABS tablet Take 1 tablet by mouth daily. 01/12/22   Pahwani, Michell Heinrich, MD  ondansetron (ZOFRAN) 4 MG tablet Take 4 mg by mouth daily as needed for nausea. 01/07/22 01/07/23  [provider]  OXYGEN Inhale into the lungs. 2 liters as needed    [provider]  potassium chloride SA (KLOR-CON) 20 MEQ tablet Take 1 tablet (20 mEq total) by mouth daily. Patient not  taking: Reported on 01/09/2022 01/12/21 01/09/22  [name], Peter M, MD  RHOPRESSA 0.02 % SOLN Place 1 drop into the left eye at bedtime. 10/28/19   [provider]      Allergies    Amlodipine and Benicar [olmesartan medoxomil]    Review of Systems   Review of Systems  Constitutional:  Negative for chills and fever.  HENT:  Negative for ear pain and sore throat.   Eyes:  Negative for pain and visual disturbance.  Respiratory:  Negative for cough and shortness of breath.   Cardiovascular:  Positive for leg swelling. Negative for chest pain and  palpitations.  Gastrointestinal:  Negative for abdominal pain and vomiting.  Genitourinary:  Negative for dysuria and hematuria.  Musculoskeletal:  Negative for arthralgias and back pain.  Skin:  Negative for color change and rash.  Neurological:  Positive for weakness. Negative for seizures and syncope.  All other systems reviewed and are negative.  Physical Exam Updated Vital Signs BP (!) 109/58    Pulse 70    Temp 98.1 F (36.7 C) (Oral)    Resp (!) 26    Ht 5\' 6"  (1.676 m)    SpO2 90%    BMI 21.28 kg/m  Physical Exam Vitals and nursing note reviewed.  Constitutional:      General: She is not in acute distress.    Appearance: She is well-developed.  HENT:     Head: Normocephalic and atraumatic.  Eyes:     Conjunctiva/sclera: Conjunctivae normal.  Cardiovascular:     Rate and Rhythm: Normal rate and regular rhythm.     Heart sounds: No murmur heard. Pulmonary:     Effort: Tachypnea and respiratory distress present.     Breath sounds: Examination of the right-middle field reveals rhonchi. Examination of the left-middle field reveals rhonchi. Examination of the right-lower field reveals rhonchi. Examination of the left-lower field reveals rhonchi. Rhonchi present.  Abdominal:     Palpations: Abdomen is soft.     Tenderness: There is no abdominal tenderness.  Musculoskeletal:        General: No swelling.     Cervical back: Neck supple.     Right lower leg: 4+ Pitting Edema present.     Left lower leg: 4+ Pitting Edema present.  Skin:    General: Skin is warm and dry.     Capillary Refill: Capillary refill takes less than 2 seconds.  Neurological:     Mental Status: She is alert.  Psychiatric:        Mood and Affect: Mood normal.    ED Results / Procedures / Treatments   Labs (all labs ordered are listed, but only abnormal results are displayed) Labs Reviewed  CBC WITH DIFFERENTIAL/PLATELET - Abnormal; Notable for the following components:      Result Value   RDW 21.2  (*)    All other components within normal limits  RESP PANEL BY RT-PCR (FLU A&B, COVID) ARPGX2  BRAIN NATRIURETIC PEPTIDE  COMPREHENSIVE METABOLIC PANEL    EKG None  Radiology No results found.  Procedures .Critical Care Performed by: Lianne Cure, DO Authorized by: Lianne Cure, DO   Critical care provider statement:    Critical care time (minutes):  30   Critical care was necessary to treat or prevent imminent or life-threatening deterioration of the following conditions:  Respiratory failure   Critical care was time spent personally by me on the following activities:  Development of treatment plan with patient or surrogate,  discussions with consultants, evaluation of patient's response to treatment, examination of patient, ordering and review of laboratory studies, ordering and review of radiographic studies, ordering and performing treatments and interventions, pulse oximetry, re-evaluation of patient's condition and review of old charts    Medications Ordered in ED Medications - No data to display  ED Course/ Medical Decision Making/ A&P Clinical Course as of 01/13/22 2222  Sun Jan 13, 2022  2218 Creatinine(!): 1.61 [AG]    Clinical Course User Index [AG] Lianne Cure, DO                           Medical Decision Making Amount and/or Complexity of Data Reviewed Labs: ordered. Decision-making details documented in ED Course. Radiology: ordered. ECG/medicine tests: ordered.  Risk Prescription drug management. Decision regarding hospitalization.   8:04 PM  88 yo female presenting with daughter with pmh afib on eliquis, CHF, and deaf requiring sign language presenting for sob. On arrival pt is Aox3, acute respiratory distress, tachypneic, and hypoxic at 76%. [place] 4 L started with improvement of oxygen sat to 91% with rest. Pt has bilateral rales in lower lung fields. +4 pitting edema bilateral lower extremities. Pt also has signs of anasarca with abdominal  swelling and significant weight gain in one week.   Elevated BNP but decreased from previous studies on recent admission on 9/17 but this was prior to IVF for AKI and dehydration. Concern for fluid overloaded state. CXR demonstrates cardiomegaly with pulmonary vascular congestion. No pleural effusions. Lasix given.  Recommend admission. Pt agreeable to plan. I spoke with admitting physician Dr. Marlowe Sax who agrees to accept patient.         Final Clinical Impression(s) / ED Diagnoses Final diagnoses:  SOB (shortness of breath)  Acute respiratory failure with hypoxia (HCC)  Lower extremity edema  Hypervolemia, unspecified hypervolemia type    Rx / DC Orders ED Discharge Orders     None         Lianne Cure, DO 28/36/62 [id number]    Lianne Cure, DO 54/65/03 2308

## 2022-01-13 NOTE — ED Triage Notes (Signed)
Pt came in with c/o weakness. Hx of CHF. Discharged from hospital yesterday. She has worsening leg swelling, especially Right sided. Saturation at approx 85 on arrival.

## 2022-01-14 ENCOUNTER — Other Ambulatory Visit: Payer: Self-pay

## 2022-01-14 ENCOUNTER — Inpatient Hospital Stay (HOSPITAL_COMMUNITY): Payer: Medicare HMO

## 2022-01-14 DIAGNOSIS — I130 Hypertensive heart and chronic kidney disease with heart failure and stage 1 through stage 4 chronic kidney disease, or unspecified chronic kidney disease: Secondary | ICD-10-CM | POA: Diagnosis present

## 2022-01-14 DIAGNOSIS — R011 Cardiac murmur, unspecified: Secondary | ICD-10-CM | POA: Diagnosis not present

## 2022-01-14 DIAGNOSIS — H409 Unspecified glaucoma: Secondary | ICD-10-CM | POA: Diagnosis present

## 2022-01-14 DIAGNOSIS — R601 Generalized edema: Secondary | ICD-10-CM | POA: Diagnosis not present

## 2022-01-14 DIAGNOSIS — I2510 Atherosclerotic heart disease of native coronary artery without angina pectoris: Secondary | ICD-10-CM | POA: Diagnosis present

## 2022-01-14 DIAGNOSIS — Z20822 Contact with and (suspected) exposure to covid-19: Secondary | ICD-10-CM | POA: Diagnosis present

## 2022-01-14 DIAGNOSIS — Z96651 Presence of right artificial knee joint: Secondary | ICD-10-CM | POA: Diagnosis present

## 2022-01-14 DIAGNOSIS — I2721 Secondary pulmonary arterial hypertension: Secondary | ICD-10-CM | POA: Diagnosis present

## 2022-01-14 DIAGNOSIS — R600 Localized edema: Secondary | ICD-10-CM | POA: Diagnosis present

## 2022-01-14 DIAGNOSIS — J9621 Acute and chronic respiratory failure with hypoxia: Secondary | ICD-10-CM | POA: Diagnosis not present

## 2022-01-14 DIAGNOSIS — N184 Chronic kidney disease, stage 4 (severe): Secondary | ICD-10-CM | POA: Diagnosis present

## 2022-01-14 DIAGNOSIS — H9190 Unspecified hearing loss, unspecified ear: Secondary | ICD-10-CM | POA: Diagnosis not present

## 2022-01-14 DIAGNOSIS — E7800 Pure hypercholesterolemia, unspecified: Secondary | ICD-10-CM | POA: Diagnosis present

## 2022-01-14 DIAGNOSIS — I5033 Acute on chronic diastolic (congestive) heart failure: Secondary | ICD-10-CM | POA: Diagnosis present

## 2022-01-14 DIAGNOSIS — J9611 Chronic respiratory failure with hypoxia: Secondary | ICD-10-CM | POA: Diagnosis present

## 2022-01-14 DIAGNOSIS — Z7901 Long term (current) use of anticoagulants: Secondary | ICD-10-CM | POA: Diagnosis not present

## 2022-01-14 DIAGNOSIS — K219 Gastro-esophageal reflux disease without esophagitis: Secondary | ICD-10-CM | POA: Diagnosis present

## 2022-01-14 DIAGNOSIS — I4819 Other persistent atrial fibrillation: Secondary | ICD-10-CM | POA: Diagnosis present

## 2022-01-14 DIAGNOSIS — I072 Rheumatic tricuspid stenosis and insufficiency: Secondary | ICD-10-CM | POA: Diagnosis present

## 2022-01-14 DIAGNOSIS — H905 Unspecified sensorineural hearing loss: Secondary | ICD-10-CM | POA: Diagnosis present

## 2022-01-14 DIAGNOSIS — E039 Hypothyroidism, unspecified: Secondary | ICD-10-CM | POA: Diagnosis present

## 2022-01-14 DIAGNOSIS — Z79899 Other long term (current) drug therapy: Secondary | ICD-10-CM | POA: Diagnosis not present

## 2022-01-14 DIAGNOSIS — D6869 Other thrombophilia: Secondary | ICD-10-CM | POA: Diagnosis not present

## 2022-01-14 DIAGNOSIS — Z888 Allergy status to other drugs, medicaments and biological substances status: Secondary | ICD-10-CM | POA: Diagnosis not present

## 2022-01-14 DIAGNOSIS — I5082 Biventricular heart failure: Secondary | ICD-10-CM | POA: Diagnosis present

## 2022-01-14 DIAGNOSIS — Z79890 Hormone replacement therapy (postmenopausal): Secondary | ICD-10-CM | POA: Diagnosis not present

## 2022-01-14 DIAGNOSIS — E440 Moderate protein-calorie malnutrition: Secondary | ICD-10-CM | POA: Diagnosis present

## 2022-01-14 DIAGNOSIS — I495 Sick sinus syndrome: Secondary | ICD-10-CM | POA: Diagnosis present

## 2022-01-14 DIAGNOSIS — E871 Hypo-osmolality and hyponatremia: Secondary | ICD-10-CM | POA: Diagnosis not present

## 2022-01-14 DIAGNOSIS — Z950 Presence of cardiac pacemaker: Secondary | ICD-10-CM

## 2022-01-14 DIAGNOSIS — I959 Hypotension, unspecified: Secondary | ICD-10-CM | POA: Diagnosis not present

## 2022-01-14 LAB — ECHOCARDIOGRAM COMPLETE
AR max vel: 1.91 cm2
AV Area VTI: 1.89 cm2
AV Area mean vel: 1.81 cm2
AV Mean grad: 2 mmHg
AV Peak grad: 3 mmHg
Ao pk vel: 0.87 m/s
Height: 65 in
S' Lateral: 2.1 cm
Weight: 2116.42 oz

## 2022-01-14 LAB — BASIC METABOLIC PANEL
Anion gap: 9 (ref 5–15)
BUN: 31 mg/dL — ABNORMAL HIGH (ref 8–23)
CO2: 23 mmol/L (ref 22–32)
Calcium: 8.8 mg/dL — ABNORMAL LOW (ref 8.9–10.3)
Chloride: 98 mmol/L (ref 98–111)
Creatinine, Ser: 1.72 mg/dL — ABNORMAL HIGH (ref 0.44–1.00)
GFR, Estimated: 29 mL/min — ABNORMAL LOW (ref >60)
Glucose, Bld: 129 mg/dL — ABNORMAL HIGH (ref 70–99)
Potassium: 3.9 mmol/L (ref 3.5–5.1)
Sodium: 130 mmol/L — ABNORMAL LOW (ref 135–145)

## 2022-01-14 MED ORDER — SODIUM CHLORIDE 0.9 % IV SOLN: 250.0000 mL | INTRAVENOUS | Status: DC | PRN

## 2022-01-14 MED ORDER — HYDRALAZINE HCL 25 MG PO TABS
25.0000 mg | ORAL_TABLET | Freq: Two times a day (BID) | ORAL | Status: DC
  Administered 2022-01-14 – 2022-01-15 (×2): 25 mg via ORAL
  Filled 2022-01-14 (×3): qty 1

## 2022-01-14 MED ORDER — BRIMONIDINE TARTRATE 0.2 % OP SOLN
1.0000 [drp] | Freq: Two times a day (BID) | OPHTHALMIC | Status: DC
  Administered 2022-01-14 – 2022-01-18 (×9): 1 [drp] via OPHTHALMIC
  Filled 2022-01-14: qty 5

## 2022-01-14 MED ORDER — NETARSUDIL DIMESYLATE 0.02 % OP SOLN: 1.0000 [drp] | Freq: Every day | OPHTHALMIC | Status: DC

## 2022-01-14 MED ORDER — FLUOROMETHOLONE 0.1 % OP SUSP
1.0000 [drp] | Freq: Four times a day (QID) | OPHTHALMIC | Status: DC
  Administered 2022-01-14 – 2022-01-18 (×17): 1 [drp] via OPHTHALMIC
  Filled 2022-01-14: qty 5

## 2022-01-14 MED ORDER — METOPROLOL TARTRATE 25 MG PO TABS
25.0000 mg | ORAL_TABLET | Freq: Two times a day (BID) | ORAL | Status: DC
  Administered 2022-01-14 – 2022-01-15 (×3): 25 mg via ORAL
  Filled 2022-01-14 (×3): qty 1

## 2022-01-14 MED ORDER — FUROSEMIDE 10 MG/ML IJ SOLN
40.0000 mg | Freq: Once | INTRAMUSCULAR | Status: AC
  Administered 2022-01-14: 40 mg via INTRAVENOUS
  Filled 2022-01-14: qty 4

## 2022-01-14 MED ORDER — SODIUM CHLORIDE 0.9% FLUSH: 3.0000 mL | INTRAVENOUS | Status: DC | PRN

## 2022-01-14 MED ORDER — APIXABAN 2.5 MG PO TABS
2.5000 mg | ORAL_TABLET | Freq: Two times a day (BID) | ORAL | Status: DC
  Administered 2022-01-14 – 2022-01-18 (×9): 2.5 mg via ORAL
  Filled 2022-01-14 (×9): qty 1

## 2022-01-14 MED ORDER — GABAPENTIN 100 MG PO CAPS
100.0000 mg | ORAL_CAPSULE | Freq: Every day | ORAL | Status: DC
Start: 2022-01-14 — End: 2022-01-18
  Administered 2022-01-14 – 2022-01-17 (×4): 100 mg via ORAL
  Filled 2022-01-14 (×5): qty 1

## 2022-01-14 MED ORDER — ONDANSETRON HCL 4 MG/2ML IJ SOLN: 4.0000 mg | Freq: Four times a day (QID) | INTRAMUSCULAR | Status: DC | PRN

## 2022-01-14 MED ORDER — ESCITALOPRAM OXALATE 10 MG PO TABS
10.0000 mg | ORAL_TABLET | Freq: Every day | ORAL | Status: DC
Start: 2022-01-14 — End: 2022-01-18
  Administered 2022-01-14 – 2022-01-18 (×5): 10 mg via ORAL
  Filled 2022-01-14 (×5): qty 1

## 2022-01-14 MED ORDER — HYDRALAZINE HCL 50 MG PO TABS: 50.0000 mg | ORAL_TABLET | Freq: Every day | ORAL | Status: DC

## 2022-01-14 MED ORDER — LATANOPROST 0.005 % OP SOLN
1.0000 [drp] | Freq: Two times a day (BID) | OPHTHALMIC | Status: DC
  Administered 2022-01-14 – 2022-01-18 (×9): 1 [drp] via OPHTHALMIC
  Filled 2022-01-14: qty 2.5

## 2022-01-14 MED ORDER — LEVOTHYROXINE SODIUM 112 MCG PO TABS
112.0000 ug | ORAL_TABLET | Freq: Every day | ORAL | Status: DC
  Administered 2022-01-14 – 2022-01-18 (×5): 112 ug via ORAL
  Filled 2022-01-14 (×5): qty 1

## 2022-01-14 MED ORDER — ACETAMINOPHEN 325 MG PO TABS
650.0000 mg | ORAL_TABLET | ORAL | Status: DC | PRN
  Administered 2022-01-15 – 2022-01-17 (×3): 650 mg via ORAL
  Filled 2022-01-14 (×3): qty 2

## 2022-01-14 MED ORDER — SODIUM CHLORIDE 0.9% FLUSH
3.0000 mL | Freq: Two times a day (BID) | INTRAVENOUS | Status: DC
  Administered 2022-01-14 – 2022-01-18 (×9): 3 mL via INTRAVENOUS

## 2022-01-14 NOTE — Progress Notes (Signed)
Mobility Specialist Progress Note    01/14/22 1632  Mobility  Bed Position Chair  Activity Ambulated with assistance in hallway  Level of Assistance Contact guard assist, steadying assist  Assistive Device  (HHA)  Distance Ambulated (ft) 90 ft  Activity Response Tolerated fair  $Mobility charge 1 Mobility   Pre-Mobility: 66 HR, 91% SpO2 During Mobility: 86% SpO2 Post-Mobility: 66 HR, 92% on 2L SpO2  Pt received in bed and agreeable. Ambulated on 4LO2. Wears O2 at home. Returned to chair with call bell in reach and grandson present.   [facility] Mobility Specialist  M.S. 2C and 6E: [phone] M.S. 4E: [phone]

## 2022-01-14 NOTE — Assessment & Plan Note (Addendum)
TR

## 2022-01-14 NOTE — Progress Notes (Signed)
TRH short note  Admitted by my colleague this AM for complaints of increasing pedal edema, weakness and note to have hypoxia in the ED. She tells me she typically wears 2.5 L of O2 at home. On her usual home O2, her pulse ox at rest is 87% (checked by myself). Crackles noted at lung bases.  Plan: will give Lasix IV 40 mg Q 12 hrs and follow oxygenation. Continue to follow in the hospital.  Check ambulatory pulse ox in AM.  Debbe Odea, MD

## 2022-01-14 NOTE — ED Notes (Signed)
Pt took home dose of metoprolol 25 mg, eliquis 2.5 mg, prescription eye drops.  EDP Dykstra aware and agreeable.

## 2022-01-14 NOTE — Assessment & Plan Note (Addendum)
No abdominal pain, no nausea or vomiting.

## 2022-01-14 NOTE — Assessment & Plan Note (Signed)
Stable

## 2022-01-14 NOTE — H&P (Signed)
History and Physical    Veronica Young VPX:[id number] DOB: [birth year] DOA: 01/13/2022  PCP: Katherina Mires, MD   Patient coming from: Home  I have personally briefly reviewed patient's old medical records in [place]  Chief complaint: Lower extremity edema History of present illness: 88 year old female with a history of congenital deafness, chronic diastolic heart failure, history of A. fib on Eliquis, history of permanent pacemaker who was discharged in the hospital on 01/12/2022.  Patient states when she went home she noticed leg swelling.  She went to the ER yesterday on 01/13/2002 due to worsening swelling.  She denies any worsening shortness of breath.  She is on oxygen at home.  Chest x-ray showed mild pulmonary edema.  BNP was elevated to 767.  When she was admitted to the hospital for dehydration, her BNP was 942.   Patient given 20 mg IV Lasix.  Patient transferred to [facility].  Patient denies any chest pain.  She has had some nausea but no vomiting.  Patient is no longer having any diarrhea.  Daughter is not aware of the patient ever having a heart murmur.  Last echo was from April 2021 which showed an EF of 55 to 60%.  There is severe pulmonary artery hypertension.  RV systolic pressure 64 mm.  There is no evidence of aortic stenosis.  There is tricuspid regurg.   ED Course: CXR mild CHF. BNP 767. It was higher at 942 when she was admitted last week for dehydration/AKI.  Review of Systems:  Review of Systems  Constitutional:  Positive for malaise/fatigue. Negative for chills, fever and weight loss.  HENT: Negative.    Eyes: Negative.   Respiratory:  Negative for cough and shortness of breath.   Cardiovascular: Negative.   Gastrointestinal:  Positive for nausea. Negative for vomiting.  Genitourinary: Negative.   Musculoskeletal: Negative.   Skin:        Bilaterally LE edema  Neurological: Negative.   Endo/Heme/Allergies: Negative.    Psychiatric/Behavioral: Negative.    All other systems reviewed and are negative.  Past Medical History:  Diagnosis Date   Carotid bruit    CHF (congestive heart failure) (HCC)    Deafness    Diabetes mellitus    type 2   Diastolic heart failure (HCC)    EF is 60%   GERD (gastroesophageal reflux disease)    Glaucoma    Hypercholesterolemia    Hypertension    Hypothyroidism    Normal cardiac stress test January 2014   PAD (peripheral artery disease) ([place])    a. known bilateral popliteal occlusions with conservative management favored.   PAT (paroxysmal atrial tachycardia) (HCC)    Sick sinus syndrome [facility])    a. s/p PPM placement 07/2014    Past Surgical History:  Procedure Laterality Date   cholycystectomy     left breast cyst removed     PACEMAKER INSERTION  07-26-2014   MDT ADDRL1 pacemaker implanted by Dr Lovena Le for Gann N/A 07/26/2014   Procedure: PERMANENT PACEMAKER INSERTION;  Surgeon: Evans Lance, MD;  Location: [facility] CATH LAB;  Service: Cardiovascular;  Laterality: N/A;   TOTAL KNEE ARTHROPLASTY     right     reports that she has never smoked. She has never used smokeless tobacco. She reports that she does not drink alcohol and does not use drugs.  Allergies  Allergen Reactions   Amlodipine Nausea Only        Benicar [  Olmesartan Medoxomil] Nausea Only    Family History  Problem Relation Age of Onset   Heart disease Mother    Liver cancer Father    Heart disease Sister    Heart disease Brother    Stroke Sister     Prior to Admission medications   Medication Sig Start Date End Date Taking? Authorizing Provider  acetaminophen (TYLENOL) 500 MG tablet Take 1,000 mg by mouth every 6 (six) hours as needed for moderate pain.    [provider]  apixaban (ELIQUIS) 2.5 MG TABS tablet Take 1 tablet (2.5 mg total) by mouth 2 (two) times daily. 11/09/21   [name], Peter M, MD  atropine 1 % ophthalmic solution Place 1 drop  into the left eye at bedtime.     [provider]  brimonidine (ALPHAGAN) 0.2 % ophthalmic solution Place 1 drop into both eyes 2 (two) times daily. 05/20/17   [provider]  calcium-vitamin D (OSCAL WITH D) 500-200 MG-UNIT per tablet Take 1 tablet by mouth daily with breakfast.    [provider]  cholecalciferol (VITAMIN D) 1000 UNITS tablet Take 1,000 Units by mouth daily.    [provider]  escitalopram (LEXAPRO) 10 MG tablet Take 10 mg by mouth daily. 11/19/21   [provider]  feeding supplement (ENSURE ENLIVE / ENSURE PLUS) LIQD Take 237 mLs by mouth 3 (three) times daily between meals. 01/12/22   Pahwani, Michell Heinrich, MD  fluorometholone (FML) 0.1 % ophthalmic suspension Place 1 drop into the left eye 4 (four) times daily. 01/17/20   [provider]  furosemide (LASIX) 40 MG tablet TAKE 1 TABLET EVERY DAY Patient taking differently: Take 40 mg by mouth daily. 11/19/21   [name], Peter M, MD  gabapentin (NEURONTIN) 100 MG capsule Take 100 mg by mouth daily.  10/24/19   [provider]  hydrALAZINE (APRESOLINE) 50 MG tablet Take 1 tablet (50 mg total) by mouth daily. 01/08/22   [name], Peter M, MD  latanoprost (XALATAN) 0.005 % ophthalmic solution Place 1 drop into both eyes in the morning and at bedtime.  11/25/15   [provider]  levothyroxine (SYNTHROID, LEVOTHROID) 112 MCG tablet Take 112 mcg by mouth daily before breakfast.  03/31/14   [provider]  metoprolol tartrate (LOPRESSOR) 25 MG tablet Take 1 tablet (25 mg total) by mouth 2 (two) times daily. 01/11/21   [name], Peter M, MD  Multiple Vitamin (MULTIVITAMIN WITH MINERALS) TABS tablet Take 1 tablet by mouth daily. 01/12/22   Pahwani, Michell Heinrich, MD  ondansetron (ZOFRAN) 4 MG tablet Take 4 mg by mouth daily as needed for nausea. 01/07/22 01/07/23  [provider]  OXYGEN Inhale into the lungs. 2 liters as needed    [provider]  potassium  chloride SA (KLOR-CON) 20 MEQ tablet Take 1 tablet (20 mEq total) by mouth daily. Patient not taking: Reported on 01/09/2022 01/12/21 01/09/22  [name], Peter M, MD  RHOPRESSA 0.02 % SOLN Place 1 drop into the left eye at bedtime. 10/28/19   [provider]    Physical Exam: Vitals:   01/14/22 0120 01/14/22 0130 01/14/22 0200 01/14/22 0257  BP: (!) 90/53 (!) 97/49 92/61 98/60   Pulse: 60 61 (!) 59 62  Resp: 20 18 16    Temp:    97.8 F (36.6 C)  TempSrc:    Oral  SpO2: 94% 95% 94% (!) 88%  Weight:    60 kg  Height:    5\' 5"  (1.651 m)  Physical Exam Vitals and nursing note reviewed.  Constitutional:      General: She is not in acute distress.    Appearance: Normal appearance. She is not ill-appearing, toxic-appearing or diaphoretic.  HENT:     Head: Normocephalic and atraumatic.     Nose: Nose normal.  Cardiovascular:     Rate and Rhythm: Normal rate and regular rhythm.     Heart sounds: Murmur heard.  Crescendo systolic murmur is present with a grade of 3/6.  Pulmonary:     Effort: No respiratory distress.     Comments: Scattered rales bilaterally Abdominal:     General: Abdomen is flat. Bowel sounds are normal. There is no distension.     Palpations: Abdomen is soft.     Tenderness: There is no abdominal tenderness. There is no guarding.  Musculoskeletal:     Right lower leg: 2+ Pitting Edema present.     Left lower leg: 2+ Pitting Edema present.  Skin:    General: Skin is warm and dry.     Capillary Refill: Capillary refill takes less than 2 seconds.  Neurological:     Mental Status: She is alert and oriented to person, place, and time.     Labs on Admission: I have personally reviewed following labs and imaging studies  CBC: Recent Labs  Lab 01/08/22 2002 01/10/22 0013 01/11/22 0419 01/13/22 1939  WBC 6.3 5.4 4.3 6.7  NEUTROABS 4.2  --   --  4.6  HGB 12.6 10.0* 10.4* 12.9  HCT 38.9 31.1* 33.9* 39.6  MCV 85.7 88.1 90.2 86.7  PLT 132* 96* 102* 973    Basic Metabolic Panel: Recent Labs  Lab 01/09/22 1824 01/10/22 0013 01/10/22 0503 01/10/22 1159 01/11/22 0419 01/12/22 0552 01/13/22 1939  NA 132* 133* 133* 133* 132* 131* 127*  K 3.4* 3.2* 3.0* 3.1* 3.7 4.1 4.6  CL 98 99 99 100 103 101 95*  CO2 25 25 26 25 24 22 22   GLUCOSE 125* 117* 92 117* 90 103* 122*  BUN 30* 28* 28* 26* 23 25* 32*  CREATININE 1.85* 1.88* 1.75* 1.72* 1.62* 1.54* 1.61*  CALCIUM 8.6* 8.2* 8.4* 8.1* 7.9* 8.6* 9.1  MG  --   --   --   --  2.0 2.2  --   PHOS 3.4 3.0 3.2 3.1  --   --   --    GFR: Estimated Creatinine Clearance: 22.6 mL/min (A) (by C-G formula based on SCr of 1.61 mg/dL (H)). Liver Function Tests: Recent Labs  Lab 01/08/22 2002 01/09/22 1824 01/10/22 0013 01/10/22 0503 01/10/22 1159 01/11/22 0419 01/13/22 1939  AST 340*  --   --   --   --  242* 281*  ALT 174*  --   --   --   --  159* 232*  ALKPHOS 156*  --   --   --   --  111 178*  BILITOT 2.0* 1.4*  --   --   --  1.1 1.6*  PROT 6.9  --   --   --   --  5.4* 7.3  ALBUMIN 3.5 3.2* 2.6* 2.9* 2.6* 2.7* 3.5   Recent Labs  Lab 01/08/22 2002  LIPASE 54*   No results for input(s): AMMONIA in the last 168 hours. Coagulation Profile: No results for input(s): INR, PROTIME in the last 168 hours. Cardiac Enzymes: No results for input(s): CKTOTAL, CKMB, CKMBINDEX, TROPONINI in the last 168 hours. BNP (last 3 results) No results for input(s): PROBNP in  the last 8760 hours. HbA1C: No results for input(s): HGBA1C in the last 72 hours. CBG: No results for input(s): GLUCAP in the last 168 hours. Lipid Profile: No results for input(s): CHOL, HDL, LDLCALC, TRIG, CHOLHDL, LDLDIRECT in the last 72 hours. Thyroid Function Tests: No results for input(s): TSH, T4TOTAL, FREET4, T3FREE, THYROIDAB in the last 72 hours. Anemia Panel: No results for input(s): VITAMINB12, FOLATE, FERRITIN, TIBC, IRON, RETICCTPCT in the last 72 hours. Urine analysis:    Component Value Date/Time   COLORURINE YELLOW  01/08/2022 2230   APPEARANCEUR CLEAR 01/08/2022 2230   LABSPEC <1.005 (L) 01/08/2022 2230   PHURINE 5.5 01/08/2022 2230   GLUCOSEU NEGATIVE 01/08/2022 2230   HGBUR LARGE (A) 01/08/2022 2230   BILIRUBINUR NEGATIVE 01/08/2022 2230   KETONESUR NEGATIVE 01/08/2022 2230   PROTEINUR 30 (A) 01/08/2022 2230   UROBILINOGEN 0.2 11/27/2012 1120   NITRITE NEGATIVE 01/08/2022 2230   LEUKOCYTESUR NEGATIVE 01/08/2022 2230    Radiological Exams on Admission: I have personally reviewed images DG Chest Port 1 View  Result Date: 01/13/2022 CLINICAL DATA:  Weakness EXAM: PORTABLE CHEST 1 VIEW COMPARISON:  06/04/2021 FINDINGS: Left pacer remains in place, unchanged. Cardiomegaly. Vascular congestion diffuse interstitial opacities compatible with interstitial edema. Bibasilar atelectasis. No visible significant effusions. No acute bony abnormality. IMPRESSION: Cardiomegaly with vascular congestion and mild interstitial edema. Bibasilar atelectasis. Electronically Signed   By: Rolm Baptise M.D.   On: 01/13/2022 20:24    EKG: I have personally reviewed EKG: paced   Assessment/Plan Principal Problem:   Acute on chronic diastolic CHF (congestive heart failure) (HCC) Active Problems:   Anasarca   Cardiac murmur   Deafness   GERD (gastroesophageal reflux disease)   Hypothyroidism   Cardiac pacemaker in situ   Chronic anticoagulation   Chronic respiratory failure with hypoxia (HCC) - has home O2    Acute on chronic diastolic CHF (congestive heart failure) ([place]) Admit to telemetry bed.  Patient recently discharged from the hospital 1/2 days ago.  According the patient's I&Os she is about 4.5 L ahead and volume.  Patient was dehydrated on admission this week.  She did have diarrhea.  This is stopped.  Seems unlikely that 2 to 3 L of fluid would have pushed her into acute heart failure.  Daughter does not know anything about previous heart murmur.  She does have quite a loud systolic heart murmur.  Her last  echo was least 2 years ago.  Wonder if she has aortic stenosis.  We will hold on further diuresis.  Check echocardiogram.    Anasarca Patient with 2-3+ pitting edema of her lower extremities.  Hold on further diuresis until her cardiac status/echo can be evaluated.  Cardiac murmur Check echo.  Clinically her murmur is consistent with AS.  Deafness Chronic.  Patient uses American sign language.  Her daughter [name] also uses sign language.  Patient's daughter is not hearing impaired.  GERD (gastroesophageal reflux disease) Stable.  Hypothyroidism Chronic.  Cardiac pacemaker in situ Stable.  Chronic anticoagulation On Eliquis for A. fib.  Chronic respiratory failure with hypoxia (HCC) - has home O2 Patient using home O2 on a continuous basis.  This is not new.  DVT prophylaxis: Eliquis Code Status: Full Code Family Communication: discussed with pt and dtr rose at bedside  Disposition Plan: return home  Consults called: none  Admission status: Inpatient, Telemetry bed   Kristopher Oppenheim, DO [facility] 01/14/2022, 3:47 AM

## 2022-01-14 NOTE — ED Notes (Signed)
Patient's daughter updated on patient's room status.

## 2022-01-14 NOTE — Assessment & Plan Note (Signed)
Patient with 2-3+ pitting edema of her lower extremities.  Hold on further diuresis until her cardiac status/echo can be evaluated.

## 2022-01-14 NOTE — Assessment & Plan Note (Signed)
On Eliquis for A-fib ?

## 2022-01-14 NOTE — Subjective & Objective (Signed)
Chief complaint: Lower extremity edema History of present illness: 88 year old female with a history of congenital deafness, chronic diastolic heart failure, history of A. fib on Eliquis, history of permanent pacemaker who was discharged in the hospital on 01/12/2022.  Patient states when she went home she noticed leg swelling.  She went to the ER yesterday on 01/13/2002 due to worsening swelling.  She denies any worsening shortness of breath.  She is on oxygen at home.  Chest x-ray showed mild pulmonary edema.  BNP was elevated to 767.  When she was admitted to the hospital for dehydration, her BNP was 942.   Patient given 20 mg IV Lasix.  Patient transferred to [facility].  Patient denies any chest pain.  She has had some nausea but no vomiting.  Patient is no longer having any diarrhea.  Daughter is not aware of the patient ever having a heart murmur.  Last echo was from April 2021 which showed an EF of 55 to 60%.  There is severe pulmonary artery hypertension.  RV systolic pressure 64 mm.  There is no evidence of aortic stenosis.  There is tricuspid regurg.

## 2022-01-14 NOTE — Assessment & Plan Note (Addendum)
Echocardiogram with preserved LV systolic function, EF 60 to 65% Reduced RV systolic function. Significant tricuspid regurgitation, RV size is mildly enlarged. RA severe dilatation. Tricuspid stenosis.   Patient with persistent edema, case discussed with Dr Sallyanne Kuster and plan to continue diuresis with IV furosemide.  Patient with pulmonary hypertension.

## 2022-01-14 NOTE — Assessment & Plan Note (Addendum)
Stable continue with levothyroxine

## 2022-01-14 NOTE — ED Notes (Signed)
Attempted report to [facility] 6E, RN unable to receive report at this time. Advised they will call back.

## 2022-01-14 NOTE — Assessment & Plan Note (Addendum)
Interpreter at the bedside

## 2022-01-14 NOTE — Progress Notes (Signed)
Mobility Specialist Progress Note    01/14/22 1103  Mobility  Activity Ambulated with assistance in room  Level of Assistance Contact guard assist, steadying assist  Assistive Device  (HHA)  Distance Ambulated (ft) 15 ft  Activity Response Tolerated well  $Mobility charge 1 Mobility   Pt received in bed and wanting to attempt BM. Pt was successful on BSC. Left in chair with call bell in reach and daughter present.   [facility] Mobility Specialist  M.S. 2C and 6E: [phone] M.S. 4E: [phone]

## 2022-01-14 NOTE — Progress Notes (Signed)
SATURATION QUALIFICATIONS: (This note is used to comply with regulatory documentation for home oxygen)  Patient Saturations on Room Air at Rest = 86%  Patient Saturations on 4 Liters of oxygen while Ambulating = 90%

## 2022-01-14 NOTE — Assessment & Plan Note (Addendum)
Pulmonary hypertension with acute on chronic hypoxemic respiratory failure.  At home patient on 2,5 L/min per [place] of supplemental 02, Patient has been on 4 L with 02 saturation 96%, plan to decrease to 3 L per min of supplemental 02. Target 02 saturation 88% or greater.

## 2022-01-15 DIAGNOSIS — I2721 Secondary pulmonary arterial hypertension: Secondary | ICD-10-CM

## 2022-01-15 DIAGNOSIS — I5033 Acute on chronic diastolic (congestive) heart failure: Secondary | ICD-10-CM | POA: Diagnosis not present

## 2022-01-15 DIAGNOSIS — D6869 Other thrombophilia: Secondary | ICD-10-CM

## 2022-01-15 DIAGNOSIS — Z950 Presence of cardiac pacemaker: Secondary | ICD-10-CM | POA: Diagnosis not present

## 2022-01-15 LAB — BASIC METABOLIC PANEL
Anion gap: 8 (ref 5–15)
BUN: 34 mg/dL — ABNORMAL HIGH (ref 8–23)
CO2: 26 mmol/L (ref 22–32)
Calcium: 8.5 mg/dL — ABNORMAL LOW (ref 8.9–10.3)
Chloride: 98 mmol/L (ref 98–111)
Creatinine, Ser: 1.55 mg/dL — ABNORMAL HIGH (ref 0.44–1.00)
GFR, Estimated: 32 mL/min — ABNORMAL LOW (ref >60)
Glucose, Bld: 127 mg/dL — ABNORMAL HIGH (ref 70–99)
Potassium: 3.8 mmol/L (ref 3.5–5.1)
Sodium: 132 mmol/L — ABNORMAL LOW (ref 135–145)

## 2022-01-15 MED ORDER — METOPROLOL TARTRATE 12.5 MG HALF TABLET
12.5000 mg | ORAL_TABLET | Freq: Two times a day (BID) | ORAL | Status: DC
  Administered 2022-01-15 – 2022-01-18 (×6): 12.5 mg via ORAL
  Filled 2022-01-15 (×6): qty 1

## 2022-01-15 MED ORDER — FUROSEMIDE 10 MG/ML IJ SOLN
40.0000 mg | Freq: Once | INTRAMUSCULAR | Status: AC
  Administered 2022-01-15: 18:00:00 40 mg via INTRAVENOUS
  Filled 2022-01-15: qty 4

## 2022-01-15 MED ORDER — MUSCLE RUB 10-15 % EX CREA
TOPICAL_CREAM | CUTANEOUS | Status: DC | PRN
  Filled 2022-01-15: qty 85

## 2022-01-15 NOTE — TOC Initial Note (Signed)
Transition of Care [facility]) - Initial/Assessment Note    Patient Details  Name: Veronica Young MRN: [id number] Date of Birth: 1933/05/23  [facility]) CM/SW Contact:    Bethena Roys, RN Phone Number: 01/15/2022, 3:18 PM  Clinical Narrative: Patient is currently active with [facility] for RN, PT,OT- patient will need resumption orders once stable with F2F. Case Manager did call [facility] to make them aware of hospitalization-office to follow. Case Manager will continue to follow for additional transition of care needs.                Expected Discharge Plan: [place] Barriers to Discharge: Continued Medical Work up  Expected Discharge Plan and Services Expected Discharge Plan: [place]   Discharge Planning Services: CM Consult Post Acute Care Choice: [place], Resumption of Svcs/PTA Provider Living arrangements for the past 2 months: Single Family Home                 DME Arranged: N/A DME Agency: NA       HH Arranged: RN, PT, OT HH Agency: [facility] Date [place] Agency Contacted: 01/15/22 Time HH Agency Contacted: 1517 Representative spoke with at [place]: [name]  Prior Living Arrangements/Services Living arrangements for the past 2 months: [place]   Patient language and need for interpreter reviewed:: Yes Do you feel safe going back to the place where you live?: Yes      Need for Family Participation in Patient Care: Yes (Comment) Care giver support system in place?: Yes (comment)   Criminal Activity/Legal Involvement Pertinent to Current Situation/Hospitalization: No - Comment as needed  Activities of Daily Living Home Assistive Devices/Equipment: Eyeglasses ADL Screening (condition at time of admission) Patient's cognitive ability adequate to safely complete daily activities?: Yes Is the patient deaf or have difficulty hearing?: Yes Does the patient have difficulty seeing,  even when wearing glasses/contacts?: Yes Does the patient have difficulty concentrating, remembering, or making decisions?: Yes Patient able to express need for assistance with ADLs?: No Does the patient have difficulty dressing or bathing?: No Independently performs ADLs?: Yes (appropriate for developmental age) Does the patient have difficulty walking or climbing stairs?: Yes Weakness of Legs: Both Weakness of Arms/Hands: None  Permission Sought/Granted Permission sought to share information with : Family Supports, Customer service manager, Case Optician, dispensing granted to share information with : Yes, Verbal Permission Granted     Permission granted to share info w AGENCY: [facility]        Emotional Assessment Appearance:: Appears stated age Attitude/Demeanor/Rapport: Engaged Affect (typically observed): Appropriate Orientation: : Oriented to Situation, Oriented to  Time, Oriented to Place, Oriented to Self Alcohol / Substance Use: Not Applicable Psych Involvement: No (comment)  Admission diagnosis:  Lower extremity edema [R60.0] SOB (shortness of breath) [R06.02] CHF exacerbation (HCC) [I50.9] Acute respiratory failure with hypoxia ([place]) [J96.01] Hypervolemia, unspecified hypervolemia type [E87.70] Patient Active Problem List   Diagnosis Date Noted   Anasarca 01/14/2022   Chronic respiratory failure with hypoxia ([place]) - has home O2 01/14/2022   Cardiac murmur 01/14/2022   Malnutrition of moderate degree 01/11/2022   Chronic anticoagulation 05/24/2020   CRI (chronic renal insufficiency), stage 3 (moderate) ([place]) 05/24/2020   Acute on chronic diastolic CHF (congestive heart failure) ([place]) 04/02/2020   CAD in native artery 06/16/2017   Persistent atrial fibrillation ([place]) 06/16/2017   Sinus node dysfunction ([place]) 09/10/2016   Cardiac pacemaker in  situ 11/08/2014   HTN (hypertension) 09/22/2014   Hypertensive urgency 09/15/2014   Chest pain 09/15/2014   Chronic  diastolic CHF (congestive heart failure) ([place]) 11/27/2012   PAD (peripheral artery disease) ([place]) 03/18/2012   Claudication ([place]) 02/19/2012   Atrial tachycardia ([place]) 06/27/2011   Hypercholesterolemia    Deafness    Carotid bruit    GERD (gastroesophageal reflux disease)    Hypothyroidism    PCP:  Katherina Mires, MD Pharmacy:   [id number] [place], [address] [place] [address] Phone: [phone] Fax: [phone]  [id number] - [place], [place] [address] [place] [address] Phone: [phone] Fax: [phone]  [facility] - [address], [place] [address] Phone: [phone] Fax: [phone]     Social Determinants of Health (SDOH) Interventions    Readmission Risk Interventions No flowsheet data found.

## 2022-01-15 NOTE — Progress Notes (Signed)
PROGRESS NOTE    Veronica Young   QQV:[id number]  DOB: Apr 27, 1933  DOA: 01/13/2022 PCP: Katherina Mires, MD   Brief Narrative:  Veronica B Capes88 year old female with a history of congenital deafness, chronic diastolic heart failure, history of A. fib on Eliquis, history of permanent pacemaker who was discharged from the hospital on 01/12/2022 after being treated with IV fluids for dehydration.  She returns to the hospital for swelling  up to her thighs and weakness along with fatigue.   Subjective: She is sitting in bed and has no complaints.    Assessment & Plan:   Principal Problem:   Acute on chronic diastolic CHF (congestive heart failure) (HCC) Pacemaker, atrial fibrillation on Eliquis -Per daughter, oxygen is used "as needed" for low oxygen levels that they measure themselves - Felt to be in acute heart failure on admission -given a couple of IV Lasix doses however, she was hypotensive I was not overly aggressive with Lasix - 2D echo obtained reveals > Significant TR cannot r/o that pacing lead is tenting one of the tricuspid leaflets  -Have requested a cardiology eval-she follows with [facility] -She is rate controlled-continue metoprolol - DC hydralazine due to hypotension  Active Problems:   Deafness -Needs ALS interpreter  Recent GI illness -Has no complaints of this any longer    Hypothyroidism -Continue Synthroid    DVT prophylaxis: Eliquis Family Communication: With daughter at bedside today Level of Care: Level of care: Telemetry Cardiac Disposition Plan:  Status is: Inpatient  Remains inpatient appropriate because: Needs cardiology eval  Consultants:  Cardiology  Antimicrobials:  Anti-infectives (From admission, onward)    None        Objective: Vitals:   01/15/22 0528 01/15/22 0750 01/15/22 1225 01/15/22 1305  BP: (!) 83/57 (!) 92/52 (!) 80/45 (!) 85/52  Pulse: 60  67   Resp: 17 20 19 18   Temp: 98.2 F  (36.8 C) 97.9 F (36.6 C) 97.9 F (36.6 C)   TempSrc: Oral Oral Oral   SpO2: 94% 94% 92% 94%  Weight:      Height:        Intake/Output Summary (Last 24 hours) at 01/15/2022 1433 Last data filed at 01/15/2022 0754 Gross per 24 hour  Intake 100 ml  Output 150 ml  Net -50 ml   Filed Weights   01/14/22 0257 01/15/22 0500  Weight: 60 kg 60 kg    Examination: General exam: Appears comfortable at rest HEENT: oral mucosa moist, no sclera icterus or thrush Respiratory system: crackles at bases- pulse ox 87-90 on room air checked by myself Cardiovascular system: S1 & S2 heard, RRR.   Gastrointestinal system: Abdomen soft, non-tender, nondistended. Normal bowel sounds. Central nervous system: Alert and oriented. No focal neurological deficits. Extremities: No cyanosis, clubbing + pitting edema up to thighs Psychiatry:  Mood & affect appropriate.     Data Reviewed: I have personally reviewed following labs and imaging studies  CBC: Recent Labs  Lab 01/08/22 2002 01/10/22 0013 01/11/22 0419 01/13/22 1939  WBC 6.3 5.4 4.3 6.7  NEUTROABS 4.2  --   --  4.6  HGB 12.6 10.0* 10.4* 12.9  HCT 38.9 31.1* 33.9* 39.6  MCV 85.7 88.1 90.2 86.7  PLT 132* 96* 102* 332   Basic Metabolic Panel: Recent Labs  Lab 01/09/22 1824 01/10/22 0013 01/10/22 0503 01/10/22 1159 01/11/22 0419 01/12/22 0552 01/13/22 1939 01/14/22 1010 01/15/22 0252  NA 132* 133* 133* 133* 132* 131* 127* 130* 132*  K 3.4* 3.2* 3.0* 3.1* 3.7 4.1 4.6 3.9 3.8  CL 98 99 99 100 103 101 95* 98 98  CO2 25 25 26 25 24 22 22 23 26   GLUCOSE 125* 117* 92 117* 90 103* 122* 129* 127*  BUN 30* 28* 28* 26* 23 25* 32* 31* 34*  CREATININE 1.85* 1.88* 1.75* 1.72* 1.62* 1.54* 1.61* 1.72* 1.55*  CALCIUM 8.6* 8.2* 8.4* 8.1* 7.9* 8.6* 9.1 8.8* 8.5*  MG  --   --   --   --  2.0 2.2  --   --   --   PHOS 3.4 3.0 3.2 3.1  --   --   --   --   --    GFR: Estimated Creatinine Clearance: 23.4 mL/min (A) (by C-G formula based on SCr of  1.55 mg/dL (H)). Liver Function Tests: Recent Labs  Lab 01/08/22 2002 01/09/22 1824 01/10/22 0013 01/10/22 0503 01/10/22 1159 01/11/22 0419 01/13/22 1939  AST 340*  --   --   --   --  242* 281*  ALT 174*  --   --   --   --  159* 232*  ALKPHOS 156*  --   --   --   --  111 178*  BILITOT 2.0* 1.4*  --   --   --  1.1 1.6*  PROT 6.9  --   --   --   --  5.4* 7.3  ALBUMIN 3.5 3.2* 2.6* 2.9* 2.6* 2.7* 3.5   Recent Labs  Lab 01/08/22 2002  LIPASE 54*   No results for input(s): AMMONIA in the last 168 hours. Coagulation Profile: No results for input(s): INR, PROTIME in the last 168 hours. Cardiac Enzymes: No results for input(s): CKTOTAL, CKMB, CKMBINDEX, TROPONINI in the last 168 hours. BNP (last 3 results) No results for input(s): PROBNP in the last 8760 hours. HbA1C: No results for input(s): HGBA1C in the last 72 hours. CBG: No results for input(s): GLUCAP in the last 168 hours. Lipid Profile: No results for input(s): CHOL, HDL, LDLCALC, TRIG, CHOLHDL, LDLDIRECT in the last 72 hours. Thyroid Function Tests: No results for input(s): TSH, T4TOTAL, FREET4, T3FREE, THYROIDAB in the last 72 hours. Anemia Panel: No results for input(s): VITAMINB12, FOLATE, FERRITIN, TIBC, IRON, RETICCTPCT in the last 72 hours. Urine analysis:    Component Value Date/Time   COLORURINE YELLOW 01/08/2022 2230   APPEARANCEUR CLEAR 01/08/2022 2230   LABSPEC <1.005 (L) 01/08/2022 2230   PHURINE 5.5 01/08/2022 2230   GLUCOSEU NEGATIVE 01/08/2022 2230   HGBUR LARGE (A) 01/08/2022 2230   BILIRUBINUR NEGATIVE 01/08/2022 2230   KETONESUR NEGATIVE 01/08/2022 2230   PROTEINUR 30 (A) 01/08/2022 2230   UROBILINOGEN 0.2 11/27/2012 1120   NITRITE NEGATIVE 01/08/2022 2230   LEUKOCYTESUR NEGATIVE 01/08/2022 2230   Sepsis Labs: @LABRCNTIP (procalcitonin:4,lacticidven:4) ) Recent Results (from the past 240 hour(s))  Resp Panel by RT-PCR (Flu A&B, Covid) Nasopharyngeal Swab     Status: None   Collection  Time: 01/08/22  8:09 PM   Specimen: Nasopharyngeal Swab; Nasopharyngeal(NP) swabs in vial transport medium  Result Value Ref Range Status   SARS Coronavirus 2 by RT PCR NEGATIVE NEGATIVE Final    Comment: (NOTE) SARS-CoV-2 target nucleic acids are NOT DETECTED.  The SARS-CoV-2 RNA is generally detectable in upper respiratory specimens during the acute phase of infection. The lowest concentration of SARS-CoV-2 viral copies this assay can detect is 138 copies/mL. A negative result does not preclude SARS-Cov-2 infection and should not be used as the  sole basis for treatment or other patient management decisions. A negative result may occur with  improper specimen collection/handling, submission of specimen other than nasopharyngeal swab, presence of viral mutation(s) within the areas targeted by this assay, and inadequate number of viral copies(<138 copies/mL). A negative result must be combined with clinical observations, patient history, and epidemiological information. The expected result is Negative.  Fact Sheet for Patients:  EntrepreneurPulse.com.au  Fact Sheet for Healthcare Providers:  IncredibleEmployment.be  This test is no t yet approved or cleared by the [place] FDA and  has been authorized for detection and/or diagnosis of SARS-CoV-2 by FDA under an Emergency Use Authorization (EUA). This EUA will remain  in effect (meaning this test can be used) for the duration of the COVID-19 declaration under Section 564(b)(1) of the Act, 21 U.S.C.section 360bbb-3(b)(1), unless the authorization is terminated  or revoked sooner.       Influenza A by PCR NEGATIVE NEGATIVE Final   Influenza B by PCR NEGATIVE NEGATIVE Final    Comment: (NOTE) The Xpert Xpress SARS-CoV-2/FLU/RSV plus assay is intended as an aid in the diagnosis of influenza from Nasopharyngeal swab specimens and should not be used as a sole basis for treatment. Nasal washings  and aspirates are unacceptable for Xpert Xpress SARS-CoV-2/FLU/RSV testing.  Fact Sheet for Patients: EntrepreneurPulse.com.au  Fact Sheet for Healthcare Providers: IncredibleEmployment.be  This test is not yet approved or cleared by the [place] FDA and has been authorized for detection and/or diagnosis of SARS-CoV-2 by FDA under an Emergency Use Authorization (EUA). This EUA will remain in effect (meaning this test can be used) for the duration of the COVID-19 declaration under Section 564(b)(1) of the Act, 21 U.S.C. section 360bbb-3(b)(1), unless the authorization is terminated or revoked.  Performed at [facility], [address] [place], [place] [address]   Resp Panel by RT-PCR (Flu A&B, Covid) Nasopharyngeal Swab     Status: None   Collection Time: 01/13/22  7:39 PM   Specimen: Nasopharyngeal Swab; Nasopharyngeal(NP) swabs in vial transport medium  Result Value Ref Range Status   SARS Coronavirus 2 by RT PCR NEGATIVE NEGATIVE Final    Comment: (NOTE) SARS-CoV-2 target nucleic acids are NOT DETECTED.  The SARS-CoV-2 RNA is generally detectable in upper respiratory specimens during the acute phase of infection. The lowest concentration of SARS-CoV-2 viral copies this assay can detect is 138 copies/mL. A negative result does not preclude SARS-Cov-2 infection and should not be used as the sole basis for treatment or other patient management decisions. A negative result may occur with  improper specimen collection/handling, submission of specimen other than nasopharyngeal swab, presence of viral mutation(s) within the areas targeted by this assay, and inadequate number of viral copies(<138 copies/mL). A negative result must be combined with clinical observations, patient history, and epidemiological information. The expected result is Negative.  Fact Sheet for Patients:  EntrepreneurPulse.com.au  Fact  Sheet for Healthcare Providers:  IncredibleEmployment.be  This test is no t yet approved or cleared by the [place] FDA and  has been authorized for detection and/or diagnosis of SARS-CoV-2 by FDA under an Emergency Use Authorization (EUA). This EUA will remain  in effect (meaning this test can be used) for the duration of the COVID-19 declaration under Section 564(b)(1) of the Act, 21 U.S.C.section 360bbb-3(b)(1), unless the authorization is terminated  or revoked sooner.       Influenza A by PCR NEGATIVE NEGATIVE Final   Influenza B by PCR NEGATIVE NEGATIVE Final  Comment: (NOTE) The Xpert Xpress SARS-CoV-2/FLU/RSV plus assay is intended as an aid in the diagnosis of influenza from Nasopharyngeal swab specimens and should not be used as a sole basis for treatment. Nasal washings and aspirates are unacceptable for Xpert Xpress SARS-CoV-2/FLU/RSV testing.  Fact Sheet for Patients: EntrepreneurPulse.com.au  Fact Sheet for Healthcare Providers: IncredibleEmployment.be  This test is not yet approved or cleared by the [place] FDA and has been authorized for detection and/or diagnosis of SARS-CoV-2 by FDA under an Emergency Use Authorization (EUA). This EUA will remain in effect (meaning this test can be used) for the duration of the COVID-19 declaration under Section 564(b)(1) of the Act, 21 U.S.C. section 360bbb-3(b)(1), unless the authorization is terminated or revoked.  Performed at [facility], [address], [place], [place] [address]          Radiology Studies: DG Chest [place] 1 View  Result Date: 01/13/2022 CLINICAL DATA:  Weakness EXAM: PORTABLE CHEST 1 VIEW COMPARISON:  06/04/2021 FINDINGS: Left pacer remains in place, unchanged. Cardiomegaly. Vascular congestion diffuse interstitial opacities compatible with interstitial edema. Bibasilar atelectasis. No visible significant effusions. No  acute bony abnormality. IMPRESSION: Cardiomegaly with vascular congestion and mild interstitial edema. Bibasilar atelectasis. Electronically Signed   By: Rolm Baptise M.D.   On: 01/13/2022 20:24   ECHOCARDIOGRAM COMPLETE  Result Date: 01/14/2022    ECHOCARDIOGRAM REPORT   Patient Name:   CHYENNE SOBCZAK [place] Date of Exam: 01/14/2022 Medical Rec #:  [id number]      Height:       65.0 in Accession #:    [id number]     Weight:       132.3 lb Date of Birth:  Jul 05, 1933     BSA:          1.659 m Patient Age:    39 years       BP:           90/52 mmHg Patient Gender: F              HR:           66 bpm. Exam Location:  Inpatient Procedure: 2D Echo, Cardiac Doppler and Color Doppler Indications:    CHF  History:        Patient has prior history of Echocardiogram examinations, most                 recent 04/03/2020. CAD, Pacemaker, Signs/Symptoms:Chest Pain;                 Risk Factors:Hypertension.  Sonographer:    Glo Herring Referring Phys: [place]  1. Left ventricular ejection fraction, by estimation, is 60 to 65%. The left ventricle has normal function. The left ventricle has no regional wall motion abnormalities. There is mild left ventricular hypertrophy. Left ventricular diastolic parameters are indeterminate.  2. Significant TR cannot r/o that pacing lead is tenting one of the tricuspid leaflets Image 18 The RV lead is a medtronic MRI safe 5076 and appears very taut with little slack . Right ventricular systolic function is mildly reduced. The right ventricular size is mildly enlarged.  3. Left atrial size was mildly dilated.  4. Right atrial size was severely dilated.  5. The pericardial effusion is posterior to the left ventricle.  6. The mitral valve is abnormal. Trivial mitral valve regurgitation. No evidence of mitral stenosis.  7. Tricuspid valve regurgitation is moderate. Severe tricuspid stenosis.  8. The aortic valve is tricuspid.  There is mild calcification of the aortic valve. Aortic  valve regurgitation is not visualized. Aortic valve sclerosis is present, with no evidence of aortic valve stenosis.  9. The inferior vena cava is dilated in size with <50% respiratory variability, suggesting right atrial pressure of 15 mmHg. FINDINGS  Left Ventricle: Left ventricular ejection fraction, by estimation, is 60 to 65%. The left ventricle has normal function. The left ventricle has no regional wall motion abnormalities. The left ventricular internal cavity size was normal in size. There is  mild left ventricular hypertrophy. Left ventricular diastolic parameters are indeterminate. Right Ventricle: Significant TR cannot r/o that pacing lead is tenting one of the tricuspid leaflets Image 18 The RV lead is a medtronic MRI safe 5076 and appears very taut with little slack. The right ventricular size is mildly enlarged. No increase in right ventricular wall thickness. Right ventricular systolic function is mildly reduced. Left Atrium: Left atrial size was mildly dilated. Right Atrium: Right atrial size was severely dilated. Pericardium: Trivial pericardial effusion is present. The pericardial effusion is posterior to the left ventricle. Mitral Valve: The mitral valve is abnormal. There is moderate thickening of the mitral valve leaflet(s). Trivial mitral valve regurgitation. No evidence of mitral valve stenosis. Tricuspid Valve: The tricuspid valve is normal in structure. Tricuspid valve regurgitation is moderate . Severe tricuspid stenosis. Aortic Valve: The aortic valve is tricuspid. There is mild calcification of the aortic valve. Aortic valve regurgitation is not visualized. Aortic valve sclerosis is present, with no evidence of aortic valve stenosis. Aortic valve mean gradient measures 2.0 mmHg. Aortic valve peak gradient measures 3.0 mmHg. Aortic valve area, by VTI measures 1.89 cm. Pulmonic Valve: The pulmonic valve was normal in structure. Pulmonic valve regurgitation is trivial. No evidence of  pulmonic stenosis. Aorta: The aortic root is normal in size and structure. Venous: The inferior vena cava is dilated in size with less than 50% respiratory variability, suggesting right atrial pressure of 15 mmHg. IAS/Shunts: No atrial level shunt detected by color flow Doppler. Additional Comments: A device lead is visualized.  LEFT VENTRICLE PLAX 2D LVIDd:         3.10 cm LVIDs:         2.10 cm LV PW:         1.10 cm LV IVS:        1.30 cm LVOT diam:     1.90 cm LV SV:         38 LV SV Index:   23 LVOT Area:     2.84 cm  RIGHT VENTRICLE            IVC RV Basal diam:  4.00 cm    IVC diam: 3.00 cm RV Mid diam:    3.00 cm RV S prime:     6.83 cm/s LEFT ATRIUM           Index        RIGHT ATRIUM           Index LA diam:      3.80 cm 2.29 cm/m   RA Area:     21.70 cm LA Vol (A4C): 51.8 ml 31.22 ml/m  RA Volume:   60.50 ml  36.46 ml/m  AORTIC VALVE                    PULMONIC VALVE AV Area (Vmax):    1.91 cm     PV Vmax:       0.64 m/s AV  Area (Vmean):   1.81 cm     PV Peak grad:  1.6 mmHg AV Area (VTI):     1.89 cm AV Vmax:           87.07 cm/s AV Vmean:          63.500 cm/s AV VTI:            0.204 m AV Peak Grad:      3.0 mmHg AV Mean Grad:      2.0 mmHg LVOT Vmax:         58.50 cm/s LVOT Vmean:        40.433 cm/s LVOT VTI:          0.136 m LVOT/AV VTI ratio: 0.67  AORTA Ao Root diam: 2.90 cm Ao Asc diam:  3.10 cm TRICUSPID VALVE TR Peak grad:   96.4 mmHg TR Vmax:        491.00 cm/s  SHUNTS Systemic VTI:  0.14 m Systemic Diam: 1.90 cm Jenkins Rouge MD Electronically signed by Jenkins Rouge MD Signature Date/Time: 01/14/2022/1:37:06 PM    Final       Scheduled Meds:  apixaban  2.5 mg Oral BID   brimonidine  1 drop Both Eyes BID   escitalopram  10 mg Oral Daily   fluorometholone  1 drop Left Eye QID   gabapentin  100 mg Oral Daily   hydrALAZINE  25 mg Oral BID   latanoprost  1 drop Both Eyes BID   levothyroxine  112 mcg Oral Q0600   metoprolol tartrate  25 mg Oral BID   sodium chloride flush  3 mL  Intravenous Q12H   Continuous Infusions:  sodium chloride       LOS: 1 day      Debbe Odea, MD [facility] Pager: [email or web address] 01/15/2022, 2:33 PM

## 2022-01-15 NOTE — Consult Note (Addendum)
Cardiology Consultation:   Patient ID: Veronica Young MRN: [id number]; DOB: 08/03/1933  Admit date: 01/13/2022 Date of Consult: 01/15/2022  PCP:  Katherina Mires, MD   [facility] Providers Cardiologist:  Peter [name], MD  Electrophysiologist:  Cristopher Peru, MD       Patient Profile:   Veronica Young is a 88 y.o. female with a hx of afib on eliquis, SSS s/p PPM (2015, medtronic), PAD, CKD3A, CAD, hypothyroidism, HLD, HTN, chronic diastolic HF, congenital deafness who is being seen 01/15/2022 for the evaluation of CHF at the request of Dr. Wynelle Cleveland.  History of Present Illness:   Veronica Young underwent a cardiac cath on 09/28/2004 that showed moderate nonobstructive CAD. Most recent ischemic evaluation was a stress test in 2014 that was a normal study. In 2015, patient developed symptomatic bradycardia and had a medtronic dual chamber pacemaker implanted on 07/27/2014. Most recent echo was completed on 04/03/2020 and showed LV function 55-60%, normal LV function, normal Rv function, RVSP elevated to 64.3 mmHg, moderate MR. Patient was last seen by cardiology on 11/13/21. At that visit, patient was doing well. Had continued to use oxygen on occasion, had some SOB while walking. Patient was hospitalized from 01/08/22-01/12/2022 at [facility] for treatment of acute hypovolemic AKI, abdominal pain/n/v. Patient was given IV fluids with improvement of AKI.    Patient presented to the ED on 1/22, one day after being discharged. Patient complained of weakness, worsening leg swelling.  Labs in the ED showed Na 127 (down from 131 on prior admission)  K 4.6  Creatinine 1.6 (similar to baseline)  BUN 32 (up from 25 on prior admission)  AST 281  ALT 232  eGFR 31 Hemoglobin 12.9 WBC 6.7 BNP 767.6 (down from 942.1 on last admission)  D-Dimer elevated to 2.35  CXR showed cardiomegaly with vascular congestion and mild interstitial edema  EKG showed afib with V paced complexes, rate  68 Echo from this admission showed LVEF 60-65% (up from 55-60% on 04/03/20), mopderate TR (cannot rule out that pacing lead is tenting one of the tricuspid leaflets),severe tricuspid stenosis, severely dilated RA.    On interview, patient denied any chest pain, SOB, headache, palpitations, dizziness. Reported swelling in her ankles.   Past Medical History:  Diagnosis Date   Carotid bruit    CHF (congestive heart failure) (HCC)    Deafness    Diabetes mellitus    type 2   Diastolic heart failure (HCC)    EF is 60%   GERD (gastroesophageal reflux disease)    Glaucoma    Hypercholesterolemia    Hypertension    Hypothyroidism    Normal cardiac stress test January 2014   PAD (peripheral artery disease) ([place])    a. known bilateral popliteal occlusions with conservative management favored.   PAT (paroxysmal atrial tachycardia) (HCC)    Sick sinus syndrome [facility])    a. s/p PPM placement 07/2014    Past Surgical History:  Procedure Laterality Date   cholycystectomy     left breast cyst removed     PACEMAKER INSERTION  07-26-2014   MDT ADDRL1 pacemaker implanted by Dr Lovena Le for [place] N/A 07/26/2014   Procedure: PERMANENT PACEMAKER INSERTION;  Surgeon: Evans Lance, MD;  Location: [facility] CATH LAB;  Service: Cardiovascular;  Laterality: N/A;   TOTAL KNEE ARTHROPLASTY     right     Home Medications:  Prior to Admission medications   Medication Sig Start Date End  Date Taking? Authorizing Provider  acetaminophen (TYLENOL) 500 MG tablet Take 1,000 mg by mouth every 6 (six) hours as needed for moderate pain.   Yes [provider]  apixaban (ELIQUIS) 2.5 MG TABS tablet Take 1 tablet (2.5 mg total) by mouth 2 (two) times daily. 11/09/21  Yes [name], Peter M, MD  atropine 1 % ophthalmic solution Place 1 drop into the left eye at bedtime.    Yes [provider]  brimonidine (ALPHAGAN) 0.2 % ophthalmic solution Place 1 drop into both eyes 2 (two) times  daily. 05/20/17  Yes [provider]  calcium-vitamin D (OSCAL WITH D) 500-200 MG-UNIT per tablet Take 1 tablet by mouth daily with breakfast.   Yes [provider]  cholecalciferol (VITAMIN D) 1000 UNITS tablet Take 1,000 Units by mouth daily.   Yes [provider]  escitalopram (LEXAPRO) 10 MG tablet Take 10 mg by mouth daily. 11/19/21  Yes [provider]  feeding supplement (ENSURE ENLIVE / ENSURE PLUS) LIQD Take 237 mLs by mouth 3 (three) times daily between meals. 01/12/22  Yes Pahwani, Rinka R, MD  fluorometholone (FML) 0.1 % ophthalmic suspension Place 1 drop into the left eye 4 (four) times daily. 01/17/20  Yes [provider]  furosemide (LASIX) 40 MG tablet TAKE 1 TABLET EVERY DAY Patient taking differently: Take 40 mg by mouth daily. 11/19/21  Yes [name], Peter M, MD  gabapentin (NEURONTIN) 100 MG capsule Take 100 mg by mouth at bedtime. 10/24/19  Yes [provider]  hydrALAZINE (APRESOLINE) 50 MG tablet Take 1 tablet (50 mg total) by mouth daily. 01/08/22  Yes [name], Peter M, MD  latanoprost (XALATAN) 0.005 % ophthalmic solution Place 1 drop into both eyes in the morning and at bedtime.  11/25/15  Yes [provider]  levothyroxine (SYNTHROID, LEVOTHROID) 112 MCG tablet Take 112 mcg by mouth daily before breakfast.  03/31/14  Yes [provider]  metoprolol tartrate (LOPRESSOR) 25 MG tablet Take 1 tablet (25 mg total) by mouth 2 (two) times daily. 01/11/21  Yes [name], Peter M, MD  Multiple Vitamin (MULTIVITAMIN WITH MINERALS) TABS tablet Take 1 tablet by mouth daily. 01/12/22  Yes Pahwani, Rinka R, MD  ondansetron (ZOFRAN) 4 MG tablet Take 4 mg by mouth daily as needed for nausea. 01/07/22 01/07/23 Yes [provider]  OXYGEN Inhale into the lungs. 2 liters as needed   Yes [provider]  potassium chloride SA (KLOR-CON) 20 MEQ tablet Take 1 tablet (20 mEq total) by mouth daily. 01/12/21 01/14/22 Yes [name],  Peter M, MD  RHOPRESSA 0.02 % SOLN Place 1 drop into the left eye at bedtime. 10/28/19  Yes [provider]    Inpatient Medications: Scheduled Meds:  apixaban  2.5 mg Oral BID   brimonidine  1 drop Both Eyes BID   escitalopram  10 mg Oral Daily   fluorometholone  1 drop Left Eye QID   gabapentin  100 mg Oral Daily   latanoprost  1 drop Both Eyes BID   levothyroxine  112 mcg Oral Q0600   metoprolol tartrate  25 mg Oral BID   sodium chloride flush  3 mL Intravenous Q12H   Continuous Infusions:  sodium chloride     PRN Meds: sodium chloride, acetaminophen, Muscle Rub, ondansetron (ZOFRAN) IV, sodium chloride flush  Allergies:    Allergies  Allergen Reactions   Amlodipine Nausea Only        Benicar [Olmesartan Medoxomil] Nausea Only    Social History:  Social History   Socioeconomic History   Marital status: Widowed    Spouse name: Not on file   Number of children: 4   Years of education: Not on file   Highest education level: Not on file  Occupational History   Not on file  Tobacco Use   Smoking status: Never   Smokeless tobacco: Never  Substance and Sexual Activity   Alcohol use: No   Drug use: No   Sexual activity: Never  Other Topics Concern   Not on file  Social History Narrative   Not on file   Social Determinants of Health   Financial Resource Strain: Not on file  Food Insecurity: Not on file  Transportation Needs: Not on file  Physical Activity: Not on file  Stress: Not on file  Social Connections: Not on file  Intimate Partner Violence: Not on file    Family History:    Family History  Problem Relation Age of Onset   Heart disease Mother    Liver cancer Father    Heart disease Sister    Heart disease Brother    Stroke Sister      ROS:  Please see the history of present illness.   All other ROS reviewed and negative.     Physical Exam/Data:   Vitals:   01/15/22 0528 01/15/22 0750 01/15/22 1225 01/15/22 1305  BP: (!)  83/57 (!) 92/52 (!) 80/45 (!) 85/52  Pulse: 60  67   Resp: 17 20 19 18   Temp: 98.2 F (36.8 C) 97.9 F (36.6 C) 97.9 F (36.6 C)   TempSrc: Oral Oral Oral   SpO2: 94% 94% 92% 94%  Weight:      Height:        Intake/Output Summary (Last 24 hours) at 01/15/2022 1544 Last data filed at 01/15/2022 0754 Gross per 24 hour  Intake 100 ml  Output 150 ml  Net -50 ml   Last 3 Weights 01/15/2022 01/14/2022 01/12/2022  Weight (lbs) 132 lb 4.4 oz 132 lb 4.4 oz 131 lb 13.4 oz  Weight (kg) 60 kg 60 kg 59.8 kg     Body mass index is 22.01 kg/m.  General:  Frail, no acute distress HEENT: normal Neck: JVD present to midneck  Vascular: No carotid bruits; Distal pulses 2+ bilaterally Cardiac:  normal S1, S2; irregular rhythm, grade 2/6 systolic murmur heart at left sternal border Lungs:  scattered rales in the bilateral lung bases  Abd: soft, nontender, no hepatomegaly  Ext: 2+ pitting edema bilaterally  Musculoskeletal:  No deformities Skin: warm and dry  Neuro:  CNs 2-12 intact, no focal abnormalities noted Psych:  Normal affect   EKG:  The EKG was personally reviewed and demonstrates:  afib, ventricular paced complexes, rate 68  Telemetry:  Telemetry was personally reviewed and demonstrates:  atrial fibrillation, ventricular paced complexes, rate in 50s-60s   Relevant CV Studies:  Echo 01/14/22  1. Left ventricular ejection fraction, by estimation, is 60 to 65%. The  left ventricle has normal function. The left ventricle has no regional  wall motion abnormalities. There is mild left ventricular hypertrophy.  Left ventricular diastolic parameters  are indeterminate.   2. Significant TR cannot r/o that pacing lead is tenting one of the  tricuspid leaflets Image 18 The RV lead is a medtronic MRI safe 5076 and  appears very taut with little slack . Right ventricular systolic function  is mildly reduced. The right  ventricular size is mildly enlarged.   3. Left  atrial size was mildly  dilated.   4. Right atrial size was severely dilated.   5. The pericardial effusion is posterior to the left ventricle.   6. The mitral valve is abnormal. Trivial mitral valve regurgitation. No  evidence of mitral stenosis.   7. Tricuspid valve regurgitation is moderate. Severe tricuspid stenosis.   8. The aortic valve is tricuspid. There is mild calcification of the  aortic valve. Aortic valve regurgitation is not visualized. Aortic valve  sclerosis is present, with no evidence of aortic valve stenosis.   9. The inferior vena cava is dilated in size with <50% respiratory  variability, suggesting right atrial pressure of 15 mmHg.  Laboratory Data:  High Sensitivity Troponin:  No results for input(s): TROPONINIHS in the last 720 hours.   Chemistry Recent Labs  Lab 01/11/22 0419 01/12/22 0552 01/13/22 1939 01/14/22 1010 01/15/22 0252  NA 132* 131* 127* 130* 132*  K 3.7 4.1 4.6 3.9 3.8  CL 103 101 95* 98 98  CO2 24 22 22 23 26   GLUCOSE 90 103* 122* 129* 127*  BUN 23 25* 32* 31* 34*  CREATININE 1.62* 1.54* 1.61* 1.72* 1.55*  CALCIUM 7.9* 8.6* 9.1 8.8* 8.5*  MG 2.0 2.2  --   --   --   GFRNONAA 31* 33* 31* 29* 32*  ANIONGAP 5 8 10 9 8     Recent Labs  Lab 01/08/22 2002 01/09/22 1824 01/10/22 0013 01/10/22 1159 01/11/22 0419 01/13/22 1939  PROT 6.9  --   --   --  5.4* 7.3  ALBUMIN 3.5 3.2*   < > 2.6* 2.7* 3.5  AST 340*  --   --   --  242* 281*  ALT 174*  --   --   --  159* 232*  ALKPHOS 156*  --   --   --  111 178*  BILITOT 2.0* 1.4*  --   --  1.1 1.6*   < > = values in this interval not displayed.   Lipids No results for input(s): CHOL, TRIG, HDL, LABVLDL, LDLCALC, CHOLHDL in the last 168 hours.  Hematology Recent Labs  Lab 01/10/22 0013 01/11/22 0419 01/13/22 1939  WBC 5.4 4.3 6.7  RBC 3.53* 3.76* 4.57  HGB 10.0* 10.4* 12.9  HCT 31.1* 33.9* 39.6  MCV 88.1 90.2 86.7  MCH 28.3 27.7 28.2  MCHC 32.2 30.7 32.6  RDW 20.0* 20.6* 21.2*  PLT 96* 102* 154   Thyroid  No results for input(s): TSH, FREET4 in the last 168 hours.  BNP Recent Labs  Lab 01/08/22 2002 01/13/22 1939  BNP 942.1* 767.6*    DDimer  Recent Labs  Lab 01/13/22 1939  DDIMER 2.35*     Radiology/Studies:  DG Chest Port 1 View  Result Date: 01/13/2022 CLINICAL DATA:  Weakness EXAM: PORTABLE CHEST 1 VIEW COMPARISON:  06/04/2021 FINDINGS: Left pacer remains in place, unchanged. Cardiomegaly. Vascular congestion diffuse interstitial opacities compatible with interstitial edema. Bibasilar atelectasis. No visible significant effusions. No acute bony abnormality. IMPRESSION: Cardiomegaly with vascular congestion and mild interstitial edema. Bibasilar atelectasis. Electronically Signed   By: Rolm Baptise M.D.   On: 01/13/2022 20:24   ECHOCARDIOGRAM COMPLETE  Result Date: 01/14/2022    ECHOCARDIOGRAM REPORT   Patient Name:   Veronica Young Date of Exam: 01/14/2022 Medical Rec #:  [id number]      Height:       65.0 in Accession #:    [id number]     Weight:  132.3 lb Date of Birth:  1933-12-19     BSA:          1.659 m Patient Age:    56 years       BP:           90/52 mmHg Patient Gender: F              HR:           66 bpm. Exam Location:  Inpatient Procedure: 2D Echo, Cardiac Doppler and Color Doppler Indications:    CHF  History:        Patient has prior history of Echocardiogram examinations, most                 recent 04/03/2020. CAD, Pacemaker, Signs/Symptoms:Chest Pain;                 Risk Factors:Hypertension.  Sonographer:    Glo Herring Referring Phys: [place]  1. Left ventricular ejection fraction, by estimation, is 60 to 65%. The left ventricle has normal function. The left ventricle has no regional wall motion abnormalities. There is mild left ventricular hypertrophy. Left ventricular diastolic parameters are indeterminate.  2. Significant TR cannot r/o that pacing lead is tenting one of the tricuspid leaflets Image 18 The RV lead is a medtronic MRI safe 5076  and appears very taut with little slack . Right ventricular systolic function is mildly reduced. The right ventricular size is mildly enlarged.  3. Left atrial size was mildly dilated.  4. Right atrial size was severely dilated.  5. The pericardial effusion is posterior to the left ventricle.  6. The mitral valve is abnormal. Trivial mitral valve regurgitation. No evidence of mitral stenosis.  7. Tricuspid valve regurgitation is moderate. Severe tricuspid stenosis.  8. The aortic valve is tricuspid. There is mild calcification of the aortic valve. Aortic valve regurgitation is not visualized. Aortic valve sclerosis is present, with no evidence of aortic valve stenosis.  9. The inferior vena cava is dilated in size with <50% respiratory variability, suggesting right atrial pressure of 15 mmHg. FINDINGS  Left Ventricle: Left ventricular ejection fraction, by estimation, is 60 to 65%. The left ventricle has normal function. The left ventricle has no regional wall motion abnormalities. The left ventricular internal cavity size was normal in size. There is  mild left ventricular hypertrophy. Left ventricular diastolic parameters are indeterminate. Right Ventricle: Significant TR cannot r/o that pacing lead is tenting one of the tricuspid leaflets Image 18 The RV lead is a medtronic MRI safe 5076 and appears very taut with little slack. The right ventricular size is mildly enlarged. No increase in right ventricular wall thickness. Right ventricular systolic function is mildly reduced. Left Atrium: Left atrial size was mildly dilated. Right Atrium: Right atrial size was severely dilated. Pericardium: Trivial pericardial effusion is present. The pericardial effusion is posterior to the left ventricle. Mitral Valve: The mitral valve is abnormal. There is moderate thickening of the mitral valve leaflet(s). Trivial mitral valve regurgitation. No evidence of mitral valve stenosis. Tricuspid Valve: The tricuspid valve is normal  in structure. Tricuspid valve regurgitation is moderate . Severe tricuspid stenosis. Aortic Valve: The aortic valve is tricuspid. There is mild calcification of the aortic valve. Aortic valve regurgitation is not visualized. Aortic valve sclerosis is present, with no evidence of aortic valve stenosis. Aortic valve mean gradient measures 2.0 mmHg. Aortic valve peak gradient measures 3.0 mmHg. Aortic valve area, by VTI measures 1.89 cm. Pulmonic  Valve: The pulmonic valve was normal in structure. Pulmonic valve regurgitation is trivial. No evidence of pulmonic stenosis. Aorta: The aortic root is normal in size and structure. Venous: The inferior vena cava is dilated in size with less than 50% respiratory variability, suggesting right atrial pressure of 15 mmHg. IAS/Shunts: No atrial level shunt detected by color flow Doppler. Additional Comments: A device lead is visualized.  LEFT VENTRICLE PLAX 2D LVIDd:         3.10 cm LVIDs:         2.10 cm LV PW:         1.10 cm LV IVS:        1.30 cm LVOT diam:     1.90 cm LV SV:         38 LV SV Index:   23 LVOT Area:     2.84 cm  RIGHT VENTRICLE            IVC RV Basal diam:  4.00 cm    IVC diam: 3.00 cm RV Mid diam:    3.00 cm RV S prime:     6.83 cm/s LEFT ATRIUM           Index        RIGHT ATRIUM           Index LA diam:      3.80 cm 2.29 cm/m   RA Area:     21.70 cm LA Vol (A4C): 51.8 ml 31.22 ml/m  RA Volume:   60.50 ml  36.46 ml/m  AORTIC VALVE                    PULMONIC VALVE AV Area (Vmax):    1.91 cm     PV Vmax:       0.64 m/s AV Area (Vmean):   1.81 cm     PV Peak grad:  1.6 mmHg AV Area (VTI):     1.89 cm AV Vmax:           87.07 cm/s AV Vmean:          63.500 cm/s AV VTI:            0.204 m AV Peak Grad:      3.0 mmHg AV Mean Grad:      2.0 mmHg LVOT Vmax:         58.50 cm/s LVOT Vmean:        40.433 cm/s LVOT VTI:          0.136 m LVOT/AV VTI ratio: 0.67  AORTA Ao Root diam: 2.90 cm Ao Asc diam:  3.10 cm TRICUSPID VALVE TR Peak grad:   96.4 mmHg TR Vmax:         491.00 cm/s  SHUNTS Systemic VTI:  0.14 m Systemic Diam: 1.90 cm Jenkins Rouge MD Electronically signed by Jenkins Rouge MD Signature Date/Time: 01/14/2022/1:37:06 PM    Final      Assessment and Plan:   Tricuspid regurgitation, Right heart failure   - Echo on 01/14/22 showed severe TR (could not rule out that pacing lead is tenting one of the tricuspid leaflets), severe pulmonary hypertension  - Diurese with lasix 40 mg IV once. Continue to monitor BP, reduced dose of metoprolol for BP room  - BMET in morning - Assess as an outpatient (PYP scan) for amyloidosis as patient's echo showed signs of a restrictive disease  - Ordered myeloma panel to assess for possible myeloid amyloidosis  - Consider  right heart cath, however unlikely due to patient's age, frailty, kidney function   Persistent Afib: CHADS-Vasc 6  - Rate is well controlled per EKG, telemetry showed HR in the 50s-60s - Decrease metoprolol to 12.5 mg BID to allow BP room for diuresis  - Continue eliquis 2.5 mg daily (reduced dose due to age, weight)      Risk Assessment/Risk Scores:    New York Heart Association (NYHA) Functional Class NYHA Class III  CHA2DS2-VASc Score = 6  :1} This indicates a 9.7% annual risk of stroke. The patient's score is based upon: CHF History: 1 HTN History: 1 Diabetes History: 1 Stroke History: 0 Vascular Disease History: 0 Age Score: 2 Gender Score: 1        For questions or updates, please contact [place] Please consult [email or web address] for contact info under    Signed, Margie Billet, PA-C  01/15/2022 3:44 PM   I have seen and examined the patient along with Margie Billet, PA-C .  I have reviewed the chart, notes and new data.  I agree with PA/NP's note.  Key new complaints: she was dyspneic and had significant lower extremity edema, but she is no longer dyspneic and she has markedly improved edema after diuretics. Key examination changes: JVD to angle of jaw  and no additional hepatojugular reflux, holosystolic murmur at LLSB, no apical murmurs, no diastolic murmurs, irregular rhythm, clear lungs, 1+ symmetrical edema of the ankles and pretibial area Key new findings / data: reviewed echo. There is no tricuspid stenosis. Strongly suspect this is a clerical error and the report was  Meant to say "Severe tricuspid valve regurgitation". The dominant abnormality is severe pulmonary artery hypertension (approaching systemic level, systolic PA pressure around 100 mm Hg). There is ventricular septum systolic flattening (severe PAH) and diastolic flattening (severe TR). While the pacemaker lead may be contributing to TR, the real driver is the severe PAH. This is a chronic problem that has been steadily worsening at least over the last 7 years. Echo studies performed years ago when she was in sinus rhythm did show evidence of diastolic dysfunction and elevated mean left atrial pressure. There is severe biatrial dilation. There is relatively low QRS voltage (on non-paced beats).  PLAN: She is very well rate controlled, will try to reduce the dose of metoprolol. This may allow [place] to give a little extra diuretic without causing hypotension. She is close to her euvolemic state (her weight has been roughly 128 lb most of last year, now 132 lb). She has severe PAH without known chronic lung disease or OSA and she is fully anticoagulated for years (making CTEPH less likely). 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 Recommend outpatient cardiac [id number] scan after DC.  MSanda Klein MD, [phone]/24/2023, 5:30 PM

## 2022-01-15 NOTE — Progress Notes (Signed)
Heart Failure Navigator Progress Note  Assessed for Heart & Vascular TOC clinic readiness.  Patient does not meet criteria due to severe tricuspid stenosis.   Navigator available for reassessment of patient.   Pricilla Holm, MSN, RN Heart Failure Nurse Navigator [phone]

## 2022-01-16 DIAGNOSIS — J9611 Chronic respiratory failure with hypoxia: Secondary | ICD-10-CM

## 2022-01-16 DIAGNOSIS — I5033 Acute on chronic diastolic (congestive) heart failure: Secondary | ICD-10-CM | POA: Diagnosis not present

## 2022-01-16 DIAGNOSIS — E871 Hypo-osmolality and hyponatremia: Secondary | ICD-10-CM

## 2022-01-16 LAB — BASIC METABOLIC PANEL
Anion gap: 8 (ref 5–15)
BUN: 35 mg/dL — ABNORMAL HIGH (ref 8–23)
CO2: 25 mmol/L (ref 22–32)
Calcium: 8.7 mg/dL — ABNORMAL LOW (ref 8.9–10.3)
Chloride: 95 mmol/L — ABNORMAL LOW (ref 98–111)
Creatinine, Ser: 1.53 mg/dL — ABNORMAL HIGH (ref 0.44–1.00)
GFR, Estimated: 33 mL/min — ABNORMAL LOW (ref >60)
Glucose, Bld: 102 mg/dL — ABNORMAL HIGH (ref 70–99)
Potassium: 3.7 mmol/L (ref 3.5–5.1)
Sodium: 128 mmol/L — ABNORMAL LOW (ref 135–145)

## 2022-01-16 MED ORDER — FUROSEMIDE 10 MG/ML IJ SOLN
40.0000 mg | Freq: Once | INTRAMUSCULAR | Status: AC
  Administered 2022-01-16: 13:00:00 40 mg via INTRAVENOUS
  Filled 2022-01-16: qty 4

## 2022-01-16 NOTE — Progress Notes (Signed)
Progress Note  Patient Name: Veronica Young Date of Encounter: 01/16/2022  [facility] Cardiologist: Peter [name], MD   Subjective   No acute overnight events. She states breathing is at baseline this morning. Currently on 4L of O2 (only on as needed at home but daughter states she has need it more frequently leading up to recent admission). No chest pain.  Inpatient Medications    Scheduled Meds:  apixaban  2.5 mg Oral BID   brimonidine  1 drop Both Eyes BID   escitalopram  10 mg Oral Daily   fluorometholone  1 drop Left Eye QID   gabapentin  100 mg Oral Daily   latanoprost  1 drop Both Eyes BID   levothyroxine  112 mcg Oral Q0600   metoprolol tartrate  12.5 mg Oral BID   sodium chloride flush  3 mL Intravenous Q12H   Continuous Infusions:  sodium chloride     PRN Meds: sodium chloride, acetaminophen, Muscle Rub, ondansetron (ZOFRAN) IV, sodium chloride flush   Vital Signs    Vitals:   01/16/22 0024 01/16/22 0500 01/16/22 0545 01/16/22 0700  BP: 105/60  101/64 110/64  Pulse: 69  74 76  Resp: 18  19   Temp: 97.9 F (36.6 C)  97.8 F (36.6 C)   TempSrc: Axillary  Oral   SpO2: 92%  (!) 89% (!) 89%  Weight:  61 kg    Height:       No intake or output data in the 24 hours ending 01/16/22 0959 Last 3 Weights 01/16/2022 01/15/2022 01/14/2022  Weight (lbs) 134 lb 7.7 oz 132 lb 4.4 oz 132 lb 4.4 oz  Weight (kg) 61 kg 60 kg 60 kg      Telemetry    Atrial fibrillation with intermittent ventricular pacing. Rates in the 69s. - Personally Reviewed  ECG   No new ECG tracing today. - Personally Reviewed  Physical Exam   GEN: No acute distress.   Neck: JVD elevated. Cardiac: Irregular rhythm with normal rate. III/VI systolic murmur. No rubs or gallops. Respiratory: Clear to auscultation bilaterally. No wheezes, rhonchi, or rales. GI: Soft, non-distended, and non-tender. MS: 1+ pitting edema of bilateral lower extremities. Skin: Warm and dry. Neuro:  No focal  deficits. Psych: Normal affect.  Labs    High Sensitivity Troponin:  No results for input(s): TROPONINIHS in the last 720 hours.   Chemistry Recent Labs  Lab 01/09/22 1824 01/10/22 0013 01/10/22 1159 01/11/22 0419 01/12/22 0552 01/13/22 1939 01/14/22 1010 01/15/22 0252 01/16/22 0348  NA 132*   < > 133* 132* 131* 127* 130* 132* 128*  K 3.4*   < > 3.1* 3.7 4.1 4.6 3.9 3.8 3.7  CL 98   < > 100 103 101 95* 98 98 95*  CO2 25   < > 25 24 22 22 23 26 25   GLUCOSE 125*   < > 117* 90 103* 122* 129* 127* 102*  BUN 30*   < > 26* 23 25* 32* 31* 34* 35*  CREATININE 1.85*   < > 1.72* 1.62* 1.54* 1.61* 1.72* 1.55* 1.53*  CALCIUM 8.6*   < > 8.1* 7.9* 8.6* 9.1 8.8* 8.5* 8.7*  MG  --   --   --  2.0 2.2  --   --   --   --   PROT  --   --   --  5.4*  --  7.3  --   --   --   ALBUMIN 3.2*   < >  2.6* 2.7*  --  3.5  --   --   --   AST  --   --   --  242*  --  281*  --   --   --   ALT  --   --   --  159*  --  232*  --   --   --   ALKPHOS  --   --   --  111  --  178*  --   --   --   BILITOT 1.4*  --   --  1.1  --  1.6*  --   --   --   GFRNONAA 26*   < > 29* 31* 33* 31* 29* 32* 33*  ANIONGAP 9   < > 8 5 8 10 9 8 8    < > = values in this interval not displayed.    Lipids No results for input(s): CHOL, TRIG, HDL, LABVLDL, LDLCALC, CHOLHDL in the last 168 hours.  Hematology Recent Labs  Lab 01/10/22 0013 01/11/22 0419 01/13/22 1939  WBC 5.4 4.3 6.7  RBC 3.53* 3.76* 4.57  HGB 10.0* 10.4* 12.9  HCT 31.1* 33.9* 39.6  MCV 88.1 90.2 86.7  MCH 28.3 27.7 28.2  MCHC 32.2 30.7 32.6  RDW 20.0* 20.6* 21.2*  PLT 96* 102* 154   Thyroid No results for input(s): TSH, FREET4 in the last 168 hours.  BNP Recent Labs  Lab 01/13/22 1939  BNP 767.6*    DDimer  Recent Labs  Lab 01/13/22 1939  DDIMER 2.35*     Radiology    ECHOCARDIOGRAM COMPLETE  Result Date: 01/14/2022    ECHOCARDIOGRAM REPORT   Patient Name:   Veronica Young Date of Exam: 01/14/2022 Medical Rec #:  [id number]      Height:        65.0 in Accession #:    [id number]     Weight:       132.3 lb Date of Birth:  Jan 07, 1933     BSA:          1.659 m Patient Age:    27 years       BP:           90/52 mmHg Patient Gender: F              HR:           66 bpm. Exam Location:  Inpatient Procedure: 2D Echo, Cardiac Doppler and Color Doppler Indications:    CHF  History:        Patient has prior history of Echocardiogram examinations, most                 recent 04/03/2020. CAD, Pacemaker, Signs/Symptoms:Chest Pain;                 Risk Factors:Hypertension.  Sonographer:    Glo Herring Referring Phys: [place]  1. Left ventricular ejection fraction, by estimation, is 60 to 65%. The left ventricle has normal function. The left ventricle has no regional wall motion abnormalities. There is mild left ventricular hypertrophy. Left ventricular diastolic parameters are indeterminate.  2. Significant TR cannot r/o that pacing lead is tenting one of the tricuspid leaflets Image 18 The RV lead is a medtronic MRI safe 5076 and appears very taut with little slack . Right ventricular systolic function is mildly reduced. The right ventricular size is mildly enlarged.  3. Left atrial size was mildly dilated.  4. Right  atrial size was severely dilated.  5. The pericardial effusion is posterior to the left ventricle.  6. The mitral valve is abnormal. Trivial mitral valve regurgitation. No evidence of mitral stenosis.  7. Tricuspid valve regurgitation is moderate. Severe tricuspid stenosis.  8. The aortic valve is tricuspid. There is mild calcification of the aortic valve. Aortic valve regurgitation is not visualized. Aortic valve sclerosis is present, with no evidence of aortic valve stenosis.  9. The inferior vena cava is dilated in size with <50% respiratory variability, suggesting right atrial pressure of 15 mmHg. FINDINGS  Left Ventricle: Left ventricular ejection fraction, by estimation, is 60 to 65%. The left ventricle has normal function. The  left ventricle has no regional wall motion abnormalities. The left ventricular internal cavity size was normal in size. There is  mild left ventricular hypertrophy. Left ventricular diastolic parameters are indeterminate. Right Ventricle: Significant TR cannot r/o that pacing lead is tenting one of the tricuspid leaflets Image 18 The RV lead is a medtronic MRI safe 5076 and appears very taut with little slack. The right ventricular size is mildly enlarged. No increase in right ventricular wall thickness. Right ventricular systolic function is mildly reduced. Left Atrium: Left atrial size was mildly dilated. Right Atrium: Right atrial size was severely dilated. Pericardium: Trivial pericardial effusion is present. The pericardial effusion is posterior to the left ventricle. Mitral Valve: The mitral valve is abnormal. There is moderate thickening of the mitral valve leaflet(s). Trivial mitral valve regurgitation. No evidence of mitral valve stenosis. Tricuspid Valve: The tricuspid valve is normal in structure. Tricuspid valve regurgitation is moderate . Severe tricuspid stenosis. Aortic Valve: The aortic valve is tricuspid. There is mild calcification of the aortic valve. Aortic valve regurgitation is not visualized. Aortic valve sclerosis is present, with no evidence of aortic valve stenosis. Aortic valve mean gradient measures 2.0 mmHg. Aortic valve peak gradient measures 3.0 mmHg. Aortic valve area, by VTI measures 1.89 cm. Pulmonic Valve: The pulmonic valve was normal in structure. Pulmonic valve regurgitation is trivial. No evidence of pulmonic stenosis. Aorta: The aortic root is normal in size and structure. Venous: The inferior vena cava is dilated in size with less than 50% respiratory variability, suggesting right atrial pressure of 15 mmHg. IAS/Shunts: No atrial level shunt detected by color flow Doppler. Additional Comments: A device lead is visualized.  LEFT VENTRICLE PLAX 2D LVIDd:         3.10 cm LVIDs:          2.10 cm LV PW:         1.10 cm LV IVS:        1.30 cm LVOT diam:     1.90 cm LV SV:         38 LV SV Index:   23 LVOT Area:     2.84 cm  RIGHT VENTRICLE            IVC RV Basal diam:  4.00 cm    IVC diam: 3.00 cm RV Mid diam:    3.00 cm RV S prime:     6.83 cm/s LEFT ATRIUM           Index        RIGHT ATRIUM           Index LA diam:      3.80 cm 2.29 cm/m   RA Area:     21.70 cm LA Vol (A4C): 51.8 ml 31.22 ml/m  RA Volume:   60.50 ml  36.46 ml/m  AORTIC VALVE                    PULMONIC VALVE AV Area (Vmax):    1.91 cm     PV Vmax:       0.64 m/s AV Area (Vmean):   1.81 cm     PV Peak grad:  1.6 mmHg AV Area (VTI):     1.89 cm AV Vmax:           87.07 cm/s AV Vmean:          63.500 cm/s AV VTI:            0.204 m AV Peak Grad:      3.0 mmHg AV Mean Grad:      2.0 mmHg LVOT Vmax:         58.50 cm/s LVOT Vmean:        40.433 cm/s LVOT VTI:          0.136 m LVOT/AV VTI ratio: 0.67  AORTA Ao Root diam: 2.90 cm Ao Asc diam:  3.10 cm TRICUSPID VALVE TR Peak grad:   96.4 mmHg TR Vmax:        491.00 cm/s  SHUNTS Systemic VTI:  0.14 m Systemic Diam: 1.90 cm Jenkins Rouge MD Electronically signed by Jenkins Rouge MD Signature Date/Time: 01/14/2022/1:37:06 PM    Final     Cardiac Studies   Echocardiogram 01/14/2022: Impressions:  1. Left ventricular ejection fraction, by estimation, is 60 to 65%. The  left ventricle has normal function. The left ventricle has no regional  wall motion abnormalities. There is mild left ventricular hypertrophy.  Left ventricular diastolic parameters  are indeterminate.   2. Significant TR cannot r/o that pacing lead is tenting one of the  tricuspid leaflets Image 18 The RV lead is a medtronic MRI safe 5076 and  appears very taut with little slack . Right ventricular systolic function  is mildly reduced. The right  ventricular size is mildly enlarged.   3. Left atrial size was mildly dilated.   4. Right atrial size was severely dilated.   5. The pericardial effusion  is posterior to the left ventricle.   6. The mitral valve is abnormal. Trivial mitral valve regurgitation. No  evidence of mitral stenosis.   7. Tricuspid valve regurgitation is moderate. Severe tricuspid stenosis.   8. The aortic valve is tricuspid. There is mild calcification of the  aortic valve. Aortic valve regurgitation is not visualized. Aortic valve  sclerosis is present, with no evidence of aortic valve stenosis.   9. The inferior vena cava is dilated in size with <50% respiratory  variability, suggesting right atrial pressure of 15 mmHg.   Patient Profile     88 y.o. female with a history of non-obstructive CAD on cardiac cath in 2005, atrial fibrillation on Eliquis, chronic diastolic CHF, sick sinus syndrome s/p Medtronic PPM in 2015, hypertension, hyperlipidemia, hypothyroidism, CKD stage III, and congenital deafness who is being seen for further evaluation of CHF at the request of Dr. Wynelle Cleveland.  Assessment & Plan    Chronic Diastolic CHF Right Sided CHF Severe Pulmonary Artery Hypertension  BNP elevated at 767 on admission (down from 942 on 1/17). Chest x-ray showed cardiomegaly with vascular congestion and mild interstitial edema. Echo showed LVEF of 60-65%, normal wall motion, mild LVH, mildly enlarged RV with mildly reduced RV systolic function, severe TR, mildly dilated left atrium and severely dilated right atrium.Also showed severe pulmonary artery hypertensin  with systolic PASP around 332 mmHg. She has been diuresed with IV Lasix but this has been limited by soft BP. No documented output from yesterday. Weight up 2 lbs from yesterday. Renal function stable. - She has 1+ pitting edema of lower extremities on exam but lungs are clear. Currently on 4L of O2 right now (on as needed O2 at home). - Will given another dose of IV Lasix 40mg  today. - Will order compression stockings to help with lower extremity edema. - Monitor daily weights, I/O's, and renal function. Discussed  importance of sodium and fluid restrictions. Recommend limiting fluid intake to 1,500cc per day. - In regards to severe PAH, she has no known chronic lung disease or OSA and she is fully anticoagulated for years (making CTEPH less likely). 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 Will arrange outpatient PYP scan after discharge.  Non-Obstructive CAD Noted on cath in 2005.  - No angina. - No aspirin due to need for Eliquis. - Does not appear to be on a statin. Will check fasting lipid panel int he morning.  Paroxysmal Atrial Fibrillation SSS s/p Medtronic PPM Currently in rate controlled atrial fibrillation with intermittent ventricular pacing. Rates in the 40s. - Continue Lopressor 12.5mg  twice daily. - Continue Eliquis 2.5mg  twice daily (reduced dose due to age and renal function).  Hypertension History of hypertension but BP soft here with systolic BP in the 95J yesterday. BP stable today. - Continue low dose Lopressor and diuresis as above.  CKD Stage III Creatinine 1.53 today. Baseline around 1.5 to 1.7. - Continue to monitor with diuresis.  Otherwise, per primary team.  For questions or updates, please contact [place] Please consult [email or web address] for contact info under        Signed, Darreld Mclean, PA-C  01/16/2022, 9:59 AM

## 2022-01-16 NOTE — Progress Notes (Signed)
Mobility Specialist Progress Note    01/16/22 1439  Mobility  Bed Position Chair  Activity Ambulated with assistance in hallway  Level of Assistance Minimal assist, patient does 75% or more  Assistive Device Front wheel walker  Distance Ambulated (ft) 100 ft  Activity Response Tolerated fair  $Mobility charge 1 Mobility   Pt received sitting EOB and agreeable. Ambulated on 4LO2. Took x2 short standing rest breaks. Returned to chair with family and NT present.   [facility] Mobility Specialist  M.S. 2C and 6E: [phone] M.S. 4E: [phone]

## 2022-01-16 NOTE — Progress Notes (Addendum)
°  Progress Note   Patient: Veronica Young BDZ:[id number] DOB: Feb 11, 1933 DOA: 01/13/2022     2 DOS: the patient was seen and examined on 01/16/2022   Brief hospital course: Veronica Young was admitted to the hospital with the working diagnosis of acute heart failure decompensation.   88 yo female with past medical history of congenital deafness, CKD stage 3b, chronic diastolic heart failure and atrial fibrillation who presented with lower extremity edema. She had a recent hospitalization for hypovolemia on 01/08/22 to 01/12/22. At home patient noted lower extremity edema that prompted her to come back to the emergency room. On her initial physical examination her blood pressure was 90/53, HR 60, RR 20 and oxygen saturation 88%, her lungs had rales bilaterally, heart with positive systolic murmur, abdomen soft and positive lower extremity edema.   Patient was placed on diuretic therapy for volume overload.   Assessment and Plan * Acute on chronic diastolic CHF (congestive heart failure) ([place])- (present on admission) Patient feeling better, but continue to have signs of volume overload Continue diuresis with furosemide with close follow up on blood pressure.   Echocardiogram with preserved LV systolic function, EF 60 to 65% Reduced RV systolic function. Positive severe tricuspid stenosis    Hyponatremia CKD stage 3b  Na is down to 128, positive signs of hypervolemia Continue diuresis with furosemide  Renal function with serum cr at 1,53 with K at 3,7, continue close monitoring of renal function and electrolytes.   Cardiac murmur TR  Chronic respiratory failure with hypoxia (HCC) - has home O2 Patient using home O2 on a continuous basis.  This is not new.  Anasarca Patient with 2-3+ pitting edema of her lower extremities.  Hold on further diuresis until her cardiac status/echo can be evaluated.  Chronic anticoagulation On Eliquis for A. fib.  Cardiac pacemaker in situ- (present on  admission) Stable.  Hypothyroidism- (present on admission) Stable continue with levothyroxine   GERD (gastroesophageal reflux disease)- (present on admission) No abdominal pain, no nausea or vomiting.  Deafness Interpreter at the bedside      Subjective: patient is feeling better, but continue to have lower extremity edema, no dyspnea or chest pain  Objective BP 109/65    Pulse 70    Temp 97.6 F (36.4 C) (Oral)    Resp 18    Ht 5\' 5"  (1.651 m)    Wt 61 kg    SpO2 95%    BMI 22.38 kg/m   Neurology patient is awake and alert ENT with mild pallor Cardiovascular with heart S1 and S2 present positive systolic murmur at the right sternal border.  Positive lower extremity edema ++ pitting  Pulmonary. Mild rales bilaterally at bases. Abdomen soft and non tender  Data Reviewed:   Family Communication:  I spoke with patient's family at the bedside, we talked in detail about patient's condition, plan of care and prognosis and all questions were addressed. With the help of interpreter.   Disposition: Status is: Inpatient  Remains inpatient appropriate because: IV diuresis            Author: Tawni Millers, MD 01/16/2022 6:01 PM  For on call review [email or web address].

## 2022-01-16 NOTE — Progress Notes (Signed)
Patient examined and interviewed with Sande Rives, PA. Agree with PAs notes. The patient denies this dyspnea at rest, but continues to require oxygen 4 l/min by nasal cannula due to hypoxemia. Echocardiogram shows severe pulmonary hypertension, most likely due to severe left diastolic dysfunction. Appears to be 46 pounds above her dry weight. Creatinine is at baseline. BP better on lower beta blocker dose. Will give another do se of intravenous diuretics today. Discussed a possible diagnosis of cardiac amyloidosis and will plan an outpatient cardiac technetium 67m pyrophosphate scan . If the diagnosis is confirmed, consider treatment with tafamidis.

## 2022-01-16 NOTE — Assessment & Plan Note (Addendum)
CKD stage 3b  Patient is tolerating well diuresis with furosemide, Na today is 134, with of 3,8 and serum bicarbonate at 29. Cr is 1,49  Plan to continue diuresis today and follow up renal function in am.  At home patient on 40 mg furosemide daily.

## 2022-01-16 NOTE — Progress Notes (Incomplete)
°  Progress Note   Patient: Veronica Young KCL:[id number] DOB: [birth year] DOA: 01/13/2022     2 DOS: the patient was seen and examined on 01/16/2022   Brief hospital course: No notes on file  Assessment and Plan * Acute on chronic diastolic CHF (congestive heart failure) ([place])- (present on admission) Admit to telemetry bed.  Patient recently discharged from the hospital 1/2 days ago.  According the patient's I&Os she is about 4.5 L ahead and volume.  Patient was dehydrated on admission this week.  She did have diarrhea.  This is stopped.  Seems unlikely that 2 to 3 L of fluid would have pushed her into acute heart failure.  Daughter does not know anything about previous heart murmur.  She does have quite a loud systolic heart murmur.  Her last echo was least 2 years ago.  Wonder if she has aortic stenosis.  We will hold on further diuresis.  Check echocardiogram.    Cardiac murmur Check echo.  Clinically her murmur is consistent with AS.  Chronic respiratory failure with hypoxia (HCC) - has home O2 Patient using home O2 on a continuous basis.  This is not new.  Anasarca Patient with 2-3+ pitting edema of her lower extremities.  Hold on further diuresis until her cardiac status/echo can be evaluated.  Chronic anticoagulation On Eliquis for A. fib.  Cardiac pacemaker in situ- (present on admission) Stable.  Hypothyroidism- (present on admission) Chronic.  GERD (gastroesophageal reflux disease)- (present on admission) Stable.  Deafness Chronic.  Patient uses American sign language.  Her daughter [name] also uses sign language.  Patient's daughter is not hearing impaired.   {Tip this will not be part of the note when signed Body mass index is 22.38 kg/m. , Moderate Malnutrition,     (Optional):26781}  Subjective: ***  Objective {Vitals:26382} ***  Data Reviewed: {Tip this will not be part of the note when signed- Document your independent interpretation of telemetry  tracing, EKG, lab, Radiology test or any other diagnostic tests. Add any new diagnostic test ordered today. (Optional):26781} {Results:26384}  Family Communication: ***  Disposition: Status is: Inpatient  {Inpatient:23812}      {Tip this will not be part of the note when signed  DVT Prophylaxis  .Apixaban (eliquis) tablet 2.5 mg , Apixaban (eliquis) tablet 2.5 mg  Place ted hose       (Optional):26781}   Time spent: *** minutes  Author: Tawni Millers, MD 01/16/2022 5:20 PM  For on call review [email or web address].

## 2022-01-16 NOTE — Hospital Course (Signed)
[name] was admitted to the hospital with the working diagnosis of acute heart failure decompensation.   88 yo female with past medical history of congenital deafness, CKD stage 3b, chronic diastolic heart failure and atrial fibrillation who presented with lower extremity edema. She had a recent hospitalization for hypovolemia on 01/08/22 to 01/12/22. At home patient noted lower extremity edema that prompted her to come back to the emergency room. On her initial physical examination her blood pressure was 90/53, HR 60, RR 20 and oxygen saturation 88%, her lungs had rales bilaterally, heart with positive systolic murmur, abdomen soft and positive lower extremity edema.   Patient was placed on diuretic therapy for volume overload.

## 2022-01-17 ENCOUNTER — Other Ambulatory Visit: Payer: Self-pay | Admitting: Student

## 2022-01-17 DIAGNOSIS — I5033 Acute on chronic diastolic (congestive) heart failure: Secondary | ICD-10-CM | POA: Diagnosis not present

## 2022-01-17 DIAGNOSIS — I4819 Other persistent atrial fibrillation: Secondary | ICD-10-CM

## 2022-01-17 DIAGNOSIS — E871 Hypo-osmolality and hyponatremia: Secondary | ICD-10-CM

## 2022-01-17 DIAGNOSIS — F32A Depression, unspecified: Secondary | ICD-10-CM

## 2022-01-17 DIAGNOSIS — H9190 Unspecified hearing loss, unspecified ear: Secondary | ICD-10-CM

## 2022-01-17 DIAGNOSIS — K219 Gastro-esophageal reflux disease without esophagitis: Secondary | ICD-10-CM

## 2022-01-17 DIAGNOSIS — J9621 Acute and chronic respiratory failure with hypoxia: Secondary | ICD-10-CM | POA: Diagnosis not present

## 2022-01-17 DIAGNOSIS — I5032 Chronic diastolic (congestive) heart failure: Secondary | ICD-10-CM

## 2022-01-17 DIAGNOSIS — Z950 Presence of cardiac pacemaker: Secondary | ICD-10-CM | POA: Diagnosis not present

## 2022-01-17 DIAGNOSIS — I2721 Secondary pulmonary arterial hypertension: Secondary | ICD-10-CM | POA: Diagnosis not present

## 2022-01-17 LAB — BASIC METABOLIC PANEL
Anion gap: 8 (ref 5–15)
BUN: 36 mg/dL — ABNORMAL HIGH (ref 8–23)
CO2: 29 mmol/L (ref 22–32)
Calcium: 8.9 mg/dL (ref 8.9–10.3)
Chloride: 97 mmol/L — ABNORMAL LOW (ref 98–111)
Creatinine, Ser: 1.49 mg/dL — ABNORMAL HIGH (ref 0.44–1.00)
GFR, Estimated: 34 mL/min — ABNORMAL LOW (ref >60)
Glucose, Bld: 99 mg/dL (ref 70–99)
Potassium: 3.8 mmol/L (ref 3.5–5.1)
Sodium: 134 mmol/L — ABNORMAL LOW (ref 135–145)

## 2022-01-17 MED ORDER — POTASSIUM CHLORIDE CRYS ER 20 MEQ PO TBCR
40.0000 meq | EXTENDED_RELEASE_TABLET | Freq: Once | ORAL | Status: AC
  Administered 2022-01-17: 40 meq via ORAL
  Filled 2022-01-17: qty 2

## 2022-01-17 MED ORDER — FUROSEMIDE 10 MG/ML IJ SOLN
40.0000 mg | Freq: Once | INTRAMUSCULAR | Status: AC
  Administered 2022-01-17: 40 mg via INTRAVENOUS
  Filled 2022-01-17: qty 4

## 2022-01-17 NOTE — Progress Notes (Signed)
PT Cancellation Note  Patient Details Name: Veronica Young MRN: [id number] DOB: 1933/03/05   Cancelled Treatment:    Reason Eval/Treat Not Completed: (P) Other (comment) (arranging ASL interpreter) PT will follow back once interpreter is available.  Jacquis Paxton B. Migdalia Dk PT, DPT Acute Rehabilitation Services Pager [phone] Office [phone]    [place] 01/17/2022, 9:29 AM

## 2022-01-17 NOTE — TOC Progression Note (Signed)
Transition of Care [facility]) - Progression Note    Patient Details  Name: Veronica Young MRN: [id number] Date of Birth: 04-Oct-1933  [facility]) CM/SW Contact  Graves-Bigelow, Ocie [place], RN Phone Number: 01/17/2022, 3:31 PM  Clinical Narrative: Case Manager was able to speak with patient and daughter with the interpreting services at the bedside to verify home health agency. Home Health services did not see the patient at home prior to arrival. [facility] is following the patient for PT/OT, RN. Patient lives with the daughter and plans to return home with the daughter. No durable medical equipment needs at this time. Case Manager will continue to follow for additional transition of care needs.      Expected Discharge Plan: [place] Barriers to Discharge: Continued Medical Work up  Expected Discharge Plan and Services Expected Discharge Plan: [place] In-house Referral: NA Discharge Planning Services: CM Consult Post Acute Care Choice: Home Health, Resumption of Svcs/PTA Provider Living arrangements for the past 2 months: Single Family Home                 DME Arranged: N/A DME Agency: NA       HH Arranged: RN, Disease Management, PT, OT HH Agency: [facility] Date [place]: 01/15/22 Time [place]: [place] Representative spoke with at [place]: [name]   Readmission Risk Interventions No flowsheet data found.

## 2022-01-17 NOTE — Assessment & Plan Note (Addendum)
Patient is rate controlled with metoprolol, continue anticoagulation with apixaban.

## 2022-01-17 NOTE — Evaluation (Signed)
Physical Therapy Evaluation Patient Details Name: Veronica Young MRN: [id number] DOB: 02-Jan-1933 Today's Date: 01/17/2022  History of Present Illness  88 year old female came to ER yesterday on 01/13/2002 due to worsening swelling.Admitted for Acute on chronic CHF.  PMH: congenital deafness, chronic diastolic heart failure, history of A. fib on Eliquis, history of permanent pacemaker who was discharged  01/12/2022 after hospitalization for axute hypovolemic AKI  Clinical Impression  Evaluation conducted with assistance of ASL interpreter [name] arranged through Interpreter services. Pt daughter present and often answers for patient. Pt reports that she lives with her daughter in single story home with very small step into home. Pt was only home from hospital one day where she was using RW for mobilizing. Pt is able to ambulate short distance without using RW however tires easily and requires support to return to room. Pt ambulates on 3 L O2 via [place] and is able to maintain SpO2 greater than >90% throughout. PT recommending HHPT at discharge to progress mobility.      Recommendations for follow up therapy are one component of a multi-disciplinary discharge planning process, led by the attending physician.  Recommendations may be updated based on patient status, additional functional criteria and insurance authorization.  Follow Up Recommendations Home health PT    Assistance Recommended at Discharge Frequent or constant Supervision/Assistance  Patient can return home with the following  A little help with walking and/or transfers;A little help with bathing/dressing/bathroom;Assistance with cooking/housework;Direct supervision/assist for medications management;Direct supervision/assist for financial management;Assist for transportation    Equipment Recommendations None recommended by PT     Functional Status Assessment Patient has had a recent decline in their functional status and demonstrates  the ability to make significant improvements in function in a reasonable and predictable amount of time.     Precautions / Restrictions Precautions Precautions: Fall Restrictions Weight Bearing Restrictions: No      Mobility  Bed Mobility               General bed mobility comments: OOB in recliner    Transfers Overall transfer level: Needs assistance Equipment used: None Transfers: Sit to/from Stand Sit to Stand: Supervision           General transfer comment: good power up and self steadying from recliner    Ambulation/Gait Ambulation/Gait assistance: Min guard Gait Distance (Feet): 60 Feet Assistive device: None, Rolling walker (2 wheels) Gait Pattern/deviations: Step-through pattern, Decreased stride length, Knee flexed in stance - right Gait velocity: decreased Gait velocity interpretation: <1.31 ft/sec, indicative of household ambulator   General Gait Details: slow, steady gait, with decreasing velocity and increasing effort. pt requesting RW at about 40 feet, requires increased cuing to proximity of RW        Balance Overall balance assessment: Mild deficits observed, not formally tested                                           Pertinent Vitals/Pain Pain Assessment Pain Assessment: No/denies pain    Home Living Family/patient expects to be discharged to:: Private residence Living Arrangements: Children Available Help at Discharge: Family;Available 24 hours/day Type of Home: House Home Access: Level entry       Home Layout: One level Home Equipment: Conservation officer, nature (2 wheels) Additional Comments: daughter lives with her    Prior Function Prior Level of Function : Independent/Modified Independent  Mobility Comments: reports she was using RW when she discharged from hospital ADLs Comments: reports she was independent in self care but wasn't home long enough to do much        Extremity/Trunk Assessment    Upper Extremity Assessment Upper Extremity Assessment: Overall WFL for tasks assessed    Lower Extremity Assessment Lower Extremity Assessment: RLE deficits/detail;Generalized weakness RLE Deficits / Details: hx R TKA, lacking full ext/flx, hip strength 4/5, knee ext 2/5, knee flex 2/5, ankle 4/5 RLE Sensation: WNL RLE Coordination: WNL LLE Deficits / Details: ROM WFL, strength 4+/5 LLE Sensation: WNL LLE Coordination: WNL    Cervical / Trunk Assessment Cervical / Trunk Assessment: Kyphotic  Communication   Communication: Interpreter utilized;Deaf Designer, industrial/product arranged ther Environmental manager)  Cognition Arousal/Alertness: Awake/alert Behavior During Therapy: WFL for tasks assessed/performed Overall Cognitive Status: Within Functional Limits for tasks assessed                                          General Comments General comments (skin integrity, edema, etc.): Pt on 3L O2 via [place] 90%O2 at rest, increases to 93%O2 with ambulation, HR in 80s, 2+ pitting edema        Assessment/Plan    PT Assessment Patient needs continued PT services  PT Problem List Decreased strength;Decreased activity tolerance;Decreased mobility;Cardiopulmonary status limiting activity;Decreased knowledge of use of DME       PT Treatment Interventions DME instruction;Gait training;Functional mobility training;Therapeutic activities;Therapeutic exercise;Balance training;Patient/family education    PT Goals (Current goals can be found in the Care Plan section)  Acute Rehab PT Goals Patient Stated Goal: go back home PT Goal Formulation: With patient/family Time For Goal Achievement: 01/31/22 Potential to Achieve Goals: Good    Frequency Min 3X/week        AM-PAC PT "6 Clicks" Mobility  Outcome Measure Help needed turning from your back to your side while in a flat bed without using bedrails?: A Little Help needed moving from lying on your back to sitting on  the side of a flat bed without using bedrails?: A Little Help needed moving to and from a bed to a chair (including a wheelchair)?: A Little Help needed standing up from a chair using your arms (e.g., wheelchair or bedside chair)?: A Little Help needed to walk in hospital room?: A Little Help needed climbing 3-5 steps with a railing? : A Little 6 Click Score: 18    End of Session Equipment Utilized During Treatment: Gait belt Activity Tolerance: Patient tolerated treatment well Patient left: in chair;with family/visitor present;Other (comment) (physician and interpreter present at end of session) Nurse Communication: Mobility status PT Visit Diagnosis: Other abnormalities of gait and mobility (R26.89);Muscle weakness (generalized) (M62.81)    Time: 9509-3267 PT Time Calculation (min) (ACUTE ONLY): 36 min   Charges:   PT Evaluation $PT Eval Moderate Complexity: 1 Mod PT Treatments $Gait Training: 8-22 mins        Marliyah Reid B. Migdalia Dk PT, DPT Acute Rehabilitation Services Pager [phone] Office [phone]   [place] 01/17/2022, 2:46 PM

## 2022-01-17 NOTE — Assessment & Plan Note (Signed)
Continue with nutritional supplements.  

## 2022-01-17 NOTE — Progress Notes (Addendum)
° °Progress Note ° °Patient Name: Veronica Young °Date of Encounter: 01/17/2022 ° °[facility] Cardiologist: Peter Jordan, MD  ° °Subjective  ° °No acute overnight events. Patient is doing well this morning. She denies any shortness of breath and states she feels back to her baseline but is still on 4L with O2 sats in the low 90s. No chest pain. ° °Inpatient Medications  °  °Scheduled Meds: ° apixaban  2.5 mg Oral BID  ° brimonidine  1 drop Both Eyes BID  ° escitalopram  10 mg Oral Daily  ° fluorometholone  1 drop Left Eye QID  ° gabapentin  100 mg Oral Daily  ° latanoprost  1 drop Both Eyes BID  ° levothyroxine  112 mcg Oral Q0600  ° metoprolol tartrate  12.5 mg Oral BID  ° sodium chloride flush  3 mL Intravenous Q12H  ° °Continuous Infusions: ° sodium chloride    ° °PRN Meds: °sodium chloride, acetaminophen, Muscle Rub, ondansetron (ZOFRAN) IV, sodium chloride flush  ° °Vital Signs  °  °Vitals:  ° 01/16/22 1201 01/16/22 1453 01/16/22 2048 01/17/22 0553  °BP:   125/77 109/62  °Pulse: 70  70   °Resp: 18  18 18  °Temp: 97.6 °F (36.4 °C)  97.7 °F (36.5 °C) 97.9 °F (36.6 °C)  °TempSrc: Oral  Oral Oral  °SpO2:  95% 94% 92%  °Weight:    58.7 kg  °Height:      ° ° °Intake/Output Summary (Last 24 hours) at 01/17/2022 0655 °Last data filed at 01/17/2022 0550 °Gross per 24 hour  °Intake 480 ml  °Output --  °Net 480 ml  ° °Last 3 Weights 01/17/2022 01/16/2022 01/15/2022  °Weight (lbs) 129 lb 4.8 oz 134 lb 7.7 oz 132 lb 4.4 oz  °Weight (kg) 58.65 kg 61 kg 60 kg  °   ° °Telemetry  °  °Atrial fibrillation with rates in the 60s. Intermittently ventricular paced. - Personally Reviewed ° °ECG  °  °No new ECG tracing today. - Personally Reviewed ° °Physical Exam  ° °GEN: No acute distress. Congenitally deaf. °Neck: Elevated JVD (likely secondary to severe TR). °Cardiac: Irregularly irregular rhythm with normal rate. II-III/VI systolic murmur along lower sternum border. No murmurs, rubs, or gallops.  °Respiratory: Clear to auscultation  bilaterally. No wheezes, rhonchi, or rales. °GI: Soft, non-distended, and non-tender.  °MS: Mild lower extremity edema bilaterally (improved from yesterday). No deformity. °Skin: Warm and dry. °Neuro:  No focal deficits. °Psych: Normal affect.  ° °Labs  °  °High Sensitivity Troponin:  No results for input(s): TROPONINIHS in the last 720 hours.   °Chemistry °Recent Labs  °Lab 01/10/22 °1159 01/11/22 °0419 01/12/22 °0552 01/13/22 °1939 01/14/22 °1010 01/15/22 °0252 01/16/22 °0348  °NA 133* 132* 131* 127* 130* 132* 128*  °K 3.1* 3.7 4.1 4.6 3.9 3.8 3.7  °CL 100 103 101 95* 98 98 95*  °CO2 25 24 22 22 23 26 25  °GLUCOSE 117* 90 103* 122* 129* 127* 102*  °BUN 26* 23 25* 32* 31* 34* 35*  °CREATININE 1.72* 1.62* 1.54* 1.61* 1.72* 1.55* 1.53*  °CALCIUM 8.1* 7.9* 8.6* 9.1 8.8* 8.5* 8.7*  °MG  --  2.0 2.2  --   --   --   --   °PROT  --  5.4*  --  7.3  --   --   --   °ALBUMIN 2.6* 2.7*  --  3.5  --   --   --   °AST  --    242*  --  281*  --   --   --   °ALT  --  159*  --  232*  --   --   --   °ALKPHOS  --  111  --  178*  --   --   --   °BILITOT  --  1.1  --  1.6*  --   --   --   °GFRNONAA 29* 31* 33* 31* 29* 32* 33*  °ANIONGAP 8 5 8 10 9 8 8  °  °Lipids No results for input(s): CHOL, TRIG, HDL, LABVLDL, LDLCALC, CHOLHDL in the last 168 hours.  °Hematology °Recent Labs  °Lab 01/11/22 °0419 01/13/22 °1939  °WBC 4.3 6.7  °RBC 3.76* 4.57  °HGB 10.4* 12.9  °HCT 33.9* 39.6  °MCV 90.2 86.7  °MCH 27.7 28.2  °MCHC 30.7 32.6  °RDW 20.6* 21.2*  °PLT 102* 154  ° °Thyroid No results for input(s): TSH, FREET4 in the last 168 hours.  °BNP °Recent Labs  °Lab 01/13/22 °1939  °BNP 767.6*  °  °DDimer  °Recent Labs  °Lab 01/13/22 °1939  °DDIMER 2.35*  °  ° °Radiology  °  °No results found. ° °Cardiac Studies  ° °Echocardiogram 01/14/2022: °Impressions: ° 1. Left ventricular ejection fraction, by estimation, is 60 to 65%. The  °left ventricle has normal function. The left ventricle has no regional  °wall motion abnormalities. There is mild left  ventricular hypertrophy.  °Left ventricular diastolic parameters  °are indeterminate.  ° 2. Significant TR cannot r/o that pacing lead is tenting one of the  °tricuspid leaflets Image 18 The RV lead is a medtronic MRI safe 5076 and  °appears very taut with little slack . Right ventricular systolic function  °is mildly reduced. The right  °ventricular size is mildly enlarged.  ° 3. Left atrial size was mildly dilated.  ° 4. Right atrial size was severely dilated.  ° 5. The pericardial effusion is posterior to the left ventricle.  ° 6. The mitral valve is abnormal. Trivial mitral valve regurgitation. No  °evidence of mitral stenosis.  ° 7. Tricuspid valve regurgitation is moderate. Severe tricuspid stenosis.  ° 8. The aortic valve is tricuspid. There is mild calcification of the  °aortic valve. Aortic valve regurgitation is not visualized. Aortic valve  °sclerosis is present, with no evidence of aortic valve stenosis.  ° 9. The inferior vena cava is dilated in size with <50% respiratory  °variability, suggesting right atrial pressure of 15 mmHg.  ° °Patient Profile  °   °88 y.o. female with a history of non-obstructive CAD on cardiac cath in 2005, atrial fibrillation on Eliquis, chronic diastolic CHF, sick sinus syndrome s/p Medtronic PPM in 2015, hypertension, hyperlipidemia, hypothyroidism, CKD stage III, and congenital deafness who is being seen for further evaluation of CHF at the request of Dr. Rizwan. ° °Assessment & Plan  °  °Chronic Diastolic CHF °Right Sided CHF °Severe Pulmonary Artery Hypertension  °BNP elevated at 767 on admission (down from 942 on 1/17). Chest x-ray showed cardiomegaly with vascular congestion and mild interstitial edema. Echo showed LVEF of 60-65%, normal wall motion, mild LVH, mildly enlarged RV with mildly reduced RV systolic function, severe TR, mildly dilated left atrium and severely dilated right atrium.Also showed severe pulmonary artery hypertensin with systolic PASP around 100  mmHg. She has been diuresed with IV Lasix but this has been limited by soft BP. No documented output from the past 2 days but weight down 5 lbs from   yesterday. - She states breathing is at baseline but still on 3L of O2 with O2 sats in the low 90s. Mild edema on exam. - Will discuss additional dose of IV Lasix for just restarting PO Lasix with MD. - Offer compression stockings to help with edema but patient declined. - Monitor daily weights, I/O's, and renal function. Discussed importance of sodium and fluid restrictions yesterday. Recommend limiting fluid intake to 1,500cc per day. - In regards to severe PAH, she has no known chronic lung disease or OSA and she is fully anticoagulated for years (making CTEPH less likely). 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 Will arrange outpatient PYP scan after discharge. Multiple myeloma panel pending.   Non-Obstructive CAD Noted on cath in 2005.  - No angina. - No aspirin due to need for Eliquis. - Does not appear to be on a statin.   Paroxysmal Atrial Fibrillation SSS s/p Medtronic PPM Currently in rate controlled atrial fibrillation with intermittent ventricular pacing. Rates in the 35s. - Continue Lopressor 12.14m twice daily. - Continue Eliquis 2.542mtwice daily (reduced dose due to age and renal function).   Hypertension BP soft at times but stable. - Continue low dose Lopressor as above.   CKD Stage III Baseline around 1.5 to 1.7. - Creatinine stable at 1.49 today. - Continue to monitor with diuresis.   Otherwise, per primary team.    For questions or updates, please contact [facility] consult [email or web address] for contact info under        Signed, CaDarreld McleanPA-C  01/17/2022, 6:55 AM    I have seen and examined the patient along with CaDarreld McleanPA-C .  I have reviewed the chart, notes and new data.  I agree with PA/NP's note.  Key new complaints: no  dyspnea at rest, but still has relatively high )2 requirement and some edema Key examination changes: ++JVD, clear lungs, mild edema Key new findings / data: creatinine stable with diuresis. Output not recorded properly, but weight down and close to estimated "dry weight" of 128 lb  PLAN: We will probably never completely get rid of the signs of R heart failure due to the severity of her TR and PAH, but would like to get her down to lower O2 requirements before DC. Another dose of diuretic this AM. Avoid hypoxia, since this will worsen PAH. DC if we can get her down to O2 3 l/min   MiSanda KleinMD, FACenter [facility] ([phone]/26/2023, 9:57 AM

## 2022-01-17 NOTE — Care Management Important Message (Signed)
Important Message  Patient Details  Name: Veronica Young MRN: [id number] Date of Birth: 02/03/1933   Medicare Important Message Given:  Yes     [name] 01/17/2022, 11:56 AM

## 2022-01-17 NOTE — Progress Notes (Addendum)
Interpreter services at bedside, reviewed medications with patient, no questions or concerns from patient. Patient's daughter is requesting for cardiology to see pt, will reach out to cardiology. Patient is sitting on recliner, call bell within reach, will continue to monitor.

## 2022-01-17 NOTE — Progress Notes (Signed)
Ordered outpatient PYP scan per Dr. Sallyanne Kuster. Please see rounding note from today for further evaluation.  Darreld Mclean, PA-C 01/17/2022 9:38 AM

## 2022-01-17 NOTE — Progress Notes (Signed)
Progress Note   Patient: Veronica Young QQI:[id number] DOB: 05/31/1933 DOA: 01/13/2022     3 DOS: the patient was seen and examined on 01/17/2022   Brief hospital course: Mrs. Marteney was admitted to the hospital with the working diagnosis of acute heart failure decompensation.   88 yo female with past medical history of congenital deafness, CKD stage 3b, chronic diastolic heart failure and atrial fibrillation who presented with lower extremity edema. She had a recent hospitalization for hypovolemia on 01/08/22 to 01/12/22. At home patient noted lower extremity edema that prompted her to come back to the emergency room. On her initial physical examination her blood pressure was 90/53, HR 60, RR 20 and oxygen saturation 88%, her lungs had rales bilaterally, heart with positive systolic murmur, abdomen soft and positive lower extremity edema.   Patient was placed on diuretic therapy for volume overload.   Assessment and Plan * Acute on chronic diastolic CHF (congestive heart failure) ([place])- (present on admission) Echocardiogram with preserved LV systolic function, EF 60 to 65% Reduced RV systolic function. Significant tricuspid regurgitation, RV size is mildly enlarged. RA severe dilatation. Tricuspid stenosis.   Patient with persistent edema, case discussed with Dr Sallyanne Kuster and plan to continue diuresis with IV furosemide.  Patient with pulmonary hypertension.    Chronic respiratory failure with hypoxia (HCC) - has home O2 Pulmonary hypertension with acute on chronic hypoxemic respiratory failure.  At home patient on 2,5 L/min per [place] of supplemental 02, Patient has been on 4 L with 02 saturation 96%, plan to decrease to 3 L per min of supplemental 02. Target 02 saturation 88% or greater.   Hyponatremia CKD stage 3b  Patient is tolerating well diuresis with furosemide, Na today is 134, with of 3,8 and serum bicarbonate at 29. Cr is 1,49  Plan to continue diuresis today and follow up renal  function in am.  At home patient on 40 mg furosemide daily.   Deafness Interpreter at the bedside   Hypothyroidism- (present on admission) Stable continue with levothyroxine   GERD (gastroesophageal reflux disease)- (present on admission) No abdominal pain, no nausea or vomiting.  AF (paroxysmal atrial fibrillation) ([place])- (present on admission) Patient is rate controlled with metoprolol, continue anticoagulation with apixaban.   Malnutrition of moderate degree- (present on admission) Continue with nutritional supplements   Cardiac murmur-resolved as of 01/17/2022 TR  Chronic anticoagulation-resolved as of 01/17/2022 On Eliquis for A. fib.  Cardiac pacemaker in situ-resolved as of 01/17/2022, (present on admission) Stable.     Subjective: Patient with no  nausea or vomiting, dyspnea continue to improve, edema at the lower extremities not back to baseline   Objective BP 109/62 (BP Location: Right Arm)    Pulse 70    Temp 97.9 F (36.6 C) (Oral)    Resp 18    Ht 5\' 5"  (1.651 m)    Wt 58.7 kg    SpO2 92%    BMI 21.52 kg/m   Neurology awake and alert ENT mild pallor Cardiovascular positive systolic murmur 3/6 at the right sternal border, S1 and S2 present and rhythmic. Positive JVD Positive lower extremity edema, ++  Pulmonary positive rales bilaterally more at bases Abdominal soft and non tender Skin no rashes  Musculoskeletal   Data Reviewed:    Family Communication: I spoke with patient's daughter at the bedside, we talked in detail about patient's condition, plan of care and prognosis and all questions were addressed.   Disposition: Status is: Inpatient  Remains inpatient appropriate  because: IV diuresis         Author: Tawni Millers, MD 01/17/2022 9:10 AM  For on call review [email or web address].

## 2022-01-17 NOTE — Plan of Care (Signed)

## 2022-01-17 NOTE — Progress Notes (Addendum)
Spoke to [place] [phone] at interpretation services. Discussed concerns about pt not receiving proper interpretation services. 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 Will speak to provider regarding pt care plan and will call back Jessica at interpretation services to coordinate a time to provide their services to patient.

## 2022-01-18 DIAGNOSIS — I5033 Acute on chronic diastolic (congestive) heart failure: Secondary | ICD-10-CM | POA: Diagnosis not present

## 2022-01-18 LAB — BASIC METABOLIC PANEL
Anion gap: 7 (ref 5–15)
BUN: 38 mg/dL — ABNORMAL HIGH (ref 8–23)
CO2: 29 mmol/L (ref 22–32)
Calcium: 8.8 mg/dL — ABNORMAL LOW (ref 8.9–10.3)
Chloride: 99 mmol/L (ref 98–111)
Creatinine, Ser: 1.5 mg/dL — ABNORMAL HIGH (ref 0.44–1.00)
GFR, Estimated: 34 mL/min — ABNORMAL LOW (ref >60)
Glucose, Bld: 109 mg/dL — ABNORMAL HIGH (ref 70–99)
Potassium: 4.3 mmol/L (ref 3.5–5.1)
Sodium: 135 mmol/L (ref 135–145)

## 2022-01-18 MED ORDER — METOPROLOL TARTRATE 25 MG PO TABS: 12.5000 mg | ORAL_TABLET | Freq: Two times a day (BID) | ORAL | 0 refills | Status: DC

## 2022-01-18 MED ORDER — FUROSEMIDE 40 MG PO TABS
80.0000 mg | ORAL_TABLET | Freq: Every day | ORAL | Status: DC
  Administered 2022-01-18: 80 mg via ORAL
  Filled 2022-01-18: qty 2

## 2022-01-18 MED ORDER — FUROSEMIDE 40 MG PO TABS: 80.0000 mg | ORAL_TABLET | Freq: Every day | ORAL | 0 refills | Status: DC

## 2022-01-18 NOTE — Plan of Care (Signed)

## 2022-01-18 NOTE — Discharge Instructions (Addendum)
Heart Failure Education: Weigh yourself EVERY morning after you go to the bathroom but before you eat or drink anything. Write this number down in a weight log/diary. If you gain 3 pounds overnight or 5 pounds in a week, call the office. Take your medicines as prescribed. If you have concerns about your medications, please call us before you stop taking them.  Eat low salt foods--Limit salt (sodium) to 2000 mg per day. This will help prevent your body from holding onto fluid. Read food labels as many processed foods have a lot of sodium, especially canned goods and prepackaged meats. If you would like some assistance choosing low sodium foods, we would be happy to set you up with a nutritionist. Limit all fluids for the day to less than 1.5 liters (about 2 quarts). Fluid includes all drinks, coffee, juice, ice chips, soup, jello, and all other liquids. Stay as active as you can everyday. Staying active will give you more energy and make your muscles stronger. Start with 5 minutes at a time and work your way up to 30 minutes a day. Break up your activities--do some in the morning and some in the afternoon. Start with 3 days per week and work your way up to 5 days as you can.  If you have chest pain, feel short of breath, dizzy, or lightheaded, STOP. If you don't feel better after a short rest, call 911. If you do feel better, call the office to let us know you have symptoms with exercise.

## 2022-01-18 NOTE — Discharge Summary (Signed)
Physician Discharge Summary  Veronica Young:[id number] DOB: Apr 28, 1933 DOA: 01/13/2022  PCP: Katherina Mires, MD  Admit date: 01/13/2022 Discharge date: 01/18/2022  Admitted From: home  Disposition:   Home   Recommendations for Outpatient Follow-up and new medication changes:  Follow up with Dr. Maudie Mercury in 7 to 10 days.  Continue taking furosemide 80 mg daily, take an extra 40 mg tablet in case of worsening edema or weight gain 3 lbs in 48 hours or 5 lbs in 7 days.   Home Health: yes  Equipment/Devices: home 02    Discharge Condition: stable  CODE STATUS: full   Diet recommendation:  heart healthy   Brief/Interim Summary: Veronica Young was admitted to the hospital with the working diagnosis of acute heart failure decompensation in the setting of sever tricuspid regurgitation and pulmonary hypertension.    88 yo female with past medical history of congenital deafness, CKD stage 3b, chronic diastolic heart failure and atrial fibrillation who presented with lower extremity edema. She had a recent hospitalization for hypovolemia on 01/08/22 to 01/12/22. At home patient noted lower extremity edema that prompted her to come back to the emergency room. On her initial physical examination her blood pressure was 90/53, HR 60, RR 20 and oxygen saturation 88%, her lungs had rales bilaterally, heart with positive systolic murmur, abdomen soft and positive lower extremity edema.    Na 132, K 3,1, CL 100, bicarbonate 25, glucose 117, bun 26, cr 1,62 AST 242, ALT 159 BNP 767  Wbc 4,3, hgb 10,4 hct 33,9 and plt 102 SARS COVID 19 negative  Chest radiograph with cardiomegaly with hilar vascular congestion and bilateral interstitial infiltrates.   EKG 68 bpm, with normal axis, normal intervals, atrial flutter and V paced rhythm, no significant ST segment or T wave changes,   Patient was placed on diuretic therapy for volume overload.   Acute on chronic diastolic heart failure in the setting of sever  tricuspid regurgitation and pulmonary hypertension.  Patient was admitted to the cardiac unit and placed on a telemetry monitor. She was diuresed with IV furosemide, negative fluid balance was achieved with significant improvement in her symptoms.   Further workup with echocardiogram with preserved LV systolic function, EF 60 to 65% Reduced RV systolic function. Significant tricuspid regurgitation, RV size is mildly enlarged. RA severe dilatation. Tricuspid stenosis.    Patient with sever tricuspid regurgitation and difficult to manage edema, she was pulmonary hypertension and chronic core pulmonale. Plan to increase home furosemide dose to 80 mg daily and take extra 40 mg in case of volume overload.  Will need close follow up as outpatient.   Continue metoprolol, dose has been decreased to 12,5 mg bid to prevent hypotension    2. Chronic hypoxemic respiratory failure, pulmonary hypertension.  Patient responded well to diuresis with furosemide, she was placed on supplemental 02 per [place], during her hospitalization.  At the time of her discharge her dyspnea has improved and her oxygenation is 92 to 93% on 3 L/min per [place] Will arrange for portable 02 to use at home.   3. CKD stage 3B, hyponatremia. hypokalemia her renal function remained stable with a discharge serum cr at 1,5 with K at 4,3 and serum bicarbonate at 29. NA 135  Patient is being discharge on a higher dose of furosemide, continue with K supplementation and follow renal function and electrolytes as outpatient.   4. HTN patient with right heart failure and pulmonary hypertension with severe tricuspid regurgitation and risk of  hypotension. Will hold on hydralazine and dose of metoprolol has been reduced to 12,5 bid.   5. GERD. Remained stable, patient tolerating po well.   6. Paroxysmal atrial fibrillation. Sp pacer  Rate control with metoprolol and anticoagulation with apixaban,   7. Moderate calorie protein malnutrition.   Continue with nutritional supplements.   8. Deafness. Interpreter at the bedside at the time of discharge and during her hospitalization, all her questions were addressed.   Discharge Diagnoses:  Principal Problem:   Acute on chronic diastolic CHF (congestive heart failure) (HCC) Active Problems:   Chronic respiratory failure with hypoxia (HCC) - has home O2   Hyponatremia   Deafness   Hypothyroidism   GERD (gastroesophageal reflux disease)   AF (paroxysmal atrial fibrillation) (HCC)   HTN (hypertension)   Malnutrition of moderate degree   Depression    Discharge Instructions   Allergies as of 01/18/2022       Reactions   Amlodipine Nausea Only      Benicar [olmesartan Medoxomil] Nausea Only        Medication List     STOP taking these medications    hydrALAZINE 50 MG tablet Commonly known as: APRESOLINE       TAKE these medications    acetaminophen 500 MG tablet Commonly known as: TYLENOL Take 1,000 mg by mouth every 6 (six) hours as needed for moderate pain.   apixaban 2.5 MG Tabs tablet Commonly known as: Eliquis Take 1 tablet (2.5 mg total) by mouth 2 (two) times daily.   atropine 1 % ophthalmic solution Place 1 drop into the left eye at bedtime.   brimonidine 0.2 % ophthalmic solution Commonly known as: ALPHAGAN Place 1 drop into both eyes 2 (two) times daily.   calcium-vitamin D 500-200 MG-UNIT tablet Commonly known as: OSCAL WITH D Take 1 tablet by mouth daily with breakfast.   cholecalciferol 1000 units tablet Commonly known as: VITAMIN D Take 1,000 Units by mouth daily.   escitalopram 10 MG tablet Commonly known as: LEXAPRO Take 10 mg by mouth daily.   feeding supplement Liqd Take 237 mLs by mouth 3 (three) times daily between meals.   fluorometholone 0.1 % ophthalmic suspension Commonly known as: FML Place 1 drop into the left eye 4 (four) times daily.   furosemide 40 MG tablet Commonly known as: LASIX Take 2 tablets (80 mg  total) by mouth daily. What changed: how much to take   gabapentin 100 MG capsule Commonly known as: NEURONTIN Take 100 mg by mouth at bedtime.   latanoprost 0.005 % ophthalmic solution Commonly known as: XALATAN Place 1 drop into both eyes in the morning and at bedtime.   levothyroxine 112 MCG tablet Commonly known as: SYNTHROID Take 112 mcg by mouth daily before breakfast.   metoprolol tartrate 25 MG tablet Commonly known as: LOPRESSOR Take 0.5 tablets (12.5 mg total) by mouth 2 (two) times daily. What changed: how much to take   multivitamin with minerals Tabs tablet Take 1 tablet by mouth daily.   ondansetron 4 MG tablet Commonly known as: ZOFRAN Take 4 mg by mouth daily as needed for nausea.   OXYGEN Inhale into the lungs. 2 liters as needed   potassium chloride SA 20 MEQ tablet Commonly known as: KLOR-CON M Take 1 tablet (20 mEq total) by mouth daily.   Rhopressa 0.02 % Soln Generic drug: Netarsudil Dimesylate Place 1 drop into the left eye at bedtime.        Follow-up Information  [facility].   Specialty: Home Health Services Why: Registered Nurse, Physical/Occupational Therapy-office to call with visit times. Contact information: [place] [address] [phone]         Lendon Colonel, NP Follow up.   Specialties: Nurse Practitioner, Radiology, Cardiology Why: Follow-up with Cardiology scheduled for 02/01/2022 at 2:45pm. Our office will also call you to schedule PYP scan for further evaluation of possible cardiac amyloidosis. Contact information: [address] [place] [address] [phone]                Allergies  Allergen Reactions   Amlodipine Nausea Only        Benicar [Olmesartan Medoxomil] Nausea Only    Consultations: Cardiology    Procedures/Studies: DG Chest Port 1 View  Result Date: 01/13/2022 CLINICAL DATA:  Weakness EXAM:  PORTABLE CHEST 1 VIEW COMPARISON:  06/04/2021 FINDINGS: Left pacer remains in place, unchanged. Cardiomegaly. Vascular congestion diffuse interstitial opacities compatible with interstitial edema. Bibasilar atelectasis. No visible significant effusions. No acute bony abnormality. IMPRESSION: Cardiomegaly with vascular congestion and mild interstitial edema. Bibasilar atelectasis. Electronically Signed   By: Rolm Baptise M.D.   On: 01/13/2022 20:24   DG Abdomen Acute W/Chest  Result Date: 01/08/2022 CLINICAL DATA:  Shortness of breath and vomiting. EXAM: DG ABDOMEN ACUTE WITH 1 VIEW CHEST COMPARISON:  Chest radiograph dated 06/04/2021. FINDINGS: Diffuse chronic interstitial coarsening. There are bibasilar densities which may represent atelectasis. Developing infiltrate is less likely but not excluded. Blunting of the costophrenic angles may represent trace pleural effusion. No pneumothorax. Mild cardiomegaly. Left pectoral pacemaker device. No bowel dilatation or evidence of obstruction. No free air. Surgical clips in the right flank pain degenerative changes of the spine and scoliosis. No acute osseous pathology. IMPRESSION: 1. Bibasilar atelectasis versus less likely developing infiltrate. 2. Possible trace bilateral pleural effusions. 3. No bowel obstruction. Electronically Signed   By: Anner Crete M.D.   On: 01/08/2022 20:43   ECHOCARDIOGRAM COMPLETE  Result Date: 01/14/2022    ECHOCARDIOGRAM REPORT   Patient Name:   KENLY HENCKEL Cyphers Date of Exam: 01/14/2022 Medical Rec #:  [id number]      Height:       65.0 in Accession #:    [id number]     Weight:       132.3 lb Date of Birth:  01-08-1933     BSA:          1.659 m Patient Age:    88 years       BP:           90/52 mmHg Patient Gender: F              HR:           66 bpm. Exam Location:  Inpatient Procedure: 2D Echo, Cardiac Doppler and Color Doppler Indications:    CHF  History:        Patient has prior history of Echocardiogram examinations, most                  recent 04/03/2020. CAD, Pacemaker, Signs/Symptoms:Chest Pain;                 Risk Factors:Hypertension.  Sonographer:    Glo Herring Referring Phys: [place]  1. Left ventricular ejection fraction, by estimation, is 60 to 65%. The left ventricle has normal function. The left ventricle has no regional wall motion abnormalities.  There is mild left ventricular hypertrophy. Left ventricular diastolic parameters are indeterminate.  2. Significant TR cannot r/o that pacing lead is tenting one of the tricuspid leaflets Image 18 The RV lead is a medtronic MRI safe 5076 and appears very taut with little slack . Right ventricular systolic function is mildly reduced. The right ventricular size is mildly enlarged.  3. Left atrial size was mildly dilated.  4. Right atrial size was severely dilated.  5. The pericardial effusion is posterior to the left ventricle.  6. The mitral valve is abnormal. Trivial mitral valve regurgitation. No evidence of mitral stenosis.  7. Tricuspid valve regurgitation is moderate. Severe tricuspid stenosis.  8. The aortic valve is tricuspid. There is mild calcification of the aortic valve. Aortic valve regurgitation is not visualized. Aortic valve sclerosis is present, with no evidence of aortic valve stenosis.  9. The inferior vena cava is dilated in size with <50% respiratory variability, suggesting right atrial pressure of 15 mmHg. FINDINGS  Left Ventricle: Left ventricular ejection fraction, by estimation, is 60 to 65%. The left ventricle has normal function. The left ventricle has no regional wall motion abnormalities. The left ventricular internal cavity size was normal in size. There is  mild left ventricular hypertrophy. Left ventricular diastolic parameters are indeterminate. Right Ventricle: Significant TR cannot r/o that pacing lead is tenting one of the tricuspid leaflets Image 18 The RV lead is a medtronic MRI safe 5076 and appears very taut with little  slack. The right ventricular size is mildly enlarged. No increase in right ventricular wall thickness. Right ventricular systolic function is mildly reduced. Left Atrium: Left atrial size was mildly dilated. Right Atrium: Right atrial size was severely dilated. Pericardium: Trivial pericardial effusion is present. The pericardial effusion is posterior to the left ventricle. Mitral Valve: The mitral valve is abnormal. There is moderate thickening of the mitral valve leaflet(s). Trivial mitral valve regurgitation. No evidence of mitral valve stenosis. Tricuspid Valve: The tricuspid valve is normal in structure. Tricuspid valve regurgitation is moderate . Severe tricuspid stenosis. Aortic Valve: The aortic valve is tricuspid. There is mild calcification of the aortic valve. Aortic valve regurgitation is not visualized. Aortic valve sclerosis is present, with no evidence of aortic valve stenosis. Aortic valve mean gradient measures 2.0 mmHg. Aortic valve peak gradient measures 3.0 mmHg. Aortic valve area, by VTI measures 1.89 cm. Pulmonic Valve: The pulmonic valve was normal in structure. Pulmonic valve regurgitation is trivial. No evidence of pulmonic stenosis. Aorta: The aortic root is normal in size and structure. Venous: The inferior vena cava is dilated in size with less than 50% respiratory variability, suggesting right atrial pressure of 15 mmHg. IAS/Shunts: No atrial level shunt detected by color flow Doppler. Additional Comments: A device lead is visualized.  LEFT VENTRICLE PLAX 2D LVIDd:         3.10 cm LVIDs:         2.10 cm LV PW:         1.10 cm LV IVS:        1.30 cm LVOT diam:     1.90 cm LV SV:         38 LV SV Index:   23 LVOT Area:     2.84 cm  RIGHT VENTRICLE            IVC RV Basal diam:  4.00 cm    IVC diam: 3.00 cm RV Mid diam:    3.00 cm RV S prime:  6.83 cm/s LEFT ATRIUM           Index        RIGHT ATRIUM           Index LA diam:      3.80 cm 2.29 cm/m   RA Area:     21.70 cm LA Vol  (A4C): 51.8 ml 31.22 ml/m  RA Volume:   60.50 ml  36.46 ml/m  AORTIC VALVE                    PULMONIC VALVE AV Area (Vmax):    1.91 cm     PV Vmax:       0.64 m/s AV Area (Vmean):   1.81 cm     PV Peak grad:  1.6 mmHg AV Area (VTI):     1.89 cm AV Vmax:           87.07 cm/s AV Vmean:          63.500 cm/s AV VTI:            0.204 m AV Peak Grad:      3.0 mmHg AV Mean Grad:      2.0 mmHg LVOT Vmax:         58.50 cm/s LVOT Vmean:        40.433 cm/s LVOT VTI:          0.136 m LVOT/AV VTI ratio: 0.67  AORTA Ao Root diam: 2.90 cm Ao Asc diam:  3.10 cm TRICUSPID VALVE TR Peak grad:   96.4 mmHg TR Vmax:        491.00 cm/s  SHUNTS Systemic VTI:  0.14 m Systemic Diam: 1.90 cm Jenkins Rouge MD Electronically signed by Jenkins Rouge MD Signature Date/Time: 01/14/2022/1:37:06 PM    Final    US Abdomen Limited RUQ (LIVER/GB)  Result Date: 01/08/2022 CLINICAL DATA:  Emesis EXAM: ULTRASOUND ABDOMEN LIMITED RIGHT UPPER QUADRANT COMPARISON:  None. FINDINGS: Gallbladder: Status post cholecystectomy. Common bile duct: Diameter: 3 mm. Liver: No focal lesion identified. Within normal limits in parenchymal echogenicity. Portal vein is patent on color Doppler imaging with normal direction of blood flow towards the liver. Other: None. IMPRESSION: Unremarkable right upper quadrant ultrasound in a patient status post cholecystectomy. Electronically Signed   By: Iven Finn M.D.   On: 01/08/2022 21:47      Subjective: Patient with improvement in her dyspnea and lower extremity edema, she feels comfortable going home today   Discharge Exam: Vitals:   01/17/22 2028 01/18/22 0604  BP: 105/61 127/79  Pulse: 74 63  Resp: 16 16  Temp: 97.7 F (36.5 C)   SpO2: 93% 97%   Vitals:   01/17/22 1020 01/17/22 1147 01/17/22 2028 01/18/22 0604  BP: 128/79 101/63 105/61 127/79  Pulse:  70 74 63  Resp:  16 16 16   Temp:  98.4 F (36.9 C) 97.7 F (36.5 C)   TempSrc:  Oral Oral Oral  SpO2:   93% 97%  Weight:      Height:         General: Not in pain or dyspnea  Neurology: Awake and alert, non focal  E ENT: no pallor, no icterus, oral mucosa moist Cardiovascular: Positive JVD. S1-S2 present, rhythmic, no gallops, rubs, or murmurs. + bilateral lower extremity edema. Pulmonary: positive breath sounds bilaterally, adequate air movement, no wheezing, rhonchi or rales. Gastrointestinal. Abdomen flat, no organomegaly, non tender, no rebound or guarding Skin. No rashes Musculoskeletal: no joint deformities  The results of significant diagnostics from this hospitalization (including imaging, microbiology, ancillary and laboratory) are listed below for reference.     Microbiology: Recent Results (from the past 240 hour(s))  Resp Panel by RT-PCR (Flu A&B, Covid) Nasopharyngeal Swab     Status: None   Collection Time: 01/08/22  8:09 PM   Specimen: Nasopharyngeal Swab; Nasopharyngeal(NP) swabs in vial transport medium  Result Value Ref Range Status   SARS Coronavirus 2 by RT PCR NEGATIVE NEGATIVE Final    Comment: (NOTE) SARS-CoV-2 target nucleic acids are NOT DETECTED.  The SARS-CoV-2 RNA is generally detectable in upper respiratory specimens during the acute phase of infection. The lowest concentration of SARS-CoV-2 viral copies this assay can detect is 138 copies/mL. A negative result does not preclude SARS-Cov-2 infection and should not be used as the sole basis for treatment or other patient management decisions. A negative result may occur with  improper specimen collection/handling, submission of specimen other than nasopharyngeal swab, presence of viral mutation(s) within the areas targeted by this assay, and inadequate number of viral copies(<138 copies/mL). A negative result must be combined with clinical observations, patient history, and epidemiological information. The expected result is Negative.  Fact Sheet for Patients:  EntrepreneurPulse.com.au  Fact Sheet for Healthcare  Providers:  IncredibleEmployment.be  This test is no t yet approved or cleared by the [place] FDA and  has been authorized for detection and/or diagnosis of SARS-CoV-2 by FDA under an Emergency Use Authorization (EUA). This EUA will remain  in effect (meaning this test can be used) for the duration of the COVID-19 declaration under Section 564(b)(1) of the Act, 21 U.S.C.section 360bbb-3(b)(1), unless the authorization is terminated  or revoked sooner.       Influenza A by PCR NEGATIVE NEGATIVE Final   Influenza B by PCR NEGATIVE NEGATIVE Final    Comment: (NOTE) The Xpert Xpress SARS-CoV-2/FLU/RSV plus assay is intended as an aid in the diagnosis of influenza from Nasopharyngeal swab specimens and should not be used as a sole basis for treatment. Nasal washings and aspirates are unacceptable for Xpert Xpress SARS-CoV-2/FLU/RSV testing.  Fact Sheet for Patients: EntrepreneurPulse.com.au  Fact Sheet for Healthcare Providers: IncredibleEmployment.be  This test is not yet approved or cleared by the [place] FDA and has been authorized for detection and/or diagnosis of SARS-CoV-2 by FDA under an Emergency Use Authorization (EUA). This EUA will remain in effect (meaning this test can be used) for the duration of the COVID-19 declaration under Section 564(b)(1) of the Act, 21 U.S.C. section 360bbb-3(b)(1), unless the authorization is terminated or revoked.  Performed at [facility], [address] [place], [place] [address]   Resp Panel by RT-PCR (Flu A&B, Covid) Nasopharyngeal Swab     Status: None   Collection Time: 01/13/22  7:39 PM   Specimen: Nasopharyngeal Swab; Nasopharyngeal(NP) swabs in vial transport medium  Result Value Ref Range Status   SARS Coronavirus 2 by RT PCR NEGATIVE NEGATIVE Final    Comment: (NOTE) SARS-CoV-2 target nucleic acids are NOT DETECTED.  The SARS-CoV-2 RNA is generally  detectable in upper respiratory specimens during the acute phase of infection. The lowest concentration of SARS-CoV-2 viral copies this assay can detect is 138 copies/mL. A negative result does not preclude SARS-Cov-2 infection and should not be used as the sole basis for treatment or other patient management decisions. A negative result may occur with  improper specimen collection/handling, submission of specimen other than nasopharyngeal swab, presence of viral mutation(s) within  the areas targeted by this assay, and inadequate number of viral copies(<138 copies/mL). A negative result must be combined with clinical observations, patient history, and epidemiological information. The expected result is Negative.  Fact Sheet for Patients:  EntrepreneurPulse.com.au  Fact Sheet for Healthcare Providers:  IncredibleEmployment.be  This test is no t yet approved or cleared by the [place] FDA and  has been authorized for detection and/or diagnosis of SARS-CoV-2 by FDA under an Emergency Use Authorization (EUA). This EUA will remain  in effect (meaning this test can be used) for the duration of the COVID-19 declaration under Section 564(b)(1) of the Act, 21 U.S.C.section 360bbb-3(b)(1), unless the authorization is terminated  or revoked sooner.       Influenza A by PCR NEGATIVE NEGATIVE Final   Influenza B by PCR NEGATIVE NEGATIVE Final    Comment: (NOTE) The Xpert Xpress SARS-CoV-2/FLU/RSV plus assay is intended as an aid in the diagnosis of influenza from Nasopharyngeal swab specimens and should not be used as a sole basis for treatment. Nasal washings and aspirates are unacceptable for Xpert Xpress SARS-CoV-2/FLU/RSV testing.  Fact Sheet for Patients: EntrepreneurPulse.com.au  Fact Sheet for Healthcare Providers: IncredibleEmployment.be  This test is not yet approved or cleared by the [place] FDA  and has been authorized for detection and/or diagnosis of SARS-CoV-2 by FDA under an Emergency Use Authorization (EUA). This EUA will remain in effect (meaning this test can be used) for the duration of the COVID-19 declaration under Section 564(b)(1) of the Act, 21 U.S.C. section 360bbb-3(b)(1), unless the authorization is terminated or revoked.  Performed at [facility], [address] [place], [place] [address]      Labs: BNP (last 3 results) Recent Labs    06/04/21 2123 01/08/22 2002 01/13/22 1939  BNP 414.8* 942.1* 329.5*   Basic Metabolic Panel: Recent Labs  Lab 01/12/22 0552 01/13/22 1939 01/14/22 1010 01/15/22 0252 01/16/22 0348 01/17/22 0553 01/18/22 0148  NA 131*   < > 130* 132* 128* 134* 135  K 4.1   < > 3.9 3.8 3.7 3.8 4.3  CL 101   < > 98 98 95* 97* 99  CO2 22   < > 23 26 25 29 29   GLUCOSE 103*   < > 129* 127* 102* 99 109*  BUN 25*   < > 31* 34* 35* 36* 38*  CREATININE 1.54*   < > 1.72* 1.55* 1.53* 1.49* 1.50*  CALCIUM 8.6*   < > 8.8* 8.5* 8.7* 8.9 8.8*  MG 2.2  --   --   --   --   --   --    < > = values in this interval not displayed.   Liver Function Tests: Recent Labs  Lab 01/13/22 1939  AST 281*  ALT 232*  ALKPHOS 178*  BILITOT 1.6*  PROT 7.3  ALBUMIN 3.5   No results for input(s): LIPASE, AMYLASE in the last 168 hours. No results for input(s): AMMONIA in the last 168 hours. CBC: Recent Labs  Lab 01/13/22 1939  WBC 6.7  NEUTROABS 4.6  HGB 12.9  HCT 39.6  MCV 86.7  PLT 154   Cardiac Enzymes: No results for input(s): CKTOTAL, CKMB, CKMBINDEX, TROPONINI in the last 168 hours. BNP: Invalid input(s): POCBNP CBG: No results for input(s): GLUCAP in the last 168 hours. D-Dimer No results for input(s): DDIMER in the last 72 hours. Hgb A1c No results for input(s): HGBA1C in the last 72 hours. Lipid Profile No results for input(s):  CHOL, HDL, LDLCALC, TRIG, CHOLHDL, LDLDIRECT in the last 72 hours. Thyroid function  studies No results for input(s): TSH, T4TOTAL, T3FREE, THYROIDAB in the last 72 hours.  Invalid input(s): FREET3 Anemia work up No results for input(s): VITAMINB12, FOLATE, FERRITIN, TIBC, IRON, RETICCTPCT in the last 72 hours. Urinalysis    Component Value Date/Time   COLORURINE YELLOW 01/08/2022 2230   APPEARANCEUR CLEAR 01/08/2022 2230   LABSPEC <1.005 (L) 01/08/2022 2230   PHURINE 5.5 01/08/2022 2230   GLUCOSEU NEGATIVE 01/08/2022 2230   HGBUR LARGE (A) 01/08/2022 2230   BILIRUBINUR NEGATIVE 01/08/2022 2230   KETONESUR NEGATIVE 01/08/2022 2230   PROTEINUR 30 (A) 01/08/2022 2230   UROBILINOGEN 0.2 11/27/2012 1120   NITRITE NEGATIVE 01/08/2022 2230   LEUKOCYTESUR NEGATIVE 01/08/2022 2230   Sepsis Labs Invalid input(s): PROCALCITONIN,  WBC,  LACTICIDVEN Microbiology Recent Results (from the past 240 hour(s))  Resp Panel by RT-PCR (Flu A&B, Covid) Nasopharyngeal Swab     Status: None   Collection Time: 01/08/22  8:09 PM   Specimen: Nasopharyngeal Swab; Nasopharyngeal(NP) swabs in vial transport medium  Result Value Ref Range Status   SARS Coronavirus 2 by RT PCR NEGATIVE NEGATIVE Final    Comment: (NOTE) SARS-CoV-2 target nucleic acids are NOT DETECTED.  The SARS-CoV-2 RNA is generally detectable in upper respiratory specimens during the acute phase of infection. The lowest concentration of SARS-CoV-2 viral copies this assay can detect is 138 copies/mL. A negative result does not preclude SARS-Cov-2 infection and should not be used as the sole basis for treatment or other patient management decisions. A negative result may occur with  improper specimen collection/handling, submission of specimen other than nasopharyngeal swab, presence of viral mutation(s) within the areas targeted by this assay, and inadequate number of viral copies(<138 copies/mL). A negative result must be combined with clinical observations, patient history, and epidemiological information. The  expected result is Negative.  Fact Sheet for Patients:  EntrepreneurPulse.com.au  Fact Sheet for Healthcare Providers:  IncredibleEmployment.be  This test is no t yet approved or cleared by the [place] FDA and  has been authorized for detection and/or diagnosis of SARS-CoV-2 by FDA under an Emergency Use Authorization (EUA). This EUA will remain  in effect (meaning this test can be used) for the duration of the COVID-19 declaration under Section 564(b)(1) of the Act, 21 U.S.C.section 360bbb-3(b)(1), unless the authorization is terminated  or revoked sooner.       Influenza A by PCR NEGATIVE NEGATIVE Final   Influenza B by PCR NEGATIVE NEGATIVE Final    Comment: (NOTE) The Xpert Xpress SARS-CoV-2/FLU/RSV plus assay is intended as an aid in the diagnosis of influenza from Nasopharyngeal swab specimens and should not be used as a sole basis for treatment. Nasal washings and aspirates are unacceptable for Xpert Xpress SARS-CoV-2/FLU/RSV testing.  Fact Sheet for Patients: EntrepreneurPulse.com.au  Fact Sheet for Healthcare Providers: IncredibleEmployment.be  This test is not yet approved or cleared by the [place] FDA and has been authorized for detection and/or diagnosis of SARS-CoV-2 by FDA under an Emergency Use Authorization (EUA). This EUA will remain in effect (meaning this test can be used) for the duration of the COVID-19 declaration under Section 564(b)(1) of the Act, 21 U.S.C. section 360bbb-3(b)(1), unless the authorization is terminated or revoked.  Performed at [facility], [address] [place], [place] [address]   Resp Panel by RT-PCR (Flu A&B, Covid) Nasopharyngeal Swab     Status: None   Collection Time: 01/13/22  7:39 PM   Specimen: Nasopharyngeal Swab; Nasopharyngeal(NP) swabs in vial transport medium  Result Value Ref Range Status   SARS Coronavirus 2 by RT PCR  NEGATIVE NEGATIVE Final    Comment: (NOTE) SARS-CoV-2 target nucleic acids are NOT DETECTED.  The SARS-CoV-2 RNA is generally detectable in upper respiratory specimens during the acute phase of infection. The lowest concentration of SARS-CoV-2 viral copies this assay can detect is 138 copies/mL. A negative result does not preclude SARS-Cov-2 infection and should not be used as the sole basis for treatment or other patient management decisions. A negative result may occur with  improper specimen collection/handling, submission of specimen other than nasopharyngeal swab, presence of viral mutation(s) within the areas targeted by this assay, and inadequate number of viral copies(<138 copies/mL). A negative result must be combined with clinical observations, patient history, and epidemiological information. The expected result is Negative.  Fact Sheet for Patients:  EntrepreneurPulse.com.au  Fact Sheet for Healthcare Providers:  IncredibleEmployment.be  This test is no t yet approved or cleared by the [place] FDA and  has been authorized for detection and/or diagnosis of SARS-CoV-2 by FDA under an Emergency Use Authorization (EUA). This EUA will remain  in effect (meaning this test can be used) for the duration of the COVID-19 declaration under Section 564(b)(1) of the Act, 21 U.S.C.section 360bbb-3(b)(1), unless the authorization is terminated  or revoked sooner.       Influenza A by PCR NEGATIVE NEGATIVE Final   Influenza B by PCR NEGATIVE NEGATIVE Final    Comment: (NOTE) The Xpert Xpress SARS-CoV-2/FLU/RSV plus assay is intended as an aid in the diagnosis of influenza from Nasopharyngeal swab specimens and should not be used as a sole basis for treatment. Nasal washings and aspirates are unacceptable for Xpert Xpress SARS-CoV-2/FLU/RSV testing.  Fact Sheet for Patients: EntrepreneurPulse.com.au  Fact Sheet for  Healthcare Providers: IncredibleEmployment.be  This test is not yet approved or cleared by the [place] FDA and has been authorized for detection and/or diagnosis of SARS-CoV-2 by FDA under an Emergency Use Authorization (EUA). This EUA will remain in effect (meaning this test can be used) for the duration of the COVID-19 declaration under Section 564(b)(1) of the Act, 21 U.S.C. section 360bbb-3(b)(1), unless the authorization is terminated or revoked.  Performed at [facility] [address] [place], [place] [address]      Time coordinating discharge: 45 minutes  SIGNED:   Tawni Millers, MD  [facility] 01/18/2022, 8:49 AM

## 2022-01-18 NOTE — Plan of Care (Signed)
°  Problem: Clinical Measurements: Goal: Respiratory complications will improve Outcome: Progressing   Problem: Coping: Goal: Level of anxiety will decrease Outcome: Progressing   Problem: Elimination: Goal: Will not experience complications related to bowel motility Outcome: Progressing Goal: Will not experience complications related to urinary retention Outcome: Progressing   Problem: Pain Managment: Goal: General experience of comfort will improve Outcome: Progressing   Problem: Safety: Goal: Ability to remain free from injury will improve Outcome: Progressing   Problem: Skin Integrity: Goal: Risk for impaired skin integrity will decrease Outcome: Progressing

## 2022-01-18 NOTE — TOC Transition Note (Addendum)
Transition of Care [facility]) - CM/SW Discharge Note   Patient Details  Name: Veronica Young MRN: [id number] Date of Birth: 1933-10-14  [facility]) CM/SW Contact:  Bethena Roys, RN Phone Number: 01/18/2022, 9:58 AM   Clinical Narrative:  Case Manager spoke with the patients daughter and she has oxygen at home via [employer]. Case Manager called [employer] and a portable tank will be delivered to the room. Daughter will provide transportation home. No further needs from Case Manager at this time.   1150 01-18-22 Interpreter was in the room and the patient declined the e-tank. Case Manager was unaware that the patient was going to a party on Saturday and she wanted a smaller oxygen tank. Case Manager was able to call [employer] Liaison  to come to the room to discuss oxygen needs in the home. [employer] will send a technician to the home to deliver D-tanks to the home. D-tanks will last at least 3.5 hours. [employer] will now deliver the e-tank for travel and the patient is agreeable to the plan of care. No further needs from Case Manager at this time.  Final next level of care: [place] Barriers to Discharge: Continued Medical Work up     Discharge Plan and Services In-house Referral: NA Discharge Planning Services: CM Consult Post Acute Care Choice: Home Health, Resumption of Svcs/PTA Provider          DME Arranged: N/A DME Agency: NA       HH Arranged: RN, Disease Management, PT, OT HH Agency: [facility] Date [place]: 01/15/22 Time [place]: [place] Representative spoke with at [place]: [name]   Readmission Risk Interventions No flowsheet data found.

## 2022-01-18 NOTE — Progress Notes (Signed)
Mobility Specialist Progress Note    01/18/22 1019  Mobility  Bed Position Chair  Activity Ambulated with assistance in hallway  Level of Assistance Contact guard assist, steadying assist  Assistive Device Front wheel walker  Distance Ambulated (ft) 170 ft  Activity Response Tolerated fair  $Mobility charge 1 Mobility   Pre-Mobility: 61 HR, 122/72 BP, 91% SpO2 During Mobility: 90% SpO2 Post-Mobility: 60 HR, 91% SpO2  Pt received in bed and agreeable. Took x1 short standing rest break and encouraged taking deeper breaths. Returned to chair with call bell in reach.   Utilized in-person interpreter that was present upon arrival.   [facility] Mobility Specialist  M.S. 2C and 6E: [phone] M.S. 4E: [phone]

## 2022-01-18 NOTE — Progress Notes (Addendum)
Progress Note  Patient Name: Veronica Young Date of Encounter: 01/18/2022  [facility] Cardiologist: Peter [name], MD   Subjective   No acute overnight events. Patient doing well this morning. She denies any shortness of breath or chest pain. She is down to 2L of O2 via nasal cannula and is eager to go home.  Inpatient Medications    Scheduled Meds:  apixaban  2.5 mg Oral BID   brimonidine  1 drop Both Eyes BID   escitalopram  10 mg Oral Daily   fluorometholone  1 drop Left Eye QID   gabapentin  100 mg Oral Daily   latanoprost  1 drop Both Eyes BID   levothyroxine  112 mcg Oral Q0600   metoprolol tartrate  12.5 mg Oral BID   sodium chloride flush  3 mL Intravenous Q12H   Continuous Infusions:  sodium chloride     PRN Meds: sodium chloride, acetaminophen, Muscle Rub, ondansetron (ZOFRAN) IV, sodium chloride flush   Vital Signs    Vitals:   01/17/22 1147 01/17/22 2028 01/18/22 0604 01/18/22 0855  BP: 101/63 105/61 127/79 103/64  Pulse: 70 74 63 62  Resp: _0 Temp: 98.4 F (36.9 C) 97.7 F (36.5 C)    TempSrc: Oral Oral Oral   SpO2:  93% 97% 92%  Weight:      Height:        Intake/Output Summary (Last 24 hours) at 01/18/2022 0856 Last data filed at 01/18/2022 0700 Gross per 24 hour  Intake 240 ml  Output 400 ml  Net -160 ml   Last 3 Weights 01/17/2022 01/16/2022 01/15/2022  Weight (lbs) 129 lb 4.8 oz 134 lb 7.7 oz 132 lb 4.4 oz  Weight (kg) 58.65 kg 61 kg 60 kg      Telemetry    Atrial fibrillation with intermittently ventricular paced rhythm. Rates in the 34s. - Personally Reviewed  ECG    No new ECG tracing today. - Personally Reviewed  Physical Exam   GEN: No acute distress. Congenitally deaf. Neck: Elevated JVD (likely secondary to severe TR). Cardiac: Irregularly irregular rhythm with normal rate. II/VI systolic murmur along lower sternum border. No murmurs, rubs, or gallops.  Respiratory: Clear to auscultation bilaterally. No wheezes,  rhonchi, or rales. GI: Soft, non-distended, and non-tender.  MS: 1+ lower extremity edema bilaterally. No deformity. Skin: Warm and dry. Neuro:  No focal deficits. Psych: Normal affect.   Labs    High Sensitivity Troponin:  No results for input(s): TROPONINIHS in the last 720 hours.   Chemistry Recent Labs  Lab 01/12/22 0552 01/13/22 1939 01/14/22 1010 01/16/22 0348 01/17/22 0553 01/18/22 0148  NA 131* 127*   < > 128* 134* 135  K 4.1 4.6   < > 3.7 3.8 4.3  CL 101 95*   < > 95* 97* 99  CO2 22 22   < > _1 GLUCOSE 103* 122*   < > 102* 99 109*  BUN 25* 32*   < > 35* 36* 38*  CREATININE 1.54* 1.61*   < > 1.53* 1.49* 1.50*  CALCIUM 8.6* 9.1   < > 8.7* 8.9 8.8*  MG 2.2  --   --   --   --   --   PROT  --  7.3  --   --   --   --   ALBUMIN  --  3.5  --   --   --   --   AST  --  281*  --   --   --   --   ALT  --  232*  --   --   --   --   ALKPHOS  --  178*  --   --   --   --   BILITOT  --  1.6*  --   --   --   --   GFRNONAA 33* 31*   < > 33* 34* 34*  ANIONGAP 8 10   < > _0 < > = values in this interval not displayed.    Lipids No results for input(s): CHOL, TRIG, HDL, LABVLDL, LDLCALC, CHOLHDL in the last 168 hours.  Hematology Recent Labs  Lab 01/13/22 1939  WBC 6.7  RBC 4.57  HGB 12.9  HCT 39.6  MCV 86.7  MCH 28.2  MCHC 32.6  RDW 21.2*  PLT 154   Thyroid No results for input(s): TSH, FREET4 in the last 168 hours.  BNP Recent Labs  Lab 01/13/22 1939  BNP 767.6*    DDimer  Recent Labs  Lab 01/13/22 1939  DDIMER 2.35*     Radiology    No results found.  Cardiac Studies   Echocardiogram 01/14/2022: Impressions:  1. Left ventricular ejection fraction, by estimation, is 60 to 65%. The  left ventricle has normal function. The left ventricle has no regional  wall motion abnormalities. There is mild left ventricular hypertrophy.  Left ventricular diastolic parameters  are indeterminate.   2. Significant TR cannot r/o that pacing lead is tenting  one of the  tricuspid leaflets Image 18 The RV lead is a medtronic MRI safe 5076 and  appears very taut with little slack . Right ventricular systolic function  is mildly reduced. The right  ventricular size is mildly enlarged.   3. Left atrial size was mildly dilated.   4. Right atrial size was severely dilated.   5. The pericardial effusion is posterior to the left ventricle.   6. The mitral valve is abnormal. Trivial mitral valve regurgitation. No  evidence of mitral stenosis.   7. Tricuspid valve regurgitation is moderate. Severe tricuspid stenosis.   8. The aortic valve is tricuspid. There is mild calcification of the  aortic valve. Aortic valve regurgitation is not visualized. Aortic valve  sclerosis is present, with no evidence of aortic valve stenosis.   9. The inferior vena cava is dilated in size with <50% respiratory  variability, suggesting right atrial pressure of 15 mmHg.   Patient Profile     88 y.o. female with a history of non-obstructive CAD on cardiac cath in 2005, atrial fibrillation on Eliquis, chronic diastolic CHF, sick sinus syndrome s/p Medtronic PPM in 2015, hypertension, hyperlipidemia, hypothyroidism, CKD stage III, and congenital deafness who is being seen for further evaluation of CHF at the request of Dr. Wynelle Cleveland.  Assessment & Plan    Chronic Diastolic CHF Right Sided CHF Severe Pulmonary Artery Hypertension  BNP elevated at 767 on admission (down from 942 on 1/17). Chest x-ray showed cardiomegaly with vascular congestion and mild interstitial edema. Echo showed LVEF of 60-65%, normal wall motion, mild LVH, mildly enlarged RV with mildly reduced RV systolic function, severe TR, mildly dilated left atrium and severely dilated right atrium.Also showed severe pulmonary artery hypertensin with systolic PASP around 159 mmHg. She has been diuresed with IV Lasix. I/Os have not been documented consistently but had 400 mL of output yesterday. No updated weight today  but was down 5lbs yesterday.  -  Will restart PO Lasix but increase it to 68m daily. - Offer compression stockings to help with edema but patient declined. - Monitor daily weights, I/O's, and renal function. Discussed importance of daily weights and sodium/fluid restrictions yesterday. Recommend limiting fluid intake to 1,500cc per day. - In regards to severe PAH, she has no known chronic lung disease or OSA and she is fully anticoagulated for years (making CTEPH less likely). 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 Will arrange outpatient PYP scan after discharge. Multiple myeloma panel pending.   Non-Obstructive CAD Noted on cath in 2005.  - No angina. - No aspirin due to need for Eliquis. - Does not appear to be on a statin.   Paroxysmal Atrial Fibrillation SSS s/p Medtronic PPM Currently in rate controlled atrial fibrillation with intermittent ventricular pacing. Rates in the 667s - Continue Lopressor 12.540mtwice daily. - Continue Eliquis 2.93m91mwice daily (reduced dose due to age and renal function).   Hypertension BP soft at times but stable. - Continue low dose Lopressor as above.   CKD Stage III Baseline around 1.5 to 1.7. - Creatinine stable at 1.50 today. - Continue to monitor with diuresis.   Otherwise, per primary team.  For questions or updates, please contact [facility] consult [email or web address] for contact info under        Signed, CalDarreld McleanA-C  01/18/2022, 8:56 AM    I have seen and examined the patient along with CalDarreld McleanA-C.   I have reviewed the chart, notes and new data.  I agree with PA/NP's note.  Key new complaints: Feeling better.  Still has JVD and mild ankle swelling.  Weight stable. Key examination changes: Oxygen requirements down to 2 L/minute. Key new findings / data: Renal parameters, normal potassium.  PLAN: Okay for discharge home today.  Still has evidence of  right heart failure, but this is likely to persist even when we have achieved best possible volume status, due to severity of PAH and TR.  [facility] will sign off.   Medication Recommendations:   Furosemide 80 mg once daily Eliquis 2.5 mg twice daily Metoprolol 12.5 mg twice daily Potassium chloride 20 mEq daily Other recommendations (labs, testing, etc): 2 g/day sodium restriction and 1500 mL/day fluid intake restriction.   Recommend daily weight monitoring.  Call office promptly for weight gain of 3 pounds in 1 night or 5 pounds within 1 week or if weight exceeds 131 pounds on home scale.   We will schedule for outpatient technetium 64m73m scan for possible cardiac amyloidosis. Follow up as an outpatient: Follow-up 2 weeks in cardiology office   MihaSanda Klein, [id number]/2023, 10:19 AM

## 2022-01-20 ENCOUNTER — Telehealth: Payer: Self-pay | Admitting: Student in an Organized Health Care Education/Training Program

## 2022-01-20 NOTE — Telephone Encounter (Signed)
Returned page from Mining engineer. Stana Bunting (daughter) reports more fluid in LE. Calves are tighter than prior and swollen. Goal wt <130 lb. This morning she weighed 126.5 lb. O2 was stable throughout the day, stable on 2 L O2. Takes lasix 80 mg daily in the AM. Daughter gave her additional lasix 80 mg tonight (instead of 40 mg).   Asked daughter to continue on lasix 80 mg daily if >130 lb in the morning. She will hold lasix tomorrow if wt < 128 lb.

## 2022-01-21 ENCOUNTER — Telehealth: Payer: Self-pay | Admitting: Cardiology

## 2022-01-21 LAB — MULTIPLE MYELOMA PANEL, SERUM
Albumin SerPl Elph-Mcnc: 2.7 g/dL — ABNORMAL LOW (ref 2.9–4.4)
Albumin/Glob SerPl: 1.1 (ref 0.7–1.7)
Alpha 1: 0.3 g/dL (ref 0.0–0.4)
Alpha2 Glob SerPl Elph-Mcnc: 0.4 g/dL (ref 0.4–1.0)
B-Globulin SerPl Elph-Mcnc: 0.9 g/dL (ref 0.7–1.3)
Gamma Glob SerPl Elph-Mcnc: 1 g/dL (ref 0.4–1.8)
Globulin, Total: 2.6 g/dL (ref 2.2–3.9)
IgA: 233 mg/dL (ref 64–422)
IgG (Immunoglobin G), Serum: 919 mg/dL (ref 586–1602)
IgM (Immunoglobulin M), Srm: 223 mg/dL — ABNORMAL HIGH (ref 26–217)
Total Protein ELP: 5.3 g/dL — ABNORMAL LOW (ref 6.0–8.5)

## 2022-01-21 NOTE — Telephone Encounter (Signed)
Pts daughter has some concerns about swelling and would like to speak with Nurse Malachy Mood in regards to next steps... please advise

## 2022-01-21 NOTE — Telephone Encounter (Signed)
According to weight looks like volume status is ok. Agree with lasix 80 mg daily and extra 40 mg in the pm as needed for weight gain as previously instructed.  Nili Honda [name] MD, [facility]

## 2022-01-21 NOTE — Telephone Encounter (Signed)
Daughter notified of MD advice   She also asked for a diff appt time for amyloid scan Explained that these are only done at certain times and none on Mondays Advised would want this test done before her 2/10 f/u appt with NP She will keep scheduled test time

## 2022-01-21 NOTE — Telephone Encounter (Signed)
Returned call to patient's daughter -- patient of Dr. [name] Patient has been in the hospital recently. Her legs have looked more swollen/tight  She is now taking lasix 80mg  daily  She called on-call last night: January 21, 2022 Dion Body, MD   12:04 AM Note Returned page from operator. Veronica Young (daughter) reports more fluid in LE. Calves are tighter than prior and swollen. Goal wt <130 lb. This morning she weighed 126.5 lb. O2 was stable throughout the day, stable on 2 L O2. Takes lasix 80 mg daily in the AM. Daughter gave her additional lasix 80 mg tonight (instead of 40 mg).    Asked daughter to continue on lasix 80 mg daily if >130 lb in the morning. She will hold lasix tomorrow if wt < 128 lb.      Today, daughter reports her legs are still tight/swollen "not too bad". She does not report an indention is left in the leg when pressing/palpating LE. Legs hurt when squeezing/pressing on legs though. She has varicose veins. She follows a low-sodium diet but did have "no sodium added soup" that contained 60mg  Na+. She drinks 1/2 c coffee, 3oz OJ, 3oz milk, some water, ensure 8oz 1-2x/daily. She thinks she has OK UOP but then states she is watching her PO intake so closely - she is not sure of the color/amount really as patient is independent in this. Advised to monitor for color, amount, odor. She rests with legs elevated. She has not worn compression stockings. Advised that she obtain compression stockings to try.   Weight log: Saturday: 130lbs Sunday: 126.5lbs - took lasix 80mg  AM and PM Monday: 124.5lbs - gave lasix 80mg  daily  Advised will send message to MD to review

## 2022-01-23 ENCOUNTER — Telehealth: Payer: Self-pay | Admitting: Physician Assistant

## 2022-01-23 NOTE — Telephone Encounter (Signed)
Paged by answering service.  Daughter Chelsea Primus stated patient had bleeding while trying to cut left big toe nail.  Bleeding has stopped.  Applied Neosporin ointment.  I have discussed alarming symptoms like nonhealing ulcer.  She will call if no improvement.

## 2022-01-24 ENCOUNTER — Telehealth (HOSPITAL_COMMUNITY): Payer: Self-pay | Admitting: *Deleted

## 2022-01-24 NOTE — Telephone Encounter (Signed)
Close encounter 

## 2022-01-25 ENCOUNTER — Ambulatory Visit (HOSPITAL_COMMUNITY)
Admission: RE | Admit: 2022-01-25 | Discharge: 2022-01-25 | Disposition: A | Discharge status: Home / Self Care | Payer: Medicare HMO | Source: Ambulatory Visit | Attending: Cardiology | Admitting: Cardiology

## 2022-01-25 ENCOUNTER — Other Ambulatory Visit: Payer: Self-pay

## 2022-01-25 DIAGNOSIS — I5032 Chronic diastolic (congestive) heart failure: Secondary | ICD-10-CM | POA: Diagnosis not present

## 2022-01-25 MED ORDER — TECHNETIUM TC 99M PYROPHOSPHATE
20.5000 | Freq: Once | INTRAVENOUS | Status: AC
  Administered 2022-01-25: 20.5 via INTRAVENOUS

## 2022-01-31 ENCOUNTER — Other Ambulatory Visit: Payer: Self-pay | Admitting: Cardiology

## 2022-01-31 NOTE — Progress Notes (Signed)
Cardiology Clinic Note   Patient Name: Veronica Young Date of Encounter: 02/01/2022  Primary Care Provider:  Katherina Mires, MD Primary Cardiologist:  Peter [name], MD  Patient Profile    88 year old female patient with history of congenital deafness, chronic kidney disease stage IIIb, chronic diastolic CHF, atrial fibrillation, who was recently hospitalized on 01/18/2022 with significant lower extremity edema and hypotension with a blood pressure of 90/53.  She was treated for acute on chronic diastolic heart failure in the setting of severe tricuspid regurgitation and pulmonary hypertension.  She was diuresed with IV furosemide.    Echocardiogram completed during hospitalization revealed an EF of 60 to 65% with reduced RV systolic function, significant tricuspid regurgitation, RV size was mildly enlarged with RA severe dilatation and tricuspid stenosis.  She was sent home on furosemide 80 mg daily and to take an extra 40 mg for weight gain or volume overload.  Metoprolol was decreased to 12.5 mg twice daily to prevent hypotension.  She was sent home on supplemental oxygen per nasal cannula at 3 L/min hydralazine was discontinued.  She was to continue apixaban 2.5 mg twice daily for CHA2DS2-VASc score of 4.   Past Medical History    Past Medical History:  Diagnosis Date   Carotid bruit    CHF (congestive heart failure) (HCC)    Deafness    Diabetes mellitus    type 2   Diastolic heart failure (HCC)    EF is 60%   GERD (gastroesophageal reflux disease)    Glaucoma    Hypercholesterolemia    Hypertension    Hypothyroidism    Normal cardiac stress test January 2014   PAD (peripheral artery disease) ([place])    a. known bilateral popliteal occlusions with conservative management favored.   PAT (paroxysmal atrial tachycardia) (HCC)    Sick sinus syndrome [facility])    a. s/p PPM placement 07/2014   Past Surgical History:  Procedure Laterality Date   cholycystectomy     left breast cyst  removed     PACEMAKER INSERTION  07-26-2014   MDT ADDRL1 pacemaker implanted by Dr Lovena Le for [place] N/A 07/26/2014   Procedure: PERMANENT PACEMAKER INSERTION;  Surgeon: Evans Lance, MD;  Location: [facility] CATH LAB;  Service: Cardiovascular;  Laterality: N/A;   TOTAL KNEE ARTHROPLASTY     right    Allergies  Allergies  Allergen Reactions   Amlodipine Nausea Only        Benicar [Olmesartan Medoxomil] Nausea Only    History of Present Illness    [name] presents today for posthospitalization follow-up after being admitted for acute on chronic diastolic CHF, with history as above to include atrial fibrillation with a CHA2DS2-VASc score of 4 and hypertension.  She is also on O2 via nasal cannula at 3 L.  Her daughter did call our office on 01/23/2022 after noticing that she had a bleeding episode after trying to cut her left big toenail.  No new orders were given and she was to monitor for continuation of bleeding.  She also had myocardial amyloid imaging study completed which was normal and not suggestive of amyloidosis on 01/25/2022.  This patient is deaf, and her daughter and an interpreter come with her to help with communication.  The patient denies any complaints of swelling, dizziness, chest pressure, or significant dyspnea on exertion.  She does wear oxygen via nasal cannula but does not always keep it on consistently.  She does get short of  breath when she stops wearing it for a period of time, during bathing or short distance walking.  She and her daughter have multiple questions concerning test results, and information about her tricuspid regurgitation.  Explanations and review of test results are provided during this office visit.  Home Medications    Current Outpatient Medications  Medication Sig Dispense Refill   apixaban (ELIQUIS) 2.5 MG TABS tablet Take 1 tablet (2.5 mg total) by mouth 2 (two) times daily. 180 tablet 1   atropine 1 % ophthalmic  solution Place 1 drop into the left eye at bedtime.      brimonidine (ALPHAGAN) 0.2 % ophthalmic solution Place 1 drop into both eyes 2 (two) times daily.     calcium-vitamin D (OSCAL WITH D) 500-200 MG-UNIT per tablet Take 1 tablet by mouth daily with breakfast.     cholecalciferol (VITAMIN D) 1000 UNITS tablet Take 1,000 Units by mouth daily.     escitalopram (LEXAPRO) 10 MG tablet Take 10 mg by mouth daily.     feeding supplement (ENSURE ENLIVE / ENSURE PLUS) LIQD Take 237 mLs by mouth 3 (three) times daily between meals. (Patient taking differently: Take 237 mLs by mouth 2 (two) times daily between meals.) 237 mL 12   fluorometholone (FML) 0.1 % ophthalmic suspension Place 1 drop into the left eye 4 (four) times daily.     furosemide (LASIX) 40 MG tablet Take 2 tablets (80 mg total) by mouth daily. 90 tablet 0   gabapentin (NEURONTIN) 100 MG capsule Take 100 mg by mouth at bedtime.     latanoprost (XALATAN) 0.005 % ophthalmic solution Place 1 drop into both eyes in the morning and at bedtime.   12   levothyroxine (SYNTHROID, LEVOTHROID) 112 MCG tablet Take 112 mcg by mouth daily before breakfast.      metoprolol tartrate (LOPRESSOR) 25 MG tablet Take 0.5 tablets (12.5 mg total) by mouth 2 (two) times daily. 180 tablet 0   Multiple Vitamin (MULTIVITAMIN WITH MINERALS) TABS tablet Take 1 tablet by mouth daily. 30 tablet 0   OXYGEN Inhale into the lungs. 2 liters as needed     potassium chloride SA (KLOR-CON M) 20 MEQ tablet TAKE 1 TABLET EVERY DAY 90 tablet 3   RHOPRESSA 0.02 % SOLN Place 1 drop into the left eye at bedtime.     acetaminophen (TYLENOL) 500 MG tablet Take 1,000 mg by mouth every 6 (six) hours as needed for moderate pain. (Patient not taking: Reported on 02/01/2022)     No current facility-administered medications for this visit.     Family History    Family History  Problem Relation Age of Onset   Heart disease Mother    Liver cancer Father    Heart disease Sister     Heart disease Brother    Stroke Sister    She indicated that her mother is deceased. She indicated that her father is deceased. She indicated that five of her seven sisters are alive. She indicated that only one of her two brothers is alive.  Social History    Social History   Socioeconomic History   Marital status: Widowed    Spouse name: Not on file   Number of children: 4   Years of education: Not on file   Highest education level: Not on file  Occupational History   Not on file  Tobacco Use   Smoking status: Never   Smokeless tobacco: Never  Substance and Sexual Activity   Alcohol use:  No   Drug use: No   Sexual activity: Never  Other Topics Concern   Not on file  Social History Narrative   Not on file   Social Determinants of Health   Financial Resource Strain: Not on file  Food Insecurity: Not on file  Transportation Needs: Not on file  Physical Activity: Not on file  Stress: Not on file  Social Connections: Not on file  Intimate Partner Violence: Not on file     Review of Systems    General:  No chills, fever, night sweats or weight changes.  Cardiovascular:  No chest pain, occasional dyspnea on exertion, positive for edema, orthopnea, palpitations, paroxysmal nocturnal dyspnea. Dermatological: No rash, lesions/masses Respiratory: No cough, occasional dyspnea when she does not wear oxygen. Urologic: No hematuria, dysuria Abdominal:   No nausea, vomiting, diarrhea, bright red blood per rectum, melena, or hematemesis Neurologic:  No visual changes, wkns, changes in mental status. All other systems reviewed and are otherwise negative except as noted above.     Physical Exam    VS:  BP 112/78    Pulse 70    Ht 5\' 7"  (1.702 m)    Wt 126 lb 12.8 oz (57.5 kg)    SpO2 97%    BMI 19.86 kg/m  , BMI Body mass index is 19.86 kg/m.     GEN: Well nourished, well developed, in no acute distress.,  Frail sitting in a wheelchair HEENT: normal. Neck: Supple, no JVD,  carotid bruits, or masses. Cardiac: RRR, 2/6 systolic regurgitant murmurs, rubs, or gallops. No clubbing, cyanosis, 1+ to 2+ dependent edema.  Radials/DP/PT 2+ and equal bilaterally.  Respiratory:  Respirations regular and unlabored, clear to auscultation bilaterally.  Wearing oxygen via nasal cannula. GI: Soft, nontender, nondistended, BS + x 4. MS: no deformity or atrophy. Skin: warm and dry, no rash. Neuro:  Strength and sensation are intact.  Patient is deaf Psych: Normal affect.  Accessory Clinical Findings    ECG personally reviewed by me today-atrial fibrillation, rate of 70 bpm, T wave inversion noted laterally.  Artifact.- No acute changes  Lab Results  Component Value Date   WBC 6.7 01/13/2022   HGB 12.9 01/13/2022   HCT 39.6 01/13/2022   MCV 86.7 01/13/2022   PLT 154 01/13/2022   Lab Results  Component Value Date   CREATININE 1.50 (H) 01/18/2022   BUN 38 (H) 01/18/2022   NA 135 01/18/2022   K 4.3 01/18/2022   CL 99 01/18/2022   CO2 29 01/18/2022   Lab Results  Component Value Date   ALT 232 (H) 01/13/2022   AST 281 (H) 01/13/2022   ALKPHOS 178 (H) 01/13/2022   BILITOT 1.6 (H) 01/13/2022   Lab Results  Component Value Date   CHOL 141 04/04/2020   HDL 33 (L) 04/04/2020   LDLCALC 92 04/04/2020   TRIG 79 04/04/2020   CHOLHDL 4.3 04/04/2020    Lab Results  Component Value Date   HGBA1C 5.7 (H) 06/17/2017    Review of Prior Studies: Myocardial Amyloid Imaging 01/25/2022  By semi-quantitative assessment scan is consistent with no increased heart uptake-Grade 0. Heart to contralateral lung ratio is between 1-1.5, indeterminate for amyloid.   Study is normal, not suggestive of TTR amyloidosis (visual score of 0/ratio <1).   Prior study not available for comparison.  Echocardiogram 01/14/2022 1. Left ventricular ejection fraction, by estimation, is 60 to 65%. The  left ventricle has normal function. The left ventricle has no regional  wall  motion  abnormalities. There is mild left ventricular hypertrophy.  Left ventricular diastolic parameters  are indeterminate.   2. Significant TR cannot r/o that pacing lead is tenting one of the  tricuspid leaflets Image 18 The RV lead is a medtronic MRI safe 5076 and  appears very taut with little slack . Right ventricular systolic function  is mildly reduced. The right  ventricular size is mildly enlarged.   3. Left atrial size was mildly dilated.   4. Right atrial size was severely dilated.   5. The pericardial effusion is posterior to the left ventricle.   6. The mitral valve is abnormal. Trivial mitral valve regurgitation. No  evidence of mitral stenosis.   7. Tricuspid valve regurgitation is moderate. Severe tricuspid stenosis.   8. The aortic valve is tricuspid. There is mild calcification of the  aortic valve. Aortic valve regurgitation is not visualized. Aortic valve  sclerosis is present, with no evidence of aortic valve stenosis.   9. The inferior vena cava is dilated in size with <50% respiratory  variability, suggesting right atrial pressure of 15 mmHg  Assessment & Plan   1.  Chronic diastolic CHF: She does appear euvolemic on exam today.  She does have some dependent edema and I have discussed with her use of support hose which may help with this.  At first she was disinterested but I did talk with her about being properly measured and for her to wear them during the day and take them off at night.  She is aware of a low-sodium diet.  She is quite frail and I have asked her to be very careful about injuring her legs especially when they are edematous to prevent any issues with cellulitis with skin breaks.  2.  Permanent atrial fibrillation: Heart rate is currently well controlled.  She is tolerating anticoagulation with apixaban 2.5 mg twice daily.  She did have a little bit of a nosebleed which was very brief likely related to oxygen via nasal cannula drying out nasal passages.   Currently no further issues with this.  Her daughter make sure she takes her medications daily and she offers no complaints about intolerances.  3.  Hyperthyroidism: Patient being followed by PCP with medication adjustments will at their discretion.  4.  Frailty: She does use a wheelchair as needed, but states she is able to walk to her home, perform her ADLs, and do her own cooking.  She does become fatigued easily.  She is on nutritional supplements.  Will defer to PCP for further work-up and assistance as needed.  I have cautioned her about falls on an anticoagulant.  Her daughter is very attentive and is providing a lot of assistance.   Current medicines are reviewed at length with the patient today.  I have spent 40 min's  dedicated to the care of this patient on the date of this encounter to include pre-visit review of records, assessment, management and diagnostic testing,with shared decision making.   Signed, Phill Myron. West Pugh, ANP, AACC   02/01/2022 4:51 PM    [facility] [place] [address] Office [phone] Fax [phone]  Notice: This dictation was prepared with Dragon dictation along with smaller phrase technology. Any transcriptional errors that result from this process are unintentional and may not be corrected upon review.

## 2022-02-01 ENCOUNTER — Other Ambulatory Visit: Payer: Self-pay

## 2022-02-01 ENCOUNTER — Encounter: Payer: Self-pay | Admitting: Adult Health

## 2022-02-01 ENCOUNTER — Ambulatory Visit: Payer: Medicare HMO | Admitting: Adult Health

## 2022-02-01 VITALS — BP 112/78 | HR 70 | Ht 67.0 in | Wt 126.8 lb

## 2022-02-01 DIAGNOSIS — I4821 Permanent atrial fibrillation: Secondary | ICD-10-CM

## 2022-02-01 DIAGNOSIS — I10 Essential (primary) hypertension: Secondary | ICD-10-CM | POA: Diagnosis not present

## 2022-02-01 DIAGNOSIS — R54 Age-related physical debility: Secondary | ICD-10-CM

## 2022-02-01 DIAGNOSIS — Z950 Presence of cardiac pacemaker: Secondary | ICD-10-CM

## 2022-02-01 DIAGNOSIS — I5032 Chronic diastolic (congestive) heart failure: Secondary | ICD-10-CM | POA: Diagnosis not present

## 2022-02-01 DIAGNOSIS — H9193 Unspecified hearing loss, bilateral: Secondary | ICD-10-CM

## 2022-02-01 MED ORDER — POTASSIUM CHLORIDE CRYS ER 20 MEQ PO TBCR: 20.0000 meq | EXTENDED_RELEASE_TABLET | Freq: Every day | ORAL | 3 refills | Status: AC

## 2022-02-01 NOTE — Telephone Encounter (Signed)
OK to refill  Veronica Claros [name] MD, [facility]

## 2022-02-01 NOTE — Telephone Encounter (Signed)
Potassium refill sent to pharmacy 

## 2022-02-01 NOTE — Addendum Note (Signed)
Addended by: Kathyrn Lass on: 02/01/2022 04:55 PM   Modules accepted: Orders

## 2022-02-01 NOTE — Patient Instructions (Signed)
Medication Instructions:  No Changes *If you need a refill on your cardiac medications before your next appointment, please call your pharmacy*   Lab Work: No Labs If you have labs (blood work) drawn today and your tests are completely normal, you will receive your results only by: [place] (if you have MyChart) OR A paper copy in the mail If you have any lab test that is abnormal or we need to change your treatment, we will call you to review the results.   Testing/Procedures: No Testing   Follow-Up: At [facility], you and your health needs are our priority.  As part of our continuing mission to provide you with exceptional heart care, we have created designated Provider Care Teams.  These Care Teams include your primary Cardiologist (physician) and Advanced Practice Providers (APPs -  Physician Assistants and Nurse Practitioners) who all work together to provide you with the care you need, when you need it.  We recommend signing up for the patient portal called "MyChart".  Sign up information is provided on this After Visit Summary.  MyChart is used to connect with patients for Virtual Visits (Telemedicine).  Patients are able to view lab/test results, encounter notes, upcoming appointments, etc.  Non-urgent messages can be sent to your provider as well.   To learn more about what you can do with MyChart, go to [email or web address].    Your next appointment:   Apr 22, 2022 11:20 AM  The format for your next appointment:   In Person  Provider:   Almyra Deforest, PA-C    :1}

## 2022-02-03 LAB — CUP PACEART REMOTE DEVICE CHECK
Battery Impedance: 894 Ohm
Battery Remaining Longevity: 62 mo
Battery Voltage: 2.77 V
Brady Statistic AP VP Percent: 5 %
Brady Statistic AP VS Percent: 1 %
Brady Statistic AS VP Percent: 5 %
Brady Statistic AS VS Percent: 89 %
Date Time Interrogation Session: 20230211135021
Implantable Lead Implant Date: 20150805
Implantable Lead Implant Date: 20150805
Implantable Lead Location: 753859
Implantable Lead Location: 753860
Implantable Lead Model: 5076
Implantable Lead Model: 5076
Implantable Pulse Generator Implant Date: 20150804
Lead Channel Impedance Value: 461 Ohm
Lead Channel Impedance Value: 475 Ohm
Lead Channel Pacing Threshold Amplitude: 0.75 V
Lead Channel Pacing Threshold Amplitude: 0.875 V
Lead Channel Pacing Threshold Pulse Width: 0.4 ms
Lead Channel Pacing Threshold Pulse Width: 0.4 ms
Lead Channel Setting Pacing Amplitude: 2 V
Lead Channel Setting Pacing Amplitude: 2.5 V
Lead Channel Setting Pacing Pulse Width: 0.4 ms
Lead Channel Setting Sensing Sensitivity: 4 mV

## 2022-02-04 ENCOUNTER — Ambulatory Visit (INDEPENDENT_AMBULATORY_CARE_PROVIDER_SITE_OTHER): Payer: Medicare HMO

## 2022-02-04 ENCOUNTER — Other Ambulatory Visit: Payer: Self-pay | Admitting: Cardiology

## 2022-02-04 DIAGNOSIS — I495 Sick sinus syndrome: Secondary | ICD-10-CM | POA: Diagnosis not present

## 2022-02-06 NOTE — Progress Notes (Signed)
Remote pacemaker transmission.   

## 2022-04-22 ENCOUNTER — Ambulatory Visit (INDEPENDENT_AMBULATORY_CARE_PROVIDER_SITE_OTHER): Payer: Medicare HMO | Admitting: Physician Assistant

## 2022-04-22 ENCOUNTER — Encounter: Payer: Self-pay | Admitting: Physician Assistant

## 2022-04-22 VITALS — BP 133/65 | HR 78 | Ht 65.0 in | Wt 114.0 lb

## 2022-04-22 DIAGNOSIS — I10 Essential (primary) hypertension: Secondary | ICD-10-CM

## 2022-04-22 DIAGNOSIS — Z79899 Other long term (current) drug therapy: Secondary | ICD-10-CM | POA: Diagnosis not present

## 2022-04-22 DIAGNOSIS — I5032 Chronic diastolic (congestive) heart failure: Secondary | ICD-10-CM

## 2022-04-22 DIAGNOSIS — I4819 Other persistent atrial fibrillation: Secondary | ICD-10-CM | POA: Diagnosis not present

## 2022-04-22 DIAGNOSIS — Z950 Presence of cardiac pacemaker: Secondary | ICD-10-CM

## 2022-04-22 DIAGNOSIS — I2510 Atherosclerotic heart disease of native coronary artery without angina pectoris: Secondary | ICD-10-CM

## 2022-04-22 DIAGNOSIS — E119 Type 2 diabetes mellitus without complications: Secondary | ICD-10-CM

## 2022-04-22 NOTE — Patient Instructions (Signed)
Medication Instructions:  ?Your physician recommends that you continue on your current medications as directed. Please refer to the Current Medication list given to you today. ? ?*If you need a refill on your cardiac medications before your next appointment, please call your pharmacy* ? ?Lab Work: ?Your physician recommends that you return for lab work TODAY:  ?BMET ?CBC ?If you have labs (blood work) drawn today and your tests are completely normal, you will receive your results only by: ?MyChart Message (if you have MyChart) OR ?A paper copy in the mail ?If you have any lab test that is abnormal or we need to change your treatment, we will call you to review the results. ? ?Testing/Procedures: ?NONE ordered at this time of appointment  ? ?Follow-Up: ?At [facility], you and your health needs are our priority.  As part of our continuing mission to provide you with exceptional heart care, we have created designated Provider Care Teams.  These Care Teams include your primary Cardiologist (physician) and Advanced Practice Providers (APPs -  Physician Assistants and Nurse Practitioners) who all work together to provide you with the care you need, when you need it. ? ?We recommend signing up for the patient portal called "MyChart".  Sign up information is provided on this After Visit Summary.  MyChart is used to connect with patients for Virtual Visits (Telemedicine).  Patients are able to view lab/test results, encounter notes, upcoming appointments, etc.  Non-urgent messages can be sent to your provider as well.   ?To learn more about what you can do with MyChart, go to [email or web address].   ? ?Your next appointment:   ?4 month(s) ? ?The format for your next appointment:   ?In Person ? ?Provider:   ?Peter [name], MD   ? ? ?Other Instructions ? ? ?Important Information About Sugar ? ? ? ? ? ? ?

## 2022-04-22 NOTE — Progress Notes (Signed)
?Cardiology Office Note:   ? ?Date:  04/23/2022  ? ?ID:  MCKYNLEIGH MUSSELL, DOB 02/20/1933, MRN [id number] ? ?PCP:  Katherina Mires, MD ?  ?[facility] Providers ?Cardiologist:  Peter [name], MD ?Electrophysiologist:  Cristopher Peru, MD    ? ?Referring MD: Katherina Mires, MD  ? ?Chief Complaint  ?Patient presents with  ? Follow-up  ?  Seen for Dr. [name]  ? ? ?History of Present Illness:   ? ?Veronica ANTONY is a 88 y.o. female with a hx of congenital deafness, CKD stage III, chronic diastolic CHF, persistent A-fib, SSS s/p PPM, CAD, PAD, hypertension, hyperlipidemia, DM2 and hypothyroidism.  She has a history of moderate nonobstructive CAD.  Most recent ischemic evaluation with stress test in 2014 which was normal.  In 2015, she had symptomatic bradycardia and had a Medtronic dual-chamber pacemaker implanted on 07/27/2014.  Echocardiogram in August 2018 showed EF 65 to 70%, grade 1 DD, moderate TR and MR.  Patient was admitted for acute on chronic diastolic heart failure in April 2021.  Repeat echocardiogram at the time showed EF 55 to 60%, RVSP elevated to 64.3 mmHg, moderate MR.  Patient was last seen by Dr. [name] in November 2022, at the time she was in rate controlled atrial fibrillation.  Patient was recently hospitalized in January 2023 with significant lower extremity edema and the hypotension.  Blood pressure on arrival was 90/53.  She was treated for acute on chronic diastolic heart failure in the setting of severe tricuspid regurgitation and pulmonary hypertension.  She underwent IV diuresis.  Echocardiogram obtained during the hospitalization showed EF 60 to 65%, reduced RV systolic function, significant tricuspid regurgitation, RV size was mildly enlarged with RA severely dilated.  She was sent home on 80 mg daily of Lasix with instruction to take additional 40 mg for weight gain and volume overload.  Metoprolol was decreased to 12.5 mg twice a day to prevent hypotension.  She is on 3 L/min supplemental home  oxygen.  PYP scan obtained on 02/01/2022 was negative for TTR amyloidosis. ? ?Patient presents today for follow-up along with sign language translator.  She denies any significant worsening dyspnea.  She has no lower extremity edema, her lungs is clear.  Overall, she is stable.  We will obtain CBC and basic metabolic panel.  I recommended 45-month follow-up. ? ? ? ?Past Medical History:  ?Diagnosis Date  ? Carotid bruit   ? CHF (congestive heart failure) ([place])   ? Deafness   ? Diabetes mellitus   ? type 2  ? Diastolic heart failure ([place])   ? EF is 60%  ? GERD (gastroesophageal reflux disease)   ? Glaucoma   ? Hypercholesterolemia   ? Hypertension   ? Hypothyroidism   ? Normal cardiac stress test January 2014  ? PAD (peripheral artery disease) ([place])   ? a. known bilateral popliteal occlusions with conservative management favored.  ? PAT (paroxysmal atrial tachycardia) ([place])   ? Sick sinus syndrome ([place])   ? a. s/p PPM placement 07/2014  ? ? ?Past Surgical History:  ?Procedure Laterality Date  ? cholycystectomy    ? left breast cyst removed    ? PACEMAKER INSERTION  07-26-2014  ? MDT ADDRL1 pacemaker implanted by Dr Lovena Le for SSS  ? PERMANENT PACEMAKER INSERTION N/A 07/26/2014  ? Procedure: PERMANENT PACEMAKER INSERTION;  Surgeon: Evans Lance, MD;  Location: [facility] C[facility] LAB;  Service: Cardiovascular;  Laterality: N/A;  ? TOTAL KNEE ARTHROPLASTY    ?  right  ? ? ?Current Medications: ?Current Meds  ?Medication Sig  ? apixaban (ELIQUIS) 2.5 MG TABS tablet Take 1 tablet (2.5 mg total) by mouth 2 (two) times daily.  ? atropine 1 % ophthalmic solution Place 1 drop into the left eye at bedtime.   ? brimonidine (ALPHAGAN) 0.2 % ophthalmic solution Place 1 drop into both eyes 2 (two) times daily.  ? calcium-vitamin D (OSCAL WITH D) 500-200 MG-UNIT per tablet Take 1 tablet by mouth daily with breakfast.  ? cholecalciferol (VITAMIN D) 1000 UNITS tablet Take 1,000 Units by mouth daily.  ? escitalopram (LEXAPRO) 10 MG tablet Take 10 mg  by mouth daily.  ? feeding supplement (ENSURE ENLIVE / ENSURE PLUS) LIQD Take 237 mLs by mouth 3 (three) times daily between meals. (Patient taking differently: Take 237 mLs by mouth 2 (two) times daily between meals.)  ? furosemide (LASIX) 40 MG tablet Take 2 tablets (80 mg total) by mouth daily.  ? gabapentin (NEURONTIN) 100 MG capsule Take 100 mg by mouth at bedtime.  ? gabapentin (NEURONTIN) 100 MG capsule Take 1 capsule by mouth daily.  ? metoprolol tartrate (LOPRESSOR) 25 MG tablet TAKE 1 TABLET (25 MG TOTAL) BY MOUTH 2 (TWO) TIMES DAILY.  ? Multiple Vitamin (MULTIVITAMIN WITH MINERALS) TABS tablet Take 1 tablet by mouth daily.  ? OXYGEN Inhale into the lungs. 2 liters as needed  ? RHOPRESSA 0.02 % SOLN Place 1 drop into the left eye at bedtime.  ?  ? ?Allergies:   Amlodipine and Benicar [olmesartan medoxomil]  ? ?Social History  ? ?Socioeconomic History  ? Marital status: Widowed  ?  Spouse name: Not on file  ? Number of children: 4  ? Years of education: Not on file  ? Highest education level: Not on file  ?Occupational History  ? Not on file  ?Tobacco Use  ? Smoking status: Never  ? Smokeless tobacco: Never  ?Substance and Sexual Activity  ? Alcohol use: No  ? Drug use: No  ? Sexual activity: Never  ?Other Topics Concern  ? Not on file  ?Social History Narrative  ? Not on file  ? ?Social Determinants of Health  ? ?Financial Resource Strain: Not on file  ?Food Insecurity: Not on file  ?Transportation Needs: Not on file  ?Physical Activity: Not on file  ?Stress: Not on file  ?Social Connections: Not on file  ?  ? ?Family History: ?The patient's family history includes Heart disease in her brother, mother, and sister; Liver cancer in her father; Stroke in her sister. ? ?ROS:   ?Please see the history of present illness.    ? All other systems reviewed and are negative. ? ?EKGs/Labs/Other Studies Reviewed:   ? ?The following studies were reviewed today: ? ?Echo 01/14/2022 ? 1. Left ventricular ejection fraction,  by estimation, is 60 to 65%. The left ventricle has normal function. The left ventricle has no regional wall motion abnormalities. There is mild left ventricular hypertrophy. Left ventricular diastolic parameters are indeterminate.  ? 2. Significant TR cannot r/o that pacing lead is tenting one of the tricuspid leaflets Image 18 The RV lead is a medtronic MRI safe 5076 and appears very taut with little slack . Right ventricular systolic function is mildly reduced. The right ventricular size is mildly enlarged.  ? 3. Left atrial size was mildly dilated.  ? 4. Right atrial size was severely dilated.  ? 5. The pericardial effusion is posterior to the left ventricle.  ? 6. The mitral  valve is abnormal. Trivial mitral valve regurgitation. No evidence of mitral stenosis.  ? 7. Tricuspid valve regurgitation is moderate. Severe tricuspid stenosis.  ? 8. The aortic valve is tricuspid. There is mild calcification of the aortic valve. Aortic valve regurgitation is not visualized. Aortic valve sclerosis is present, with no evidence of aortic valve stenosis.  ? 9. The inferior vena cava is dilated in size with <50% respiratory variability, suggesting right atrial pressure of 15 mmHg.   ? ? ?EKG:  EKG is not ordered today.  ? ?Recent Labs: ?01/12/2022: Magnesium 2.2 ?01/13/2022: ALT 232; B Natriuretic Peptide 767.6 ?04/22/2022: BUN 31; Creatinine, Ser 1.40; Hemoglobin 12.6; Platelets 144; Potassium 4.5; Sodium 138  ?Recent Lipid Panel ?   ?Component Value Date/Time  ? CHOL 141 04/04/2020 0354  ? TRIG 79 04/04/2020 0354  ? HDL 33 (L) 04/04/2020 0354  ? CHOLHDL 4.3 04/04/2020 0354  ? VLDL 16 04/04/2020 0354  ? [place] 92 04/04/2020 0354  ? ? ? ?Risk Assessment/Calculations:   ? ?CHA2DS2-VASc Score = 6  ? This indicates a 9.7% annual risk of stroke. ?The patient's score is based upon: ?CHF History: 1 ?HTN History: 1 ?Diabetes History: 1 ?Stroke History: 0 ?Vascular Disease History: 0 ?Age Score: 2 ?Gender Score: 1 ?  ? ? ?     ? ?Physical Exam:   ? ?VS:  BP 133/65 (BP Location: Left Arm, Patient Position: Sitting)   Pulse 78   Ht 5\' 5"  (1.651 m)   Wt 114 lb (51.7 kg)   SpO2 90%   BMI 18.97 kg/m?    ? ?Wt Readings from Last 3 Encounte

## 2022-04-23 ENCOUNTER — Encounter: Payer: Self-pay | Admitting: Physician Assistant

## 2022-04-23 LAB — CBC
Hematocrit: 39.7 % (ref 34.0–46.6)
Hemoglobin: 12.6 g/dL (ref 11.1–15.9)
MCH: 27.9 pg (ref 26.6–33.0)
MCHC: 31.7 g/dL (ref 31.5–35.7)
MCV: 88 fL (ref 79–97)
Platelets: 144 10*3/uL — ABNORMAL LOW (ref 150–450)
RBC: 4.51 x10E6/uL (ref 3.77–5.28)
RDW: 15 % (ref 11.7–15.4)
WBC: 4.8 10*3/uL (ref 3.4–10.8)

## 2022-04-23 LAB — BASIC METABOLIC PANEL
BUN/Creatinine Ratio: 22 (ref 12–28)
BUN: 31 mg/dL — ABNORMAL HIGH (ref 8–27)
CO2: 30 mmol/L — ABNORMAL HIGH (ref 20–29)
Calcium: 9.9 mg/dL (ref 8.7–10.3)
Chloride: 95 mmol/L — ABNORMAL LOW (ref 96–106)
Creatinine, Ser: 1.4 mg/dL — ABNORMAL HIGH (ref 0.57–1.00)
Glucose: 99 mg/dL (ref 70–99)
Potassium: 4.5 mmol/L (ref 3.5–5.2)
Sodium: 138 mmol/L (ref 134–144)
eGFR: 37 mL/min/{1.73_m2} — ABNORMAL LOW (ref >59)

## 2022-04-23 NOTE — Progress Notes (Signed)
Normal red blood cell count. Stable renal function and electrolyte. Please check DPR, result likely will need to be reported to family member unless she has MyChart access, patient uses sign language

## 2022-05-20 ENCOUNTER — Inpatient Hospital Stay (HOSPITAL_BASED_OUTPATIENT_CLINIC_OR_DEPARTMENT_OTHER)
Admission: EM | Admit: 2022-05-20 | Discharge: 2022-05-27 | DRG: 291 | Disposition: A | Discharge status: Home Health | Payer: Medicare HMO | Attending: Family Medicine | Admitting: Family Medicine

## 2022-05-20 ENCOUNTER — Encounter (HOSPITAL_BASED_OUTPATIENT_CLINIC_OR_DEPARTMENT_OTHER): Payer: Self-pay | Admitting: Emergency Medicine

## 2022-05-20 ENCOUNTER — Other Ambulatory Visit: Payer: Self-pay

## 2022-05-20 ENCOUNTER — Emergency Department (HOSPITAL_BASED_OUTPATIENT_CLINIC_OR_DEPARTMENT_OTHER): Payer: Medicare HMO

## 2022-05-20 DIAGNOSIS — E039 Hypothyroidism, unspecified: Secondary | ICD-10-CM | POA: Diagnosis present

## 2022-05-20 DIAGNOSIS — I2729 Other secondary pulmonary hypertension: Secondary | ICD-10-CM | POA: Diagnosis present

## 2022-05-20 DIAGNOSIS — F32A Depression, unspecified: Secondary | ICD-10-CM | POA: Diagnosis present

## 2022-05-20 DIAGNOSIS — E871 Hypo-osmolality and hyponatremia: Secondary | ICD-10-CM | POA: Diagnosis present

## 2022-05-20 DIAGNOSIS — Z681 Body mass index (BMI) 19 or less, adult: Secondary | ICD-10-CM | POA: Diagnosis not present

## 2022-05-20 DIAGNOSIS — R079 Chest pain, unspecified: Secondary | ICD-10-CM | POA: Diagnosis not present

## 2022-05-20 DIAGNOSIS — I509 Heart failure, unspecified: Secondary | ICD-10-CM

## 2022-05-20 DIAGNOSIS — N1832 Chronic kidney disease, stage 3b: Secondary | ICD-10-CM | POA: Diagnosis present

## 2022-05-20 DIAGNOSIS — R0989 Other specified symptoms and signs involving the circulatory and respiratory systems: Secondary | ICD-10-CM | POA: Diagnosis not present

## 2022-05-20 DIAGNOSIS — E119 Type 2 diabetes mellitus without complications: Secondary | ICD-10-CM | POA: Diagnosis not present

## 2022-05-20 DIAGNOSIS — R17 Unspecified jaundice: Secondary | ICD-10-CM | POA: Diagnosis present

## 2022-05-20 DIAGNOSIS — R0602 Shortness of breath: Secondary | ICD-10-CM

## 2022-05-20 DIAGNOSIS — R0902 Hypoxemia: Secondary | ICD-10-CM

## 2022-05-20 DIAGNOSIS — J90 Pleural effusion, not elsewhere classified: Secondary | ICD-10-CM

## 2022-05-20 DIAGNOSIS — I4819 Other persistent atrial fibrillation: Secondary | ICD-10-CM | POA: Diagnosis present

## 2022-05-20 DIAGNOSIS — K219 Gastro-esophageal reflux disease without esophagitis: Secondary | ICD-10-CM | POA: Diagnosis present

## 2022-05-20 DIAGNOSIS — I5033 Acute on chronic diastolic (congestive) heart failure: Secondary | ICD-10-CM | POA: Diagnosis present

## 2022-05-20 DIAGNOSIS — J9611 Chronic respiratory failure with hypoxia: Secondary | ICD-10-CM

## 2022-05-20 DIAGNOSIS — E1151 Type 2 diabetes mellitus with diabetic peripheral angiopathy without gangrene: Secondary | ICD-10-CM | POA: Diagnosis present

## 2022-05-20 DIAGNOSIS — E1122 Type 2 diabetes mellitus with diabetic chronic kidney disease: Secondary | ICD-10-CM | POA: Diagnosis present

## 2022-05-20 DIAGNOSIS — I2781 Cor pulmonale (chronic): Secondary | ICD-10-CM | POA: Diagnosis present

## 2022-05-20 DIAGNOSIS — F419 Anxiety disorder, unspecified: Secondary | ICD-10-CM | POA: Diagnosis present

## 2022-05-20 DIAGNOSIS — H905 Unspecified sensorineural hearing loss: Secondary | ICD-10-CM | POA: Diagnosis not present

## 2022-05-20 DIAGNOSIS — N183 Chronic kidney disease, stage 3 unspecified: Secondary | ICD-10-CM | POA: Diagnosis not present

## 2022-05-20 DIAGNOSIS — E785 Hyperlipidemia, unspecified: Secondary | ICD-10-CM | POA: Diagnosis present

## 2022-05-20 DIAGNOSIS — I495 Sick sinus syndrome: Secondary | ICD-10-CM | POA: Diagnosis present

## 2022-05-20 DIAGNOSIS — N179 Acute kidney failure, unspecified: Secondary | ICD-10-CM | POA: Diagnosis present

## 2022-05-20 DIAGNOSIS — R54 Age-related physical debility: Secondary | ICD-10-CM | POA: Diagnosis present

## 2022-05-20 DIAGNOSIS — N189 Chronic kidney disease, unspecified: Secondary | ICD-10-CM | POA: Diagnosis not present

## 2022-05-20 DIAGNOSIS — E43 Unspecified severe protein-calorie malnutrition: Secondary | ICD-10-CM | POA: Diagnosis present

## 2022-05-20 DIAGNOSIS — Z950 Presence of cardiac pacemaker: Secondary | ICD-10-CM | POA: Diagnosis not present

## 2022-05-20 DIAGNOSIS — I130 Hypertensive heart and chronic kidney disease with heart failure and stage 1 through stage 4 chronic kidney disease, or unspecified chronic kidney disease: Secondary | ICD-10-CM | POA: Diagnosis not present

## 2022-05-20 DIAGNOSIS — G9341 Metabolic encephalopathy: Secondary | ICD-10-CM

## 2022-05-20 DIAGNOSIS — I3139 Other pericardial effusion (noninflammatory): Secondary | ICD-10-CM | POA: Diagnosis present

## 2022-05-20 DIAGNOSIS — I2510 Atherosclerotic heart disease of native coronary artery without angina pectoris: Secondary | ICD-10-CM | POA: Diagnosis present

## 2022-05-20 DIAGNOSIS — Z515 Encounter for palliative care: Secondary | ICD-10-CM | POA: Diagnosis not present

## 2022-05-20 DIAGNOSIS — Z79899 Other long term (current) drug therapy: Secondary | ICD-10-CM

## 2022-05-20 DIAGNOSIS — H409 Unspecified glaucoma: Secondary | ICD-10-CM | POA: Diagnosis present

## 2022-05-20 DIAGNOSIS — Z66 Do not resuscitate: Secondary | ICD-10-CM | POA: Diagnosis present

## 2022-05-20 DIAGNOSIS — M069 Rheumatoid arthritis, unspecified: Secondary | ICD-10-CM | POA: Diagnosis present

## 2022-05-20 DIAGNOSIS — D6959 Other secondary thrombocytopenia: Secondary | ICD-10-CM | POA: Diagnosis present

## 2022-05-20 DIAGNOSIS — J9621 Acute and chronic respiratory failure with hypoxia: Secondary | ICD-10-CM | POA: Diagnosis present

## 2022-05-20 DIAGNOSIS — H903 Sensorineural hearing loss, bilateral: Secondary | ICD-10-CM | POA: Diagnosis present

## 2022-05-20 DIAGNOSIS — J9601 Acute respiratory failure with hypoxia: Secondary | ICD-10-CM | POA: Diagnosis present

## 2022-05-20 DIAGNOSIS — Z7901 Long term (current) use of anticoagulants: Secondary | ICD-10-CM

## 2022-05-20 DIAGNOSIS — Z9981 Dependence on supplemental oxygen: Secondary | ICD-10-CM

## 2022-05-20 DIAGNOSIS — E7800 Pure hypercholesterolemia, unspecified: Secondary | ICD-10-CM | POA: Diagnosis present

## 2022-05-20 LAB — CBC WITH DIFFERENTIAL/PLATELET
Abs Immature Granulocytes: 0.04 10*3/uL (ref 0.00–0.07)
Basophils Absolute: 0 10*3/uL (ref 0.0–0.1)
Basophils Relative: 0 %
Eosinophils Absolute: 0.1 10*3/uL (ref 0.0–0.5)
Eosinophils Relative: 1 %
HCT: 39.5 % (ref 36.0–46.0)
Hemoglobin: 12.7 g/dL (ref 12.0–15.0)
Immature Granulocytes: 0 %
Lymphocytes Relative: 10 %
Lymphs Abs: 0.9 10*3/uL (ref 0.7–4.0)
MCH: 28.4 pg (ref 26.0–34.0)
MCHC: 32.2 g/dL (ref 30.0–36.0)
MCV: 88.4 fL (ref 80.0–100.0)
Monocytes Absolute: 1 10*3/uL (ref 0.1–1.0)
Monocytes Relative: 11 %
Neutro Abs: 6.9 10*3/uL (ref 1.7–7.7)
Neutrophils Relative %: 78 %
Platelets: 111 10*3/uL — ABNORMAL LOW (ref 150–400)
RBC: 4.47 MIL/uL (ref 3.87–5.11)
RDW: 18.3 % — ABNORMAL HIGH (ref 11.5–15.5)
Smear Review: DECREASED
WBC: 8.9 10*3/uL (ref 4.0–10.5)
nRBC: 0 % (ref 0.0–0.2)

## 2022-05-20 LAB — BRAIN NATRIURETIC PEPTIDE: B Natriuretic Peptide: 690.5 pg/mL — ABNORMAL HIGH (ref 0.0–100.0)

## 2022-05-20 LAB — TROPONIN I (HIGH SENSITIVITY)
Troponin I (High Sensitivity): 15 ng/L (ref <18)
Troponin I (High Sensitivity): 16 ng/L (ref <18)

## 2022-05-20 LAB — COMPREHENSIVE METABOLIC PANEL
ALT: 23 U/L (ref 0–44)
AST: 43 U/L — ABNORMAL HIGH (ref 15–41)
Albumin: 3.6 g/dL (ref 3.5–5.0)
Alkaline Phosphatase: 163 U/L — ABNORMAL HIGH (ref 38–126)
Anion gap: 11 (ref 5–15)
BUN: 38 mg/dL — ABNORMAL HIGH (ref 8–23)
CO2: 31 mmol/L (ref 22–32)
Calcium: 9 mg/dL (ref 8.9–10.3)
Chloride: 93 mmol/L — ABNORMAL LOW (ref 98–111)
Creatinine, Ser: 1.53 mg/dL — ABNORMAL HIGH (ref 0.44–1.00)
GFR, Estimated: 33 mL/min — ABNORMAL LOW (ref >60)
Glucose, Bld: 122 mg/dL — ABNORMAL HIGH (ref 70–99)
Potassium: 3.9 mmol/L (ref 3.5–5.1)
Sodium: 135 mmol/L (ref 135–145)
Total Bilirubin: 2 mg/dL — ABNORMAL HIGH (ref 0.3–1.2)
Total Protein: 7.4 g/dL (ref 6.5–8.1)

## 2022-05-20 LAB — LIPASE, BLOOD: Lipase: 36 U/L (ref 11–51)

## 2022-05-20 LAB — TSH: TSH: 0.296 u[IU]/mL — ABNORMAL LOW (ref 0.350–4.500)

## 2022-05-20 LAB — GLUCOSE, CAPILLARY: Glucose-Capillary: 233 mg/dL — ABNORMAL HIGH (ref 70–99)

## 2022-05-20 LAB — MAGNESIUM: Magnesium: 2.1 mg/dL (ref 1.7–2.4)

## 2022-05-20 MED ORDER — IOHEXOL 350 MG/ML SOLN
60.0000 mL | Freq: Once | INTRAVENOUS | Status: AC | PRN
  Administered 2022-05-20: 60 mL via INTRAVENOUS

## 2022-05-20 MED ORDER — FENTANYL CITRATE PF 50 MCG/ML IJ SOSY
50.0000 ug | PREFILLED_SYRINGE | Freq: Once | INTRAMUSCULAR | Status: DC
Start: 2022-05-20 — End: 2022-05-21
  Filled 2022-05-20: qty 1

## 2022-05-20 MED ORDER — ONDANSETRON HCL 4 MG/2ML IJ SOLN
4.0000 mg | Freq: Once | INTRAMUSCULAR | Status: AC
  Administered 2022-05-20: 4 mg via INTRAVENOUS
  Filled 2022-05-20: qty 2

## 2022-05-20 MED ORDER — FUROSEMIDE 10 MG/ML IJ SOLN: 60.0000 mg | Freq: Once | INTRAMUSCULAR | Status: DC

## 2022-05-20 NOTE — ED Notes (Signed)
Pt family called out due to pt needing to use the restroom. Potty chair and pure wick was taken into pt room. Pt family states the last couple time pt has used the pure wick pt was unable to urinate. Due to pt O2 stats dropping the pt O2 is left on her and the O2 sensor is left on pt. Pt is assisted to standing position and assisted on potty chair. Pt remains on O2 by N/C at 6 LPM. Pt is assisted back to bed, given a warm wash rag with soap and water so she can wash her hands. Pt has remained hooked up to the cardiac monitor the entire time.

## 2022-05-20 NOTE — ED Provider Notes (Signed)
[place] EMERGENCY DEPARTMENT Provider Note   CSN: [id number] Arrival date & time: 05/20/22  1538     History  Chief Complaint  Patient presents with   Chest Pain    Veronica Young is a 88 y.o. female.  The history is provided by the patient, a relative and medical records. No language interpreter was used (family provided ASL interpretation).  Chest Pain Pain location:  Substernal area Pain quality: radiating and sharp   Pain radiates to:  Upper back Pain severity:  Severe Onset quality:  Sudden Duration:  2 days Timing:  Constant Progression:  Unchanged Chronicity:  New Context: breathing   Relieved by:  Nothing Worsened by:  Deep breathing Ineffective treatments:  None tried Associated symptoms: back pain, nausea, shortness of breath and vomiting   Associated symptoms: no abdominal pain, no altered mental status, no cough, no diaphoresis, no dizziness, no fatigue, no fever, no headache, no lower extremity edema and no palpitations       Home Medications Prior to Admission medications   Medication Sig Start Date End Date Taking? Authorizing Provider  acetaminophen (TYLENOL) 500 MG tablet Take 1,000 mg by mouth every 6 (six) hours as needed for moderate pain. Patient not taking: Reported on 02/01/2022    [provider]  apixaban (ELIQUIS) 2.5 MG TABS tablet Take 1 tablet (2.5 mg total) by mouth 2 (two) times daily. 11/09/21   [name], Peter M, MD  atropine 1 % ophthalmic solution Place 1 drop into the left eye at bedtime.     [provider]  brimonidine (ALPHAGAN) 0.2 % ophthalmic solution Place 1 drop into both eyes 2 (two) times daily. 05/20/17   [provider]  calcium-vitamin D (OSCAL WITH D) 500-200 MG-UNIT per tablet Take 1 tablet by mouth daily with breakfast.    [provider]  cholecalciferol (VITAMIN D) 1000 UNITS tablet Take 1,000 Units by mouth daily.    [provider]  escitalopram (LEXAPRO) 10 MG  tablet Take 10 mg by mouth daily. 11/19/21   [provider]  feeding supplement (ENSURE ENLIVE / ENSURE PLUS) LIQD Take 237 mLs by mouth 3 (three) times daily between meals. Patient taking differently: Take 237 mLs by mouth 2 (two) times daily between meals. 01/12/22   Pahwani, Michell Heinrich, MD  fluorometholone (FML) 0.1 % ophthalmic suspension Place 1 drop into the left eye 4 (four) times daily. Patient not taking: Reported on 04/22/2022 01/17/20   [provider]  furosemide (LASIX) 40 MG tablet Take 2 tablets (80 mg total) by mouth daily. 01/18/22   Arrien, Jimmy Picket, MD  gabapentin (NEURONTIN) 100 MG capsule Take 100 mg by mouth at bedtime. 10/24/19   [provider]  gabapentin (NEURONTIN) 100 MG capsule Take 1 capsule by mouth daily. 02/01/22   [provider]  latanoprost (XALATAN) 0.005 % ophthalmic solution Place 1 drop into both eyes in the morning and at bedtime.  Patient not taking: Reported on 04/22/2022 11/25/15   [provider]  levothyroxine (SYNTHROID, LEVOTHROID) 112 MCG tablet Take 112 mcg by mouth daily before breakfast.  Patient not taking: Reported on 04/22/2022 03/31/14   [provider]  metoprolol tartrate (LOPRESSOR) 25 MG tablet TAKE 1 TABLET (25 MG TOTAL) BY MOUTH 2 (TWO) TIMES DAILY. 02/04/22   [name], Peter M, MD  Multiple Vitamin (MULTIVITAMIN WITH MINERALS) TABS tablet Take 1 tablet by mouth daily. 01/12/22   Pahwani, Michell Heinrich, MD  OXYGEN Inhale into the lungs. 2 liters  as needed    [provider]  potassium chloride SA (KLOR-CON M) 20 MEQ tablet Take 1 tablet (20 mEq total) by mouth daily. 02/01/22   [name], Peter M, MD  RHOPRESSA 0.02 % SOLN Place 1 drop into the left eye at bedtime. 10/28/19   [provider]      Allergies    Amlodipine and Benicar [olmesartan medoxomil]    Review of Systems   Review of Systems  Constitutional:  Negative for chills, diaphoresis, fatigue and fever.  HENT:  Negative  for congestion.   Respiratory:  Positive for shortness of breath. Negative for cough, chest tightness and wheezing.   Cardiovascular:  Positive for chest pain. Negative for palpitations.  Gastrointestinal:  Positive for nausea and vomiting. Negative for abdominal pain, constipation and diarrhea.  Genitourinary:  Negative for dysuria and flank pain.  Musculoskeletal:  Positive for back pain. Negative for neck pain and neck stiffness.  Skin:  Negative for rash and wound.  Neurological:  Negative for dizziness, light-headedness and headaches.  Psychiatric/Behavioral:  Negative for agitation and confusion.   All other systems reviewed and are negative.  Physical Exam Updated Vital Signs BP 120/67   Pulse 83   Temp 98.4 F (36.9 C)   Resp 19   Ht 5\' 6"  (1.676 m)   Wt 51.3 kg   SpO2 95%   BMI 18.24 kg/m  Physical Exam Vitals and nursing note reviewed.  Constitutional:      General: She is not in acute distress.    Appearance: She is well-developed. She is not ill-appearing, toxic-appearing or diaphoretic.  HENT:     Head: Normocephalic and atraumatic.  Eyes:     Conjunctiva/sclera: Conjunctivae normal.     Pupils: Pupils are equal, round, and reactive to light.  Cardiovascular:     Rate and Rhythm: Normal rate and regular rhythm.     Heart sounds: Murmur heard.  Pulmonary:     Effort: Pulmonary effort is normal. Tachypnea present. No respiratory distress.     Breath sounds: Rales present. No wheezing or rhonchi.  Chest:     Chest wall: No tenderness.  Abdominal:     Palpations: Abdomen is soft.     Tenderness: There is no abdominal tenderness. There is no guarding or rebound.  Musculoskeletal:        General: No swelling.     Cervical back: Neck supple.     Right lower leg: No tenderness. No edema.     Left lower leg: No tenderness. No edema.  Skin:    General: Skin is warm and dry.     Capillary Refill: Capillary refill takes less than 2 seconds.  Neurological:      General: No focal deficit present.     Mental Status: She is alert.  Psychiatric:        Mood and Affect: Mood normal.    ED Results / Procedures / Treatments   Labs (all labs ordered are listed, but only abnormal results are displayed) Labs Reviewed  CBC WITH DIFFERENTIAL/PLATELET - Abnormal; Notable for the following components:      Result Value   RDW 18.3 (*)    Platelets 111 (*)    All other components within normal limits  COMPREHENSIVE METABOLIC PANEL - Abnormal; Notable for the following components:   Chloride 93 (*)    Glucose, Bld 122 (*)    BUN 38 (*)    Creatinine, Ser 1.53 (*)    AST 43 (*)  Alkaline Phosphatase 163 (*)    Total Bilirubin 2.0 (*)    GFR, Estimated 33 (*)    All other components within normal limits  BRAIN NATRIURETIC PEPTIDE - Abnormal; Notable for the following components:   B Natriuretic Peptide 690.5 (*)    All other components within normal limits  LIPASE, BLOOD  MAGNESIUM  TSH  TROPONIN I (HIGH SENSITIVITY)  TROPONIN I (HIGH SENSITIVITY)    EKG EKG Interpretation  Date/Time:  Monday May 20 2022 16:10:23 EDT Ventricular Rate:  89 PR Interval:    QRS Duration: 72 QT Interval:  352 QTC Calculation: 428 R Axis:   95 Text Interpretation: Atrial fibrillation Rightward axis Septal infarct , age undetermined ST & T wave abnormality, consider inferolateral ischemia Abnormal ECG When compared with ECG of 13-Jan-2022 19:52, PREVIOUS ECG IS PRESENT when compared to prior, less pacing and still afib. No STEMI Confirmed by Antony Blackbird [phone]) on 05/20/2022 4:50:54 PM  Radiology DG Chest Portable 1 View  Result Date: 05/20/2022 CLINICAL DATA:  Pleuritic sharp pain to back. EXAM: PORTABLE CHEST 1 VIEW pleuritic sharp stabbing pain to back. COMPARISON:  AP chest 01/13/2022 and 11/13/2020; CT chest 11/14/2020 FINDINGS: Left chest wall cardiac pacer is seen with leads overlying right atrium and right ventricle. Cardiac silhouette is again mildly  to moderately enlarged. Mediastinal contours are within normal limits. There is unchanged mild bilateral interstitial thickening, greatest within the lung bases. Cystic lucencies are again seen within the upper lungs suggesting cystic emphysematous change. No definite pleural effusion. No pneumothorax. Mild levocurvature of the thoracic spine with mild multilevel degenerative disc changes. IMPRESSION: No significant change from 01/13/2022. Cardiomegaly with mild interstitial pulmonary edema. Electronically Signed   By: Yvonne Kendall M.D.   On: 05/20/2022 18:15   CT Angio Chest/Abd/Pel for Dissection W and/or Wo Contrast  Result Date: 05/20/2022 CLINICAL DATA:  Chest and back pain. EXAM: CT ANGIOGRAPHY CHEST, ABDOMEN AND PELVIS TECHNIQUE: Non-contrast CT of the chest was initially obtained. Multidetector CT imaging through the chest, abdomen and pelvis was performed using the standard protocol during bolus administration of intravenous contrast. Multiplanar reconstructed images and MIPs were obtained and reviewed to evaluate the vascular anatomy. RADIATION DOSE REDUCTION: This exam was performed according to the departmental dose-optimization program which includes automated exposure control, adjustment of the mA and/or kV according to patient size and/or use of iterative reconstruction technique. CONTRAST:  72mL OMNIPAQUE IOHEXOL 350 MG/ML SOLN COMPARISON:  CT chest 11/14/2020 FINDINGS: CTA CHEST FINDINGS Cardiovascular: Heart is moderately enlarged, unchanged. There is a new small pericardial effusion. There is adequate opacification of the thoracic aorta. There is no evidence for aortic dissection or aneurysm. There is calcified atherosclerotic disease throughout the aorta. Left-sided pacemaker is present with leads in the right atrium and right ventricle. Mediastinum/Nodes: No enlarged mediastinal, hilar, or axillary lymph nodes. Thyroid gland, trachea, and esophagus demonstrate no significant findings.  Lungs/Pleura: There are trace bilateral pleural effusions scratch at trace right and small left pleural effusions are present. There is a small amount of atelectasis in the bilateral lower lobes, right middle lobe and lingula. There is a calcified granuloma in the right lung apex, unchanged. Pneumothorax. Musculoskeletal: No acute fracture. Review of the MIP images confirms the above findings. CTA ABDOMEN AND PELVIS FINDINGS VASCULAR Aorta: Normal caliber aorta without aneurysm, dissection, vasculitis or significant stenosis. There are atherosclerotic calcifications of the aorta. Celiac: Patent without evidence of aneurysm, dissection, vasculitis or significant stenosis. SMA: Patent without evidence of aneurysm, dissection, vasculitis or  significant stenosis. Renals: There is moderate stenosis at the origin of the right renal artery. Left renal artery is within normal limits. IMA: Patent without evidence of aneurysm, dissection, vasculitis or significant stenosis. Inflow: Patent without evidence of aneurysm, dissection, vasculitis or significant stenosis. Veins: No obvious venous abnormality within the limitations of this arterial phase study. Review of the MIP images confirms the above findings. NON-VASCULAR Hepatobiliary: No focal liver abnormality is seen. Status post cholecystectomy. No biliary dilatation. Pancreas: Unremarkable. No pancreatic ductal dilatation or surrounding inflammatory changes. Spleen: Normal in size without focal abnormality. Adrenals/Urinary Tract: Renal cortical hypodensities are seen in both kidneys which are too small to characterize, likely cysts. There is no hydronephrosis or perinephric fluid. The adrenal glands and bladder are within normal limits. Stomach/Bowel: Stomach is within normal limits. No evidence of bowel wall thickening, distention, or inflammatory changes. Appendix is not seen. There is sigmoid colon diverticulosis without evidence for acute diverticulitis. Lymphatic: No  enlarged lymph nodes are seen. Reproductive: Uterus and bilateral adnexa are unremarkable. Other: No abdominal wall hernia or abnormality. Small amount of free fluid in the pelvis. Musculoskeletal: There is scoliosis and degenerative change throughout the lumbar spine. Review of the MIP images confirms the above findings. IMPRESSION: 1. No evidence for aortic dissection or aneurysm. 2. Moderate cardiomegaly with small pericardial effusion. 3. Small bilateral pleural effusions. 4. Small amount of free fluid in the pelvis of uncertain etiology. 5. Sigmoid colon diverticulosis. 6.  Aortic Atherosclerosis (ICD10-I70.0). Electronically Signed   By: Ronney Asters M.D.   On: 05/20/2022 19:37    Procedures Procedures    CRITICAL CARE Performed by: Gwenyth Allegra Esthefany Herrig Total critical care time: 35 minutes Critical care time was exclusive of separately billable procedures and treating other patients. Critical care was necessary to treat or prevent imminent or life-threatening deterioration. Critical care was time spent personally by me on the following activities: development of treatment plan with patient and/or surrogate as well as nursing, discussions with consultants, evaluation of patient's response to treatment, examination of patient, obtaining history from patient or surrogate, ordering and performing treatments and interventions, ordering and review of laboratory studies, ordering and review of radiographic studies, pulse oximetry and re-evaluation of patient's condition.   Medications Ordered in ED Medications  fentaNYL (SUBLIMAZE) injection 50 mcg (50 mcg Intravenous Not Given 05/20/22 1755)  ondansetron (ZOFRAN) injection 4 mg (4 mg Intravenous Given 05/20/22 1755)  iohexol (OMNIPAQUE) 350 MG/ML injection 60 mL (60 mLs Intravenous Contrast Given 05/20/22 1855)    ED Course/ Medical Decision Making/ A&P                           Medical Decision Making Amount and/or Complexity of Data  Reviewed Labs: ordered. Radiology: ordered.  Risk Prescription drug management. Decision regarding hospitalization.    HARUMI YAMIN is a 88 y.o. female with a past medical history significant for GERD, peripheral arterial disease, CHF, hypertension, sick sinus syndrome with pacemaker, paroxysmal atrial fibrillation on Eliquis therapy, deafness needing sign language interpretation, and hypothyroidism who presents with 2 days of sharp stabbing pleuritic chest discomfort in her central chest with associated shortness of breath and hypoxia.  According to patient, since yesterday she had onset of pain that is sharp and stabbing in her central chest that goes straight to her back.  It does not go down into her abdomen.  She reports it is pleuritic and she has been very short of breath.  She reports  an episode of nausea and vomiting but denies lightheadedness or syncope.  She reports she still has energy.  She says this does not feel exactly like when she had fluid overload in the past.  She denies new leg pain or leg swelling and denies history of DVT or PE and she does take anticoagulation medications for A-fib.  She denies palpitations.  She denies any diaphoresis.  Denies any trauma.  He reports this is new different pain.    Family reports that patient has used 2 L nasal cannula at home at times but when she was on that on arrival here she was satting 84% on 2 L.  She is now been increased to 4 L which is new for her.  Of note, we offered sign language interpretation via iPad service however patient's family would like to interpret for her.  On exam, lungs had rales and were diminished bilaterally but were symmetric.  No rhonchi appreciated.  Chest was nontender and I cannot reproduce her discomfort.  Abdomen was nontender.  I do not hear murmur.  She had intact pulses in both upper and lower extremities with intact sensation and strength.  Legs are nontender and nonedematous.  No focal neurologic  deficits initially.  Given the patient's sharp stabbing pain in her chest that goes straight to her back and her history of peripheral arterial disease, I do feel need to rule out an aortic cause such as aneurysm or dissection.  A CTA would likely also help rule out a large pulmonary embolism as well however with the pain going to the back I do not feel comfortable with just a PE study.  We will get a chest x-ray first to look for pneumothorax given the pleuritic discomfort and her shortness of breath.  We will get troponin and other labs.  Her EKG did not show STEMI but does show some ST depressions in 2, 3, and aVF.  Based on the patient's worsened hypoxia, chest discomfort with EKG abnormalities, and shortness of breath I do anticipate she will need admission work-up is completed.  8:16 PM Work-up began to return.  Troponin negative x2.  CBC and metabolic panel showed mild elevation in creatinine elevation 1.53.  Lipase not elevated.  Magnesium normal.  BNP is elevated at 690 although it appears it has been elevated like this in the past.  CT scan does not show evidence of dissection or PE but does show evidence of pleural effusions, small pericardial effusion, and pulmonary edema.  Patient is now needing 7 L of oxygen to maintain oxygen saturations which is much higher than her baseline.  Do not feel she is safe to discharge home with increased oxygen requirement evidence of fluid overload.  We will call for admission for likely echo given the pericardial effusion and for safe diuresis in the setting of elevated kidney function.    Medicine requested Lasix which we ordered.  She will be admitted for further management         Final Clinical Impression(s) / ED Diagnoses Final diagnoses:  Nonspecific chest pain  Shortness of breath  Hypoxia  Pleural effusion  Pericardial effusion    Clinical Impression: 1. Nonspecific chest pain   2. Shortness of breath   3. Hypoxia   4. Pleural  effusion   5. Pericardial effusion     Disposition: Admit  This note was prepared with assistance of Dragon voice recognition software. Occasional wrong-word or sound-a-like substitutions may have occurred due to the inherent  limitations of voice recognition software.     Vinisha Faxon, Gwenyth Allegra, MD 05/20/22 2111

## 2022-05-20 NOTE — ED Notes (Addendum)
Pt resting, no complaints, adjusted patient in bed

## 2022-05-20 NOTE — ED Notes (Signed)
Pt o2sat 84% on 2L [place]. Pt increased to 4L [place]. O2sat now mid-90s. RT made aware.

## 2022-05-20 NOTE — ED Triage Notes (Signed)
Patient c/o chest wall pain since last night, pain with movement and when taking a deep breath. History of CHF.

## 2022-05-20 NOTE — ED Notes (Addendum)
O2 at home qHS and with exertion, SpO2 90-92%. Placed on 2lpm [place] in triage. 4:16 Charted in error, disregard

## 2022-05-20 NOTE — ED Notes (Signed)
[facility]

## 2022-05-20 NOTE — ED Notes (Signed)
MD aware of BP 93/55 (66)   Hold lasix for now and will re-assess when BP improves per Dr. Sherry Ruffing

## 2022-05-20 NOTE — ED Notes (Signed)
Patient transported to CT 

## 2022-05-20 NOTE — ED Notes (Signed)
X-ray at bedside

## 2022-05-21 ENCOUNTER — Inpatient Hospital Stay (HOSPITAL_COMMUNITY): Payer: Medicare HMO

## 2022-05-21 DIAGNOSIS — I5033 Acute on chronic diastolic (congestive) heart failure: Secondary | ICD-10-CM

## 2022-05-21 DIAGNOSIS — I3139 Other pericardial effusion (noninflammatory): Secondary | ICD-10-CM

## 2022-05-21 DIAGNOSIS — H905 Unspecified sensorineural hearing loss: Secondary | ICD-10-CM | POA: Diagnosis present

## 2022-05-21 LAB — COMPREHENSIVE METABOLIC PANEL
ALT: 22 U/L (ref 0–44)
AST: 38 U/L (ref 15–41)
Albumin: 3.1 g/dL — ABNORMAL LOW (ref 3.5–5.0)
Alkaline Phosphatase: 149 U/L — ABNORMAL HIGH (ref 38–126)
Anion gap: 9 (ref 5–15)
BUN: 40 mg/dL — ABNORMAL HIGH (ref 8–23)
CO2: 31 mmol/L (ref 22–32)
Calcium: 9 mg/dL (ref 8.9–10.3)
Chloride: 95 mmol/L — ABNORMAL LOW (ref 98–111)
Creatinine, Ser: 1.76 mg/dL — ABNORMAL HIGH (ref 0.44–1.00)
GFR, Estimated: 28 mL/min — ABNORMAL LOW (ref >60)
Glucose, Bld: 110 mg/dL — ABNORMAL HIGH (ref 70–99)
Potassium: 3.8 mmol/L (ref 3.5–5.1)
Sodium: 135 mmol/L (ref 135–145)
Total Bilirubin: 1.9 mg/dL — ABNORMAL HIGH (ref 0.3–1.2)
Total Protein: 6.7 g/dL (ref 6.5–8.1)

## 2022-05-21 LAB — HEMOGLOBIN A1C
Hgb A1c MFr Bld: 6.1 % — ABNORMAL HIGH (ref 4.8–5.6)
Mean Plasma Glucose: 128.37 mg/dL

## 2022-05-21 LAB — GLUCOSE, CAPILLARY
Glucose-Capillary: 105 mg/dL — ABNORMAL HIGH (ref 70–99)
Glucose-Capillary: 147 mg/dL — ABNORMAL HIGH (ref 70–99)
Glucose-Capillary: 132 mg/dL — ABNORMAL HIGH (ref 70–99)
Glucose-Capillary: 155 mg/dL — ABNORMAL HIGH (ref 70–99)

## 2022-05-21 LAB — ECHOCARDIOGRAM COMPLETE
AR max vel: 3.34 cm2
AV Area VTI: 3.46 cm2
AV Area mean vel: 3.24 cm2
AV Mean grad: 2.3 mmHg
AV Peak grad: 4.4 mmHg
Ao pk vel: 1.05 m/s
Calc EF: 55.9 %
Height: 65 in
S' Lateral: 1.8 cm
Single Plane A2C EF: 49.8 %
Single Plane A4C EF: 59.4 %
Weight: 1820.8 oz

## 2022-05-21 LAB — CBC
HCT: 37.2 % (ref 36.0–46.0)
Hemoglobin: 12.1 g/dL (ref 12.0–15.0)
MCH: 28.4 pg (ref 26.0–34.0)
MCHC: 32.5 g/dL (ref 30.0–36.0)
MCV: 87.3 fL (ref 80.0–100.0)
Platelets: 102 10*3/uL — ABNORMAL LOW (ref 150–400)
RBC: 4.26 MIL/uL (ref 3.87–5.11)
RDW: 18.3 % — ABNORMAL HIGH (ref 11.5–15.5)
WBC: 9.1 10*3/uL (ref 4.0–10.5)
nRBC: 0 % (ref 0.0–0.2)

## 2022-05-21 MED ORDER — APIXABAN 2.5 MG PO TABS
2.5000 mg | ORAL_TABLET | Freq: Two times a day (BID) | ORAL | Status: DC
  Administered 2022-05-21 – 2022-05-27 (×13): 2.5 mg via ORAL
  Filled 2022-05-21 (×13): qty 1

## 2022-05-21 MED ORDER — GABAPENTIN 100 MG PO CAPS
100.0000 mg | ORAL_CAPSULE | Freq: Every day | ORAL | Status: DC
  Administered 2022-05-21 – 2022-05-26 (×2): 100 mg via ORAL
  Filled 2022-05-21 (×5): qty 1

## 2022-05-21 MED ORDER — BRIMONIDINE TARTRATE 0.15 % OP SOLN
1.0000 [drp] | Freq: Two times a day (BID) | OPHTHALMIC | Status: DC
  Administered 2022-05-21 – 2022-05-27 (×13): 1 [drp] via OPHTHALMIC
  Filled 2022-05-21: qty 5

## 2022-05-21 MED ORDER — MIDODRINE HCL 5 MG PO TABS
10.0000 mg | ORAL_TABLET | Freq: Two times a day (BID) | ORAL | Status: DC
  Administered 2022-05-21 – 2022-05-25 (×9): 10 mg via ORAL
  Filled 2022-05-21 (×10): qty 2

## 2022-05-21 MED ORDER — ACETAMINOPHEN 650 MG RE SUPP: 650.0000 mg | Freq: Four times a day (QID) | RECTAL | Status: DC | PRN

## 2022-05-21 MED ORDER — ESCITALOPRAM OXALATE 10 MG PO TABS
10.0000 mg | ORAL_TABLET | Freq: Every day | ORAL | Status: DC
  Administered 2022-05-21 – 2022-05-27 (×7): 10 mg via ORAL
  Filled 2022-05-21 (×7): qty 1

## 2022-05-21 MED ORDER — ENSURE ENLIVE PO LIQD
237.0000 mL | Freq: Three times a day (TID) | ORAL | Status: DC
  Administered 2022-05-21 – 2022-05-26 (×5): 237 mL via ORAL

## 2022-05-21 MED ORDER — GABAPENTIN 100 MG PO CAPS: 100.0000 mg | ORAL_CAPSULE | Freq: Two times a day (BID) | ORAL | Status: DC

## 2022-05-21 MED ORDER — SODIUM CHLORIDE 0.9 % IV BOLUS
250.0000 mL | Freq: Once | INTRAVENOUS | Status: AC
  Administered 2022-05-21: 250 mL via INTRAVENOUS

## 2022-05-21 MED ORDER — FUROSEMIDE 10 MG/ML IJ SOLN
40.0000 mg | Freq: Two times a day (BID) | INTRAMUSCULAR | Status: DC
Start: 2022-05-21 — End: 2022-05-21
  Administered 2022-05-21: 40 mg via INTRAVENOUS
  Filled 2022-05-21: qty 4

## 2022-05-21 MED ORDER — LEVOTHYROXINE SODIUM 100 MCG PO TABS
100.0000 ug | ORAL_TABLET | Freq: Every day | ORAL | Status: DC
  Administered 2022-05-22 – 2022-05-27 (×6): 100 ug via ORAL
  Filled 2022-05-21 (×6): qty 1

## 2022-05-21 MED ORDER — INSULIN ASPART 100 UNIT/ML IJ SOLN
0.0000 [IU] | Freq: Three times a day (TID) | INTRAMUSCULAR | Status: DC
  Administered 2022-05-21: 1 [IU] via SUBCUTANEOUS
  Administered 2022-05-22 – 2022-05-23 (×2): 2 [IU] via SUBCUTANEOUS
  Administered 2022-05-23: 1 [IU] via SUBCUTANEOUS
  Administered 2022-05-24 – 2022-05-25 (×2): 2 [IU] via SUBCUTANEOUS

## 2022-05-21 MED ORDER — ACETAMINOPHEN 325 MG PO TABS
650.0000 mg | ORAL_TABLET | Freq: Four times a day (QID) | ORAL | Status: DC | PRN
  Administered 2022-05-21 – 2022-05-27 (×3): 650 mg via ORAL
  Filled 2022-05-21 (×3): qty 2

## 2022-05-21 MED ORDER — INSULIN ASPART 100 UNIT/ML IJ SOLN: 0.0000 [IU] | Freq: Every day | INTRAMUSCULAR | Status: DC

## 2022-05-21 MED ORDER — METOPROLOL TARTRATE 25 MG PO TABS: 25.0000 mg | ORAL_TABLET | Freq: Two times a day (BID) | ORAL | Status: DC

## 2022-05-21 MED ORDER — LEVOTHYROXINE SODIUM 112 MCG PO TABS: 112.0000 ug | ORAL_TABLET | Freq: Every day | ORAL | Status: DC

## 2022-05-21 NOTE — Progress Notes (Signed)
   05/21/22 0714  Assess: MEWS Score  Temp 98.1 F (36.7 C)  BP (!) 89/40  ECG Heart Rate 100  Resp (!) 28  Level of Consciousness Responds to Voice  SpO2 (!) 82 %  O2 Device Nasal Cannula  Patient Activity (if Appropriate) In bed  O2 Flow Rate (L/min) 3 L/min  Assess: MEWS Score  MEWS Temp 0  MEWS Systolic 1  MEWS Pulse 0  MEWS RR 2  MEWS LOC 1  MEWS Score 4  MEWS Score Color Red  Assess: if the MEWS score is Yellow or Red  Were vital signs taken at a resting state? Yes  Focused Assessment Change from prior assessment (see assessment flowsheet)  Early Detection of Sepsis Score *See Row Information* Low  MEWS guidelines implemented *See Row Information* No, vital signs rechecked  Treat  MEWS Interventions Escalated (See documentation below)  Pain Scale 0-10  Pain Score 0  Take Vital Signs  Increase Vital Sign Frequency  Red: Q 1hr X 4 then Q 4hr X 4, if remains red, continue Q 4hrs  Notify: Charge Nurse/RN  Name of Charge Nurse/RN Notified Carroll Kinds, RN  Date Charge Nurse/RN Notified 05/21/22  Time Charge Nurse/RN Notified 0915  Notify: Provider  Provider Name/Title Dr Broadus John  Date Provider Notified 05/21/22  Time Provider Notified [phone]  Method of Notification Face-to-face  Notification Reason Change in status  Provider response At bedside  Date of Provider Response 05/21/22  Time of Provider Response [phone]  Notify: Rapid Response  Name of Rapid Response RN Notified n/a  Document  Patient Outcome Stabilized after interventions

## 2022-05-21 NOTE — Consult Note (Addendum)
Cardiology Consultation:   Patient ID: Veronica Young MRN: [id number]; DOB: 08/10/1933  Admit date: 05/20/2022 Date of Consult: 05/21/2022  PCP:  Katherina Mires, MD   [facility] Providers Cardiologist:  Peter [name], MD  Electrophysiologist:  Cristopher Peru, MD    Patient Profile:   Veronica Young is a 88 y.o. female with a hx of congenital deafness, chronic diastolic CHF, persistent atrial fibrillation on eliquis, paroxysmal atrial tachycardia, sick sinus syndrome s/p [employer] PPM, CAD, PAD, HTN, HLD, type 2 DM, hypothyroidism CKD stage IIIb who is being seen 05/21/2022 for the evaluation of CHF, chest pain, atrial fibrillation at the request of Dr. Broadus John.  History of Present Illness:   Veronica Young is an 88 year old female with above medical history who is followed by Dr. [name]. Per chart review, patient underwent cardiac catheterization on 09/28/2004 that showed moderate nonobstructive CAD. Later had a stress test in 2014 that was a normal study. In 2015, patient developed symptomatic bradycardia and had a dual chamber pacemaker implanted on 07/27/2014.   Patient was recently admitted to the hospital from 01/08/22-01/12/22 for treatment of acute hypovolemic AKI, abdominal pain, nausea, and vomiting. Patient was given IV fluids with improvement of AKI, and was discharged. Patient presented to the ED on 01/13/22, one day after being discharged complaining of weakness, leg swelling. Was admitted to the hospital for treatment of acute on chronic diastolic heart failure. Echocardiogram on 01/14/22 showed EF 60-65%, mild LVH, severe TR (could not rule out that pacing lead was tenting one of the tricuspid leaflets), severe pulmonary HTN. Considered doing RHC that admission, although ultimately decided against it due to patient's age, frailty, and reduced kidney function. As patient had findings consistent with restrictive disease, underwent a Tc28m-PYP scan on 01/25/22 that was a normal study and not  suggestive of amyloidosis. Patient's PPM was last interrogated on 02/02/22 and showed known permanent afib, normal device function.   Patient was last seen by cardiology on 04/22/22 and was stable at that appointment.   Patient presented to the ER on 5/29 complaining of chest wall pain since the night before. Pain was worse with movement and when taking a deep breath. Patient usually is on 2 L oxygen via nasal cannula at home, required 7 L in the ED. Labs in the ED showed Na 135, K 3.9, creatinine 1.53, WBC 8.9, hemoglobin 12.7, platelets 111. BNP elevated to 690.5. hsTn  15>>16.  CXR showed cardiomegaly with mild interstitial pulmonary edema.  EKG showed atrial fibrillation, HR 89 BPM, inverted T waves in leads V5, V6.  CT anigio chest/abdomen/pelvis showed no evidence of aortic dissection or aneurysm, no PE, moderate cardiomegaly with small pericardial effusions, small bilateral pleural effusions.   Interview was conducted with the help of an ASL interpreter. Patient reports that she has chest tightness that is worse with deep inhalation. Denies any positional chest pain. Denies chest pain on exertion. Denies SOB, orthopnea. However, does have an increased oxygen requirement as she is usually on 2L at home, required 7 L in the ER. Now on 4L. Denies any recent cough, n/v/d, body aches.   Past Medical History:  Diagnosis Date   Carotid bruit    CHF (congestive heart failure) (HCC)    Deafness    Diabetes mellitus    type 2   Diastolic heart failure (HCC)    EF is 60%   GERD (gastroesophageal reflux disease)    Glaucoma    Hypercholesterolemia    Hypertension  Hypothyroidism    Normal cardiac stress test January 2014   PAD (peripheral artery disease) ([place])    a. known bilateral popliteal occlusions with conservative management favored.   PAT (paroxysmal atrial tachycardia) (HCC)    Sick sinus syndrome [facility])    a. s/p PPM placement 07/2014    Past Surgical History:  Procedure  Laterality Date   cholycystectomy     left breast cyst removed     PACEMAKER INSERTION  07-26-2014   MDT ADDRL1 pacemaker implanted by Dr Lovena Le for [place] N/A 07/26/2014   Procedure: PERMANENT PACEMAKER INSERTION;  Surgeon: Evans Lance, MD;  Location: [facility] CATH LAB;  Service: Cardiovascular;  Laterality: N/A;   TOTAL Young ARTHROPLASTY     right     Home Medications:  Prior to Admission medications   Medication Sig Start Date End Date Taking? Authorizing Provider  acetaminophen (TYLENOL) 500 MG tablet Take 1,000 mg by mouth every 6 (six) hours as needed for moderate pain. Patient not taking: Reported on 02/01/2022    [provider]  apixaban (ELIQUIS) 2.5 MG TABS tablet Take 1 tablet (2.5 mg total) by mouth 2 (two) times daily. 11/09/21   [name], Peter M, MD  atropine 1 % ophthalmic solution Place 1 drop into the left eye at bedtime.     [provider]  brimonidine (ALPHAGAN) 0.2 % ophthalmic solution Place 1 drop into both eyes 2 (two) times daily. 05/20/17   [provider]  calcium-vitamin D (OSCAL WITH D) 500-200 MG-UNIT per tablet Take 1 tablet by mouth daily with breakfast.    [provider]  cholecalciferol (VITAMIN D) 1000 UNITS tablet Take 1,000 Units by mouth daily.    [provider]  escitalopram (LEXAPRO) 10 MG tablet Take 10 mg by mouth daily. 11/19/21   [provider]  feeding supplement (ENSURE ENLIVE / ENSURE PLUS) LIQD Take 237 mLs by mouth 3 (three) times daily between meals. Patient taking differently: Take 237 mLs by mouth 2 (two) times daily between meals. 01/12/22   Pahwani, Michell Heinrich, MD  fluorometholone (FML) 0.1 % ophthalmic suspension Place 1 drop into the left eye 4 (four) times daily. Patient not taking: Reported on 04/22/2022 01/17/20   [provider]  furosemide (LASIX) 40 MG tablet Take 2 tablets (80 mg total) by mouth daily. 01/18/22   Arrien, Jimmy Picket, MD   gabapentin (NEURONTIN) 100 MG capsule Take 100 mg by mouth at bedtime. 10/24/19   [provider]  gabapentin (NEURONTIN) 100 MG capsule Take 1 capsule by mouth daily. 02/01/22   [provider]  latanoprost (XALATAN) 0.005 % ophthalmic solution Place 1 drop into both eyes in the morning and at bedtime.  Patient not taking: Reported on 04/22/2022 11/25/15   [provider]  levothyroxine (SYNTHROID, LEVOTHROID) 112 MCG tablet Take 112 mcg by mouth daily before breakfast.  Patient not taking: Reported on 04/22/2022 03/31/14   [provider]  metoprolol tartrate (LOPRESSOR) 25 MG tablet TAKE 1 TABLET (25 MG TOTAL) BY MOUTH 2 (TWO) TIMES DAILY. 02/04/22   [name], Peter M, MD  Multiple Vitamin (MULTIVITAMIN WITH MINERALS) TABS tablet Take 1 tablet by mouth daily. 01/12/22   Pahwani, Michell Heinrich, MD  OXYGEN Inhale into the lungs. 2 liters as needed    [provider]  potassium chloride SA (KLOR-CON M) 20 MEQ tablet Take 1 tablet (20 mEq total) by mouth daily. 02/01/22   [name], Peter M, MD  RHOPRESSA 0.02 %  SOLN Place 1 drop into the left eye at bedtime. 10/28/19   [provider]    Inpatient Medications: Scheduled Meds:  apixaban  2.5 mg Oral BID   brimonidine  1 drop Both Eyes BID   escitalopram  10 mg Oral Daily   gabapentin  100 mg Oral QHS   insulin aspart  0-5 Units Subcutaneous QHS   insulin aspart  0-9 Units Subcutaneous TID WC   [START ON 05/22/2022] levothyroxine  100 mcg Oral Q0600   midodrine  10 mg Oral BID WC   Continuous Infusions:  PRN Meds: acetaminophen **OR** acetaminophen  Allergies:    Allergies  Allergen Reactions   Amlodipine Nausea Only        Benicar [Olmesartan Medoxomil] Nausea Only    Social History:   Social History   Socioeconomic History   Marital status: Widowed    Spouse name: Not on file   Number of children: 4   Years of education: Not on file   Highest education level: Not on file  Occupational  History   Not on file  Tobacco Use   Smoking status: Never   Smokeless tobacco: Never  Substance and Sexual Activity   Alcohol use: No   Drug use: No   Sexual activity: Never  Other Topics Concern   Not on file  Social History Narrative   Not on file   Social Determinants of Health   Financial Resource Strain: Not on file  Food Insecurity: Not on file  Transportation Needs: Not on file  Physical Activity: Not on file  Stress: Not on file  Social Connections: Not on file  Intimate Partner Violence: Not on file    Family History:    Family History  Problem Relation Age of Onset   Heart disease Mother    Liver cancer Father    Heart disease Sister    Heart disease Brother    Stroke Sister      ROS:  Please see the history of present illness.   All other ROS reviewed and negative.     Physical Exam/Data:   Vitals:   05/21/22 1207 05/21/22 1209 05/21/22 1215 05/21/22 1217  BP:  109/65 104/60 (!) 122/59  Pulse: 64 65 77 75  Resp: 19 (!) 22 (!) 30 (!) 27  Temp:  97.7 F (36.5 C)    TempSrc:  Oral    SpO2: 98% 99% 97% 97%  Weight:      Height:       No intake or output data in the 24 hours ending 05/21/22 1350    05/20/2022   11:45 PM 05/20/2022    3:58 PM 04/22/2022   11:43 AM  Last 3 Weights  Weight (lbs) 113 lb 12.8 oz 113 lb 114 lb  Weight (kg) 51.619 kg 51.256 kg 51.71 kg     Body mass index is 18.94 kg/m.  General:  Frail, elderly female in no acute distress. Wearing nasal cannula with 4 L/min HEENT: normal Neck: no JVD Vascular: Radial pulses 2+ bilaterally Cardiac:  Irregular rate and rhythm, grade 2/6 systolic murmur  Lungs:  Faint crackles in bilateral lung bases Abd: soft, nontender, no hepatomegaly  Ext: no edema Musculoskeletal:  No deformities, BUE and BLE strength normal and equal Skin: warm and dry  Neuro:  CNs 2-12 intact, no focal abnormalities noted Psych:  Normal affect   EKG:  The EKG was personally reviewed and demonstrates:   Atrial fibrillation, HR 64m BPM Telemetry:  Telemetry was  personally reviewed and demonstrates:  Atrial fibrillation, HR in the 70s-90s  Relevant CV Studies:   Laboratory Data:  High Sensitivity Troponin:   Recent Labs  Lab 05/20/22 1742 05/20/22 1942  TROPONINIHS 15 16     Chemistry Recent Labs  Lab 05/20/22 1742 05/21/22 0647  NA 135 135  K 3.9 3.8  CL 93* 95*  CO2 31 31  GLUCOSE 122* 110*  BUN 38* 40*  CREATININE 1.53* 1.76*  CALCIUM 9.0 9.0  MG 2.1  --   GFRNONAA 33* 28*  ANIONGAP 11 9    Recent Labs  Lab 05/20/22 1742 05/21/22 0647  PROT 7.4 6.7  ALBUMIN 3.6 3.1*  AST 43* 38  ALT 23 22  ALKPHOS 163* 149*  BILITOT 2.0* 1.9*   Lipids No results for input(s): CHOL, TRIG, HDL, LABVLDL, LDLCALC, CHOLHDL in the last 168 hours.  Hematology Recent Labs  Lab 05/20/22 1742 05/21/22 0647  WBC 8.9 9.1  RBC 4.47 4.26  HGB 12.7 12.1  HCT 39.5 37.2  MCV 88.4 87.3  MCH 28.4 28.4  MCHC 32.2 32.5  RDW 18.3* 18.3*  PLT 111* 102*   Thyroid  Recent Labs  Lab 05/20/22 1742  TSH 0.296*    BNP Recent Labs  Lab 05/20/22 1742  BNP 690.5*    DDimer No results for input(s): DDIMER in the last 168 hours.   Radiology/Studies:  DG Chest Portable 1 View  Result Date: 05/20/2022 CLINICAL DATA:  Pleuritic sharp pain to back. EXAM: PORTABLE CHEST 1 VIEW pleuritic sharp stabbing pain to back. COMPARISON:  AP chest 01/13/2022 and 11/13/2020; CT chest 11/14/2020 FINDINGS: Left chest wall cardiac pacer is seen with leads overlying right atrium and right ventricle. Cardiac silhouette is again mildly to moderately enlarged. Mediastinal contours are within normal limits. There is unchanged mild bilateral interstitial thickening, greatest within the lung bases. Cystic lucencies are again seen within the upper lungs suggesting cystic emphysematous change. No definite pleural effusion. No pneumothorax. Mild levocurvature of the thoracic spine with mild multilevel degenerative  disc changes. IMPRESSION: No significant change from 01/13/2022. Cardiomegaly with mild interstitial pulmonary edema. Electronically Signed   By: Yvonne Kendall M.D.   On: 05/20/2022 18:15   ECHOCARDIOGRAM COMPLETE  Result Date: 05/21/2022    ECHOCARDIOGRAM REPORT   Patient Name:   COLTON ENGDAHL Blanke Date of Exam: 05/21/2022 Medical Rec #:  [id number]      Height:       65.0 in Accession #:    [id number]     Weight:       113.8 lb Date of Birth:  1933/08/17     BSA:          1.557 m Patient Age:    11 years       BP:           115/64 mmHg Patient Gender: F              HR:           72 bpm. Exam Location:  Inpatient Procedure: 2D Echo, Cardiac Doppler and Color Doppler Indications:    CHF  History:        Patient has prior history of Echocardiogram examinations, most                 recent 01/14/2022. CHF, Pacemaker; Risk Factors:Diabetes and                 Dyslipidemia.  Sonographer:    Victor Referring  Phys: [id number] Shela Leff  Sonographer Comments: 07/26/2014 pacemaker IMPRESSIONS  1. Compared to echo from Jan 2023, no significant change in LVEF and RVEF.  2. Left ventricular ejection fraction, by estimation, is >75%. The left ventricle has hyperdynamic function. The left ventricle has no regional wall motion abnormalities. There is severe concentric left ventricular hypertrophy. Left ventricular diastolic parameters are indeterminate.  3. Right ventricular systolic function is moderately reduced. The right ventricular size is moderately enlarged.  4. Left atrial size was severely dilated.  5. Right atrial size was severely dilated.  6. The mitral valve is normal in structure. Mild mitral valve regurgitation.  7. The aortic valve is tricuspid. Aortic valve regurgitation is not visualized. Aortic valve sclerosis is present, with no evidence of aortic valve stenosis.  8. The inferior vena cava is dilated in size with <50% respiratory variability, suggesting right atrial pressure of 15 mmHg. FINDINGS   Left Ventricle: Left ventricular ejection fraction, by estimation, is >75%. The left ventricle has hyperdynamic function. The left ventricle has no regional wall motion abnormalities. The left ventricular internal cavity size was small. There is severe concentric left ventricular hypertrophy. Left ventricular diastolic parameters are indeterminate. Right Ventricle: The right ventricular size is moderately enlarged. Right vetricular wall thickness was not assessed. Right ventricular systolic function is moderately reduced. Left Atrium: Left atrial size was severely dilated. Right Atrium: Right atrial size was severely dilated. Pericardium: Trivial pericardial effusion is present. Mitral Valve: The mitral valve is normal in structure. Mild mitral valve regurgitation. Tricuspid Valve: The tricuspid valve is normal in structure. Tricuspid valve regurgitation is mild. Aortic Valve: The aortic valve is tricuspid. Aortic valve regurgitation is not visualized. Aortic valve sclerosis is present, with no evidence of aortic valve stenosis. Aortic valve mean gradient measures 2.3 mmHg. Aortic valve peak gradient measures 4.4  mmHg. Aortic valve area, by VTI measures 3.46 cm. Pulmonic Valve: The pulmonic valve was normal in structure. Pulmonic valve regurgitation is not visualized. Aorta: The aortic root and ascending aorta are structurally normal, with no evidence of dilitation. Venous: The inferior vena cava is dilated in size with less than 50% respiratory variability, suggesting right atrial pressure of 15 mmHg. IAS/Shunts: No atrial level shunt detected by color flow Doppler. Additional Comments: A device lead is visualized.  LEFT VENTRICLE PLAX 2D LVIDd:         3.20 cm     Diastology LVIDs:         1.80 cm     LV e' medial:    5.56 cm/s LV PW:         1.50 cm     LV E/e' medial:  17.2 LV IVS:        1.80 cm     LV e' lateral:   7.37 cm/s LVOT diam:     2.30 cm     LV E/e' lateral: 13.0 LV SV:         65 LV SV Index:   42  LVOT Area:     4.15 cm  LV Volumes (MOD) LV vol d, MOD A2C: 23.7 ml LV vol d, MOD A4C: 24.9 ml LV vol s, MOD A2C: 11.9 ml LV vol s, MOD A4C: 10.1 ml LV SV MOD A2C:     11.8 ml LV SV MOD A4C:     24.9 ml LV SV MOD BP:      13.6 ml RIGHT VENTRICLE RV Basal diam:  3.50 cm RV Mid diam:    1.80 cm  RV S prime:     7.74 cm/s TAPSE (M-mode): 1.3 cm LEFT ATRIUM             Index        RIGHT ATRIUM           Index LA Vol (A2C):   40.5 ml 26.02 ml/m  RA Area:     22.20 cm LA Vol (A4C):   66.2 ml 42.53 ml/m  RA Volume:   67.20 ml  43.17 ml/m LA Biplane Vol: 55.2 ml 35.46 ml/m  AORTIC VALVE                    PULMONIC VALVE AV Area (Vmax):    3.34 cm     PV Vmax:       0.74 m/s AV Area (Vmean):   3.24 cm     PV Vmean:      51.575 cm/s AV Area (VTI):     3.46 cm     PV VTI:        0.134 m AV Vmax:           104.67 cm/s  PV Peak grad:  2.2 mmHg AV Vmean:          71.233 cm/s  PV Mean grad:  1.0 mmHg AV VTI:            0.187 m AV Peak Grad:      4.4 mmHg AV Mean Grad:      2.3 mmHg LVOT Vmax:         84.20 cm/s LVOT Vmean:        55.500 cm/s LVOT VTI:          0.156 m LVOT/AV VTI ratio: 0.83  AORTA Ao Root diam: 2.90 cm Ao Asc diam:  3.50 cm MV E velocity: 95.67 cm/s  TRICUSPID VALVE                            TR Peak grad:   68.9 mmHg                            TR Vmax:        415.00 cm/s                             SHUNTS                            Systemic VTI:  0.16 m                            Systemic Diam: 2.30 cm Dorris Carnes MD Electronically signed by Dorris Carnes MD Signature Date/Time: 05/21/2022/10:53:17 AM    Final    CT Angio Chest/Abd/Pel for Dissection W and/or Wo Contrast  Result Date: 05/20/2022 CLINICAL DATA:  Chest and back pain. EXAM: CT ANGIOGRAPHY CHEST, ABDOMEN AND PELVIS TECHNIQUE: Non-contrast CT of the chest was initially obtained. Multidetector CT imaging through the chest, abdomen and pelvis was performed using the standard protocol during bolus administration of intravenous contrast.  Multiplanar reconstructed images and MIPs were obtained and reviewed to evaluate the vascular anatomy. RADIATION DOSE REDUCTION: This exam was performed according to the departmental dose-optimization program which includes automated exposure control, adjustment of the mA and/or kV according  to patient size and/or use of iterative reconstruction technique. CONTRAST:  71mL OMNIPAQUE IOHEXOL 350 MG/ML SOLN COMPARISON:  CT chest 11/14/2020 FINDINGS: CTA CHEST FINDINGS Cardiovascular: Heart is moderately enlarged, unchanged. There is a new small pericardial effusion. There is adequate opacification of the thoracic aorta. There is no evidence for aortic dissection or aneurysm. There is calcified atherosclerotic disease throughout the aorta. Left-sided pacemaker is present with leads in the right atrium and right ventricle. Mediastinum/Nodes: No enlarged mediastinal, hilar, or axillary lymph nodes. Thyroid gland, trachea, and esophagus demonstrate no significant findings. Lungs/Pleura: There are trace bilateral pleural effusions scratch at trace right and small left pleural effusions are present. There is a small amount of atelectasis in the bilateral lower lobes, right middle lobe and lingula. There is a calcified granuloma in the right lung apex, unchanged. Pneumothorax. Musculoskeletal: No acute fracture. Review of the MIP images confirms the above findings. CTA ABDOMEN AND PELVIS FINDINGS VASCULAR Aorta: Normal caliber aorta without aneurysm, dissection, vasculitis or significant stenosis. There are atherosclerotic calcifications of the aorta. Celiac: Patent without evidence of aneurysm, dissection, vasculitis or significant stenosis. SMA: Patent without evidence of aneurysm, dissection, vasculitis or significant stenosis. Renals: There is moderate stenosis at the origin of the right renal artery. Left renal artery is within normal limits. IMA: Patent without evidence of aneurysm, dissection, vasculitis or significant  stenosis. Inflow: Patent without evidence of aneurysm, dissection, vasculitis or significant stenosis. Veins: No obvious venous abnormality within the limitations of this arterial phase study. Review of the MIP images confirms the above findings. NON-VASCULAR Hepatobiliary: No focal liver abnormality is seen. Status post cholecystectomy. No biliary dilatation. Pancreas: Unremarkable. No pancreatic ductal dilatation or surrounding inflammatory changes. Spleen: Normal in size without focal abnormality. Adrenals/Urinary Tract: Renal cortical hypodensities are seen in both kidneys which are too small to characterize, likely cysts. There is no hydronephrosis or perinephric fluid. The adrenal glands and bladder are within normal limits. Stomach/Bowel: Stomach is within normal limits. No evidence of bowel wall thickening, distention, or inflammatory changes. Appendix is not seen. There is sigmoid colon diverticulosis without evidence for acute diverticulitis. Lymphatic: No enlarged lymph nodes are seen. Reproductive: Uterus and bilateral adnexa are unremarkable. Other: No abdominal wall hernia or abnormality. Small amount of free fluid in the pelvis. Musculoskeletal: There is scoliosis and degenerative change throughout the lumbar spine. Review of the MIP images confirms the above findings. IMPRESSION: 1. No evidence for aortic dissection or aneurysm. 2. Moderate cardiomegaly with small pericardial effusion. 3. Small bilateral pleural effusions. 4. Small amount of free fluid in the pelvis of uncertain etiology. 5. Sigmoid colon diverticulosis. 6.  Aortic Atherosclerosis (ICD10-I70.0). Electronically Signed   By: Ronney Asters M.D.   On: 05/20/2022 19:37     Assessment and Plan:   Acute on Chronic hypoxic respiratory failure  Acute on Chronic diastolic heart failure - Patient reports using 2L oxygen at home, required 6-7 L while in the ED. Now on 4 L   - BNP was elevated to 690.5 - CXR with cardiomegaly and mild  intersitial pulmonary edema - CTA chest/abd/pel showed moderate cardiomegaly with small pericardial effusion, small bilateral pleural effusions  - Patient received one dose of lasix 40 mg IV this morning-- BP did decrease from 115/64 down to 79/50. BP now improved.  - I/Os incomplete  - Start IV lasix 20 mg BID, see how BP tolerates  - Hold BB for now to allow BP room for diuresis  - Strict I/Os, daily weights  - Monitor  electrolytes and renal function during diuresis  - Echocardiogram pending  Small Pericardial Effusion  - Noted on CTA chest/abd/pelvis  - Patient had a trivial pericardial effusion on echocardiogram 01/14/22. Possibly chronic effusion  - Echocardiogram pending   Chest Pain  - Patient reports having chest tightness when she deeply breathes. Denis any positional chest pain.  - hsTn negative - Improved with diuresis, possibly related to CHF exacerbation - Negative troponin and atypical chest pain-- not consistent with ACS   Persistent A-Fib  - Continue eliquis 2.5 mg BID (dose adjusted due to age, weight)  - Holding BB for now to allow BP room for diuresis. Per telemetry, patient is maintaining HR in the 70s-90s   Otherwise per primary  - Type 2 DM  - Hypothyroidism  - Mild thrombocytopenia  - Mild elevation in liver enzymes  - PAD     Risk Assessment/Risk Scores:     HEAR Score (for undifferentiated chest pain):  HEAR Score: 6{  New York Heart Association (NYHA) Functional Class NYHA Class II  CHA2DS2-VASc Score = 6   This indicates a 9.7% annual risk of stroke. The patient's score is based upon: CHF History: 1 HTN History: 1 Diabetes History: 1 Stroke History: 0 Vascular Disease History: 0 Age Score: 2 Gender Score: 1       For questions or updates, please contact [place] Please consult [email or web address] for contact info under    Signed, Margie Billet, PA-C  05/21/2022 1:50 PM  I have personally seen and examined this patient. I  agree with the assessment and plan as outlined above.  88 yo female with congenital deafness, chronic diastolic CHF, PAD, persistent atrial fib, HTN. HLD, DM, CKD, sick sinus syndrome s/p pacemaker who was admitted with c/o dyspnea and chest pain. No PE on chest CTA. She does have small pleural effusions, small pericardial effusion and mild pulmonary edema. Her pain only occurs with deep inspiration. Troponin negative. BNP mildly elevated. EKG with atrial fib with chronic T wave changes. No chest pain currently.  My exam: Irreg irreg. Crackles in bases. No LE edema Plan: Acute on chronic diastolic CHF: Her dyspnea and chest pain is likely due to mild volume overload. I agree with diuresis with IV lasix as BP allows. Chest CT shows a small pericardial effusion. She had a trivial effusion on echo in January 2023. Will repeat echo. I do not think an ischemic evaluation is necessary.   Lauree Chandler, MD, [facility] 05/21/2022 2:33 PM

## 2022-05-21 NOTE — Progress Notes (Addendum)
Patient seen and examined, admitted earlier this morning by Dr. Marlowe Sax, please see H&P for details -Chronically ill 88/F with history of congenital deafness needs sign language interpreter, chronic diastolic CHF, pulmonary hypertension, moderate TR, severe TS, persistent A-fib on Eliquis, SSS s/p PPM, CAD, PAD, hypertension, type 2 diabetes mellitus, hypothyroidism presented to the ED with chest pain and shortness of breath.  She is on 2 L O2 at baseline, unclear if she has COPD. -In the ED O2 sats were 84% on 2 L, labs noted sodium of 135, creatinine 1.5, BNP 690, high-sensitivity troponin was negative x2, CTA chest was negative for PE, noted small pericardial effusion and small bilateral pleural effusions. -History is limited by patient participation despite sign language interpretation -Overnight received Lasix 40 Mg, blood pressure dropped to 70 this morning, improved after 250 mL bolus  Acute hypoxic respiratory failure Acute on chronic diastolic CHF, pulmonary hypertension, TR, TS -CT chest noted small bilateral effusions, pericardial effusion  -echo 1/23 noted EF 60-65%, enlarged RV and pericardial effusion -Clinically mildly volume overloaded, diuretics currently limited by hypotension -Add midodrine -Will request cards input and repeat echo to assess severity of pericardial effusion  Chest pain History of CAD Pericardial effusion -High-sensitivity troponins are negative, CTA negative for PE -Looks like she has had a chronic pericardial effusion, follow-up repeat echo  Persistent A-fib -Continue Eliquis, hold metoprolol for now, resume as BP tolerates  Congenital deafness  CKD 3b -Stable, baseline creatinine around 1.4-1.8  Type 2 diabetes mellitus -CBGs are stable, continue sliding scale insulin for now, check HbA1c  Mild chronic thrombocytopenia  Hypothyroidism -On Synthroid at baseline, TSH mildly suppressed, will decrease Synthroid dose  DNR  Domenic Polite,  MD

## 2022-05-21 NOTE — Plan of Care (Signed)
?  Problem: Activity: ?Goal: Capacity to carry out activities will improve ?Outcome: Progressing ?  ?

## 2022-05-21 NOTE — Progress Notes (Signed)
Initial Nutrition Assessment  DOCUMENTATION CODES:   Severe malnutrition in context of chronic illness  INTERVENTION:   Ensure Enlive po TID, each supplement provides 350 kcal and 20 grams of protein.  Encourage PO intake  Provided handouts on heart failure and undernutrition on patient's AVS.   Liberalize diet to Carb Modified with fluid restriction per MD  NUTRITION DIAGNOSIS:   Severe Malnutrition related to chronic illness (CHF) as evidenced by severe muscle depletion, severe fat depletion.  GOAL:   Patient will meet greater than or equal to 90% of their needs  MONITOR:   PO intake, Supplement acceptance  REASON FOR ASSESSMENT:   Malnutrition Screening Tool    ASSESSMENT:   Pt with PMH of congenital deafness needing sign language interpreter, CKD stage III, CHF, requires 2L O2 at home, a-fib on eliquis, SSS s/p PPM, CAD, PAD, HTN, HLD, DM now admitted with acute on chronic hypoxic respiratory failure secondary to acute on chronic CHF.    Spoke with pt via interpreter # [id number]. Pt reports that she has lost a lot of weight. Her usual weight was 150 lb 2 years ago and she is now down to 113 lb. She has been losing weight during this time and she noticed that she loses more after each hospitalization. Pt is non-specific on what she eats at meals. States that she is unable to find many foods to eat that are lower sodium and tasty. She reports eating vegetables, chicken and fish but they are bland and she does not enjoy eating. She does like ensure and prefers chocolate.   Pt currently on 5L O2 via [place] Medications reviewed and include: SSI, synthroid Labs reviewed: A1C: 6.1, BNP: 690    NUTRITION - FOCUSED PHYSICAL EXAM:  Flowsheet Row Most Recent Value  Orbital Region Severe depletion  Upper Arm Region Severe depletion  Thoracic and Lumbar Region Severe depletion  Buccal Region Severe depletion  Temple Region Severe depletion  Clavicle Bone Region Severe depletion   Clavicle and Acromion Bone Region Severe depletion  Scapular Bone Region Severe depletion  Dorsal Hand Severe depletion  Patellar Region Moderate depletion  Anterior Thigh Region Moderate depletion  Posterior Calf Region Severe depletion  Edema (RD Assessment) Mild  Hair Reviewed  Eyes Reviewed  Mouth Reviewed  Skin Reviewed  Nails Reviewed       Diet Order:   Diet Order             Diet Carb Modified Fluid consistency: Thin; Room service appropriate? Yes; Fluid restriction: 1200 mL Fluid  Diet effective now                   EDUCATION NEEDS:   Education needs have been addressed  Skin:  Skin Assessment: Reviewed RN Assessment  Last BM:  5/29  Height:   Ht Readings from Last 1 Encounters:  05/20/22 5\' 5"  (1.651 m)    Weight:   Wt Readings from Last 1 Encounters:  05/20/22 51.6 kg    BMI:  Body mass index is 18.94 kg/m.  Estimated Nutritional Needs:   Kcal:  1600-1800  Protein:  85-100 grams  Fluid:  > 1.6 L/day   Lockie Pares., RD, LDN, CNSC See [facility] for contact information

## 2022-05-21 NOTE — Discharge Instructions (Signed)
Heart Failure Nutrition Therapy For The Undernourished  This nutrition therapy will help you eat more calories and protein in addition to helping your heart. These suggestions can help you if you can't eat enough, have lost weight, or need extra calories and protein in your diet. This plan focuses on: Eating more calories.  Eating more calories can give you more energy, help you gain weight, and promote wound healing. Eating more protein. Protein can help you heal, give you energy, and build and repair muscle. Ask your registered dietitian nutritionist (RDN) how much protein is the right amount for you. Eating a low-sodium diet while increasing your calorie and protein intake. This will help control buildup of fluids around your heart, stomach, lungs, and legs. Too much sodium may make your blood pressure too high and put stress on your heart. You can achieve these goals by: Eating more calories and protein. Eating less than 2,000 milligrams of sodium per day. Reading food labels to keep track of how much sodium, protein, and calories are in the foods you eat. Limiting fluid intake if your doctor has asked you to follow a fluid restriction. Reading the Food Label: How Much Sodium Is too Much? The nutrition plan for heart failure usually limits the sodium that you get from food and beverages to 2,000 milligrams per day. Salt is the main source of sodium. Read the nutrition label to find out how much sodium is in 1 serving. Foods with more than 300 milligrams of sodium per serving may not fit into a reduced-sodium meal plan. Check serving sizes on the label. If you eat more than 1 serving, you will get more sodium than the amount listed. You can find protein and calories on the Nutrition Facts label too. Eating More Protein and Calories Eat at least 6 small meals throughout the day. If you become full quickly after you begin eating, you may benefit from small, frequent meals instead of 3 large  meals. Keep high-calorie and high-protein snacks available in your car or bag for when you get hungry. Add extra calories and protein when cooking meals. Cook with unsalted butter, margarine, or oils instead of calorie-free cooking spray. Use reduced-fat (2%) milk instead of fat-free (skim) milk. Ask your RDN if whole milk is recommended for you. Add milk powder to protein shakes, cereal, or casseroles. Sprinkle unsalted nuts and seeds into your cereal, stir-fry, or salad. Add 1 tablespoon mayonnaise, sugar, or honey to foods (adds 50 to 100 calories). Add calories and protein to your fruits and vegetables. Fruits are low in calories. Add peanut butter, yogurt, or cottage cheese to add calories and protein. Buy fruit cups canned in syrup instead of water or juice. Vegetables are also low in calories. Add butter, sour cream, margarine, or oil for extra calories. Potatoes, corn, and peas are higher in calories than nonstarchy vegetables. Avocados are rich in calories and healthy fat. Eat them alone or use them as a topping or spread. Limit foods that have little to no nutrition. Eat fewer calorie-free and low-calorie foods such as applesauce, Jell-O, and diet soft drinks. These foods will take up space in your stomach and may leave little room for higher-calorie foods and beverages.  When shopping, avoid products that say "low calorie" or "low fat." Drink high-calorie beverages. There are several liquid nutrition supplements available that also have less than 300 milligrams of sodium per serving. Drink them between meals as snacks.  Make your own supplement at home with milk or ice  cream.  Ask your RDN if protein powder would also be a good addition. Milk and juice are high in calories. Limit plain tea, coffee, and water. These have no calories.  Foods Recommended Food Group Foods Recommended  Grains Whole wheat bread with less than 80 milligrams of sodium per slice  Many cold cereals,  especially shredded wheat and granola Oats or cream of wheat made with milk (add butter or margarine, sugar, dried fruit, and nuts for more calories and fat) Quinoa Wheat germ (sprinkle on soups, baked goods, or cereal)  Vegetables Fresh and frozen vegetables without added sauces, salt, or sodium Homemade soups (salt free or low sodium) with dry milk powder or cream Potatoes, corn, and peas  Fruits Canned fruits (canned in heavy syrup) Dried fruits, such as raisins, cranberries, and prunes Avocados  Dairy (Milk and Milk Products) Milk or milk powder Soy milk Yogurt, including Greek yogurt Small amounts of natural, blocked cheeses or reduced-sodium cheese (Swiss, ricotta, and fresh mozzarella are lower in sodium than others) Regular or soft cream cheese and low-sodium cottage cheese  Protein Foods (Meat, Poultry, Fish, and Beans) Fresh meat and fish Turkey bacon (check the Nutrition Facts label to make sure its not packaged in a sodium solution) Tuna canned or packed in oil Dried beans, peas, and legumes; edamame (fresh soybeans) Eggs Unsalted nuts or peanut butter  Desserts and Snacks Apple (with peanut butter); baked apple with sugar and butter             Granola bars Pudding made with reduced-fat (2%) milk or milk powder Smoothies  Custard Chocolate syrup added to milk or ice cream  Fats Tub or liquid margarine (trans fat-free) Butter (check with your doctor or RDN first) Unsaturated fat oils (canola, olive, corn, sunflower, safflower, peanut, vegetable, and soybean) Flaxseed (oil or ground)  Sodium-Free Condiments Fresh or dried herbs; pepper; vinegar; lemon juice or lime juice; salt-free seasoning mixes and marinades such as Mrs. Dash or McCormick's salt-free blend; simple salad dressings such as vinegar and oil; low-sodium ketchup. You can purchase salt-free barbecue sauce and many others on the Internet.  Ask your RDN.     Foods Not Recommended Food Group Foods Not  Recommended  Grains Breads or crackers topped with salt Cereals (hot/cold) with more than 300 milligrams sodium per serving Biscuits, cornbread, and other "quick" breads prepared with baking soda Prepackaged bread crumbs Self-rising flours  Vegetables Canned vegetables (unless they are salt free or low sodium) Frozen vegetables with high-sodium seasoning and sauces Sauerkraut and pickled vegetables Canned or dried soups (unless they are salt free or low  sodium) French fries and onion rings Broth-based soups  Fruits Dried fruits preserved with sodium-containing additives  Dairy (Milk and Milk Products) Buttermilk Processed cheeses such as Cheese Wiz, Velveeta, and Queso Feta cheese Shredded cheese (has more sodium than blocked cheeses) "Singles" cheese slices and string cheese  Protein Foods (Meat, Poultry, Fish, and Beans) Cured meats (bacon, ham, sausage, pepperoni, and hot dogs)  Canned meats (chili, Vienna sausage, sardines, and Spam) Smoked fish and meats Frozen meals with more than 600 milligrams of sodium Egg Beaters   Fats Salted butter or margarine  Condiments Salt, sea salt, kosher salt, onion salt, and garlic salt Seasoning mixes containing salt such as Lemon Pepper or Lawry's Bouillon cubes Catsup or ketchup Barbecue sauce and Worcestershire sauce Soy sauce Salsa, pickles, olives, relish Salad dressings: ranch, blue cheese, Italian, and French  Alcohol Check with your doctor.   Heart Failure (  Undernourished) Sample 1-Day Menu View Nutrient Info Breakfast 1 cup oatmeal made with milk 1 tablespoon wheat germ (sprinkle on oatmeal) 1 cup (240 milliliters) reduced-fat (2%) milk 2 tablespoons chocolate syrup (add to milk) 1 banana 2 tablespoons peanut butter (for banana)  Morning Snack 1/4 cup raw almonds Cranberries (add to almonds) 1 cup (240 milliliters) oral nutritional supplement  Lunch 3 ounces grilled chicken breast 1 small potato 2 ounces cheese (to add to  potato) 2 tablespoons margarine (to add to potato) Croutons (add to salad) Walnuts (add to salad) Vinegar and oil dressing (add to salad) 1 cup (240 milliliters) juice  Afternoon Snack 10 tortilla chips Guacamole (avocado, lime juice, onion, and tomatoes, and pepper) 1 cup (240 milliliters) water or juice  Evening Meal 3 ounces herb-baked fish 1 cup homemade mashed potatoes with margarine (trans fat-free) and milk 1/2 cup creamed spinach 1 cup (240 milliliters) water or juice  Evening Snack 1 cup (240 milliliters) homemade milkshake 1 slice low-sodium [place] breast 1 slice whole wheat bread 1 slice cheese Mayonnaise

## 2022-05-21 NOTE — H&P (Signed)
History and Physical    Veronica Young JGG:[id number] DOB: [birth year] DOA: 05/20/2022  PCP: Katherina Mires, MD  Patient coming from: [facility] ED  Chief Complaint: Chest pain  HPI: Veronica Young is a 88 y.o. female with medical history significant of congenital deafness needing sign language interpretation, CKD stage IIIb, chronic diastolic CHF, persistent A-fib on Eliquis, paroxysmal atrial tachycardia, sick sinus syndrome status post PPM, CAD, PAD, hypertension, hyperlipidemia, type 2 diabetes, hypothyroidism presented to the ED with 2 days of sharp stabbing pleuritic chest discomfort without associated shortness of breath and hypoxia.  Patient uses 2 L supplemental oxygen at home at times.  In the ED, she was satting 84% on 2 L and required 7 L oxygen.  Labs showing WBC 8.9, hemoglobin 12.7, platelet count 111k (chronically low).  Sodium 135, potassium 3.9, chloride 93, bicarb 31, BUN 38, creatinine 1.5 (stable), glucose 122.  AST 43, ALT 23, alkaline phosphatase 163, T. bili 2.0.  Lipase normal.  High sensitive troponin negative x2.  TSH low at 0.296.  Magnesium 2.1.  BNP 690.  CTA negative for aortic dissection or PE but showing small pericardial effusion and small bilateral pleural effusions.  Lasix was held due to low blood pressure.  Sign language video interpreter used.  Patient states yesterday she was just sitting when she started having substernal chest pain. She feels better now and no longer having any chest pain.  She is not able to describe how the pain felt.  Denies shortness of breath, orthopnea, or lower extremity edema.  Denies fevers or cough.  She takes her medications on her own and sometimes her daughter helps her.  She thinks she takes Lasix but is not sure about the dose.  No other complaints.  Review of Systems:  Review of Systems  All other systems reviewed and are negative.  Past Medical History:  Diagnosis Date   Carotid bruit    CHF (congestive heart failure) (HCC)     Deafness    Diabetes mellitus    type 2   Diastolic heart failure (HCC)    EF is 60%   GERD (gastroesophageal reflux disease)    Glaucoma    Hypercholesterolemia    Hypertension    Hypothyroidism    Normal cardiac stress test January 2014   PAD (peripheral artery disease) ([place])    a. known bilateral popliteal occlusions with conservative management favored.   PAT (paroxysmal atrial tachycardia) (HCC)    Sick sinus syndrome [facility])    a. s/p PPM placement 07/2014    Past Surgical History:  Procedure Laterality Date   cholycystectomy     left breast cyst removed     PACEMAKER INSERTION  07-26-2014   MDT ADDRL1 pacemaker implanted by Dr Lovena Le for [place] N/A 07/26/2014   Procedure: PERMANENT PACEMAKER INSERTION;  Surgeon: Evans Lance, MD;  Location: [facility] CATH LAB;  Service: Cardiovascular;  Laterality: N/A;   TOTAL KNEE ARTHROPLASTY     right     reports that she has never smoked. She has never used smokeless tobacco. She reports that she does not drink alcohol and does not use drugs.  Allergies  Allergen Reactions   Amlodipine Nausea Only        Benicar [Olmesartan Medoxomil] Nausea Only    Family History  Problem Relation Age of Onset   Heart disease Mother    Liver cancer Father    Heart disease Sister    Heart disease Brother  Stroke Sister     Prior to Admission medications   Medication Sig Start Date End Date Taking? Authorizing Provider  acetaminophen (TYLENOL) 500 MG tablet Take 1,000 mg by mouth every 6 (six) hours as needed for moderate pain. Patient not taking: Reported on 02/01/2022    [provider]  apixaban (ELIQUIS) 2.5 MG TABS tablet Take 1 tablet (2.5 mg total) by mouth 2 (two) times daily. 11/09/21   [name], Peter M, MD  atropine 1 % ophthalmic solution Place 1 drop into the left eye at bedtime.     [provider]  brimonidine (ALPHAGAN) 0.2 % ophthalmic solution Place 1 drop into both eyes 2 (two)  times daily. 05/20/17   [provider]  calcium-vitamin D (OSCAL WITH D) 500-200 MG-UNIT per tablet Take 1 tablet by mouth daily with breakfast.    [provider]  cholecalciferol (VITAMIN D) 1000 UNITS tablet Take 1,000 Units by mouth daily.    [provider]  escitalopram (LEXAPRO) 10 MG tablet Take 10 mg by mouth daily. 11/19/21   [provider]  feeding supplement (ENSURE ENLIVE / ENSURE PLUS) LIQD Take 237 mLs by mouth 3 (three) times daily between meals. Patient taking differently: Take 237 mLs by mouth 2 (two) times daily between meals. 01/12/22   Pahwani, Michell Heinrich, MD  fluorometholone (FML) 0.1 % ophthalmic suspension Place 1 drop into the left eye 4 (four) times daily. Patient not taking: Reported on 04/22/2022 01/17/20   [provider]  furosemide (LASIX) 40 MG tablet Take 2 tablets (80 mg total) by mouth daily. 01/18/22   Arrien, Jimmy Picket, MD  gabapentin (NEURONTIN) 100 MG capsule Take 100 mg by mouth at bedtime. 10/24/19   [provider]  gabapentin (NEURONTIN) 100 MG capsule Take 1 capsule by mouth daily. 02/01/22   [provider]  latanoprost (XALATAN) 0.005 % ophthalmic solution Place 1 drop into both eyes in the morning and at bedtime.  Patient not taking: Reported on 04/22/2022 11/25/15   [provider]  levothyroxine (SYNTHROID, LEVOTHROID) 112 MCG tablet Take 112 mcg by mouth daily before breakfast.  Patient not taking: Reported on 04/22/2022 03/31/14   [provider]  metoprolol tartrate (LOPRESSOR) 25 MG tablet TAKE 1 TABLET (25 MG TOTAL) BY MOUTH 2 (TWO) TIMES DAILY. 02/04/22   [name], Peter M, MD  Multiple Vitamin (MULTIVITAMIN WITH MINERALS) TABS tablet Take 1 tablet by mouth daily. 01/12/22   Pahwani, Michell Heinrich, MD  OXYGEN Inhale into the lungs. 2 liters as needed    [provider]  potassium chloride SA (KLOR-CON M) 20 MEQ tablet Take 1 tablet (20 mEq total) by mouth daily. 02/01/22    [name], Peter M, MD  RHOPRESSA 0.02 % SOLN Place 1 drop into the left eye at bedtime. 10/28/19   [provider]    Physical Exam: Vitals:   05/20/22 2200 05/20/22 2225 05/20/22 2345 05/20/22 2348  BP: (!) 104/55 (!) 110/59 (!) 112/50 (!) 112/50  Pulse: 80 79 75 75  Resp: (!) 26 18 20 20   Temp:   99.7 F (37.6 C) 99.7 F (37.6 C)  TempSrc:   Oral   SpO2: 90% 92% 96%   Weight:   51.6 kg   Height:   5\' 5"  (1.651 m)     Physical Exam Vitals reviewed.  Constitutional:      General: She is not in acute distress. HENT:     Head: Normocephalic and atraumatic.  Cardiovascular:  Rate and Rhythm: Normal rate and regular rhythm.     Pulses: Normal pulses.  Pulmonary:     Effort: Pulmonary effort is normal. No respiratory distress.     Breath sounds: No wheezing.  Abdominal:     General: Bowel sounds are normal. There is no distension.     Palpations: Abdomen is soft.     Tenderness: There is no abdominal tenderness.  Musculoskeletal:        General: No swelling or tenderness.     Cervical back: Normal range of motion.  Skin:    General: Skin is warm and dry.  Neurological:     General: No focal deficit present.     Mental Status: She is alert and oriented to person, place, and time.     Labs on Admission: I have personally reviewed following labs and imaging studies  CBC: Recent Labs  Lab 05/20/22 1742  WBC 8.9  NEUTROABS 6.9  HGB 12.7  HCT 39.5  MCV 88.4  PLT 416*   Basic Metabolic Panel: Recent Labs  Lab 05/20/22 1742  NA 135  K 3.9  CL 93*  CO2 31  GLUCOSE 122*  BUN 38*  CREATININE 1.53*  CALCIUM 9.0  MG 2.1   GFR: Estimated Creatinine Clearance: 21.5 mL/min (A) (by C-G formula based on SCr of 1.53 mg/dL (H)). Liver Function Tests: Recent Labs  Lab 05/20/22 1742  AST 43*  ALT 23  ALKPHOS 163*  BILITOT 2.0*  PROT 7.4  ALBUMIN 3.6   Recent Labs  Lab 05/20/22 1742  LIPASE 36   No results for input(s): AMMONIA in the last 168  hours. Coagulation Profile: No results for input(s): INR, PROTIME in the last 168 hours. Cardiac Enzymes: No results for input(s): CKTOTAL, CKMB, CKMBINDEX, TROPONINI in the last 168 hours. BNP (last 3 results) No results for input(s): PROBNP in the last 8760 hours. HbA1C: No results for input(s): HGBA1C in the last 72 hours. CBG: Recent Labs  Lab 05/20/22 2342  GLUCAP 233*   Lipid Profile: No results for input(s): CHOL, HDL, LDLCALC, TRIG, CHOLHDL, LDLDIRECT in the last 72 hours. Thyroid Function Tests: Recent Labs    05/20/22 1742  TSH 0.296*   Anemia Panel: No results for input(s): VITAMINB12, FOLATE, FERRITIN, TIBC, IRON, RETICCTPCT in the last 72 hours. Urine analysis:    Component Value Date/Time   COLORURINE YELLOW 01/08/2022 2230   APPEARANCEUR CLEAR 01/08/2022 2230   LABSPEC <1.005 (L) 01/08/2022 2230   PHURINE 5.5 01/08/2022 2230   GLUCOSEU NEGATIVE 01/08/2022 2230   HGBUR LARGE (A) 01/08/2022 2230   BILIRUBINUR NEGATIVE 01/08/2022 2230   KETONESUR NEGATIVE 01/08/2022 2230   PROTEINUR 30 (A) 01/08/2022 2230   UROBILINOGEN 0.2 11/27/2012 1120   NITRITE NEGATIVE 01/08/2022 2230   LEUKOCYTESUR NEGATIVE 01/08/2022 2230    Radiological Exams on Admission: I have personally reviewed images DG Chest Portable 1 View  Result Date: 05/20/2022 CLINICAL DATA:  Pleuritic sharp pain to back. EXAM: PORTABLE CHEST 1 VIEW pleuritic sharp stabbing pain to back. COMPARISON:  AP chest 01/13/2022 and 11/13/2020; CT chest 11/14/2020 FINDINGS: Left chest wall cardiac pacer is seen with leads overlying right atrium and right ventricle. Cardiac silhouette is again mildly to moderately enlarged. Mediastinal contours are within normal limits. There is unchanged mild bilateral interstitial thickening, greatest within the lung bases. Cystic lucencies are again seen within the upper lungs suggesting cystic emphysematous change. No definite pleural effusion. No pneumothorax. Mild  levocurvature of the thoracic spine with mild multilevel  degenerative disc changes. IMPRESSION: No significant change from 01/13/2022. Cardiomegaly with mild interstitial pulmonary edema. Electronically Signed   By: Yvonne Kendall M.D.   On: 05/20/2022 18:15   CT Angio Chest/Abd/Pel for Dissection W and/or Wo Contrast  Result Date: 05/20/2022 CLINICAL DATA:  Chest and back pain. EXAM: CT ANGIOGRAPHY CHEST, ABDOMEN AND PELVIS TECHNIQUE: Non-contrast CT of the chest was initially obtained. Multidetector CT imaging through the chest, abdomen and pelvis was performed using the standard protocol during bolus administration of intravenous contrast. Multiplanar reconstructed images and MIPs were obtained and reviewed to evaluate the vascular anatomy. RADIATION DOSE REDUCTION: This exam was performed according to the departmental dose-optimization program which includes automated exposure control, adjustment of the mA and/or kV according to patient size and/or use of iterative reconstruction technique. CONTRAST:  77mL OMNIPAQUE IOHEXOL 350 MG/ML SOLN COMPARISON:  CT chest 11/14/2020 FINDINGS: CTA CHEST FINDINGS Cardiovascular: Heart is moderately enlarged, unchanged. There is a new small pericardial effusion. There is adequate opacification of the thoracic aorta. There is no evidence for aortic dissection or aneurysm. There is calcified atherosclerotic disease throughout the aorta. Left-sided pacemaker is present with leads in the right atrium and right ventricle. Mediastinum/Nodes: No enlarged mediastinal, hilar, or axillary lymph nodes. Thyroid gland, trachea, and esophagus demonstrate no significant findings. Lungs/Pleura: There are trace bilateral pleural effusions scratch at trace right and small left pleural effusions are present. There is a small amount of atelectasis in the bilateral lower lobes, right middle lobe and lingula. There is a calcified granuloma in the right lung apex, unchanged. Pneumothorax.  Musculoskeletal: No acute fracture. Review of the MIP images confirms the above findings. CTA ABDOMEN AND PELVIS FINDINGS VASCULAR Aorta: Normal caliber aorta without aneurysm, dissection, vasculitis or significant stenosis. There are atherosclerotic calcifications of the aorta. Celiac: Patent without evidence of aneurysm, dissection, vasculitis or significant stenosis. SMA: Patent without evidence of aneurysm, dissection, vasculitis or significant stenosis. Renals: There is moderate stenosis at the origin of the right renal artery. Left renal artery is within normal limits. IMA: Patent without evidence of aneurysm, dissection, vasculitis or significant stenosis. Inflow: Patent without evidence of aneurysm, dissection, vasculitis or significant stenosis. Veins: No obvious venous abnormality within the limitations of this arterial phase study. Review of the MIP images confirms the above findings. NON-VASCULAR Hepatobiliary: No focal liver abnormality is seen. Status post cholecystectomy. No biliary dilatation. Pancreas: Unremarkable. No pancreatic ductal dilatation or surrounding inflammatory changes. Spleen: Normal in size without focal abnormality. Adrenals/Urinary Tract: Renal cortical hypodensities are seen in both kidneys which are too small to characterize, likely cysts. There is no hydronephrosis or perinephric fluid. The adrenal glands and bladder are within normal limits. Stomach/Bowel: Stomach is within normal limits. No evidence of bowel wall thickening, distention, or inflammatory changes. Appendix is not seen. There is sigmoid colon diverticulosis without evidence for acute diverticulitis. Lymphatic: No enlarged lymph nodes are seen. Reproductive: Uterus and bilateral adnexa are unremarkable. Other: No abdominal wall hernia or abnormality. Small amount of free fluid in the pelvis. Musculoskeletal: There is scoliosis and degenerative change throughout the lumbar spine. Review of the MIP images confirms  the above findings. IMPRESSION: 1. No evidence for aortic dissection or aneurysm. 2. Moderate cardiomegaly with small pericardial effusion. 3. Small bilateral pleural effusions. 4. Small amount of free fluid in the pelvis of uncertain etiology. 5. Sigmoid colon diverticulosis. 6.  Aortic Atherosclerosis (ICD10-I70.0). Electronically Signed   By: Ronney Asters M.D.   On: 05/20/2022 19:37    EKG: Independently reviewed.  A-fib, new ST/ T wave abnormality in inferolateral leads.   Assessment and Plan  Acute on chronic hypoxic respiratory failure secondary to acute on chronic diastolic CHF She uses 2 L oxygen at home and was satting 84% on arrival to the ED on 2 L, initially required 6 to 7 L oxygen.  Currently stable on 4 L oxygen.  BNP elevated at 690.  CT showing small bilateral pleural effusions but no frank pulmonary edema. Echo done January 2023 showing LVEF 60 to 65%. -Blood pressure currently stable, start IV Lasix 40 mg twice daily -Continue home metoprolol after pharmacy med rec is done. -Monitor intake and output -Daily weights -Low-sodium diet with fluid restriction -Repeat echocardiogram  Chest pain History of CAD EKG showing new ST/T wave abnormality in inferolateral leads.  ACS less likely as high-sensitivity troponin negative x2.  CTA negative for aortic dissection or PE.  Currently chest pain-free and appears comfortable.  CT showing a small pericardial effusion. -Echocardiogram ordered  Pericardial effusion TSH is suppressed, not consistent with hypothyroidism. -Echocardiogram ordered  Congenital deafness -Needs sign language interpretation  CKD stage IIIb Renal function currently stable. -Continue to monitor  Persistent A-fib Currently rate controlled. -Continue Eliquis and metoprolol after pharmacy med rec is done.  Sick sinus syndrome status post PPM  Type 2 diabetes -Check A1c -Sensitive sliding scale insulin ACHS -Pharmacy med rec  pending  Hypothyroidism TSH low at 0.296.   -Unknown what dose of Synthroid she takes at home, pharmacy med rec currently pending.  Mild thrombocytopenia Appears to be chronic.  No signs of active bleeding. -Continue to monitor  Mild elevation of liver enzymes Possibly due to decompensated CHF. -Continue to monitor  PAD Hypertension: Stable. Hyperlipidemia -Pharmacy med rec pending.  DVT prophylaxis:  Continue Eliquis after pharmacy med rec is done. Code Status: DNR/DNI (discussed with the patient) Family Communication: No family available at this time. Level of care: Progressive Care Unit Admission status: It is my clinical opinion that admission to INPATIENT is reasonable and necessary because of the expectation that this patient will require hospital care that crosses at least 2 midnights to treat this condition based on the medical complexity of the problems presented.  Given the aforementioned information, the predictability of an adverse outcome is felt to be significant.   Shela Leff MD [facility]  If 7PM-7AM, please contact night-coverage [email or web address]  05/21/2022, 3:43 AM

## 2022-05-21 NOTE — Progress Notes (Signed)
Mobility Specialist Progress Note    05/21/22 1226  Mobility  Activity Transferred from bed to chair  Level of Assistance Contact guard assist, steadying assist  Assistive Device Other (Comment) (HHA)  Activity Response Tolerated well  $Mobility charge 1 Mobility   Post-Mobility: 71 HR  Orthostatic BPs Supine 109/65 HR 72  Sitting 104/60 HR 78  Standing 122/59 HR 75  Standing after 3 min 123/73 HR 70       Pt received in bed and agreeable. No complaints. Left with family present. In-person interpreter utilized.   Hildred Alamin Mobility Specialist  Primary: 5N M.S. Phone: [phone] Secondary: 6N M.S. Phone: [phone]

## 2022-05-21 NOTE — Progress Notes (Signed)
Heart Failure Nurse Navigator Progress Note  Navigation team following this hospitalization. Screening pending further testing/cardiac workup.   Echo pending  Kevan Rosebush, RN, BSN, [facility] Heart Failure Navigator Heart & Vascular Care Navigation Team

## 2022-05-22 ENCOUNTER — Inpatient Hospital Stay (HOSPITAL_COMMUNITY): Payer: Medicare HMO

## 2022-05-22 DIAGNOSIS — N183 Chronic kidney disease, stage 3 unspecified: Secondary | ICD-10-CM

## 2022-05-22 DIAGNOSIS — G9341 Metabolic encephalopathy: Secondary | ICD-10-CM | POA: Diagnosis not present

## 2022-05-22 DIAGNOSIS — F419 Anxiety disorder, unspecified: Secondary | ICD-10-CM | POA: Diagnosis not present

## 2022-05-22 DIAGNOSIS — H905 Unspecified sensorineural hearing loss: Secondary | ICD-10-CM

## 2022-05-22 DIAGNOSIS — E43 Unspecified severe protein-calorie malnutrition: Secondary | ICD-10-CM | POA: Diagnosis present

## 2022-05-22 DIAGNOSIS — E039 Hypothyroidism, unspecified: Secondary | ICD-10-CM | POA: Diagnosis present

## 2022-05-22 DIAGNOSIS — E119 Type 2 diabetes mellitus without complications: Secondary | ICD-10-CM

## 2022-05-22 DIAGNOSIS — I5033 Acute on chronic diastolic (congestive) heart failure: Secondary | ICD-10-CM | POA: Diagnosis not present

## 2022-05-22 LAB — COMPREHENSIVE METABOLIC PANEL
ALT: 21 U/L (ref 0–44)
AST: 32 U/L (ref 15–41)
Albumin: 2.9 g/dL — ABNORMAL LOW (ref 3.5–5.0)
Alkaline Phosphatase: 142 U/L — ABNORMAL HIGH (ref 38–126)
Anion gap: 7 (ref 5–15)
BUN: 53 mg/dL — ABNORMAL HIGH (ref 8–23)
CO2: 27 mmol/L (ref 22–32)
Calcium: 8.7 mg/dL — ABNORMAL LOW (ref 8.9–10.3)
Chloride: 97 mmol/L — ABNORMAL LOW (ref 98–111)
Creatinine, Ser: 2.01 mg/dL — ABNORMAL HIGH (ref 0.44–1.00)
GFR, Estimated: 24 mL/min — ABNORMAL LOW (ref >60)
Glucose, Bld: 152 mg/dL — ABNORMAL HIGH (ref 70–99)
Potassium: 4.3 mmol/L (ref 3.5–5.1)
Sodium: 131 mmol/L — ABNORMAL LOW (ref 135–145)
Total Bilirubin: 1.4 mg/dL — ABNORMAL HIGH (ref 0.3–1.2)
Total Protein: 6.3 g/dL — ABNORMAL LOW (ref 6.5–8.1)

## 2022-05-22 LAB — CBC
HCT: 34.7 % — ABNORMAL LOW (ref 36.0–46.0)
Hemoglobin: 11.1 g/dL — ABNORMAL LOW (ref 12.0–15.0)
MCH: 28.2 pg (ref 26.0–34.0)
MCHC: 32 g/dL (ref 30.0–36.0)
MCV: 88.3 fL (ref 80.0–100.0)
Platelets: 115 10*3/uL — ABNORMAL LOW (ref 150–400)
RBC: 3.93 MIL/uL (ref 3.87–5.11)
RDW: 18.4 % — ABNORMAL HIGH (ref 11.5–15.5)
WBC: 10.1 10*3/uL (ref 4.0–10.5)
nRBC: 0 % (ref 0.0–0.2)

## 2022-05-22 LAB — GLUCOSE, CAPILLARY
Glucose-Capillary: 117 mg/dL — ABNORMAL HIGH (ref 70–99)
Glucose-Capillary: 147 mg/dL — ABNORMAL HIGH (ref 70–99)
Glucose-Capillary: 157 mg/dL — ABNORMAL HIGH (ref 70–99)
Glucose-Capillary: 109 mg/dL — ABNORMAL HIGH (ref 70–99)

## 2022-05-22 MED ORDER — FUROSEMIDE 10 MG/ML IJ SOLN: 40.0000 mg | Freq: Every day | INTRAMUSCULAR | Status: DC

## 2022-05-22 MED ORDER — FUROSEMIDE 10 MG/ML IJ SOLN
40.0000 mg | Freq: Once | INTRAMUSCULAR | Status: AC
  Administered 2022-05-22: 40 mg via INTRAVENOUS
  Filled 2022-05-22: qty 4

## 2022-05-22 MED ORDER — FUROSEMIDE 10 MG/ML IJ SOLN
40.0000 mg | Freq: Two times a day (BID) | INTRAMUSCULAR | Status: DC
Start: 2022-05-22 — End: 2022-05-22

## 2022-05-22 MED ORDER — ALPRAZOLAM 0.5 MG PO TABS
0.5000 mg | ORAL_TABLET | Freq: Three times a day (TID) | ORAL | Status: DC | PRN
  Administered 2022-05-22: 0.5 mg via ORAL
  Filled 2022-05-22: qty 1

## 2022-05-22 NOTE — Progress Notes (Signed)
Called regarding increased oxygen requirement, O2 saturation now in upper 80s on 6 Lpm with RR 30 in this pt admitted with acute on chronic HFpEF. Plan to give an extra dose Lasix now.

## 2022-05-22 NOTE — TOC Initial Note (Signed)
Transition of Care [facility]) - Initial/Assessment Note    Patient Details  Name: Veronica Young MRN: [id number] Date of Birth: 1933/06/21  [facility]) CM/SW Contact:    Bethena Roys, RN Phone Number: 05/22/2022, 12:48 PM  Clinical Narrative:  Case Manager met the patient and the interpreter at the bedside to discuss disposition needs. PTA patient is from home with her daughter [name]. Per interpreter, patient has rolling walker, cane, and oxygen in the home (2) liters. Patient does not currently have home health services- Case Manager will continue to follow for disposition needs as the patient progresses.                  Expected Discharge Plan: Home/Self Care Barriers to Discharge: Continued Medical Work up   Patient Goals and CMS Choice Patient states their goals for this hospitalization and ongoing recovery are:: to return home with daughter.      Expected Discharge Plan and Services Expected Discharge Plan: Home/Self Care In-house Referral: NA Discharge Planning Services: CM Consult Post Acute Care Choice: NA Living arrangements for the past 2 months: Single Family Home                   DME Agency: NA     Prior Living Arrangements/Services Living arrangements for the past 2 months: Single Family Home Lives with:: Adult Children Patient language and need for interpreter reviewed:: Yes (Interpreter at the bedside.) Do you feel safe going back to the place where you live?: Yes      Need for Family Participation in Patient Care: Yes (Comment) Care giver support system in place?: Yes (comment) Current home services: DME (patient has cane, rolling walker, oxygen at 2 liters,) Criminal Activity/Legal Involvement Pertinent to Current Situation/Hospitalization: No - Comment as needed  Activities of Daily Living Home Assistive Devices/Equipment: None ADL Screening (condition at time of admission) Patient's cognitive ability adequate to safely complete  daily activities?: Yes Is the patient deaf or have difficulty hearing?: Yes Does the patient have difficulty seeing, even when wearing glasses/contacts?: Yes Does the patient have difficulty concentrating, remembering, or making decisions?: No Patient able to express need for assistance with ADLs?: No Does the patient have difficulty dressing or bathing?: No Independently performs ADLs?: Yes (appropriate for developmental age) Does the patient have difficulty walking or climbing stairs?: No Weakness of Legs: None Weakness of Arms/Hands: None  Permission Sought/Granted Permission sought to share information with : Family Supports, Case Manager   Emotional Assessment Appearance:: Appears stated age Attitude/Demeanor/Rapport: Engaged Affect (typically observed): Appropriate   Alcohol / Substance Use: Not Applicable Psych Involvement: No (comment)  Admission diagnosis:  Shortness of breath [R06.02] Pericardial effusion [I31.39] Pleural effusion [J90] Hypoxia [R09.02] Acute CHF (congestive heart failure) ([place]) [I50.9] Nonspecific chest pain [R07.9] Patient Active Problem List   Diagnosis Date Noted   Protein-calorie malnutrition, severe 05/22/2022   Anxiety 05/22/2022   T2DM (type 2 diabetes mellitus) ([place]) 05/22/2022   Hypothyroidism 05/22/2022   Congenital deafness 05/21/2022   Depression 01/17/2022   Hyponatremia 01/16/2022   Chronic respiratory failure with hypoxia ([place]) - has home O2 01/14/2022   Malnutrition of moderate degree 01/11/2022   Acute kidney injury superimposed on chronic kidney disease ([place]) 05/24/2020   Acute on chronic diastolic CHF (congestive heart failure) ([place]) 04/02/2020   Acute on chronic respiratory failure with hypoxia ([place]) 04/02/2020   CAD in native artery 06/16/2017   AF (paroxysmal atrial fibrillation) ([place]) 06/16/2017   Sinus node dysfunction ([place])  09/10/2016   HTN (hypertension) 09/22/2014   Hypertensive urgency 09/15/2014   Chest pain  09/15/2014   Chronic diastolic CHF (congestive heart failure) ([place]) 11/27/2012   PAD (peripheral artery disease) ([place]) 03/18/2012   Claudication ([place]) 02/19/2012   Atrial tachycardia ([place]) 06/27/2011   Hypercholesterolemia    Deafness    Carotid bruit    GERD (gastroesophageal reflux disease)    PCP:  Katherina Mires, MD Pharmacy:   [id number] [place] [place]Phone: [phone]: [phone] [id number] [address] [place] [place]one: [phone]: [phone] [address]: [phone]: [phone] Readmission Risk Interventions     View : No data to display.

## 2022-05-22 NOTE — Progress Notes (Addendum)
Progress Note  Patient Name: Veronica Young Date of Encounter: 05/22/2022  [facility] Cardiologist: Peter [name], MD   Subjective   Sitting up sleeping in the bed. Received xanax this morning. Unable to use the sign language interpretor at this time due to her sleepiness.  Inpatient Medications    Scheduled Meds:  apixaban  2.5 mg Oral BID   brimonidine  1 drop Both Eyes BID   escitalopram  10 mg Oral Daily   feeding supplement  237 mL Oral TID BM   furosemide  40 mg Intravenous Daily   gabapentin  100 mg Oral QHS   insulin aspart  0-5 Units Subcutaneous QHS   insulin aspart  0-9 Units Subcutaneous TID WC   levothyroxine  100 mcg Oral Q0600   midodrine  10 mg Oral BID WC   Continuous Infusions:  PRN Meds: acetaminophen **OR** acetaminophen, ALPRAZolam   Vital Signs    Vitals:   05/21/22 1217 05/21/22 1931 05/22/22 0042 05/22/22 0432  BP: (!) 122/59 (!) 105/57 94/83 124/62  Pulse: 75 69 75 87  Resp: (!) 27 13 14 19   Temp:  97.8 F (36.6 C)  98.7 F (37.1 C)  TempSrc:  Oral  Oral  SpO2: 97% 99% 96% 96%  Weight:    52.2 kg  Height:        Intake/Output Summary (Last 24 hours) at 05/22/2022 1104 Last data filed at 05/22/2022 0441 Gross per 24 hour  Intake 420 ml  Output 200 ml  Net 220 ml      05/22/2022    4:32 AM 05/20/2022   11:45 PM 05/20/2022    3:58 PM  Last 3 Weights  Weight (lbs) 115 lb 1.6 oz 113 lb 12.8 oz 113 lb  Weight (kg) 52.209 kg 51.619 kg 51.256 kg      Telemetry    Afib rates 80-90s - Personally Reviewed  ECG    No new tracing  Physical Exam   GEN: No acute distress.  Frail older female, on [place] Neck: No JVD Cardiac: RRR, + systolic murmur, no rubs, or gallops.  Respiratory: Crackles in bases  GI: Soft, nontender, non-distended  MS: No edema; No deformity. Neuro:  Nonfocal  Psych: Normal affect   Labs    High Sensitivity Troponin:   Recent Labs  Lab 05/20/22 1742 05/20/22 1942  TROPONINIHS 15 16      Chemistry Recent Labs  Lab 05/20/22 1742 05/21/22 0647 05/22/22 0249  NA 135 135 131*  K 3.9 3.8 4.3  CL 93* 95* 97*  CO2 31 31 27   GLUCOSE 122* 110* 152*  BUN 38* 40* 53*  CREATININE 1.53* 1.76* 2.01*  CALCIUM 9.0 9.0 8.7*  MG 2.1  --   --   PROT 7.4 6.7 6.3*  ALBUMIN 3.6 3.1* 2.9*  AST 43* 38 32  ALT 23 22 21   ALKPHOS 163* 149* 142*  BILITOT 2.0* 1.9* 1.4*  GFRNONAA 33* 28* 24*  ANIONGAP 11 9 7     Lipids No results for input(s): CHOL, TRIG, HDL, LABVLDL, LDLCALC, CHOLHDL in the last 168 hours.  Hematology Recent Labs  Lab 05/20/22 1742 05/21/22 0647 05/22/22 0249  WBC 8.9 9.1 10.1  RBC 4.47 4.26 3.93  HGB 12.7 12.1 11.1*  HCT 39.5 37.2 34.7*  MCV 88.4 87.3 88.3  MCH 28.4 28.4 28.2  MCHC 32.2 32.5 32.0  RDW 18.3* 18.3* 18.4*  PLT 111* 102* 115*   Thyroid  Recent Labs  Lab 05/20/22 1742  TSH  0.296*    BNP Recent Labs  Lab 05/20/22 1742  BNP 690.5*    DDimer No results for input(s): DDIMER in the last 168 hours.   Radiology    DG Chest Portable 1 View  Result Date: 05/20/2022 CLINICAL DATA:  Pleuritic sharp pain to back. EXAM: PORTABLE CHEST 1 VIEW pleuritic sharp stabbing pain to back. COMPARISON:  AP chest 01/13/2022 and 11/13/2020; CT chest 11/14/2020 FINDINGS: Left chest wall cardiac pacer is seen with leads overlying right atrium and right ventricle. Cardiac silhouette is again mildly to moderately enlarged. Mediastinal contours are within normal limits. There is unchanged mild bilateral interstitial thickening, greatest within the lung bases. Cystic lucencies are again seen within the upper lungs suggesting cystic emphysematous change. No definite pleural effusion. No pneumothorax. Mild levocurvature of the thoracic spine with mild multilevel degenerative disc changes. IMPRESSION: No significant change from 01/13/2022. Cardiomegaly with mild interstitial pulmonary edema. Electronically Signed   By: Yvonne Kendall M.D.   On: 05/20/2022 18:15    ECHOCARDIOGRAM COMPLETE  Result Date: 05/21/2022    ECHOCARDIOGRAM REPORT   Patient Name:   Veronica Young Veronica Young Date of Exam: 05/21/2022 Medical Rec #:  [id number]      Height:       65.0 in Accession #:    [id number]     Weight:       113.8 lb Date of Birth:  [birth year]     BSA:          1.557 m Patient Age:    88 years       BP:           115/64 mmHg Patient Gender: F              HR:           72 bpm. Exam Location:  Inpatient Procedure: 2D Echo, Cardiac Doppler and Color Doppler Indications:    CHF  History:        Patient has prior history of Echocardiogram examinations, most                 recent 01/14/2022. CHF, Pacemaker; Risk Factors:Diabetes and                 Dyslipidemia.  Sonographer:    Luisa Hart RDCS Referring Phys: [id number] Shela Leff  Sonographer Comments: 07/26/2014 pacemaker IMPRESSIONS  1. Compared to echo from Jan 2023, no significant change in LVEF and RVEF.  2. Left ventricular ejection fraction, by estimation, is >75%. The left ventricle has hyperdynamic function. The left ventricle has no regional wall motion abnormalities. There is severe concentric left ventricular hypertrophy. Left ventricular diastolic parameters are indeterminate.  3. Right ventricular systolic function is moderately reduced. The right ventricular size is moderately enlarged.  4. Left atrial size was severely dilated.  5. Right atrial size was severely dilated.  6. The mitral valve is normal in structure. Mild mitral valve regurgitation.  7. The aortic valve is tricuspid. Aortic valve regurgitation is not visualized. Aortic valve sclerosis is present, with no evidence of aortic valve stenosis.  8. The inferior vena cava is dilated in size with <50% respiratory variability, suggesting right atrial pressure of 15 mmHg. FINDINGS  Left Ventricle: Left ventricular ejection fraction, by estimation, is >75%. The left ventricle has hyperdynamic function. The left ventricle has no regional wall motion abnormalities. The  left ventricular internal cavity size was small. There is severe concentric left ventricular hypertrophy. Left ventricular diastolic parameters are indeterminate. Right Ventricle:  The right ventricular size is moderately enlarged. Right vetricular wall thickness was not assessed. Right ventricular systolic function is moderately reduced. Left Atrium: Left atrial size was severely dilated. Right Atrium: Right atrial size was severely dilated. Pericardium: Trivial pericardial effusion is present. Mitral Valve: The mitral valve is normal in structure. Mild mitral valve regurgitation. Tricuspid Valve: The tricuspid valve is normal in structure. Tricuspid valve regurgitation is mild. Aortic Valve: The aortic valve is tricuspid. Aortic valve regurgitation is not visualized. Aortic valve sclerosis is present, with no evidence of aortic valve stenosis. Aortic valve mean gradient measures 2.3 mmHg. Aortic valve peak gradient measures 4.4  mmHg. Aortic valve area, by VTI measures 3.46 cm. Pulmonic Valve: The pulmonic valve was normal in structure. Pulmonic valve regurgitation is not visualized. Aorta: The aortic root and ascending aorta are structurally normal, with no evidence of dilitation. Venous: The inferior vena cava is dilated in size with less than 50% respiratory variability, suggesting right atrial pressure of 15 mmHg. IAS/Shunts: No atrial level shunt detected by color flow Doppler. Additional Comments: A device lead is visualized.  LEFT VENTRICLE PLAX 2D LVIDd:         3.20 cm     Diastology LVIDs:         1.80 cm     LV e' medial:    5.56 cm/s LV PW:         1.50 cm     LV E/e' medial:  17.2 LV IVS:        1.80 cm     LV e' lateral:   7.37 cm/s LVOT diam:     2.30 cm     LV E/e' lateral: 13.0 LV SV:         65 LV SV Index:   42 LVOT Area:     4.15 cm  LV Volumes (MOD) LV vol d, MOD A2C: 23.7 ml LV vol d, MOD A4C: 24.9 ml LV vol s, MOD A2C: 11.9 ml LV vol s, MOD A4C: 10.1 ml LV SV MOD A2C:     11.8 ml LV SV MOD  A4C:     24.9 ml LV SV MOD BP:      13.6 ml RIGHT VENTRICLE RV Basal diam:  3.50 cm RV Mid diam:    1.80 cm RV S prime:     7.74 cm/s TAPSE (M-mode): 1.3 cm LEFT ATRIUM             Index        RIGHT ATRIUM           Index LA Vol (A2C):   40.5 ml 26.02 ml/m  RA Area:     22.20 cm LA Vol (A4C):   66.2 ml 42.53 ml/m  RA Volume:   67.20 ml  43.17 ml/m LA Biplane Vol: 55.2 ml 35.46 ml/m  AORTIC VALVE                    PULMONIC VALVE AV Area (Vmax):    3.34 cm     PV Vmax:       0.74 m/s AV Area (Vmean):   3.24 cm     PV Vmean:      51.575 cm/s AV Area (VTI):     3.46 cm     PV VTI:        0.134 m AV Vmax:           104.67 cm/s  PV Peak grad:  2.2 mmHg AV Vmean:  71.233 cm/s  PV Mean grad:  1.0 mmHg AV VTI:            0.187 m AV Peak Grad:      4.4 mmHg AV Mean Grad:      2.3 mmHg LVOT Vmax:         84.20 cm/s LVOT Vmean:        55.500 cm/s LVOT VTI:          0.156 m LVOT/AV VTI ratio: 0.83  AORTA Ao Root diam: 2.90 cm Ao Asc diam:  3.50 cm MV E velocity: 95.67 cm/s  TRICUSPID VALVE                            TR Peak grad:   68.9 mmHg                            TR Vmax:        415.00 cm/s                             SHUNTS                            Systemic VTI:  0.16 m                            Systemic Diam: 2.30 cm Dorris Carnes MD Electronically signed by Dorris Carnes MD Signature Date/Time: 05/21/2022/10:53:17 AM    Final    CT Angio Chest/Abd/Pel for Dissection W and/or Wo Contrast  Result Date: 05/20/2022 CLINICAL DATA:  Chest and back pain. EXAM: CT ANGIOGRAPHY CHEST, ABDOMEN AND PELVIS TECHNIQUE: Non-contrast CT of the chest was initially obtained. Multidetector CT imaging through the chest, abdomen and pelvis was performed using the standard protocol during bolus administration of intravenous contrast. Multiplanar reconstructed images and MIPs were obtained and reviewed to evaluate the vascular anatomy. RADIATION DOSE REDUCTION: This exam was performed according to the departmental  dose-optimization program which includes automated exposure control, adjustment of the mA and/or kV according to patient size and/or use of iterative reconstruction technique. CONTRAST:  30mL OMNIPAQUE IOHEXOL 350 MG/ML SOLN COMPARISON:  CT chest 11/14/2020 FINDINGS: CTA CHEST FINDINGS Cardiovascular: Heart is moderately enlarged, unchanged. There is a new small pericardial effusion. There is adequate opacification of the thoracic aorta. There is no evidence for aortic dissection or aneurysm. There is calcified atherosclerotic disease throughout the aorta. Left-sided pacemaker is present with leads in the right atrium and right ventricle. Mediastinum/Nodes: No enlarged mediastinal, hilar, or axillary lymph nodes. Thyroid gland, trachea, and esophagus demonstrate no significant findings. Lungs/Pleura: There are trace bilateral pleural effusions scratch at trace right and small left pleural effusions are present. There is a small amount of atelectasis in the bilateral lower lobes, right middle lobe and lingula. There is a calcified granuloma in the right lung apex, unchanged. Pneumothorax. Musculoskeletal: No acute fracture. Review of the MIP images confirms the above findings. CTA ABDOMEN AND PELVIS FINDINGS VASCULAR Aorta: Normal caliber aorta without aneurysm, dissection, vasculitis or significant stenosis. There are atherosclerotic calcifications of the aorta. Celiac: Patent without evidence of aneurysm, dissection, vasculitis or significant stenosis. SMA: Patent without evidence of aneurysm, dissection, vasculitis or significant stenosis. Renals: There is moderate stenosis at the origin of the right renal artery.  Left renal artery is within normal limits. IMA: Patent without evidence of aneurysm, dissection, vasculitis or significant stenosis. Inflow: Patent without evidence of aneurysm, dissection, vasculitis or significant stenosis. Veins: No obvious venous abnormality within the limitations of this arterial  phase study. Review of the MIP images confirms the above findings. NON-VASCULAR Hepatobiliary: No focal liver abnormality is seen. Status post cholecystectomy. No biliary dilatation. Pancreas: Unremarkable. No pancreatic ductal dilatation or surrounding inflammatory changes. Spleen: Normal in size without focal abnormality. Adrenals/Urinary Tract: Renal cortical hypodensities are seen in both kidneys which are too small to characterize, likely cysts. There is no hydronephrosis or perinephric fluid. The adrenal glands and bladder are within normal limits. Stomach/Bowel: Stomach is within normal limits. No evidence of bowel wall thickening, distention, or inflammatory changes. Appendix is not seen. There is sigmoid colon diverticulosis without evidence for acute diverticulitis. Lymphatic: No enlarged lymph nodes are seen. Reproductive: Uterus and bilateral adnexa are unremarkable. Other: No abdominal wall hernia or abnormality. Small amount of free fluid in the pelvis. Musculoskeletal: There is scoliosis and degenerative change throughout the lumbar spine. Review of the MIP images confirms the above findings. IMPRESSION: 1. No evidence for aortic dissection or aneurysm. 2. Moderate cardiomegaly with small pericardial effusion. 3. Small bilateral pleural effusions. 4. Small amount of free fluid in the pelvis of uncertain etiology. 5. Sigmoid colon diverticulosis. 6.  Aortic Atherosclerosis (ICD10-I70.0). Electronically Signed   By: Ronney Asters M.D.   On: 05/20/2022 19:37    Cardiac Studies   Echo: 05/21/22  IMPRESSIONS     1. Compared to echo from Jan 2023, no significant change in LVEF and  RVEF.   2. Left ventricular ejection fraction, by estimation, is >75%. The left  ventricle has hyperdynamic function. The left ventricle has no regional  wall motion abnormalities. There is severe concentric left ventricular  hypertrophy. Left ventricular  diastolic parameters are indeterminate.   3. Right  ventricular systolic function is moderately reduced. The right  ventricular size is moderately enlarged.   4. Left atrial size was severely dilated.   5. Right atrial size was severely dilated.   6. The mitral valve is normal in structure. Mild mitral valve  regurgitation.   7. The aortic valve is tricuspid. Aortic valve regurgitation is not  visualized. Aortic valve sclerosis is present, with no evidence of aortic  valve stenosis.   8. The inferior vena cava is dilated in size with <50% respiratory  variability, suggesting right atrial pressure of 15 mmHg.   FINDINGS   Left Ventricle: Left ventricular ejection fraction, by estimation, is  >75%. The left ventricle has hyperdynamic function. The left ventricle has  no regional wall motion abnormalities. The left ventricular internal  cavity size was small. There is severe  concentric left ventricular hypertrophy. Left ventricular diastolic  parameters are indeterminate.   Right Ventricle: The right ventricular size is moderately enlarged. Right  vetricular wall thickness was not assessed. Right ventricular systolic  function is moderately reduced.   Left Atrium: Left atrial size was severely dilated.   Right Atrium: Right atrial size was severely dilated.   Pericardium: Trivial pericardial effusion is present.   Mitral Valve: The mitral valve is normal in structure. Mild mitral valve  regurgitation.   Tricuspid Valve: The tricuspid valve is normal in structure. Tricuspid  valve regurgitation is mild.   Aortic Valve: The aortic valve is tricuspid. Aortic valve regurgitation is  not visualized. Aortic valve sclerosis is present, with no evidence of  aortic valve  stenosis. Aortic valve mean gradient measures 2.3 mmHg.  Aortic valve peak gradient measures 4.4   mmHg. Aortic valve area, by VTI measures 3.46 cm.   Pulmonic Valve: The pulmonic valve was normal in structure. Pulmonic valve  regurgitation is not visualized.    Aorta: The aortic root and ascending aorta are structurally normal, with  no evidence of dilitation.   Venous: The inferior vena cava is dilated in size with less than 50%  respiratory variability, suggesting right atrial pressure of 15 mmHg.   IAS/Shunts: No atrial level shunt detected by color flow Doppler.   Additional Comments: A device lead is visualized.   Patient Profile     88 y.o. female with a hx of congenital deafness, chronic diastolic CHF, persistent atrial fibrillation on eliquis, paroxysmal atrial tachycardia, sick sinus syndrome s/p [employer] PPM, CAD, PAD, HTN, HLD, type 2 DM, hypothyroidism CKD stage IIIb who is being seen 05/21/2022 for the evaluation of CHF, chest pain, atrial fibrillation at the request of Dr. Broadus John.  Assessment & Plan    Acute on Chronic hypoxic respiratory failure/Acute on Chronic diastolic heart failure: BNP was elevated to 690.5, CXR with cardiomegaly and mild intersitial pulmonary edema/CT chest with small bilateral effusions. Echo showed LVEF of >75%, LVH, moderately reduced RV, severe biatrial enlargement. -- currently on IV lasix 40mg  daily, I&O incomplete/has been incontinent. Will hold dose for today with rise in Cr to 2 -- Strict I/Os, daily weights  -- Monitor electrolytes and renal function during diuresis    Small Pericardial Effusion: Noted on CTA chest/abd/pelvis  -- trivial pericardial effusion on echocardiogram, suspect chronic  CKD stage III: Cr baseline around 1.5-1.7, up to 2.01 this morning -- holding dose of lasix today, suspect low BP contributed to rise -- follow BMET   Chest Pain: reported having chest tightness when she deeply breathes. Denied any positional chest pain.  -- hsTn negative   Persistent A-Fib: Continue eliquis 2.5 mg BID (dose adjusted due to age, weight). Severe biatrial enlargement on echo -- BB was held on admission 2/2 soft blood pressures, remains rate control -- on midodrine 10mg  BID  Otherwise  per primary  - Type 2 DM  - Hypothyroidism  - Mild thrombocytopenia  - Mild elevation in liver enzymes  - PAD   For questions or updates, please contact [facility] Please consult [email or web address] for contact info under        Signed, Reino Bellis, NP  05/22/2022, 11:04 AM    ATTENDING ATTESTATION:  After conducting a review of all available clinical information with the care team, interviewing the patient, and performing a physical exam, I agree with the findings and plan described in this note.   GEN: Sleeping   HEENT:  MMM, no JVD, no scleral icterus Cardiac: Irregular RR, no murmurs, rubs, or gallops.  Respiratory: Clear to auscultation bilaterally. GI: Soft, nontender, non-distended  MS: No edema; No deformity. Neuro:  Nonfocal  Vasc:  +2 radial pulses  Patient diary was not negative.  Despite this her creatinine has increased to 2.0.  She looks euvolemic if not dry on exam.  We will hold Lasix for now.  Her atrial fibrillation rate seems well controlled.   Her echocardiogram demonstrates hyperdynamic LV function with severe LVH, severe biatrial enlargement, and no significant valvular disease.  When able, will start Jardiance for diastolic dysfunction.  Lenna Sciara, MD Pager [phone]

## 2022-05-22 NOTE — Assessment & Plan Note (Signed)
Continue with nutritional supplements.  

## 2022-05-22 NOTE — Assessment & Plan Note (Addendum)
Acute on chronic hypoxemic respiratory failure due to acute cardiogenic pulmonary edema.   Echocardiogram with LV EF preserved >75% with severe concentric LVH, moderate reduction in RV systolic function, moderate enlargement in RV cavity, severe bilateral atrial enlargement. Trivial pericardial effusion.   Acute on chronic core pulmonale,  I suspect primary pulmonary hypertension, related to rheumatoid arthritis.   Follow up chest CT with signs of pulmonary edema and bilateral pleural effusion.   Documented urine output is 400, but clinically patient has improved with decreased 02 requirements this am.   02 saturation 96% on 7 L/min per [facility].   Plan to continue diuresis with IV furosemide to further target negative fluid balance.  Continue oxymetry monitoring and supplemental 02 per [facility] to keep 02 saturation 88% or greater.   Blood pressure support with midodrine.

## 2022-05-22 NOTE — Assessment & Plan Note (Signed)
Patient on chronic 02 supplementation at home. This am using 2 L/min per [facility] [place].

## 2022-05-22 NOTE — Assessment & Plan Note (Signed)
Possible musculoskeletal Patient has distal interphalangeal deformities, possible RA.  Continue with acetaminophen for pain control and will add tramadol.  Out of bed to chair tid with meals.

## 2022-05-22 NOTE — Progress Notes (Signed)
Upon entering the patient's room the patient was lethargic, drooling and a small lip droop that was not present on admission. A NIH was completed and right sided weakness was found. Rapid Response was called and Opyd MD was notified. Family is at the bedside.

## 2022-05-22 NOTE — Assessment & Plan Note (Signed)
Fasting glucose this am is 88 mg/dl.  Capillary glucose 83, 152, 155.  Plan to discontinue insulin therapy and continue capillary glucose monitoring as needed.  Patient is tolerating po well.

## 2022-05-22 NOTE — Consult Note (Signed)
Neurology Consultation Reason for Consult: Fall Requesting Physician: Sander Radon  CC: Fall, unwitnessed, c/f right sided weakness and possible aphasia  History is obtained from: Patient w/ help of family at bedside, nursing and chart review  HPI: Veronica Young is a 88 y.o. female with  past medical history significant for deafness (ASL), atrial fibrillation on apixaban, coronary artery disease, hypertension, hyperlipidemia, type 2 diabetes, congestive heart failure, CAD with pacemaker in place, lumbago, AKI on CKD, glaucoma (blind in the left eye),  She presented with pleuritic chest discomfort, increased oxygen requirement, found to have acute on chronic diastolic heart failure with acute on chronic hypoxic respiratory failure for which she was admitted to the hospital.  After receiving Xanax this morning, she was very somnolent, and subsequently she had a unwitnessed fall this evening.  She was noted to be slightly weak on the light side after this fall with concern for potential aphasia.  At baseline family at bedside reports she is quite independent and only stopped driving after her last hospitalization due to requiring oxygen continuously.  Family notes that she was very sleepy initially after the Xanax, but this has been improving through the course of the day, and her ability to use ASL has also been steadily improving throughout the day although she is still somewhat slower to sign than she normally would be.  They also feel this is similar to other events that she has had with sedating medication.  LKW: 8:55 AM tPA given?: No, on apixaban Premorbid modified rankin scale:      2 - Slight disability. Able to look after own affairs without assistance, but unable to carry out all previous activities.  ROS: Limited review of systems in the setting of her sleepiness  Past Medical History:  Diagnosis Date   Carotid bruit    CHF (congestive heart failure) (HCC)    Deafness     Diabetes mellitus    type 2   Diastolic heart failure (HCC)    EF is 60%   GERD (gastroesophageal reflux disease)    Glaucoma    Hypercholesterolemia    Hypertension    Hypothyroidism    Normal cardiac stress test January 2014   PAD (peripheral artery disease) ([place])    a. known bilateral popliteal occlusions with conservative management favored.   PAT (paroxysmal atrial tachycardia) (HCC)    Sick sinus syndrome [facility])    a. s/p PPM placement 07/2014   Past Surgical History:  Procedure Laterality Date   cholycystectomy     left breast cyst removed     PACEMAKER INSERTION  07-26-2014   MDT ADDRL1 pacemaker implanted by Dr Lovena Le for [place] N/A 07/26/2014   Procedure: PERMANENT PACEMAKER INSERTION;  Surgeon: Evans Lance, MD;  Location: [facility] CATH LAB;  Service: Cardiovascular;  Laterality: N/A;   TOTAL KNEE ARTHROPLASTY     right    Current Facility-Administered Medications:    acetaminophen (TYLENOL) tablet 650 mg, 650 mg, Oral, Q6H PRN, 650 mg at 05/21/22 1207 **OR** acetaminophen (TYLENOL) suppository 650 mg, 650 mg, Rectal, Q6H PRN, Shela Leff, MD   apixaban (ELIQUIS) tablet 2.5 mg, 2.5 mg, Oral, BID, Domenic Polite, MD, 2.5 mg at 05/22/22 0855   brimonidine (ALPHAGAN) 0.15 % ophthalmic solution 1 drop, 1 drop, Both Eyes, BID, Domenic Polite, MD, 1 drop at 05/22/22 2242   escitalopram (LEXAPRO) tablet 10 mg, 10 mg, Oral, Daily, Domenic Polite, MD, 10 mg at 05/22/22 0855   feeding supplement (  ENSURE ENLIVE / ENSURE PLUS) liquid 237 mL, 237 mL, Oral, TID BM, Domenic Polite, MD, 237 mL at 05/21/22 2205   gabapentin (NEURONTIN) capsule 100 mg, 100 mg, Oral, QHS, Domenic Polite, MD, 100 mg at 05/21/22 2205   insulin aspart (novoLOG) injection 0-5 Units, 0-5 Units, Subcutaneous, QHS, Rathore, Vasundhra, MD   insulin aspart (novoLOG) injection 0-9 Units, 0-9 Units, Subcutaneous, TID WC, Shela Leff, MD, 2 Units at 05/22/22 1752    levothyroxine (SYNTHROID) tablet 100 mcg, 100 mcg, Oral, Q0600, Domenic Polite, MD, 100 mcg at 05/22/22 4656   midodrine (PROAMATINE) tablet 10 mg, 10 mg, Oral, BID WC, Domenic Polite, MD, 10 mg at 05/22/22 1753   Family History  Problem Relation Age of Onset   Heart disease Mother    Liver cancer Father    Heart disease Sister    Heart disease Brother    Stroke Sister    Social History:  reports that she has never smoked. She has never used smokeless tobacco. She reports that she does not drink alcohol and does not use drugs.  Exam: Current vital signs: BP 133/75   Pulse 78   Temp 98.7 F (37.1 C) (Oral)   Resp (!) 30   Ht 5\' 5"  (1.651 m)   Wt 52.2 kg   SpO2 93%   BMI 19.15 kg/m  Vital signs in last 24 hours: Temp:  [98.7 F (37.1 C)] 98.7 F (37.1 C) (05/31 0432) Pulse Rate:  [75-87] 78 (05/31 1856) Resp:  [14-30] 30 (05/31 1856) BP: (94-133)/(62-83) 133/75 (05/31 1856) SpO2:  [93 %-96 %] 93 % (05/31 1856) Weight:  [52.2 kg] 52.2 kg (05/31 0432)   Physical Exam  Constitutional: Thin, frail and chronically ill-appearing Psych: Affect appropriate to situation, pleasant and cooperative Eyes: Left eye chronically blind HENT: Oxygen in place, required nonrebreather due to fluctuating oxygen saturations MSK: no joint deformities.  No tenderness to palpation of the cervical spine.  Patient has stable mildly limited range of motion of cervical spine movement without any acute pain with movement Cardiovascular: Irregularly irregular rhythm on the monitor Respiratory: Effort normal, non-labored breathing GI: Soft.  No distension. There is no tenderness.  Skin: Warm dry and intact visible skin  Neuro: Mental Status: Sleepy but alerts to minimal stimulation oriented to age, month.  Able to speak a few words in a dysarthric (more than normally dysarthric) voice.  Somewhat poor attention Cranial Nerves: II: Visual Fields are full in the right eye.  Right eye pupil is  round III,IV, VI: EOMI in the right eye V: Facial sensation is symmetric  VII: Facial movement is symmetric.  VIII: Deaf at baseline, reads lips and uses sign language X: Uvula elevates symmetrically XI: Shoulder shrug is symmetric. XII: tongue is midline without atrophy or fasciculations.  Motor: Tone is normal. Bulk is normal.  Mild pronation without drift in the bilateral upper extremities.  Drift in the bilateral lower extremities.  Limited confrontational strength exam in the setting of her significant hypoxia even at rest Sensory: Reports this is equal in all 4 extremities Cerebellar: FNF intact bilaterally Gait:  Deferred   NIHSS total 4 Score breakdown: One-point for left lower extremity weakness, one-point for right lower extremity weakness, one-point for dysarthria, one-point for mild aphasia (patient is slow to sign per family) Performed at 9:15 PM   I have reviewed labs in epic and the results pertinent to this consultation are:  Basic Metabolic Panel: Recent Labs  Lab 05/20/22 1742 05/21/22 0647 05/22/22 0249  NA 135 135 131*  K 3.9 3.8 4.3  CL 93* 95* 97*  CO2 31 31 27   GLUCOSE 122* 110* 152*  BUN 38* 40* 53*  CREATININE 1.53* 1.76* 2.01*  CALCIUM 9.0 9.0 8.7*  MG 2.1  --   --     CBC: Recent Labs  Lab 05/20/22 1742 05/21/22 0647 05/22/22 0249  WBC 8.9 9.1 10.1  NEUTROABS 6.9  --   --   HGB 12.7 12.1 11.1*  HCT 39.5 37.2 34.7*  MCV 88.4 87.3 88.3  PLT 111* 102* 115*    Coagulation Studies: No results for input(s): LABPROT, INR in the last 72 hours.    I have reviewed the images obtained:   Head CT personally reviewed, agree with radiology no evidence of acute intracranial process  Impression: Encephalopathy in the setting of Xanax exposure, AKI, acute on chronic decompensated congestive heart failure and acute on chronic decompensated respiratory failure.  By the time of my evaluation, her right-sided weakness initially noted by nurse at  bedside had improved, suspect pain secondary to fall.  No focal neurological deficits to suggest acute stroke at this time, her slowness to speak seems more toxic/metabolic given the history provided by family with gradual improvement over the course of the day.  Nevertheless, given that she is on high risk medication (apixaban), will obtain head CT to rule out bleed or stroke  Initial recommendations: -Head CT -If head CT is negative, would continue apixaban  Further recommendations: -MRI brain only if patient is not fully back to baseline by tomorrow -Initiate stroke work-up only if MRI brain is positive -Neurology will be available on an as-needed basis going forward, please consult if new questions or concerns arise, including if MRI brain demonstrates acute process   Lesleigh Noe MD-PhD [facility] [phone] Available 7 PM to 7 AM, outside of these hours please call Neurologist on call as listed on Amion.

## 2022-05-22 NOTE — Significant Event (Signed)
Rapid Response Event Note   Reason for Call :  Drooling, slurred speech, R leg weakness, [id number].  Pt received xanax this AM at 0855. Per the family, she has been very sleepy and not quite herself since then. Pt also suffered a fall around 1830 tonight. She fell on her R side per nursing staff.   Initial Focused Assessment:  Pt lying in bed with eyes closed. She is leaning towards her R side. She is hard of hearing. She is easily arousable when you tap her on her shoulder. She has a R facial droop, R arm and leg weakness, and questionable aphasia, NIH-4. Per family she doesn't appear to be understanding them as well when they sign to her(ASL).   HR-70, BP-125/67, RR-20, SpO2-93% on 5L [place].    Interventions:  CBG-109 CT head STAT to r/o bleed-no hemorrhage or LVO.  Plan of Care:  CT head negative. Please do stroke swallow screen prior to giving meds. If symptoms still present in AM, pt may need MRI.   Event Summary:   MD Notified: Dr. Myna Hidalgo, Dr. Curly [place] Call [phone] Arrival [phone] End [id number]  Dillard Essex, RN

## 2022-05-22 NOTE — Assessment & Plan Note (Addendum)
CKD stage 3b. Hyponatremia.   Renal function with serum cr at 2,0 with K at 4,3 and serum bicarbonate at 27, Na is 131

## 2022-05-22 NOTE — Progress Notes (Addendum)
Progress Note   Patient: Veronica Young HUT:[id number] DOB: 1933/06/18 DOA: 05/20/2022     2 DOS: the patient was seen and examined on 05/22/2022   Brief hospital course: Mrs. Roadcap was admitted to the hospital with the working diagnosis of chest pain, complicated with congested heart failure.   88 yo female with congential deafness, CKD stage 3b, diastolic heart failure, atrial fibrillation, sick sinus syndrome sp PPM, coronary artery disease, hypertension, dyslipidemia and T2DM who presented with chest pain. Reported constant chest pain, upper back and upper chest, not exertion related and worse with vomiting. In the Ed her 02 saturation was 84% on 2 L/min per [place], her blood pressure 104/55, HR 80, RR 26, lungs with no wheezing, heart with S1 and S2 present with no gallops, abdomen not distended and positive lower extremity edema.   Na 135, K 3,9, Cl 93, bicarbonate 31, glucose 122, bun 38 cr 1,53 BNP 690  Wbc 8.9 hgb 12.7 plt 111  TSH 0,296   Chest radiograph with cardiomegaly, with mild hilar vascular congestion, no infiltrates, pacemaker with one right atrial and ventricular lead.   CT chest and abdomen with no evidence of dissection or aneurysm, small pericardial effusion, small bilateral pleural effusions, no acute changes in the abdomen.  EKG 89 right axis deviation, normal qtc, atrial fibrillation rhythm, with no significant ST segment changes, negative T wave in V5 and V6.     Assessment and Plan: * Acute on chronic diastolic CHF (congestive heart failure) (HCC) Echocardiogram with LV EF preserved >75% with severe concentric LVH, moderate reduction in RV systolic function, moderate enlargement in RV cavity, severe bilateral atrial enlargement. Trivial pericardial effusion.   Acute on chronic core pulmonale, possible primary pulmonary hypertension.   Urine output documented is 200 cc. Systolic blood pressure 94 to 124 mmHg.   Plan to continue diuresis with furosemide   Continue blood pressure support with midodrine.  Holding metoprolol for now.    Chest pain Possible musculoskeletal Patient has distal interphalangeal deformities, possible RA.  Continue with acetaminophen for pain control and will add tramadol.  Out of bed to chair tid with meals.    Acute on chronic respiratory failure with hypoxia [facility]) Patient on chronic 02 supplementation at home. This am using 2 L/min per [place].   Congenital deafness Sing interpreter at the bedside.   Acute kidney injury superimposed on chronic kidney disease (HCC) CKD stage 3b. Hyponatremia.   Renal function with serum cr at 2,0 with K at 4,3 and serum bicarbonate at 27, Na is 131   T2DM (type 2 diabetes mellitus) (HCC) Continue insulin sliding scale for glucose cover and monitoring.   Anxiety Depression. Continue with escitalopram. Added as needed alprazolam.  Protein-calorie malnutrition, severe Continue with nutritional supplements.   Hypothyroidism Low TSH, clinically euthyroid Plan to continue with levothyroxine and check free T4 and free T3.         Subjective: Patient with improvement in chest pain and dyspnea, at home ambulates with no walker or caine.   Physical Exam: Vitals:   05/21/22 1217 05/21/22 1931 05/22/22 0042 05/22/22 0432  BP: (!) 122/59 (!) 105/57 94/83 124/62  Pulse: 75 69 75 87  Resp: (!) 27 13 14 19   Temp:  97.8 F (36.6 C)  98.7 F (37.1 C)  TempSrc:  Oral  Oral  SpO2: 97% 99% 96% 96%  Weight:    52.2 kg  Height:       Neurology awake and alert ENT with mild pallor  Cardiovascular with S1 and S2 present irregularly irregular with no gallops, rubs or murmurs Mild JVD Positive lower extremity edema + pitting Respiratory with rales bilaterally, no wheezing Abdomen not distended  Data Reviewed:    Family Communication: no family at the bedside   Disposition: Status is: Inpatient Remains inpatient appropriate because: heart failure   Planned Discharge  Destination: Home     Author: Tawni Millers, MD 05/22/2022 10:00 AM  For on call review [email or web address].

## 2022-05-22 NOTE — Hospital Course (Signed)
Veronica Young was admitted to the hospital with the working diagnosis of chest pain, complicated with congested heart failure.   88 yo female with congential deafness, CKD stage 3b, diastolic heart failure, atrial fibrillation, sick sinus syndrome sp PPM, coronary artery disease, hypertension, dyslipidemia and T2DM who presented with chest pain. Reported constant chest pain, upper back and upper chest, not exertion related and worse with vomiting. In the Ed her 02 saturation was 84% on 2 L/min per [place], her blood pressure 104/55, HR 80, RR 26, lungs with no wheezing, heart with S1 and S2 present with no gallops, abdomen not distended and positive lower extremity edema.   Na 135, K 3,9, Cl 93, bicarbonate 31, glucose 122, bun 38 cr 1,53 BNP 690  Wbc 8.9 hgb 12.7 plt 111  TSH 0,296   Chest radiograph with cardiomegaly, with mild hilar vascular congestion, no infiltrates, pacemaker with one right atrial and ventricular lead.   CT chest and abdomen with no evidence of dissection or aneurysm, small pericardial effusion, small bilateral pleural effusions, no acute changes in the abdomen.  EKG 89 right axis deviation, normal qtc, atrial fibrillation rhythm, with no significant ST segment changes, negative T wave in V5 and V6.   Patient was placed on furosemide for diuresis.  Worsening renal function and worsening oxygenation.  05/31 acute worsening in mental status, CT head with no signs of acute CVA.  06/01 follow up head CT negative for acute CVA. Neurology recommendations to continue statin and anticoagulation with apixaban.   06/02 continue to have high oxygen requirements, on 6 L per [facility] with 02 saturation 88%.

## 2022-05-22 NOTE — Progress Notes (Signed)
Pt daughter called [place] into the room and had indicated pt had a fall. On initial observation pt was on her right side. Noted redness on her right shoulder, no c/o pain noted on range of motion. Denies any pain on assessment when questioned using ASL. Dr. Cathlean Sauer updated via secure chat.

## 2022-05-22 NOTE — Assessment & Plan Note (Signed)
Low TSH, clinically euthyroid Plan to continue with levothyroxine and check free T4 and free T3.

## 2022-05-22 NOTE — Assessment & Plan Note (Signed)
Sing interpreter at the bedside.

## 2022-05-22 NOTE — Progress Notes (Signed)
Mobility Specialist Progress Note    05/22/22 1343  Mobility  Activity  (Cancelled; fatigue/lethargy from medicine)   Will f/u as able.  Hildred Alamin Mobility Specialist  Primary: 5N M.S. Phone: [phone] Secondary: 6N M.S. Phone: [phone]

## 2022-05-22 NOTE — Procedures (Shared)
Veronica Young is complete. Patient is able to resume normal eating and drinking. Patient has taken night medications.

## 2022-05-22 NOTE — Assessment & Plan Note (Addendum)
Depression. Continue with escitalopram. Patient not on alprazolam at home, she had oversedation yesterday from this medication. Will avoid further benzodiazepines.

## 2022-05-22 NOTE — Progress Notes (Signed)
   05/22/22 1902  What Happened  Was fall witnessed? No  Was patient injured? Unsure (redness on right shoulder)  Patient found on floor (by daughter)  Found by Staff-comment (found by daughter)  Stated prior activity other (comment) (assisted to the [facility])  Follow Up  MD notified Dr. Cathlean Sauer  Time MD notified [phone]  Family notified Yes - comment (Daughter found pt on floor)  Time family notified 1845  Additional tests No  Progress note created (see row info) Yes  Adult Fall Risk Assessment  Risk Factor Category (scoring not indicated) Fall has occurred during this admission (document High fall risk)  Age 88  Fall History: Fall within 6 months prior to admission 0  Elimination; Bowel and/or Urine Incontinence 0  Elimination; Bowel and/or Urine Urgency/Frequency 0  Medications: includes PCA/Opiates, Anti-convulsants, Anti-hypertensives, Diuretics, Hypnotics, Laxatives, Sedatives, and Psychotropics 3  Patient Care Equipment 1  Mobility-Assistance 2  Mobility-Gait 2  Mobility-Sensory Deficit 2  Altered awareness of immediate physical environment 1 (pt had xanax in AM)  Impulsiveness 0  Lack of understanding of one's physical/cognitive limitations 0  Total Score 14  Patient Fall Risk Level High fall risk  Adult Fall Risk Interventions  Required Bundle Interventions *See Row Information* High fall risk - low, moderate, and high requirements implemented  Additional Interventions Use of appropriate toileting equipment (bedpan, BSC, etc.)  Screening for Fall Injury Risk (To be completed on HIGH fall risk patients) - Assessing Need for Floor Mats  Risk For Fall Injury- Criteria for Floor Mats Previous fall this admission  Will Implement Floor Mats Yes  Pain Assessment  Pain Scale 0-10  Pain Score 0  Faces Pain Scale 0  Neurological  Neuro (WDL) X  Level of Consciousness Alert  Orientation Level Oriented X4  Cognition Appropriate attention/concentration  Speech [place] (Uses ASL)   R Pupil Size (mm) 3  R Pupil Shape Round  R Pupil Reaction Brisk  L Pupil Size (mm) 3  L Pupil Shape Round  L Pupil Reaction Brisk  Motor Function/Sensation Assessment Motor response  RUE Motor Response Purposeful movement  LUE Motor Response Purposeful movement  RLE Motor Response Purposeful movement  LLE Motor Response Purposeful movement  Neuro Symptoms None  Glasgow Coma Scale  Eye Opening 4  Best Verbal Response (NON-intubated) 5  Best Motor Response 6  Musculoskeletal  Musculoskeletal (WDL) X  Assistive Device Front wheel walker  Generalized Weakness Yes  Weight Bearing Restrictions No  Integumentary  Integumentary (WDL) X  Skin Color Appropriate for ethnicity  Skin Condition Dry  Skin Integrity Intact  Erythema/Redness Location Shoulder  Erythema/Redness Location Orientation Right  Skin Turgor Non-tenting

## 2022-05-23 ENCOUNTER — Inpatient Hospital Stay (HOSPITAL_COMMUNITY): Payer: Medicare HMO

## 2022-05-23 DIAGNOSIS — G9341 Metabolic encephalopathy: Secondary | ICD-10-CM

## 2022-05-23 DIAGNOSIS — N189 Chronic kidney disease, unspecified: Secondary | ICD-10-CM

## 2022-05-23 DIAGNOSIS — I5033 Acute on chronic diastolic (congestive) heart failure: Secondary | ICD-10-CM | POA: Diagnosis not present

## 2022-05-23 DIAGNOSIS — N179 Acute kidney failure, unspecified: Secondary | ICD-10-CM | POA: Diagnosis not present

## 2022-05-23 DIAGNOSIS — F419 Anxiety disorder, unspecified: Secondary | ICD-10-CM | POA: Diagnosis not present

## 2022-05-23 DIAGNOSIS — H905 Unspecified sensorineural hearing loss: Secondary | ICD-10-CM | POA: Diagnosis not present

## 2022-05-23 DIAGNOSIS — R0989 Other specified symptoms and signs involving the circulatory and respiratory systems: Secondary | ICD-10-CM

## 2022-05-23 LAB — LIPID PANEL
Cholesterol: 182 mg/dL (ref 0–200)
HDL: 29 mg/dL — ABNORMAL LOW (ref >40)
LDL Cholesterol: 136 mg/dL — ABNORMAL HIGH (ref 0–99)
Total CHOL/HDL Ratio: 6.3 RATIO
Triglycerides: 85 mg/dL (ref <150)
VLDL: 17 mg/dL (ref 0–40)

## 2022-05-23 LAB — BASIC METABOLIC PANEL
Anion gap: 8 (ref 5–15)
BUN: 53 mg/dL — ABNORMAL HIGH (ref 8–23)
CO2: 28 mmol/L (ref 22–32)
Calcium: 8.6 mg/dL — ABNORMAL LOW (ref 8.9–10.3)
Chloride: 96 mmol/L — ABNORMAL LOW (ref 98–111)
Creatinine, Ser: 1.98 mg/dL — ABNORMAL HIGH (ref 0.44–1.00)
GFR, Estimated: 24 mL/min — ABNORMAL LOW (ref >60)
Glucose, Bld: 106 mg/dL — ABNORMAL HIGH (ref 70–99)
Potassium: 3.9 mmol/L (ref 3.5–5.1)
Sodium: 132 mmol/L — ABNORMAL LOW (ref 135–145)

## 2022-05-23 LAB — GLUCOSE, CAPILLARY
Glucose-Capillary: 87 mg/dL (ref 70–99)
Glucose-Capillary: 127 mg/dL — ABNORMAL HIGH (ref 70–99)
Glucose-Capillary: 156 mg/dL — ABNORMAL HIGH (ref 70–99)
Glucose-Capillary: 135 mg/dL — ABNORMAL HIGH (ref 70–99)

## 2022-05-23 LAB — MAGNESIUM: Magnesium: 2.4 mg/dL (ref 1.7–2.4)

## 2022-05-23 LAB — T4, FREE: Free T4: 1.19 ng/dL — ABNORMAL HIGH (ref 0.61–1.12)

## 2022-05-23 MED ORDER — ROSUVASTATIN CALCIUM 5 MG PO TABS
10.0000 mg | ORAL_TABLET | Freq: Every day | ORAL | Status: DC
  Administered 2022-05-23 – 2022-05-24 (×2): 10 mg via ORAL
  Filled 2022-05-23 (×5): qty 2

## 2022-05-23 MED ORDER — NETARSUDIL DIMESYLATE 0.02 % OP SOLN: 1.0000 [drp] | Freq: Every day | OPHTHALMIC | Status: DC

## 2022-05-23 MED ORDER — LATANOPROST 0.005 % OP SOLN
1.0000 [drp] | Freq: Two times a day (BID) | OPHTHALMIC | Status: DC
Start: 2022-05-23 — End: 2022-05-27
  Administered 2022-05-23 – 2022-05-27 (×8): 1 [drp] via OPHTHALMIC
  Filled 2022-05-23 (×2): qty 2.5

## 2022-05-23 MED ORDER — FLUOROMETHOLONE 0.1 % OP SUSP
1.0000 [drp] | Freq: Four times a day (QID) | OPHTHALMIC | Status: DC
  Administered 2022-05-23 (×3): 1 [drp] via OPHTHALMIC

## 2022-05-23 MED ORDER — ATROPINE SULFATE 1 % OP SOLN
1.0000 [drp] | Freq: Every day | OPHTHALMIC | Status: DC
Start: 2022-05-23 — End: 2022-05-27
  Administered 2022-05-24 – 2022-05-25 (×2): 1 [drp] via OPHTHALMIC
  Filled 2022-05-23 (×2): qty 2

## 2022-05-23 NOTE — Assessment & Plan Note (Addendum)
Multifactorial encephalopathy.   Patient fell yesterday. No head trauma.   This am she is non focal and has preserved strength upper and lower extremities.  Neurology was consulted last night.  Continue to follow on recommendations.  Fall precautions and neuro checks per unit protocol.

## 2022-05-23 NOTE — Progress Notes (Signed)
When assisting patient to the bedside commode, patient needed contact guard assistance to transfer from the bed to [facility]. Patient was aware of her right side but had difficulty using right leg. Patient displayed difficulty holding her self up using right hand. Patient denies pain in BUE, BLE and back. At baseline patient is a standby assist with cues.

## 2022-05-23 NOTE — Progress Notes (Signed)
VASCULAR LAB    Carotid duplex has been performed.  See CV proc for preliminary results.   Hansel Devan, RVT 05/23/2022, 2:22 PM

## 2022-05-23 NOTE — TOC Progression Note (Addendum)
Transition of Care [facility]) - Progression Note    Patient Details  Name: Veronica Young MRN: [id number] Date of Birth: August 13, 1933  Transition of Care [facility]) CM/SW Contact  Veronica Young, Veronica Cornfield, RN Phone Number: 05/23/2022, 2:38 PM  Clinical Narrative:  Interpreting Services called and appointment arranged at 1420. Case Manager met with Veronica Young Interpreter and daughters were at the bedside. Case Manager offered choice to the patient and she asked if Case Manager could call daughter Veronica Young for choice. Patient could not remember the agency name. Case Manager called Veronica Young and the patient has used [facility] in the past and wants to use them again. Referral submitted to [facility] and start of care to begin within 24-48 hours post transition home. Daughter Veronica Young states she will provide transportation home once stable. Unit can call [phone] option 2 for the interpreting services for discharge planning needs. Case Manager will continue to follow for additional transition of care needs.    1614 05-23-22 [facility] asked to add [facility] RN- secure chat sent to provider to add [facility] RN for cardiopulmonary assessment at home.   Expected Discharge Plan: [place] Barriers to Discharge: No Barriers Identified  Expected Discharge Plan and Services Expected Discharge Plan: [place] In-house Referral: NA Discharge Planning Services: CM Consult Post Acute Care Choice: [place] arrangements for the past 2 months: Single Family Home                   DME Agency: NA       HH Arranged: PT, OT HH Agency: [facility] Date [place] Agency Contacted: 05/23/22 Time [place]: [id number] Representative spoke with at [place]: Veronica Young  Readmission Risk Interventions     View : No data to display.

## 2022-05-23 NOTE — Evaluation (Signed)
Occupational Therapy Evaluation Patient Details Name: Veronica Young MRN: [id number] DOB: [birth year] Today's Date: 05/23/2022   History of Present Illness 88 y.o. female admitted 5/29 with chest pain and increased oxygen requirement with CHF exacerbation. 5/31 Fall after sedating medication with possible rt weakness and head CT (-). 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  PTA she lives with her daughter, who is also deaf.  Interpreter used.  Patient stating she used a RW at home, but interpreter stated this information was different than what the patient reported to PT.  Patient also states she cared for own ADL, and participated in IADL, but daughter was primary caregiver.  Currently she is needing up to [place] for basic mobility, and Min Guard for lower body ADL from a sit/stand level.  OT will follow in the acute setting, with ? [place] OT        Recommendations for follow up therapy are one component of a multi-disciplinary discharge planning process, led by the attending physician.  Recommendations may be updated based on patient status, additional functional criteria and insurance authorization.   Follow Up Recommendations  Other (comment) (OT can defer to [facility] PT if they believe she needs HH OT)    Assistance Recommended at Discharge Intermittent Supervision/Assistance  Patient can return home with the following A little help with bathing/dressing/bathroom;Assist for transportation    Functional Status Assessment  Patient has had a recent decline in their functional status and demonstrates the ability to make significant improvements in function in a reasonable and predictable amount of time.  Equipment Recommendations  None recommended by OT    Recommendations for Other Services       Precautions / Restrictions Precautions Precautions:  Fall Restrictions Weight Bearing Restrictions: No      Mobility Bed Mobility Overal bed mobility: Needs Assistance Bed Mobility: Supine to Sit     Supine to sit: Min assist          Transfers     Transfers: Sit to/from Stand, Bed to chair/wheelchair/BSC Sit to Stand: Min assist                  Balance Overall balance assessment: Needs assistance Sitting-balance support: Feet supported, No upper extremity supported Sitting balance-Leahy Scale: Fair     Standing balance support: Reliant on assistive device for balance Standing balance-Leahy Scale: Poor                             ADL either performed or assessed with clinical judgement   ADL       Grooming: Wash/dry hands;Min guard;Standing           Upper Body Dressing : Set up;Sitting   Lower Body Dressing: Minimal assistance;Sit to/from stand   Toilet Transfer: Minimal assistance;Regular Toilet;Rolling walker (2 wheels) Toilet Transfer Details (indicate cue type and reason): patient hitting objects in room with RW - tending to drift to the Left with RW           General ADL Comments: unsteady     Vision Baseline Vision/History: 1 Wears glasses Vision Assessment?: No apparent visual deficits     Perception Perception Perception: Not tested   Praxis Praxis Praxis: Not tested    Pertinent Vitals/Pain Pain Assessment Pain Assessment: No/denies pain  Hand Dominance Right   Extremity/Trunk Assessment Upper Extremity Assessment Upper Extremity Assessment: Overall WFL for tasks assessed   Lower Extremity Assessment Lower Extremity Assessment: Defer to PT evaluation RLE Deficits / Details: right knee flexion contracture grossly 20 degree ROM which pt reports is baseline post TKA   Cervical / Trunk Assessment Cervical / Trunk Assessment: Normal   Communication Communication Communication: Interpreter utilized;Deaf   Cognition Arousal/Alertness: Awake/alert    Overall Cognitive Status: Within Functional Limits for tasks assessed                                 General Comments: mild impulsivity     General Comments       Exercises     Shoulder Instructions      Home Living Family/patient expects to be discharged to:: Private residence Living Arrangements: Children Available Help at Discharge: Family;Available 24 hours/day Type of Home: House Home Access: Stairs to enter [employer] of Steps: 1   Home Layout: One level     Bathroom Shower/Tub: Teacher, early years/pre: Standard     Home Equipment: None   Additional Comments: daughter lives with her      Prior Functioning/Environment Prior Level of Function : Needs assist       Physical Assist : ADLs (physical)     Mobility Comments: Patient  states she has a RW for at home use ADLs Comments: assist for homemaking and driving, bathes and dresses on her own.  Cares for her own meds.        OT Problem List: Decreased activity tolerance;Impaired balance (sitting and/or standing);Decreased safety awareness      OT Treatment/Interventions: Self-care/ADL training;Therapeutic activities;Patient/family education;Balance training    OT Goals(Current goals can be found in the care plan section) Acute Rehab OT Goals Patient Stated Goal: Return home OT Goal Formulation: With patient Time For Goal Achievement: 06/10/2022 Potential to Achieve Goals: Good ADL Goals Pt Will Perform Grooming: with modified independence;standing Pt Will Perform Upper Body Dressing: with modified independence;sitting;standing Pt Will Perform Lower Body Dressing: with modified independence;sit to/from stand Pt Will Transfer to Toilet: with modified independence;ambulating;regular height toilet  OT Frequency: Min 2X/week    Co-evaluation              AM-PAC OT "6 Clicks" Daily Activity     Outcome Measure Help from another person eating meals?: None Help  from another person taking care of personal grooming?: A Little Help from another person toileting, which includes using toliet, bedpan, or urinal?: A Little Help from another person bathing (including washing, rinsing, drying)?: A Little Help from another person to put on and taking off regular upper body clothing?: A Little Help from another person to put on and taking off regular lower body clothing?: A Little 6 Click Score: 19   End of Session Equipment Utilized During Treatment: Gait belt;Rolling walker (2 wheels);Oxygen Nurse Communication: Mobility status  Activity Tolerance: Patient tolerated treatment well Patient left: in chair;with call bell/phone within reach;with family/visitor present  OT Visit Diagnosis: Unsteadiness on feet (R26.81)                Time: 1308-6578 OT Time Calculation (min): 21 min Charges:  OT General Charges $OT Visit: 1 Visit OT Evaluation $OT Eval Moderate Complexity: 1 Mod  05/23/2022  RP, OTR/L  Acute Rehabilitation Services  Office:  [phone]   Metta Clines 05/23/2022, 11:38 AM

## 2022-05-23 NOTE — Progress Notes (Signed)
STROKE TEAM PROGRESS NOTE   SUBJECTIVE (INTERVAL HISTORY) Her daughter is at the bedside.  Overall her condition is gradually improving. Pt has hard of hearing, and daughter is using sign language to communicate with her. She is not in distress, seems comfortably lying in bed, mildly lethargic but follows all simple commands. No focal deficit except right facial droop but that is chronic per daughter. Not able to do MRI due to pacer not compatible with MRI. Will repeat CT in am.    OBJECTIVE Temp:  [97.5 F (36.4 C)-98 F (36.7 C)] 97.5 F (36.4 C) (06/01 1134) Pulse Rate:  [63-78] 63 (06/01 1134) Cardiac Rhythm: Atrial fibrillation (06/01 0900) Resp:  [19-30] 26 (06/01 1134) BP: (109-133)/(61-75) 116/61 (06/01 1134) SpO2:  [92 %-95 %] 95 % (06/01 1134) Weight:  [53 kg] 53 kg (06/01 0402)  Recent Labs  Lab 05/22/22 1225 05/22/22 1643 05/22/22 2055 05/23/22 0738 05/23/22 1132  GLUCAP 147* 157* 109* 87 127*   Recent Labs  Lab 05/20/22 1742 05/21/22 0647 05/22/22 0249 05/23/22 0204  NA 135 135 131* 132*  K 3.9 3.8 4.3 3.9  CL 93* 95* 97* 96*  CO2 31 31 27 28   GLUCOSE 122* 110* 152* 106*  BUN 38* 40* 53* 53*  CREATININE 1.53* 1.76* 2.01* 1.98*  CALCIUM 9.0 9.0 8.7* 8.6*  MG 2.1  --   --  2.4   Recent Labs  Lab 05/20/22 1742 05/21/22 0647 05/22/22 0249  AST 43* 38 32  ALT 23 22 21   ALKPHOS 163* 149* 142*  BILITOT 2.0* 1.9* 1.4*  PROT 7.4 6.7 6.3*  ALBUMIN 3.6 3.1* 2.9*   Recent Labs  Lab 05/20/22 1742 05/21/22 0647 05/22/22 0249  WBC 8.9 9.1 10.1  NEUTROABS 6.9  --   --   HGB 12.7 12.1 11.1*  HCT 39.5 37.2 34.7*  MCV 88.4 87.3 88.3  PLT 111* 102* 115*   No results for input(s): CKTOTAL, CKMB, CKMBINDEX, TROPONINI in the last 168 hours. No results for input(s): LABPROT, INR in the last 72 hours. No results for input(s): COLORURINE, LABSPEC, [place], GLUCOSEU, HGBUR, BILIRUBINUR, KETONESUR, PROTEINUR, UROBILINOGEN, NITRITE, LEUKOCYTESUR in the last 72  hours.  Invalid input(s): APPERANCEUR     Component Value Date/Time   CHOL 182 05/23/2022 0204   TRIG 85 05/23/2022 0204   HDL 29 (L) 05/23/2022 0204   CHOLHDL 6.3 05/23/2022 0204   VLDL 17 05/23/2022 0204   LDLCALC 136 (H) 05/23/2022 0204   Lab Results  Component Value Date   HGBA1C 6.1 (H) 05/21/2022   No results found for: LABOPIA, COCAINSCRNUR, LABBENZ, AMPHETMU, THCU, LABBARB  No results for input(s): ETH in the last 168 hours.  I have personally reviewed the radiological images below and agree with the radiology interpretations.  CT HEAD WO CONTRAST (5MM)  Result Date: 05/22/2022 CLINICAL DATA:  Initial evaluation for neuro deficit, stroke suspected. EXAM: CT HEAD WITHOUT CONTRAST TECHNIQUE: Contiguous axial images were obtained from the base of the skull through the vertex without intravenous contrast. RADIATION DOSE REDUCTION: This exam was performed according to the departmental dose-optimization program which includes automated exposure control, adjustment of the mA and/or kV according to patient size and/or use of iterative reconstruction technique. COMPARISON:  Prior CT from 09/19/2013. FINDINGS: Brain: Generalized age-related cerebral atrophy with mild chronic small vessel ischemic disease. No acute intracranial hemorrhage. No acute large vessel territory infarct. No mass lesion, midline shift or mass effect. No hydrocephalus or extra-axial fluid collection. Vascular: No hyperdense vessel. Calcified atherosclerosis  present about the skull base. Skull: No acute scalp soft tissue abnormality. Small nodular calcification at the right parieto-occipital scalp noted, nonspecific, but likely benign. Calvarium intact. Sinuses/Orbits: Globes and orbital soft tissues demonstrate no acute finding. Paranasal sinuses mastoid air cells are largely clear. Other: 10. IMPRESSION: 1. No acute intracranial abnormality. No hemorrhage or large vessel territory infarct by CT. 2. Generalized age-related  cerebral atrophy with mild chronic small vessel ischemic disease. Electronically Signed   By: Jeannine Boga M.D.   On: 05/22/2022 22:22   DG CHEST PORT 1 VIEW  Result Date: 05/23/2022 CLINICAL DATA:  Acute and chronic respiratory failure with hypoxia. EXAM: PORTABLE CHEST 1 VIEW COMPARISON:  05/20/2022. FINDINGS: 4:26 a.m. 05/23/2022. Left chest pacing system and wire insertions are unaltered. The heart moderately enlarged with persistent mild perihilar vascular congestion and lower zonal interstitial edema with small but interval increased pleural effusions. There are stable hazy opacities in the lower lung fields consistent with ground-glass edema, pneumonitis or combination. There is increased band consolidation in the right lower lung field today which could be atelectasis or a small pneumonia. The mid and upper lungs clear with COPD change. In all other respects no further changes. Stable aortic tortuosity with ectasia. IMPRESSION: 1. Increased opacity in the right lower lung field consistent with atelectasis or focal pneumonia. 2. Basilar interstitial edema and small interval increased pleural effusions with hazy overlying opacities consistent with edema or pneumonia. 3. Cardiomegaly. Electronically Signed   By: Telford Nab M.D.   On: 05/23/2022 06:28   DG Chest Portable 1 View  Result Date: 05/20/2022 CLINICAL DATA:  Pleuritic sharp pain to back. EXAM: PORTABLE CHEST 1 VIEW pleuritic sharp stabbing pain to back. COMPARISON:  AP chest 01/13/2022 and 11/13/2020; CT chest 11/14/2020 FINDINGS: Left chest wall cardiac pacer is seen with leads overlying right atrium and right ventricle. Cardiac silhouette is again mildly to moderately enlarged. Mediastinal contours are within normal limits. There is unchanged mild bilateral interstitial thickening, greatest within the lung bases. Cystic lucencies are again seen within the upper lungs suggesting cystic emphysematous change. No definite pleural  effusion. No pneumothorax. Mild levocurvature of the thoracic spine with mild multilevel degenerative disc changes. IMPRESSION: No significant change from 01/13/2022. Cardiomegaly with mild interstitial pulmonary edema. Electronically Signed   By: Yvonne Kendall M.D.   On: 05/20/2022 18:15   ECHOCARDIOGRAM COMPLETE  Result Date: 05/21/2022    ECHOCARDIOGRAM REPORT   Patient Name:   Veronica Young Date of Exam: 05/21/2022 Medical Rec #:  [id number]      Height:       65.0 in Accession #:    [id number]     Weight:       113.8 lb Date of Birth:  1933-02-22     BSA:          1.557 m Patient Age:    6 years       BP:           115/64 mmHg Patient Gender: F              HR:           72 bpm. Exam Location:  Inpatient Procedure: 2D Echo, Cardiac Doppler and Color Doppler Indications:    CHF  History:        Patient has prior history of Echocardiogram examinations, most                 recent 01/14/2022. CHF, Pacemaker; Risk Factors:Diabetes and  Dyslipidemia.  Sonographer:    Luisa Hart RDCS Referring Phys: [id number] Shela Leff  Sonographer Comments: 07/26/2014 pacemaker IMPRESSIONS  1. Compared to echo from Jan 2023, no significant change in LVEF and RVEF.  2. Left ventricular ejection fraction, by estimation, is >75%. The left ventricle has hyperdynamic function. The left ventricle has no regional wall motion abnormalities. There is severe concentric left ventricular hypertrophy. Left ventricular diastolic parameters are indeterminate.  3. Right ventricular systolic function is moderately reduced. The right ventricular size is moderately enlarged.  4. Left atrial size was severely dilated.  5. Right atrial size was severely dilated.  6. The mitral valve is normal in structure. Mild mitral valve regurgitation.  7. The aortic valve is tricuspid. Aortic valve regurgitation is not visualized. Aortic valve sclerosis is present, with no evidence of aortic valve stenosis.  8. The inferior vena cava is  dilated in size with <50% respiratory variability, suggesting right atrial pressure of 15 mmHg. FINDINGS  Left Ventricle: Left ventricular ejection fraction, by estimation, is >75%. The left ventricle has hyperdynamic function. The left ventricle has no regional wall motion abnormalities. The left ventricular internal cavity size was small. There is severe concentric left ventricular hypertrophy. Left ventricular diastolic parameters are indeterminate. Right Ventricle: The right ventricular size is moderately enlarged. Right vetricular wall thickness was not assessed. Right ventricular systolic function is moderately reduced. Left Atrium: Left atrial size was severely dilated. Right Atrium: Right atrial size was severely dilated. Pericardium: Trivial pericardial effusion is present. Mitral Valve: The mitral valve is normal in structure. Mild mitral valve regurgitation. Tricuspid Valve: The tricuspid valve is normal in structure. Tricuspid valve regurgitation is mild. Aortic Valve: The aortic valve is tricuspid. Aortic valve regurgitation is not visualized. Aortic valve sclerosis is present, with no evidence of aortic valve stenosis. Aortic valve mean gradient measures 2.3 mmHg. Aortic valve peak gradient measures 4.4  mmHg. Aortic valve area, by VTI measures 3.46 cm. Pulmonic Valve: The pulmonic valve was normal in structure. Pulmonic valve regurgitation is not visualized. Aorta: The aortic root and ascending aorta are structurally normal, with no evidence of dilitation. Venous: The inferior vena cava is dilated in size with less than 50% respiratory variability, suggesting right atrial pressure of 15 mmHg. IAS/Shunts: No atrial level shunt detected by color flow Doppler. Additional Comments: A device lead is visualized.  LEFT VENTRICLE PLAX 2D LVIDd:         3.20 cm     Diastology LVIDs:         1.80 cm     LV e' medial:    5.56 cm/s LV PW:         1.50 cm     LV E/e' medial:  17.2 LV IVS:        1.80 cm     LV e'  lateral:   7.37 cm/s LVOT diam:     2.30 cm     LV E/e' lateral: 13.0 LV SV:         65 LV SV Index:   42 LVOT Area:     4.15 cm  LV Volumes (MOD) LV vol d, MOD A2C: 23.7 ml LV vol d, MOD A4C: 24.9 ml LV vol s, MOD A2C: 11.9 ml LV vol s, MOD A4C: 10.1 ml LV SV MOD A2C:     11.8 ml LV SV MOD A4C:     24.9 ml LV SV MOD BP:      13.6 ml RIGHT VENTRICLE RV Basal diam:  3.50 cm RV Mid diam:    1.80 cm RV S prime:     7.74 cm/s TAPSE (M-mode): 1.3 cm LEFT ATRIUM             Index        RIGHT ATRIUM           Index LA Vol (A2C):   40.5 ml 26.02 ml/m  RA Area:     22.20 cm LA Vol (A4C):   66.2 ml 42.53 ml/m  RA Volume:   67.20 ml  43.17 ml/m LA Biplane Vol: 55.2 ml 35.46 ml/m  AORTIC VALVE                    PULMONIC VALVE AV Area (Vmax):    3.34 cm     PV Vmax:       0.74 m/s AV Area (Vmean):   3.24 cm     PV Vmean:      51.575 cm/s AV Area (VTI):     3.46 cm     PV VTI:        0.134 m AV Vmax:           104.67 cm/s  PV Peak grad:  2.2 mmHg AV Vmean:          71.233 cm/s  PV Mean grad:  1.0 mmHg AV VTI:            0.187 m AV Peak Grad:      4.4 mmHg AV Mean Grad:      2.3 mmHg LVOT Vmax:         84.20 cm/s LVOT Vmean:        55.500 cm/s LVOT VTI:          0.156 m LVOT/AV VTI ratio: 0.83  AORTA Ao Root diam: 2.90 cm Ao Asc diam:  3.50 cm MV E velocity: 95.67 cm/s  TRICUSPID VALVE                            TR Peak grad:   68.9 mmHg                            TR Vmax:        415.00 cm/s                             SHUNTS                            Systemic VTI:  0.16 m                            Systemic Diam: 2.30 cm Dorris Carnes MD Electronically signed by Dorris Carnes MD Signature Date/Time: 05/21/2022/10:53:17 AM    Final    CT Angio Chest/Abd/Pel for Dissection W and/or Wo Contrast  Result Date: 05/20/2022 CLINICAL DATA:  Chest and back pain. EXAM: CT ANGIOGRAPHY CHEST, ABDOMEN AND PELVIS TECHNIQUE: Non-contrast CT of the chest was initially obtained. Multidetector CT imaging through the chest, abdomen and  pelvis was performed using the standard protocol during bolus administration of intravenous contrast. Multiplanar reconstructed images and MIPs were obtained and reviewed to evaluate the vascular anatomy. RADIATION DOSE REDUCTION: This exam was performed according to the departmental dose-optimization program which includes  automated exposure control, adjustment of the mA and/or kV according to patient size and/or use of iterative reconstruction technique. CONTRAST:  40mL OMNIPAQUE IOHEXOL 350 MG/ML SOLN COMPARISON:  CT chest 11/14/2020 FINDINGS: CTA CHEST FINDINGS Cardiovascular: Heart is moderately enlarged, unchanged. There is a new small pericardial effusion. There is adequate opacification of the thoracic aorta. There is no evidence for aortic dissection or aneurysm. There is calcified atherosclerotic disease throughout the aorta. Left-sided pacemaker is present with leads in the right atrium and right ventricle. Mediastinum/Nodes: No enlarged mediastinal, hilar, or axillary lymph nodes. Thyroid gland, trachea, and esophagus demonstrate no significant findings. Lungs/Pleura: There are trace bilateral pleural effusions scratch at trace right and small left pleural effusions are present. There is a small amount of atelectasis in the bilateral lower lobes, right middle lobe and lingula. There is a calcified granuloma in the right lung apex, unchanged. Pneumothorax. Musculoskeletal: No acute fracture. Review of the MIP images confirms the above findings. CTA ABDOMEN AND PELVIS FINDINGS VASCULAR Aorta: Normal caliber aorta without aneurysm, dissection, vasculitis or significant stenosis. There are atherosclerotic calcifications of the aorta. Celiac: Patent without evidence of aneurysm, dissection, vasculitis or significant stenosis. SMA: Patent without evidence of aneurysm, dissection, vasculitis or significant stenosis. Renals: There is moderate stenosis at the origin of the right renal artery. Left renal artery is  within normal limits. IMA: Patent without evidence of aneurysm, dissection, vasculitis or significant stenosis. Inflow: Patent without evidence of aneurysm, dissection, vasculitis or significant stenosis. Veins: No obvious venous abnormality within the limitations of this arterial phase study. Review of the MIP images confirms the above findings. NON-VASCULAR Hepatobiliary: No focal liver abnormality is seen. Status post cholecystectomy. No biliary dilatation. Pancreas: Unremarkable. No pancreatic ductal dilatation or surrounding inflammatory changes. Spleen: Normal in size without focal abnormality. Adrenals/Urinary Tract: Renal cortical hypodensities are seen in both kidneys which are too small to characterize, likely cysts. There is no hydronephrosis or perinephric fluid. The adrenal glands and bladder are within normal limits. Stomach/Bowel: Stomach is within normal limits. No evidence of bowel wall thickening, distention, or inflammatory changes. Appendix is not seen. There is sigmoid colon diverticulosis without evidence for acute diverticulitis. Lymphatic: No enlarged lymph nodes are seen. Reproductive: Uterus and bilateral adnexa are unremarkable. Other: No abdominal wall hernia or abnormality. Small amount of free fluid in the pelvis. Musculoskeletal: There is scoliosis and degenerative change throughout the lumbar spine. Review of the MIP images confirms the above findings. IMPRESSION: 1. No evidence for aortic dissection or aneurysm. 2. Moderate cardiomegaly with small pericardial effusion. 3. Small bilateral pleural effusions. 4. Small amount of free fluid in the pelvis of uncertain etiology. 5. Sigmoid colon diverticulosis. 6.  Aortic Atherosclerosis (ICD10-I70.0). Electronically Signed   By: Ronney Asters M.D.   On: 05/20/2022 19:37     PHYSICAL EXAM  Temp:  [97.5 F (36.4 C)-98 F (36.7 C)] 97.5 F (36.4 C) (06/01 1134) Pulse Rate:  [63-78] 63 (06/01 1134) Resp:  [19-30] 26 (06/01 1134) BP:  (109-133)/(61-75) 116/61 (06/01 1134) SpO2:  [92 %-95 %] 95 % (06/01 1134) Weight:  [53 kg] 53 kg (06/01 0402)  General - Well nourished, well developed, in no apparent distress, lethargic.  Ophthalmologic - fundi not visualized due to noncooperation.  Cardiovascular - Regular rhythm and rate.  Neuro - awake, alert, eyes open, mildly lethargic (daughter said she just woke from sleep). Heard of hearing, able to speak a few words with dysarthric voice, but otherwise communicating with sign language with daughter. orientated to  age, place, time and people. Following all simple commands. Left eye mild ptosis with corneal clouding and blind, right eye no gaze palsy, tracking bilaterally, visual field full. Chronic right facial droop. Tongue midline. Bilateral UEs 4/5, no drift. Bilaterally LEs 3/5, no drift. Sensation symmetrical bilaterally, b/l FTN intact, gait not tested.    ASSESSMENT/PLAN Ms. Veronica Young is a 88 y.o. female with history of A-fib on Eliquis 2.5 mg twice daily, CAD, hypertension, hyperlipidemia, diabetes, left eye blind, CHF, pacemaker, AKI on CKD admitted for chest pain with CHF exacerbation and respiratory distress.  Patient found to have somnolence with a fall and questionable right-sided weakness and aphasia.  Neurology consulted for evaluation.  Likely encephalopathy from CHF exacerbation, stroke less likely CT no acute abnormality MRI not able to perform given incompatible pacemaker Would not recommend CTA head and neck given AKI on CKD Intracranial imaging not needed at this time given not changing management CT repeat in a.m. Carotid Doppler unremarkable 2D Echo  > 75% LDL 136 HgbA1c 6.1 Eliquis for VTE prophylaxis Eliquis (apixaban) daily prior to admission, now on Eliquis 2.5 mg twice daily. Patient counseled to be compliant with her antithrombotic medications Ongoing aggressive stroke risk factor management Therapy recommendations: Home health  PT Disposition: Pending  CHF exacerbation PAF Treated with IV Lasix Cardiology on board On Eliquis 2.5 PTA Continue Eliquis  Diabetes HgbA1c 6.1 goal < 7.0 Controlled CBG monitoring SSI DM education and close PCP follow up  Hypertension Stable on the low end Long term BP goal normotensive  Hyperlipidemia Home meds: None LDL 136, goal < 70 Now on Crestor 10 Continue statin at discharge  Other Stroke Risk Factors Advanced age Coronary artery disease  Other Active Problems Pacemaker in place Left eye blind AKI on CKD 3B, creatinine 1.53-2.01-1.98  Hospital day # 3    Rosalin Hawking, MD PhD Stroke Neurology 05/23/2022 12:02 PM    To contact Stroke Continuity provider, please refer to [email or web address] After hours, contact General Neurology

## 2022-05-23 NOTE — Evaluation (Signed)
Physical Therapy Evaluation Patient Details Name: Veronica Young MRN: [id number] DOB: Aug 24, 1933 Today's Date: 05/23/2022  History of Present Illness  88 y.o. female admitted 5/29 with chest pain and increased oxygen requirement with CHF exacerbation. 5/31 Fall after sedating medication with possible rt weakness and head CT (-). PMhx: deafness (ASL), AFib, CAD, HTN, HLD, T2DM, CHF, CAD with PPM, lumbago, AKI on CKD, glaucoma (blind in the left eye  Clinical Impression  Pt very pleasant with daughter present initially and interpreter Cecille Rubin K present throughout. Pt with right knee flexion contracture with difficulty performing all transfers with noted posterior bias with sitting and standing balance requiring physical assist. Will attempt RW next session to determine if AD provides sufficient balance assist. Pt very pleasant, wants to return home and does not generally walk long distance. Pt with decreased ROM, function, balance and gait who will benefit from acute therapy to maximize mobility, safety and function.   On on 7L at rest and 8L with gait to maintain 90-92%      Recommendations for follow up therapy are one component of a multi-disciplinary discharge planning process, led by the attending physician.  Recommendations may be updated based on patient status, additional functional criteria and insurance authorization.  Follow Up Recommendations Home health PT    Assistance Recommended at Discharge Frequent or constant Supervision/Assistance  Patient can return home with the following  A little help with walking and/or transfers;A little help with bathing/dressing/bathroom;Assistance with cooking/housework;Assist for transportation;Direct supervision/assist for medications management;Help with stairs or ramp for entrance    Equipment Recommendations Rolling walker (2 wheels)  Recommendations for Other Services       Functional Status Assessment Patient has had a recent decline in their  functional status and demonstrates the ability to make significant improvements in function in a reasonable and predictable amount of time.     Precautions / Restrictions Precautions Precautions: Fall      Mobility  Bed Mobility Overal bed mobility: Needs Assistance Bed Mobility: Supine to Sit, Sit to Supine     Supine to sit: Mod assist Sit to supine: Min guard   General bed mobility comments: mod assist to transition from supine to sit, guarding to return to bed with bed flat and increased time    Transfers Overall transfer level: Needs assistance   Transfers: Sit to/from Stand Sit to Stand: Min assist           General transfer comment: min assist for balance due to posterior bias    Ambulation/Gait Ambulation/Gait assistance: Min assist Gait Distance (Feet): 80 Feet Assistive device: None Gait Pattern/deviations: Step-through pattern, Decreased stride length   Gait velocity interpretation: 1.31 - 2.62 ft/sec, indicative of limited community ambulator   General Gait Details: pt with min assist for balance as pt with posterior bias and veering to right with gait with 2 LOB requiring assist to recover. will plan to attempt RW next session  Stairs            Wheelchair Mobility    Modified Rankin (Stroke Patients Only)       Balance Overall balance assessment: Needs assistance Sitting-balance support: Feet supported, No upper extremity supported Sitting balance-Leahy Scale: Poor Sitting balance - Comments: pt with progressive posterior lean in sitting and unable to correct without physical assist Postural control: Posterior lean Standing balance support: No upper extremity supported Standing balance-Leahy Scale: Poor Standing balance comment: min physical assist due to posterior lean  Pertinent Vitals/Pain Pain Assessment Pain Assessment: No/denies pain    Home Living Family/patient expects to be  discharged to:: Private residence Living Arrangements: Children Available Help at Discharge: Family;Available 24 hours/day Type of Home: House Home Access: Stairs to enter   [employer] of Steps: 1   Home Layout: One level Home Equipment: None      Prior Function Prior Level of Function : Needs assist       Physical Assist : ADLs (physical)       ADLs Comments: assist for homemaking and driving, bathes and dresses on her own     Hand Dominance        Extremity/Trunk Assessment   Upper Extremity Assessment Upper Extremity Assessment: Defer to OT evaluation    Lower Extremity Assessment Lower Extremity Assessment: RLE deficits/detail RLE Deficits / Details: right knee flexion contracture grossly 20 degree ROM which pt reports is baseline post TKA    Cervical / Trunk Assessment Cervical / Trunk Assessment: Normal  Communication   Communication: Interpreter utilized;Deaf  Cognition Arousal/Alertness: Awake/alert Behavior During Therapy: WFL for tasks assessed/performed Overall Cognitive Status: Impaired/Different from baseline Area of Impairment: Safety/judgement                         Safety/Judgement: Decreased awareness of deficits     General Comments: pt needing cues for balance and safety        General Comments      Exercises     Assessment/Plan    PT Assessment Patient needs continued PT services  PT Problem List Decreased strength;Decreased mobility;Decreased safety awareness;Decreased activity tolerance;Decreased balance;Decreased knowledge of use of DME       PT Treatment Interventions DME instruction;Gait training;Balance training;Therapeutic exercise;Stair training;Functional mobility training;Patient/family education;Therapeutic activities    PT Goals (Current goals can be found in the Care Plan section)  Acute Rehab PT Goals Patient Stated Goal: return home, watch tv, walk PT Goal Formulation: With patient Time  For Goal Achievement: 06-21-22 Potential to Achieve Goals: Good    Frequency Min 3X/week     Co-evaluation               AM-PAC PT "6 Clicks" Mobility  Outcome Measure Help needed turning from your back to your side while in a flat bed without using bedrails?: A Little Help needed moving from lying on your back to sitting on the side of a flat bed without using bedrails?: A Lot Help needed moving to and from a bed to a chair (including a wheelchair)?: A Little Help needed standing up from a chair using your arms (e.g., wheelchair or bedside chair)?: A Little Help needed to walk in hospital room?: A Little Help needed climbing 3-5 steps with a railing? : A Lot 6 Click Score: 16    End of Session Equipment Utilized During Treatment: Gait belt Activity Tolerance: Patient tolerated treatment well Patient left: in bed;with call bell/phone within reach;with family/visitor present (returned to bed for test) Nurse Communication: Mobility status PT Visit Diagnosis: Other abnormalities of gait and mobility (R26.89);Difficulty in walking, not elsewhere classified (R26.2);Unsteadiness on feet (R26.81)    Time: 1017-5102 PT Time Calculation (min) (ACUTE ONLY): 29 min   Charges:   PT Evaluation $PT Eval Moderate Complexity: 1 Mod PT Treatments $Therapeutic Activity: 8-22 mins        Finnleigh Marchetti P, PT Acute Rehabilitation Services Pager: [phone] Office: [phone]   Deklyn Gibbon B Zaviyar Rahal 05/23/2022, 11:01 AM

## 2022-05-23 NOTE — Progress Notes (Signed)
Progress Note   Patient: Veronica Young UXN:[id number] DOB: 09-16-1933 DOA: 05/20/2022     3 DOS: the patient was seen and examined on 05/23/2022   Brief hospital course: Veronica Young was admitted to the hospital with the working diagnosis of chest pain, complicated with congested heart failure.   88 yo female with congential deafness, CKD stage 3b, diastolic heart failure, atrial fibrillation, sick sinus syndrome sp PPM, coronary artery disease, hypertension, dyslipidemia and T2DM who presented with chest pain. Reported constant chest pain, upper back and upper chest, not exertion related and worse with vomiting. In the Ed her 02 saturation was 84% on 2 L/min per [place], her blood pressure 104/55, HR 80, RR 26, lungs with no wheezing, heart with S1 and S2 present with no gallops, abdomen not distended and positive lower extremity edema.   Na 135, K 3,9, Cl 93, bicarbonate 31, glucose 122, bun 38 cr 1,53 BNP 690  Wbc 8.9 hgb 12.7 plt 111  TSH 0,296   Chest radiograph with cardiomegaly, with mild hilar vascular congestion, no infiltrates, pacemaker with one right atrial and ventricular lead.   CT chest and abdomen with no evidence of dissection or aneurysm, small pericardial effusion, small bilateral pleural effusions, no acute changes in the abdomen.  EKG 89 right axis deviation, normal qtc, atrial fibrillation rhythm, with no significant ST segment changes, negative T wave in V5 and V6.   Patient was placed on furosemide for diuresis.  Worsening renal function and worsening oxygenation.  05/31 acute worsening in mental status, CT head with no signs of acute CVA.   Assessment and Plan: * Acute on chronic diastolic CHF (congestive heart failure) (HCC) Echocardiogram with LV EF preserved >75% with severe concentric LVH, moderate reduction in RV systolic function, moderate enlargement in RV cavity, severe bilateral atrial enlargement. Trivial pericardial effusion.   Acute on chronic core  pulmonale, possible primary pulmonary hypertension.   Urine output documented is 950 cc. Systolic blood pressure 254 to 120 mmHg.   Continue blood pressure support with midodrine. Clinically euvolemic, will continue to hold on furosemide today. She had one dose of IV furosemide last night.    Chest pain Possible musculoskeletal Patient has distal interphalangeal deformities, possible RA.  Continue with acetaminophen and tramadol for pain control. Out of bed to chair tid with meals.    Acute on chronic respiratory failure with hypoxia (HCC) Follow up chest radiograph personally review with enlarged pulmonary vasculature, positive atelectasis at the right lower lobe.   02 saturation this am is 94% on 7 HFNC.  Plan to continue with incentive spirometer and supplemental 02 per  to keep 02 saturation 88%.  Continue to hold on furosemide for now.   Congenital deafness Sing interpreter at the bedside.   Acute kidney injury superimposed on chronic kidney disease (HCC) CKD stage 3b. Hyponatremia.   Improved volume status.  Renal function today with serum cr at 1,98 with K at 3,9 and serum bicarbonate at 28. Plan to continue close monitoring of renal function and electrolytes.  Hold on furosemide for today.    T2DM (type 2 diabetes mellitus) (HCC) Fasting glucose this am is 106 mg/dl.  Continue insulin sliding scale for glucose cover and monitoring.   Anxiety Depression. Continue with escitalopram. Patient not on alprazolam at home, she had oversedation yesterday from this medication. Will avoid further benzodiazepines.   Protein-calorie malnutrition, severe Continue with nutritional supplements.   Hypothyroidism Low TSH, but clinically euthyroid Free T4 is mildly elevated at 1,19,  will continue current dose of levothyroxine and plan to follow up thyroid function as outpatient.    Acute metabolic encephalopathy Multifactorial encephalopathy.   Patient fell yesterday. No  head trauma.   This am she is non focal and has preserved strength upper and lower extremities.  Neurology was consulted last night.  Continue to follow on recommendations.  Fall precautions and neuro checks per unit protocol.         Subjective: Patient with improved mentation. Continue to be very weak and deconditioned.   Physical Exam: Vitals:   05/23/22 0402 05/23/22 0741 05/23/22 0904 05/23/22 0915  BP: 109/67 120/72    Pulse: 64 69 70   Resp: (!) 24 (!) 22 20   Temp: 97.9 F (36.6 C) 97.6 F (36.4 C)    TempSrc: Oral Oral    SpO2: 94% 94% 95% 94%  Weight: 53 kg     Height:       Neurology awake and alert ENT with no pallor Cardiovascular with S1 and S2 present and rhythmic, no gallops, or rubs No JVD No lower extremity edema  Respiratory with no wheezing or rales Abdomen not distended Upper and lower extremity strength 5/5, follows commands and responds to questions.   Data Reviewed:    Family Communication: I spoke with patient's daughter at the bedside, we talked in detail about patient's condition, plan of care and prognosis and all questions were addressed.   Disposition: Status is: Inpatient Remains inpatient appropriate because: hypoxemia   Planned Discharge Destination: Home    Author: Tawni Millers, MD 05/23/2022 11:08 AM  For on call review [email or web address].

## 2022-05-23 NOTE — Progress Notes (Addendum)
Heart Failure Navigator Progress Note  Assessed for Heart & Vascular TOC clinic readiness.  Patient does not meet criteria due to Per Dr. Cathlean Sauer, EF 75%, Pulmonary HTN. Refused thoracentesis, home with Palliative care. ,     Earnestine Leys, BSN, Clinical cytogeneticist Only

## 2022-05-23 NOTE — Progress Notes (Addendum)
Progress Note  Patient Name: Veronica Young Date of Encounter: 05/23/2022  [facility] Cardiologist: Peter [name], MD   Subjective   Sitting up in the bed. Daughter at the bedside. Sign language interpretor present. No chest pain, breathing seems    Inpatient Medications    Scheduled Meds:  apixaban  2.5 mg Oral BID   atropine  1 drop Left Eye QHS   brimonidine  1 drop Both Eyes BID   escitalopram  10 mg Oral Daily   feeding supplement  237 mL Oral TID BM   gabapentin  100 mg Oral QHS   insulin aspart  0-5 Units Subcutaneous QHS   insulin aspart  0-9 Units Subcutaneous TID WC   latanoprost  1 drop Both Eyes BID   levothyroxine  100 mcg Oral Q0600   midodrine  10 mg Oral BID WC   Netarsudil Dimesylate  1 drop Left Eye QHS   Continuous Infusions:  PRN Meds: acetaminophen **OR** acetaminophen   Vital Signs    Vitals:   05/22/22 2056 05/22/22 2300 05/23/22 0402 05/23/22 0741  BP: 125/67 120/74 109/67 120/72  Pulse: 69 76 64 69  Resp: 19 (!) 25 (!) 24 (!) 22  Temp:  98 F (36.7 C) 97.9 F (36.6 C) 97.6 F (36.4 C)  TempSrc:  Oral Oral Oral  SpO2: 94% 92% 94% 94%  Weight:   53 kg   Height:        Intake/Output Summary (Last 24 hours) at 05/23/2022 0829 Last data filed at 05/22/2022 1856 Gross per 24 hour  Intake --  Output 750 ml  Net -750 ml      05/23/2022    4:02 AM 05/22/2022    4:32 AM 05/20/2022   11:45 PM  Last 3 Weights  Weight (lbs) 116 lb 13.5 oz 115 lb 1.6 oz 113 lb 12.8 oz  Weight (kg) 53 kg 52.209 kg 51.619 kg      Telemetry    Atrial Fib rate 80s - Personally Reviewed  ECG    No new tracing this morning  Physical Exam   GEN:  Thin, frail older female, sitting up in bed. On 7L [place] Neck: No JVD Cardiac: Irreg Irreg, no murmurs, rubs, or gallops.  Respiratory: Clear to auscultation bilaterally. GI: Soft, nontender, non-distended  MS: No edema; No deformity. Neuro:  Nonfocal  Psych: Normal affect   Labs    High Sensitivity  Troponin:   Recent Labs  Lab 05/20/22 1742 05/20/22 1942  TROPONINIHS 15 16     Chemistry Recent Labs  Lab 05/20/22 1742 05/21/22 0647 05/22/22 0249 05/23/22 0204  NA 135 135 131* 132*  K 3.9 3.8 4.3 3.9  CL 93* 95* 97* 96*  CO2 31 31 27 28   GLUCOSE 122* 110* 152* 106*  BUN 38* 40* 53* 53*  CREATININE 1.53* 1.76* 2.01* 1.98*  CALCIUM 9.0 9.0 8.7* 8.6*  MG 2.1  --   --  2.4  PROT 7.4 6.7 6.3*  --   ALBUMIN 3.6 3.1* 2.9*  --   AST 43* 38 32  --   ALT 23 22 21   --   ALKPHOS 163* 149* 142*  --   BILITOT 2.0* 1.9* 1.4*  --   GFRNONAA 33* 28* 24* 24*  ANIONGAP 11 9 7 8     Lipids  Recent Labs  Lab 05/23/22 0204  CHOL 182  TRIG 85  HDL 29*  LDLCALC 136*  CHOLHDL 6.3    Hematology Recent Labs  Lab 05/20/22 1742 05/21/22 0647 05/22/22 0249  WBC 8.9 9.1 10.1  RBC 4.47 4.26 3.93  HGB 12.7 12.1 11.1*  HCT 39.5 37.2 34.7*  MCV 88.4 87.3 88.3  MCH 28.4 28.4 28.2  MCHC 32.2 32.5 32.0  RDW 18.3* 18.3* 18.4*  PLT 111* 102* 115*   Thyroid  Recent Labs  Lab 05/20/22 1742 05/23/22 0204  TSH 0.296*  --   FREET4  --  1.19*    BNP Recent Labs  Lab 05/20/22 1742  BNP 690.5*    DDimer No results for input(s): DDIMER in the last 168 hours.   Radiology    CT HEAD WO CONTRAST (5MM)  Result Date: 05/22/2022 CLINICAL DATA:  Initial evaluation for neuro deficit, stroke suspected. EXAM: CT HEAD WITHOUT CONTRAST TECHNIQUE: Contiguous axial images were obtained from the base of the skull through the vertex without intravenous contrast. RADIATION DOSE REDUCTION: This exam was performed according to the departmental dose-optimization program which includes automated exposure control, adjustment of the mA and/or kV according to patient size and/or use of iterative reconstruction technique. COMPARISON:  Prior CT from 09/19/2013. FINDINGS: Brain: Generalized age-related cerebral atrophy with mild chronic small vessel ischemic disease. No acute intracranial hemorrhage. No acute  large vessel territory infarct. No mass lesion, midline shift or mass effect. No hydrocephalus or extra-axial fluid collection. Vascular: No hyperdense vessel. Calcified atherosclerosis present about the skull base. Skull: No acute scalp soft tissue abnormality. Small nodular calcification at the right parieto-occipital scalp noted, nonspecific, but likely benign. Calvarium intact. Sinuses/Orbits: Globes and orbital soft tissues demonstrate no acute finding. Paranasal sinuses mastoid air cells are largely clear. Other: 10. IMPRESSION: 1. No acute intracranial abnormality. No hemorrhage or large vessel territory infarct by CT. 2. Generalized age-related cerebral atrophy with mild chronic small vessel ischemic disease. Electronically Signed   By: Jeannine Boga M.D.   On: 05/22/2022 22:22   DG CHEST PORT 1 VIEW  Result Date: 05/23/2022 CLINICAL DATA:  Acute and chronic respiratory failure with hypoxia. EXAM: PORTABLE CHEST 1 VIEW COMPARISON:  05/20/2022. FINDINGS: 4:26 a.m. 05/23/2022. Left chest pacing system and wire insertions are unaltered. The heart moderately enlarged with persistent mild perihilar vascular congestion and lower zonal interstitial edema with small but interval increased pleural effusions. There are stable hazy opacities in the lower lung fields consistent with ground-glass edema, pneumonitis or combination. There is increased band consolidation in the right lower lung field today which could be atelectasis or a small pneumonia. The mid and upper lungs clear with COPD change. In all other respects no further changes. Stable aortic tortuosity with ectasia. IMPRESSION: 1. Increased opacity in the right lower lung field consistent with atelectasis or focal pneumonia. 2. Basilar interstitial edema and small interval increased pleural effusions with hazy overlying opacities consistent with edema or pneumonia. 3. Cardiomegaly. Electronically Signed   By: Telford Nab M.D.   On: 05/23/2022  06:28   ECHOCARDIOGRAM COMPLETE  Result Date: 05/21/2022    ECHOCARDIOGRAM REPORT   Patient Name:   Veronica Young Date of Exam: 05/21/2022 Medical Rec #:  [id number]      Height:       65.0 in Accession #:    [id number]     Weight:       113.8 lb Date of Birth:  1933/04/05     BSA:          1.557 m Patient Age:    63 years       BP:  115/64 mmHg Patient Gender: F              HR:           72 bpm. Exam Location:  Inpatient Procedure: 2D Echo, Cardiac Doppler and Color Doppler Indications:    CHF  History:        Patient has prior history of Echocardiogram examinations, most                 recent 01/14/2022. CHF, Pacemaker; Risk Factors:Diabetes and                 Dyslipidemia.  Sonographer:    Luisa Hart RDCS Referring Phys: [id number] Shela Leff  Sonographer Comments: 07/26/2014 pacemaker IMPRESSIONS  1. Compared to echo from Jan 2023, no significant change in LVEF and RVEF.  2. Left ventricular ejection fraction, by estimation, is >75%. The left ventricle has hyperdynamic function. The left ventricle has no regional wall motion abnormalities. There is severe concentric left ventricular hypertrophy. Left ventricular diastolic parameters are indeterminate.  3. Right ventricular systolic function is moderately reduced. The right ventricular size is moderately enlarged.  4. Left atrial size was severely dilated.  5. Right atrial size was severely dilated.  6. The mitral valve is normal in structure. Mild mitral valve regurgitation.  7. The aortic valve is tricuspid. Aortic valve regurgitation is not visualized. Aortic valve sclerosis is present, with no evidence of aortic valve stenosis.  8. The inferior vena cava is dilated in size with <50% respiratory variability, suggesting right atrial pressure of 15 mmHg. FINDINGS  Left Ventricle: Left ventricular ejection fraction, by estimation, is >75%. The left ventricle has hyperdynamic function. The left ventricle has no regional wall motion  abnormalities. The left ventricular internal cavity size was small. There is severe concentric left ventricular hypertrophy. Left ventricular diastolic parameters are indeterminate. Right Ventricle: The right ventricular size is moderately enlarged. Right vetricular wall thickness was not assessed. Right ventricular systolic function is moderately reduced. Left Atrium: Left atrial size was severely dilated. Right Atrium: Right atrial size was severely dilated. Pericardium: Trivial pericardial effusion is present. Mitral Valve: The mitral valve is normal in structure. Mild mitral valve regurgitation. Tricuspid Valve: The tricuspid valve is normal in structure. Tricuspid valve regurgitation is mild. Aortic Valve: The aortic valve is tricuspid. Aortic valve regurgitation is not visualized. Aortic valve sclerosis is present, with no evidence of aortic valve stenosis. Aortic valve mean gradient measures 2.3 mmHg. Aortic valve peak gradient measures 4.4  mmHg. Aortic valve area, by VTI measures 3.46 cm. Pulmonic Valve: The pulmonic valve was normal in structure. Pulmonic valve regurgitation is not visualized. Aorta: The aortic root and ascending aorta are structurally normal, with no evidence of dilitation. Venous: The inferior vena cava is dilated in size with less than 50% respiratory variability, suggesting right atrial pressure of 15 mmHg. IAS/Shunts: No atrial level shunt detected by color flow Doppler. Additional Comments: A device lead is visualized.  LEFT VENTRICLE PLAX 2D LVIDd:         3.20 cm     Diastology LVIDs:         1.80 cm     LV e' medial:    5.56 cm/s LV PW:         1.50 cm     LV E/e' medial:  17.2 LV IVS:        1.80 cm     LV e' lateral:   7.37 cm/s LVOT diam:     2.30  cm     LV E/e' lateral: 13.0 LV SV:         65 LV SV Index:   42 LVOT Area:     4.15 cm  LV Volumes (MOD) LV vol d, MOD A2C: 23.7 ml LV vol d, MOD A4C: 24.9 ml LV vol s, MOD A2C: 11.9 ml LV vol s, MOD A4C: 10.1 ml LV SV MOD A2C:      11.8 ml LV SV MOD A4C:     24.9 ml LV SV MOD BP:      13.6 ml RIGHT VENTRICLE RV Basal diam:  3.50 cm RV Mid diam:    1.80 cm RV S prime:     7.74 cm/s TAPSE (M-mode): 1.3 cm LEFT ATRIUM             Index        RIGHT ATRIUM           Index LA Vol (A2C):   40.5 ml 26.02 ml/m  RA Area:     22.20 cm LA Vol (A4C):   66.2 ml 42.53 ml/m  RA Volume:   67.20 ml  43.17 ml/m LA Biplane Vol: 55.2 ml 35.46 ml/m  AORTIC VALVE                    PULMONIC VALVE AV Area (Vmax):    3.34 cm     PV Vmax:       0.74 m/s AV Area (Vmean):   3.24 cm     PV Vmean:      51.575 cm/s AV Area (VTI):     3.46 cm     PV VTI:        0.134 m AV Vmax:           104.67 cm/s  PV Peak grad:  2.2 mmHg AV Vmean:          71.233 cm/s  PV Mean grad:  1.0 mmHg AV VTI:            0.187 m AV Peak Grad:      4.4 mmHg AV Mean Grad:      2.3 mmHg LVOT Vmax:         84.20 cm/s LVOT Vmean:        55.500 cm/s LVOT VTI:          0.156 m LVOT/AV VTI ratio: 0.83  AORTA Ao Root diam: 2.90 cm Ao Asc diam:  3.50 cm MV E velocity: 95.67 cm/s  TRICUSPID VALVE                            TR Peak grad:   68.9 mmHg                            TR Vmax:        415.00 cm/s                             SHUNTS                            Systemic VTI:  0.16 m                            Systemic Diam: 2.30 cm Dorris Carnes MD  Electronically signed by Dorris Carnes MD Signature Date/Time: 05/21/2022/10:53:17 AM    Final     Cardiac Studies   Echo: 05/21/22   IMPRESSIONS     1. Compared to echo from Jan 2023, no significant change in LVEF and  RVEF.   2. Left ventricular ejection fraction, by estimation, is >75%. The left  ventricle has hyperdynamic function. The left ventricle has no regional  wall motion abnormalities. There is severe concentric left ventricular  hypertrophy. Left ventricular  diastolic parameters are indeterminate.   3. Right ventricular systolic function is moderately reduced. The right  ventricular size is moderately enlarged.   4. Left atrial  size was severely dilated.   5. Right atrial size was severely dilated.   6. The mitral valve is normal in structure. Mild mitral valve  regurgitation.   7. The aortic valve is tricuspid. Aortic valve regurgitation is not  visualized. Aortic valve sclerosis is present, with no evidence of aortic  valve stenosis.   8. The inferior vena cava is dilated in size with <50% respiratory  variability, suggesting right atrial pressure of 15 mmHg.   FINDINGS   Left Ventricle: Left ventricular ejection fraction, by estimation, is  >75%. The left ventricle has hyperdynamic function. The left ventricle has  no regional wall motion abnormalities. The left ventricular internal  cavity size was small. There is severe  concentric left ventricular hypertrophy. Left ventricular diastolic  parameters are indeterminate.   Right Ventricle: The right ventricular size is moderately enlarged. Right  vetricular wall thickness was not assessed. Right ventricular systolic  function is moderately reduced.   Left Atrium: Left atrial size was severely dilated.   Right Atrium: Right atrial size was severely dilated.   Pericardium: Trivial pericardial effusion is present.   Mitral Valve: The mitral valve is normal in structure. Mild mitral valve  regurgitation.   Tricuspid Valve: The tricuspid valve is normal in structure. Tricuspid  valve regurgitation is mild.   Aortic Valve: The aortic valve is tricuspid. Aortic valve regurgitation is  not visualized. Aortic valve sclerosis is present, with no evidence of  aortic valve stenosis. Aortic valve mean gradient measures 2.3 mmHg.  Aortic valve peak gradient measures 4.4   mmHg. Aortic valve area, by VTI measures 3.46 cm.   Pulmonic Valve: The pulmonic valve was normal in structure. Pulmonic valve  regurgitation is not visualized.   Aorta: The aortic root and ascending aorta are structurally normal, with  no evidence of dilitation.   Venous: The inferior  vena cava is dilated in size with less than 50%  respiratory variability, suggesting right atrial pressure of 15 mmHg.   IAS/Shunts: No atrial level shunt detected by color flow Doppler.   Additional Comments: A device lead is visualized.   Patient Profile     88 y.o. female with a hx of congenital deafness, chronic diastolic CHF, persistent atrial fibrillation on eliquis, paroxysmal atrial tachycardia, sick sinus syndrome s/p [employer] PPM, CAD, PAD, HTN, HLD, type 2 DM, hypothyroidism CKD stage IIIb who was seen 05/21/2022 for the evaluation of CHF, chest pain, atrial fibrillation at the request of Dr. Broadus John.  Assessment & Plan    Acute on Chronic hypoxic respiratory failure/Acute on Chronic diastolic heart failure: BNP was elevated to 690.5, CXR with cardiomegaly and mild intersitial pulmonary edema/CT chest with small bilateral effusions. Echo showed LVEF of >75%, LVH, moderately reduced RV, severe biatrial enlargement. -- treated with IV lasix, held yesterday morning with rise in Cr. Given IV 40mg   last evening with increased O2 requirement.  -- CXR this am suggestive of RLL atelectasis vs focal PNA, small pleural effusions -- I&Os not accurate -- will hold additional lasix for now    Small Pericardial Effusion: Noted on CTA chest/abd/pelvis  -- trivial pericardial effusion on echocardiogram, suspect chronic   CKD stage III: Cr baseline around 1.5-1.7, up to 2.01>>1.98  -- follow BMET   Chest Pain: reported having chest tightness when she would deep breathe. Denied any positional chest pain.  -- hsTn negative   Persistent A-Fib: Continue eliquis 2.5 mg BID (dose adjusted due to age, weight). Severe biatrial enlargement on echo -- BB was held on admission 2/2 soft blood pressures, remains rate control -- on midodrine 10mg  BID  Fall/right sided weakness: had unwitnessed fall yesterday. CT head negative for bleed.  -- suspect symptoms related to receiving Xanax, resolved this  morning -- seen by neuro yesterday evening   Otherwise per primary  - Type 2 DM  - Hypothyroidism  - Mild thrombocytopenia  - Mild elevation in liver enzymes  - PAD    For questions or updates, please contact [facility] Please consult [email or web address] for contact info under        Signed, Reino Bellis, NP  05/23/2022, 8:29 AM     ATTENDING ATTESTATION:  After conducting a review of all available clinical information with the care team, interviewing the patient, and performing a physical exam, I agree with the findings and plan described in this note.   GEN: No acute distress.   HEENT:  Dry MM, no JVD, no scleral icterus,  Cardiac: Irregular RR, no murmurs, rubs, or gallops.  Respiratory: Clear to auscultation bilaterally. GI: Soft, nontender, non-distended  MS: No edema; No deformity. Neuro:  Nonfocal  Vasc:  +2 radial pulses  Patient had an eventful night.  She was thought to have developed an acute stroke and neurology was consulted and they thought this was due to her Xanax dose.  A CT of the head was negative.  Additionally she experienced desaturations and was treated with Lasix IV x1.  A chest x-ray demonstrated possible infiltrate versus edema.  On exam today the patient seems to be dry.  Her creatinine is around 2.  She denies any shortness of breath.  Her atrial fibrillation rate remains controlled.  I think if she has issues with oxygenation then a noncontrast CT of the chest should be obtained or a 2 view chest x-ray.  I do not think that she is volume overloaded.  Continue to hold Lasix at this time given acute kidney injury.  I conducted my interview through the patient's daughter who is also deaf via written communication.   Lenna Sciara, MD Pager [phone]

## 2022-05-23 NOTE — Care Management Important Message (Signed)
Important Message  Patient Details  Name: Veronica Young MRN: [id number] Date of Birth: November 19, 1933   Medicare Important Message Given:  Yes     [name] 05/23/2022, 9:19 AM

## 2022-05-24 ENCOUNTER — Inpatient Hospital Stay (HOSPITAL_COMMUNITY): Payer: Medicare HMO

## 2022-05-24 DIAGNOSIS — H905 Unspecified sensorineural hearing loss: Secondary | ICD-10-CM | POA: Diagnosis not present

## 2022-05-24 DIAGNOSIS — N179 Acute kidney failure, unspecified: Secondary | ICD-10-CM | POA: Diagnosis not present

## 2022-05-24 DIAGNOSIS — F419 Anxiety disorder, unspecified: Secondary | ICD-10-CM | POA: Diagnosis not present

## 2022-05-24 DIAGNOSIS — G9341 Metabolic encephalopathy: Secondary | ICD-10-CM | POA: Diagnosis not present

## 2022-05-24 DIAGNOSIS — I5033 Acute on chronic diastolic (congestive) heart failure: Secondary | ICD-10-CM | POA: Diagnosis not present

## 2022-05-24 LAB — BASIC METABOLIC PANEL
Anion gap: 8 (ref 5–15)
BUN: 50 mg/dL — ABNORMAL HIGH (ref 8–23)
CO2: 28 mmol/L (ref 22–32)
Calcium: 8.6 mg/dL — ABNORMAL LOW (ref 8.9–10.3)
Chloride: 98 mmol/L (ref 98–111)
Creatinine, Ser: 1.7 mg/dL — ABNORMAL HIGH (ref 0.44–1.00)
GFR, Estimated: 29 mL/min — ABNORMAL LOW (ref >60)
Glucose, Bld: 182 mg/dL — ABNORMAL HIGH (ref 70–99)
Potassium: 4 mmol/L (ref 3.5–5.1)
Sodium: 134 mmol/L — ABNORMAL LOW (ref 135–145)

## 2022-05-24 LAB — PROCALCITONIN: Procalcitonin: 1.69 ng/mL

## 2022-05-24 LAB — GLUCOSE, CAPILLARY
Glucose-Capillary: 97 mg/dL (ref 70–99)
Glucose-Capillary: 181 mg/dL — ABNORMAL HIGH (ref 70–99)
Glucose-Capillary: 83 mg/dL (ref 70–99)
Glucose-Capillary: 152 mg/dL — ABNORMAL HIGH (ref 70–99)

## 2022-05-24 LAB — T3, FREE: T3, Free: 0.9 pg/mL — ABNORMAL LOW (ref 2.0–4.4)

## 2022-05-24 MED ORDER — FUROSEMIDE 10 MG/ML IJ SOLN
40.0000 mg | Freq: Once | INTRAMUSCULAR | Status: AC
  Administered 2022-05-24: 40 mg via INTRAVENOUS
  Filled 2022-05-24: qty 4

## 2022-05-24 MED ORDER — FLUOROMETHOLONE 0.1 % OP SUSP
1.0000 [drp] | Freq: Four times a day (QID) | OPHTHALMIC | Status: DC
  Administered 2022-05-24 – 2022-05-27 (×10): 1 [drp] via OPHTHALMIC
  Filled 2022-05-24: qty 5

## 2022-05-24 NOTE — Progress Notes (Addendum)
Progress Note   Patient: Veronica Young [id number]:[id number] DOB: Aug 30, 1933 DOA: 05/20/2022     4 DOS: the patient was seen and examined on 05/24/2022   Brief hospital course: Veronica Young was admitted to the hospital with the working diagnosis of chest pain, complicated with congested heart failure.   88 yo female with congential deafness, CKD stage 3b, diastolic heart failure, atrial fibrillation, sick sinus syndrome sp PPM, coronary artery disease, hypertension, dyslipidemia and T2DM who presented with chest pain. Reported constant chest pain, upper back and upper chest, not exertion related and worse with vomiting. In the Ed her 02 saturation was 84% on 2 L/min per [place], her blood pressure 104/55, HR 80, RR 26, lungs with no wheezing, heart with S1 and S2 present with no gallops, abdomen not distended and positive lower extremity edema.   Na 135, K 3,9, Cl 93, bicarbonate 31, glucose 122, bun 38 cr 1,53 BNP 690  Wbc 8.9 hgb 12.7 plt 111  TSH 0,296   Chest radiograph with cardiomegaly, with mild hilar vascular congestion, no infiltrates, pacemaker with one right atrial and ventricular lead.   CT chest and abdomen with no evidence of dissection or aneurysm, small pericardial effusion, small bilateral pleural effusions, no acute changes in the abdomen.  EKG 89 right axis deviation, normal qtc, atrial fibrillation rhythm, with no significant ST segment changes, negative T wave in V5 and V6.   Patient was placed on furosemide for diuresis.  Worsening renal function and worsening oxygenation.  05/31 acute worsening in mental status, CT head with no signs of acute CVA.  06/01 follow up head CT negative for acute CVA. Neurology recommendations to continue statin and anticoagulation with apixaban.   06/02 continue to have high oxygen requirements, on 6 L per HFNC with 02 saturation 88%.    Assessment and Plan: * Acute on chronic diastolic CHF (congestive heart failure) (HCC) Echocardiogram with LV  EF preserved >75% with severe concentric LVH, moderate reduction in RV systolic function, moderate enlargement in RV cavity, severe bilateral atrial enlargement. Trivial pericardial effusion.   Acute on chronic core pulmonale, possible primary pulmonary hypertension.   Urine output documented is 750 cc. Systolic blood pressure 381 to 125 mmHg.   Blood pressure support with midodrine. Patient's family declined invasive testing, cardiac catheterization.    Chest pain Possible musculoskeletal Patient has distal interphalangeal deformities, possible RA.  Continue with acetaminophen and tramadol for pain control. Out of bed to chair tid with meals.    Acute on chronic respiratory failure with hypoxia (HCC) Follow up chest radiograph personally review with enlarged pulmonary vasculature, positive atelectasis at the right lower lobe.   02 saturation this am is 96% on 6 HFNC.  Plan to continue with incentive spirometer and supplemental 02 per [place] to keep 02 saturation 88%.   CT chest non contrast to further evaluate lung parenchyma.  Likely pulmonary hypertension driving persistent hypoxemic respiratory failure.  Patient very weak and frail, poor prognosis.   Congenital deafness Sing interpreter at the bedside.   Acute kidney injury superimposed on chronic kidney disease (HCC) CKD stage 3b. Hyponatremia.   Renal function with serum cr at 1,70 with K at 4,0 and serum bicarbonate at 28.  Plan to continue close follow up on renal function and electrolytes.  Avoid hypotension and nephrotoxic medications.   T2DM (type 2 diabetes mellitus) (HCC) Fasting glucose this am is 182 mg/dl.  Continue insulin sliding scale for glucose cover and monitoring.   Anxiety Depression. Continue with  escitalopram. Patient not on alprazolam at home, she had oversedation 05/31 from this medication. Will avoid further benzodiazepines.   Protein-calorie malnutrition, severe Continue with nutritional  supplements.   Hypothyroidism Low TSH, but clinically euthyroid Free T4 is mildly elevated at 1,19, will continue current dose of levothyroxine and plan to follow up thyroid function as outpatient.    Acute metabolic encephalopathy Multifactorial encephalopathy.   Patient fell 05/31. No head trauma.   Her neurologic deficit of right facial droop is chronic, follow up head CT with no CVA. Plan to continue anticoagulation with apixaban and statin therapy. Close blood pressure monitoring.  Continue neuro checks per unit protocol.         Subjective: Patient is feeling better, continue to use high flow nasal cannula, no chest pain or dyspnea  Interpreter at the bedside   Physical Exam: Vitals:   05/24/22 0546 05/24/22 0547 05/24/22 0742 05/24/22 1216  BP:   133/76 125/65  Pulse: 78 73 88 74  Resp: (!) 22 20 20 16   Temp:   97.8 F (36.6 C) (!) 97.3 F (36.3 C)  TempSrc:   Oral Axillary  SpO2: 92% 91% 92% 96%  Weight:      Height:       Neurology awake and alert ENT with mild pallor Cardiovascular with S1 and S2 present and rhythmic, no gallops, positive murmur at the right sternal border systolic, positive JVD, no lower extremity edema  Respiratory with no rales or wheezing Abdomen not distended  Data Reviewed:    Family Communication: I spoke with patient's daughter at the bedside, we talked in detail about patient's condition, plan of care and prognosis and all questions were addressed. I spoke over the phone with the patient's  about patient's  condition, plan of care, prognosis and all questions were addressed.    Disposition: Status is: Inpatient Remains inpatient appropriate because: hypoxemic respiratory failure   Planned Discharge Destination: Home  Author: Tawni Millers, MD 05/24/2022 1:39 PM  For on call review [email or web address].

## 2022-05-24 NOTE — Progress Notes (Signed)
CT chest personally reviewed. Noted bilateral ground glass opacities and bilateral pleural effusions more left than right. Bibasilar atelectasis related to pleural effusions.  Plan to continue diuresis with furosemide. Noted elevated pro calcitonine, but doubt infectious process. Will check WBC and continue close monitoring.  Kepp 02 saturation 88% or greater.

## 2022-05-24 NOTE — Progress Notes (Signed)
STROKE TEAM PROGRESS NOTE   SUBJECTIVE (INTERVAL HISTORY) No family is at the bedside. Pt resting comfortably, open eyes with voice and tactile stimulation. No acute event overnight. CT repeat this morning no acute finding.    OBJECTIVE Temp:  [97.3 F (36.3 C)-99 F (37.2 C)] 97.3 F (36.3 C) (06/02 1216) Pulse Rate:  [69-88] 74 (06/02 1216) Cardiac Rhythm: Atrial fibrillation (06/02 0700) Resp:  [16-22] 16 (06/02 1216) BP: (111-133)/(65-76) 125/65 (06/02 1216) SpO2:  [91 %-100 %] 96 % (06/02 1216) Weight:  [52.5 kg] 52.5 kg (06/02 0314)  Recent Labs  Lab 05/23/22 1132 05/23/22 1628 05/23/22 2140 05/24/22 0807 05/24/22 1214  GLUCAP 127* 156* 135* 97 181*   Recent Labs  Lab 05/20/22 1742 05/21/22 0647 05/22/22 0249 05/23/22 0204 05/24/22 1112  NA 135 135 131* 132* 134*  K 3.9 3.8 4.3 3.9 4.0  CL 93* 95* 97* 96* 98  CO2 31 31 27 28 28   GLUCOSE 122* 110* 152* 106* 182*  BUN 38* 40* 53* 53* 50*  CREATININE 1.53* 1.76* 2.01* 1.98* 1.70*  CALCIUM 9.0 9.0 8.7* 8.6* 8.6*  MG 2.1  --   --  2.4  --    Recent Labs  Lab 05/20/22 1742 05/21/22 0647 05/22/22 0249  AST 43* 38 32  ALT 23 22 21   ALKPHOS 163* 149* 142*  BILITOT 2.0* 1.9* 1.4*  PROT 7.4 6.7 6.3*  ALBUMIN 3.6 3.1* 2.9*   Recent Labs  Lab 05/20/22 1742 05/21/22 0647 05/22/22 0249  WBC 8.9 9.1 10.1  NEUTROABS 6.9  --   --   HGB 12.7 12.1 11.1*  HCT 39.5 37.2 34.7*  MCV 88.4 87.3 88.3  PLT 111* 102* 115*   No results for input(s): CKTOTAL, CKMB, CKMBINDEX, TROPONINI in the last 168 hours. No results for input(s): LABPROT, INR in the last 72 hours. No results for input(s): COLORURINE, LABSPEC, [place], GLUCOSEU, HGBUR, BILIRUBINUR, KETONESUR, PROTEINUR, UROBILINOGEN, NITRITE, LEUKOCYTESUR in the last 72 hours.  Invalid input(s): APPERANCEUR     Component Value Date/Time   CHOL 182 05/23/2022 0204   TRIG 85 05/23/2022 0204   HDL 29 (L) 05/23/2022 0204   CHOLHDL 6.3 05/23/2022 0204   VLDL 17  05/23/2022 0204   LDLCALC 136 (H) 05/23/2022 0204   Lab Results  Component Value Date   HGBA1C 6.1 (H) 05/21/2022   No results found for: LABOPIA, COCAINSCRNUR, LABBENZ, AMPHETMU, THCU, LABBARB  No results for input(s): ETH in the last 168 hours.  I have personally reviewed the radiological images below and agree with the radiology interpretations.  CT HEAD WO CONTRAST (5MM)  Result Date: 05/24/2022 CLINICAL DATA:  88 year old female with neurologic deficit. Stroke versus encephalopathy from CHF exacerbation. But MRI incompatible cardiac pacemaker. History of atrial fibrillation on Eliquis EXAM: CT HEAD WITHOUT CONTRAST TECHNIQUE: Contiguous axial images were obtained from the base of the skull through the vertex without intravenous contrast. RADIATION DOSE REDUCTION: This exam was performed according to the departmental dose-optimization program which includes automated exposure control, adjustment of the mA and/or kV according to patient size and/or use of iterative reconstruction technique. COMPARISON:  Head CT without contrast 05/22/2022 and earlier. FINDINGS: Brain: Fairly extensive but chronic bilateral basal ganglia vascular calcifications, present in 2014. No midline shift, mass effect, or evidence of intracranial mass lesion. No ventriculomegaly. Patchy asymmetric bilateral cerebral white matter hypodensity appears stable from 05/22/2022. Deep gray matter nuclei and brainstem appear relatively spared. No cortically based acute infarct identified. No acute intracranial hemorrhage identified. Vascular: Extensive  Calcified atherosclerosis at the skull base. No suspicious intracranial vascular hyperdensity. Skull: Hyperostosis of the calvarium. No acute osseous abnormality identified. Sinuses/Orbits: Visualized paranasal sinuses and mastoids are stable and well aerated. Other: No acute orbit or scalp soft tissue finding. IMPRESSION: Stable non contrast CT appearance of the brain. No evolving  infarct or acute intracranial hemorrhage identified. Electronically Signed   By: Genevie Ann M.D.   On: 05/24/2022 05:05   CT HEAD WO CONTRAST (5MM)  Result Date: 05/22/2022 CLINICAL DATA:  Initial evaluation for neuro deficit, stroke suspected. EXAM: CT HEAD WITHOUT CONTRAST TECHNIQUE: Contiguous axial images were obtained from the base of the skull through the vertex without intravenous contrast. RADIATION DOSE REDUCTION: This exam was performed according to the departmental dose-optimization program which includes automated exposure control, adjustment of the mA and/or kV according to patient size and/or use of iterative reconstruction technique. COMPARISON:  Prior CT from 09/19/2013. FINDINGS: Brain: Generalized age-related cerebral atrophy with mild chronic small vessel ischemic disease. No acute intracranial hemorrhage. No acute large vessel territory infarct. No mass lesion, midline shift or mass effect. No hydrocephalus or extra-axial fluid collection. Vascular: No hyperdense vessel. Calcified atherosclerosis present about the skull base. Skull: No acute scalp soft tissue abnormality. Small nodular calcification at the right parieto-occipital scalp noted, nonspecific, but likely benign. Calvarium intact. Sinuses/Orbits: Globes and orbital soft tissues demonstrate no acute finding. Paranasal sinuses mastoid air cells are largely clear. Other: 10. IMPRESSION: 1. No acute intracranial abnormality. No hemorrhage or large vessel territory infarct by CT. 2. Generalized age-related cerebral atrophy with mild chronic small vessel ischemic disease. Electronically Signed   By: Jeannine Boga M.D.   On: 05/22/2022 22:22   DG CHEST PORT 1 VIEW  Result Date: 05/23/2022 CLINICAL DATA:  Acute and chronic respiratory failure with hypoxia. EXAM: PORTABLE CHEST 1 VIEW COMPARISON:  05/20/2022. FINDINGS: 4:26 a.m. 05/23/2022. Left chest pacing system and wire insertions are unaltered. The heart moderately enlarged with  persistent mild perihilar vascular congestion and lower zonal interstitial edema with small but interval increased pleural effusions. There are stable hazy opacities in the lower lung fields consistent with ground-glass edema, pneumonitis or combination. There is increased band consolidation in the right lower lung field today which could be atelectasis or a small pneumonia. The mid and upper lungs clear with COPD change. In all other respects no further changes. Stable aortic tortuosity with ectasia. IMPRESSION: 1. Increased opacity in the right lower lung field consistent with atelectasis or focal pneumonia. 2. Basilar interstitial edema and small interval increased pleural effusions with hazy overlying opacities consistent with edema or pneumonia. 3. Cardiomegaly. Electronically Signed   By: Telford Nab M.D.   On: 05/23/2022 06:28   DG Chest Portable 1 View  Result Date: 05/20/2022 CLINICAL DATA:  Pleuritic sharp pain to back. EXAM: PORTABLE CHEST 1 VIEW pleuritic sharp stabbing pain to back. COMPARISON:  AP chest 01/13/2022 and 11/13/2020; CT chest 11/14/2020 FINDINGS: Left chest wall cardiac pacer is seen with leads overlying right atrium and right ventricle. Cardiac silhouette is again mildly to moderately enlarged. Mediastinal contours are within normal limits. There is unchanged mild bilateral interstitial thickening, greatest within the lung bases. Cystic lucencies are again seen within the upper lungs suggesting cystic emphysematous change. No definite pleural effusion. No pneumothorax. Mild levocurvature of the thoracic spine with mild multilevel degenerative disc changes. IMPRESSION: No significant change from 01/13/2022. Cardiomegaly with mild interstitial pulmonary edema. Electronically Signed   By: Yvonne Kendall M.D.   On: 05/20/2022  18:15   ECHOCARDIOGRAM COMPLETE  Result Date: 05/21/2022    ECHOCARDIOGRAM REPORT   Patient Name:   KOURTNIE SACHS Birchmeier Date of Exam: 05/21/2022 Medical Rec #:   [id number]      Height:       65.0 in Accession #:    [id number]     Weight:       113.8 lb Date of Birth:  October 09, 1933     BSA:          1.557 m Patient Age:    32 years       BP:           115/64 mmHg Patient Gender: F              HR:           72 bpm. Exam Location:  Inpatient Procedure: 2D Echo, Cardiac Doppler and Color Doppler Indications:    CHF  History:        Patient has prior history of Echocardiogram examinations, most                 recent 01/14/2022. CHF, Pacemaker; Risk Factors:Diabetes and                 Dyslipidemia.  Sonographer:    Luisa Hart RDCS Referring Phys: [id number] Shela Leff  Sonographer Comments: 07/26/2014 pacemaker IMPRESSIONS  1. Compared to echo from Jan 2023, no significant change in LVEF and RVEF.  2. Left ventricular ejection fraction, by estimation, is >75%. The left ventricle has hyperdynamic function. The left ventricle has no regional wall motion abnormalities. There is severe concentric left ventricular hypertrophy. Left ventricular diastolic parameters are indeterminate.  3. Right ventricular systolic function is moderately reduced. The right ventricular size is moderately enlarged.  4. Left atrial size was severely dilated.  5. Right atrial size was severely dilated.  6. The mitral valve is normal in structure. Mild mitral valve regurgitation.  7. The aortic valve is tricuspid. Aortic valve regurgitation is not visualized. Aortic valve sclerosis is present, with no evidence of aortic valve stenosis.  8. The inferior vena cava is dilated in size with <50% respiratory variability, suggesting right atrial pressure of 15 mmHg. FINDINGS  Left Ventricle: Left ventricular ejection fraction, by estimation, is >75%. The left ventricle has hyperdynamic function. The left ventricle has no regional wall motion abnormalities. The left ventricular internal cavity size was small. There is severe concentric left ventricular hypertrophy. Left ventricular diastolic parameters are  indeterminate. Right Ventricle: The right ventricular size is moderately enlarged. Right vetricular wall thickness was not assessed. Right ventricular systolic function is moderately reduced. Left Atrium: Left atrial size was severely dilated. Right Atrium: Right atrial size was severely dilated. Pericardium: Trivial pericardial effusion is present. Mitral Valve: The mitral valve is normal in structure. Mild mitral valve regurgitation. Tricuspid Valve: The tricuspid valve is normal in structure. Tricuspid valve regurgitation is mild. Aortic Valve: The aortic valve is tricuspid. Aortic valve regurgitation is not visualized. Aortic valve sclerosis is present, with no evidence of aortic valve stenosis. Aortic valve mean gradient measures 2.3 mmHg. Aortic valve peak gradient measures 4.4  mmHg. Aortic valve area, by VTI measures 3.46 cm. Pulmonic Valve: The pulmonic valve was normal in structure. Pulmonic valve regurgitation is not visualized. Aorta: The aortic root and ascending aorta are structurally normal, with no evidence of dilitation. Venous: The inferior vena cava is dilated in size with less than 50% respiratory variability, suggesting right atrial pressure of 15 mmHg.  IAS/Shunts: No atrial level shunt detected by color flow Doppler. Additional Comments: A device lead is visualized.  LEFT VENTRICLE PLAX 2D LVIDd:         3.20 cm     Diastology LVIDs:         1.80 cm     LV e' medial:    5.56 cm/s LV PW:         1.50 cm     LV E/e' medial:  17.2 LV IVS:        1.80 cm     LV e' lateral:   7.37 cm/s LVOT diam:     2.30 cm     LV E/e' lateral: 13.0 LV SV:         65 LV SV Index:   42 LVOT Area:     4.15 cm  LV Volumes (MOD) LV vol d, MOD A2C: 23.7 ml LV vol d, MOD A4C: 24.9 ml LV vol s, MOD A2C: 11.9 ml LV vol s, MOD A4C: 10.1 ml LV SV MOD A2C:     11.8 ml LV SV MOD A4C:     24.9 ml LV SV MOD BP:      13.6 ml RIGHT VENTRICLE RV Basal diam:  3.50 cm RV Mid diam:    1.80 cm RV S prime:     7.74 cm/s TAPSE  (M-mode): 1.3 cm LEFT ATRIUM             Index        RIGHT ATRIUM           Index LA Vol (A2C):   40.5 ml 26.02 ml/m  RA Area:     22.20 cm LA Vol (A4C):   66.2 ml 42.53 ml/m  RA Volume:   67.20 ml  43.17 ml/m LA Biplane Vol: 55.2 ml 35.46 ml/m  AORTIC VALVE                    PULMONIC VALVE AV Area (Vmax):    3.34 cm     PV Vmax:       0.74 m/s AV Area (Vmean):   3.24 cm     PV Vmean:      51.575 cm/s AV Area (VTI):     3.46 cm     PV VTI:        0.134 m AV Vmax:           104.67 cm/s  PV Peak grad:  2.2 mmHg AV Vmean:          71.233 cm/s  PV Mean grad:  1.0 mmHg AV VTI:            0.187 m AV Peak Grad:      4.4 mmHg AV Mean Grad:      2.3 mmHg LVOT Vmax:         84.20 cm/s LVOT Vmean:        55.500 cm/s LVOT VTI:          0.156 m LVOT/AV VTI ratio: 0.83  AORTA Ao Root diam: 2.90 cm Ao Asc diam:  3.50 cm MV E velocity: 95.67 cm/s  TRICUSPID VALVE                            TR Peak grad:   68.9 mmHg  TR Vmax:        415.00 cm/s                             SHUNTS                            Systemic VTI:  0.16 m                            Systemic Diam: 2.30 cm Dorris Carnes MD Electronically signed by Dorris Carnes MD Signature Date/Time: 05/21/2022/10:53:17 AM    Final    CT Angio Chest/Abd/Pel for Dissection W and/or Wo Contrast  Result Date: 05/20/2022 CLINICAL DATA:  Chest and back pain. EXAM: CT ANGIOGRAPHY CHEST, ABDOMEN AND PELVIS TECHNIQUE: Non-contrast CT of the chest was initially obtained. Multidetector CT imaging through the chest, abdomen and pelvis was performed using the standard protocol during bolus administration of intravenous contrast. Multiplanar reconstructed images and MIPs were obtained and reviewed to evaluate the vascular anatomy. RADIATION DOSE REDUCTION: This exam was performed according to the departmental dose-optimization program which includes automated exposure control, adjustment of the mA and/or kV according to patient size and/or use of iterative  reconstruction technique. CONTRAST:  60mL OMNIPAQUE IOHEXOL 350 MG/ML SOLN COMPARISON:  CT chest 11/14/2020 FINDINGS: CTA CHEST FINDINGS Cardiovascular: Heart is moderately enlarged, unchanged. There is a new small pericardial effusion. There is adequate opacification of the thoracic aorta. There is no evidence for aortic dissection or aneurysm. There is calcified atherosclerotic disease throughout the aorta. Left-sided pacemaker is present with leads in the right atrium and right ventricle. Mediastinum/Nodes: No enlarged mediastinal, hilar, or axillary lymph nodes. Thyroid gland, trachea, and esophagus demonstrate no significant findings. Lungs/Pleura: There are trace bilateral pleural effusions scratch at trace right and small left pleural effusions are present. There is a small amount of atelectasis in the bilateral lower lobes, right middle lobe and lingula. There is a calcified granuloma in the right lung apex, unchanged. Pneumothorax. Musculoskeletal: No acute fracture. Review of the MIP images confirms the above findings. CTA ABDOMEN AND PELVIS FINDINGS VASCULAR Aorta: Normal caliber aorta without aneurysm, dissection, vasculitis or significant stenosis. There are atherosclerotic calcifications of the aorta. Celiac: Patent without evidence of aneurysm, dissection, vasculitis or significant stenosis. SMA: Patent without evidence of aneurysm, dissection, vasculitis or significant stenosis. Renals: There is moderate stenosis at the origin of the right renal artery. Left renal artery is within normal limits. IMA: Patent without evidence of aneurysm, dissection, vasculitis or significant stenosis. Inflow: Patent without evidence of aneurysm, dissection, vasculitis or significant stenosis. Veins: No obvious venous abnormality within the limitations of this arterial phase study. Review of the MIP images confirms the above findings. NON-VASCULAR Hepatobiliary: No focal liver abnormality is seen. Status post  cholecystectomy. No biliary dilatation. Pancreas: Unremarkable. No pancreatic ductal dilatation or surrounding inflammatory changes. Spleen: Normal in size without focal abnormality. Adrenals/Urinary Tract: Renal cortical hypodensities are seen in both kidneys which are too small to characterize, likely cysts. There is no hydronephrosis or perinephric fluid. The adrenal glands and bladder are within normal limits. Stomach/Bowel: Stomach is within normal limits. No evidence of bowel wall thickening, distention, or inflammatory changes. Appendix is not seen. There is sigmoid colon diverticulosis without evidence for acute diverticulitis. Lymphatic: No enlarged lymph nodes are seen. Reproductive: Uterus and bilateral adnexa are unremarkable. Other: No abdominal wall hernia  or abnormality. Small amount of free fluid in the pelvis. Musculoskeletal: There is scoliosis and degenerative change throughout the lumbar spine. Review of the MIP images confirms the above findings. IMPRESSION: 1. No evidence for aortic dissection or aneurysm. 2. Moderate cardiomegaly with small pericardial effusion. 3. Small bilateral pleural effusions. 4. Small amount of free fluid in the pelvis of uncertain etiology. 5. Sigmoid colon diverticulosis. 6.  Aortic Atherosclerosis (ICD10-I70.0). Electronically Signed   By: Ronney Asters M.D.   On: 05/20/2022 19:37   VAS US CAROTID  Result Date: 05/23/2022 Carotid Arterial Duplex Study Patient Name:  RIELY OETKEN Doscher  Date of Exam:   05/23/2022 Medical Rec #: [id number]       Accession #:    [id number] Date of Birth: [birth year]      Patient Gender: F Patient Age:   88 years Exam Location:  [facility] Procedure:      VAS US CAROTID Referring Phys: Cornelius Moras Lubertha Leite --------------------------------------------------------------------------------  Indications:       Bilateral bruits. Risk Factors:      Hypertension, hyperlipidemia, Diabetes. Other Factors:     Congenital deafness, CKD IIIb, atrial  fibrillation,                    Pcemaker. Comparison Study:  Prior normal carotid duplex on file from 10/13/2009 Performing Technologist: Sharion Dove RVS  Examination Guidelines: A complete evaluation includes B-mode imaging, spectral Doppler, color Doppler, and power Doppler as needed of all accessible portions of each vessel. Bilateral testing is considered an integral part of a complete examination. Limited examinations for reoccurring indications may be performed as noted.  Right Carotid Findings: +----------+--------+--------+--------+------------------+------------------+           PSV cm/sEDV cm/sStenosisPlaque DescriptionComments           +----------+--------+--------+--------+------------------+------------------+ CCA Prox  109     14                                intimal thickening +----------+--------+--------+--------+------------------+------------------+ CCA Distal72      11                                intimal thickening +----------+--------+--------+--------+------------------+------------------+ ICA Prox  66      16              calcific                             +----------+--------+--------+--------+------------------+------------------+ ICA Distal76      14                                                   +----------+--------+--------+--------+------------------+------------------+ ECA       110     7                                                    +----------+--------+--------+--------+------------------+------------------+ +----------+--------+-------+--------+-------------------+           PSV cm/sEDV cmsDescribeArm Pressure (mmHG) +----------+--------+-------+--------+-------------------+ Subclavian105                                        +----------+--------+-------+--------+-------------------+ +---------+--------+--+--------+--+  VertebralPSV cm/s67EDV cm/s16 +---------+--------+--+--------+--+  Left Carotid  Findings: +----------+--------+--------+--------+------------------+------------------+           PSV cm/sEDV cm/sStenosisPlaque DescriptionComments           +----------+--------+--------+--------+------------------+------------------+ CCA Prox  71      12                                intimal thickening +----------+--------+--------+--------+------------------+------------------+ CCA Distal77      14                                intimal thickening +----------+--------+--------+--------+------------------+------------------+ ICA Prox  45      10              calcific                             +----------+--------+--------+--------+------------------+------------------+ ICA Distal41      7                                                    +----------+--------+--------+--------+------------------+------------------+ ECA       84      2                                                    +----------+--------+--------+--------+------------------+------------------+ +----------+--------+--------+--------+-------------------+           PSV cm/sEDV cm/sDescribeArm Pressure (mmHG) +----------+--------+--------+--------+-------------------+ [id number]                                         +----------+--------+--------+--------+-------------------+ +---------+--------+--+--------+--+ VertebralPSV cm/s67EDV cm/s14 +---------+--------+--+--------+--+   Summary: Right Carotid: Velocities in the right ICA are consistent with a 1-39% stenosis. Left Carotid: Velocities in the left ICA are consistent with a 1-39% stenosis. Vertebrals:  Bilateral vertebral arteries demonstrate antegrade flow. Subclavians: Normal flow hemodynamics were seen in bilateral subclavian              arteries. *See table(s) above for measurements and observations.  Electronically signed by Orlie Pollen on 05/23/2022 at 5:22:52 PM.    Final      PHYSICAL EXAM  Temp:  [97.3 F (36.3  C)-99 F (37.2 C)] 97.3 F (36.3 C) (06/02 1216) Pulse Rate:  [69-88] 74 (06/02 1216) Resp:  [16-22] 16 (06/02 1216) BP: (111-133)/(65-76) 125/65 (06/02 1216) SpO2:  [91 %-100 %] 96 % (06/02 1216) Weight:  [52.5 kg] 52.5 kg (06/02 0314)  General - thin built, well developed, in sleep but easily arousable.  Ophthalmologic - fundi not visualized due to noncooperation.  Cardiovascular - Regular rhythm and rate.  Neuro - was in sleep initially but easily arousable with voice and tactile stimulation, mildly lethargic (daughter said she just woke from sleep). Heard of hearing, able to speak a few words with dysarthric voice. orientated to age, place, time and people. Following all simple commands. Left eye mild ptosis with corneal clouding and blind, right eye no gaze palsy, tracking bilaterally, visual field full. Chronic  right facial droop. Tongue midline. Bilateral UEs 4/5, no drift. Bilaterally LEs 3/5, no drift. Sensation symmetrical bilaterally, b/l FTN intact, gait not tested.    ASSESSMENT/PLAN Ms. Veronica SAUTTER is a 88 y.o. female with history of A-fib on Eliquis 2.5 mg twice daily, CAD, hypertension, hyperlipidemia, diabetes, left eye blind, CHF, pacemaker, AKI on CKD admitted for chest pain with CHF exacerbation and respiratory distress.  Patient found to have somnolence with a fall and questionable right-sided weakness and aphasia.  Neurology consulted for evaluation.  Likely encephalopathy from CHF exacerbation, stroke less likely CT no acute abnormality MRI not able to perform given incompatible pacemaker Would not recommend CTA head and neck given AKI on CKD Intracranial imaging not needed at this time given not changing management CT repeat 6/2 no acute finding Carotid Doppler unremarkable 2D Echo  > 75% LDL 136 HgbA1c 6.1 Eliquis for VTE prophylaxis Eliquis (apixaban) daily prior to admission, now on Eliquis 2.5 mg twice daily. Patient counseled to be compliant with her  antithrombotic medications Ongoing aggressive stroke risk factor management Therapy recommendations: Home health PT Disposition: Pending  CHF exacerbation PAF Treated with IV Lasix Cardiology on board On Eliquis 2.5 PTA Continue Eliquis  Diabetes HgbA1c 6.1 goal < 7.0 Controlled CBG monitoring SSI DM education and close PCP follow up  Hypertension Stable on the low end Long term BP goal normotensive  Hyperlipidemia Home meds: None LDL 136, goal < 70 Now on Crestor 10 Continue statin at discharge  Other Stroke Risk Factors Advanced age Coronary artery disease  Other Active Problems Pacemaker in place Left eye blind AKI on CKD 3B, creatinine 1.53-2.01-1.98-1.70  Hospital day # 4  Neurology will sign off. Please call with questions. No stroke follow up needed at this time. Thanks for the consult.  Rosalin Hawking, MD PhD Stroke Neurology 05/24/2022 1:01 PM    To contact Stroke Continuity provider, please refer to [email or web address] After hours, contact General Neurology

## 2022-05-24 NOTE — Plan of Care (Signed)
  Problem: Cardiac: Goal: Ability to achieve and maintain adequate cardiopulmonary perfusion will improve Outcome: Progressing   

## 2022-05-24 NOTE — Progress Notes (Signed)
Pt returned from CT O2 sat mid 80's. Pt not in distress or complaining of SOB oxygen at 7L HFNC increased to 11 L pt is currently 92% will continue to monitor pt.

## 2022-05-24 NOTE — Progress Notes (Signed)
Occupational Therapy Treatment Patient Details Name: Veronica Young MRN: [id number] DOB: 12-13-1933 Today's Date: 05/24/2022   History of present illness 88 y.o. female admitted 5/29 with chest pain and increased oxygen requirement with CHF exacerbation. 5/31 Fall after sedating medication with possible rt weakness and head CT (-). 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 Patient was able to get to EOB with min guard assist using rails. Patient was HHA to ambulate to sink for grooming tasks and performed without seated break before going to recliner. Patient's O2 was 93 following therapy while up in recliner. Acute OT to continue to follow.    Recommendations for follow up therapy are one component of a multi-disciplinary discharge planning process, led by the attending physician.  Recommendations may be updated based on patient status, additional functional criteria and insurance authorization.    Follow Up Recommendations  Other (comment) (OT can defer to [facility] PT if they believe she needs HHOT)    Assistance Recommended at Discharge Intermittent Supervision/Assistance  Patient can return home with the following  A little help with bathing/dressing/bathroom;Assist for transportation   Equipment Recommendations  None recommended by OT    Recommendations for Other Services      Precautions / Restrictions Precautions Precautions: Fall Restrictions Weight Bearing Restrictions: No       Mobility Bed Mobility Overal bed mobility: Needs Assistance Bed Mobility: Supine to Sit     Supine to sit: Min guard     General bed mobility comments: min guard with use of rail    Transfers Overall transfer level: Needs assistance Equipment used: 1 person hand held assist Transfers: Sit  to/from Stand, Bed to chair/wheelchair/BSC Sit to Stand: Min assist           General transfer comment: min assist due to unsteady balance     Balance Overall balance assessment: Needs assistance Sitting-balance support: Feet supported, No upper extremity supported Sitting balance-Leahy Scale: Fair     Standing balance support: No upper extremity supported, During functional activity Standing balance-Leahy Scale: Fair Standing balance comment: stood at sink for grooming tasks                           ADL either performed or assessed with clinical judgement   ADL Overall ADL's : Needs assistance/impaired     Grooming: Wash/dry hands;Wash/dry face;Oral care;Brushing hair;Min guard;Standing Grooming Details (indicate cue type and reason): standing at sink                               General ADL Comments: unsteady    Extremity/Trunk Assessment              Vision       Perception     Praxis      Cognition Arousal/Alertness: Awake/alert Behavior During Therapy: WFL for tasks assessed/performed Overall Cognitive Status: Within Functional Limits for tasks assessed Area of Impairment: Safety/judgement                         Safety/Judgement: Decreased awareness of deficits              Exercises  Shoulder Instructions       General Comments      Pertinent Vitals/ Pain       Pain Assessment Pain Assessment: No/denies pain  Home Living                                          Prior Functioning/Environment              Frequency  Min 2X/week        Progress Toward Goals  OT Goals(current goals can now be found in the care plan section)  Progress towards OT goals: Progressing toward goals  Acute Rehab OT Goals Patient Stated Goal: get better OT Goal Formulation: With patient Time For Goal Achievement: 2022/07/04 Potential to Achieve Goals: Good ADL Goals Pt Will Perform  Grooming: with modified independence;standing Pt Will Perform Upper Body Dressing: with modified independence;sitting;standing Pt Will Perform Lower Body Dressing: with modified independence;sit to/from stand Pt Will Transfer to Toilet: with modified independence;ambulating;regular height toilet  Plan Discharge plan remains appropriate    Co-evaluation                 AM-PAC OT "6 Clicks" Daily Activity     Outcome Measure   Help from another person eating meals?: None Help from another person taking care of personal grooming?: A Little Help from another person toileting, which includes using toliet, bedpan, or urinal?: A Little Help from another person bathing (including washing, rinsing, drying)?: A Little Help from another person to put on and taking off regular upper body clothing?: A Little Help from another person to put on and taking off regular lower body clothing?: A Little 6 Click Score: 19    End of Session Equipment Utilized During Treatment: Gait belt;Oxygen  OT Visit Diagnosis: Unsteadiness on feet (R26.81)   Activity Tolerance Patient tolerated treatment well   Patient Left in chair;with call bell/phone within reach;with family/visitor present   Nurse Communication Mobility status        Time: 0109-3235 OT Time Calculation (min): 24 min  Charges: OT General Charges $OT Visit: 1 Visit OT Treatments $Self Care/Home Management : 23-37 mins  Lodema Hong, [place]  Pager [phone] Office [place] 05/24/2022, 3:15 PM

## 2022-05-24 NOTE — Progress Notes (Addendum)
Progress Note  Patient Name: Veronica Young Date of Encounter: 05/24/2022  [facility] Cardiologist: Peter [name], MD   Subjective   Increased O2 requirement overnight. Now on HFNC. Daughter at the bedside. Sign language interpreter used.  Inpatient Medications    Scheduled Meds:  apixaban  2.5 mg Oral BID   atropine  1 drop Left Eye QHS   brimonidine  1 drop Both Eyes BID   escitalopram  10 mg Oral Daily   feeding supplement  237 mL Oral TID BM   fluorometholone  1 drop Left Eye QID   gabapentin  100 mg Oral QHS   insulin aspart  0-5 Units Subcutaneous QHS   insulin aspart  0-9 Units Subcutaneous TID WC   latanoprost  1 drop Both Eyes BID   levothyroxine  100 mcg Oral Q0600   midodrine  10 mg Oral BID WC   Netarsudil Dimesylate  1 drop Left Eye QHS   rosuvastatin  10 mg Oral Daily   Continuous Infusions:  PRN Meds: acetaminophen **OR** acetaminophen   Vital Signs    Vitals:   05/24/22 0515 05/24/22 0546 05/24/22 0547 05/24/22 0742  BP:    133/76  Pulse:  78 73 88  Resp:  (!) 22 20 20   Temp:    97.8 F (36.6 C)  TempSrc:    Oral  SpO2: 92% 92% 91% 92%  Weight:      Height:        Intake/Output Summary (Last 24 hours) at 05/24/2022 1048 Last data filed at 05/24/2022 0743 Gross per 24 hour  Intake 237 ml  Output 1050 ml  Net -813 ml      05/24/2022    3:14 AM 05/23/2022    4:02 AM 05/22/2022    4:32 AM  Last 3 Weights  Weight (lbs) 115 lb 11.9 oz 116 lb 13.5 oz 115 lb 1.6 oz  Weight (kg) 52.5 kg 53 kg 52.209 kg      Telemetry    Afib rates 80-90 - Personally Reviewed  ECG    No new tracing this morning  Physical Exam   GEN: Frail older female, sitting up in bed, HFNC @11L  Neck: No JVD Cardiac: Irreg Irreg, no murmurs, rubs, or gallops.  Respiratory: Clear to auscultation bilaterally. GI: Soft, nontender, non-distended  MS: No edema; No deformity. Neuro:  Nonfocal  Psych: Normal affect   Labs    High Sensitivity Troponin:   Recent Labs   Lab 05/20/22 1742 05/20/22 1942  TROPONINIHS 15 16     Chemistry Recent Labs  Lab 05/20/22 1742 05/21/22 0647 05/22/22 0249 05/23/22 0204  NA 135 135 131* 132*  K 3.9 3.8 4.3 3.9  CL 93* 95* 97* 96*  CO2 31 31 27 28   GLUCOSE 122* 110* 152* 106*  BUN 38* 40* 53* 53*  CREATININE 1.53* 1.76* 2.01* 1.98*  CALCIUM 9.0 9.0 8.7* 8.6*  MG 2.1  --   --  2.4  PROT 7.4 6.7 6.3*  --   ALBUMIN 3.6 3.1* 2.9*  --   AST 43* 38 32  --   ALT 23 22 21   --   ALKPHOS 163* 149* 142*  --   BILITOT 2.0* 1.9* 1.4*  --   GFRNONAA 33* 28* 24* 24*  ANIONGAP 11 9 7 8     Lipids  Recent Labs  Lab 05/23/22 0204  CHOL 182  TRIG 85  HDL 29*  LDLCALC 136*  CHOLHDL 6.3    Hematology Recent Labs  Lab 05/20/22 1742 05/21/22 0647 05/22/22 0249  WBC 8.9 9.1 10.1  RBC 4.47 4.26 3.93  HGB 12.7 12.1 11.1*  HCT 39.5 37.2 34.7*  MCV 88.4 87.3 88.3  MCH 28.4 28.4 28.2  MCHC 32.2 32.5 32.0  RDW 18.3* 18.3* 18.4*  PLT 111* 102* 115*   Thyroid  Recent Labs  Lab 05/20/22 1742 05/23/22 0204  TSH 0.296*  --   FREET4  --  1.19*    BNP Recent Labs  Lab 05/20/22 1742  BNP 690.5*    DDimer No results for input(s): DDIMER in the last 168 hours.   Radiology    CT HEAD WO CONTRAST (5MM)  Result Date: 05/24/2022 CLINICAL DATA:  88 year old female with neurologic deficit. Stroke versus encephalopathy from CHF exacerbation. But MRI incompatible cardiac pacemaker. History of atrial fibrillation on Eliquis EXAM: CT HEAD WITHOUT CONTRAST TECHNIQUE: Contiguous axial images were obtained from the base of the skull through the vertex without intravenous contrast. RADIATION DOSE REDUCTION: This exam was performed according to the departmental dose-optimization program which includes automated exposure control, adjustment of the mA and/or kV according to patient size and/or use of iterative reconstruction technique. COMPARISON:  Head CT without contrast 05/22/2022 and earlier. FINDINGS: Brain: Fairly  extensive but chronic bilateral basal ganglia vascular calcifications, present in 2014. No midline shift, mass effect, or evidence of intracranial mass lesion. No ventriculomegaly. Patchy asymmetric bilateral cerebral white matter hypodensity appears stable from 05/22/2022. Deep gray matter nuclei and brainstem appear relatively spared. No cortically based acute infarct identified. No acute intracranial hemorrhage identified. Vascular: Extensive Calcified atherosclerosis at the skull base. No suspicious intracranial vascular hyperdensity. Skull: Hyperostosis of the calvarium. No acute osseous abnormality identified. Sinuses/Orbits: Visualized paranasal sinuses and mastoids are stable and well aerated. Other: No acute orbit or scalp soft tissue finding. IMPRESSION: Stable non contrast CT appearance of the brain. No evolving infarct or acute intracranial hemorrhage identified. Electronically Signed   By: Genevie Ann M.D.   On: 05/24/2022 05:05   CT HEAD WO CONTRAST (5MM)  Result Date: 05/22/2022 CLINICAL DATA:  Initial evaluation for neuro deficit, stroke suspected. EXAM: CT HEAD WITHOUT CONTRAST TECHNIQUE: Contiguous axial images were obtained from the base of the skull through the vertex without intravenous contrast. RADIATION DOSE REDUCTION: This exam was performed according to the departmental dose-optimization program which includes automated exposure control, adjustment of the mA and/or kV according to patient size and/or use of iterative reconstruction technique. COMPARISON:  Prior CT from 09/19/2013. FINDINGS: Brain: Generalized age-related cerebral atrophy with mild chronic small vessel ischemic disease. No acute intracranial hemorrhage. No acute large vessel territory infarct. No mass lesion, midline shift or mass effect. No hydrocephalus or extra-axial fluid collection. Vascular: No hyperdense vessel. Calcified atherosclerosis present about the skull base. Skull: No acute scalp soft tissue abnormality.  Small nodular calcification at the right parieto-occipital scalp noted, nonspecific, but likely benign. Calvarium intact. Sinuses/Orbits: Globes and orbital soft tissues demonstrate no acute finding. Paranasal sinuses mastoid air cells are largely clear. Other: 10. IMPRESSION: 1. No acute intracranial abnormality. No hemorrhage or large vessel territory infarct by CT. 2. Generalized age-related cerebral atrophy with mild chronic small vessel ischemic disease. Electronically Signed   By: Jeannine Boga M.D.   On: 05/22/2022 22:22   DG CHEST PORT 1 VIEW  Result Date: 05/23/2022 CLINICAL DATA:  Acute and chronic respiratory failure with hypoxia. EXAM: PORTABLE CHEST 1 VIEW COMPARISON:  05/20/2022. FINDINGS: 4:26 a.m. 05/23/2022. Left chest pacing system and wire insertions are unaltered.  The heart moderately enlarged with persistent mild perihilar vascular congestion and lower zonal interstitial edema with small but interval increased pleural effusions. There are stable hazy opacities in the lower lung fields consistent with ground-glass edema, pneumonitis or combination. There is increased band consolidation in the right lower lung field today which could be atelectasis or a small pneumonia. The mid and upper lungs clear with COPD change. In all other respects no further changes. Stable aortic tortuosity with ectasia. IMPRESSION: 1. Increased opacity in the right lower lung field consistent with atelectasis or focal pneumonia. 2. Basilar interstitial edema and small interval increased pleural effusions with hazy overlying opacities consistent with edema or pneumonia. 3. Cardiomegaly. Electronically Signed   By: Telford Nab M.D.   On: 05/23/2022 06:28   VAS US CAROTID  Result Date: 05/23/2022 Carotid Arterial Duplex Study Patient Name:  Veronica Young [place]  Date of Exam:   05/23/2022 Medical Rec #: [id number]       Accession #:    [id number] Date of Birth: 1933-07-12      Patient Gender: F Patient Age:   33  years Exam Location:  [facility] Procedure:      VAS US CAROTID Referring Phys: Cornelius Moras XU --------------------------------------------------------------------------------  Indications:       Bilateral bruits. Risk Factors:      Hypertension, hyperlipidemia, Diabetes. Other Factors:     Congenital deafness, CKD IIIb, atrial fibrillation,                    Pcemaker. Comparison Study:  Prior normal carotid duplex on file from 10/13/2009 Performing Technologist: Sharion Dove RVS  Examination Guidelines: A complete evaluation includes B-mode imaging, spectral Doppler, color Doppler, and power Doppler as needed of all accessible portions of each vessel. Bilateral testing is considered an integral part of a complete examination. Limited examinations for reoccurring indications may be performed as noted.  Right Carotid Findings: +----------+--------+--------+--------+------------------+------------------+           PSV cm/sEDV cm/sStenosisPlaque DescriptionComments           +----------+--------+--------+--------+------------------+------------------+ CCA Prox  109     14                                intimal thickening +----------+--------+--------+--------+------------------+------------------+ CCA Distal72      11                                intimal thickening +----------+--------+--------+--------+------------------+------------------+ ICA Prox  66      16              calcific                             +----------+--------+--------+--------+------------------+------------------+ ICA Distal76      14                                                   +----------+--------+--------+--------+------------------+------------------+ ECA       110     7                                                    +----------+--------+--------+--------+------------------+------------------+ +----------+--------+-------+--------+-------------------+  PSV cm/sEDV  cmsDescribeArm Pressure (mmHG) +----------+--------+-------+--------+-------------------+ Subclavian105                                        +----------+--------+-------+--------+-------------------+ +---------+--------+--+--------+--+ VertebralPSV cm/s67EDV cm/s16 +---------+--------+--+--------+--+  Left Carotid Findings: +----------+--------+--------+--------+------------------+------------------+           PSV cm/sEDV cm/sStenosisPlaque DescriptionComments           +----------+--------+--------+--------+------------------+------------------+ CCA Prox  71      12                                intimal thickening +----------+--------+--------+--------+------------------+------------------+ CCA Distal77      14                                intimal thickening +----------+--------+--------+--------+------------------+------------------+ ICA Prox  45      10              calcific                             +----------+--------+--------+--------+------------------+------------------+ ICA Distal41      7                                                    +----------+--------+--------+--------+------------------+------------------+ ECA       84      2                                                    +----------+--------+--------+--------+------------------+------------------+ +----------+--------+--------+--------+-------------------+           PSV cm/sEDV cm/sDescribeArm Pressure (mmHG) +----------+--------+--------+--------+-------------------+ [id number]                                         +----------+--------+--------+--------+-------------------+ +---------+--------+--+--------+--+ VertebralPSV cm/s67EDV cm/s14 +---------+--------+--+--------+--+   Summary: Right Carotid: Velocities in the right ICA are consistent with a 1-39% stenosis. Left Carotid: Velocities in the left ICA are consistent with a 1-39% stenosis. Vertebrals:   Bilateral vertebral arteries demonstrate antegrade flow. Subclavians: Normal flow hemodynamics were seen in bilateral subclavian              arteries. *See table(s) above for measurements and observations.  Electronically signed by Orlie Pollen on 05/23/2022 at 5:22:52 PM.    Final     Cardiac Studies   Echo: 05/21/22   IMPRESSIONS     1. Compared to echo from Jan 2023, no significant change in LVEF and  RVEF.   2. Left ventricular ejection fraction, by estimation, is >75%. The left  ventricle has hyperdynamic function. The left ventricle has no regional  wall motion abnormalities. There is severe concentric left ventricular  hypertrophy. Left ventricular  diastolic parameters are indeterminate.   3. Right ventricular systolic function is moderately reduced. The right  ventricular size is moderately enlarged.   4. Left atrial size was severely dilated.   5.  Right atrial size was severely dilated.   6. The mitral valve is normal in structure. Mild mitral valve  regurgitation.   7. The aortic valve is tricuspid. Aortic valve regurgitation is not  visualized. Aortic valve sclerosis is present, with no evidence of aortic  valve stenosis.   8. The inferior vena cava is dilated in size with <50% respiratory  variability, suggesting right atrial pressure of 15 mmHg.   FINDINGS   Left Ventricle: Left ventricular ejection fraction, by estimation, is  >75%. The left ventricle has hyperdynamic function. The left ventricle has  no regional wall motion abnormalities. The left ventricular internal  cavity size was small. There is severe  concentric left ventricular hypertrophy. Left ventricular diastolic  parameters are indeterminate.   Right Ventricle: The right ventricular size is moderately enlarged. Right  vetricular wall thickness was not assessed. Right ventricular systolic  function is moderately reduced.   Left Atrium: Left atrial size was severely dilated.   Right Atrium: Right  atrial size was severely dilated.   Pericardium: Trivial pericardial effusion is present.   Mitral Valve: The mitral valve is normal in structure. Mild mitral valve  regurgitation.   Tricuspid Valve: The tricuspid valve is normal in structure. Tricuspid  valve regurgitation is mild.   Aortic Valve: The aortic valve is tricuspid. Aortic valve regurgitation is  not visualized. Aortic valve sclerosis is present, with no evidence of  aortic valve stenosis. Aortic valve mean gradient measures 2.3 mmHg.  Aortic valve peak gradient measures 4.4   mmHg. Aortic valve area, by VTI measures 3.46 cm.   Pulmonic Valve: The pulmonic valve was normal in structure. Pulmonic valve  regurgitation is not visualized.   Aorta: The aortic root and ascending aorta are structurally normal, with  no evidence of dilitation.   Venous: The inferior vena cava is dilated in size with less than 50%  respiratory variability, suggesting right atrial pressure of 15 mmHg.   IAS/Shunts: No atrial level shunt detected by color flow Doppler.   Additional Comments: A device lead is visualized.     Patient Profile     88 y.o. female with a hx of congenital deafness, chronic diastolic CHF, persistent atrial fibrillation on eliquis, paroxysmal atrial tachycardia, sick sinus syndrome s/p [employer] PPM, CAD, PAD, HTN, HLD, type 2 DM, hypothyroidism CKD stage IIIb who was seen 05/21/2022 for the evaluation of CHF, chest pain, atrial fibrillation at the request of Dr. Broadus John.  Assessment & Plan    Acute on Chronic hypoxic respiratory failure/Acute on Chronic diastolic heart failure: BNP was elevated to 690.5, CXR with cardiomegaly and mild intersitial pulmonary edema/CT chest with small bilateral effusions. Echo showed LVEF of >75%, LVH, moderately reduced RV, severe biatrial enlargement. -- treated with IV lasix, held with rise in Cr.  -- CXR 6/1 suggestive of RLL atelectasis vs focal PNA, small pleural effusions. Would  recommend non-contrast CT chest to further assess -- I&Os not accurate -- will hold additional lasix for now    Small Pericardial Effusion: Noted on CTA chest/abd/pelvis  -- trivial pericardial effusion on echocardiogram, suspect chronic   CKD stage III: Cr baseline around 1.5-1.7, up to 2.01>>1.98  -- follow BMET   Chest Pain: reported having chest tightness when she would deep breathe. Denied any positional chest pain.  -- hsTn negative   Persistent A-Fib: Continue eliquis 2.5 mg BID (dose adjusted due to age, weight). Severe biatrial enlargement on echo -- BB was held on admission 2/2 soft blood pressures, remains rate  control -- on midodrine 10mg  BID   Fall/right sided weakness: had unwitnessed fall 5/31. CT head negative for bleed.  -- suspect symptoms related to receiving Xanax, resolved  -- seen by neuro, unable to complete MRI 2/2 PPM   SSS s/p PPM: followed by Dr. Lovena Le   Otherwise per primary  - Type 2 DM  - Hypothyroidism  - Mild thrombocytopenia  - PAD    For questions or updates, please contact [facility] Please consult [email or web address] for contact info under        Signed, Reino Bellis, NP  05/24/2022, 10:48 AM     ATTENDING ATTESTATION:  After conducting a review of all available clinical information with the care team, interviewing the patient, and performing a physical exam, I agree with the findings and plan described in this note.   GEN: No acute distress.   HEENT:  Dry MM, +JVD, no scleral icterus Cardiac: Irregular, no murmurs, rubs, or gallops.  Respiratory: Very poor aeration. GI: Soft, nontender, non-distended  MS: No edema; No deformity. Neuro:  Nonfocal  Vasc:  +2 radial pulses  The patient unfortunately continues to be quite short of breath and requires high flow nasal cannula.  Her exam is difficult.  She looks dry for her mucous membranes but her JVD does not seem elevated to me today.  She is net -1.5 L with an increase in her  creatinine in response to diuretics.  Given her increased oxygen requirement despite being net -1.5 L I think further imaging and data would be important.  I have discussed obtaining a noncontrast CT scan of her lungs to evaluate further.  We will check a procalcitonin level as well.  I did broach the issue of perhaps a right heart catheterization for definitive assessment but her family are understandably reluctant to undergo on any invasive procedures given her overall clinical course and frailty.    Lenna Sciara, MD Pager [phone]   Addendum: I reviewed the patient's CT scan which demonstrated groundglass densities in the perihilar regions suggestive of pulmonary edema as well as moderate large pleural effusions and increasing pericardial effusion.  There are new patchy infiltrates also seen.  Her procalcitonin level is elevated.  We will obtain an repeat limited echocardiogram to evaluate the effusion.  I am concerned about an infectious process.  Her creatinine has improved somewhat.  I will give her another dose of Lasix 40 mg IV x1 to see if this helps her oxygen requirement.  However I am more concerned about an infectious process given the appearance of new infiltrates.

## 2022-05-25 ENCOUNTER — Other Ambulatory Visit: Payer: Self-pay | Admitting: Physician Assistant

## 2022-05-25 ENCOUNTER — Inpatient Hospital Stay (HOSPITAL_COMMUNITY): Payer: Medicare HMO

## 2022-05-25 DIAGNOSIS — Z515 Encounter for palliative care: Secondary | ICD-10-CM

## 2022-05-25 DIAGNOSIS — R079 Chest pain, unspecified: Secondary | ICD-10-CM

## 2022-05-25 DIAGNOSIS — J9621 Acute and chronic respiratory failure with hypoxia: Secondary | ICD-10-CM

## 2022-05-25 DIAGNOSIS — I3139 Other pericardial effusion (noninflammatory): Secondary | ICD-10-CM

## 2022-05-25 DIAGNOSIS — H905 Unspecified sensorineural hearing loss: Secondary | ICD-10-CM | POA: Diagnosis not present

## 2022-05-25 DIAGNOSIS — I5033 Acute on chronic diastolic (congestive) heart failure: Secondary | ICD-10-CM | POA: Diagnosis not present

## 2022-05-25 LAB — BASIC METABOLIC PANEL
Anion gap: 8 (ref 5–15)
BUN: 33 mg/dL — ABNORMAL HIGH (ref 8–23)
CO2: 29 mmol/L (ref 22–32)
Calcium: 8.1 mg/dL — ABNORMAL LOW (ref 8.9–10.3)
Chloride: 91 mmol/L — ABNORMAL LOW (ref 98–111)
Creatinine, Ser: 1.24 mg/dL — ABNORMAL HIGH (ref 0.44–1.00)
GFR, Estimated: 42 mL/min — ABNORMAL LOW (ref >60)
Glucose, Bld: 88 mg/dL (ref 70–99)
Potassium: 3.6 mmol/L (ref 3.5–5.1)
Sodium: 128 mmol/L — ABNORMAL LOW (ref 135–145)

## 2022-05-25 LAB — ECHOCARDIOGRAM LIMITED
AV Peak grad: 83.2 mmHg
Ao pk vel: 4.56 m/s
Height: 65 in
S' Lateral: 2.9 cm
Weight: 1851.86 oz

## 2022-05-25 LAB — GLUCOSE, CAPILLARY
Glucose-Capillary: 155 mg/dL — ABNORMAL HIGH (ref 70–99)
Glucose-Capillary: 126 mg/dL — ABNORMAL HIGH (ref 70–99)

## 2022-05-25 LAB — CBC WITH DIFFERENTIAL/PLATELET
Abs Immature Granulocytes: 0.02 10*3/uL (ref 0.00–0.07)
Basophils Absolute: 0 10*3/uL (ref 0.0–0.1)
Basophils Relative: 1 %
Eosinophils Absolute: 0.3 10*3/uL (ref 0.0–0.5)
Eosinophils Relative: 5 %
HCT: 33.7 % — ABNORMAL LOW (ref 36.0–46.0)
Hemoglobin: 10.8 g/dL — ABNORMAL LOW (ref 12.0–15.0)
Immature Granulocytes: 0 %
Lymphocytes Relative: 17 %
Lymphs Abs: 1 10*3/uL (ref 0.7–4.0)
MCH: 28.2 pg (ref 26.0–34.0)
MCHC: 32 g/dL (ref 30.0–36.0)
MCV: 88 fL (ref 80.0–100.0)
Monocytes Absolute: 0.7 10*3/uL (ref 0.1–1.0)
Monocytes Relative: 13 %
Neutro Abs: 3.7 10*3/uL (ref 1.7–7.7)
Neutrophils Relative %: 64 %
Platelets: 134 10*3/uL — ABNORMAL LOW (ref 150–400)
RBC: 3.83 MIL/uL — ABNORMAL LOW (ref 3.87–5.11)
RDW: 17.7 % — ABNORMAL HIGH (ref 11.5–15.5)
WBC: 5.8 10*3/uL (ref 4.0–10.5)
nRBC: 0 % (ref 0.0–0.2)

## 2022-05-25 MED ORDER — FUROSEMIDE 10 MG/ML IJ SOLN
40.0000 mg | Freq: Two times a day (BID) | INTRAMUSCULAR | Status: DC
  Administered 2022-05-25 – 2022-05-27 (×5): 40 mg via INTRAVENOUS
  Filled 2022-05-25 (×5): qty 4

## 2022-05-25 NOTE — Progress Notes (Signed)
  Echocardiogram 2D Echocardiogram has been performed.  Bobbye Charleston 05/25/2022, 10:21 AM

## 2022-05-25 NOTE — Progress Notes (Addendum)
Progress Note   Patient: Veronica Young OEV:[id number] DOB: 1933-01-28 DOA: 05/20/2022     5 DOS: the patient was seen and examined on 05/25/2022   Brief hospital course: Veronica Young was admitted to the hospital with the working diagnosis of chest pain, complicated with congested heart failure, in the setting of pulmonary hypertension. Acute on chronic hypoxemic respiratory failure.   88 yo female with congential deafness, CKD stage 3b, diastolic heart failure, atrial fibrillation, sick sinus syndrome sp PPM, coronary artery disease, hypertension, dyslipidemia and T2DM who presented with chest pain. Reported constant chest pain, upper back and upper chest, not exertion related and worse with vomiting. In the Ed her 02 saturation was 84% on 2 L/min per [place], her blood pressure 104/55, HR 80, RR 26, lungs with no wheezing, heart with S1 and S2 present with no gallops, abdomen not distended and positive lower extremity edema.   Na 135, K 3,9, Cl 93, bicarbonate 31, glucose 122, bun 38 cr 1,53 BNP 690  Wbc 8.9 hgb 12.7 plt 111  TSH 0,296   Chest radiograph with cardiomegaly, with mild hilar vascular congestion, no infiltrates, pacemaker with one right atrial and ventricular lead.   CT chest and abdomen with no evidence of dissection or aneurysm, small pericardial effusion, small bilateral pleural effusions, no acute changes in the abdomen.  EKG 89 right axis deviation, normal qtc, atrial fibrillation rhythm, with no significant ST segment changes, negative T wave in V5 and V6.   Patient was placed on furosemide for diuresis.  Worsening renal function and worsening oxygenation.  05/31 acute worsening in mental status, CT head with no signs of acute CVA.  06/01 follow up head CT negative for acute CVA. Neurology recommendations to continue statin and anticoagulation with apixaban.   06/02 continue to have high oxygen requirements, on 6 L per [facility] with 02 saturation 88%.    Assessment and Plan: *  Acute on chronic diastolic CHF (congestive heart failure) (HCC) Acute on chronic hypoxemic respiratory failure due to acute cardiogenic pulmonary edema.   Echocardiogram with LV EF preserved >75% with severe concentric LVH, moderate reduction in RV systolic function, moderate enlargement in RV cavity, severe bilateral atrial enlargement. Trivial pericardial effusion.   Acute on chronic core pulmonale,  I suspect primary pulmonary hypertension, related to rheumatoid arthritis.   Follow up chest CT with signs of pulmonary edema and bilateral pleural effusion.   Documented urine output is 400, but clinically patient has improved with decreased 02 requirements this am.   02 saturation 96% on 7 L/min per HFNC.   Plan to continue diuresis with IV furosemide to further target negative fluid balance.  Continue oxymetry monitoring and supplemental 02 per HFNC to keep 02 saturation 88% or greater.   Blood pressure support with midodrine.   Chest pain Possible musculoskeletal Patient has distal interphalangeal deformities, possible RA.  Continue with acetaminophen and tramadol for pain control. Out of bed to chair tid with meals.    Acute on chronic respiratory failure with hypoxia (HCC) Follow up chest radiograph personally review with enlarged pulmonary vasculature, positive atelectasis at the right lower lobe.   CT chest with signs of pulmonary edema.  I suspect patient has pulmonary hypertension related to RA.    Continue diuresis and supplemental 0-2 per HFNC to keep 02 saturation 88% or greater.  Plan to referral to pulmonary hypertension clinic as outpatient.  Poor prognosis, in the setting of heart and renal failure.  Frequent hospitalizations. Follow up with palliative  care consultation.    Congenital deafness Interpreter at the bedside.   Acute kidney injury superimposed on chronic kidney disease (HCC) CKD stage 3b. Hyponatremia.   Renal function with serum cr at 1,24 with  K at 3,6 and serum bicarbonate at 29. Na is 128,  Plan to continue diuresis with IV furosemide 40 mg IV q12 Follow up renal function in am, avoid hypotension and nephrotoxic medications.    T2DM (type 2 diabetes mellitus) (HCC) Fasting glucose this am is 88 mg/dl.  Capillary glucose 83, 152, 155.  Plan to discontinue insulin therapy and continue capillary glucose monitoring as needed.  Patient is tolerating po well.   Anxiety Depression. Continue with escitalopram. Patient not on alprazolam at home, she had oversedation 05/31 from this medication. Will avoid further benzodiazepines.   Protein-calorie malnutrition, severe Continue with nutritional supplements.   Hypothyroidism Low TSH, but clinically euthyroid Free T4 is mildly elevated at 1,19, will continue current dose of levothyroxine and plan to follow up thyroid function as outpatient.    Acute metabolic encephalopathy Multifactorial encephalopathy.   Patient fell 05/31. No head trauma.   Her neurologic deficit of right facial droop is chronic, follow up head CT with no CVA. Plan to continue anticoagulation with apixaban and statin therapy. Close blood pressure monitoring.  Continue neuro checks per unit protocol.   Encephalopathy now has resolved.         Subjective: Patient is feeling better, dyspnea has been improving, last night had to increase 02 flow but this am able to wean down with good toleration.   Physical Exam: Vitals:   05/24/22 1216 05/24/22 1638 05/24/22 2003 05/25/22 0033  BP: 125/65 127/77 (!) 141/86 (!) 110/57  Pulse: 74 77 75 79  Resp: 16 20 18 17   Temp: (!) 97.3 F (36.3 C) 97.6 F (36.4 C) 97.6 F (36.4 C) 97.6 F (36.4 C)  TempSrc: Axillary Oral Oral Oral  SpO2: 96% 94% 94% 91%  Weight:      Height:       Neurology awake and alert ENT with mild pallor Cardiovascular with S1 and S2 present, and rhythmic, no gallops, positive murmur systolic at the right sternal border Positive  moderate JVD No lower extremity edema Respiratory with rales at bases with no wheezing or rhonchi Abdomen with no distention  Data Reviewed:    Family Communication: I spoke with patient's daughter at the bedside, we talked in detail about patient's condition, plan of care and prognosis and all questions were addressed. Interpreter at the bedside    Disposition: Status is: Inpatient Remains inpatient appropriate because: diuresis with IV furosemide   Planned Discharge Destination: Home  Author: Tawni Millers, MD 05/25/2022 9:58 AM  For on call review [email or web address].

## 2022-05-25 NOTE — Consult Note (Signed)
Palliative Medicine Inpatient Consult Note  Consulting Provider: Tawni Millers, MD  Reason for consult:   [name] Palliative Medicine Consult  Reason for Consult? pregressive health decline   05/25/2022  HPI:  Per intake H&P --> 88 yo female with congential deafness, CKD stage 3b, diastolic heart failure, atrial fibrillation, sick sinus syndrome sp PPM, coronary artery disease, hypertension, dyslipidemia and T2DM who presented with chest pain. Reported constant chest pain, upper back and upper chest, not exertion related and worse with vomiting. In the Ed her 02 saturation was 84% on 2 L/min per [place], her blood pressure 104/55, HR 80, RR 26, lungs with no wheezing, heart with S1 and S2 present with no gallops, abdomen not distended and positive lower extremity edema. Receiving treatment for congestive heart failure. Palliative care ask to assist in goals of care conversations in the setting of declining health.   Clinical Assessment/Goals of Care:  *Please note that this is a verbal dictation therefore any spelling or grammatical errors are due to the "[employer]" system interpretation.  ASL Interpretor Theodoro Clock [phone]  I have reviewed medical records including EPIC notes, labs and imaging, received report from bedside RN, assessed the patient.    I met with Veronica Young and her daughter, Veronica Young to further discuss diagnosis prognosis, GOC, EOL wishes, disposition and options.   I introduced Palliative Medicine as specialized medical care for people living with serious illness. It focuses on providing relief from the symptoms and stress of a serious illness. The goal is to improve quality of life for both the patient and the family.  Medical History Review and Understanding:  Veronica Young and I reviewed her past medical history inclusive of congenital deafness, coronary artery disease, hypertension, type 2 diabetes, chronic kidney disease, diastolic  heart failure, atrial fibrillation.  We reviewed the reasons for hospitalization inclusive of patient's diastolic heart failure exacerbation.  Social History:  Veronica Young shares with me that she is from [place], [place].  She has been a widow since 03/19/10 when her husband passed away.  She has 3 daughters and 1 son, 7 grandchildren, and 8 great-grandchildren.  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  She expresses interest in her dog Veronica Young a malti-poo, reading the paper, watching television, and enjoying flowers.  She is a woman of faith and practices within the [facility] denomination.  Functional and Nutritional State:  Prior to hospitalization Dylana had been living with her daughter Veronica Young.  She has been able to do all B ADLs on her home.  She does not use a Young or a walker.  She does have a fair appetite in the home.  Advance Directives:  Patient shares that her daughter Veronica Young is her primary Media planner.  She expresses that there are advanced directives to support this.  Code Status:  I confirmed with Amarachukwu that she is a DO NOT RESUSCITATE, DO NOT INTUBATE CODE STATUS.  She would not want prolonged life-sustaining measures to live.  She accepts when it is her time to go.  Discussion:  I reviewed with Jaylani that her progressive heart failure.  We discussed the various stages of heart failure and how over time it gets to a point where despite the best medical therapy the disease progression is not able to be inhibited.  I shared it is then when we start seeing recurrent hospitalizations and an inability to appropriately diurese patients that we start  discussing the concept of hospice care.  I explained hospice as a form of care for patients who have a terminal diagnosis of 6 months or less.  We discussed that the goals change from curative to symptom management in an effort to maintain dignity and quality at the end of life.  For the time  being Veronica Young is hopeful to be medically optimized and go back home.  She is anxious to get home sooner than later.  We reviewed that she is still on quite a bit of oxygen support and that will need to be gotten down to her baseline prior to discharge.  Discussed the importance of continued conversation with family and their  medical providers regarding overall plan of care and treatment options, ensuring decisions are within the context of the patients values and GOCs.  Decision Maker: Veronica Young (daughter) [phone]  SUMMARY OF RECOMMENDATIONS   DNAR/DNI  Discussion held regarding patient's diastolic heart failure  Review of outpatient palliative care as well as hospice care  We will recommend outpatient palliative support on discharge for continued symptom management as well as goals of care conversations  Ongoing palliative care support  Code Status/Advance Care Planning: DNAR/DNI   Palliative Prophylaxis:  Aspiration, Bowel Regimen, Delirium Protocol, Frequent Pain Assessment, Oral Care, Palliative Wound Care, and Turn Reposition  Additional Recommendations (Limitations, Scope, Preferences): Continue current level of care  Psycho-social/Spiritual:  Desire for further Chaplaincy support: Yes-[employer] Additional Recommendations: Education on diastolic heart failure   Prognosis: 3 admissions in the last 6 months, significant diastolic heart failure, additional comorbidities, high 70-monthmortality risk.  Discharge Planning: Discharge home once medically optimized.  Vitals:   05/24/22 2003 05/25/22 0033  BP: (!) 141/86 (!) 110/57  Pulse: 75 79  Resp: 18 17  Temp: 97.6 F (36.4 C) 97.6 F (36.4 C)  SpO2: 94% 91%    Intake/Output Summary (Last 24 hours) at 05/25/2022 0700 Last data filed at 05/25/2022 0343 Gross per 24 hour  Intake --  Output 400 ml  Net -400 ml   Last Weight  Most recent update: 05/24/2022  3:18 AM    Weight  52.5 kg (115 lb 11.9 oz)             Gen: Elderly Caucasian female in no acute distress HEENT: moist mucous membranes CV: Regular rate and irregular rhythm, no murmurs rubs or gallops PULM: On 10 L/min nasal cannula ABD: soft/nontender EXT: No edema Neuro: Alert and oriented x3 -uses sign language for communication  PPS: 60%   This conversation/these recommendations were discussed with patient primary care team, Dr. ACathlean Sauer Total Time: 982 Billing based on MDM: High  Problems Addressed: One acute or chronic illness or injury that poses a threat to life or bodily function  Amount and/or Complexity of Data: Category 3:Discussion of management or test interpretation with external physician/other qualified health care professional/appropriate source (not separately reported)  Risks: Decision not to resuscitate or to de-escalate care because of poor prognosis ______________________________________________________ MAcworthTeam Team Cell Phone: [phone] utilize secure chat with additional questions, if there is no response within 30 minutes please call the above phone number  Palliative Medicine Team providers are available by phone from 7am to 7pm daily and can be reached through the team cell phone.  Should this patient require assistance outside of these hours, please call the patient's attending physician.

## 2022-05-25 NOTE — Progress Notes (Signed)
Progress Note  Patient Name: Veronica Young Date of Encounter: 05/25/2022  Primary Cardiologist:   Peter [name], MD   Subjective   Still with some SOB.   No pain.  Weak  Inpatient Medications    Scheduled Meds:  apixaban  2.5 mg Oral BID   atropine  1 drop Left Eye QHS   brimonidine  1 drop Both Eyes BID   escitalopram  10 mg Oral Daily   feeding supplement  237 mL Oral TID BM   fluorometholone  1 drop Left Eye QID   furosemide  40 mg Intravenous BID   gabapentin  100 mg Oral QHS   insulin aspart  0-5 Units Subcutaneous QHS   insulin aspart  0-9 Units Subcutaneous TID WC   latanoprost  1 drop Both Eyes BID   levothyroxine  100 mcg Oral Q0600   midodrine  10 mg Oral BID WC   rosuvastatin  10 mg Oral Daily   Continuous Infusions:  PRN Meds: acetaminophen **OR** acetaminophen   Vital Signs    Vitals:   05/24/22 1216 05/24/22 1638 05/24/22 2003 05/25/22 0033  BP: 125/65 127/77 (!) 141/86 (!) 110/57  Pulse: 74 77 75 79  Resp: 16 20 18 17   Temp: (!) 97.3 F (36.3 C) 97.6 F (36.4 C) 97.6 F (36.4 C) 97.6 F (36.4 C)  TempSrc: Axillary Oral Oral Oral  SpO2: 96% 94% 94% 91%  Weight:      Height:        Intake/Output Summary (Last 24 hours) at 05/25/2022 0951 Last data filed at 05/25/2022 0343 Gross per 24 hour  Intake --  Output 100 ml  Net -100 ml   Filed Weights   05/22/22 0432 05/23/22 0402 05/24/22 0314  Weight: 52.2 kg 53 kg 52.5 kg    Telemetry    Atrial fib with rate control and PVCs.  - Personally Reviewed  ECG    NA - Personally Reviewed  Physical Exam   GEN: No acute distress.  Frail Neck: Positive   JVD Cardiac: Irregular RR, no murmurs, rubs, or gallops.  Respiratory:   Decreased breath sounds right greater than left GI: Soft, nontender, non-distended  MS: No  edema; No deformity. Neuro:  Nonfocal  Psych: Normal affect   Labs    Chemistry Recent Labs  Lab 05/20/22 1742 05/21/22 0647 05/22/22 0249 05/23/22 0204  05/24/22 1112 05/25/22 0151  NA 135 135 131* 132* 134* 128*  K 3.9 3.8 4.3 3.9 4.0 3.6  CL 93* 95* 97* 96* 98 91*  CO2 31 31 27 28 28 29   GLUCOSE 122* 110* 152* 106* 182* 88  BUN 38* 40* 53* 53* 50* 33*  CREATININE 1.53* 1.76* 2.01* 1.98* 1.70* 1.24*  CALCIUM 9.0 9.0 8.7* 8.6* 8.6* 8.1*  PROT 7.4 6.7 6.3*  --   --   --   ALBUMIN 3.6 3.1* 2.9*  --   --   --   AST 43* 38 32  --   --   --   ALT 23 22 21   --   --   --   ALKPHOS 163* 149* 142*  --   --   --   BILITOT 2.0* 1.9* 1.4*  --   --   --   GFRNONAA 33* 28* 24* 24* 29* 42*  ANIONGAP 11 9 7 8 8 8      Hematology Recent Labs  Lab 05/21/22 0647 05/22/22 0249 05/25/22 0151  WBC 9.1 10.1 5.8  RBC 4.26 3.93  3.83*  HGB 12.1 11.1* 10.8*  HCT 37.2 34.7* 33.7*  MCV 87.3 88.3 88.0  MCH 28.4 28.2 28.2  MCHC 32.5 32.0 32.0  RDW 18.3* 18.4* 17.7*  PLT 102* 115* 134*    Cardiac EnzymesNo results for input(s): TROPONINI in the last 168 hours. No results for input(s): TROPIPOC in the last 168 hours.   BNP Recent Labs  Lab 05/20/22 1742  BNP 690.5*     DDimer No results for input(s): DDIMER in the last 168 hours.   Radiology    CT HEAD WO CONTRAST (5MM)  Result Date: 05/24/2022 CLINICAL DATA:  88 year old female with neurologic deficit. Stroke versus encephalopathy from CHF exacerbation. But MRI incompatible cardiac pacemaker. History of atrial fibrillation on Eliquis EXAM: CT HEAD WITHOUT CONTRAST TECHNIQUE: Contiguous axial images were obtained from the base of the skull through the vertex without intravenous contrast. RADIATION DOSE REDUCTION: This exam was performed according to the departmental dose-optimization program which includes automated exposure control, adjustment of the mA and/or kV according to patient size and/or use of iterative reconstruction technique. COMPARISON:  Head CT without contrast 05/22/2022 and earlier. FINDINGS: Brain: Fairly extensive but chronic bilateral basal ganglia vascular calcifications,  present in 2014. No midline shift, mass effect, or evidence of intracranial mass lesion. No ventriculomegaly. Patchy asymmetric bilateral cerebral white matter hypodensity appears stable from 05/22/2022. Deep gray matter nuclei and brainstem appear relatively spared. No cortically based acute infarct identified. No acute intracranial hemorrhage identified. Vascular: Extensive Calcified atherosclerosis at the skull base. No suspicious intracranial vascular hyperdensity. Skull: Hyperostosis of the calvarium. No acute osseous abnormality identified. Sinuses/Orbits: Visualized paranasal sinuses and mastoids are stable and well aerated. Other: No acute orbit or scalp soft tissue finding. IMPRESSION: Stable non contrast CT appearance of the brain. No evolving infarct or acute intracranial hemorrhage identified. Electronically Signed   By: Genevie Ann M.D.   On: 05/24/2022 05:05   CT CHEST WO CONTRAST  Result Date: 05/24/2022 CLINICAL DATA:  Chronic dyspnea EXAM: CT CHEST WITHOUT CONTRAST TECHNIQUE: Multidetector CT imaging of the chest was performed following the standard protocol without IV contrast. RADIATION DOSE REDUCTION: This exam was performed according to the departmental dose-optimization program which includes automated exposure control, adjustment of the mA and/or kV according to patient size and/or use of iterative reconstruction technique. COMPARISON:  05/20/2022 FINDINGS: Cardiovascular: Coronary artery calcifications are seen. Evaluation of vascular structures is limited without contrast. Pacer leads are noted in place. Pacemaker battery is seen in the left infraclavicular region. There is interval increase in size of small pericardial effusion. Mediastinum/Nodes: No new significant lymphadenopathy seen. Thyroid is smaller than usual in size. Lungs/Pleura: There are faint ground-glass densities in the parahilar regions and lower lung fields. Moderate to large bilateral pleural effusions are seen, more so on  the left side. There is interval increase in bilateral pleural effusions. There are patchy infiltrates in both lower lung fields, more so on the left side suggesting atelectasis/pneumonia. There is no pneumothorax. Upper Abdomen: Unremarkable. Musculoskeletal: There are patchy foci of sclerosis in the skeletal structures. IMPRESSION: Cardiomegaly.  Coronary artery calcifications are seen. There are ground-glass densities in the parahilar regions suggesting possible pulmonary edema. Moderate large bilateral pleural effusions with significant interval increase in size. There is increase in size pericardial effusion. There are new patchy infiltrates in both lower lung fields, more so on the left side suggesting atelectasis/pneumonia. Other findings as described in the body of the report. Electronically Signed   By: Prudy Feeler.D.  On: 05/24/2022 16:35   VAS US CAROTID  Result Date: 05/23/2022 Carotid Arterial Duplex Study Patient Name:  LATRESHA YAHR Petraglia  Date of Exam:   05/23/2022 Medical Rec #: [id number]       Accession #:    [id number] Date of Birth: [birth year]      Patient Gender: F Patient Age:   89 years Exam Location:  [facility] Procedure:      VAS US CAROTID Referring Phys: Cornelius Moras XU --------------------------------------------------------------------------------  Indications:       Bilateral bruits. Risk Factors:      Hypertension, hyperlipidemia, Diabetes. Other Factors:     Congenital deafness, CKD IIIb, atrial fibrillation,                    Pcemaker. Comparison Study:  Prior normal carotid duplex on file from 10/13/2009 Performing Technologist: Sharion Dove RVS  Examination Guidelines: A complete evaluation includes B-mode imaging, spectral Doppler, color Doppler, and power Doppler as needed of all accessible portions of each vessel. Bilateral testing is considered an integral part of a complete examination. Limited examinations for reoccurring indications may be performed as noted.   Right Carotid Findings: +----------+--------+--------+--------+------------------+------------------+           PSV cm/sEDV cm/sStenosisPlaque DescriptionComments           +----------+--------+--------+--------+------------------+------------------+ CCA Prox  109     14                                intimal thickening +----------+--------+--------+--------+------------------+------------------+ CCA Distal72      11                                intimal thickening +----------+--------+--------+--------+------------------+------------------+ ICA Prox  66      16              calcific                             +----------+--------+--------+--------+------------------+------------------+ ICA Distal76      14                                                   +----------+--------+--------+--------+------------------+------------------+ ECA       110     7                                                    +----------+--------+--------+--------+------------------+------------------+ +----------+--------+-------+--------+-------------------+           PSV cm/sEDV cmsDescribeArm Pressure (mmHG) +----------+--------+-------+--------+-------------------+ Subclavian105                                        +----------+--------+-------+--------+-------------------+ +---------+--------+--+--------+--+ VertebralPSV cm/s67EDV cm/s16 +---------+--------+--+--------+--+  Left Carotid Findings: +----------+--------+--------+--------+------------------+------------------+           PSV cm/sEDV cm/sStenosisPlaque DescriptionComments           +----------+--------+--------+--------+------------------+------------------+ CCA Prox  71      12  intimal thickening +----------+--------+--------+--------+------------------+------------------+ CCA Distal77      14                                intimal thickening  +----------+--------+--------+--------+------------------+------------------+ ICA Prox  45      10              calcific                             +----------+--------+--------+--------+------------------+------------------+ ICA Distal41      7                                                    +----------+--------+--------+--------+------------------+------------------+ ECA       84      2                                                    +----------+--------+--------+--------+------------------+------------------+ +----------+--------+--------+--------+-------------------+           PSV cm/sEDV cm/sDescribeArm Pressure (mmHG) +----------+--------+--------+--------+-------------------+ [id number]                                         +----------+--------+--------+--------+-------------------+ +---------+--------+--+--------+--+ VertebralPSV cm/s67EDV cm/s14 +---------+--------+--+--------+--+   Summary: Right Carotid: Velocities in the right ICA are consistent with a 1-39% stenosis. Left Carotid: Velocities in the left ICA are consistent with a 1-39% stenosis. Vertebrals:  Bilateral vertebral arteries demonstrate antegrade flow. Subclavians: Normal flow hemodynamics were seen in bilateral subclavian              arteries. *See table(s) above for measurements and observations.  Electronically signed by Orlie Pollen on 05/23/2022 at 5:22:52 PM.    Final     Cardiac Studies   Echo 05/11/22   1. Compared to echo from Jan 2023, no significant change in LVEF and  RVEF.   2. Left ventricular ejection fraction, by estimation, is >75%. The left  ventricle has hyperdynamic function. The left ventricle has no regional  wall motion abnormalities. There is severe concentric left ventricular  hypertrophy. Left ventricular  diastolic parameters are indeterminate.   3. Right ventricular systolic function is moderately reduced. The right  ventricular size is moderately  enlarged.   4. Left atrial size was severely dilated.   5. Right atrial size was severely dilated.   6. The mitral valve is normal in structure. Mild mitral valve  regurgitation.   7. The aortic valve is tricuspid. Aortic valve regurgitation is not  visualized. Aortic valve sclerosis is present, with no evidence of aortic  valve stenosis.   8. The inferior vena cava is dilated in size with <50% respiratory  variability, suggesting right atrial pressure of 15 mmHg.  Echo: Limited to look at pericardial effusion.  I reviewed this at the bedside and there is no pericardial effusion .  It looks like pleural effusion.     Patient Profile     88 y.o. female with a hx of congenital deafness, chronic diastolic CHF, persistent atrial fibrillation on eliquis, paroxysmal  atrial tachycardia, sick sinus syndrome s/p [employer] PPM, CAD, PAD, HTN, HLD, type 2 DM, hypothyroidism CKD stage IIIb who was seen 05/21/2022 for the evaluation of CHF, chest pain, atrial fibrillation at the request of Dr. Broadus John.    Assessment & Plan    Acute on Chronic hypoxic respiratory failure/Acute on Chronic diastolic heart failure:   Na is down to 128.    1200 cc free water restrict.  OK to continue IV Lasix.  Might want to repeat a CXR in the AM.  I looked at the CXR from a couple of days ago and the pleural effusion on the right was moderate.  More prominent on CT.  Might benefit from thoracentesis if effusion enlarges on follow up CXR.    CKD stage III: Cr 1.24.  Follow now that Lasix is restarted.    Chest Pain:  Felt to be non anginal.  No change in therapy.    Persistent A-Fib:     Continue eliquis 2.5 mg BID.     SSS s/p PPM:  Followed out patient by Dr. Lovena Le.      For questions or updates, please contact [place] Please consult [email or web address] for contact info under Cardiology/STEMI.   Signed, Minus Breeding, MD  05/25/2022, 9:51 AM

## 2022-05-25 NOTE — Progress Notes (Signed)
On 9L high flow nasal cannula awake. O2 desats while sleeping, now needing up to 14L HFNC to maintain sats in lower 90s. Will wean as patient wakes.

## 2022-05-25 NOTE — Progress Notes (Signed)
Paged by primary RN that patients sBP >140s asking about midodrine. Appears to have been started on 05/21/22 when she had acute hypotensive episode and was much less stable then she currently is. No symptomatic hypotension in the past several days. Will d/c midodrine, asked primary RN to page medicine team to make sure they were aware of medication change as well since I am not primary.

## 2022-05-26 DIAGNOSIS — E119 Type 2 diabetes mellitus without complications: Secondary | ICD-10-CM

## 2022-05-26 DIAGNOSIS — I5033 Acute on chronic diastolic (congestive) heart failure: Secondary | ICD-10-CM | POA: Diagnosis not present

## 2022-05-26 DIAGNOSIS — N179 Acute kidney failure, unspecified: Secondary | ICD-10-CM | POA: Diagnosis not present

## 2022-05-26 DIAGNOSIS — G9341 Metabolic encephalopathy: Secondary | ICD-10-CM | POA: Diagnosis not present

## 2022-05-26 DIAGNOSIS — R079 Chest pain, unspecified: Secondary | ICD-10-CM | POA: Diagnosis not present

## 2022-05-26 LAB — BASIC METABOLIC PANEL
Anion gap: 8 (ref 5–15)
BUN: 37 mg/dL — ABNORMAL HIGH (ref 8–23)
CO2: 32 mmol/L (ref 22–32)
Calcium: 8.3 mg/dL — ABNORMAL LOW (ref 8.9–10.3)
Chloride: 96 mmol/L — ABNORMAL LOW (ref 98–111)
Creatinine, Ser: 1.47 mg/dL — ABNORMAL HIGH (ref 0.44–1.00)
GFR, Estimated: 35 mL/min — ABNORMAL LOW (ref >60)
Glucose, Bld: 97 mg/dL (ref 70–99)
Potassium: 3.6 mmol/L (ref 3.5–5.1)
Sodium: 136 mmol/L (ref 135–145)

## 2022-05-26 LAB — GLUCOSE, CAPILLARY: Glucose-Capillary: 105 mg/dL — ABNORMAL HIGH (ref 70–99)

## 2022-05-26 MED ORDER — MUSCLE RUB 10-15 % EX CREA
TOPICAL_CREAM | Freq: Two times a day (BID) | CUTANEOUS | Status: DC | PRN
  Filled 2022-05-26: qty 85

## 2022-05-26 MED ORDER — TRAMADOL HCL 50 MG PO TABS
50.0000 mg | ORAL_TABLET | Freq: Once | ORAL | Status: AC | PRN
  Administered 2022-05-26: 25 mg via ORAL
  Filled 2022-05-26: qty 1

## 2022-05-26 NOTE — Progress Notes (Addendum)
Progress Note  Patient Name: Veronica Young Date of Encounter: 05/26/2022  Primary Cardiologist:   Peter [name], MD   Subjective   She is on 7 liters but thinks her breathing as at baseline.  She was no 5 liters before admission.   Inpatient Medications    Scheduled Meds:  apixaban  2.5 mg Oral BID   atropine  1 drop Left Eye QHS   brimonidine  1 drop Both Eyes BID   escitalopram  10 mg Oral Daily   feeding supplement  237 mL Oral TID BM   fluorometholone  1 drop Left Eye QID   furosemide  40 mg Intravenous BID   gabapentin  100 mg Oral QHS   latanoprost  1 drop Both Eyes BID   levothyroxine  100 mcg Oral Q0600   rosuvastatin  10 mg Oral Daily   Continuous Infusions:  PRN Meds: acetaminophen **OR** acetaminophen, Muscle Rub   Vital Signs    Vitals:   05/25/22 1825 05/25/22 2100 05/26/22 0300 05/26/22 0619  BP: (!) 147/91 139/74  136/90  Pulse:  78  78  Resp:  17  18  Temp:  (!) 97.4 F (36.3 C)  97.9 F (36.6 C)  TempSrc:  Oral  Oral  SpO2:  99%  97%  Weight:   50.9 kg   Height:        Intake/Output Summary (Last 24 hours) at 05/26/2022 1007 Last data filed at 05/26/2022 0525 Gross per 24 hour  Intake --  Output 475 ml  Net -475 ml   Filed Weights   05/23/22 0402 05/24/22 0314 05/26/22 0300  Weight: 53 kg 52.5 kg 50.9 kg    Telemetry    Atrial fib with rate control and PVCs.  - Personally Reviewed  ECG    NA - Personally Reviewed  Physical Exam   GEN: No  acute distress.    Frail Neck: No  JVD Cardiac: Irregular RR, no murmurs, rubs, or gallops.  Respiratory:       Decreased breath sounds right greater than left base.  GI: Soft, nontender, non-distended, normal bowel sounds  MS:  Trace leg edema; No deformity. Neuro:   Nonfocal  Psych: Oriented and appropriate     Labs    Chemistry Recent Labs  Lab 05/20/22 1742 05/21/22 0647 05/22/22 0249 05/23/22 0204 05/24/22 1112 05/25/22 0151 05/26/22 0240  NA 135 135 131*   < > 134* 128*  136  K 3.9 3.8 4.3   < > 4.0 3.6 3.6  CL 93* 95* 97*   < > 98 91* 96*  CO2 31 31 27    < > 28 29 32  GLUCOSE 122* 110* 152*   < > 182* 88 97  BUN 38* 40* 53*   < > 50* 33* 37*  CREATININE 1.53* 1.76* 2.01*   < > 1.70* 1.24* 1.47*  CALCIUM 9.0 9.0 8.7*   < > 8.6* 8.1* 8.3*  PROT 7.4 6.7 6.3*  --   --   --   --   ALBUMIN 3.6 3.1* 2.9*  --   --   --   --   AST 43* 38 32  --   --   --   --   ALT 23 22 21   --   --   --   --   ALKPHOS 163* 149* 142*  --   --   --   --   BILITOT 2.0* 1.9* 1.4*  --   --   --   --  GFRNONAA 33* 28* 24*   < > 29* 42* 35*  ANIONGAP 11 9 7    < > 8 8 8    < > = values in this interval not displayed.     Hematology Recent Labs  Lab 05/21/22 0647 05/22/22 0249 05/25/22 0151  WBC 9.1 10.1 5.8  RBC 4.26 3.93 3.83*  HGB 12.1 11.1* 10.8*  HCT 37.2 34.7* 33.7*  MCV 87.3 88.3 88.0  MCH 28.4 28.2 28.2  MCHC 32.5 32.0 32.0  RDW 18.3* 18.4* 17.7*  PLT 102* 115* 134*    Cardiac EnzymesNo results for input(s): TROPONINI in the last 168 hours. No results for input(s): TROPIPOC in the last 168 hours.   BNP Recent Labs  Lab 05/20/22 1742  BNP 690.5*     DDimer No results for input(s): DDIMER in the last 168 hours.   Radiology    CT CHEST WO CONTRAST  Result Date: 05/24/2022 CLINICAL DATA:  Chronic dyspnea EXAM: CT CHEST WITHOUT CONTRAST TECHNIQUE: Multidetector CT imaging of the chest was performed following the standard protocol without IV contrast. RADIATION DOSE REDUCTION: This exam was performed according to the departmental dose-optimization program which includes automated exposure control, adjustment of the mA and/or kV according to patient size and/or use of iterative reconstruction technique. COMPARISON:  05/20/2022 FINDINGS: Cardiovascular: Coronary artery calcifications are seen. Evaluation of vascular structures is limited without contrast. Pacer leads are noted in place. Pacemaker battery is seen in the left infraclavicular region. There is interval  increase in size of small pericardial effusion. Mediastinum/Nodes: No new significant lymphadenopathy seen. Thyroid is smaller than usual in size. Lungs/Pleura: There are faint ground-glass densities in the parahilar regions and lower lung fields. Moderate to large bilateral pleural effusions are seen, more so on the left side. There is interval increase in bilateral pleural effusions. There are patchy infiltrates in both lower lung fields, more so on the left side suggesting atelectasis/pneumonia. There is no pneumothorax. Upper Abdomen: Unremarkable. Musculoskeletal: There are patchy foci of sclerosis in the skeletal structures. IMPRESSION: Cardiomegaly.  Coronary artery calcifications are seen. There are ground-glass densities in the parahilar regions suggesting possible pulmonary edema. Moderate large bilateral pleural effusions with significant interval increase in size. There is increase in size pericardial effusion. There are new patchy infiltrates in both lower lung fields, more so on the left side suggesting atelectasis/pneumonia. Other findings as described in the body of the report. Electronically Signed   By: Elmer Picker M.D.   On: 05/24/2022 16:35   ECHOCARDIOGRAM LIMITED  Result Date: 05/25/2022    ECHOCARDIOGRAM LIMITED REPORT   Patient Name:   Veronica Young Date of Exam: 05/25/2022 Medical Rec #:  [id number]      Height:       65.0 in Accession #:    [id number]     Weight:       115.7 lb Date of Birth:  1933/02/19     BSA:          1.568 m Patient Age:    88 years       BP:           110/57 mmHg Patient Gender: F              HR:           84 bpm. Exam Location:  Inpatient Procedure: Limited Echo, Cardiac Doppler and Color Doppler Indications:    I31.3 Pericardial effusion (noninflammatory)  History:        Patient has prior  history of Echocardiogram examinations, most                 recent 05/21/2022. CHF, CAD, Pacemaker and Abnormal ECG,                 Signs/Symptoms:Dyspnea and  Shortness of Breath; Risk                 Factors:Hypertension and Diabetes.  Sonographer:    Roseanna Rainbow RDCS Referring Phys: [id number] [place]  1. Left ventricular ejection fraction, by estimation, is 65 to 70%. The left ventricle has normal function. There is severe left ventricular hypertrophy.  2. Left atrial size was severely dilated.  3. Right atrial size was severely dilated.  4. Moderate pleural effusion.  5. Tricuspid valve regurgitation is moderate to severe.  6. Aortic valve regurgitation is not visualized.  7. There is severely elevated pulmonary artery systolic pressure. Comparison(s): No significant change from prior study. Conclusion(s)/Recommendation(s): No significant pericardial effusion. FINDINGS  Left Ventricle: Left ventricular ejection fraction, by estimation, is 65 to 70%. The left ventricle has normal function. There is severe left ventricular hypertrophy. Right Ventricle: There is severely elevated pulmonary artery systolic pressure. The tricuspid regurgitant velocity is 4.52 m/s, and with an assumed right atrial pressure of 15 mmHg, the estimated right ventricular systolic pressure is 76.7 mmHg. Left Atrium: Left atrial size was severely dilated. Right Atrium: Right atrial size was severely dilated. Pericardium: Trivial pericardial effusion is present. Tricuspid Valve: Tricuspid valve regurgitation is moderate to severe. Aortic Valve: Aortic valve regurgitation is not visualized. Aortic valve peak gradient measures 83.2 mmHg. Additional Comments: A device lead is visualized. There is a moderate pleural effusion. LEFT VENTRICLE PLAX 2D LVIDd:         3.30 cm LVIDs:         2.90 cm LV PW:         1.40 cm LV IVS:        1.60 cm LVOT diam:     1.90 cm LVOT Area:     2.84 cm  IVC IVC diam: 2.70 cm AORTIC VALVE AV Vmax:      456.00 cm/s AV Peak Grad: 83.2 mmHg TRICUSPID VALVE TR Peak grad:   81.7 mmHg TR Vmax:        452.00 cm/s  SHUNTS Systemic Diam: 1.90 cm Phineas Inches  Electronically signed by Phineas Inches Signature Date/Time: 05/25/2022/1:27:55 PM    Final     Cardiac Studies   Echo 05/11/22   1. Compared to echo from Jan 2023, no significant change in LVEF and  RVEF.   2. Left ventricular ejection fraction, by estimation, is >75%. The left  ventricle has hyperdynamic function. The left ventricle has no regional  wall motion abnormalities. There is severe concentric left ventricular  hypertrophy. Left ventricular  diastolic parameters are indeterminate.   3. Right ventricular systolic function is moderately reduced. The right  ventricular size is moderately enlarged.   4. Left atrial size was severely dilated.   5. Right atrial size was severely dilated.   6. The mitral valve is normal in structure. Mild mitral valve  regurgitation.   7. The aortic valve is tricuspid. Aortic valve regurgitation is not  visualized. Aortic valve sclerosis is present, with no evidence of aortic  valve stenosis.   8. The inferior vena cava is dilated in size with <50% respiratory  variability, suggesting right atrial pressure of 15 mmHg.  Echo:  05/25/2022   1. Left  ventricular ejection fraction, by estimation, is 65 to 70%. The  left ventricle has normal function. There is severe left ventricular  hypertrophy.   2. Left atrial size was severely dilated.   3. Right atrial size was severely dilated.   4. Moderate pleural effusion.   5. Tricuspid valve regurgitation is moderate to severe.   6. Aortic valve regurgitation is not visualized.   7. There is severely elevated pulmonary artery systolic pressure.   Patient Profile     88 y.o. female with a hx of congenital deafness, chronic diastolic CHF, persistent atrial fibrillation on eliquis, paroxysmal atrial tachycardia, sick sinus syndrome s/p [employer] PPM, CAD, PAD, HTN, HLD, type 2 DM, hypothyroidism CKD stage IIIb who was seen 05/21/2022 for the evaluation of CHF, chest pain, atrial fibrillation at the request of Dr.  Broadus John.    Assessment & Plan    Acute on Chronic hypoxic respiratory failure/Acute on Chronic diastolic heart failure:  Negative pericardial effusion.  Na is up to 136.   Net negative 2 liters this admit but I/O incomplete.  She very much would like to go home and I think she could be discharged on 40 mg Lasix PO bid which was her previous dose.  However, we would need to watch the BMET closely.    We will arrange a follow up in our office for an appt.    CKD stage III: Cr is up slightly with diuresis.  Follow as an out patient.    Persistent A-Fib:     Continue Eliquis at the current dose.  Rate is well controlled.   SSS s/p PPM:  Followed by Dr. Lovena Le as an out patient.     For questions or updates, please contact [place] Please consult [email or web address] for contact info under Cardiology/STEMI.   Signed, Minus Breeding, MD  05/26/2022, 10:07 AM

## 2022-05-26 NOTE — Progress Notes (Signed)
   Palliative Medicine Inpatient Follow Up Note  HPI: 88 yo female with congential deafness, CKD stage 3b, diastolic heart failure, atrial fibrillation, sick sinus syndrome sp PPM, coronary artery disease, hypertension, dyslipidemia and T2DM who presented with chest pain. Reported constant chest pain, upper back and upper chest, not exertion related and worse with vomiting. In the Ed her 02 saturation was 84% on 2 L/min per [place], her blood pressure 104/55, HR 80, RR 26, lungs with no wheezing, heart with S1 and S2 present with no gallops, abdomen not distended and positive lower extremity edema. Receiving treatment for congestive heart failure. Palliative care ask to assist in goals of care conversations in the setting of declining health.   Today's Discussion 05/26/2022  *Please note that this is a verbal dictation therefore any spelling or grammatical errors are due to the "[place] [employer]" system interpretation.  ASL Interpretation through [facility] #[id number]  Chart reviewed inclusive of vital signs, progress notes, laboratory results, and diagnostic images.   I met at bedside with Veronica Young this morning. She is pleasant and shares no shortness of breath or pain. Created space and opportunity for patient to explore thoughts feelings and fears regarding current medical situation. She shares eagerness to get out of the hospital setting.   Discussed heart failure and it's progressive nature. Reviewed patients goals for continued optimization. We talked about her decrease O2 requirements and how this is encouraging though she is still not at her BL O2.   Veronica Young is most interesting in getting bathed this morning. I was able to pass this along to the nursing technician.   Questions and concerns addressed   Palliative Support Provided  Objective Assessment: Vital Signs Vitals:   05/25/22 2100 05/26/22 0619  BP: 139/74 136/90  Pulse: 78 78  Resp: 17 18  Temp: (!) 97.4 F (36.3 C) 97.9 F (36.6 C)   SpO2: 99% 97%    Intake/Output Summary (Last 24 hours) at 05/26/2022 0915 Last data filed at 05/26/2022 5638 Gross per 24 hour  Intake --  Output 475 ml  Net -475 ml   Last Weight  Most recent update: 05/26/2022  4:41 AM    Weight  50.9 kg (112 lb 3.4 oz)            Gen: Elderly Caucasian female in no acute distress HEENT: moist mucous membranes CV: Regular rate and irregular rhythm, no murmurs rubs or gallops PULM: On 10 L/min nasal cannula ABD: soft/nontender EXT: No edema Neuro: Alert and oriented x3 -uses sign language for communication  SUMMARY OF RECOMMENDATIONS   DNAR/DNI   Discussion held regarding patient's diastolic heart failure   Review of outpatient palliative care as well as hospice care   We will recommend outpatient palliative support on discharge for continued symptom management as well as goals of care conversations   Ongoing incremental palliative care support as goals are clear at this time  Billing based on MDM: Moderate ______________________________________________________________________________________ [place] Team Team Cell Phone: [phone] Please utilize secure chat with additional questions, if there is no response within 30 minutes please call the above phone number  Palliative Medicine Team providers are available by phone from 7am to 7pm daily and can be reached through the team cell phone.  Should this patient require assistance outside of these hours, please call the patient's attending physician.

## 2022-05-26 NOTE — Progress Notes (Signed)
Mobility Specialist Progress Note:   05/26/22 1231  Mobility  Activity Ambulated with assistance in hallway  Level of Assistance Standby assist, set-up cues, supervision of patient - no hands on  Assistive Device Front wheel walker  Distance Ambulated (ft) 150 ft  Activity Response Tolerated well  $Mobility charge 1 Mobility   Pt received in bed willing to participate in mobility. No complaints of pain. Pt stated she was a little SOB after ambulation. Left in bed with call bell in reach and all needs met.  Interpreter: Minette Brine [phone]  [facility] [place] Mobility Specialist

## 2022-05-26 NOTE — Progress Notes (Incomplete)
Still on high flow nasal canula 7L oxygen with sats low 90s. Per family continuing to skip gabapentin. They expressed that it made her out of it and drowsy. Patient having leg/knee pain that she expresses as chronic (also has chronic back pain). Unable to think of what med she takes but says she takes a dose before bed each night at home. Paged physician and received 1 time dose of tramadol and PRN muscle rub cream. Patient afraid to take full dose of tramadol, stating doesn't want to be too drowsy, so given half dose. Patient resting again.

## 2022-05-26 NOTE — Progress Notes (Signed)
Progress Note  Patient: Veronica Young RXV:[id number] DOB: 1933-04-05  DOA: 05/20/2022  DOS: 05/26/2022    Brief hospital course: Veronica Young was admitted to the hospital with the working diagnosis of chest pain, complicated with congested heart failure, in the setting of pulmonary hypertension. Acute on chronic hypoxemic respiratory failure.   88 yo female with congential deafness, CKD stage 3b, diastolic heart failure, atrial fibrillation, sick sinus syndrome sp PPM, coronary artery disease, hypertension, dyslipidemia and T2DM who presented with chest pain. Reported constant chest pain, upper back and upper chest, not exertion related and worse with vomiting. In the Ed her 02 saturation was 84% on 2 L/min per Veronica Young, her blood pressure 104/55, HR 80, RR 26, lungs with no wheezing, heart with S1 and S2 present with no gallops, abdomen not distended and positive lower extremity edema.   Na 135, K 3,9, Cl 93, bicarbonate 31, glucose 122, bun 38 cr 1,53 BNP 690  Wbc 8.9 hgb 12.7 plt 111  TSH 0,296   Chest radiograph with cardiomegaly, with mild hilar vascular congestion, no infiltrates, pacemaker with one right atrial and ventricular lead.   CT chest and abdomen with no evidence of dissection or aneurysm, small pericardial effusion, small bilateral pleural effusions, no acute changes in the abdomen.  EKG 89 right axis deviation, normal qtc, atrial fibrillation rhythm, with no significant ST segment changes, negative T wave in V5 and V6.   Patient was placed on furosemide for diuresis.  Worsening renal function and worsening oxygenation.  05/31 acute worsening in mental status, CT head with no signs of acute CVA.  06/01 follow up head CT negative for acute CVA. Neurology recommendations to continue statin and anticoagulation with apixaban.   06/02 continue to have high oxygen requirements, on 6 L per HFNC with 02 saturation 88%.    Assessment and Plan: Acute on chronic hypoxemic respiratory  failure: Due to acute on chronic HFpEF with pulmonary edema and ongoing pleural effusions as well as progressive pulmonary HTN, possibly related to RA.  - Wean oxygen as tolerated back to home dose if possible. Remains on 7LPM O2 which is not her baseline. I'm not completely sure that's deliverable at home.  - Offered patient discharge home with palliative care at this time vs. (recommended) thoracentesis first to reduce risk of failing to thrive. She opts for thoracentesis which is ordered. - Plan to referral to pulmonary hypertension clinic as outpatient.  - Overall limited life expectancy with advanced age, frequent hospitalizations, multiorgan failure. Palliative care conversations ongoing, currently in mode of aggressive interventions though is DNR. Will follow up with palliative care after discharge.   Acute on chronic HFpEF and cor pulmonale: LVEF >75% with severe concentric LVH, moderate reduction in RV systolic function, moderate enlargement in RV cavity, severe BAE, trivial pericardial effusion. Repeat echo showed pleural, not pericardial effusion. - Continue lasix 40mg  IV BID.  - Monitor I/O (suspect incompletely documented), daily weights (53kg > 50.9kg) - No longer requiring midodrine BP support.   Persistent AFib, SSS s/p medtronic PPM:  - Continue eliquis, rate is controlled.  - F/u with Dr. Lovena Young as outpatient.   Hyponatremia:  - Continue fluid restriction. Resolved.   Chest pain: Not suggestive of ACS.  - Continue acetaminophen and tramadol for pain control.  Congenital deafness:  - ASL interpretor used throughout encounters.  AKI on stage IIIb CKD:  - Monitor daily  T2DM: HbA1c 6.1%.  - No need to continue checking CBGs regularly, at inpatient goal.  Anxiety, depression: Quiescent.  - Continue escitalopram, avoid benzodiazepines as this caused sedation 5/31.    Protein-calorie malnutrition, severe - Continue with nutritional supplements.   Hypothyroidism: With  mildly low TSH, mildly elevated free T3 and low free T3. Unclear etiology, though changes are mild and no attributable symptoms, does not appear clinically hyperthyroid.  - Continue synthroid at current dose and recheck TFTs at follow up.   Acute metabolic encephalopathy: Improved.   Patient fell 05/31. No head trauma. Her neurologic deficit of right facial droop is chronic, follow up head CT with no CVA. - Plan to continue anticoagulation with apixaban and statin therapy. - Fall precautions  Thrombocytopenia:  - Monitor in AM.  Hyperbilirubinemia: Mild, improving.   Glaucoma:  - Continue gtt's  Subjective: Told cardiologist she wanted to go home, told palliative care NP she wanted to go home. When I ask if she feels her breathing is better/good enough to go home she says she doesn't think so. Would rather try a thoracentesis to see if her oxygen needs can come back down. ASL interpretor Veronica Young, [id number], used throughout encounter.   Objective: Vitals:   05/25/22 1825 05/25/22 2100 05/26/22 0300 05/26/22 0619  BP: (!) 147/91 139/74  136/90  Pulse:  78  78  Resp:  17  18  Temp:  (!) 97.4 F (36.3 C)  97.9 F (36.6 C)  TempSrc:  Oral  Oral  SpO2:  99%  97%  Weight:   50.9 kg   Height:       Gen: Frail, elderly female in no distress Pulm: Nonlabored but tachypneic, diminished bilaterally without wheezes. +crackles.  CV: Irreg irreg without murmur, rub, or gallop. No JVD, no significant dependent edema. GI: Abdomen soft, non-tender, non-distended, with normoactive bowel sounds.  Ext: Warm, no deformities Skin: No rashes, lesions or ulcers on visualized skin. Neuro: Alert and oriented. No focal neurological deficits. Chronically deaf. Psych: Judgement and insight appear fair. Mood euthymic & affect congruent. Behavior is appropriate.    Data Personally reviewed:  CBC: Recent Labs  Lab 05/20/22 1742 05/21/22 0647 05/22/22 0249 05/25/22 0151  WBC 8.9 9.1 10.1 5.8   NEUTROABS 6.9  --   --  3.7  HGB 12.7 12.1 11.1* 10.8*  HCT 39.5 37.2 34.7* 33.7*  MCV 88.4 87.3 88.3 88.0  PLT 111* 102* 115* 169*   Basic Metabolic Panel: Recent Labs  Lab 05/20/22 1742 05/21/22 0647 05/22/22 0249 05/23/22 0204 05/24/22 1112 05/25/22 0151 05/26/22 0240  NA 135   < > 131* 132* 134* 128* 136  K 3.9   < > 4.3 3.9 4.0 3.6 3.6  CL 93*   < > 97* 96* 98 91* 96*  CO2 31   < > 27 28 28 29  32  GLUCOSE 122*   < > 152* 106* 182* 88 97  BUN 38*   < > 53* 53* 50* 33* 37*  CREATININE 1.53*   < > 2.01* 1.98* 1.70* 1.24* 1.47*  CALCIUM 9.0   < > 8.7* 8.6* 8.6* 8.1* 8.3*  MG 2.1  --   --  2.4  --   --   --    < > = values in this interval not displayed.   GFR: Estimated Creatinine Clearance: 22.1 mL/min (A) (by C-G formula based on SCr of 1.47 mg/dL (H)). Liver Function Tests: Recent Labs  Lab 05/20/22 1742 05/21/22 0647 05/22/22 0249  AST 43* 38 32  ALT 23 22 21   ALKPHOS 163* 149* 142*  BILITOT  2.0* 1.9* 1.4*  PROT 7.4 6.7 6.3*  ALBUMIN 3.6 3.1* 2.9*   Recent Labs  Lab 05/20/22 1742  LIPASE 36   No results for input(s): AMMONIA in the last 168 hours. Coagulation Profile: No results for input(s): INR, PROTIME in the last 168 hours. Cardiac Enzymes: No results for input(s): CKTOTAL, CKMB, CKMBINDEX, TROPONINI in the last 168 hours. BNP (last 3 results) No results for input(s): PROBNP in the last 8760 hours. HbA1C: No results for input(s): HGBA1C in the last 72 hours. CBG: Recent Labs  Lab 05/24/22 1636 05/24/22 2103 05/25/22 0823 05/25/22 2209 05/26/22 0819  GLUCAP 83 152* 155* 126* 105*   Lipid Profile: No results for input(s): CHOL, HDL, LDLCALC, TRIG, CHOLHDL, LDLDIRECT in the last 72 hours. Thyroid Function Tests: No results for input(s): TSH, T4TOTAL, FREET4, T3FREE, THYROIDAB in the last 72 hours. Anemia Panel: No results for input(s): VITAMINB12, FOLATE, FERRITIN, TIBC, IRON, RETICCTPCT in the last 72 hours. Urine analysis:     Component Value Date/Time   COLORURINE YELLOW 01/08/2022 2230   APPEARANCEUR CLEAR 01/08/2022 2230   LABSPEC <1.005 (L) 01/08/2022 2230   PHURINE 5.5 01/08/2022 2230   GLUCOSEU NEGATIVE 01/08/2022 2230   HGBUR LARGE (A) 01/08/2022 2230   BILIRUBINUR NEGATIVE 01/08/2022 2230   KETONESUR NEGATIVE 01/08/2022 2230   PROTEINUR 30 (A) 01/08/2022 2230   UROBILINOGEN 0.2 11/27/2012 1120   NITRITE NEGATIVE 01/08/2022 2230   LEUKOCYTESUR NEGATIVE 01/08/2022 2230   No results found for this or any previous visit (from the past 240 hour(s)).   ECHOCARDIOGRAM LIMITED  Result Date: 05/25/2022    ECHOCARDIOGRAM LIMITED REPORT   Patient Name:   Veronica Young Date of Exam: 05/25/2022 Medical Rec #:  [id number]      Height:       65.0 in Accession #:    [id number]     Weight:       115.7 lb Date of Birth:  1933/05/17     BSA:          1.568 m Patient Age:    76 years       BP:           110/57 mmHg Patient Gender: F              HR:           84 bpm. Exam Location:  Inpatient Procedure: Limited Echo, Cardiac Doppler and Color Doppler Indications:    I31.3 Pericardial effusion (noninflammatory)  History:        Patient has prior history of Echocardiogram examinations, most                 recent 05/21/2022. CHF, CAD, Pacemaker and Abnormal ECG,                 Signs/Symptoms:Dyspnea and Shortness of Breath; Risk                 Factors:Hypertension and Diabetes.  Sonographer:    Roseanna Rainbow RDCS Referring Phys: [id number] [place]  1. Left ventricular ejection fraction, by estimation, is 65 to 70%. The left ventricle has normal function. There is severe left ventricular hypertrophy.  2. Left atrial size was severely dilated.  3. Right atrial size was severely dilated.  4. Moderate pleural effusion.  5. Tricuspid valve regurgitation is moderate to severe.  6. Aortic valve regurgitation is not visualized.  7. There is severely elevated pulmonary artery systolic pressure. Comparison(s): No significant change  from  prior study. Conclusion(s)/Recommendation(s): No significant pericardial effusion. FINDINGS  Left Ventricle: Left ventricular ejection fraction, by estimation, is 65 to 70%. The left ventricle has normal function. There is severe left ventricular hypertrophy. Right Ventricle: There is severely elevated pulmonary artery systolic pressure. The tricuspid regurgitant velocity is 4.52 m/s, and with an assumed right atrial pressure of 15 mmHg, the estimated right ventricular systolic pressure is 77.4 mmHg. Left Atrium: Left atrial size was severely dilated. Right Atrium: Right atrial size was severely dilated. Pericardium: Trivial pericardial effusion is present. Tricuspid Valve: Tricuspid valve regurgitation is moderate to severe. Aortic Valve: Aortic valve regurgitation is not visualized. Aortic valve peak gradient measures 83.2 mmHg. Additional Comments: A device lead is visualized. There is a moderate pleural effusion. LEFT VENTRICLE PLAX 2D LVIDd:         3.30 cm LVIDs:         2.90 cm LV PW:         1.40 cm LV IVS:        1.60 cm LVOT diam:     1.90 cm LVOT Area:     2.84 cm  IVC IVC diam: 2.70 cm AORTIC VALVE AV Vmax:      456.00 cm/s AV Peak Grad: 83.2 mmHg TRICUSPID VALVE TR Peak grad:   81.7 mmHg TR Vmax:        452.00 cm/s  SHUNTS Systemic Diam: 1.90 cm Phineas Inches Electronically signed by Phineas Inches Signature Date/Time: 05/25/2022/1:27:55 PM    Final     Family Communication: None at bedside  Disposition: Status is: Inpatient Remains inpatient appropriate because: Requires thoracentesis to facilitate safe discharge home Planned Discharge Destination: Home      Patrecia Pour, MD 05/26/2022 5:41 PM Page by [email or web address]

## 2022-05-27 ENCOUNTER — Telehealth: Payer: Self-pay | Admitting: Cardiology

## 2022-05-27 DIAGNOSIS — J9621 Acute and chronic respiratory failure with hypoxia: Secondary | ICD-10-CM | POA: Diagnosis not present

## 2022-05-27 DIAGNOSIS — G9341 Metabolic encephalopathy: Secondary | ICD-10-CM | POA: Diagnosis not present

## 2022-05-27 DIAGNOSIS — N179 Acute kidney failure, unspecified: Secondary | ICD-10-CM | POA: Diagnosis not present

## 2022-05-27 DIAGNOSIS — I5033 Acute on chronic diastolic (congestive) heart failure: Secondary | ICD-10-CM | POA: Diagnosis not present

## 2022-05-27 LAB — COMPREHENSIVE METABOLIC PANEL
ALT: 17 U/L (ref 0–44)
AST: 26 U/L (ref 15–41)
Albumin: 2.5 g/dL — ABNORMAL LOW (ref 3.5–5.0)
Alkaline Phosphatase: 162 U/L — ABNORMAL HIGH (ref 38–126)
Anion gap: 8 (ref 5–15)
BUN: 31 mg/dL — ABNORMAL HIGH (ref 8–23)
CO2: 31 mmol/L (ref 22–32)
Calcium: 8.6 mg/dL — ABNORMAL LOW (ref 8.9–10.3)
Chloride: 99 mmol/L (ref 98–111)
Creatinine, Ser: 1.27 mg/dL — ABNORMAL HIGH (ref 0.44–1.00)
GFR, Estimated: 41 mL/min — ABNORMAL LOW (ref >60)
Glucose, Bld: 92 mg/dL (ref 70–99)
Potassium: 3.4 mmol/L — ABNORMAL LOW (ref 3.5–5.1)
Sodium: 138 mmol/L (ref 135–145)
Total Bilirubin: 1.1 mg/dL (ref 0.3–1.2)
Total Protein: 6.2 g/dL — ABNORMAL LOW (ref 6.5–8.1)

## 2022-05-27 LAB — CBC
HCT: 35.5 % — ABNORMAL LOW (ref 36.0–46.0)
Hemoglobin: 11.5 g/dL — ABNORMAL LOW (ref 12.0–15.0)
MCH: 28.3 pg (ref 26.0–34.0)
MCHC: 32.4 g/dL (ref 30.0–36.0)
MCV: 87.4 fL (ref 80.0–100.0)
Platelets: 159 10*3/uL (ref 150–400)
RBC: 4.06 MIL/uL (ref 3.87–5.11)
RDW: 17.4 % — ABNORMAL HIGH (ref 11.5–15.5)
WBC: 5.2 10*3/uL (ref 4.0–10.5)
nRBC: 0 % (ref 0.0–0.2)

## 2022-05-27 LAB — LACTATE DEHYDROGENASE: LDH: 190 U/L (ref 98–192)

## 2022-05-27 MED ORDER — FUROSEMIDE 40 MG PO TABS: 40.0000 mg | ORAL_TABLET | Freq: Two times a day (BID) | ORAL | 0 refills | Status: AC

## 2022-05-27 MED ORDER — POTASSIUM CHLORIDE CRYS ER 20 MEQ PO TBCR
40.0000 meq | EXTENDED_RELEASE_TABLET | Freq: Once | ORAL | Status: AC
  Administered 2022-05-27: 40 meq via ORAL
  Filled 2022-05-27: qty 2

## 2022-05-27 NOTE — Discharge Summary (Signed)
Physician Discharge Summary   Patient: Veronica Young MRN: [id number] DOB: [birth year]  Admit date:     05/20/2022  Discharge date: 05/27/22  Discharge Physician: Patrecia Pour   PCP: Katherina Mires, MD   Recommendations at discharge:  Follow up with PCP's office within the next week for repeat exam, monitoring metabolic panel, and chest xray. Patient discharged after readmission for acute on chronic hypoxic respiratory failure due to acute on chronic HFpEF with pleural effusions. Pt/daughter opted against thoracentesis during hospitalization. Lasix 40mg  po BID at discharge (was taking once daily dosing) Follow up with palliative care services as an outpatient.  Discharge Diagnoses: Principal Problem:   Acute on chronic diastolic CHF (congestive heart failure) (HCC) Active Problems:   Chest pain   Acute on chronic respiratory failure with hypoxia (HCC)   Congenital deafness   Acute kidney injury superimposed on chronic kidney disease (HCC)   Anxiety   T2DM (type 2 diabetes mellitus) (HCC)   Protein-calorie malnutrition, severe   Hypothyroidism   Acute metabolic encephalopathy  Hospital Course: Veronica Young was admitted to the hospital with the working diagnosis of chest pain, complicated with congested heart failure, in the setting of pulmonary hypertension. Acute on chronic hypoxemic respiratory failure.   88 yo female with congential deafness, CKD stage 3b, diastolic heart failure, atrial fibrillation, sick sinus syndrome sp PPM, coronary artery disease, hypertension, dyslipidemia and T2DM who presented with chest pain. Reported constant chest pain, upper back and upper chest, not exertion related and worse with vomiting. In the Ed her 02 saturation was 84% on 2 L/min per [place], her blood pressure 104/55, HR 80, RR 26, lungs with no wheezing, heart with S1 and S2 present with no gallops, abdomen not distended and positive lower extremity edema.   Na 135, K 3,9, Cl 93, bicarbonate 31,  glucose 122, bun 38 cr 1,53 BNP 690  Wbc 8.9 hgb 12.7 plt 111  TSH 0,296   Chest radiograph with cardiomegaly, with mild hilar vascular congestion, no infiltrates, pacemaker with one right atrial and ventricular lead.   CT chest and abdomen with no evidence of dissection or aneurysm, small pericardial effusion, small bilateral pleural effusions, no acute changes in the abdomen.  EKG 89 right axis deviation, normal qtc, atrial fibrillation rhythm, with no significant ST segment changes, negative T wave in V5 and V6.   Patient was placed on furosemide for diuresis.  Worsening renal function and worsening oxygenation.  05/31 acute worsening in mental status, CT head with no signs of acute CVA.  06/01 follow up head CT negative for acute CVA. Neurology recommendations to continue statin and anticoagulation with apixaban.   06/02 continue to have high oxygen requirements, on 6 L per [facility] with 02 saturation 88%.    Assessment and Plan: Acute on chronic hypoxemic respiratory failure: Due to acute on chronic HFpEF with pulmonary edema and ongoing pleural effusions as well as progressive pulmonary HTN, possibly related to RA.  - Weaned oxygen near home dose, stable on 4L O2 with exertion on day of discharge.  - After extensive discussions of risks and benefits associated with thoracentesis, patient and daughter decide against it at this time. Note she is at very high risk of readmission regardless. Palliative care and home health therapies will follow after discharge. Close follow up stressed.  - Referral to pulmonary hypertension clinic as outpatient.  - Overall limited life expectancy with advanced age, frequent hospitalizations, multiorgan failure. Palliative care conversations ongoing, currently in mode of aggressive  interventions though is DNR. Will follow up with palliative care after discharge.    Acute on chronic HFpEF and cor pulmonale: LVEF >75% with severe concentric LVH, moderate  reduction in RV systolic function, moderate enlargement in RV cavity, severe BAE, trivial pericardial effusion. Repeat echo showed pleural, not pericardial effusion. - Continue lasix 40mg  po BID (was taking both 40mg  tabs in the morning and none at PM) - Monitor daily weights (53kg > 50.9 > 49kg at discharge) - No longer requiring midodrine BP support.    Persistent AFib, SSS s/p medtronic PPM:  - Continue eliquis, rate is controlled, will continue metoprolol.  - F/u with Dr. Lovena Le as outpatient.    Hyponatremia: Resolved.    Chest pain: Not suggestive of ACS.  - Continue acetaminophen    Congenital deafness:  - ASL interpretor used throughout encounters.   AKI on stage IIIb CKD: Cr is stable with diuresis.  - Monitor at follow up   T2DM: HbA1c 6.1%.    Anxiety, depression: Quiescent.  - Continue escitalopram, avoid benzodiazepines as this caused sedation 5/31.     Protein-calorie malnutrition, severe - Continue with nutritional supplements.    Hypothyroidism: With mildly low TSH, mildly elevated free T3 and low free T3. Unclear etiology, though changes are mild and no attributable symptoms, does not appear clinically hyperthyroid.  - Continue synthroid at current dose and recheck TFTs at follow up.    Acute metabolic encephalopathy: Improved.    Patient fell 05/31. No head trauma. Her neurologic deficit of right facial droop is chronic, follow up head CT with no CVA. - Plan to continue anticoagulation with apixaban and statin therapy. - Fall precautions   Thrombocytopenia:  - Monitor in AM.   Hyperbilirubinemia: Mild, improving.    Glaucoma:  - Continue gtt's  Consultants: Cardiology, palliative care Procedures performed: None  Disposition: Home Diet recommendation:  Discharge Diet Orders (From admission, onward)     Start     Ordered   05/27/22 0000  Diet - low sodium heart healthy        05/27/22 1214           Cardiac diet DISCHARGE  MEDICATION: Allergies as of 05/27/2022       Reactions   Amlodipine Nausea Only      Benicar [olmesartan Medoxomil] Nausea Only        Medication List     TAKE these medications    acetaminophen 500 MG tablet Commonly known as: TYLENOL Take 1,000 mg by mouth every 6 (six) hours as needed for moderate pain.   atropine 1 % ophthalmic solution Place 1 drop into the left eye at bedtime.   brimonidine 0.2 % ophthalmic solution Commonly known as: ALPHAGAN Place 1 drop into both eyes 2 (two) times daily.   calcium-vitamin D 500-200 MG-UNIT tablet Commonly known as: OSCAL WITH D Take 1 tablet by mouth daily with breakfast.   cholecalciferol 1000 units tablet Commonly known as: VITAMIN D Take 1,000 Units by mouth daily.   Eliquis 2.5 MG Tabs tablet Generic drug: apixaban TAKE 1 TABLET TWICE DAILY   escitalopram 10 MG tablet Commonly known as: LEXAPRO Take 10 mg by mouth daily.   feeding supplement Liqd Take 237 mLs by mouth 3 (three) times daily between meals. What changed: when to take this   furosemide 40 MG tablet Commonly known as: LASIX Take 1 tablet (40 mg total) by mouth 2 (two) times daily. What changed:  how much to take when to take this  gabapentin 100 MG capsule Commonly known as: NEURONTIN Take 100 mg by mouth at bedtime.   latanoprost 0.005 % ophthalmic solution Commonly known as: XALATAN Place 1 drop into both eyes in the morning and at bedtime.   levothyroxine 175 MCG tablet Commonly known as: SYNTHROID Take 175 mcg by mouth daily before breakfast.   metoprolol tartrate 25 MG tablet Commonly known as: LOPRESSOR TAKE 1 TABLET (25 MG TOTAL) BY MOUTH 2 (TWO) TIMES DAILY. What changed: how much to take   multivitamin with minerals Tabs tablet Take 1 tablet by mouth daily.   OXYGEN Inhale into the lungs. 2 liters as needed   potassium chloride SA 20 MEQ tablet Commonly known as: KLOR-CON M Take 1 tablet (20 mEq total) by mouth daily.    Rhopressa 0.02 % Soln Generic drug: Netarsudil Dimesylate Place 1 drop into the left eye at bedtime.   triamcinolone ointment 0.5 % Commonly known as: KENALOG Apply 1 application. topically 2 (two) times daily.               Durable Medical Equipment  (From admission, onward)           Start     Ordered   05/27/22 0000  For home use only DME oxygen       Question Answer Comment  Length of Need 6 Months   Mode or (Route) Nasal cannula   Liters per Minute 4   Frequency Continuous (stationary and portable oxygen unit needed)   Oxygen delivery system Gas      05/27/22 [place] Follow up.   Specialty: Home Health Services Why: Physical Therapy/Occupational Therapy-office to call with visit times Contact information: [place] [address] [phone]         [facility], [place] Follow up on 06/18/2022.   Specialties: Cardiology, Radiology Why: Cardiology Hospital Follow-up on 06/18/2022 at 1:30 PM. Contact information: [address] [place] [address] [phone]         [facility] Follow up.   Why: Oxygen Contact information: [place] [address] [phone]         [facility] Follow up.   Specialty: PALLIATIVE CARE Why: Outpatient palliative services-office to call with a visit time. Contact information: [place] [address] [phone]               Discharge Exam: Danley Danker Weights   05/24/22 0314 05/26/22 0300 05/27/22 0425  Weight: 52.5 kg 50.9 kg 49.8 kg  BP 119/83 (BP Location: Right Arm)   Pulse (!) 110   Temp 98.2 F (36.8 C) (Oral)   Resp 18   Ht 5\' 5"  (1.651 m)   Wt 49.8 kg   SpO2 98%   BMI 18.27 kg/m   Frail, elderly, very pleasant, deaf female seen walking halls and later eating breakfast Nonlabored, diminished without crackles Irreg  irreg with no dependent edema or JVD  Condition at discharge: stable  The results of significant diagnostics from this hospitalization (including imaging, microbiology, ancillary and laboratory) are listed below for reference.   Imaging Studies: CT HEAD WO CONTRAST (5MM)  Result Date: 05/24/2022 CLINICAL DATA:  88 year old female with neurologic deficit. Stroke versus encephalopathy from CHF exacerbation. But MRI incompatible cardiac pacemaker. History of atrial fibrillation on Eliquis EXAM: CT HEAD WITHOUT CONTRAST TECHNIQUE:  Contiguous axial images were obtained from the base of the skull through the vertex without intravenous contrast. RADIATION DOSE REDUCTION: This exam was performed according to the departmental dose-optimization program which includes automated exposure control, adjustment of the mA and/or kV according to patient size and/or use of iterative reconstruction technique. COMPARISON:  Head CT without contrast 05/22/2022 and earlier. FINDINGS: Brain: Fairly extensive but chronic bilateral basal ganglia vascular calcifications, present in 2014. No midline shift, mass effect, or evidence of intracranial mass lesion. No ventriculomegaly. Patchy asymmetric bilateral cerebral white matter hypodensity appears stable from 05/22/2022. Deep gray matter nuclei and brainstem appear relatively spared. No cortically based acute infarct identified. No acute intracranial hemorrhage identified. Vascular: Extensive Calcified atherosclerosis at the skull base. No suspicious intracranial vascular hyperdensity. Skull: Hyperostosis of the calvarium. No acute osseous abnormality identified. Sinuses/Orbits: Visualized paranasal sinuses and mastoids are stable and well aerated. Other: No acute orbit or scalp soft tissue finding. IMPRESSION: Stable non contrast CT appearance of the brain. No evolving infarct or acute intracranial hemorrhage identified. Electronically Signed   By: Genevie Ann M.D.   On: 05/24/2022 05:05    CT HEAD WO CONTRAST (5MM)  Result Date: 05/22/2022 CLINICAL DATA:  Initial evaluation for neuro deficit, stroke suspected. EXAM: CT HEAD WITHOUT CONTRAST TECHNIQUE: Contiguous axial images were obtained from the base of the skull through the vertex without intravenous contrast. RADIATION DOSE REDUCTION: This exam was performed according to the departmental dose-optimization program which includes automated exposure control, adjustment of the mA and/or kV according to patient size and/or use of iterative reconstruction technique. COMPARISON:  Prior CT from 09/19/2013. FINDINGS: Brain: Generalized age-related cerebral atrophy with mild chronic small vessel ischemic disease. No acute intracranial hemorrhage. No acute large vessel territory infarct. No mass lesion, midline shift or mass effect. No hydrocephalus or extra-axial fluid collection. Vascular: No hyperdense vessel. Calcified atherosclerosis present about the skull base. Skull: No acute scalp soft tissue abnormality. Small nodular calcification at the right parieto-occipital scalp noted, nonspecific, but likely benign. Calvarium intact. Sinuses/Orbits: Globes and orbital soft tissues demonstrate no acute finding. Paranasal sinuses mastoid air cells are largely clear. Other: 10. IMPRESSION: 1. No acute intracranial abnormality. No hemorrhage or large vessel territory infarct by CT. 2. Generalized age-related cerebral atrophy with mild chronic small vessel ischemic disease. Electronically Signed   By: Jeannine Boga M.D.   On: 05/22/2022 22:22   CT CHEST WO CONTRAST  Result Date: 05/24/2022 CLINICAL DATA:  Chronic dyspnea EXAM: CT CHEST WITHOUT CONTRAST TECHNIQUE: Multidetector CT imaging of the chest was performed following the standard protocol without IV contrast. RADIATION DOSE REDUCTION: This exam was performed according to the departmental dose-optimization program which includes automated exposure control, adjustment of the mA and/or kV  according to patient size and/or use of iterative reconstruction technique. COMPARISON:  05/20/2022 FINDINGS: Cardiovascular: Coronary artery calcifications are seen. Evaluation of vascular structures is limited without contrast. Pacer leads are noted in place. Pacemaker battery is seen in the left infraclavicular region. There is interval increase in size of small pericardial effusion. Mediastinum/Nodes: No new significant lymphadenopathy seen. Thyroid is smaller than usual in size. Lungs/Pleura: There are faint ground-glass densities in the parahilar regions and lower lung fields. Moderate to large bilateral pleural effusions are seen, more so on the left side. There is interval increase in bilateral pleural effusions. There are patchy infiltrates in both lower lung fields, more so on the left side suggesting atelectasis/pneumonia. There is no pneumothorax. Upper Abdomen: Unremarkable. Musculoskeletal: There are patchy foci  of sclerosis in the skeletal structures. IMPRESSION: Cardiomegaly.  Coronary artery calcifications are seen. There are ground-glass densities in the parahilar regions suggesting possible pulmonary edema. Moderate large bilateral pleural effusions with significant interval increase in size. There is increase in size pericardial effusion. There are new patchy infiltrates in both lower lung fields, more so on the left side suggesting atelectasis/pneumonia. Other findings as described in the body of the report. Electronically Signed   By: Elmer Picker M.D.   On: 05/24/2022 16:35   DG CHEST PORT 1 VIEW  Result Date: 05/23/2022 CLINICAL DATA:  Acute and chronic respiratory failure with hypoxia. EXAM: PORTABLE CHEST 1 VIEW COMPARISON:  05/20/2022. FINDINGS: 4:26 a.m. 05/23/2022. Left chest pacing system and wire insertions are unaltered. The heart moderately enlarged with persistent mild perihilar vascular congestion and lower zonal interstitial edema with small but interval increased  pleural effusions. There are stable hazy opacities in the lower lung fields consistent with ground-glass edema, pneumonitis or combination. There is increased band consolidation in the right lower lung field today which could be atelectasis or a small pneumonia. The mid and upper lungs clear with COPD change. In all other respects no further changes. Stable aortic tortuosity with ectasia. IMPRESSION: 1. Increased opacity in the right lower lung field consistent with atelectasis or focal pneumonia. 2. Basilar interstitial edema and small interval increased pleural effusions with hazy overlying opacities consistent with edema or pneumonia. 3. Cardiomegaly. Electronically Signed   By: Telford Nab M.D.   On: 05/23/2022 06:28   DG Chest Portable 1 View  Result Date: 05/20/2022 CLINICAL DATA:  Pleuritic sharp pain to back. EXAM: PORTABLE CHEST 1 VIEW pleuritic sharp stabbing pain to back. COMPARISON:  AP chest 01/13/2022 and 11/13/2020; CT chest 11/14/2020 FINDINGS: Left chest wall cardiac pacer is seen with leads overlying right atrium and right ventricle. Cardiac silhouette is again mildly to moderately enlarged. Mediastinal contours are within normal limits. There is unchanged mild bilateral interstitial thickening, greatest within the lung bases. Cystic lucencies are again seen within the upper lungs suggesting cystic emphysematous change. No definite pleural effusion. No pneumothorax. Mild levocurvature of the thoracic spine with mild multilevel degenerative disc changes. IMPRESSION: No significant change from 01/13/2022. Cardiomegaly with mild interstitial pulmonary edema. Electronically Signed   By: Yvonne Kendall M.D.   On: 05/20/2022 18:15   ECHOCARDIOGRAM COMPLETE  Result Date: 05/21/2022    ECHOCARDIOGRAM REPORT   Patient Name:   Veronica Young Date of Exam: 05/21/2022 Medical Rec #:  [id number]      Height:       65.0 in Accession #:    [id number]     Weight:       113.8 lb Date of Birth:  [birth year]      BSA:          1.557 m Patient Age:    77 years       BP:           115/64 mmHg Patient Gender: F              HR:           72 bpm. Exam Location:  Inpatient Procedure: 2D Echo, Cardiac Doppler and Color Doppler Indications:    CHF  History:        Patient has prior history of Echocardiogram examinations, most                 recent 01/14/2022. CHF, Pacemaker; Risk Factors:Diabetes and  Dyslipidemia.  Sonographer:    Luisa Hart RDCS Referring Phys: [id number] Shela Leff  Sonographer Comments: 07/26/2014 pacemaker IMPRESSIONS  1. Compared to echo from Jan 2023, no significant change in LVEF and RVEF.  2. Left ventricular ejection fraction, by estimation, is >75%. The left ventricle has hyperdynamic function. The left ventricle has no regional wall motion abnormalities. There is severe concentric left ventricular hypertrophy. Left ventricular diastolic parameters are indeterminate.  3. Right ventricular systolic function is moderately reduced. The right ventricular size is moderately enlarged.  4. Left atrial size was severely dilated.  5. Right atrial size was severely dilated.  6. The mitral valve is normal in structure. Mild mitral valve regurgitation.  7. The aortic valve is tricuspid. Aortic valve regurgitation is not visualized. Aortic valve sclerosis is present, with no evidence of aortic valve stenosis.  8. The inferior vena cava is dilated in size with <50% respiratory variability, suggesting right atrial pressure of 15 mmHg. FINDINGS  Left Ventricle: Left ventricular ejection fraction, by estimation, is >75%. The left ventricle has hyperdynamic function. The left ventricle has no regional wall motion abnormalities. The left ventricular internal cavity size was small. There is severe concentric left ventricular hypertrophy. Left ventricular diastolic parameters are indeterminate. Right Ventricle: The right ventricular size is moderately enlarged. Right vetricular wall thickness was not  assessed. Right ventricular systolic function is moderately reduced. Left Atrium: Left atrial size was severely dilated. Right Atrium: Right atrial size was severely dilated. Pericardium: Trivial pericardial effusion is present. Mitral Valve: The mitral valve is normal in structure. Mild mitral valve regurgitation. Tricuspid Valve: The tricuspid valve is normal in structure. Tricuspid valve regurgitation is mild. Aortic Valve: The aortic valve is tricuspid. Aortic valve regurgitation is not visualized. Aortic valve sclerosis is present, with no evidence of aortic valve stenosis. Aortic valve mean gradient measures 2.3 mmHg. Aortic valve peak gradient measures 4.4  mmHg. Aortic valve area, by VTI measures 3.46 cm. Pulmonic Valve: The pulmonic valve was normal in structure. Pulmonic valve regurgitation is not visualized. Aorta: The aortic root and ascending aorta are structurally normal, with no evidence of dilitation. Venous: The inferior vena cava is dilated in size with less than 50% respiratory variability, suggesting right atrial pressure of 15 mmHg. IAS/Shunts: No atrial level shunt detected by color flow Doppler. Additional Comments: A device lead is visualized.  LEFT VENTRICLE PLAX 2D LVIDd:         3.20 cm     Diastology LVIDs:         1.80 cm     LV e' medial:    5.56 cm/s LV PW:         1.50 cm     LV E/e' medial:  17.2 LV IVS:        1.80 cm     LV e' lateral:   7.37 cm/s LVOT diam:     2.30 cm     LV E/e' lateral: 13.0 LV SV:         65 LV SV Index:   42 LVOT Area:     4.15 cm  LV Volumes (MOD) LV vol d, MOD A2C: 23.7 ml LV vol d, MOD A4C: 24.9 ml LV vol s, MOD A2C: 11.9 ml LV vol s, MOD A4C: 10.1 ml LV SV MOD A2C:     11.8 ml LV SV MOD A4C:     24.9 ml LV SV MOD BP:      13.6 ml RIGHT VENTRICLE RV Basal diam:  3.50  cm RV Mid diam:    1.80 cm RV S prime:     7.74 cm/s TAPSE (M-mode): 1.3 cm LEFT ATRIUM             Index        RIGHT ATRIUM           Index LA Vol (A2C):   40.5 ml 26.02 ml/m  RA Area:      22.20 cm LA Vol (A4C):   66.2 ml 42.53 ml/m  RA Volume:   67.20 ml  43.17 ml/m LA Biplane Vol: 55.2 ml 35.46 ml/m  AORTIC VALVE                    PULMONIC VALVE AV Area (Vmax):    3.34 cm     PV Vmax:       0.74 m/s AV Area (Vmean):   3.24 cm     PV Vmean:      51.575 cm/s AV Area (VTI):     3.46 cm     PV VTI:        0.134 m AV Vmax:           104.67 cm/s  PV Peak grad:  2.2 mmHg AV Vmean:          71.233 cm/s  PV Mean grad:  1.0 mmHg AV VTI:            0.187 m AV Peak Grad:      4.4 mmHg AV Mean Grad:      2.3 mmHg LVOT Vmax:         84.20 cm/s LVOT Vmean:        55.500 cm/s LVOT VTI:          0.156 m LVOT/AV VTI ratio: 0.83  AORTA Ao Root diam: 2.90 cm Ao Asc diam:  3.50 cm MV E velocity: 95.67 cm/s  TRICUSPID VALVE                            TR Peak grad:   68.9 mmHg                            TR Vmax:        415.00 cm/s                             SHUNTS                            Systemic VTI:  0.16 m                            Systemic Diam: 2.30 cm Dorris Carnes MD Electronically signed by Dorris Carnes MD Signature Date/Time: 05/21/2022/10:53:17 AM    Final    CT Angio Chest/Abd/Pel for Dissection W and/or Wo Contrast  Result Date: 05/20/2022 CLINICAL DATA:  Chest and back pain. EXAM: CT ANGIOGRAPHY CHEST, ABDOMEN AND PELVIS TECHNIQUE: Non-contrast CT of the chest was initially obtained. Multidetector CT imaging through the chest, abdomen and pelvis was performed using the standard protocol during bolus administration of intravenous contrast. Multiplanar reconstructed images and MIPs were obtained and reviewed to evaluate the vascular anatomy. RADIATION DOSE REDUCTION: This exam was performed according to the departmental dose-optimization program which includes automated  exposure control, adjustment of the mA and/or kV according to patient size and/or use of iterative reconstruction technique. CONTRAST:  49mL OMNIPAQUE IOHEXOL 350 MG/ML SOLN COMPARISON:  CT chest 11/14/2020 FINDINGS: CTA CHEST  FINDINGS Cardiovascular: Heart is moderately enlarged, unchanged. There is a new small pericardial effusion. There is adequate opacification of the thoracic aorta. There is no evidence for aortic dissection or aneurysm. There is calcified atherosclerotic disease throughout the aorta. Left-sided pacemaker is present with leads in the right atrium and right ventricle. Mediastinum/Nodes: No enlarged mediastinal, hilar, or axillary lymph nodes. Thyroid gland, trachea, and esophagus demonstrate no significant findings. Lungs/Pleura: There are trace bilateral pleural effusions scratch at trace right and small left pleural effusions are present. There is a small amount of atelectasis in the bilateral lower lobes, right middle lobe and lingula. There is a calcified granuloma in the right lung apex, unchanged. Pneumothorax. Musculoskeletal: No acute fracture. Review of the MIP images confirms the above findings. CTA ABDOMEN AND PELVIS FINDINGS VASCULAR Aorta: Normal caliber aorta without aneurysm, dissection, vasculitis or significant stenosis. There are atherosclerotic calcifications of the aorta. Celiac: Patent without evidence of aneurysm, dissection, vasculitis or significant stenosis. SMA: Patent without evidence of aneurysm, dissection, vasculitis or significant stenosis. Renals: There is moderate stenosis at the origin of the right renal artery. Left renal artery is within normal limits. IMA: Patent without evidence of aneurysm, dissection, vasculitis or significant stenosis. Inflow: Patent without evidence of aneurysm, dissection, vasculitis or significant stenosis. Veins: No obvious venous abnormality within the limitations of this arterial phase study. Review of the MIP images confirms the above findings. NON-VASCULAR Hepatobiliary: No focal liver abnormality is seen. Status post cholecystectomy. No biliary dilatation. Pancreas: Unremarkable. No pancreatic ductal dilatation or surrounding inflammatory changes.  Spleen: Normal in size without focal abnormality. Adrenals/Urinary Tract: Renal cortical hypodensities are seen in both kidneys which are too small to characterize, likely cysts. There is no hydronephrosis or perinephric fluid. The adrenal glands and bladder are within normal limits. Stomach/Bowel: Stomach is within normal limits. No evidence of bowel wall thickening, distention, or inflammatory changes. Appendix is not seen. There is sigmoid colon diverticulosis without evidence for acute diverticulitis. Lymphatic: No enlarged lymph nodes are seen. Reproductive: Uterus and bilateral adnexa are unremarkable. Other: No abdominal wall hernia or abnormality. Small amount of free fluid in the pelvis. Musculoskeletal: There is scoliosis and degenerative change throughout the lumbar spine. Review of the MIP images confirms the above findings. IMPRESSION: 1. No evidence for aortic dissection or aneurysm. 2. Moderate cardiomegaly with small pericardial effusion. 3. Small bilateral pleural effusions. 4. Small amount of free fluid in the pelvis of uncertain etiology. 5. Sigmoid colon diverticulosis. 6.  Aortic Atherosclerosis (ICD10-I70.0). Electronically Signed   By: Ronney Asters M.D.   On: 05/20/2022 19:37   VAS US CAROTID  Result Date: 05/23/2022 Carotid Arterial Duplex Study Patient Name:  Veronica Young  Date of Exam:   05/23/2022 Medical Rec #: [id number]       Accession #:    [id number] Date of Birth: [birth year]      Patient Gender: F Patient Age:   63 years Exam Location:  [facility] Procedure:      VAS US CAROTID Referring Phys: Cornelius Moras XU --------------------------------------------------------------------------------  Indications:       Bilateral bruits. Risk Factors:      Hypertension, hyperlipidemia, Diabetes. Other Factors:     Congenital deafness, CKD IIIb, atrial fibrillation,  Pcemaker. Comparison Study:  Prior normal carotid duplex on file from 10/13/2009 Performing Technologist:  Sharion Dove RVS  Examination Guidelines: A complete evaluation includes B-mode imaging, spectral Doppler, color Doppler, and power Doppler as needed of all accessible portions of each vessel. Bilateral testing is considered an integral part of a complete examination. Limited examinations for reoccurring indications may be performed as noted.  Right Carotid Findings: +----------+--------+--------+--------+------------------+------------------+           PSV cm/sEDV cm/sStenosisPlaque DescriptionComments           +----------+--------+--------+--------+------------------+------------------+ CCA Prox  109     14                                intimal thickening +----------+--------+--------+--------+------------------+------------------+ CCA Distal72      11                                intimal thickening +----------+--------+--------+--------+------------------+------------------+ ICA Prox  66      16              calcific                             +----------+--------+--------+--------+------------------+------------------+ ICA Distal76      14                                                   +----------+--------+--------+--------+------------------+------------------+ ECA       110     7                                                    +----------+--------+--------+--------+------------------+------------------+ +----------+--------+-------+--------+-------------------+           PSV cm/sEDV cmsDescribeArm Pressure (mmHG) +----------+--------+-------+--------+-------------------+ Subclavian105                                        +----------+--------+-------+--------+-------------------+ +---------+--------+--+--------+--+ VertebralPSV cm/s67EDV cm/s16 +---------+--------+--+--------+--+  Left Carotid Findings: +----------+--------+--------+--------+------------------+------------------+           PSV cm/sEDV cm/sStenosisPlaque  DescriptionComments           +----------+--------+--------+--------+------------------+------------------+ CCA Prox  71      12                                intimal thickening +----------+--------+--------+--------+------------------+------------------+ CCA Distal77      14                                intimal thickening +----------+--------+--------+--------+------------------+------------------+ ICA Prox  45      10              calcific                             +----------+--------+--------+--------+------------------+------------------+ ICA Distal41      7                                                    +----------+--------+--------+--------+------------------+------------------+  ECA       84      2                                                    +----------+--------+--------+--------+------------------+------------------+ +----------+--------+--------+--------+-------------------+           PSV cm/sEDV cm/sDescribeArm Pressure (mmHG) +----------+--------+--------+--------+-------------------+ [id number]                                         +----------+--------+--------+--------+-------------------+ +---------+--------+--+--------+--+ VertebralPSV cm/s67EDV cm/s14 +---------+--------+--+--------+--+   Summary: Right Carotid: Velocities in the right ICA are consistent with a 1-39% stenosis. Left Carotid: Velocities in the left ICA are consistent with a 1-39% stenosis. Vertebrals:  Bilateral vertebral arteries demonstrate antegrade flow. Subclavians: Normal flow hemodynamics were seen in bilateral subclavian              arteries. *See table(s) above for measurements and observations.  Electronically signed by Orlie Pollen on 05/23/2022 at 5:22:52 PM.    Final    ECHOCARDIOGRAM LIMITED  Result Date: 05/25/2022    ECHOCARDIOGRAM LIMITED REPORT   Patient Name:   Veronica Young Date of Exam: 05/25/2022 Medical Rec #:  [id number]      Height:        65.0 in Accession #:    [id number]     Weight:       115.7 lb Date of Birth:  [birth year]     BSA:          1.568 m Patient Age:    89 years       BP:           110/57 mmHg Patient Gender: F              HR:           84 bpm. Exam Location:  Inpatient Procedure: Limited Echo, Cardiac Doppler and Color Doppler Indications:    I31.3 Pericardial effusion (noninflammatory)  History:        Patient has prior history of Echocardiogram examinations, most                 recent 05/21/2022. CHF, CAD, Pacemaker and Abnormal ECG,                 Signs/Symptoms:Dyspnea and Shortness of Breath; Risk                 Factors:Hypertension and Diabetes.  Sonographer:    Roseanna Rainbow RDCS Referring Phys: [id number] [place]  1. Left ventricular ejection fraction, by estimation, is 65 to 70%. The left ventricle has normal function. There is severe left ventricular hypertrophy.  2. Left atrial size was severely dilated.  3. Right atrial size was severely dilated.  4. Moderate pleural effusion.  5. Tricuspid valve regurgitation is moderate to severe.  6. Aortic valve regurgitation is not visualized.  7. There is severely elevated pulmonary artery systolic pressure. Comparison(s): No significant change from prior study. Conclusion(s)/Recommendation(s): No significant pericardial effusion. FINDINGS  Left Ventricle: Left ventricular ejection fraction, by estimation, is 65 to 70%. The left ventricle has normal function. There is severe left ventricular hypertrophy. Right Ventricle: There is severely elevated pulmonary artery systolic pressure. The tricuspid regurgitant velocity is 4.52 m/s, and  with an assumed right atrial pressure of 15 mmHg, the estimated right ventricular systolic pressure is 09.3 mmHg. Left Atrium: Left atrial size was severely dilated. Right Atrium: Right atrial size was severely dilated. Pericardium: Trivial pericardial effusion is present. Tricuspid Valve: Tricuspid valve regurgitation is moderate to  severe. Aortic Valve: Aortic valve regurgitation is not visualized. Aortic valve peak gradient measures 83.2 mmHg. Additional Comments: A device lead is visualized. There is a moderate pleural effusion. LEFT VENTRICLE PLAX 2D LVIDd:         3.30 cm LVIDs:         2.90 cm LV PW:         1.40 cm LV IVS:        1.60 cm LVOT diam:     1.90 cm LVOT Area:     2.84 cm  IVC IVC diam: 2.70 cm AORTIC VALVE AV Vmax:      456.00 cm/s AV Peak Grad: 83.2 mmHg TRICUSPID VALVE TR Peak grad:   81.7 mmHg TR Vmax:        452.00 cm/s  SHUNTS Systemic Diam: 1.90 cm Phineas Inches Electronically signed by Phineas Inches Signature Date/Time: 05/25/2022/1:27:55 PM    Final     Microbiology: Results for orders placed or performed during the hospital encounter of 01/13/22  Resp Panel by RT-PCR (Flu A&B, Covid) Nasopharyngeal Swab     Status: None   Collection Time: 01/13/22  7:39 PM   Specimen: Nasopharyngeal Swab; Nasopharyngeal(NP) swabs in vial transport medium  Result Value Ref Range Status   SARS Coronavirus 2 by RT PCR NEGATIVE NEGATIVE Final    Comment: (NOTE) SARS-CoV-2 target nucleic acids are NOT DETECTED.  The SARS-CoV-2 RNA is generally detectable in upper respiratory specimens during the acute phase of infection. The lowest concentration of SARS-CoV-2 viral copies this assay can detect is 138 copies/mL. A negative result does not preclude SARS-Cov-2 infection and should not be used as the sole basis for treatment or other patient management decisions. A negative result may occur with  improper specimen collection/handling, submission of specimen other than nasopharyngeal swab, presence of viral mutation(s) within the areas targeted by this assay, and inadequate number of viral copies(<138 copies/mL). A negative result must be combined with clinical observations, patient history, and epidemiological information. The expected result is Negative.  Fact Sheet for Patients:   EntrepreneurPulse.com.au  Fact Sheet for Healthcare Providers:  IncredibleEmployment.be  This test is no t yet approved or cleared by the [place] FDA and  has been authorized for detection and/or diagnosis of SARS-CoV-2 by FDA under an Emergency Use Authorization (EUA). This EUA will remain  in effect (meaning this test can be used) for the duration of the COVID-19 declaration under Section 564(b)(1) of the Act, 21 U.S.C.section 360bbb-3(b)(1), unless the authorization is terminated  or revoked sooner.       Influenza A by PCR NEGATIVE NEGATIVE Final   Influenza B by PCR NEGATIVE NEGATIVE Final    Comment: (NOTE) The Xpert Xpress SARS-CoV-2/FLU/RSV plus assay is intended as an aid in the diagnosis of influenza from Nasopharyngeal swab specimens and should not be used as a sole basis for treatment. Nasal washings and aspirates are unacceptable for Xpert Xpress SARS-CoV-2/FLU/RSV testing.  Fact Sheet for Patients: EntrepreneurPulse.com.au  Fact Sheet for Healthcare Providers: IncredibleEmployment.be  This test is not yet approved or cleared by the [place] FDA and has been authorized for detection and/or diagnosis of SARS-CoV-2 by FDA under an Emergency Use Authorization (EUA). This EUA  will remain in effect (meaning this test can be used) for the duration of the COVID-19 declaration under Section 564(b)(1) of the Act, 21 U.S.C. section 360bbb-3(b)(1), unless the authorization is terminated or revoked.  Performed at [facility], [address] [place], [place] [address]     Labs: CBC: Recent Labs  Lab 05/20/22 1742 05/21/22 0647 05/22/22 0249 05/25/22 0151 05/27/22 0133  WBC 8.9 9.1 10.1 5.8 5.2  NEUTROABS 6.9  --   --  3.7  --   HGB 12.7 12.1 11.1* 10.8* 11.5*  HCT 39.5 37.2 34.7* 33.7* 35.5*  MCV 88.4 87.3 88.3 88.0 87.4  PLT 111* 102* 115* 134* 865   Basic  Metabolic Panel: Recent Labs  Lab 05/20/22 1742 05/21/22 0647 05/23/22 0204 05/24/22 1112 05/25/22 0151 05/26/22 0240 05/27/22 0133  NA 135   < > 132* 134* 128* 136 138  K 3.9   < > 3.9 4.0 3.6 3.6 3.4*  CL 93*   < > 96* 98 91* 96* 99  CO2 31   < > 28 28 29  32 31  GLUCOSE 122*   < > 106* 182* 88 97 92  BUN 38*   < > 53* 50* 33* 37* 31*  CREATININE 1.53*   < > 1.98* 1.70* 1.24* 1.47* 1.27*  CALCIUM 9.0   < > 8.6* 8.6* 8.1* 8.3* 8.6*  MG 2.1  --  2.4  --   --   --   --    < > = values in this interval not displayed.   Liver Function Tests: Recent Labs  Lab 05/20/22 1742 05/21/22 0647 05/22/22 0249 05/27/22 0133  AST 43* 38 32 26  ALT 23 22 21 17   ALKPHOS 163* 149* 142* 162*  BILITOT 2.0* 1.9* 1.4* 1.1  PROT 7.4 6.7 6.3* 6.2*  ALBUMIN 3.6 3.1* 2.9* 2.5*   CBG: Recent Labs  Lab 05/24/22 1636 05/24/22 2103 05/25/22 0823 05/25/22 2209 05/26/22 0819  GLUCAP 83 152* 155* 126* 105*    Discharge time spent: greater than 30 minutes.  Signed: Patrecia Pour, MD [facility] 05/27/2022

## 2022-05-27 NOTE — Progress Notes (Signed)
Occupational Therapy Treatment Patient Details Name: Veronica Young MRN: [id number] DOB: 1933/01/28 Today's Date: 05/27/2022   History of present illness 88 y.o. female admitted 5/29 with chest pain and increased oxygen requirement with CHF exacerbation. 5/31 Fall after sedating medication with possible rt weakness and head CT (-). 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 Patient's session encompassed  family education with daughter as patient more lethargic after session with PT. Daughter endorses that patient often nods off at baseline after activity, therefore daughter not expressing concern with increased lethargy. Daughter and OT reviewing education with regard to energy conservation, oxygen management, and ADLs. Daughter able to provide excellent teach back throughout. OT recommendations remain appropriate with not HHOT needs, but continue to endorse HHPT. OT will continue to follow acutely.    Recommendations for follow up therapy are one component of a multi-disciplinary discharge planning process, led by the attending physician.  Recommendations may be updated based on patient status, additional functional criteria and insurance authorization.    Follow Up Recommendations  Other (comment) (defer to HHPT after conferring with daughter)    Assistance Recommended at Discharge Intermittent Supervision/Assistance  Patient can return home with the following  A little help with bathing/dressing/bathroom;Assist for transportation   Equipment Recommendations  None recommended by OT    Recommendations for Other Services      Precautions / Restrictions Precautions Precautions: Fall Restrictions Weight Bearing Restrictions: No       Mobility Bed Mobility               General bed mobility comments: Patient  up in chair upon OT arrival    Transfers                         Balance                                           ADL either performed or assessed with clinical judgement   ADL Overall ADL's : Needs assistance/impaired                                       General ADL Comments: more sleepy in session with OT, focused on family education with daughter to preserve energy for discharge home    Extremity/Trunk Assessment              Vision       Perception     Praxis      Cognition Arousal/Alertness: Awake/alert, Lethargic Behavior During Therapy: WFL for tasks assessed/performed Overall Cognitive Status: Within Functional Limits for tasks assessed                                 General Comments: more sleepy in session with OT, focused on family education with daughter to preserve energy for discharge home        Exercises      Shoulder Instructions       General Comments      Pertinent Vitals/ Pain  Pain Assessment Pain Assessment: No/denies pain  Home Living                                          Prior Functioning/Environment              Frequency  Min 2X/week        Progress Toward Goals  OT Goals(current goals can now be found in the care plan section)  Progress towards OT goals: Progressing toward goals  Acute Rehab OT Goals Patient Stated Goal: to go home OT Goal Formulation: With patient/family Time For Goal Achievement: 07/03/22 Potential to Achieve Goals: Good  Plan Discharge plan remains appropriate    Co-evaluation                 AM-PAC OT "6 Clicks" Daily Activity     Outcome Measure   Help from another person eating meals?: None Help from another person taking care of personal grooming?: A Little Help from another person toileting, which includes using toliet, bedpan, or urinal?: A Little Help from another person bathing  (including washing, rinsing, drying)?: A Little Help from another person to put on and taking off regular upper body clothing?: A Little Help from another person to put on and taking off regular lower body clothing?: A Little 6 Click Score: 19    End of Session Equipment Utilized During Treatment: Oxygen  OT Visit Diagnosis: Unsteadiness on feet (R26.81)   Activity Tolerance Patient tolerated treatment well   Patient Left in chair;with call bell/phone within reach;with family/visitor present   Nurse Communication Mobility status        Time: 0388-8280 OT Time Calculation (min): 18 min  Charges: OT General Charges $OT Visit: 1 Visit OT Treatments $Self Care/Home Management : 8-22 mins  Corinne Ports E. Avacyn Kloosterman, OTR/L Acute Rehabilitation Services [phone] [place] 05/27/2022, 12:08 PM

## 2022-05-27 NOTE — Progress Notes (Signed)
[facility]) Hospital Liaison Note  Notified by TOC manager of patient/family request for [facility] palliative services at home after discharge.   [facility] hospital liaison will follow patient for discharge disposition.   Please call with any hospice or outpatient palliative care related questions.   Thank you for the opportunity to participate in this patient's care.   Shanita Wicker, LCSW [facility] Hospital Liaison [phone]  

## 2022-05-27 NOTE — Telephone Encounter (Signed)
Prescription refill request for Eliquis received. Indication:Afib Last office visit:5/23 Scr:1.2 Age: 88 Weight:49.8 kg  Prescription refilled

## 2022-05-27 NOTE — Telephone Encounter (Signed)
Patient's daughter called and wanted to know about how she should taking the medication metoprolol tartrate (LOPRESSOR) 25 MG tablet

## 2022-05-27 NOTE — Care Management Important Message (Signed)
Important Message  Patient Details  Name: Veronica Young MRN: [id number] Date of Birth: 1933-01-20   Medicare Important Message Given:  Yes     [name] 05/27/2022, 11:26 AM

## 2022-05-27 NOTE — Telephone Encounter (Signed)
Daughter asked about dosing of metoprolol tartrate. Patient was recently admitted to [facility] for pulmonary hypertension. Her BP went down to 79/50. She was given midodrine while admitted. Upon discharge, her BP ranged 120/74 to 119/83, P 110. Dr. [name] recommended for patient to take metoprolol tartrate 25 mg in the morning and 25 mg in the evening for rate control. Daughter informed and voiced understanding.

## 2022-05-27 NOTE — TOC Transition Note (Signed)
Transition of Care [facility]) - CM/SW Discharge Note   Patient Details  Name: Veronica Young MRN: [id number] Date of Birth: [birth year]  Transition of Care [facility] CM/SW Contact:  Veronica Roys, RN Phone Number: 05/27/2022, 10:24 AM   Clinical Narrative:  Case Manager spoke with Veronica Young regarding disposition needs. Plan will be to return home with [place] with [facility]- daughter Veronica Young is agreeable with outpatient palliative Services. Daughter is agreeable for referral to go to [facility]. Referral submitted and the office is aware the patient will transition home today. Case Manager did ask for an increased liter flow oxygen orders- [employer] following. Daughter to drive patient home via private vehicle. Staff RN aware to call interpreter for discharge instructions. No further needs identified at this time.     Final next level of care: [place] Barriers to Discharge: No Barriers Identified   Patient Goals and CMS Choice Patient states their goals for this hospitalization and ongoing recovery are:: to return home with daughter. CMS Medicare.gov Compare Post Acute Care list provided to:: Patient Choice offered to / list presented to : Patient, Adult Children   Discharge Plan and Services In-house Referral: NA Discharge Planning Services: CM Consult Post Acute Care Choice: Home Health            DME Agency: NA       HH Arranged: PT, OT HH Agency: [facility] Date [place]: 05/23/22 Time [place]: 5947 Representative spoke with at [place]: Veronica Young   Readmission Risk Interventions     View : No data to display.

## 2022-05-27 NOTE — Progress Notes (Addendum)
Physical Therapy Treatment Patient Details Name: Veronica Young MRN: [id number] DOB: 1933-02-05 Today's Date: 05/27/2022   History of Present Illness 88 y.o. female admitted 5/29 with chest pain and increased oxygen requirement with CHF exacerbation. 5/31 Fall after sedating medication with possible rt weakness and head CT (-). PMhx: deafness (ASL), AFib, CAD, HTN, HLD, T2DM, CHF, CAD with PPM, lumbago, AKI on CKD, glaucoma (blind in the left eye    PT Comments    Pt pleasant and with improved balance. Gait limited by LLE pain. Pt requiring 3L at rest to achieve 96% and 4L during gait. D/C plan remains appropriate and continue to recommend RW use when not working with therapy. Family and pt educated for progressive activity at home and continued ambulation and sit to stands throughout day to improve function and endurance. Interpreter Laurin Coder utilized throughout.    Recommendations for follow up therapy are one component of a multi-disciplinary discharge planning process, led by the attending physician.  Recommendations may be updated based on patient status, additional functional criteria and insurance authorization.  Follow Up Recommendations  Home health PT     Assistance Recommended at Discharge Frequent or constant Supervision/Assistance  Patient can return home with the following A little help with walking and/or transfers;A little help with bathing/dressing/bathroom;Assistance with cooking/housework;Assist for transportation;Direct supervision/assist for medications management;Help with stairs or ramp for entrance   Equipment Recommendations  Rolling walker (2 wheels)    Recommendations for Other Services       Precautions / Restrictions Precautions Precautions: Fall     Mobility  Bed Mobility Overal bed mobility: Modified Independent             General bed mobility comments: pt able to transition to sitting with use of rail with HOB 15 degrees and no physical  assist    Transfers Overall transfer level: Needs assistance   Transfers: Sit to/from Stand Sit to Stand: Min guard           General transfer comment: guarding with posterior LOB on initial stand with pt able to recover with environmental support    Ambulation/Gait Ambulation/Gait assistance: Min assist Gait Distance (Feet): 90 Feet Assistive device: None Gait Pattern/deviations: Step-through pattern, Decreased stride length   Gait velocity interpretation: 1.31 - 2.62 ft/sec, indicative of limited community ambulator   General Gait Details: pt with min assist for 2 LOB during gait but significantly improved balance over prior session with RW still recommend when not working with therapy   Stairs             Wheelchair Mobility    Modified Rankin (Stroke Patients Only)       Balance Overall balance assessment: Needs assistance   Sitting balance-Leahy Scale: Good Sitting balance - Comments: static sitting without assist     Standing balance-Leahy Scale: Fair Standing balance comment: static standing without assist, able to take some steps but deficits noted                            Cognition Arousal/Alertness: Awake/alert Behavior During Therapy: WFL for tasks assessed/performed Overall Cognitive Status: Impaired/Different from baseline                           Safety/Judgement: Decreased awareness of deficits     General Comments: decreased awareness of balance deficits        Exercises Other Exercises Other Exercises: 5 repeated  sit to stands with use of bil UE in 50 sec and 42 seconds respectively over 2 trials    General Comments        Pertinent Vitals/Pain Pain Assessment Pain Assessment: No/denies pain    Home Living                          Prior Function            PT Goals (current goals can now be found in the care plan section) Progress towards PT goals: Progressing toward goals     Frequency    Min 3X/week      PT Plan Current plan remains appropriate    Co-evaluation              AM-PAC PT "6 Clicks" Mobility   Outcome Measure  Help needed turning from your back to your side while in a flat bed without using bedrails?: A Little Help needed moving from lying on your back to sitting on the side of a flat bed without using bedrails?: A Little Help needed moving to and from a bed to a chair (including a wheelchair)?: A Little Help needed standing up from a chair using your arms (e.g., wheelchair or bedside chair)?: A Little Help needed to walk in hospital room?: A Little Help needed climbing 3-5 steps with a railing? : A Lot 6 Click Score: 17    End of Session Equipment Utilized During Treatment: Gait belt Activity Tolerance: Patient tolerated treatment well Patient left: in chair;with call bell/phone within reach;with family/visitor present Nurse Communication: Mobility status PT Visit Diagnosis: Other abnormalities of gait and mobility (R26.89);Difficulty in walking, not elsewhere classified (R26.2);Unsteadiness on feet (R26.81)     Time: 5465-6812 PT Time Calculation (min) (ACUTE ONLY): 33 min  Charges:  $Gait Training: 8-22 mins $Therapeutic Exercise: 8-22 mins                    Bayard Males, PT Acute Rehabilitation Services Secure chat preferred Office: [phone]    Veronica Young 05/27/2022, 10:03 AM

## 2022-05-30 ENCOUNTER — Telehealth: Payer: Self-pay

## 2022-05-30 NOTE — Telephone Encounter (Signed)
Spoke with patient's daughter [name] and scheduled a in person Palliative Consult for 06/20/22 @ 3 PM. Patient needed a in person visit on a Thursday afternoon.   Consent obtained; updated Netsmart, Team List and Epic.

## 2022-06-03 ENCOUNTER — Telehealth: Payer: Self-pay | Admitting: Cardiology

## 2022-06-03 NOTE — Telephone Encounter (Signed)
The daughter (DPR)  calling in with weight gain of 2 lbs over night from 110 to 112. No changes in diet. Taking medications without missed doses. No swelling or shortness of breath noted.  VS:  6/12 - 93/50 6/11 - 114/77 6/10 - 117/77 6/9 - 120/87 6/8 - 114/77  ED precautions given. Verbalized understanding.  Will forward to MD for advisement.

## 2022-06-03 NOTE — Telephone Encounter (Signed)
Continue to monitor and let us know if weight goes up more.   Peter [name] MD, [facility]   Notified Rose (DPR) of recommendations. Verbalized understanding and agreement.

## 2022-06-03 NOTE — Telephone Encounter (Signed)
Pt c/o BP issue: STAT if pt c/o blurred vision, one-sided weakness or slurred speech  1. What are your last 5 BP readings?   6/12 - 93/50 6/11 - 114/77 6/10 - 117/77 6/9 - 120/87 6/8 - 114/77  2. Are you having any other symptoms (ex. Dizziness, headache, blurred vision, passed out)? Weight is up 2lbs from yesterday  3. What is your BP issue? Daughter is concerned about the patient's low blood pressure. Requesting callback from [place].

## 2022-06-04 ENCOUNTER — Inpatient Hospital Stay (HOSPITAL_BASED_OUTPATIENT_CLINIC_OR_DEPARTMENT_OTHER)
Admission: EM | Admit: 2022-06-04 | Discharge: 2022-06-22 | DRG: 291 | Disposition: E | Payer: Medicare HMO | Attending: Internal Medicine | Admitting: Internal Medicine

## 2022-06-04 ENCOUNTER — Encounter (HOSPITAL_BASED_OUTPATIENT_CLINIC_OR_DEPARTMENT_OTHER): Payer: Self-pay

## 2022-06-04 ENCOUNTER — Emergency Department (HOSPITAL_BASED_OUTPATIENT_CLINIC_OR_DEPARTMENT_OTHER): Payer: Medicare HMO

## 2022-06-04 DIAGNOSIS — Z79899 Other long term (current) drug therapy: Secondary | ICD-10-CM | POA: Diagnosis not present

## 2022-06-04 DIAGNOSIS — I495 Sick sinus syndrome: Secondary | ICD-10-CM | POA: Diagnosis present

## 2022-06-04 DIAGNOSIS — Z8249 Family history of ischemic heart disease and other diseases of the circulatory system: Secondary | ICD-10-CM

## 2022-06-04 DIAGNOSIS — E039 Hypothyroidism, unspecified: Secondary | ICD-10-CM | POA: Diagnosis present

## 2022-06-04 DIAGNOSIS — Z66 Do not resuscitate: Secondary | ICD-10-CM | POA: Diagnosis present

## 2022-06-04 DIAGNOSIS — J690 Pneumonitis due to inhalation of food and vomit: Secondary | ICD-10-CM | POA: Diagnosis not present

## 2022-06-04 DIAGNOSIS — E7800 Pure hypercholesterolemia, unspecified: Secondary | ICD-10-CM | POA: Diagnosis present

## 2022-06-04 DIAGNOSIS — Z9981 Dependence on supplemental oxygen: Secondary | ICD-10-CM

## 2022-06-04 DIAGNOSIS — Z96651 Presence of right artificial knee joint: Secondary | ICD-10-CM | POA: Diagnosis present

## 2022-06-04 DIAGNOSIS — E1122 Type 2 diabetes mellitus with diabetic chronic kidney disease: Secondary | ICD-10-CM | POA: Diagnosis not present

## 2022-06-04 DIAGNOSIS — I130 Hypertensive heart and chronic kidney disease with heart failure and stage 1 through stage 4 chronic kidney disease, or unspecified chronic kidney disease: Principal | ICD-10-CM | POA: Diagnosis present

## 2022-06-04 DIAGNOSIS — J449 Chronic obstructive pulmonary disease, unspecified: Secondary | ICD-10-CM | POA: Diagnosis not present

## 2022-06-04 DIAGNOSIS — I272 Pulmonary hypertension, unspecified: Secondary | ICD-10-CM | POA: Diagnosis not present

## 2022-06-04 DIAGNOSIS — E876 Hypokalemia: Secondary | ICD-10-CM

## 2022-06-04 DIAGNOSIS — Z681 Body mass index (BMI) 19 or less, adult: Secondary | ICD-10-CM | POA: Diagnosis not present

## 2022-06-04 DIAGNOSIS — I480 Paroxysmal atrial fibrillation: Secondary | ICD-10-CM

## 2022-06-04 DIAGNOSIS — I4821 Permanent atrial fibrillation: Secondary | ICD-10-CM | POA: Diagnosis present

## 2022-06-04 DIAGNOSIS — N1832 Chronic kidney disease, stage 3b: Secondary | ICD-10-CM | POA: Diagnosis not present

## 2022-06-04 DIAGNOSIS — E440 Moderate protein-calorie malnutrition: Secondary | ICD-10-CM | POA: Diagnosis not present

## 2022-06-04 DIAGNOSIS — E1151 Type 2 diabetes mellitus with diabetic peripheral angiopathy without gangrene: Secondary | ICD-10-CM | POA: Diagnosis not present

## 2022-06-04 DIAGNOSIS — I3139 Other pericardial effusion (noninflammatory): Secondary | ICD-10-CM | POA: Diagnosis present

## 2022-06-04 DIAGNOSIS — K219 Gastro-esophageal reflux disease without esophagitis: Secondary | ICD-10-CM | POA: Diagnosis present

## 2022-06-04 DIAGNOSIS — I10 Essential (primary) hypertension: Secondary | ICD-10-CM

## 2022-06-04 DIAGNOSIS — Z823 Family history of stroke: Secondary | ICD-10-CM

## 2022-06-04 DIAGNOSIS — I2510 Atherosclerotic heart disease of native coronary artery without angina pectoris: Secondary | ICD-10-CM | POA: Diagnosis not present

## 2022-06-04 DIAGNOSIS — Z7901 Long term (current) use of anticoagulants: Secondary | ICD-10-CM | POA: Diagnosis not present

## 2022-06-04 DIAGNOSIS — Z79890 Hormone replacement therapy (postmenopausal): Secondary | ICD-10-CM | POA: Diagnosis not present

## 2022-06-04 DIAGNOSIS — N179 Acute kidney failure, unspecified: Secondary | ICD-10-CM | POA: Diagnosis not present

## 2022-06-04 DIAGNOSIS — J9621 Acute and chronic respiratory failure with hypoxia: Secondary | ICD-10-CM

## 2022-06-04 DIAGNOSIS — Z800 Family history of malignant neoplasm of digestive organs: Secondary | ICD-10-CM

## 2022-06-04 DIAGNOSIS — I5033 Acute on chronic diastolic (congestive) heart failure: Secondary | ICD-10-CM

## 2022-06-04 DIAGNOSIS — H409 Unspecified glaucoma: Secondary | ICD-10-CM | POA: Diagnosis present

## 2022-06-04 DIAGNOSIS — Z950 Presence of cardiac pacemaker: Secondary | ICD-10-CM

## 2022-06-04 DIAGNOSIS — R54 Age-related physical debility: Secondary | ICD-10-CM | POA: Diagnosis present

## 2022-06-04 DIAGNOSIS — N189 Chronic kidney disease, unspecified: Secondary | ICD-10-CM

## 2022-06-04 DIAGNOSIS — I509 Heart failure, unspecified: Secondary | ICD-10-CM | POA: Diagnosis not present

## 2022-06-04 DIAGNOSIS — J9601 Acute respiratory failure with hypoxia: Secondary | ICD-10-CM

## 2022-06-04 DIAGNOSIS — R079 Chest pain, unspecified: Secondary | ICD-10-CM

## 2022-06-04 DIAGNOSIS — D72828 Other elevated white blood cell count: Secondary | ICD-10-CM | POA: Diagnosis present

## 2022-06-04 DIAGNOSIS — Z9049 Acquired absence of other specified parts of digestive tract: Secondary | ICD-10-CM

## 2022-06-04 DIAGNOSIS — E119 Type 2 diabetes mellitus without complications: Secondary | ICD-10-CM

## 2022-06-04 LAB — COMPREHENSIVE METABOLIC PANEL
ALT: 19 U/L (ref 0–44)
AST: 31 U/L (ref 15–41)
Albumin: 3.2 g/dL — ABNORMAL LOW (ref 3.5–5.0)
Alkaline Phosphatase: 174 U/L — ABNORMAL HIGH (ref 38–126)
Anion gap: 8 (ref 5–15)
BUN: 31 mg/dL — ABNORMAL HIGH (ref 8–23)
CO2: 29 mmol/L (ref 22–32)
Calcium: 8.7 mg/dL — ABNORMAL LOW (ref 8.9–10.3)
Chloride: 98 mmol/L (ref 98–111)
Creatinine, Ser: 1.17 mg/dL — ABNORMAL HIGH (ref 0.44–1.00)
GFR, Estimated: 45 mL/min — ABNORMAL LOW (ref >60)
Glucose, Bld: 149 mg/dL — ABNORMAL HIGH (ref 70–99)
Potassium: 3.1 mmol/L — ABNORMAL LOW (ref 3.5–5.1)
Sodium: 135 mmol/L (ref 135–145)
Total Bilirubin: 1.7 mg/dL — ABNORMAL HIGH (ref 0.3–1.2)
Total Protein: 7.2 g/dL (ref 6.5–8.1)

## 2022-06-04 LAB — BLOOD GAS, VENOUS
Acid-Base Excess: 8.7 mmol/L — ABNORMAL HIGH (ref 0.0–2.0)
Bicarbonate: 32.8 mmol/L — ABNORMAL HIGH (ref 20.0–28.0)
O2 Saturation: 83.8 %
Patient temperature: 36.9
pCO2, Ven: 42 mmHg — ABNORMAL LOW (ref 44–60)
pH, Ven: 7.5 — ABNORMAL HIGH (ref 7.25–7.43)
pO2, Ven: 49 mmHg — ABNORMAL HIGH (ref 32–45)

## 2022-06-04 LAB — BRAIN NATRIURETIC PEPTIDE: B Natriuretic Peptide: 922.7 pg/mL — ABNORMAL HIGH (ref 0.0–100.0)

## 2022-06-04 LAB — CBC
HCT: 42.8 % (ref 36.0–46.0)
Hemoglobin: 13.8 g/dL (ref 12.0–15.0)
MCH: 28.8 pg (ref 26.0–34.0)
MCHC: 32.2 g/dL (ref 30.0–36.0)
MCV: 89.4 fL (ref 80.0–100.0)
Platelets: 218 10*3/uL (ref 150–400)
RBC: 4.79 MIL/uL (ref 3.87–5.11)
RDW: 18.4 % — ABNORMAL HIGH (ref 11.5–15.5)
WBC: 9.8 10*3/uL (ref 4.0–10.5)
nRBC: 0 % (ref 0.0–0.2)

## 2022-06-04 LAB — BASIC METABOLIC PANEL
Anion gap: 9 (ref 5–15)
BUN: 32 mg/dL — ABNORMAL HIGH (ref 8–23)
CO2: 30 mmol/L (ref 22–32)
Calcium: 9.2 mg/dL (ref 8.9–10.3)
Chloride: 96 mmol/L — ABNORMAL LOW (ref 98–111)
Creatinine, Ser: 1.48 mg/dL — ABNORMAL HIGH (ref 0.44–1.00)
GFR, Estimated: 34 mL/min — ABNORMAL LOW (ref >60)
Glucose, Bld: 199 mg/dL — ABNORMAL HIGH (ref 70–99)
Potassium: 3.9 mmol/L (ref 3.5–5.1)
Sodium: 135 mmol/L (ref 135–145)

## 2022-06-04 LAB — GLUCOSE, CAPILLARY: Glucose-Capillary: 140 mg/dL — ABNORMAL HIGH (ref 70–99)

## 2022-06-04 LAB — TROPONIN I (HIGH SENSITIVITY)
Troponin I (High Sensitivity): 15 ng/L (ref <18)
Troponin I (High Sensitivity): 15 ng/L (ref <18)

## 2022-06-04 LAB — CK: Total CK: 38 U/L (ref 38–234)

## 2022-06-04 LAB — PROCALCITONIN: Procalcitonin: 0.29 ng/mL

## 2022-06-04 LAB — PHOSPHORUS: Phosphorus: 2.5 mg/dL (ref 2.5–4.6)

## 2022-06-04 LAB — MAGNESIUM: Magnesium: 2.3 mg/dL (ref 1.7–2.4)

## 2022-06-04 MED ORDER — HYDROCODONE-ACETAMINOPHEN 5-325 MG PO TABS: 1.0000 | ORAL_TABLET | ORAL | Status: DC | PRN

## 2022-06-04 MED ORDER — SODIUM CHLORIDE 0.9 % IV SOLN: 250.0000 mL | INTRAVENOUS | Status: DC | PRN

## 2022-06-04 MED ORDER — SODIUM CHLORIDE 0.9% FLUSH
3.0000 mL | Freq: Two times a day (BID) | INTRAVENOUS | Status: DC
  Administered 2022-06-04 – 2022-06-06 (×4): 3 mL via INTRAVENOUS

## 2022-06-04 MED ORDER — SODIUM CHLORIDE 0.9% FLUSH: 3.0000 mL | INTRAVENOUS | Status: DC | PRN

## 2022-06-04 MED ORDER — ACETAMINOPHEN 325 MG PO TABS
650.0000 mg | ORAL_TABLET | Freq: Four times a day (QID) | ORAL | Status: DC | PRN
  Administered 2022-06-06: 650 mg via ORAL
  Filled 2022-06-04: qty 2

## 2022-06-04 MED ORDER — LEVOTHYROXINE SODIUM 100 MCG PO TABS
175.0000 ug | ORAL_TABLET | Freq: Every day | ORAL | Status: DC
  Administered 2022-06-05 – 2022-06-06 (×2): 175 ug via ORAL
  Filled 2022-06-04 (×2): qty 1

## 2022-06-04 MED ORDER — ACETAMINOPHEN 650 MG RE SUPP: 650.0000 mg | Freq: Four times a day (QID) | RECTAL | Status: DC | PRN

## 2022-06-04 MED ORDER — ALBUTEROL SULFATE (2.5 MG/3ML) 0.083% IN NEBU: 2.5000 mg | INHALATION_SOLUTION | RESPIRATORY_TRACT | Status: DC | PRN

## 2022-06-04 MED ORDER — ESCITALOPRAM OXALATE 10 MG PO TABS
10.0000 mg | ORAL_TABLET | Freq: Every day | ORAL | Status: DC
  Administered 2022-06-05 – 2022-06-06 (×2): 10 mg via ORAL
  Filled 2022-06-04 (×2): qty 1

## 2022-06-04 MED ORDER — FUROSEMIDE 10 MG/ML IJ SOLN
40.0000 mg | Freq: Once | INTRAMUSCULAR | Status: AC
  Administered 2022-06-04: 40 mg via INTRAVENOUS
  Filled 2022-06-04: qty 4

## 2022-06-04 MED ORDER — APIXABAN 2.5 MG PO TABS
2.5000 mg | ORAL_TABLET | Freq: Two times a day (BID) | ORAL | Status: DC
  Administered 2022-06-04 – 2022-06-06 (×4): 2.5 mg via ORAL
  Filled 2022-06-04 (×4): qty 1

## 2022-06-04 MED ORDER — POTASSIUM CHLORIDE CRYS ER 20 MEQ PO TBCR
40.0000 meq | EXTENDED_RELEASE_TABLET | Freq: Once | ORAL | Status: AC
  Administered 2022-06-04: 40 meq via ORAL
  Filled 2022-06-04: qty 2

## 2022-06-04 MED ORDER — ENSURE ENLIVE PO LIQD
237.0000 mL | Freq: Two times a day (BID) | ORAL | Status: DC
  Administered 2022-06-05 – 2022-06-06 (×4): 237 mL via ORAL

## 2022-06-04 MED ORDER — LATANOPROST 0.005 % OP SOLN
1.0000 [drp] | Freq: Every day | OPHTHALMIC | Status: DC
Start: 2022-06-04 — End: 2022-06-07
  Administered 2022-06-04 – 2022-06-05 (×2): 1 [drp] via OPHTHALMIC
  Filled 2022-06-04: qty 2.5

## 2022-06-04 MED ORDER — POTASSIUM CHLORIDE 10 MEQ/100ML IV SOLN
10.0000 meq | INTRAVENOUS | Status: AC
  Administered 2022-06-04 – 2022-06-05 (×2): 10 meq via INTRAVENOUS
  Filled 2022-06-04 (×2): qty 100

## 2022-06-04 MED ORDER — INSULIN ASPART 100 UNIT/ML IJ SOLN
0.0000 [IU] | Freq: Three times a day (TID) | INTRAMUSCULAR | Status: DC
  Administered 2022-06-05: 3 [IU] via SUBCUTANEOUS
  Administered 2022-06-05: 1 [IU] via SUBCUTANEOUS
  Administered 2022-06-05 – 2022-06-06 (×2): 3 [IU] via SUBCUTANEOUS
  Administered 2022-06-06 (×2): 2 [IU] via SUBCUTANEOUS

## 2022-06-04 MED ORDER — DORZOLAMIDE HCL-TIMOLOL MAL 2-0.5 % OP SOLN
1.0000 [drp] | Freq: Two times a day (BID) | OPHTHALMIC | Status: DC
  Administered 2022-06-04 – 2022-06-06 (×4): 1 [drp] via OPHTHALMIC
  Filled 2022-06-04: qty 10

## 2022-06-04 MED ORDER — METOPROLOL TARTRATE 25 MG PO TABS
25.0000 mg | ORAL_TABLET | Freq: Two times a day (BID) | ORAL | Status: DC
  Administered 2022-06-04 – 2022-06-06 (×4): 25 mg via ORAL
  Filled 2022-06-04 (×4): qty 1

## 2022-06-04 MED ORDER — FUROSEMIDE 10 MG/ML IJ SOLN
40.0000 mg | Freq: Every day | INTRAMUSCULAR | Status: DC
Start: 2022-06-05 — End: 2022-06-06
  Administered 2022-06-05: 40 mg via INTRAVENOUS
  Filled 2022-06-04 (×2): qty 4

## 2022-06-04 MED ORDER — BRIMONIDINE TARTRATE 0.2 % OP SOLN
1.0000 [drp] | Freq: Two times a day (BID) | OPHTHALMIC | Status: DC
Start: 2022-06-04 — End: 2022-06-07
  Administered 2022-06-04 – 2022-06-06 (×4): 1 [drp] via OPHTHALMIC
  Filled 2022-06-04: qty 5

## 2022-06-04 MED ORDER — GUAIFENESIN ER 600 MG PO TB12
600.0000 mg | ORAL_TABLET | Freq: Two times a day (BID) | ORAL | Status: DC
  Administered 2022-06-04 – 2022-06-06 (×4): 600 mg via ORAL
  Filled 2022-06-04 (×4): qty 1

## 2022-06-04 NOTE — Assessment & Plan Note (Signed)
Renal function currently improving back to baseline

## 2022-06-04 NOTE — Assessment & Plan Note (Signed)
Continue metoprolol 25 mg twice a day stable

## 2022-06-04 NOTE — H&P (Signed)
Veronica Young PZW:[id number] DOB: [birth year] DOA: 06/08/2022     PCP: Katherina Mires, MD   Outpatient Specialists:  CARDS: Dr. Monico Blitz    Patient arrived to ER on 06/12/2022 at 1129 Referred by Attending Damita Lack, MD   Patient coming from:    home Lives With family    Chief Complaint:   Chief Complaint  Patient presents with   Chest Pain    HPI: Veronica Young is a 88 y.o. female with medical history significant of CHF, diabetes, diastolic heart failure, hypertension, hyperlipidemia, peripheral arterial arterial disease, paroxysmal atrial tachycardia, persistent atrial fibrillation on Eliquis, sick sinus syndrome with a pacemaker placed in 2015,       Presented with this of breath chest pain   In the night patient started to complain about nausea and chest pain.  Worse with inspiration She was just admitted 2 weeks ago for chest pain CHF and pulmonary hypertension at baseline she is on 4 L of nasal cannula. Of note patient is with congenital deafness and requires sign language to communicate. Chest x-ray showed pleural effusion and elevated BNP  Patient has not had any cough or fever she does have intermittent right leg pain Has been on Eliquis as prescribed   Family denies any fevers or chills no cough chest pain reproducible by palpation patient walks with a walker no recent falls She is on 4 L at home of oxygen Has been compliant with her home medications Regarding pertinent Chronic problems:      HTN on metoprolol   chronic CHF diastolic/systolic/ combined - last echo last week There is severe left ventricular  hypertrophy.  On Lasix   Peripheral arterial disease known bilateral popliteal occlusions with conservative management favored.    DM 2 -  Lab Results  Component Value Date   HGBA1C 6.1 (H) 05/21/2022    diet controlled   Hypothyroidism:  Lab Results  Component Value Date   TSH 0.296 (L) 05/20/2022   on synthroid     A. Fib -  -  CHA2DS2 vas score   7     current  on anticoagulation with   Eliquis,           -  Rate control:  Currently controlled with Metoprolol            CKD stage IIIa- baseline Cr 1.3 Estimated Creatinine Clearance: 21.6 mL/min (A) (by C-G formula based on SCr of 1.48 mg/dL (H)).  Lab Results  Component Value Date   CREATININE 1.48 (H) 06/16/2022   CREATININE 1.27 (H) 05/27/2022   CREATININE 1.47 (H) 05/26/2022   Chronic anemia - baseline hg Hemoglobin & Hematocrit  Recent Labs    05/25/22 0151 05/27/22 0133 05/25/2022 1158  HGB 10.8* 11.5* 13.8     While in ER:    Chest x-ray showing cardiomegaly with small bilateral pleural effusions more so on the right no pulmonary edema cannot rule out pneumonia     CXR -pleural effusion    Following Medications were ordered in ER: Medications  furosemide (LASIX) injection 40 mg (40 mg Intravenous Given 05/30/2022 1530)     ED Triage Vitals  Enc Vitals Group     BP 05/29/2022 1141 (!) 143/86     Pulse Rate 06/15/2022 1141 86     Resp 06/20/2022 1141 (!) 26     Temp 06/01/2022 1141 98.3 F (36.8 C)     Temp Source 06/15/2022 1141 Oral  SpO2 06/16/2022 1141 92 %     Weight 06/05/2022 1138 111 lb 12.4 oz (50.7 kg)     Height 06/11/2022 1138 5\' 5"  (1.651 m)     Head Circumference --      Peak Flow --      Pain Score 06/03/2022 1137 8     Pain Loc --      Pain Edu? --      Excl. in [place]? --   TMAX(24)@     _________________________________________ Significant initial  Findings: Abnormal Labs Reviewed  BASIC METABOLIC PANEL - Abnormal; Notable for the following components:      Result Value   Chloride 96 (*)    Glucose, Bld 199 (*)    BUN 32 (*)    Creatinine, Ser 1.48 (*)    GFR, Estimated 34 (*)    All other components within normal limits  CBC - Abnormal; Notable for the following components:   RDW 18.4 (*)    All other components within normal limits  BRAIN NATRIURETIC PEPTIDE - Abnormal; Notable for the following components:   B  Natriuretic Peptide 922.7 (*)    All other components within normal limits     _________________________ Troponin  15- 15 ECG: Ordered Personally reviewed by me showing: HR : 81 Rhythm:Atrial fibrillation , artifact limits evaluation and question areas of sinus rhythm, however overall consistent with atrial fibrillation Borderline right axis deviation Repol abnrm suggests ischemia, anterolateral QTC 396     The recent clinical data is shown below. Vitals:   06/20/2022 1915 06/12/2022 1945 06/20/2022 2000 06/13/2022 2002  BP: (!) 141/81   (!) 143/86  Pulse: 79 80 83 83  Resp:  20 (!) 22 (!) 22  Temp:    98.5 F (36.9 C)  TempSrc:    Oral  SpO2:  95% 92% 92%  Weight:    50.2 kg  Height:    5\' 5"  (1.651 m)     WBC     Component Value Date/Time   WBC 9.8 06/08/2022 1158   LYMPHSABS 1.0 05/25/2022 0151   MONOABS 0.7 05/25/2022 0151   EOSABS 0.3 05/25/2022 0151   BASOSABS 0.0 05/25/2022 0151      Procalcitonin   Ordered   _______________________________________________ Hospitalist was called for admission for Cp and SOB   The following Work up has been ordered so far:  Orders Placed This Encounter  Procedures   DG Chest [facility] [employer] 1 View   Basic metabolic panel   CBC   Brain natriuretic peptide   Document Height and Actual Weight   Cardiac monitoring   Consult to hospitalist   ED EKG   EKG 12-Lead   Place in observation (patient's expected length of stay will be less than 2 midnights)     OTHER Significant initial  Findings:  labs showing:    Recent Labs  Lab 06/17/2022 1158  NA 135  K 3.9  CO2 30  GLUCOSE 199*  BUN 32*  CREATININE 1.48*  CALCIUM 9.2    Cr    stable,  Lab Results  Component Value Date   CREATININE 1.48 (H) 06/19/2022   CREATININE 1.27 (H) 05/27/2022   CREATININE 1.47 (H) 05/26/2022    Recent Labs  Lab 06/20/2022 2213  AST 31  ALT 19  ALKPHOS 174*  BILITOT 1.7*  PROT 7.2  ALBUMIN 3.2*   Lab Results  Component Value Date    CALCIUM 9.2 06/20/2022   PHOS 3.1 01/10/2022  Plt: Lab Results  Component Value Date   PLT 218 06/04/2022      COVID-19 Labs  No results for input(s): "DDIMER", "FERRITIN", "LDH", "CRP" in the last 72 hours.  Lab Results  Component Value Date   SARSCOV2NAA NEGATIVE 01/13/2022   SARSCOV2NAA NEGATIVE 01/08/2022   SARSCOV2NAA NEGATIVE 11/14/2020   [place] NEGATIVE 04/02/2020    Venous  Blood Gas result:  pH    Latest Reference Range & Units Most Recent  pH, Ven 7.25 - 7.43  7.5 (H) 05/29/2022 22:13  pCO2, Ven 44 - 60 mmHg 42 (L) 06/11/2022 22:13  pO2, Ven 32 - 45 mmHg 49 (H) 05/29/2022 22:13  (H): Data is abnormally high (L): Data is abnormally low     Recent Labs  Lab 05/25/2022 1158  WBC 9.8  HGB 13.8  HCT 42.8  MCV 89.4  PLT 218    HG/HCT  stable,      Component Value Date/Time   HGB 13.8 06/21/2022 1158   HGB 12.6 04/22/2022 1231   HCT 42.8 06/11/2022 1158   HCT 39.7 04/22/2022 1231   MCV 89.4 06/07/2022 1158   MCV 88 04/22/2022 1231    Cardiac Panel (last 3 results) Recent Labs    05/29/2022 2213  CKTOTAL 38    .car BNP (last 3 results) Recent Labs    01/13/22 1939 05/20/22 1742 06/16/2022 1150  BNP 767.6* 690.5* 922.7*      DM  labs:  HbA1C: Recent Labs    05/21/22 0647  HGBA1C 6.1*       CBG (last 3)  No results for input(s): "GLUCAP" in the last 72 hours.        Cultures:    Component Value Date/Time   SDES URINE, CLEAN CATCH 11/27/2012 1120   SPECREQUEST NONE 11/27/2012 1120   CULT INSIGNIFICANT GROWTH 11/27/2012 1120   REPTSTATUS 11/28/2012 FINAL 11/27/2012 1120     Radiological Exams on Admission: DG Chest Port 1 View  Result Date: 06/13/2022 CLINICAL DATA:  Chest pain, nausea EXAM: PORTABLE CHEST 1 VIEW COMPARISON:  Previous chest radiographs done on 05/23/2022 and CT done on 05/24/2022 FINDINGS: Transverse diameter of heart is increased. Apparent shift of mediastinum to the right may be due to rotation. Pacemaker  battery is seen in the left infraclavicular region. There is blunting of both lateral CP angles suggesting small bilateral effusions, more so on the right side. Possibility of underlying subsegmental atelectasis/pneumonia is not excluded. IMPRESSION: Cardiomegaly. Small bilateral pleural effusions, more so on the right side. There are no signs of alveolar pulmonary edema. Increased density in both lower lung fields may be due to pleural effusion and possibly underlying atelectasis/pneumonia. Electronically Signed   By: Elmer Picker M.D.   On: 06/02/2022 12:25   _______________________________________________________________________________________________________ Latest  Blood pressure (!) 143/86, pulse 83, temperature 98.5 F (36.9 C), temperature source Oral, resp. rate (!) 22, height 5\' 5"  (1.651 m), weight 50.2 kg, SpO2 92 %.   Vitals  labs and radiology finding personally reviewed  Review of Systems:    Pertinent positives include:  shortness of breath at rest chest pain,   dyspnea on exertion, Constitutional:  No weight loss, night sweats, Fevers, chills, fatigue, weight loss  HEENT:  No headaches, Difficulty swallowing,Tooth/dental problems,Sore throat,  No sneezing, itching, ear ache, nasal congestion, post nasal drip,  Cardio-vascular:  No Orthopnea, PND, anasarca, dizziness, palpitations.no Bilateral lower extremity swelling  GI:  No heartburn, indigestion, abdominal pain, nausea, vomiting, diarrhea, change in bowel habits, loss of appetite,  melena, blood in stool, hematemesis Resp:    No excess mucus, no productive cough, No non-productive cough, No coughing up of blood.No change in color of mucus.No wheezing. Skin:  no rash or lesions. No jaundice GU:  no dysuria, change in color of urine, no urgency or frequency. No straining to urinate.  No flank pain.  Musculoskeletal:  No joint pain or no joint swelling. No decreased range of motion. No back pain.  Psych:  No  change in mood or affect. No depression or anxiety. No memory loss.  Neuro: no localizing neurological complaints, no tingling, no weakness, no double vision, no gait abnormality, no slurred speech, no confusion  All systems reviewed and apart from [place] all are negative _______________________________________________________________________________________________ Past Medical History:   Past Medical History:  Diagnosis Date   Carotid bruit    CHF (congestive heart failure) (HCC)    Deafness    Diabetes mellitus    type 2   Diastolic heart failure (HCC)    EF is 60%   GERD (gastroesophageal reflux disease)    Glaucoma    Hypercholesterolemia    Hypertension    Hypothyroidism    Normal cardiac stress test January 2014   PAD (peripheral artery disease) ([place])    a. known bilateral popliteal occlusions with conservative management favored.   PAT (paroxysmal atrial tachycardia) (HCC)    Sick sinus syndrome [facility])    a. s/p PPM placement 07/2014    Past Surgical History:  Procedure Laterality Date   cholycystectomy     left breast cyst removed     PACEMAKER INSERTION  07-26-2014   MDT ADDRL1 pacemaker implanted by Dr Lovena Le for [place] N/A 07/26/2014   Procedure: PERMANENT PACEMAKER INSERTION;  Surgeon: Evans Lance, MD;  Location: [facility] C[facility] LAB;  Service: Cardiovascular;  Laterality: N/A;   TOTAL KNEE ARTHROPLASTY     right    Social History:  Ambulatory walker    reports that she has never smoked. She has never used smokeless tobacco. She reports that she does not drink alcohol and does not use drugs.   Family History:   Family History  Problem Relation Age of Onset   Heart disease Mother    Liver cancer Father    Heart disease Sister    Heart disease Brother    Stroke Sister    ______________________________________________________________________________________________ Allergies: Allergies  Allergen Reactions   Amlodipine Nausea Only         Benicar [Olmesartan Medoxomil] Nausea Only     Prior to Admission medications   Medication Sig Start Date End Date Taking? Authorizing Provider  acetaminophen (TYLENOL) 500 MG tablet Take 1,000 mg by mouth every 6 (six) hours as needed for moderate pain.    [provider]  apixaban (ELIQUIS) 2.5 MG TABS tablet TAKE 1 TABLET TWICE DAILY 05/27/22   Almyra Deforest, PA  atropine 1 % ophthalmic solution Place 1 drop into the left eye at bedtime.     [provider]  brimonidine (ALPHAGAN) 0.2 % ophthalmic solution Place 1 drop into both eyes 2 (two) times daily. 05/20/17   [provider]  calcium-vitamin D (OSCAL WITH D) 500-200 MG-UNIT per tablet Take 1 tablet by mouth daily with breakfast.    [provider]  cholecalciferol (VITAMIN D) 1000 UNITS tablet Take 1,000 Units by mouth daily.    [provider]  escitalopram (LEXAPRO) 10 MG tablet Take 10 mg by mouth daily. 11/19/21   [provider]  feeding supplement (ENSURE ENLIVE / ENSURE PLUS) LIQD Take 237 mLs by mouth 3 (three) times daily between meals. Patient taking differently: Take 237 mLs by mouth 2 (two) times daily between meals. 01/12/22   Pahwani, Michell Heinrich, MD  furosemide (LASIX) 40 MG tablet Take 1 tablet (40 mg total) by mouth 2 (two) times daily. 05/27/22   Patrecia Pour, MD  gabapentin (NEURONTIN) 100 MG capsule Take 100 mg by mouth at bedtime. 10/24/19   [provider]  latanoprost (XALATAN) 0.005 % ophthalmic solution Place 1 drop into both eyes in the morning and at bedtime. 11/25/15   [provider]  levothyroxine (SYNTHROID) 175 MCG tablet Take 175 mcg by mouth daily before breakfast.    [provider]  metoprolol tartrate (LOPRESSOR) 25 MG tablet TAKE 1 TABLET (25 MG TOTAL) BY MOUTH 2 (TWO) TIMES DAILY. Patient taking differently: Take 25 mg by mouth 2 (two) times daily. 02/04/22   [name], Peter M, MD  Multiple Vitamin (MULTIVITAMIN WITH MINERALS)  TABS tablet Take 1 tablet by mouth daily. 01/12/22   Pahwani, Michell Heinrich, MD  OXYGEN Inhale into the lungs. 2 liters as needed    [provider]  potassium chloride SA (KLOR-CON M) 20 MEQ tablet Take 1 tablet (20 mEq total) by mouth daily. 02/01/22   [name], Peter M, MD  RHOPRESSA 0.02 % SOLN Place 1 drop into the left eye at bedtime. 10/28/19   [provider]  triamcinolone ointment (KENALOG) 0.5 % Apply 1 application. topically 2 (two) times daily. 04/01/22   [provider]    ___________________________________________________________________________________________________ Physical Exam:    06/08/2022    8:02 PM 06/05/2022    8:00 PM 05/26/2022    7:45 PM  Vitals with BMI  Height 5\' 5"     Weight 110 lbs 11 oz    BMI 52.84    Systolic 132    Diastolic 86    Pulse 83 83 80     1. General:  in No  Acute distress  * Chronically ill *well *cachectic *toxic acutely ill -appearing 2. Psychological: Alert and   Oriented 3. Head/ENT:   Moist  Mucous Membranes                          Head Non traumatic, neck supple                            Poor Dentition 4. SKIN: normal  Skin turgor,  Skin clean Dry and intact no rash 5. Heart: Regular rate and rhythm  systolic Murmur, no Rub or gallop 6. Lungs  no wheezes diminished on the right  7. Abdomen: Soft,  non-tender, Non distended bowel sounds present 8. Lower extremities: no clubbing, cyanosis, no  edema 9. Neurologically strength 5 out of 5 in all 4 extremities cranial nerves II through XII intact 10. MSK: Normal range of motion    Chart has been reviewed  ______________________________________________________________________________________________  Assessment/Plan 88 y.o. female with medical history significant of CHF, diabetes, diastolic heart failure, hypertension, hyperlipidemia, peripheral arterial arterial disease, paroxysmal atrial tachycardia, persistent atrial fibrillation on Eliquis, sick sinus  syndrome with a pacemaker placed in 2015,     Admitted for chest pain CHF exacerbation   Present on Admission:  Acute on chronic diastolic CHF (congestive heart failure) (HCC)  Chest pain  Acute on chronic respiratory failure with hypoxia (HCC)  Acute kidney  injury superimposed on chronic kidney disease (HCC)  AF (paroxysmal atrial fibrillation) (HCC)  HTN (hypertension)  Malnutrition of moderate degree     Acute on chronic diastolic CHF (congestive heart failure) ([place]) - Pt diagnosed with CHF based on presence of the following: , cardiomegaly, Pulmonary edema on CXR,  pleural effusion  With noted response to IV diuretic in ER  admit on telemetry,  cycle cardiac enzymes, Troponin 15   obtain serial ECG  to evaluate for ischemia as a cause of heart failure  monitor daily weight:  Filed Weights   05/28/2022 1138 06/03/2022 2002  Weight: 50.7 kg 50.2 kg   Last BNP BNP (last 3 results) Recent Labs    01/13/22 1939 05/20/22 1742 06/13/2022 1150  BNP 767.6* 690.5* 922.7*      diurese with IV lasix 40 mg daily and monitor orthostatics and creatinine to avoid over diuresis.  Order echogram to evaluate EF and valves     cardiology consulted    Chest pain Seems to be reproducible by palpation suspect musculoskeletal troponin unremarkable EKG nonischemic continue to monitor Improved Currently chest pain-free  Acute on chronic respiratory failure with hypoxia ([place]) Most likely secondary to CHF exacerbation/pleural effusion. Patient at baseline of 4 L of oxygen.  We will continue current 5 titrate down as able  Acute kidney injury superimposed on chronic kidney disease ([place]) Renal function currently improving back to baseline  T2DM (type 2 diabetes mellitus) ([place]) Order sliding scale Diet controlled  AF (paroxysmal atrial fibrillation) (HCC) Chronic stable continue Eliquis at 2.5 mg twice a day.  Continue metoprolol 25 mg twice a day  HTN (hypertension) Continue metoprolol  25 mg twice a day stable  Malnutrition of moderate degree Check prealbumin will benefit from nutritional consult  Hypokalemia - will replace and repeat in AM,  check magnesium level and replace as needed     Other plan as per orders.  DVT prophylaxis:  Eliquis   Code Status:    Code Status: DNR   DNR/DNI  as per patient  family  I had personally discussed CODE STATUS with patient and family   Family Communication:   Family  at  Bedside  plan of care was discussed with  Daughter,    Disposition Plan:      To home once workup is complete and patient is stable   Following barriers for discharge:                            Electrolytes corrected                                                           Will need to be able to tolerate PO                                                       Will need consultants to evaluate patient prior to discharge                        Would benefit from PT/OT eval prior to DC  Ordered  Swallow eval - SLP ordered                                      Transition of care consulted                   Nutrition    consulted                                      Consults called: Emailed cardiology  Admission status:  ED Disposition     ED Disposition  [place]: [place] [[id number]]  Level of Care: Telemetry [5]  Admit to tele based on following criteria: Acute CHF  Interfacility transfer: Yes  May place patient in observation at [facility] or [place] if equivalent level of care is available:: Yes  Covid Evaluation: Asymptomatic - no recent exposure (last 10 days) testing not required  Diagnosis: Acute exacerbation of CHF (congestive heart failure) [facility]) [[id number]]  Admitting Physician: Gerlean Ren [facility] [[id number]]  Attending Physician: Gerlean Ren [facility] [[id number]]          Obs     Level of care telemetry 12H                  Carine Nordgren  [place] 05/31/2022, 11:26 PM    [facility]     after 2 AM please page floor coverage PA If 7AM-7PM, please contact the day team taking care of the patient using [email or web address]   Patient was evaluated in the context of the global COVID-19 pandemic, which necessitated consideration that the patient might be at risk for infection with the SARS-CoV-2 virus that causes COVID-19. Institutional protocols and algorithms that pertain to the evaluation of patients at risk for COVID-19 are in a state of rapid change based on information released by regulatory bodies including the CDC and federal and state organizations. These policies and algorithms were followed during the patient's care.

## 2022-06-04 NOTE — Assessment & Plan Note (Signed)
-   will replace and repeat in AM,  check magnesium level and replace as needed ° °

## 2022-06-04 NOTE — Assessment & Plan Note (Signed)
Seems to be reproducible by palpation suspect musculoskeletal troponin unremarkable EKG nonischemic continue to monitor Improved Currently chest pain-free

## 2022-06-04 NOTE — Assessment & Plan Note (Signed)
Order sliding scale Diet controlled

## 2022-06-04 NOTE — Assessment & Plan Note (Signed)
Most likely secondary to CHF exacerbation/pleural effusion. Patient at baseline of 4 L of oxygen.  We will continue current 5 titrate down as able

## 2022-06-04 NOTE — ED Triage Notes (Addendum)
During the night c/o nausea and chest pain, worse with deep inspiration. Recently admitted 2 weeks ago for chest pain. Hx of CHF & pulmonary hypertension. On 4L O2 via [place] at baseline  [facility], daughter using side language to communicate

## 2022-06-04 NOTE — Progress Notes (Signed)
88 yo 88 yo female with congential deafness, CKD stage 3b, diastolic heart failure, atrial fibrillation, sick sinus syndrome sp PPM, coronary artery disease, hypertension, dyslipidemia and T2DM who presented CHF exac and hypoxia to [facility] getting IV lasix. Recently admitted for chf exac and discharged home on 6/4.  Currently she is on 5L [place] in ED. CXR shows pleural effusion. Has elevated BNP.   Accepted to tele at [facility].   Gerlean Ren MD [facility]

## 2022-06-04 NOTE — Assessment & Plan Note (Signed)
Check prealbumin will benefit from nutritional consult

## 2022-06-04 NOTE — Assessment & Plan Note (Addendum)
-   Pt diagnosed with CHF based on presence of the following: , cardiomegaly, Pulmonary edema on CXR,  pleural effusion  With noted response to IV diuretic in ER  admit on telemetry,  cycle cardiac enzymes, Troponin 15   obtain serial ECG  to evaluate for ischemia as a cause of heart failure  monitor daily weight:  Filed Weights   05/25/2022 1138 06/05/2022 2002  Weight: 50.7 kg 50.2 kg   Last BNP BNP (last 3 results) Recent Labs    01/13/22 1939 05/20/22 1742 06/15/2022 1150  BNP 767.6* 690.5* 922.7*      diurese with IV lasix 40 mg daily and monitor orthostatics and creatinine to avoid over diuresis.  Order echogram to evaluate EF and valves     cardiology consulted

## 2022-06-04 NOTE — Assessment & Plan Note (Signed)
Chronic stable continue Eliquis at 2.5 mg twice a day.  Continue metoprolol 25 mg twice a day

## 2022-06-04 NOTE — Subjective & Objective (Addendum)
  In the night patient started to complain about nausea and chest pain.  Worse with inspiration She was just admitted 2 weeks ago for chest pain CHF and pulmonary hypertension at baseline she is on 4 L of nasal cannula. Of note patient is with congenital deafness and requires sign language to communicate. Chest x-ray showed pleural effusion and elevated BNP

## 2022-06-04 NOTE — ED Notes (Signed)
Pt refused purewick. RN notified.

## 2022-06-04 NOTE — ED Provider Notes (Signed)
[facility] EMERGENCY DEPARTMENT Provider Note   CSN: [id number] Arrival date & time: 06/12/2022  1129     History  Chief Complaint  Patient presents with   Chest Pain    Veronica Young is a 88 y.o. female.  HPI     88 year old female with a history of CHF, diabetes, diastolic heart failure, hypertension, hyperlipidemia, peripheral arterial arterial disease, paroxysmal atrial tachycardia, persistent atrial fibrillation on Eliquis, sick sinus syndrome with a pacemaker placed in 2015, deaf and requires a side language interpreter with recent admission to [facility] with concern for chest pain, congestive heart failure and pulmonary hypertension with acute on chronic hypoxemic respiratory failure, discharged on 4 L of oxygen who presents with concern for shortness of breath and chest pain.  Yesterday started having worsening shortness of breath CP and dyspnea have gotten better This morning she said it was sore to take a deep breath, more of shortness of breath but now that is better No cough, no fever, has right leg pain on and off Taking eliquis as prescribed  Used video sign language interperter     Past Medical History:  Diagnosis Date   Carotid bruit    CHF (congestive heart failure) ([place])    Deafness    Diabetes mellitus    type 2   Diastolic heart failure (HCC)    EF is 60%   GERD (gastroesophageal reflux disease)    Glaucoma    Hypercholesterolemia    Hypertension    Hypothyroidism    Normal cardiac stress test January 2014   PAD (peripheral artery disease) ([place])    a. known bilateral popliteal occlusions with conservative management favored.   PAT (paroxysmal atrial tachycardia) (HCC)    Sick sinus syndrome [facility])    a. s/p PPM placement 07/2014    Past Surgical History:  Procedure Laterality Date   cholycystectomy     left breast cyst removed     PACEMAKER INSERTION  07-26-2014   MDT [id number] pacemaker implanted by Dr Lovena Le for [place] N/A 07/26/2014   Procedure: PERMANENT PACEMAKER INSERTION;  Surgeon: Evans Lance, MD;  Location: [facility] CATH LAB;  Service: Cardiovascular;  Laterality: N/A;   TOTAL KNEE ARTHROPLASTY     right    Home Medications Prior to Admission medications   Medication Sig Start Date End Date Taking? Authorizing Provider  acetaminophen (TYLENOL) 500 MG tablet Take 1,000 mg by mouth every 6 (six) hours as needed for moderate pain.   Yes [provider]  apixaban (ELIQUIS) 2.5 MG TABS tablet TAKE 1 TABLET TWICE DAILY Patient taking differently: Take 2.5 mg by mouth 2 (two) times daily. 05/27/22  Yes Almyra Deforest, PA  brimonidine (ALPHAGAN) 0.2 % ophthalmic solution Place 1 drop into both eyes 2 (two) times daily. 05/20/17  Yes [provider]  calcium-vitamin D (OSCAL WITH D) 500-200 MG-UNIT per tablet Take 1 tablet by mouth daily with breakfast.   Yes [provider]  cholecalciferol (VITAMIN D) 1000 UNITS tablet Take 1,000 Units by mouth daily.   Yes [provider]  dorzolamide-timolol (COSOPT) 22.3-6.8 MG/ML ophthalmic solution Place 1 drop into both eyes 2 (two) times daily.   Yes [provider]  escitalopram (LEXAPRO) 10 MG tablet Take 10 mg by mouth daily. 11/19/21  Yes [provider]  feeding supplement (ENSURE ENLIVE / ENSURE PLUS) LIQD Take 237 mLs by mouth 3 (three) times daily between meals. Patient taking differently: Take 237 mLs  by mouth 2 (two) times daily between meals. 01/12/22  Yes Pahwani, Rinka R, MD  furosemide (LASIX) 40 MG tablet Take 1 tablet (40 mg total) by mouth 2 (two) times daily. Patient taking differently: Take 40 mg by mouth daily. 05/27/22  Yes Patrecia Pour, MD  latanoprost (XALATAN) 0.005 % ophthalmic solution Place 1 drop into both eyes at bedtime. 11/25/15  Yes [provider]  levothyroxine (SYNTHROID) 175 MCG tablet Take 175 mcg by mouth daily before breakfast.   Yes [provider]   metoprolol tartrate (LOPRESSOR) 25 MG tablet TAKE 1 TABLET (25 MG TOTAL) BY MOUTH 2 (TWO) TIMES DAILY. Patient taking differently: Take 25 mg by mouth 2 (two) times daily. 02/04/22  Yes [name], Peter M, MD  Multiple Vitamin (MULTIVITAMIN WITH MINERALS) TABS tablet Take 1 tablet by mouth daily. 01/12/22  Yes Pahwani, Rinka R, MD  potassium chloride SA (KLOR-CON M) 20 MEQ tablet Take 1 tablet (20 mEq total) by mouth daily. 02/01/22  Yes [name], Peter M, MD  triamcinolone ointment (KENALOG) 0.5 % Apply 1 application. topically 2 (two) times daily. 04/01/22  Yes [provider]  gabapentin (NEURONTIN) 100 MG capsule Take 100 mg by mouth at bedtime. Patient not taking: Reported on 06/01/2022 10/24/19   [provider]  OXYGEN Inhale into the lungs. 2 liters as needed    [provider]  RHOPRESSA 0.02 % SOLN Place 1 drop into the left eye at bedtime. Patient not taking: Reported on 05/29/2022 10/28/19   [provider]      Allergies    Amlodipine and Benicar [olmesartan medoxomil]    Review of Systems   Review of Systems  Physical Exam Updated Vital Signs BP (!) 143/86   Pulse 83   Temp 98.5 F (36.9 C) (Oral)   Resp (!) 22   Ht 5\' 5"  (1.651 m)   Wt 50.2 kg   SpO2 92%   BMI 18.42 kg/m  Physical Exam Vitals and nursing note reviewed.  Constitutional:      General: She is not in acute distress.    Appearance: She is well-developed. She is not diaphoretic.  HENT:     Head: Normocephalic and atraumatic.  Eyes:     Conjunctiva/sclera: Conjunctivae normal.  Neck:     Vascular: JVD present.  Cardiovascular:     Rate and Rhythm: Normal rate and regular rhythm.     Heart sounds: Normal heart sounds. No murmur heard.    No friction rub. No gallop.  Pulmonary:     Effort: Pulmonary effort is normal. No respiratory distress.     Breath sounds: Rales present. No wheezing.  Abdominal:     General: There is no distension.     Palpations: Abdomen is soft.      Tenderness: There is no abdominal tenderness. There is no guarding.  Musculoskeletal:        General: No tenderness.     Cervical back: Normal range of motion.     Comments: RLE swelling greater than left (chronic per pt)  Skin:    General: Skin is warm and dry.     Findings: No erythema or rash.  Neurological:     Mental Status: She is alert and oriented to person, place, and time.     ED Results / Procedures / Treatments   Labs (all labs ordered are listed, but only abnormal results are displayed) Labs Reviewed  BASIC METABOLIC PANEL - Abnormal; Notable for the following components:  Result Value   Chloride 96 (*)    Glucose, Bld 199 (*)    BUN 32 (*)    Creatinine, Ser 1.48 (*)    GFR, Estimated 34 (*)    All other components within normal limits  CBC - Abnormal; Notable for the following components:   RDW 18.4 (*)    All other components within normal limits  BRAIN NATRIURETIC PEPTIDE - Abnormal; Notable for the following components:   B Natriuretic Peptide 922.7 (*)    All other components within normal limits  COMPREHENSIVE METABOLIC PANEL - Abnormal; Notable for the following components:   Potassium 3.1 (*)    Glucose, Bld 149 (*)    BUN 31 (*)    Creatinine, Ser 1.17 (*)    Calcium 8.7 (*)    Albumin 3.2 (*)    Alkaline Phosphatase 174 (*)    Total Bilirubin 1.7 (*)    GFR, Estimated 45 (*)    All other components within normal limits  BLOOD GAS, VENOUS - Abnormal; Notable for the following components:   pH, Ven 7.5 (*)    pCO2, Ven 42 (*)    pO2, Ven 49 (*)    Bicarbonate 32.8 (*)    Acid-Base Excess 8.7 (*)    All other components within normal limits  RESPIRATORY PANEL BY PCR  MAGNESIUM  PHOSPHORUS  CK  COMPREHENSIVE METABOLIC PANEL  CBC  PROCALCITONIN  PROCALCITONIN  T3  T4, FREE  TROPONIN I (HIGH SENSITIVITY)  TROPONIN I (HIGH SENSITIVITY)    EKG EKG Interpretation  Date/Time:  Tuesday June 04 2022 11:43:14 EDT Ventricular Rate:   81 PR Interval:    QRS Duration: 85 QT Interval:  341 QTC Calculation: 396 R Axis:   86 Text Interpretation: Atrial fibrillation , artifact limits evaluation and question areas of sinus rhythm, however overall consistent with atrial fibrillation Borderline right axis deviation Repol abnrm suggests ischemia, anterolateral Similar to prior ECG 5/29 Confirmed by Gareth Morgan [phone]) on 05/26/2022 11:49:13 AM  Radiology DG Chest Port 1 View  Result Date: 05/27/2022 CLINICAL DATA:  Chest pain, nausea EXAM: PORTABLE CHEST 1 VIEW COMPARISON:  Previous chest radiographs done on 05/23/2022 and CT done on 05/24/2022 FINDINGS: Transverse diameter of heart is increased. Apparent shift of mediastinum to the right may be due to rotation. Pacemaker battery is seen in the left infraclavicular region. There is blunting of both lateral CP angles suggesting small bilateral effusions, more so on the right side. Possibility of underlying subsegmental atelectasis/pneumonia is not excluded. IMPRESSION: Cardiomegaly. Small bilateral pleural effusions, more so on the right side. There are no signs of alveolar pulmonary edema. Increased density in both lower lung fields may be due to pleural effusion and possibly underlying atelectasis/pneumonia. Electronically Signed   By: Elmer Picker M.D.   On: 05/31/2022 12:25    Procedures Procedures    Medications Ordered in ED Medications  insulin aspart (novoLOG) injection 0-9 Units (has no administration in time range)  apixaban (ELIQUIS) tablet 2.5 mg (has no administration in time range)  brimonidine (ALPHAGAN) 0.2 % ophthalmic solution 1 drop (has no administration in time range)  dorzolamide-timolol (COSOPT) 22.3-6.8 MG/ML ophthalmic solution 1 drop (has no administration in time range)  escitalopram (LEXAPRO) tablet 10 mg (has no administration in time range)  latanoprost (XALATAN) 0.005 % ophthalmic solution 1 drop (has no administration in time range)   levothyroxine (SYNTHROID) tablet 175 mcg (has no administration in time range)  metoprolol tartrate (LOPRESSOR) tablet 25 mg (has no  administration in time range)  acetaminophen (TYLENOL) tablet 650 mg (has no administration in time range)    Or  acetaminophen (TYLENOL) suppository 650 mg (has no administration in time range)  HYDROcodone-acetaminophen (NORCO/VICODIN) 5-325 MG per tablet 1-2 tablet (has no administration in time range)  furosemide (LASIX) injection 40 mg (has no administration in time range)  sodium chloride flush (NS) 0.9 % injection 3 mL (has no administration in time range)  sodium chloride flush (NS) 0.9 % injection 3 mL (has no administration in time range)  0.9 %  sodium chloride infusion (has no administration in time range)  albuterol (PROVENTIL) (2.5 MG/3ML) 0.083% nebulizer solution 2.5 mg (has no administration in time range)  guaiFENesin (MUCINEX) 12 hr tablet 600 mg (has no administration in time range)  furosemide (LASIX) injection 40 mg (40 mg Intravenous Given 06/11/2022 1530)    ED Course/ Medical Decision Making/ A&P                           Medical Decision Making Amount and/or Complexity of Data Reviewed Labs: ordered. Radiology: ordered.  Risk Prescription drug management. Decision regarding hospitalization.   88 year old female with a history of CHF, diabetes, diastolic heart failure, hypertension, hyperlipidemia, peripheral arterial arterial disease, paroxysmal atrial tachycardia, persistent atrial fibrillation on Eliquis, sick sinus syndrome with a pacemaker placed in 2015, deaf and requires a side language interpreter with recent admission to [facility] with concern for chest pain, congestive heart failure and pulmonary hypertension with acute on chronic hypoxemic respiratory failure, discharged on 4 L of oxygen who presents with concern for shortness of breath and chest pain.  Differential diagnosis for dyspnea includes ACS, PE, COPD  exacerbation, CHF exacerbation, anemia, pneumonia, viral etiology such as COVID 19 infection, metabolic abnormality.  Chest x-ray was done which showed pleural effusions, increased density bilateral lower lungs. EKG was evaluated by me which showed atrial fibrillation.  On eliquis, lower suspicion for PE, reports chronic leg asymmetry unchanged, and given low suspicion with eliquis compliance do not feel CTA appropriate with low GFR.  Troponin negative, doubt ACS.     BNP increased to 900s, O2 requirement increased to 5L from 4.  Given IV lasix, will admit with concern for acute on chronic hypoxic respiratory failure with concern for CHF.          Final Clinical Impression(s) / ED Diagnoses Final diagnoses:  Congestive heart failure, unspecified HF chronicity, unspecified heart failure type ([place])  Acute respiratory failure with hypoxia (HCC)  Chest pain, unspecified type    Rx / DC Orders ED Discharge Orders     None         Gareth Morgan, MD 05/26/2022 2316

## 2022-06-04 NOTE — Progress Notes (Signed)
Paged [place] at [phone].... needing orders.

## 2022-06-05 DIAGNOSIS — R072 Precordial pain: Secondary | ICD-10-CM

## 2022-06-05 DIAGNOSIS — I130 Hypertensive heart and chronic kidney disease with heart failure and stage 1 through stage 4 chronic kidney disease, or unspecified chronic kidney disease: Secondary | ICD-10-CM | POA: Diagnosis present

## 2022-06-05 DIAGNOSIS — Z79899 Other long term (current) drug therapy: Secondary | ICD-10-CM | POA: Diagnosis not present

## 2022-06-05 DIAGNOSIS — E039 Hypothyroidism, unspecified: Secondary | ICD-10-CM | POA: Diagnosis present

## 2022-06-05 DIAGNOSIS — E440 Moderate protein-calorie malnutrition: Secondary | ICD-10-CM | POA: Diagnosis present

## 2022-06-05 DIAGNOSIS — E7800 Pure hypercholesterolemia, unspecified: Secondary | ICD-10-CM | POA: Diagnosis present

## 2022-06-05 DIAGNOSIS — E1122 Type 2 diabetes mellitus with diabetic chronic kidney disease: Secondary | ICD-10-CM | POA: Diagnosis present

## 2022-06-05 DIAGNOSIS — I4821 Permanent atrial fibrillation: Secondary | ICD-10-CM | POA: Diagnosis present

## 2022-06-05 DIAGNOSIS — J449 Chronic obstructive pulmonary disease, unspecified: Secondary | ICD-10-CM | POA: Diagnosis present

## 2022-06-05 DIAGNOSIS — I272 Pulmonary hypertension, unspecified: Secondary | ICD-10-CM | POA: Diagnosis present

## 2022-06-05 DIAGNOSIS — D72828 Other elevated white blood cell count: Secondary | ICD-10-CM | POA: Diagnosis present

## 2022-06-05 DIAGNOSIS — I495 Sick sinus syndrome: Secondary | ICD-10-CM | POA: Diagnosis present

## 2022-06-05 DIAGNOSIS — E1151 Type 2 diabetes mellitus with diabetic peripheral angiopathy without gangrene: Secondary | ICD-10-CM | POA: Diagnosis present

## 2022-06-05 DIAGNOSIS — Z950 Presence of cardiac pacemaker: Secondary | ICD-10-CM | POA: Diagnosis not present

## 2022-06-05 DIAGNOSIS — I3139 Other pericardial effusion (noninflammatory): Secondary | ICD-10-CM | POA: Diagnosis present

## 2022-06-05 DIAGNOSIS — Z681 Body mass index (BMI) 19 or less, adult: Secondary | ICD-10-CM | POA: Diagnosis not present

## 2022-06-05 DIAGNOSIS — Z7901 Long term (current) use of anticoagulants: Secondary | ICD-10-CM | POA: Diagnosis not present

## 2022-06-05 DIAGNOSIS — I2510 Atherosclerotic heart disease of native coronary artery without angina pectoris: Secondary | ICD-10-CM | POA: Diagnosis present

## 2022-06-05 DIAGNOSIS — Z66 Do not resuscitate: Secondary | ICD-10-CM | POA: Diagnosis present

## 2022-06-05 DIAGNOSIS — J690 Pneumonitis due to inhalation of food and vomit: Secondary | ICD-10-CM | POA: Diagnosis present

## 2022-06-05 DIAGNOSIS — Z79890 Hormone replacement therapy (postmenopausal): Secondary | ICD-10-CM | POA: Diagnosis not present

## 2022-06-05 DIAGNOSIS — N1832 Chronic kidney disease, stage 3b: Secondary | ICD-10-CM | POA: Diagnosis present

## 2022-06-05 DIAGNOSIS — I5033 Acute on chronic diastolic (congestive) heart failure: Secondary | ICD-10-CM | POA: Diagnosis present

## 2022-06-05 DIAGNOSIS — J9621 Acute and chronic respiratory failure with hypoxia: Secondary | ICD-10-CM | POA: Diagnosis present

## 2022-06-05 DIAGNOSIS — N179 Acute kidney failure, unspecified: Secondary | ICD-10-CM | POA: Diagnosis present

## 2022-06-05 LAB — CBC
HCT: 40.4 % (ref 36.0–46.0)
Hemoglobin: 12.9 g/dL (ref 12.0–15.0)
MCH: 28.4 pg (ref 26.0–34.0)
MCHC: 31.9 g/dL (ref 30.0–36.0)
MCV: 89 fL (ref 80.0–100.0)
Platelets: 213 10*3/uL (ref 150–400)
RBC: 4.54 MIL/uL (ref 3.87–5.11)
RDW: 18.2 % — ABNORMAL HIGH (ref 11.5–15.5)
WBC: 14.9 10*3/uL — ABNORMAL HIGH (ref 4.0–10.5)
nRBC: 0 % (ref 0.0–0.2)

## 2022-06-05 LAB — RESPIRATORY PANEL BY PCR

## 2022-06-05 LAB — COMPREHENSIVE METABOLIC PANEL
ALT: 19 U/L (ref 0–44)
AST: 26 U/L (ref 15–41)
Albumin: 3.3 g/dL — ABNORMAL LOW (ref 3.5–5.0)
Alkaline Phosphatase: 168 U/L — ABNORMAL HIGH (ref 38–126)
Anion gap: 8 (ref 5–15)
BUN: 33 mg/dL — ABNORMAL HIGH (ref 8–23)
CO2: 29 mmol/L (ref 22–32)
Calcium: 9 mg/dL (ref 8.9–10.3)
Chloride: 98 mmol/L (ref 98–111)
Creatinine, Ser: 1.26 mg/dL — ABNORMAL HIGH (ref 0.44–1.00)
GFR, Estimated: 42 mL/min — ABNORMAL LOW (ref >60)
Glucose, Bld: 140 mg/dL — ABNORMAL HIGH (ref 70–99)
Potassium: 4.3 mmol/L (ref 3.5–5.1)
Sodium: 135 mmol/L (ref 135–145)
Total Bilirubin: 2.2 mg/dL — ABNORMAL HIGH (ref 0.3–1.2)
Total Protein: 7 g/dL (ref 6.5–8.1)

## 2022-06-05 LAB — GLUCOSE, CAPILLARY
Glucose-Capillary: 133 mg/dL — ABNORMAL HIGH (ref 70–99)
Glucose-Capillary: 250 mg/dL — ABNORMAL HIGH (ref 70–99)
Glucose-Capillary: 211 mg/dL — ABNORMAL HIGH (ref 70–99)
Glucose-Capillary: 137 mg/dL — ABNORMAL HIGH (ref 70–99)

## 2022-06-05 LAB — PROCALCITONIN: Procalcitonin: 0.43 ng/mL

## 2022-06-05 LAB — D-DIMER, QUANTITATIVE: D-Dimer, Quant: 3.37 ug/mL-FEU — ABNORMAL HIGH (ref 0.00–0.50)

## 2022-06-05 LAB — T4, FREE: Free T4: 1.5 ng/dL — ABNORMAL HIGH (ref 0.61–1.12)

## 2022-06-05 MED ORDER — SODIUM CHLORIDE 0.9 % IV SOLN
1.0000 g | INTRAVENOUS | Status: DC
  Administered 2022-06-05 – 2022-06-06 (×2): 1 g via INTRAVENOUS
  Filled 2022-06-05 (×2): qty 10

## 2022-06-05 MED ORDER — SODIUM CHLORIDE 0.9 % IV SOLN
500.0000 mg | INTRAVENOUS | Status: DC
  Administered 2022-06-05 – 2022-06-06 (×2): 500 mg via INTRAVENOUS
  Filled 2022-06-05 (×2): qty 5

## 2022-06-05 NOTE — TOC Initial Note (Signed)
Transition of Care [facility]) - Initial/Assessment Note    Patient Details  Name: Veronica Young MRN: [id number] Date of Birth: 06-20-1933  Transition of Care [facility]) CM/SW Contact:    Leeroy Cha, RN Phone Number: 06/05/2022, 7:55 AM  Clinical Narrative:                 Patient readmitted.  Chf. Will need heart failure screening will send to [facility].  [facility] home pt and [facility].  Lives with daughterf.  Expected Discharge Plan: [place] Barriers to Discharge: Continued Medical Work up   Patient Goals and CMS Choice Patient states their goals for this hospitalization and ongoing recovery are:: to return home with daughter CMS Medicare.gov Compare Post Acute Care list provided to:: Patient Choice offered to / list presented to : Patient, Adult Children  Expected Discharge Plan and Services Expected Discharge Plan: [place]   Discharge Planning Services: CM Consult Post Acute Care Choice: [place] arrangements for the past 2 months: Single Family Home                                      Prior Living Arrangements/Services Living arrangements for the past 2 months: Single Family Home Lives with:: Adult Children Patient language and need for interpreter reviewed:: Yes Do you feel safe going back to the place where you live?: Yes      Need for Family Participation in Patient Care: Yes (Comment) Care giver support system in place?: Yes (comment)   Criminal Activity/Legal Involvement Pertinent to Current Situation/Hospitalization: No - Comment as needed  Activities of Daily Living      Permission Sought/Granted                  Emotional Assessment Appearance:: Appears stated age     Orientation: : Oriented to Self, Oriented to Place, Oriented to  Time, Oriented to Situation Alcohol / Substance Use: Not Applicable Psych Involvement: No (comment)  Admission diagnosis:  Acute  exacerbation of CHF (congestive heart failure) ([place]) [I50.9] Patient Active Problem List   Diagnosis Date Noted   Acute exacerbation of CHF (congestive heart failure) ([place]) 06/08/2022   Hypokalemia 17/40/8144   Acute metabolic encephalopathy 81/85/6314   Protein-calorie malnutrition, severe 05/22/2022   Anxiety 05/22/2022   T2DM (type 2 diabetes mellitus) ([place]) 05/22/2022   Hypothyroidism 05/22/2022   Congenital deafness 05/21/2022   Depression 01/17/2022   Hyponatremia 01/16/2022   Chronic respiratory failure with hypoxia ([place]) - has home O2 01/14/2022   Malnutrition of moderate degree 01/11/2022   Acute kidney injury superimposed on chronic kidney disease ([place]) 05/24/2020   Acute on chronic diastolic CHF (congestive heart failure) ([place]) 04/02/2020   Acute on chronic respiratory failure with hypoxia ([place]) 04/02/2020   CAD in native artery 06/16/2017   AF (paroxysmal atrial fibrillation) ([place]) 06/16/2017   Sinus node dysfunction ([place]) 09/10/2016   HTN (hypertension) 09/22/2014   Hypertensive urgency 09/15/2014   Chest pain 09/15/2014   Chronic diastolic CHF (congestive heart failure) ([place]) 11/27/2012   PAD (peripheral artery disease) ([place]) 03/18/2012   Claudication ([place]) 02/19/2012   Atrial tachycardia ([place]) 06/27/2011   Hypercholesterolemia    Deafness    Carotid bruit    GERD (gastroesophageal reflux disease)    PCP:  Katherina Mires, MD Pharmacy:   [id number] - [place], [place]  [place] [address] Phone: [phone] Fax: [phone]  [facility] [place] [place] [address] Phone: [phone] Fax: [phone]     Social Determinants of Health (SDOH) Interventions    Readmission Risk Interventions     No data to display

## 2022-06-05 NOTE — Progress Notes (Signed)
PROGRESS NOTE    Veronica Young  LKG:[id number] DOB: 1933-09-05 DOA: 06/03/2022 PCP: Katherina Mires, MD   Brief Narrative: 88 year old female lives at home with her family she is deaf and uses interpreter to communicate.  She has congenital deafness.  She has a history of congestive heart failure diabetes diastolic heart failure atrial fibrillation and sick sinus syndrome.  She was admitted with shortness of breath and chest pain which is pleuritic in nature.  Chest x-ray showed pleural effusion and elevated BNP.  She denied fever or cough.  Echo last week with severe LVH. She is on 4 L of oxygen at baseline. Her BNP was 923 on admission. Chest x-ray showed bilateral pleural effusions small cardiomegaly.  Possible underlying pneumonia. Assessment & Plan:   Principal Problem:   Acute exacerbation of CHF (congestive heart failure) (HCC) Active Problems:   Acute on chronic diastolic CHF (congestive heart failure) (HCC)   Chest pain   Acute on chronic respiratory failure with hypoxia (HCC)   Acute kidney injury superimposed on chronic kidney disease (HCC)   T2DM (type 2 diabetes mellitus) (HCC)   HTN (hypertension)   AF (paroxysmal atrial fibrillation) (HCC)   Malnutrition of moderate degree   Hypokalemia   Acute on chronic hypoxic respiratory failure due to acute diastolic heart failure-appreciate cardiology input She was on 5 L of oxygen titrate down as tolerated Continue Lasix IV 40 mg Ins I's and O's Daily weights  Leukocytosis patient admitted with pleuritic chest pain and shortness of breath with chest x-ray possible pneumonia and with worsening leukocytosis not on antibiotics.  I will place her on Rocephin azithromycin for 5 days.    Permanent atrial fibrillation on Eliquis and metoprolol CHA2DS2-VASc score is high at 7  O2 dependent COPD  Stage IIIb CKD baseline creatinine 1.3  Type 2 diabetes-continue SSI CBG (last 3)  Recent Labs    05/31/2022 2326 06/05/22 0723  06/05/22 1138  GLUCAP 140* 133* 250*     History of essential hypertension on metoprolol 25 twice daily    Hypothyroidism on Synthroid  Estimated body mass index is 18.78 kg/m as calculated from the following:   Height as of this encounter: 5\' 5"  (1.651 m).   Weight as of this encounter: 51.2 kg.  DVT prophylaxis: Eliquis  code Status: DNR Family Communication: None at bedside Disposition Plan:  Status is: Inpatient  Remains inpatient appropriate because: Acute hypoxia   Consultants:  Cardiology  Procedures: None Antimicrobials: Rocephin azithromycin  Subjective: Patient resting in bed Seen patient with interpreter patient reports her pain is worse with breathing or coughing on the left side  Objective: Vitals:   06/05/22 0017 06/05/22 0422 06/05/22 0437 06/05/22 1335  BP: 134/81 (!) 149/76  116/81  Pulse:  74  89  Resp: 20 (!) 22    Temp: 100.1 F (37.8 C) 99 F (37.2 C)  98.3 F (36.8 C)  TempSrc: Axillary   Oral  SpO2: 93% 95%  94%  Weight:   51.2 kg   Height:        Intake/Output Summary (Last 24 hours) at 06/05/2022 1553 Last data filed at 06/05/2022 1300 Gross per 24 hour  Intake 787.04 ml  Output 250 ml  Net 537.04 ml   Filed Weights   06/01/2022 1138 06/02/2022 2002 06/05/22 0437  Weight: 50.7 kg 50.2 kg 51.2 kg    Examination:  General exam: Appears calm and comfortable  Respiratory system diminished to auscultation. Respiratory effort normal. Cardiovascular system: S1 &  S2 heard, RRR. No JVD, murmurs, rubs, gallops or clicks. No pedal edema. Gastrointestinal system: Abdomen is nondistended, soft and nontender. No organomegaly or masses felt. Normal bowel sounds heard. Central nervous system: Alert and oriented. No focal neurological deficits. Extremities: 1+ edema Skin: No rashes, lesions or ulcers Psychiatry: Judgement and insight appear normal. Mood & affect appropriate.     Data Reviewed: I have personally reviewed following labs and  imaging studies  CBC: Recent Labs  Lab 05/26/2022 1158 06/05/22 0420  WBC 9.8 14.9*  HGB 13.8 12.9  HCT 42.8 40.4  MCV 89.4 89.0  PLT 218 097   Basic Metabolic Panel: Recent Labs  Lab 06/21/2022 1158 06/18/2022 2213 06/05/22 0420  NA 135 135 135  K 3.9 3.1* 4.3  CL 96* 98 98  CO2 30 29 29   GLUCOSE 199* 149* 140*  BUN 32* 31* 33*  CREATININE 1.48* 1.17* 1.26*  CALCIUM 9.2 8.7* 9.0  MG  --  2.3  --   PHOS  --  2.5  --    GFR: Estimated Creatinine Clearance: 25.9 mL/min (A) (by C-G formula based on SCr of 1.26 mg/dL (H)). Liver Function Tests: Recent Labs  Lab 06/08/2022 2213 06/05/22 0420  AST 31 26  ALT 19 19  ALKPHOS 174* 168*  BILITOT 1.7* 2.2*  PROT 7.2 7.0  ALBUMIN 3.2* 3.3*   No results for input(s): "LIPASE", "AMYLASE" in the last 168 hours. No results for input(s): "AMMONIA" in the last 168 hours. Coagulation Profile: No results for input(s): "INR", "PROTIME" in the last 168 hours. Cardiac Enzymes: Recent Labs  Lab 05/23/2022 2213  CKTOTAL 38   BNP (last 3 results) No results for input(s): "PROBNP" in the last 8760 hours. HbA1C: No results for input(s): "HGBA1C" in the last 72 hours. CBG: Recent Labs  Lab 06/14/2022 2326 06/05/22 0723 06/05/22 1138  GLUCAP 140* 133* 250*   Lipid Profile: No results for input(s): "CHOL", "HDL", "LDLCALC", "TRIG", "CHOLHDL", "LDLDIRECT" in the last 72 hours. Thyroid Function Tests: Recent Labs    06/05/22 0420  FREET4 1.50*   Anemia Panel: No results for input(s): "VITAMINB12", "FOLATE", "FERRITIN", "TIBC", "IRON", "RETICCTPCT" in the last 72 hours. Sepsis Labs: Recent Labs  Lab 06/01/2022 2213 06/05/22 0420  PROCALCITON 0.29 0.43    Recent Results (from the past 240 hour(s))  Respiratory (~20 pathogens) panel by PCR     Status: None   Collection Time: 06/05/22 12:16 AM   Specimen: Nasopharyngeal Swab; Respiratory  Result Value Ref Range Status   Adenovirus NOT DETECTED NOT DETECTED Final   Coronavirus  229E NOT DETECTED NOT DETECTED Final    Comment: (NOTE) The Coronavirus on the Respiratory Panel, DOES NOT test for the novel  Coronavirus (2019 nCoV)    Coronavirus HKU1 NOT DETECTED NOT DETECTED Final   Coronavirus NL63 NOT DETECTED NOT DETECTED Final   Coronavirus OC43 NOT DETECTED NOT DETECTED Final   Metapneumovirus NOT DETECTED NOT DETECTED Final   Rhinovirus / Enterovirus NOT DETECTED NOT DETECTED Final   Influenza A NOT DETECTED NOT DETECTED Final   Influenza B NOT DETECTED NOT DETECTED Final   Parainfluenza Virus 1 NOT DETECTED NOT DETECTED Final   Parainfluenza Virus 2 NOT DETECTED NOT DETECTED Final   Parainfluenza Virus 3 NOT DETECTED NOT DETECTED Final   Parainfluenza Virus 4 NOT DETECTED NOT DETECTED Final   Respiratory Syncytial Virus NOT DETECTED NOT DETECTED Final   Bordetella pertussis NOT DETECTED NOT DETECTED Final   Bordetella Parapertussis NOT DETECTED NOT DETECTED Final  Chlamydophila pneumoniae NOT DETECTED NOT DETECTED Final   Mycoplasma pneumoniae NOT DETECTED NOT DETECTED Final    Comment: Performed at [facility], [place] [address] [place], [place] [address]         Radiology Studies: DG Chest Port 1 View  Result Date: 05/28/2022 CLINICAL DATA:  Chest pain, nausea EXAM: PORTABLE CHEST 1 VIEW COMPARISON:  Previous chest radiographs done on 05/23/2022 and CT done on 05/24/2022 FINDINGS: Transverse diameter of heart is increased. Apparent shift of mediastinum to the right may be due to rotation. Pacemaker battery is seen in the left infraclavicular region. There is blunting of both lateral CP angles suggesting small bilateral effusions, more so on the right side. Possibility of underlying subsegmental atelectasis/pneumonia is not excluded. IMPRESSION: Cardiomegaly. Small bilateral pleural effusions, more so on the right side. There are no signs of alveolar pulmonary edema. Increased density in both lower lung fields may be due to pleural effusion and  possibly underlying atelectasis/pneumonia. Electronically Signed   By: Elmer Picker M.D.   On: 06/08/2022 12:25        Scheduled Meds:  apixaban  2.5 mg Oral BID   brimonidine  1 drop Both Eyes BID   dorzolamide-timolol  1 drop Both Eyes BID   escitalopram  10 mg Oral Daily   feeding supplement  237 mL Oral BID BM   furosemide  40 mg Intravenous Daily   guaiFENesin  600 mg Oral BID   insulin aspart  0-9 Units Subcutaneous TID WC   latanoprost  1 drop Both Eyes QHS   levothyroxine  175 mcg Oral Q0600   metoprolol tartrate  25 mg Oral BID   sodium chloride flush  3 mL Intravenous Q12H   Continuous Infusions:  sodium chloride       LOS: 0 days    Time spent: 39 minutes    Georgette Shell, MD 06/05/2022, 3:53 PM

## 2022-06-05 NOTE — Consult Note (Signed)
Cardiology Consultation:   Patient ID: Veronica Young MRN: [id number]; DOB: 09-Jun-1933  Admit date: 05/31/2022 Date of Consult: 06/05/2022  PCP:  Katherina Mires, MD   [facility] Providers Cardiologist:  Peter [name], MD  Electrophysiologist:  Cristopher Peru, MD  {  Patient Profile:   Veronica Young is a 88 y.o. female with a hx of congenital deafness, CKD 3b, HFpEF, pulmonary hypertension, permanent Afib with SSS s/p PPM, CAD, HTN, HLD, DM2, PAD, and chronic respiratory failure on 4L O2 at home who is being seen 06/05/2022 for the evaluation of dyspnea and chest pain at the request of Dr. Rodena Piety.  History of Present Illness:   [name] with the above PMH has followed with cardiology for the above. She had a heart catheterization 09/28/2004 with moderate nonobstructive CAD. Stress test 2014 was normal. Dual chamber PPM placed for symptomatic bradycardia in 2015.   She was hospitalized 01/08/22-01/12/22 for acute hypovolemic AKI, abdominal pain, N/V. She improved with IVF and was discharged. Presented back to the ER 01/13/22 with weakness and leg swelling felt related to HFpEF. She was admitted for acute on chronic diastolic heart failure. Echo 01/14/22 with LVEF 60-65%, mild LVH, severe TR (could not rule out that pacing lead was tenting one of the tricuspid leaflets), and severe pulmonary hypertension. Restrictive disease prompted PYP scan 01/25/22 that showed normal study, no evidence of amyloidosis. Right heart cath was deferred during that hospitalization due to frailty and age. She was seen in the office 04/22/22 for follow and was stable.   Hospitalized again 05/20/22-05/27/22 with acute on chronic diastolic heart failure and pulmonary hypertension. She reported chest tightness worse with deep inspiration. Acute on chronic respiratory failure on arrival in ER, weaned from 7L back down to 4L, home is usually 2L.   Echo 05/21/22 with severe LVH, severely dilated atria bilaterally, midl MR, no AS,  trivial pericardial effusion.   CT chest 05/24/22: cardiomegaly, coronary artery calcifications, moderate-large bilateral pleural effusions, and increase in size of pericardial effusion.   Limited echo 05/25/22: moderate pleural effusion, severely elevated pulmonary artery pressure, trivial pericardial effusion.  She was diuresed and discharged on 40 mg lasix BID, which was her home dose.   Unfortunately, she presented back to the [facility] 06/17/2022 with N/V, chest pain worse with deep inspiration. On 4L [place] at baseline.   Workup significant for: HST negative x 2 BNP 923 (691) Hb 13.8 (11.5) sCr 1.17 --> 1.26 K 3.1 --> 4.3 Mg 2.3 Alk phos 174 --> 168 D-dimer 3.37 T4 1.50  CXR: cardiomegaly, small bilateral pleural effusions, no edema, possible underlying PNA.  She was admitted for acute on chronic respiratory failure with possible CHF exacerbation. Labs above significant for negative CE and elevated BNP. Do  not think her chest pain is related to ACS.     Past Medical History:  Diagnosis Date   Carotid bruit    CHF (congestive heart failure) (HCC)    Deafness    Diabetes mellitus    type 2   Diastolic heart failure (HCC)    EF is 60%   GERD (gastroesophageal reflux disease)    Glaucoma    Hypercholesterolemia    Hypertension    Hypothyroidism    Normal cardiac stress test January 2014   PAD (peripheral artery disease) ([place])    a. known bilateral popliteal occlusions with conservative management favored.   PAT (paroxysmal atrial tachycardia) (HCC)    Sick sinus syndrome (HCC)    a. s/p PPM placement  07/2014    Past Surgical History:  Procedure Laterality Date   cholycystectomy     left breast cyst removed     PACEMAKER INSERTION  07-26-2014   MDT ADDRL1 pacemaker implanted by Dr Lovena Le for SSS   PERMANENT PACEMAKER INSERTION N/A 07/26/2014   Procedure: PERMANENT PACEMAKER INSERTION;  Surgeon: Evans Lance, MD;  Location[facility] CATH LAB;  Service: Cardiovascular;  Laterality:  N/A;   TOTAL KNEE ARTHROPLASTY     right     Home Medications:  Prior to Admission medications   Medication Sig Start Date End Date Taking? Authorizing Provider  acetaminophen (TYLENOL) 500 MG tablet Take 1,000 mg by mouth every 6 (six) hours as needed for moderate pain.   Yes [provider]  apixaban (ELIQUIS) 2.5 MG TABS tablet TAKE 1 TABLET TWICE DAILY Patient taking differently: Take 2.5 mg by mouth 2 (two) times daily. 05/27/22  Yes Almyra Deforest, PA  brimonidine (ALPHAGAN) 0.2 % ophthalmic solution Place 1 drop into both eyes 2 (two) times daily. 05/20/17  Yes [provider]  calcium-vitamin D (OSCAL WITH D) 500-200 MG-UNIT per tablet Take 1 tablet by mouth daily with breakfast.   Yes [provider]  cholecalciferol (VITAMIN D) 1000 UNITS tablet Take 1,000 Units by mouth daily.   Yes [provider]  dorzolamide-timolol (COSOPT) 22.3-6.8 MG/ML ophthalmic solution Place 1 drop into both eyes 2 (two) times daily.   Yes [provider]  escitalopram (LEXAPRO) 10 MG tablet Take 10 mg by mouth daily. 11/19/21  Yes [provider]  feeding supplement (ENSURE ENLIVE / ENSURE PLUS) LIQD Take 237 mLs by mouth 3 (three) times daily between meals. Patient taking differently: Take 237 mLs by mouth 2 (two) times daily between meals. 01/12/22  Yes Pahwani, Rinka R, MD  furosemide (LASIX) 40 MG tablet Take 1 tablet (40 mg total) by mouth 2 (two) times daily. Patient taking differently: Take 40 mg by mouth daily. 05/27/22  Yes Patrecia Pour, MD  latanoprost (XALATAN) 0.005 % ophthalmic solution Place 1 drop into both eyes at bedtime. 11/25/15  Yes [provider]  levothyroxine (SYNTHROID) 175 MCG tablet Take 175 mcg by mouth daily before breakfast.   Yes [provider]  metoprolol tartrate (LOPRESSOR) 25 MG tablet TAKE 1 TABLET (25 MG TOTAL) BY MOUTH 2 (TWO) TIMES DAILY. Patient taking differently: Take 25 mg by mouth 2 (two) times daily.  02/04/22  Yes [name], Peter M, MD  Multiple Vitamin (MULTIVITAMIN WITH MINERALS) TABS tablet Take 1 tablet by mouth daily. 01/12/22  Yes Pahwani, Rinka R, MD  potassium chloride SA (KLOR-CON M) 20 MEQ tablet Take 1 tablet (20 mEq total) by mouth daily. 02/01/22  Yes [name], Peter M, MD  triamcinolone ointment (KENALOG) 0.5 % Apply 1 application. topically 2 (two) times daily. 04/01/22  Yes [provider]  gabapentin (NEURONTIN) 100 MG capsule Take 100 mg by mouth at bedtime. Patient not taking: Reported on 06/07/2022 10/24/19   [provider]  OXYGEN Inhale into the lungs. 2 liters as needed    [provider]  RHOPRESSA 0.02 % SOLN Place 1 drop into the left eye at bedtime. Patient not taking: Reported on 05/26/2022 10/28/19   [provider]    Inpatient Medications: Scheduled Meds:  apixaban  2.5 mg Oral BID   brimonidine  1 drop Both Eyes BID   dorzolamide-timolol  1 drop Both Eyes BID   escitalopram  10 mg Oral Daily   feeding supplement  237 mL Oral BID BM   furosemide  40 mg Intravenous Daily   guaiFENesin  600 mg Oral BID   insulin aspart  0-9 Units Subcutaneous TID WC   latanoprost  1 drop Both Eyes QHS   levothyroxine  175 mcg Oral Q0600   metoprolol tartrate  25 mg Oral BID   sodium chloride flush  3 mL Intravenous Q12H   Continuous Infusions:  sodium chloride     PRN Meds: sodium chloride, acetaminophen **OR** acetaminophen, albuterol, HYDROcodone-acetaminophen, sodium chloride flush  Allergies:    Allergies  Allergen Reactions   Amlodipine Nausea Only        Benicar [Olmesartan Medoxomil] Nausea Only    Social History:   Social History   Socioeconomic History   Marital status: Widowed    Spouse name: Not on file   Number of children: 4   Years of education: Not on file   Highest education level: Not on file  Occupational History   Not on file  Tobacco Use   Smoking status: Never   Smokeless tobacco: Never  Substance and  Sexual Activity   Alcohol use: No   Drug use: No   Sexual activity: Never  Other Topics Concern   Not on file  Social History Narrative   Not on file   Social Determinants of Health   Financial Resource Strain: Not on file  Food Insecurity: Not on file  Transportation Needs: Not on file  Physical Activity: Not on file  Stress: Not on file  Social Connections: Not on file  Intimate Partner Violence: Not on file    Family History:    Family History  Problem Relation Age of Onset   Heart disease Mother    Liver cancer Father    Heart disease Sister    Heart disease Brother    Stroke Sister      ROS:  Please see the history of present illness.   All other ROS reviewed and negative.     Physical Exam/Data:   Vitals:   05/26/2022 2002 06/05/22 0017 06/05/22 0422 06/05/22 0437  BP: (!) 143/86 134/81 (!) 149/76   Pulse: 83  74   Resp: (!) 22 20 (!) 22   Temp: 98.5 F (36.9 C) 100.1 F (37.8 C) 99 F (37.2 C)   TempSrc: Oral Axillary    SpO2: 92% 93% 95%   Weight: 50.2 kg   51.2 kg  Height: 5' 5" (1.651 m)       Intake/Output Summary (Last 24 hours) at 06/05/2022 0841 Last data filed at 06/05/2022 0820 Gross per 24 hour  Intake 193.04 ml  Output 250 ml  Net -56.96 ml      06/05/2022    4:37 AM 06/13/2022    8:02 PM 06/03/2022   11:38 AM  Last 3 Weights  Weight (lbs) 112 lb 14 oz 110 lb 10.7 oz 111 lb 12.4 oz  Weight (kg) 51.2 kg 50.2 kg 50.7 kg     Body mass index is 18.78 kg/m.  General:  Frail in no acute distress HEENT: normal Neck: positive JVD Vascular: No carotid bruits; Distal pulses 2+ bilaterally Cardiac:  normal S1, S2; irregular Lungs: Diminished breath sounds bases right greater than left Abd: soft, nontender, no hepatomegaly  Ext: 1+ ankle edema Musculoskeletal:  No deformities, BUE and BLE strength normal and equal Skin: warm and dry  Neuro:  CNs 2-12 intact, no focal abnormalities noted Psych:  Normal affect   EKG:  The EKG was  personally reviewed and demonstrates:  afib with VR 83, ST changes laterally are old Telemetry:  Telemetry was personally reviewed and demonstrates:  rate controlled Afib ventricular rate 70-80s  Relevant CV Studies:  Echo limited 05/25/22: 1. Left ventricular ejection fraction, by estimation, is 65 to 70%. The  left ventricle has normal function. There is severe left ventricular  hypertrophy.   2. Left atrial size was severely dilated.   3. Right atrial size was severely dilated.   4. Moderate pleural effusion.   5. Tricuspid valve regurgitation is moderate to severe.   6. Aortic valve regurgitation is not visualized.   7. There is severely elevated pulmonary artery systolic pressure.   Comparison(s): No significant change from prior study.   Conclusion(s)/Recommendation(s): No significant pericardial effusion.    Echo 05/21/22:  1. Compared to echo from Jan 2023, no significant change in LVEF and RVEF.   2. Left ventricular ejection fraction, by estimation, is >75%. The left  ventricle has hyperdynamic function. The left ventricle has no regional  wall motion abnormalities. There is severe concentric left ventricular  hypertrophy. Left ventricular  diastolic parameters are indeterminate.   3. Right ventricular systolic function is moderately reduced. The right  ventricular size is moderately enlarged.   4. Left atrial size was severely dilated.   5. Right atrial size was severely dilated.   6. The mitral valve is normal in structure. Mild mitral valve  regurgitation.   7. The aortic valve is tricuspid. Aortic valve regurgitation is not  visualized. Aortic valve sclerosis is present, with no evidence of aortic  valve stenosis.   8. The inferior vena cava is dilated in size with <50% respiratory  variability, suggesting right atrial pressure of 15 mmHg.    Laboratory Data:  High Sensitivity Troponin:   Recent Labs  Lab 05/20/22 1742 05/20/22 1942 06/07/2022 1150  06/14/2022 1358  TROPONINIHS _0 Chemistry Recent Labs  Lab 06/05/2022 1158 06/15/2022 2213 06/05/22 0420  NA 135 135 135  K 3.9 3.1* 4.3  CL 96* 98 98  CO2 _1 GLUCOSE 199* 149* 140*  BUN 32* 31* 33*  CREATININE 1.48* 1.17* 1.26*  CALCIUM 9.2 8.7* 9.0  MG  --  2.3  --   GFRNONAA 34* 45* 42*  ANIONGAP _2 Recent Labs  Lab 05/31/2022 2213 06/05/22 0420  PROT 7.2 7.0  ALBUMIN 3.2* 3.3*  AST 31 26  ALT 19 19  ALKPHOS 174* 168*  BILITOT 1.7* 2.2*   Lipids No results for input(s): "CHOL", "TRIG", "HDL", "LABVLDL", "LDLCALC", "CHOLHDL" in the last 168 hours.  Hematology Recent Labs  Lab 06/01/2022 1158 06/05/22 0420  WBC 9.8 14.9*  RBC 4.79 4.54  HGB 13.8 12.9  HCT 42.8 40.4  MCV 89.4 89.0  MCH 28.8 28.4  MCHC 32.2 31.9  RDW 18.4* 18.2*  PLT 218 213   Thyroid  Recent Labs  Lab 06/05/22 0420  FREET4 1.50*    BNP Recent Labs  Lab 06/15/2022 1150  BNP 922.7*    DDimer  Recent Labs  Lab 06/05/22 0420  DDIMER 3.37*     Radiology/Studies:  DG Chest Port 1 View  Result Date: 06/14/2022 CLINICAL DATA:  Chest pain, nausea EXAM: PORTABLE CHEST 1 VIEW COMPARISON:  Previous chest radiographs done on 05/23/2022 and CT done on 05/24/2022 FINDINGS: Transverse diameter of heart is increased. Apparent shift of mediastinum to the right may be due to rotation. Pacemaker battery  is seen in the left infraclavicular region. There is blunting of both lateral CP angles suggesting small bilateral effusions, more so on the right side. Possibility of underlying subsegmental atelectasis/pneumonia is not excluded. IMPRESSION: Cardiomegaly. Small bilateral pleural effusions, more so on the right side. There are no signs of alveolar pulmonary edema. Increased density in both lower lung fields may be due to pleural effusion and possibly underlying atelectasis/pneumonia. Electronically Signed   By: Elmer Picker M.D.   On: 05/31/2022 12:25     Assessment and Plan:    Acute on chronic respiratory failure Acute on chronic diastolic heart failure Severe pulmonary hypertension CKD stage IIIa - baseline 1.3 - has received 40 mg IV lasix x 2 doses - 250 cc urine output charted   Trivial pericardial effusion Chest pain worse with deep inspiration HST negative EKG with Afib Question if CP is related to volume   Permanent Atrial fibrillation - rate controlled Symptomatic bradycardia s/p dual chamber PPM Chronic anticoagulation - stable Afib, not currently pacing - no signs of active bleeding on eliquis - continue metoprolol   For questions or updates, please contact [facility] Please consult [email or web address] for contact info under    Signed, Ledora Bottcher, PA  06/05/2022 8:41 AM As above, patient seen and examined.  Briefly she is an 88 year old female with past medical history of congenital deafness, chronic stage IIIb kidney disease, chronic diastolic congestive heart failure, pulmonary hypertension, permanent atrial fibrillation, previous pacemaker, hypertension, hyperlipidemia, diabetes mellitus, chronic respiratory failure on home oxygen, coronary artery disease for evaluation of CHF and chest pain.  Assistance with interpreter is used for this evaluation.  Patient complains of chest pain.  It increases with inspiration and also palpation of her chest.  She has chronic dyspnea but denies increased recently.  Last echocardiogram June 2023 showed normal LV function, severe biatrial enlargement, moderate pleural effusion, moderate to severe tricuspid regurgitation and severe pulmonary hypertension.  She has been hospitalized recent with acute on chronic diastolic congestive heart failure.  She was admitted and is being diuresed.  Cardiology asked to evaluate.  Chest x-ray shows bilateral pleural effusions right greater than left and cardiomegaly.  BUN 33 and creatinine 1.26.  BNP 922.  Electrocardiogram shows atrial fibrillation with nonspecific  inferolateral T wave changes.  Troponin I is 15 and 15.  1 chest pain-symptoms increase with cough and reproduced with palpation.  Likely musculoskeletal.  Troponins normal.  No plans for further ischemia evaluation.  Note she is very frail and has multiple medical problems including severe home O2 dependent COPD, chronic stage IIIb kidney disease and therefore aggressive cardiac evaluation likely not indicated.  2 acute on chronic diastolic congestive heart failure-we will continue Lasix at present dose and likely transition to oral Lasix in the next 24 to 48 hours.    3 permanent atrial fibrillation-continue metoprolol for rate control.  Continue apixaban.  CHA2DS2-VASc is 7.  4 chronic home O2 dependent COPD-management per primary service.  5 history of pacemaker  6 chronic stage IIIb kidney disease-follow renal function closely with Lasix.  Kirk Ruths, MD

## 2022-06-05 NOTE — Evaluation (Signed)
Occupational Therapy Evaluation Patient Details Name: Veronica Young MRN: [id number] DOB: [birth year] Today's Date: 06/05/2022   History of Present Illness Veronica Young is a 88 y.o. female with medical history significant of CHF, diabetes, diastolic heart failure, hypertension, hyperlipidemia, peripheral arterial arterial disease, paroxysmal atrial tachycardia, persistent atrial fibrillation on Eliquis, sick sinus syndrome with a pacemaker placed in 2015, Presented with this of breath chest pain.  She was just admitted 2 weeks ago for chest pain CHF and pulmonary hypertension at baseline she is on 4 L of nasal cannula.  Of note patient is with congenital deafness and requires sign language to communicate. Chest x-ray showed pleural effusion and elevated BNP   Clinical Impression   Veronica Young is a n 88 year old woman admitted to hospital with above medical history. On evaluation she presents on 7 L [place] and is drowsy and fatigued. She required overall min assist for transfer out of bed, standing and transfer to recliner and BSC. She is min assist for ADLs. She had one loss of balance transferring back to recliner. Patient will benefit from skilled OT services while in hospital to improve deficits and learn compensatory strategies as needed in order to return to PLOF.  Able to titrate oxygen down to 5 L [place] with o2 sat maintaining at 93%. Patient currently receiving [place] therapy at home. Recommend return home with therapy and family assistance.      Recommendations for follow up therapy are one component of a multi-disciplinary discharge planning process, led by the attending physician.  Recommendations may be updated based on patient status, additional functional criteria and insurance authorization.   Follow Up Recommendations  Home health OT    Assistance Recommended at Discharge Frequent or constant Supervision/Assistance  Patient can return home with the following A little help with  bathing/dressing/bathroom;Assist for transportation;A little help with walking and/or transfers;Assistance with cooking/housework;Help with stairs or ramp for entrance    Functional Status Assessment  Patient has had a recent decline in their functional status and demonstrates the ability to make significant improvements in function in a reasonable and predictable amount of time.  Equipment Recommendations  None recommended by OT    Recommendations for Other Services       Precautions / Restrictions Precautions Precautions: Fall Restrictions Weight Bearing Restrictions: No      Mobility Bed Mobility Overal bed mobility: Needs Assistance Bed Mobility: Supine to Sit     Supine to sit: HOB elevated, Min assist     General bed mobility comments: min assist for hand hold to pull herself up in to sitting. increased time.    Transfers Overall transfer level: Needs assistance Equipment used: 1 person hand held assist Transfers: Bed to chair/wheelchair/BSC Sit to Stand: Min assist     Step pivot transfers: Min assist            Balance Overall balance assessment: Needs assistance Sitting-balance support: No upper extremity supported, Feet supported Sitting balance-Leahy Scale: Fair Sitting balance - Comments: static sitting without assist     Standing balance-Leahy Scale: Fair Standing balance comment: able to stand statically - but had one LOB                           ADL either performed or assessed with clinical judgement   ADL Overall ADL's : Needs assistance/impaired     Grooming: Set up;Sitting   Upper Body Bathing: Set up;Sitting   Lower  Body Bathing: Minimal assistance;Sit to/from stand   Upper Body Dressing : Set up;Sitting   Lower Body Dressing: Minimal assistance;Sit to/from stand   Toilet Transfer: Minimal assistance;BSC/3in1 Armed forces technical officer Details (indicate cue type and reason): able to take steps without a device to [facility] for  toileting. Min assist for steadying. Toileting- Clothing Manipulation and Hygiene: Minimal assistance;Sit to/from stand Toileting - Clothing Manipulation Details (indicate cue type and reason): able to wipe in seated position, but min assist for quality.     Functional mobility during ADLs: Minimal assistance General ADL Comments: Min assist to steady to take steps to bsc and recliner.     Vision Baseline Vision/History: 1 Wears glasses;3 Glaucoma Vision Assessment?: No apparent visual deficits Additional Comments: blind in left eye?     Perception     Praxis      Pertinent Vitals/Pain Pain Assessment Pain Assessment: No/denies pain     Hand Dominance Right   Extremity/Trunk Assessment Upper Extremity Assessment Upper Extremity Assessment: Overall WFL for tasks assessed   Lower Extremity Assessment Lower Extremity Assessment: Defer to PT evaluation   Cervical / Trunk Assessment Cervical / Trunk Assessment: Normal   Communication Communication Communication: Interpreter utilized;Deaf   Cognition Arousal/Alertness: Awake/alert Behavior During Therapy: WFL for tasks assessed/performed Overall Cognitive Status: Within Functional Limits for tasks assessed                                 General Comments: Patient drowsy this a.m.but able to read lips and follow commands     General Comments       Exercises     Shoulder Instructions      Home Living Family/patient expects to be discharged to:: Private residence Living Arrangements: Children Available Help at Discharge: Family;Available 24 hours/day Type of Home: House Home Access: Stairs to enter [employer] of Steps: 1   Home Layout: One level     Bathroom Shower/Tub: Teacher, early years/pre: Standard     Home Equipment: None   Additional Comments: daughter lives with her. Has been receiving OT/PT at home.      Prior Functioning/Environment                Mobility Comments: Patient  states she has a RW for at home use ADLs Comments: assist for homemaking and driving, bathes and dresses on her own.  Cares for her own meds.        OT Problem List: Decreased activity tolerance;Impaired balance (sitting and/or standing);Decreased safety awareness;Decreased strength;Cardiopulmonary status limiting activity      OT Treatment/Interventions: Self-care/ADL training;Therapeutic activities;Patient/family education;Balance training;Energy conservation;DME and/or AE instruction    OT Goals(Current goals can be found in the care plan section) Acute Rehab OT Goals Patient Stated Goal: get stronger OT Goal Formulation: With patient/family Time For Goal Achievement: 06/20/22 Potential to Achieve Goals: Good  OT Frequency: Min 2X/week    Co-evaluation              AM-PAC OT "6 Clicks" Daily Activity     Outcome Measure Help from another person eating meals?: A Little Help from another person taking care of personal grooming?: A Little Help from another person toileting, which includes using toliet, bedpan, or urinal?: A Little Help from another person bathing (including washing, rinsing, drying)?: A Little Help from another person to put on and taking off regular upper body clothing?: A Little Help from another person to put  on and taking off regular lower body clothing?: A Little 6 Click Score: 18   End of Session Equipment Utilized During Treatment: Oxygen Nurse Communication: Mobility status (decreased [place] to 5 L)  Activity Tolerance: Patient limited by fatigue Patient left: in chair;with call bell/phone within reach;with family/visitor present;with chair alarm set  OT Visit Diagnosis: Muscle weakness (generalized) (M62.81)                Time: 2763-9432 OT Time Calculation (min): 28 min Charges:  OT General Charges $OT Visit: 1 Visit OT Evaluation $OT Eval Low Complexity: 1 Low OT Treatments $Self Care/Home Management : 8-22  mins  Yara Tomkinson, OTR/[place]  Office [phone] Pager: [place] 06/05/2022, 9:53 AM

## 2022-06-05 NOTE — Evaluation (Signed)
Physical Therapy Evaluation Patient Details Name: Veronica Young MRN: [id number] DOB: 04/29/1933 Today's Date: 06/05/2022  History of Present Illness  Veronica Young is a 88 y.o. female with medical history significant of CHF, diabetes, diastolic heart failure, hypertension, hyperlipidemia, peripheral arterial arterial disease, paroxysmal atrial tachycardia, persistent atrial fibrillation on Eliquis, sick sinus syndrome with a pacemaker placed in 2015, Presented with this of breath chest pain.  She was just admitted 2 weeks ago for chest pain CHF and pulmonary hypertension at baseline she is on 4 L of nasal cannula.  Of note patient is with congenital deafness and requires sign language to communicate. Chest x-ray showed pleural effusion and elevated BNP    Clinical Impression  Veronica Young is 88 y.o. female admitted with above HPI and diagnosis. Patient is currently limited by functional impairments below (see PT problem list). Patient lives with her daughters and is independent with RW PTA and working with [facility] PT/OT. Currently pt requires min assist for transfers and gait with RW and was limited by fatigue/decreased endurance and ambulated ~15' prior to requiring break. Patient will benefit from continued skilled PT interventions to address impairments and progress independence with mobility, recommending HHPT continue when pt discharges home with assist from family. Acute PT will follow and progress as able.        Recommendations for follow up therapy are one component of a multi-disciplinary discharge planning process, led by the attending physician.  Recommendations may be updated based on patient status, additional functional criteria and insurance authorization.  Follow Up Recommendations Home health PT    Assistance Recommended at Discharge Frequent or constant Supervision/Assistance  Patient can return home with the following  A little help with walking and/or transfers;A little help  with bathing/dressing/bathroom;Assistance with cooking/housework;Assist for transportation;Direct supervision/assist for medications management;Help with stairs or ramp for entrance    Equipment Recommendations None recommended by PT  Recommendations for Other Services       Functional Status Assessment Patient has had a recent decline in their functional status and demonstrates the ability to make significant improvements in function in a reasonable and predictable amount of time.     Precautions / Restrictions Precautions Precautions: Fall Restrictions Weight Bearing Restrictions: No      Mobility  Bed Mobility               General bed mobility comments: pt OOB in recliner    Transfers Overall transfer level: Needs assistance Equipment used: 1 person hand held assist Transfers: Sit to/from Stand Sit to Stand: Min assist           General transfer comment: min assust to rise safely from recliner, pt using bil UE for powerup.    Ambulation/Gait Ambulation/Gait assistance: Min assist Gait Distance (Feet): 15 Feet Assistive device: Rolling walker (2 wheels) Gait Pattern/deviations: Step-through pattern, Decreased stride length, Decreased weight shift to right, Narrow base of support Gait velocity: decr        Stairs            Wheelchair Mobility    Modified Rankin (Stroke Patients Only)       Balance Overall balance assessment: Needs assistance Sitting-balance support: No upper extremity supported, Feet supported Sitting balance-Leahy Scale: Fair Sitting balance - Comments: static sitting without assist Postural control: Posterior lean, Right lateral lean Standing balance support: During functional activity, Bilateral upper extremity supported, Reliant on assistive device for balance Standing balance-Leahy Scale: Poor Standing balance comment: reliant on RW, Rt lean  Pertinent Vitals/Pain Pain  Assessment Pain Assessment: No/denies pain    Home Living Family/patient expects to be discharged to:: Private residence Living Arrangements: Children Available Help at Discharge: Family;Available 24 hours/day Type of Home: House Home Access: Stairs to enter Entrance Stairs-Rails: None Entrance Stairs-Number of Steps: 1   Home Layout: One level Home Equipment: Conservation officer, nature (2 wheels) Additional Comments: daughter lives with her. Has been receiving OT/PT at home.    Prior Function Prior Level of Function : Needs assist       Physical Assist : ADLs (physical)     Mobility Comments: Patient  states she has a RW for at home use ADLs Comments: assist for homemaking and driving, bathes and dresses on her own.  Cares for her own meds.     Hand Dominance   Dominant Hand: Right    Extremity/Trunk Assessment   Upper Extremity Assessment Upper Extremity Assessment: Overall WFL for tasks assessed;Defer to OT evaluation    Lower Extremity Assessment Lower Extremity Assessment: Generalized weakness RLE Deficits / Details: Rt knee limited due to prior TKA, ~15* flexion contracture, unbale to fully extend    Cervical / Trunk Assessment Cervical / Trunk Assessment: Normal  Communication   Communication: Interpreter utilized;Deaf  Cognition Arousal/Alertness: Awake/alert Behavior During Therapy: WFL for tasks assessed/performed Overall Cognitive Status: Within Functional Limits for tasks assessed                                 General Comments: cues needed to pay attention to ASL interpreter        General Comments      Exercises     Assessment/Plan    PT Assessment Patient needs continued PT services  PT Problem List Decreased strength;Decreased range of motion;Decreased activity tolerance;Decreased balance;Decreased mobility;Decreased safety awareness;Decreased knowledge of use of DME;Decreased knowledge of precautions;Cardiopulmonary status limiting  activity       PT Treatment Interventions DME instruction;Gait training;Balance training;Therapeutic exercise;Stair training;Functional mobility training;Patient/family education;Therapeutic activities    PT Goals (Current goals can be found in the Care Plan section)  Acute Rehab PT Goals Patient Stated Goal: return home, watch tv, walk PT Goal Formulation: With patient Time For Goal Achievement: 2022/07/06 Potential to Achieve Goals: Good    Frequency Min 3X/week     Co-evaluation               AM-PAC PT "6 Clicks" Mobility  Outcome Measure Help needed turning from your back to your side while in a flat bed without using bedrails?: A Little Help needed moving from lying on your back to sitting on the side of a flat bed without using bedrails?: A Little Help needed moving to and from a bed to a chair (including a wheelchair)?: A Little Help needed standing up from a chair using your arms (e.g., wheelchair or bedside chair)?: A Little Help needed to walk in hospital room?: A Little Help needed climbing 3-5 steps with a railing? : A Lot 6 Click Score: 17    End of Session Equipment Utilized During Treatment: Gait belt;Oxygen Activity Tolerance: Patient tolerated treatment well Patient left: in chair;with call bell/phone within reach;with chair alarm set;with family/visitor present;Other (comment) (ASL interpretter on line) Nurse Communication: Mobility status PT Visit Diagnosis: Other abnormalities of gait and mobility (R26.89);Unsteadiness on feet (R26.81);Difficulty in walking, not elsewhere classified (R26.2);Muscle weakness (generalized) (M62.81)    Time: 7425-9563 PT Time Calculation (min) (ACUTE ONLY): 27 min  Charges:   PT Evaluation $PT Eval Moderate Complexity: 1 Mod PT Treatments $Gait Training: 8-22 mins        Verner Mould, DPT Acute Rehabilitation Services Office [phone] Pager [phone]  06/05/22 3:52 PM

## 2022-06-06 ENCOUNTER — Other Ambulatory Visit: Payer: Self-pay

## 2022-06-06 DIAGNOSIS — I5033 Acute on chronic diastolic (congestive) heart failure: Secondary | ICD-10-CM | POA: Diagnosis not present

## 2022-06-06 LAB — BASIC METABOLIC PANEL
Anion gap: 9 (ref 5–15)
BUN: 48 mg/dL — ABNORMAL HIGH (ref 8–23)
CO2: 25 mmol/L (ref 22–32)
Calcium: 8.8 mg/dL — ABNORMAL LOW (ref 8.9–10.3)
Chloride: 99 mmol/L (ref 98–111)
Creatinine, Ser: 1.53 mg/dL — ABNORMAL HIGH (ref 0.44–1.00)
GFR, Estimated: 33 mL/min — ABNORMAL LOW (ref >60)
Glucose, Bld: 130 mg/dL — ABNORMAL HIGH (ref 70–99)
Potassium: 4.5 mmol/L (ref 3.5–5.1)
Sodium: 133 mmol/L — ABNORMAL LOW (ref 135–145)

## 2022-06-06 LAB — T3: T3, Total: 63 ng/dL — ABNORMAL LOW (ref 71–180)

## 2022-06-06 LAB — CBC
HCT: 39.8 % (ref 36.0–46.0)
Hemoglobin: 12.9 g/dL (ref 12.0–15.0)
MCH: 29 pg (ref 26.0–34.0)
MCHC: 32.4 g/dL (ref 30.0–36.0)
MCV: 89.4 fL (ref 80.0–100.0)
Platelets: 200 10*3/uL (ref 150–400)
RBC: 4.45 MIL/uL (ref 3.87–5.11)
RDW: 18.7 % — ABNORMAL HIGH (ref 11.5–15.5)
WBC: 20.1 10*3/uL — ABNORMAL HIGH (ref 4.0–10.5)
nRBC: 0 % (ref 0.0–0.2)

## 2022-06-06 LAB — GLUCOSE, CAPILLARY
Glucose-Capillary: 158 mg/dL — ABNORMAL HIGH (ref 70–99)
Glucose-Capillary: 207 mg/dL — ABNORMAL HIGH (ref 70–99)
Glucose-Capillary: 186 mg/dL — ABNORMAL HIGH (ref 70–99)
Glucose-Capillary: 197 mg/dL — ABNORMAL HIGH (ref 70–99)

## 2022-06-06 LAB — PROCALCITONIN: Procalcitonin: 3.48 ng/mL

## 2022-06-06 LAB — MAGNESIUM: Magnesium: 2.2 mg/dL (ref 1.7–2.4)

## 2022-06-06 MED ORDER — ENSURE ENLIVE PO LIQD: 237.0000 mL | Freq: Three times a day (TID) | ORAL | Status: DC

## 2022-06-06 MED ORDER — FUROSEMIDE 40 MG PO TABS
40.0000 mg | ORAL_TABLET | Freq: Two times a day (BID) | ORAL | Status: DC
  Administered 2022-06-06 (×2): 40 mg via ORAL
  Filled 2022-06-06: qty 1

## 2022-06-18 ENCOUNTER — Ambulatory Visit: Payer: Medicare HMO | Admitting: Physician Assistant

## 2022-06-20 ENCOUNTER — Other Ambulatory Visit: Payer: Self-pay | Admitting: Internal Medicine

## 2022-06-22 NOTE — Progress Notes (Signed)
Pt's daughter called the RN to the room that the pt is not doing well. RN immediately went in the room, and found the pt trying to spit out, and looking weak. Rn called for help, grab the vital machine to check pt vital signs. Rapid was also called to the bed side, No data from the vital machine, pt was suction but nothing really came out. BS checked and was 197. Pt was hooked to the  zoll,no pulse detected. The attending was also notified, and she came to the bedside immediately. Pt was a DNR. [place] serviced notified.

## 2022-06-22 NOTE — Progress Notes (Signed)
PROGRESS NOTE    Veronica Young  PQD:[id number] DOB: August 24, 1933 DOA: 06/20/2022 PCP: Katherina Mires, MD   Brief Narrative: 88 year old female lives at home with her family she is deaf and uses interpreter to communicate.  She has congenital deafness.  She has a history of congestive heart failure diabetes diastolic heart failure atrial fibrillation and sick sinus syndrome.  She was admitted with shortness of breath and chest pain which is pleuritic in nature.  Chest x-ray showed pleural effusion and elevated BNP.  She denied fever or cough.  Echo last week with severe LVH. She is on 4 L of oxygen at baseline. Her BNP was 923 on admission. Chest x-ray showed bilateral pleural effusions small cardiomegaly.  Possible underlying pneumonia. Assessment & Plan:   Principal Problem:   Acute exacerbation of CHF (congestive heart failure) (HCC) Active Problems:   Acute on chronic diastolic CHF (congestive heart failure) (HCC)   Chest pain   Acute on chronic respiratory failure with hypoxia (HCC)   Acute kidney injury superimposed on chronic kidney disease (HCC)   T2DM (type 2 diabetes mellitus) (HCC)   HTN (hypertension)   AF (paroxysmal atrial fibrillation) (HCC)   Malnutrition of moderate degree   Hypokalemia   Acute on chronic hypoxic respiratory failure due to acute diastolic heart failure-appreciate cardiology input recommending Lasix 40 mg IV twice daily.  Output not recorded.  She is 94% on 5 L nasal cannula. Discussed with patient's daughter [name] at bedside today patient has appointment with palliative care on 06/20/2022.  Leukocytosis patient admitted with pleuritic chest pain and shortness of breath with chest x-ray possible pneumonia and with worsening leukocytosis not on antibiotics.  Patient had a temp overnight.  Continue Rocephin azithromycin.    Permanent atrial fibrillation on Eliquis and metoprolol CHA2DS2-VASc score is high at 7  O2 dependent COPD  Stage IIIb CKD baseline  creatinine 1.3  Type 2 diabetes-continue SSI CBG (last 3)  Recent Labs    06/05/22 1947 13-Jun-2022 0741 Jun 13, 2022 1149  GLUCAP 137* 158* 207*    History of essential hypertension on metoprolol 25 twice daily    Hypothyroidism on Synthroid  Estimated body mass index is 18.56 kg/m as calculated from the following:   Height as of this encounter: 5\' 5"  (1.651 m).   Weight as of this encounter: 50.6 kg.  DVT prophylaxis: Eliquis  code Status: DNR Family Communication: None at bedside Disposition Plan:  Status is: Inpatient  Remains inpatient appropriate because: Acute hypoxia   Consultants:  Cardiology  Procedures: None Antimicrobials: Rocephin azithromycin  Subjective: Patient resting in bed Seen patient with interpreter patient reports her pain is worse with breathing or coughing on the left side  Objective: Vitals:   2022/06/13 0500 06-13-2022 0650 06-13-2022 0919 June 13, 2022 1306  BP: 119/68 94/62 (!) 102/59 (!) 97/56  Pulse: 96 100 76 75  Resp: (!) 26 (!) 31 (!) 24 19  Temp: 99.2 F (37.3 C) 98.1 F (36.7 C) 97.9 F (36.6 C) 97.6 F (36.4 C)  TempSrc: Oral  Oral Oral  SpO2: 90% 91% 96% 94%  Weight:      Height:        Intake/Output Summary (Last 24 hours) at Jun 13, 2022 1328 Last data filed at 06-13-2022 0300 Gross per 24 hour  Intake 927.03 ml  Output --  Net 927.03 ml    Filed Weights   06/05/2022 2002 06/05/22 0437 06/13/2022 0427  Weight: 50.2 kg 51.2 kg 50.6 kg    Examination:  General exam:  Appears calm and comfortable  Respiratory system diminished to auscultation. Respiratory effort normal. Cardiovascular system: S1 & S2 heard, RRR. No JVD, murmurs, rubs, gallops or clicks. No pedal edema. Gastrointestinal system: Abdomen is nondistended, soft and nontender. No organomegaly or masses felt. Normal bowel sounds heard. Central nervous system: Alert and oriented. No focal neurological deficits. Extremities: 1+ edema Skin: No rashes, lesions or  ulcers Psychiatry: Judgement and insight appear normal. Mood & affect appropriate.     Data Reviewed: I have personally reviewed following labs and imaging studies  CBC: Recent Labs  Lab 06/16/2022 1158 06/05/22 0420 06/16/2022 0410  WBC 9.8 14.9* 20.1*  HGB 13.8 12.9 12.9  HCT 42.8 40.4 39.8  MCV 89.4 89.0 89.4  PLT 218 213 761    Basic Metabolic Panel: Recent Labs  Lab 06/12/2022 1158 05/26/2022 2213 06/05/22 0420 2022-06-16 0410  NA 135 135 135 133*  K 3.9 3.1* 4.3 4.5  CL 96* 98 98 99  CO2 30 29 29 25   GLUCOSE 199* 149* 140* 130*  BUN 32* 31* 33* 48*  CREATININE 1.48* 1.17* 1.26* 1.53*  CALCIUM 9.2 8.7* 9.0 8.8*  MG  --  2.3  --  2.2  PHOS  --  2.5  --   --     GFR: Estimated Creatinine Clearance: 21.1 mL/min (A) (by C-G formula based on SCr of 1.53 mg/dL (H)). Liver Function Tests: Recent Labs  Lab 05/26/2022 2213 06/05/22 0420  AST 31 26  ALT 19 19  ALKPHOS 174* 168*  BILITOT 1.7* 2.2*  PROT 7.2 7.0  ALBUMIN 3.2* 3.3*    No results for input(s): "LIPASE", "AMYLASE" in the last 168 hours. No results for input(s): "AMMONIA" in the last 168 hours. Coagulation Profile: No results for input(s): "INR", "PROTIME" in the last 168 hours. Cardiac Enzymes: Recent Labs  Lab 06/15/2022 2213  CKTOTAL 38    BNP (last 3 results) No results for input(s): "PROBNP" in the last 8760 hours. HbA1C: No results for input(s): "HGBA1C" in the last 72 hours. CBG: Recent Labs  Lab 06/05/22 1138 06/05/22 1712 06/05/22 1947 06/16/22 0741 2022-06-16 1149  GLUCAP 250* 211* 137* 158* 207*    Lipid Profile: No results for input(s): "CHOL", "HDL", "LDLCALC", "TRIG", "CHOLHDL", "LDLDIRECT" in the last 72 hours. Thyroid Function Tests: Recent Labs    06/05/22 0420  FREET4 1.50*    Anemia Panel: No results for input(s): "VITAMINB12", "FOLATE", "FERRITIN", "TIBC", "IRON", "RETICCTPCT" in the last 72 hours. Sepsis Labs: Recent Labs  Lab 06/03/2022 2213 06/05/22 0420  June 16, 2022 0411  PROCALCITON 0.29 0.43 3.48     Recent Results (from the past 240 hour(s))  Respiratory (~20 pathogens) panel by PCR     Status: None   Collection Time: 06/05/22 12:16 AM   Specimen: Nasopharyngeal Swab; Respiratory  Result Value Ref Range Status   Adenovirus NOT DETECTED NOT DETECTED Final   Coronavirus 229E NOT DETECTED NOT DETECTED Final    Comment: (NOTE) The Coronavirus on the Respiratory Panel, DOES NOT test for the novel  Coronavirus (2019 nCoV)    Coronavirus HKU1 NOT DETECTED NOT DETECTED Final   Coronavirus NL63 NOT DETECTED NOT DETECTED Final   Coronavirus OC43 NOT DETECTED NOT DETECTED Final   Metapneumovirus NOT DETECTED NOT DETECTED Final   Rhinovirus / Enterovirus NOT DETECTED NOT DETECTED Final   Influenza A NOT DETECTED NOT DETECTED Final   Influenza B NOT DETECTED NOT DETECTED Final   Parainfluenza Virus 1 NOT DETECTED NOT DETECTED Final   Parainfluenza Virus  2 NOT DETECTED NOT DETECTED Final   Parainfluenza Virus 3 NOT DETECTED NOT DETECTED Final   Parainfluenza Virus 4 NOT DETECTED NOT DETECTED Final   Respiratory Syncytial Virus NOT DETECTED NOT DETECTED Final   Bordetella pertussis NOT DETECTED NOT DETECTED Final   Bordetella Parapertussis NOT DETECTED NOT DETECTED Final   Chlamydophila pneumoniae NOT DETECTED NOT DETECTED Final   Mycoplasma pneumoniae NOT DETECTED NOT DETECTED Final    Comment: Performed at [facility], [place] [address], [place], [place] [address]         Radiology Studies: No results found.      Scheduled Meds:  apixaban  2.5 mg Oral BID   brimonidine  1 drop Both Eyes BID   dorzolamide-timolol  1 drop Both Eyes BID   escitalopram  10 mg Oral Daily   feeding supplement  237 mL Oral BID BM   furosemide  40 mg Oral BID   guaiFENesin  600 mg Oral BID   insulin aspart  0-9 Units Subcutaneous TID WC   latanoprost  1 drop Both Eyes QHS   levothyroxine  175 mcg Oral Q0600   metoprolol tartrate  25 mg Oral  BID   sodium chloride flush  3 mL Intravenous Q12H   Continuous Infusions:  sodium chloride     azithromycin 500 mg (06/05/22 1742)   cefTRIAXone (ROCEPHIN)  IV 1 g (06/05/22 1656)     LOS: 1 day    Time spent: 39 minutes    Georgette Shell, MD Jun 16, 2022, 1:28 PM

## 2022-06-22 NOTE — Progress Notes (Signed)
Progress Note  Patient Name: Veronica Young Date of Encounter: 2022/06/30  [facility] Cardiologist: Peter [name], MD   Subjective   No dyspnea; chest pain with inspiration and palpation  Inpatient Medications    Scheduled Meds:  apixaban  2.5 mg Oral BID   brimonidine  1 drop Both Eyes BID   dorzolamide-timolol  1 drop Both Eyes BID   escitalopram  10 mg Oral Daily   feeding supplement  237 mL Oral BID BM   furosemide  40 mg Intravenous Daily   guaiFENesin  600 mg Oral BID   insulin aspart  0-9 Units Subcutaneous TID WC   latanoprost  1 drop Both Eyes QHS   levothyroxine  175 mcg Oral Q0600   metoprolol tartrate  25 mg Oral BID   sodium chloride flush  3 mL Intravenous Q12H   Continuous Infusions:  sodium chloride     azithromycin 500 mg (06/05/22 1742)   cefTRIAXone (ROCEPHIN)  IV 1 g (06/05/22 1656)   PRN Meds: sodium chloride, acetaminophen **OR** acetaminophen, albuterol, HYDROcodone-acetaminophen, sodium chloride flush   Vital Signs    Vitals:   06/05/22 2206 30-Jun-2022 0427 2022-06-30 0500 06/30/22 0650  BP:   119/68 94/62  Pulse:   96 100  Resp:   (!) 26 (!) 31  Temp: 98.2 F (36.8 C)  99.2 F (37.3 C) 98.1 F (36.7 C)  TempSrc: Oral  Oral   SpO2:   90% 91%  Weight:  50.6 kg    Height:        Intake/Output Summary (Last 24 hours) at 2022/06/30 0846 Last data filed at 30-Jun-2022 0300 Gross per 24 hour  Intake 1164.03 ml  Output --  Net 1164.03 ml      30-Jun-2022    4:27 AM 06/05/2022    4:37 AM 06/09/2022    8:02 PM  Last 3 Weights  Weight (lbs) 111 lb 8.8 oz 112 lb 14 oz 110 lb 10.7 oz  Weight (kg) 50.6 kg 51.2 kg 50.2 kg      Telemetry    Atrial fibrillation - Personally Reviewed  Physical Exam   GEN: No acute distress.  Frail Neck: No JVD Cardiac: irregular Respiratory: Diminished BS bases GI: Soft, nontender, non-distended  MS: No edema Neuro:  Nonfocal  Psych: Normal affect   Labs    High Sensitivity Troponin:   Recent  Labs  Lab 05/20/22 1742 05/20/22 1942 05/25/2022 1150 06/16/2022 1358  TROPONINIHS 15 16 15 15      Chemistry Recent Labs  Lab 05/24/2022 2213 06/05/22 0420 06-30-2022 0410  NA 135 135 133*  K 3.1* 4.3 4.5  CL 98 98 99  CO2 29 29 25   GLUCOSE 149* 140* 130*  BUN 31* 33* 48*  CREATININE 1.17* 1.26* 1.53*  CALCIUM 8.7* 9.0 8.8*  MG 2.3  --  2.2  PROT 7.2 7.0  --   ALBUMIN 3.2* 3.3*  --   AST 31 26  --   ALT 19 19  --   ALKPHOS 174* 168*  --   BILITOT 1.7* 2.2*  --   GFRNONAA 45* 42* 33*  ANIONGAP 8 8 9      Hematology Recent Labs  Lab 06/13/2022 1158 06/05/22 0420 06/30/22 0410  WBC 9.8 14.9* 20.1*  RBC 4.79 4.54 4.45  HGB 13.8 12.9 12.9  HCT 42.8 40.4 39.8  MCV 89.4 89.0 89.4  MCH 28.8 28.4 29.0  MCHC 32.2 31.9 32.4  RDW 18.4* 18.2* 18.7*  PLT 218 213 200  Thyroid  Recent Labs  Lab 06/05/22 0420  FREET4 1.50*    BNP Recent Labs  Lab 06/05/2022 1150  BNP 922.7*    DDimer  Recent Labs  Lab 06/05/22 0420  DDIMER 3.37*     Radiology    DG Chest Port 1 View  Result Date: 06/13/2022 CLINICAL DATA:  Chest pain, nausea EXAM: PORTABLE CHEST 1 VIEW COMPARISON:  Previous chest radiographs done on 05/23/2022 and CT done on 05/24/2022 FINDINGS: Transverse diameter of heart is increased. Apparent shift of mediastinum to the right may be due to rotation. Pacemaker battery is seen in the left infraclavicular region. There is blunting of both lateral CP angles suggesting small bilateral effusions, more so on the right side. Possibility of underlying subsegmental atelectasis/pneumonia is not excluded. IMPRESSION: Cardiomegaly. Small bilateral pleural effusions, more so on the right side. There are no signs of alveolar pulmonary edema. Increased density in both lower lung fields may be due to pleural effusion and possibly underlying atelectasis/pneumonia. Electronically Signed   By: Elmer Picker M.D.   On: 06/19/2022 12:25     Patient Profile     88 year old female  with past medical history of congenital deafness, chronic stage IIIb kidney disease, chronic diastolic congestive heart failure, pulmonary hypertension, permanent atrial fibrillation, previous pacemaker, hypertension, hyperlipidemia, diabetes mellitus, chronic respiratory failure on home oxygen, coronary artery disease for evaluation of CHF and chest pain.  Last echocardiogram June 2023 showed normal LV function, severe biatrial enlargement, moderate pleural effusion, moderate to severe tricuspid regurgitation and severe pulmonary hypertension.  Cardiology asked to evaluate.    Assessment & Plan    1 chest pain-enzymes negative.  Chest pain increases with inspiration and palpation.  Likely musculoskeletal or related to possible pneumonia.  I do not think it is cardiac pain and we have no plans for further ischemia evaluation.     2 acute on chronic diastolic congestive heart failure-patient appears to be euvolemic on examination.  Will resume home dose of Lasix 40 mg by mouth twice daily.  Would follow renal function.   3 permanent atrial fibrillation-continue metoprolol for rate control.  Continue apixaban.  CHA2DS2-VASc is 7.   4 chronic home O2 dependent COPD-management per primary service.   5 history of pacemaker   6 chronic stage IIIb kidney disease-follow renal function closely with Lasix.  7 pneumonia-antibiotics initiated by primary care.  Cardiology will sign off.  Would continue present medications at discharge.  Will arrange follow-up with APP 2 weeks after discharge.  Follow-up with Dr. [name] 3 months.  Please call with questions.  For questions or updates, please contact [place] Please consult [email or web address] for contact info under        Signed, Kirk Ruths, MD  06/22/2022, 8:46 AM

## 2022-06-22 NOTE — Progress Notes (Incomplete)
   2022/06/25 0500  Assess: MEWS Score  Temp 99.2 F (37.3 C)  BP 119/68  MAP (mmHg) 81  Pulse Rate 96  ECG Heart Rate 99  Resp (!) 26  Level of Consciousness Alert  SpO2 90 %  O2 Device Nasal Cannula  Assess: MEWS Score  MEWS Temp 0  MEWS Systolic 0  MEWS Pulse 0  MEWS RR 2  MEWS LOC 0  MEWS Score 2  MEWS Score Color Yellow  Assess: if the MEWS score is Yellow or Red  Were vital signs taken at a resting state? Yes  Focused Assessment No change from prior assessment  Does the patient meet 2 or more of the SIRS criteria? No  MEWS guidelines implemented *See Row Information* No, vital signs rechecked  Treat  MEWS Interventions Administered prn meds/treatments;Escalated (See documentation below)  Pain Scale 0-10  Pain Score 0  Take Vital Signs  Increase Vital Sign Frequency  Yellow: Q 2hr X 2 then Q 4hr X 2, if remains yellow, continue Q 4hrs  Escalate  MEWS: Escalate Yellow: discuss with charge nurse/RN and consider discussing with provider and RRT  Notify: Charge Nurse/RN  Name of Charge Nurse/RN Notified Duffy Rhody, RN  Date Charge Nurse/RN Notified 25-Jun-2022  Time Charge Nurse/RN Notified 0600  Notify: Provider  Provider Name/Title J. Olena Heckle, NP  Date Provider Notified Jun 25, 2022  Time Provider Notified [phone]  Method of Notification  (secure chat)  Provider response No new orders (Give PRN (Tylenol))  Date of Provider Response 2022-06-25  Time of Provider Response 0521  Assess: SIRS CRITERIA  SIRS Temperature  0  SIRS Pulse 1  SIRS Respirations  1  SIRS WBC 0  SIRS Score Sum  2

## 2022-06-22 NOTE — Progress Notes (Signed)
Initial Nutrition Assessment RD working remotely.  DOCUMENTATION CODES:   Not applicable  INTERVENTION:  - will increase Ensure Enlive from BID to TID, each supplement provides 350 kcal and 20 grams of protein. - complete NFPE when feasible.    NUTRITION DIAGNOSIS:   Increased nutrient needs related to acute illness as evidenced by estimated needs.  GOAL:   Patient will meet greater than or equal to 90% of their needs  MONITOR:   PO intake, Supplement acceptance, Labs, Weight trends  REASON FOR ASSESSMENT:   Malnutrition Screening Tool, Consult Assessment of nutrition requirement/status  ASSESSMENT:   88 year old female with medical history of HTN, DM, hypercholesterolemia, deafness, GERD, glaucoma, hypothyroidism, PAD, diastolic CHF, and sick sinus syndrome. She presented to the ED due to shortness of breath and chest pain. In the ED, CXR showed pleural effusion and cardiomegaly. She had an ECHO a week prior which showed severe LVH. She was also noted to have possible underlying PNA.  RD working off campus. Patient is noted to be deaf and use Sign Language for communication.   She was seen by another [place] RD on 5/30. At that time, patient met criteria for severe malnutrition related to chronic illness (CHF) as evidenced by severe muscle depletion, severe fat depletion. Suspect this is ongoing.  Flow sheet documentation indicates patient ate 25% of breakfast and 50% of lunch yesterday.   Ensure ordered BID and she has been accepting this supplement 100% of the time offered.  Weight today is 111 lb and weight on 5/1 was 114 lb which indicates 3 lb weight loss (1.6% body weight) in 1.5 months. Weight on 02/01/22 was 126 lb which indicates 15 lb weight loss (12% body weight) in 4 months; significant for time frame.   Per notes: - acute on chronic respiratory failure d/t acute CHF  - possible PNA - leukocytosis   Labs reviewed; CBGs: 158 and 207 mg/dl, Na: 133  mmol/l, BUN: 48 mg/dl, creatinine: 1.53 mg/dl, Ca: 8.8 mg/dl, GFR: 33 ml/min.  Medications reviewed; 40 mg oral lasix BID, sliding scale novolog, 175 mcg oral synthroid/day,     NUTRITION - FOCUSED PHYSICAL EXAM:  Unable at this time.  Diet Order:   Diet Order             Diet Carb Modified Fluid consistency: Thin; Room service appropriate? Yes  Diet effective now                   EDUCATION NEEDS:   Not appropriate for education at this time  Skin:  Skin Assessment: Skin Integrity Issues: Skin Integrity Issues:: Other (Comment) Other: MASD to coccyx  Last BM:  PTA/unknown  Height:   Ht Readings from Last 1 Encounters:  06/15/2022 5' 5"  (1.651 m)    Weight:   Wt Readings from Last 1 Encounters:  07/02/2022 50.6 kg     BMI:  Body mass index is 18.56 kg/m.  Estimated Nutritional Needs:  Kcal:  1600-1800 kcal Protein:  85-100 grams Fluid:  >/= 1.6 L/day      Jarome Matin, MS, RD, LDN Registered Dietitian II Inpatient Clinical Nutrition RD pager # and on-call/weekend pager # available in [facility]

## 2022-06-22 DEATH — deceased

## 2022-09-02 ENCOUNTER — Ambulatory Visit: Payer: Medicare HMO | Admitting: Cardiology
